# Patient Record
Sex: Female | Born: 1950
Health system: Southern US, Community
[De-identification: ages and names within clinical notes are randomized; demographics above are authoritative.]

## PROBLEM LIST (undated history)

## (undated) DIAGNOSIS — I10 Essential (primary) hypertension: Secondary | ICD-10-CM

## (undated) DIAGNOSIS — N6009 Solitary cyst of unspecified breast: Secondary | ICD-10-CM

## (undated) DIAGNOSIS — Z720 Tobacco use: Secondary | ICD-10-CM

## (undated) DIAGNOSIS — R011 Cardiac murmur, unspecified: Secondary | ICD-10-CM

## (undated) DIAGNOSIS — L401 Generalized pustular psoriasis: Secondary | ICD-10-CM

## (undated) DIAGNOSIS — Z8042 Family history of malignant neoplasm of prostate: Secondary | ICD-10-CM

## (undated) DIAGNOSIS — H409 Unspecified glaucoma: Secondary | ICD-10-CM

## (undated) DIAGNOSIS — Z803 Family history of malignant neoplasm of breast: Secondary | ICD-10-CM

## (undated) DIAGNOSIS — E785 Hyperlipidemia, unspecified: Secondary | ICD-10-CM

## (undated) DIAGNOSIS — D496 Neoplasm of unspecified behavior of brain: Secondary | ICD-10-CM

## (undated) DIAGNOSIS — E042 Nontoxic multinodular goiter: Secondary | ICD-10-CM

## (undated) DIAGNOSIS — I739 Peripheral vascular disease, unspecified: Secondary | ICD-10-CM

## (undated) DIAGNOSIS — Z801 Family history of malignant neoplasm of trachea, bronchus and lung: Secondary | ICD-10-CM

## (undated) DIAGNOSIS — M1990 Unspecified osteoarthritis, unspecified site: Secondary | ICD-10-CM

## (undated) DIAGNOSIS — Z8051 Family history of malignant neoplasm of kidney: Secondary | ICD-10-CM

## (undated) DIAGNOSIS — D689 Coagulation defect, unspecified: Secondary | ICD-10-CM

## (undated) DIAGNOSIS — T7840XA Allergy, unspecified, initial encounter: Secondary | ICD-10-CM

## (undated) DIAGNOSIS — Z8489 Family history of other specified conditions: Secondary | ICD-10-CM

## (undated) DIAGNOSIS — F329 Major depressive disorder, single episode, unspecified: Secondary | ICD-10-CM

## (undated) DIAGNOSIS — H269 Unspecified cataract: Secondary | ICD-10-CM

## (undated) DIAGNOSIS — K219 Gastro-esophageal reflux disease without esophagitis: Secondary | ICD-10-CM

## (undated) DIAGNOSIS — Z800 Family history of malignant neoplasm of digestive organs: Secondary | ICD-10-CM

## (undated) DIAGNOSIS — Z90711 Acquired absence of uterus with remaining cervical stump: Secondary | ICD-10-CM

## (undated) DIAGNOSIS — Z1509 Genetic susceptibility to other malignant neoplasm: Secondary | ICD-10-CM

## (undated) DIAGNOSIS — I2510 Atherosclerotic heart disease of native coronary artery without angina pectoris: Secondary | ICD-10-CM

## (undated) DIAGNOSIS — Z8049 Family history of malignant neoplasm of other genital organs: Secondary | ICD-10-CM

## (undated) HISTORY — DX: Solitary cyst of unspecified breast: N60.09

## (undated) HISTORY — DX: Cardiac murmur, unspecified: R01.1

## (undated) HISTORY — DX: Genetic susceptibility to other malignant neoplasm: Z15.09

## (undated) HISTORY — DX: Family history of malignant neoplasm of digestive organs: Z80.0

## (undated) HISTORY — DX: Hyperlipidemia, unspecified: E78.5

## (undated) HISTORY — DX: Family history of malignant neoplasm of trachea, bronchus and lung: Z80.1

## (undated) HISTORY — DX: Gastro-esophageal reflux disease without esophagitis: K21.9

## (undated) HISTORY — PX: ABDOMINAL HYSTERECTOMY: SHX81

## (undated) HISTORY — DX: Generalized pustular psoriasis: L40.1

## (undated) HISTORY — DX: Allergy, unspecified, initial encounter: T78.40XA

## (undated) HISTORY — DX: Major depressive disorder, single episode, unspecified: F32.9

## (undated) HISTORY — DX: Family history of malignant neoplasm of other genital organs: Z80.49

## (undated) HISTORY — PX: EYE SURGERY: SHX253

## (undated) HISTORY — DX: Coagulation defect, unspecified: D68.9

## (undated) HISTORY — PX: COLONOSCOPY: SHX174

## (undated) HISTORY — DX: Family history of malignant neoplasm of prostate: Z80.42

## (undated) HISTORY — PX: UPPER GASTROINTESTINAL ENDOSCOPY: SHX188

## (undated) HISTORY — DX: Unspecified cataract: H26.9

## (undated) HISTORY — DX: Family history of malignant neoplasm of kidney: Z80.51

## (undated) HISTORY — DX: Essential (primary) hypertension: I10

## (undated) HISTORY — DX: Peripheral vascular disease, unspecified: I73.9

## (undated) HISTORY — DX: Unspecified glaucoma: H40.9

## (undated) HISTORY — DX: Nontoxic multinodular goiter: E04.2

## (undated) HISTORY — PX: CARDIAC CATHETERIZATION: SHX172

## (undated) HISTORY — DX: Family history of malignant neoplasm of breast: Z80.3

## (undated) HISTORY — DX: Unspecified osteoarthritis, unspecified site: M19.90

## (undated) HISTORY — DX: Tobacco use: Z72.0

## (undated) HISTORY — DX: Acquired absence of uterus with remaining cervical stump: Z90.711

## (undated) HISTORY — DX: Neoplasm of unspecified behavior of brain: D49.6

---

## 1983-03-20 HISTORY — PX: BREAST BIOPSY: SHX20

## 1984-03-19 HISTORY — PX: SUPRACERVICAL ABDOMINAL HYSTERECTOMY: SHX5393

## 2003-02-01 ENCOUNTER — Other Ambulatory Visit: Admission: RE | Admit: 2003-02-01 | Discharge: 2003-02-01 | Payer: Self-pay | Admitting: *Deleted

## 2004-03-19 DIAGNOSIS — F32A Depression, unspecified: Secondary | ICD-10-CM

## 2004-03-19 HISTORY — DX: Depression, unspecified: F32.A

## 2004-03-31 ENCOUNTER — Other Ambulatory Visit: Admission: RE | Admit: 2004-03-31 | Discharge: 2004-03-31 | Payer: Self-pay | Admitting: *Deleted

## 2005-03-19 DIAGNOSIS — N6009 Solitary cyst of unspecified breast: Secondary | ICD-10-CM

## 2005-03-19 HISTORY — DX: Solitary cyst of unspecified breast: N60.09

## 2005-09-10 ENCOUNTER — Other Ambulatory Visit: Admission: RE | Admit: 2005-09-10 | Discharge: 2005-09-10 | Payer: Self-pay | Admitting: Obstetrics & Gynecology

## 2005-12-18 ENCOUNTER — Encounter: Admission: RE | Admit: 2005-12-18 | Discharge: 2005-12-18 | Payer: Self-pay | Admitting: Radiology

## 2007-04-24 ENCOUNTER — Encounter: Admission: RE | Admit: 2007-04-24 | Discharge: 2007-04-24 | Payer: Self-pay | Admitting: Family Medicine

## 2007-09-15 ENCOUNTER — Other Ambulatory Visit: Admission: RE | Admit: 2007-09-15 | Discharge: 2007-09-15 | Payer: Self-pay | Admitting: Internal Medicine

## 2009-01-29 ENCOUNTER — Emergency Department (HOSPITAL_COMMUNITY): Admission: EM | Admit: 2009-01-29 | Discharge: 2009-01-30 | Payer: Self-pay | Admitting: Emergency Medicine

## 2010-06-21 LAB — COMPREHENSIVE METABOLIC PANEL
ALT: 28 U/L (ref 0–35)
Alkaline Phosphatase: 53 U/L (ref 39–117)
CO2: 26 mEq/L (ref 19–32)
Calcium: 9.5 mg/dL (ref 8.4–10.5)
GFR calc non Af Amer: >60 mL/min (ref >60)
Glucose, Bld: 105 mg/dL — ABNORMAL HIGH (ref 70–99)
Sodium: 138 mEq/L (ref 135–145)
Total Bilirubin: 0.5 mg/dL (ref 0.3–1.2)

## 2010-06-21 LAB — CBC
Hemoglobin: 13.1 g/dL (ref 12.0–15.0)
MCHC: 33.6 g/dL (ref 30.0–36.0)
RBC: 4.44 MIL/uL (ref 3.87–5.11)

## 2010-06-21 LAB — DIFFERENTIAL
Basophils Absolute: 0.1 10*3/uL (ref 0.0–0.1)
Basophils Relative: 2 % — ABNORMAL HIGH (ref 0–1)
Eosinophils Absolute: 0.1 10*3/uL (ref 0.0–0.7)
Lymphs Abs: 3.6 10*3/uL (ref 0.7–4.0)
Neutrophils Relative %: 39 % — ABNORMAL LOW (ref 43–77)

## 2010-06-21 LAB — POCT CARDIAC MARKERS: Myoglobin, poc: 165 ng/mL (ref 12–200)

## 2010-11-07 ENCOUNTER — Encounter: Payer: Self-pay | Admitting: Pulmonary Disease

## 2010-11-08 ENCOUNTER — Encounter: Payer: Self-pay | Admitting: Pulmonary Disease

## 2010-11-08 ENCOUNTER — Ambulatory Visit (INDEPENDENT_AMBULATORY_CARE_PROVIDER_SITE_OTHER)
Admission: RE | Admit: 2010-11-08 | Discharge: 2010-11-08 | Disposition: A | Discharge status: Home / Self Care | Payer: BC Managed Care – PPO | Source: Ambulatory Visit | Attending: Pulmonary Disease | Admitting: Pulmonary Disease

## 2010-11-08 ENCOUNTER — Ambulatory Visit (INDEPENDENT_AMBULATORY_CARE_PROVIDER_SITE_OTHER): Payer: BC Managed Care – PPO | Admitting: Pulmonary Disease

## 2010-11-08 VITALS — BP 112/70 | HR 85 | Temp 98.2°F | Ht 67.0 in | Wt 220.0 lb

## 2010-11-08 DIAGNOSIS — R0609 Other forms of dyspnea: Secondary | ICD-10-CM

## 2010-11-08 DIAGNOSIS — R0600 Dyspnea, unspecified: Secondary | ICD-10-CM

## 2010-11-08 DIAGNOSIS — R0989 Other specified symptoms and signs involving the circulatory and respiratory systems: Secondary | ICD-10-CM

## 2010-11-08 NOTE — Assessment & Plan Note (Signed)
The patient is complaining of dyspnea on exertion with heavier exertional activities, and also when she rushes.  She has a long history of smoking, and therefore may have an element of COPD.  She also has gained 20 pounds over the last one year, and this certainly can be a contributing factor.  She denies any history of heart disease.  At this point, I would like to check a plain chest x-ray, as well as schedule her for pulmonary function studies.  I have also encouraged her to work aggressively on weight loss, and to stop smoking.

## 2010-11-08 NOTE — Patient Instructions (Signed)
Will check cxr today, and call you with results. Will schedule for breathing tests, and see you back on same day to review Stop smoking, work on weight loss.

## 2010-11-08 NOTE — Progress Notes (Signed)
  Subjective:    Patient ID: Pamela Lowe, female    DOB: [birth year], 60 y.o.   MRN: [id number]  HPI The patient is a 60 year old female who I've been asked to see for dyspnea on exertion.  Patient states that she has had worsening shortness of breath over the last 2 months, and this primarily occurs with heavier activities or when she rushes.  She describes a 4 block dyspnea on exertion at a moderate pace on flat ground, but we'll not get winded bringing groceries in from the car or walking up one flight of stairs.  She does not get winded doing light housework.  She denies any significant cough or chest congestion, but does note a lot of postnasal drip and nasal symptoms.  She denies having lower extremity edema.  She does note that her weight is up 20 pounds over the last one year.  She has not had a recent chest x-ray, nor has she had pulmonary function studies.  She does have a long history of smoking and continues to do so.   Review of Systems  Constitutional: Negative for fever and unexpected weight change.  HENT: Positive for congestion. Negative for ear pain, nosebleeds, sore throat, rhinorrhea, sneezing, trouble swallowing, dental problem, postnasal drip and sinus pressure.   Eyes: Negative for redness and itching.  Respiratory: Positive for shortness of breath. Negative for cough, chest tightness and wheezing.   Cardiovascular: Negative for palpitations and leg swelling.  Gastrointestinal: Positive for abdominal pain. Negative for nausea and vomiting.  Genitourinary: Negative for dysuria.  Musculoskeletal: Positive for joint swelling.  Skin: Negative for rash.  Neurological: Positive for headaches.  Hematological: Does not bruise/bleed easily.  Psychiatric/Behavioral: Negative for dysphoric mood. The patient is not nervous/anxious.        Objective:   Physical Exam Constitutional:  Overweight female, no acute distress  HENT:  Nares patent without discharge  Oropharynx without  exudate, palate and uvula are moderately elongated  Eyes:  Perrla, eomi, no scleral icterus  Neck:  No JVD, no TMG  Cardiovascular:  Normal rate, regular rhythm, no rubs or gallops.  No murmurs        Intact distal pulses  Pulmonary :  Normal breath sounds, no stridor or respiratory distress   No rales, rhonchi, or wheezing  Abdominal:  Soft, nondistended, bowel sounds present.  No tenderness noted.   Musculoskeletal:  No lower extremity edema noted.  Lymph Nodes:  No cervical lymphadenopathy noted  Skin:  No cyanosis noted  Neurologic:  Alert, appropriate, moves all 4 extremities without obvious deficit.         Assessment & Plan:

## 2010-11-13 ENCOUNTER — Telehealth: Payer: Self-pay | Admitting: Pulmonary Disease

## 2010-11-13 NOTE — Telephone Encounter (Signed)
Called and spoke with pt's husband.  Husband aware of cxr results.  He stated he would relay message to pt.

## 2010-11-17 ENCOUNTER — Ambulatory Visit (INDEPENDENT_AMBULATORY_CARE_PROVIDER_SITE_OTHER): Payer: BC Managed Care – PPO | Admitting: Pulmonary Disease

## 2010-11-17 DIAGNOSIS — R0989 Other specified symptoms and signs involving the circulatory and respiratory systems: Secondary | ICD-10-CM

## 2010-11-17 DIAGNOSIS — R0600 Dyspnea, unspecified: Secondary | ICD-10-CM

## 2010-11-17 DIAGNOSIS — R0609 Other forms of dyspnea: Secondary | ICD-10-CM

## 2010-11-17 LAB — PULMONARY FUNCTION TEST

## 2010-11-17 NOTE — Progress Notes (Signed)
PFT done today. 

## 2010-11-22 ENCOUNTER — Encounter: Payer: Self-pay | Admitting: Pulmonary Disease

## 2010-11-22 ENCOUNTER — Ambulatory Visit (INDEPENDENT_AMBULATORY_CARE_PROVIDER_SITE_OTHER): Payer: BC Managed Care – PPO | Admitting: Pulmonary Disease

## 2010-11-22 VITALS — BP 104/66 | HR 68 | Temp 98.1°F | Ht 67.0 in | Wt 220.4 lb

## 2010-11-22 DIAGNOSIS — R0989 Other specified symptoms and signs involving the circulatory and respiratory systems: Secondary | ICD-10-CM

## 2010-11-22 DIAGNOSIS — R0609 Other forms of dyspnea: Secondary | ICD-10-CM

## 2010-11-22 DIAGNOSIS — R0600 Dyspnea, unspecified: Secondary | ICD-10-CM

## 2010-11-22 NOTE — Patient Instructions (Signed)
Work on smoking cessation Work on [employer] reduction and some type of exercise program If you are not improving with the above lifestyle change, or if you feel breathing is worsening, please call me.

## 2010-11-22 NOTE — Assessment & Plan Note (Signed)
The patient really has minimal airflow obstruction by pulmonary function studies, and I suspect this is more likely due to ongoing airway inflammation from her smoking.  It is really unclear whether she would benefit from a trial with a bronchodilator regimen, but I would be willing to do so over a four-week period.  I have stressed however that it is essential that she stop smoking.  The patient feels that she would rather stop smoking and hold off on a trial with medication.  I have also recommended weight reduction and an exercise program, and the patient states that she is committed to this.  She understands that I cannot exclude a cardiac issue, and that she may need further workup if she does not see improvement over the next 3 months with her lifestyle changes.

## 2010-11-22 NOTE — Progress Notes (Signed)
  Subjective:    Patient ID: Pamela Lowe, female    DOB: May 25, 1950, 60 y.o.   MRN: [id number]  HPI The patient comes in today for followup of her recent pulmonary function studies, ordered as part of a workup for dyspnea on exertion.  The patient was found to have a normal FVC and FEV1, as well as a normal FEV1 percent.  She did have very subtle airflow obstruction on her flow volume loop, and also very mild air trapping on lung volumes.  She had no evidence for restriction, and her DLCO was mildly decreased at 72% but corrected with alveolar volume adjustment.  I have reviewed the study with her in detail, and have answered all of her questions.   Review of Systems  Constitutional: Negative for fever and unexpected weight change.  HENT: Positive for sinus pressure. Negative for ear pain, nosebleeds, congestion, sore throat, rhinorrhea, sneezing, trouble swallowing, dental problem and postnasal drip.   Eyes: Negative for redness and itching.  Respiratory: Positive for shortness of breath. Negative for cough, chest tightness and wheezing.   Cardiovascular: Negative for palpitations and leg swelling.  Gastrointestinal: Negative for nausea and vomiting.  Genitourinary: Positive for urgency. Negative for dysuria.  Musculoskeletal: Negative for joint swelling.  Skin: Negative for rash.  Neurological: Negative for headaches.  Hematological: Does not bruise/bleed easily.  Psychiatric/Behavioral: Negative for dysphoric mood. The patient is not nervous/anxious.        Objective:   Physical Exam Obese female in no acute distress Naris without discharge or purulence Lower extremities without significant edema, no cyanosis noted Alert and oriented, moves all 4 extremities.       Assessment & Plan:

## 2012-01-21 ENCOUNTER — Encounter (INDEPENDENT_AMBULATORY_CARE_PROVIDER_SITE_OTHER): Payer: BC Managed Care – PPO | Admitting: Ophthalmology

## 2012-01-21 DIAGNOSIS — H35039 Hypertensive retinopathy, unspecified eye: Secondary | ICD-10-CM

## 2012-01-21 DIAGNOSIS — I10 Essential (primary) hypertension: Secondary | ICD-10-CM

## 2012-01-21 DIAGNOSIS — H43819 Vitreous degeneration, unspecified eye: Secondary | ICD-10-CM

## 2012-01-21 DIAGNOSIS — H2510 Age-related nuclear cataract, unspecified eye: Secondary | ICD-10-CM

## 2012-01-21 DIAGNOSIS — H3540 Unspecified peripheral retinal degeneration: Secondary | ICD-10-CM

## 2012-02-29 ENCOUNTER — Encounter (INDEPENDENT_AMBULATORY_CARE_PROVIDER_SITE_OTHER): Payer: BC Managed Care – PPO | Admitting: Ophthalmology

## 2012-02-29 DIAGNOSIS — H43819 Vitreous degeneration, unspecified eye: Secondary | ICD-10-CM

## 2012-02-29 DIAGNOSIS — H3540 Unspecified peripheral retinal degeneration: Secondary | ICD-10-CM

## 2012-02-29 DIAGNOSIS — H35039 Hypertensive retinopathy, unspecified eye: Secondary | ICD-10-CM

## 2012-02-29 DIAGNOSIS — H2510 Age-related nuclear cataract, unspecified eye: Secondary | ICD-10-CM

## 2012-02-29 DIAGNOSIS — I10 Essential (primary) hypertension: Secondary | ICD-10-CM

## 2012-12-13 ENCOUNTER — Emergency Department (HOSPITAL_COMMUNITY)
Admission: EM | Admit: 2012-12-13 | Discharge: 2012-12-13 | Disposition: A | Discharge status: Home / Self Care | Payer: BC Managed Care – PPO | Attending: Emergency Medicine | Admitting: Emergency Medicine

## 2012-12-13 ENCOUNTER — Encounter (HOSPITAL_COMMUNITY): Payer: Self-pay

## 2012-12-13 ENCOUNTER — Emergency Department (HOSPITAL_COMMUNITY): Payer: BC Managed Care – PPO

## 2012-12-13 DIAGNOSIS — F17200 Nicotine dependence, unspecified, uncomplicated: Secondary | ICD-10-CM | POA: Insufficient documentation

## 2012-12-13 DIAGNOSIS — I10 Essential (primary) hypertension: Secondary | ICD-10-CM | POA: Insufficient documentation

## 2012-12-13 DIAGNOSIS — Z8742 Personal history of other diseases of the female genital tract: Secondary | ICD-10-CM | POA: Insufficient documentation

## 2012-12-13 DIAGNOSIS — K219 Gastro-esophageal reflux disease without esophagitis: Secondary | ICD-10-CM | POA: Insufficient documentation

## 2012-12-13 DIAGNOSIS — E785 Hyperlipidemia, unspecified: Secondary | ICD-10-CM | POA: Insufficient documentation

## 2012-12-13 DIAGNOSIS — Z8639 Personal history of other endocrine, nutritional and metabolic disease: Secondary | ICD-10-CM | POA: Insufficient documentation

## 2012-12-13 DIAGNOSIS — M545 Low back pain, unspecified: Secondary | ICD-10-CM | POA: Insufficient documentation

## 2012-12-13 DIAGNOSIS — Z90710 Acquired absence of both cervix and uterus: Secondary | ICD-10-CM | POA: Insufficient documentation

## 2012-12-13 DIAGNOSIS — Z862 Personal history of diseases of the blood and blood-forming organs and certain disorders involving the immune mechanism: Secondary | ICD-10-CM | POA: Insufficient documentation

## 2012-12-13 DIAGNOSIS — Z79899 Other long term (current) drug therapy: Secondary | ICD-10-CM | POA: Insufficient documentation

## 2012-12-13 DIAGNOSIS — Z8709 Personal history of other diseases of the respiratory system: Secondary | ICD-10-CM | POA: Insufficient documentation

## 2012-12-13 DIAGNOSIS — Z8659 Personal history of other mental and behavioral disorders: Secondary | ICD-10-CM | POA: Insufficient documentation

## 2012-12-13 LAB — WET PREP, GENITAL
Trich, Wet Prep: NONE SEEN
Yeast Wet Prep HPF POC: NONE SEEN

## 2012-12-13 LAB — CBC
MCH: 29.2 pg (ref 26.0–34.0)
MCHC: 34 g/dL (ref 30.0–36.0)
RDW: 13.4 % (ref 11.5–15.5)

## 2012-12-13 LAB — BASIC METABOLIC PANEL
Calcium: 9.5 mg/dL (ref 8.4–10.5)
GFR calc Af Amer: 53 mL/min — ABNORMAL LOW (ref >90)
GFR calc non Af Amer: 46 mL/min — ABNORMAL LOW (ref >90)
Potassium: 3.9 mEq/L (ref 3.5–5.1)
Sodium: 136 mEq/L (ref 135–145)

## 2012-12-13 LAB — URINALYSIS, ROUTINE W REFLEX MICROSCOPIC
Hgb urine dipstick: NEGATIVE
Protein, ur: NEGATIVE mg/dL
Specific Gravity, Urine: 1.018 (ref 1.005–1.030)
Urobilinogen, UA: 0.2 mg/dL (ref 0.0–1.0)

## 2012-12-13 LAB — HEPATIC FUNCTION PANEL
AST: 20 U/L (ref 0–37)
Albumin: 3.5 g/dL (ref 3.5–5.2)
Bilirubin, Direct: 0.1 mg/dL (ref 0.0–0.3)

## 2012-12-13 LAB — URINE MICROSCOPIC-ADD ON

## 2012-12-13 LAB — LIPASE, BLOOD: Lipase: 11 U/L (ref 11–59)

## 2012-12-13 MED ORDER — DIAZEPAM 5 MG PO TABS: 5.0000 mg | ORAL_TABLET | Freq: Four times a day (QID) | ORAL | Status: DC | PRN

## 2012-12-13 MED ORDER — SODIUM CHLORIDE 0.9 % IV BOLUS (SEPSIS)
1000.0000 mL | Freq: Once | INTRAVENOUS | Status: AC
  Administered 2012-12-13: 1000 mL via INTRAVENOUS

## 2012-12-13 MED ORDER — HYDROCODONE-ACETAMINOPHEN 5-325 MG PO TABS: 1.0000 | ORAL_TABLET | Freq: Four times a day (QID) | ORAL | Status: DC | PRN

## 2012-12-13 NOTE — ED Notes (Signed)
Pt c/o r side flank pain that radiates into R abdomen and R groin.  Pain score 6/10 and sts pain increases with movement.  Denies dysuria and hematuria.  Pt was seen at Urgent Care on Wednesday and diagnosed with a UTI.  Sts no relief with Bactrim.

## 2012-12-13 NOTE — ED Provider Notes (Signed)
CSN: [id number]     Arrival date & time 12/13/12  2536 History   First MD Initiated Contact with Patient 12/13/12 1021     Chief Complaint  Patient presents with  . Flank Pain   (Consider location/radiation/quality/duration/timing/severity/associated sxs/prior Treatment) HPI Comments: Seen at Urgent Care 3 days ago - diagnosed with UTI. Was not having urinary symptoms at that time. Given Bactrim. After initiation of bactrim, worsening R flank pain with radiation to groin. Intermittent, ok when still, worse with movements. No fevers, nausea, vomiting, diarrhea. Normal bowel movements. No vaginal bleeding. States the pain feels similar to prior menstrual cramps - hx of hysterectomy previously.  Patient is a 62 y.o. female presenting with flank pain. The history is provided by the patient.  Flank Pain This is a new problem. The current episode started more than 2 days ago. The problem occurs hourly. The problem has been gradually worsening. Associated symptoms include abdominal pain. Pertinent negatives include no chest pain, no headaches and no shortness of breath. The symptoms are aggravated by twisting (movement). Nothing relieves the symptoms. She has tried nothing for the symptoms.    Past Medical History  Diagnosis Date  . Anemia   . Hyperlipidemia   . Breast cyst 2007    Right  . GERD (gastroesophageal reflux disease)   . Tobacco user   . Depression 2006    w/ menopause  . Hypertension   . Allergic rhinitis   . Multinodular goiter    Past Surgical History  Procedure Laterality Date  . Total abdominal hysterectomy  1986  . Breast biopsy     Family History  Problem Relation Age of Onset  . Kidney cancer Father   . Pneumonia Father   . Hypertension Mother   . Deep vein thrombosis Mother   . Alzheimer's disease Mother   . Heart attack Maternal Grandfather   . Breast cancer Sister   . Allergies Brother   . Allergies Sister   . Allergies Sister   . Heart attack Brother    . Clotting disorder Sister   . Cancer Other     3 paternal uncles: stomach, brain, and colon CA  . Cancer Other     3 paternal aunts: colon, breast, and unsure   History  Substance Use Topics  . Smoking status: Current Every Day Smoker -- 0.50 packs/day for 30 years    Types: Cigarettes  . Smokeless tobacco: Never Used  . Alcohol Use: Yes     Comment: occ   OB History   Grav Para Term Preterm Abortions TAB SAB Ect Mult Living                 Review of Systems  Constitutional: Negative for fever.  Respiratory: Negative for shortness of breath.   Cardiovascular: Negative for chest pain.  Gastrointestinal: Positive for abdominal pain. Negative for nausea, vomiting and diarrhea.  Genitourinary: Positive for flank pain. Negative for dysuria, urgency, hematuria, decreased urine volume, vaginal bleeding, vaginal discharge and vaginal pain.  Skin: Negative for rash.  Neurological: Negative for headaches.  All other systems reviewed and are negative.    Allergies  Wellbutrin  Home Medications   Current Outpatient Rx  Name  Route  Sig  Dispense  Refill  . benazepril-hydrochlorthiazide (LOTENSIN HCT) 20-12.5 MG per tablet   Oral   Take 1 tablet by mouth daily.         [name] estradiol (ESTRACE) 0.5 MG tablet   Oral   Take 0.5 mg  by mouth daily.           . hydrochlorothiazide (,MICROZIDE/HYDRODIURIL,) 12.5 MG capsule   Oral   Take 12.5 mg by mouth daily.           [name] omeprazole (PRILOSEC) 20 MG capsule   Oral   Take 20 mg by mouth daily as needed (for heartburn).         . pravastatin (PRAVACHOL) 40 MG tablet   Oral   Take 40 mg by mouth every evening.          . sulfamethoxazole-trimethoprim (BACTRIM DS) 800-160 MG per tablet   Oral   Take 1 tablet by mouth 2 (two) times daily. 10 day course of therapy started 12/10/12          BP 129/81  Pulse 83  Temp(Src) 98.7 F (37.1 C) (Oral)  Resp 16  SpO2 99% Physical Exam  Nursing note and vitals  reviewed. Constitutional: She is oriented to person, place, and time. She appears well-developed and well-nourished. No distress.  HENT:  Head: Normocephalic and atraumatic.  Eyes: EOM are normal. Pupils are equal, round, and reactive to light.  Neck: Normal range of motion. Neck supple.  Cardiovascular: Normal rate and regular rhythm.  Exam reveals no friction rub.   No murmur heard. Pulmonary/Chest: Effort normal and breath sounds normal. No respiratory distress. She has no wheezes. She has no rales.  Abdominal: Soft. She exhibits no distension. There is no tenderness. There is no rebound and no guarding.  Musculoskeletal: Normal range of motion. She exhibits no edema.  Neurological: She is alert and oriented to person, place, and time.  Skin: She is not diaphoretic.    ED Course  Procedures (including critical care time) Labs Review Labs Reviewed  WET PREP, GENITAL - Abnormal; Notable for the following:    WBC, Wet Prep HPF POC FEW (*)    All other components within normal limits  BASIC METABOLIC PANEL - Abnormal; Notable for the following:    Creatinine, Ser 1.24 (*)    GFR calc non Af Amer 46 (*)    GFR calc Af Amer 53 (*)    All other components within normal limits  URINALYSIS, ROUTINE W REFLEX MICROSCOPIC - Abnormal; Notable for the following:    Leukocytes, UA TRACE (*)    All other components within normal limits  GC/CHLAMYDIA PROBE AMP  CBC  HEPATIC FUNCTION PANEL  LIPASE, BLOOD  URINE MICROSCOPIC-ADD ON   Imaging Review Ct Abdomen Pelvis Wo Contrast  12/13/2012   CLINICAL DATA:  Right flank and groin pain. Urinary tract infection.  EXAM: CT ABDOMEN AND PELVIS WITHOUT CONTRAST  TECHNIQUE: Multidetector CT imaging of the abdomen and pelvis was performed following the standard protocol without intravenous contrast.  COMPARISON:  None.  FINDINGS: No evidence of renal calculi or hydronephrosis. No evidence of ureteral calculi or dilatation. No bladder calculi identified.   The other abdominal parenchymal organs are unremarkable in appearance on this noncontrast study. Gallbladder is unremarkable. No soft tissue masses or lymphadenopathy identified. Uterus and adnexal regions are unremarkable. No evidence of inflammatory process or abnormal fluid collections. No evidence of dilated bowel loops or hernia. Atherosclerotic calcification of abdominal aorta seen, without evidence of aneurysm.  IMPRESSION: No evidence of urolithiasis, hydronephrosis, or other acute findings.   Electronically Signed   By: Myles Rosenthal   On: 12/13/2012 11:22    MDM   1. Low back pain    87F with hx of HLD, GERD, HTN  presents with gradually worsening back pain. No injury. Recently diagnosed with UTI at Urgent Care - had no urinary symptoms at that time, continues to not have any urinary symptoms. No fevers, vomiting, diarrhea. Mild lower abdominal pain, similar to cramping, radiating from R lower back. States pain worsened after starting bactrim.  Here, AFVSS, well appearing, non-toxic. No CVA tenderness, no palpable abdominal pain on exam, no rebound or guarding. Concern for possible kidney stone with radiation of pain and gradual worsening. No systemic symptoms, clinical picture doesn't fit appendicitis. Pain with movement points towards musculoskeletal also. CT reviewed by me, no evidence of acute disease. No kidney stones. It is a non-con study.  I spoke with patient about obtaining a contrasted study since we couldn't evaluate for infection. Despite no fevers and normal white count, still having pain. I ordered a contrasted study, but Dr. Lyman Bishop from Radiology called and stated everything appeared well on the scan and he doesn't feel a contrast study would help. She doesn't have appendicitis or diverticulitis based on this scan, so no need for further imaging. Patient comfortable with this plan. Had normal pelvic, no need for [place]. Likely musculoskeletal pain as it mainly comes when she moves.  Will give small amount of pain medicine and muscle relaxer. Instructed to f/u with PCP and to continue bactrim that was previously prescribed for UTI.  I have reviewed all labs and imaging and considered them in my medical decision making.   Dagmar Hait, MD 12/13/12 1318

## 2012-12-14 ENCOUNTER — Encounter (HOSPITAL_COMMUNITY): Payer: Self-pay | Admitting: *Deleted

## 2012-12-14 ENCOUNTER — Emergency Department (HOSPITAL_COMMUNITY)
Admission: EM | Admit: 2012-12-14 | Discharge: 2012-12-14 | Disposition: A | Discharge status: Home / Self Care | Payer: BC Managed Care – PPO | Attending: Emergency Medicine | Admitting: Emergency Medicine

## 2012-12-14 ENCOUNTER — Emergency Department (HOSPITAL_COMMUNITY): Payer: BC Managed Care – PPO

## 2012-12-14 DIAGNOSIS — E785 Hyperlipidemia, unspecified: Secondary | ICD-10-CM | POA: Insufficient documentation

## 2012-12-14 DIAGNOSIS — Z8709 Personal history of other diseases of the respiratory system: Secondary | ICD-10-CM | POA: Insufficient documentation

## 2012-12-14 DIAGNOSIS — Z8719 Personal history of other diseases of the digestive system: Secondary | ICD-10-CM | POA: Insufficient documentation

## 2012-12-14 DIAGNOSIS — M549 Dorsalgia, unspecified: Secondary | ICD-10-CM | POA: Insufficient documentation

## 2012-12-14 DIAGNOSIS — F17200 Nicotine dependence, unspecified, uncomplicated: Secondary | ICD-10-CM | POA: Insufficient documentation

## 2012-12-14 DIAGNOSIS — N390 Urinary tract infection, site not specified: Secondary | ICD-10-CM | POA: Insufficient documentation

## 2012-12-14 DIAGNOSIS — Z8659 Personal history of other mental and behavioral disorders: Secondary | ICD-10-CM | POA: Insufficient documentation

## 2012-12-14 DIAGNOSIS — Z79899 Other long term (current) drug therapy: Secondary | ICD-10-CM | POA: Insufficient documentation

## 2012-12-14 DIAGNOSIS — I10 Essential (primary) hypertension: Secondary | ICD-10-CM | POA: Insufficient documentation

## 2012-12-14 DIAGNOSIS — Z862 Personal history of diseases of the blood and blood-forming organs and certain disorders involving the immune mechanism: Secondary | ICD-10-CM | POA: Insufficient documentation

## 2012-12-14 DIAGNOSIS — Z90710 Acquired absence of both cervix and uterus: Secondary | ICD-10-CM | POA: Insufficient documentation

## 2012-12-14 DIAGNOSIS — Z792 Long term (current) use of antibiotics: Secondary | ICD-10-CM | POA: Insufficient documentation

## 2012-12-14 LAB — COMPREHENSIVE METABOLIC PANEL
ALT: 20 U/L (ref 0–35)
BUN: 11 mg/dL (ref 6–23)
CO2: 24 mEq/L (ref 19–32)
Calcium: 9.6 mg/dL (ref 8.4–10.5)
Creatinine, Ser: 1.15 mg/dL — ABNORMAL HIGH (ref 0.50–1.10)
GFR calc Af Amer: 58 mL/min — ABNORMAL LOW (ref >90)
GFR calc non Af Amer: 50 mL/min — ABNORMAL LOW (ref >90)
Glucose, Bld: 87 mg/dL (ref 70–99)
Sodium: 135 mEq/L (ref 135–145)
Total Bilirubin: 0.6 mg/dL (ref 0.3–1.2)
Total Protein: 7.8 g/dL (ref 6.0–8.3)

## 2012-12-14 LAB — GC/CHLAMYDIA PROBE AMP: CT Probe RNA: NEGATIVE

## 2012-12-14 LAB — CBC WITH DIFFERENTIAL/PLATELET
Eosinophils Relative: 4 % (ref 0–5)
HCT: 42.3 % (ref 36.0–46.0)
Hemoglobin: 14.6 g/dL (ref 12.0–15.0)
Lymphocytes Relative: 9 % — ABNORMAL LOW (ref 12–46)
Lymphs Abs: 0.6 10*3/uL — ABNORMAL LOW (ref 0.7–4.0)
MCV: 85.8 fL (ref 78.0–100.0)
Monocytes Absolute: 0.3 10*3/uL (ref 0.1–1.0)
Monocytes Relative: 4 % (ref 3–12)
RBC: 4.93 MIL/uL (ref 3.87–5.11)
WBC: 6.7 10*3/uL (ref 4.0–10.5)

## 2012-12-14 LAB — LIPASE, BLOOD: Lipase: 13 U/L (ref 11–59)

## 2012-12-14 MED ORDER — HYDROCODONE-ACETAMINOPHEN 5-325 MG PO TABS
1.0000 | ORAL_TABLET | Freq: Once | ORAL | Status: AC
  Administered 2012-12-14: 1 via ORAL
  Filled 2012-12-14: qty 1

## 2012-12-14 MED ORDER — SODIUM CHLORIDE 0.9 % IV BOLUS (SEPSIS)
1000.0000 mL | Freq: Once | INTRAVENOUS | Status: AC
  Administered 2012-12-14: 1000 mL via INTRAVENOUS

## 2012-12-14 NOTE — ED Notes (Addendum)
Pt reports right sided abdominal pain 8/10, seen in ED yesterday for same. Had CT and pelvic done yesterday, dx with muscular pain. Pt reports she does not believe the pain is muscular and wants a sonogram. Reports nausea today. Denies vomiting or diarrhea.   Pt was talking to her sister and in past pts sister "had an aneurysm with similar pain".

## 2012-12-14 NOTE — ED Notes (Signed)
She has just competed abd. U/s and tells me she feels better.  She thanks [place] for our care.

## 2012-12-14 NOTE — ED Provider Notes (Signed)
CSN: [id number]     Arrival date & time 12/14/12  1050 History   First MD Initiated Contact with Patient 12/14/12 1130     Chief Complaint  Patient presents with  . Abdominal Pain   (Consider location/radiation/quality/duration/timing/severity/associated sxs/prior Treatment) HPI Comments: 62 year old female with one week of right back, flank, and abdominal pain. The patient days that was previously intermittent but now has become constant. The pain is helped by having the oral Norco given yesterday. She was seen here in ED yesterday and had a negative noncontrast CT scan and was told it was a muscular issue. The patient states that he feels the pain is too deep to be a muscular issue and she knows that something is wrong and/or leaking. She is worried about having a aneurysm causing her symptoms as she has had multiple family members that have that. She states the pain is "very severe". She denies any fevers, nausea, vomiting, or diarrhea. Denies any urinary symptoms. She is on Bactrim for a UTI though. States that since she was given no contrast is today she would feel a lot better she had a CT with contrast and an ultrasound to rule out all possibilities for her pain.   Past Medical History  Diagnosis Date  . Anemia   . Hyperlipidemia   . Breast cyst 2007    Right  . GERD (gastroesophageal reflux disease)   . Tobacco user   . Depression 2006    w/ menopause  . Hypertension   . Allergic rhinitis   . Multinodular goiter    Past Surgical History  Procedure Laterality Date  . Total abdominal hysterectomy  1986  . Breast biopsy     Family History  Problem Relation Age of Onset  . Kidney cancer Father   . Pneumonia Father   . Hypertension Mother   . Deep vein thrombosis Mother   . Alzheimer's disease Mother   . Heart attack Maternal Grandfather   . Breast cancer Sister   . Allergies Brother   . Allergies Sister   . Allergies Sister   . Heart attack Brother   . Clotting disorder  Sister   . Cancer Other     3 paternal uncles: stomach, brain, and colon CA  . Cancer Other     3 paternal aunts: colon, breast, and unsure   History  Substance Use Topics  . Smoking status: Current Every Day Smoker -- 0.50 packs/day for 30 years    Types: Cigarettes  . Smokeless tobacco: Never Used  . Alcohol Use: Yes     Comment: occ   OB History   Grav Para Term Preterm Abortions TAB SAB Ect Mult Living                 Review of Systems  Constitutional: Negative for fever and chills.  Respiratory: Negative for shortness of breath.   Cardiovascular: Negative for chest pain.  Gastrointestinal: Positive for abdominal pain. Negative for nausea, vomiting, diarrhea and constipation.  Genitourinary: Positive for flank pain. Negative for dysuria and hematuria.  Musculoskeletal: Positive for back pain.  All other systems reviewed and are negative.    Allergies  Wellbutrin  Home Medications   Current Outpatient Rx  Name  Route  Sig  Dispense  Refill  . benazepril-hydrochlorthiazide (LOTENSIN HCT) 20-12.5 MG per tablet   Oral   Take 1 tablet by mouth daily.         . diazepam (VALIUM) 5 MG tablet   Oral  Take 1 tablet (5 mg total) by mouth every 6 (six) hours as needed for anxiety (spasms).   10 tablet   0   . estradiol (ESTRACE) 0.5 MG tablet   Oral   Take 0.5 mg by mouth daily.           . hydrochlorothiazide (,MICROZIDE/HYDRODIURIL,) 12.5 MG capsule   Oral   Take 12.5 mg by mouth daily.           [name] HYDROcodone-acetaminophen (NORCO/VICODIN) 5-325 MG per tablet   Oral   Take 1 tablet by mouth every 6 (six) hours as needed for pain.   15 tablet   0   . pravastatin (PRAVACHOL) 40 MG tablet   Oral   Take 40 mg by mouth every evening.          . sulfamethoxazole-trimethoprim (BACTRIM DS) 800-160 MG per tablet   Oral   Take 1 tablet by mouth 2 (two) times daily. 10 day course of therapy started 12/10/12          BP 113/74  Pulse 93  Temp(Src) 99.3  F (37.4 C) (Oral)  Resp 16  SpO2 99% Physical Exam  Nursing note and vitals reviewed. Constitutional: She is oriented to person, place, and time. She appears well-developed and well-nourished. No distress.  HENT:  Head: Normocephalic and atraumatic.  Right Ear: External ear normal.  Left Ear: External ear normal.  Nose: Nose normal.  Eyes: Right eye exhibits no discharge. Left eye exhibits no discharge.  Cardiovascular: Normal rate, regular rhythm and normal heart sounds.   Pulmonary/Chest: Effort normal and breath sounds normal.  Abdominal: Soft. There is no tenderness.  Mild right flank tenderness  Musculoskeletal:       Lumbar back: She exhibits no tenderness and no bony tenderness.  Neurological: She is alert and oriented to person, place, and time.  Skin: Skin is warm and dry.    ED Course  Procedures (including critical care time) Labs Review Labs Reviewed  CBC WITH DIFFERENTIAL - Abnormal; Notable for the following:    Neutrophils Relative % 84 (*)    Lymphocytes Relative 9 (*)    Lymphs Abs 0.6 (*)    All other components within normal limits  COMPREHENSIVE METABOLIC PANEL - Abnormal; Notable for the following:    Creatinine, Ser 1.15 (*)    GFR calc non Af Amer 50 (*)    GFR calc Af Amer 58 (*)    All other components within normal limits  LIPASE, BLOOD   Imaging Review Ct Abdomen Pelvis Wo Contrast  12/13/2012   CLINICAL DATA:  Right flank and groin pain. Urinary tract infection.  EXAM: CT ABDOMEN AND PELVIS WITHOUT CONTRAST  TECHNIQUE: Multidetector CT imaging of the abdomen and pelvis was performed following the standard protocol without intravenous contrast.  COMPARISON:  None.  FINDINGS: No evidence of renal calculi or hydronephrosis. No evidence of ureteral calculi or dilatation. No bladder calculi identified.  The other abdominal parenchymal organs are unremarkable in appearance on this noncontrast study. Gallbladder is unremarkable. No soft tissue masses or  lymphadenopathy identified. Uterus and adnexal regions are unremarkable. No evidence of inflammatory process or abnormal fluid collections. No evidence of dilated bowel loops or hernia. Atherosclerotic calcification of abdominal aorta seen, without evidence of aneurysm.  IMPRESSION: No evidence of urolithiasis, hydronephrosis, or other acute findings.   Electronically Signed   By: Myles Rosenthal   On: 12/13/2012 11:22    MDM   1. Back pain  Patient's exam is benign. Her pain to be located in her right lateral back/right flank. An ultrasound was obtained that shows no gallbladder or renal pathology. I reassure patient that she's not have any evidence of an aortic aneurysm. Based on the exam, there is no indication for repeat CT scan with contrast. Patient is reassured by this and will followup with her PCP if her pain continues.    Audree Camel, MD 12/14/12 [phone]

## 2012-12-14 NOTE — ED Notes (Signed)
Pt alert and oriented x4. Respirations even and unlabored, bilateral symmetrical rise and fall of chest. Skin warm and dry. In no acute distress. Denies needs.   

## 2012-12-16 ENCOUNTER — Encounter: Payer: Self-pay | Admitting: Medical

## 2012-12-16 ENCOUNTER — Ambulatory Visit (INDEPENDENT_AMBULATORY_CARE_PROVIDER_SITE_OTHER): Payer: BC Managed Care – PPO | Admitting: Medical

## 2012-12-16 VITALS — BP 100/70 | HR 92 | Temp 100.8°F | Resp 16 | Wt 198.0 lb

## 2012-12-16 DIAGNOSIS — R509 Fever, unspecified: Secondary | ICD-10-CM

## 2012-12-16 DIAGNOSIS — R109 Unspecified abdominal pain: Secondary | ICD-10-CM

## 2012-12-16 DIAGNOSIS — R110 Nausea: Secondary | ICD-10-CM

## 2012-12-16 LAB — POCT URINALYSIS DIPSTICK
Bilirubin, UA: NEGATIVE
Glucose, UA: NEGATIVE
Leukocytes, UA: NEGATIVE
Spec Grav, UA: <=1.005
Urobilinogen, UA: NEGATIVE

## 2012-12-16 LAB — COMPREHENSIVE METABOLIC PANEL
ALT: 20 U/L (ref 0–35)
AST: 56 U/L — ABNORMAL HIGH (ref 0–37)
Albumin: 3.7 g/dL (ref 3.5–5.2)
Alkaline Phosphatase: 37 U/L — ABNORMAL LOW (ref 39–117)
BUN: 13 mg/dL (ref 6–23)
Chloride: 96 mEq/L (ref 96–112)
Potassium: 3.6 mEq/L (ref 3.5–5.3)
Sodium: 132 mEq/L — ABNORMAL LOW (ref 135–145)
Total Bilirubin: 0.6 mg/dL (ref 0.3–1.2)
Total Protein: 6.4 g/dL (ref 6.0–8.3)

## 2012-12-16 LAB — CBC WITH DIFFERENTIAL/PLATELET
Basophils Relative: 0 % (ref 0–1)
Eosinophils Absolute: 0.2 10*3/uL (ref 0.0–0.7)
Eosinophils Relative: 3 % (ref 0–5)
Hemoglobin: 13.4 g/dL (ref 12.0–15.0)
Lymphs Abs: 0.9 10*3/uL (ref 0.7–4.0)
MCH: 29.5 pg (ref 26.0–34.0)
MCHC: 35.4 g/dL (ref 30.0–36.0)
MCV: 83.3 fL (ref 78.0–100.0)
Monocytes Relative: 4 % (ref 3–12)
Neutrophils Relative %: 72 % (ref 43–77)
RBC: 4.55 MIL/uL (ref 3.87–5.11)
RDW: 13.3 % (ref 11.5–15.5)

## 2012-12-16 LAB — LIPASE: Lipase: <10 U/L (ref 0–75)

## 2012-12-16 MED ORDER — CIPROFLOXACIN HCL 500 MG PO TABS: 500.0000 mg | ORAL_TABLET | Freq: Two times a day (BID) | ORAL | Status: DC

## 2012-12-16 NOTE — Progress Notes (Signed)
Subjective: Here as a new patient.  Was seeing [facility] prior.  She notes having pains in abdomen and back for 2 weeks.  Started as back pain, in low back.   Then pain radiated around to the "stomach."  Pain worsened over the 2 weeks.  Went to Urgent Care originally, was put on Bactrim for UTI.  Ended up going to ED at [facility] [address] for same, was told to c/t Bactrim.   Been at [facility] all weekend due to pain in abdomen and back.  Had whole series of tests, can't find a cause for her symptoms.  Went to the hospital twice this weekend.  She does note hx/o bad gas in the past.  Has tried gas x with some relief.  Pain is right in the stomach now, not in the back now.  Started to pass gas now more.  Married, no concern for STD.  No other symptoms.  No recent travel, no sick contacts with similar, no suspicion for contaminated food.  Review of Systems Constitutional: +fever, +chills, -sweats, -unexpected weight change,-fatigue ENT: -runny nose, -ear pain, -sore throat Cardiology:  -chest pain, -palpitations, -edema Respiratory: -cough, -shortness of breath, -wheezing Gastroenterology: +abdominal pain, +nausea, -vomiting, -diarrhea, +constipation, no BM in 3 days prior to this morning.  Last BM this morning.  No blood in stool. Hematology: -bleeding or bruising problems Musculoskeletal: -arthralgias, -myalgias, -joint swelling, -back pain Ophthalmology: -vision changes, +some red eyes Urology: -dysuria, -difficulty urinating, -hematuria, +urinary frequency, -urgency Neurology: +mild headache, -weakness, -tingling, -numbness Gyn: no vaginal symptoms.    Objective: Filed Vitals:   12/16/12 1503  BP: 100/70  Pulse: 92  Temp: 100.8 F (38.2 C)  Resp: 16    General appearance: alert, no distress, WD/WN, pleasant AA female, lying on exam table, feels febrile/hot to touch HEENT: normocephalic, sclerae anicteric, TMs pearly, nares patent, some dry mucoid discharge present, no erythema,  pharynx normal Oral cavity: MMM, no lesions Neck: supple, no lymphadenopathy, no thyromegaly, no masses Heart: RRR, normal S1, S2, no murmurs Lungs: CTA bilaterally, no wheezes, rhonchi, or rales Abdomen: +bs, soft, non tender, non distended, no masses, no hepatomegaly, no splenomegaly Back: nontender Pulses: 2+ symmetric, upper and lower extremities, normal cap refill  Assessment: Encounter Diagnoses  Name Primary?  . Abdominal pain Yes  . Fever, unspecified   . Nausea alone     Plan: Discussed her symptoms.  Reviewed the recent ED reports, CT abdomen/pelvis, labs, and discussed case with supervising physician Dr. Susann Givens.  Repeat labs today, change to Cipro, stop Bactrim, and await labs.  Advise she call/return if worse or not improving.  Follow-up pending labs

## 2012-12-18 LAB — URINALYSIS, MICROSCOPIC ONLY
Bacteria, UA: NONE SEEN
Casts: NONE SEEN
Crystals: NONE SEEN

## 2013-01-22 ENCOUNTER — Other Ambulatory Visit: Payer: Self-pay

## 2013-03-23 ENCOUNTER — Other Ambulatory Visit: Payer: Self-pay | Admitting: Family Medicine

## 2013-03-23 DIAGNOSIS — E049 Nontoxic goiter, unspecified: Secondary | ICD-10-CM

## 2013-03-23 DIAGNOSIS — E010 Iodine-deficiency related diffuse (endemic) goiter: Secondary | ICD-10-CM

## 2013-03-25 ENCOUNTER — Ambulatory Visit
Admission: RE | Admit: 2013-03-25 | Discharge: 2013-03-25 | Disposition: A | Discharge status: Home / Self Care | Payer: BC Managed Care – PPO | Source: Ambulatory Visit | Attending: Family Medicine | Admitting: Family Medicine

## 2013-03-25 DIAGNOSIS — E049 Nontoxic goiter, unspecified: Secondary | ICD-10-CM

## 2013-03-25 DIAGNOSIS — E010 Iodine-deficiency related diffuse (endemic) goiter: Secondary | ICD-10-CM

## 2013-04-06 ENCOUNTER — Encounter: Payer: Self-pay | Admitting: Obstetrics & Gynecology

## 2013-04-06 ENCOUNTER — Ambulatory Visit (INDEPENDENT_AMBULATORY_CARE_PROVIDER_SITE_OTHER): Payer: BC Managed Care – PPO | Admitting: Obstetrics & Gynecology

## 2013-04-06 VITALS — BP 100/68 | HR 64 | Resp 16 | Ht 66.75 in | Wt 210.6 lb

## 2013-04-06 DIAGNOSIS — N390 Urinary tract infection, site not specified: Secondary | ICD-10-CM

## 2013-04-06 DIAGNOSIS — Z01419 Encounter for gynecological examination (general) (routine) without abnormal findings: Secondary | ICD-10-CM

## 2013-04-06 DIAGNOSIS — Z0000 Encounter for general adult medical examination without abnormal findings: Secondary | ICD-10-CM

## 2013-04-06 DIAGNOSIS — Z124 Encounter for screening for malignant neoplasm of cervix: Secondary | ICD-10-CM

## 2013-04-06 DIAGNOSIS — N9489 Other specified conditions associated with female genital organs and menstrual cycle: Secondary | ICD-10-CM

## 2013-04-06 DIAGNOSIS — N898 Other specified noninflammatory disorders of vagina: Secondary | ICD-10-CM

## 2013-04-06 LAB — POCT URINALYSIS DIPSTICK
BILIRUBIN UA: NEGATIVE
Blood, UA: NEGATIVE
GLUCOSE UA: NEGATIVE
Ketones, UA: NEGATIVE
NITRITE UA: NEGATIVE
PH UA: 5
Protein, UA: NEGATIVE
Urobilinogen, UA: NEGATIVE

## 2013-04-06 LAB — HEMOGLOBIN, FINGERSTICK: Hemoglobin, fingerstick: 13.5 g/dL (ref 12.0–16.0)

## 2013-04-06 MED ORDER — ESTRADIOL 0.5 MG PO TABS: 0.5000 mg | ORAL_TABLET | Freq: Every day | ORAL | Status: DC

## 2013-04-06 NOTE — Patient Instructions (Signed)

## 2013-04-06 NOTE — Progress Notes (Signed)
63 y.o. G0P0000 MarriedAfrican AmericanF here for annual exam.  No vaginal bleeding.    Reports having recurrent UTIs.  Started about two years ago.  Printed her records from [facility] so she could see how many times treated with antibiotics.  She has been treated 8 times since 1/12.  Always has something abnormal about her urine but no positive urine cultures.  Pt decided to see Dr. Collene Mares, her GI.  Last colonoscopy was 2012.  Was negative except for 2 polyps.  Recommendation is to repeat in 2017.  Went to ER in 9/14.  Had labs and CT.  Reviewed this today.  CT was negative.  Symptoms seem to be worse when bladder is full.  Not really pain but discomfort.  Can have pain in back as well.   States it feels like she is getting ready to start her period but pt had a TAH 2000.  Cranberry tablets seem to help and antibiotics help as well.    No LMP recorded. Patient has had a hysterectomy.          Sexually active: yes  The current method of family planning is status post hysterectomy.    Exercising: yes  sporadic-walking and gym Smoker:  No quit 6 months ago  Health Maintenance: Pap:  03/01/11 WNL/negative HR HPV History of abnormal Pap:  no MMG:  Per patient 1/15 Colonoscopy:  2012 repeat in 5 years BMD:   12/11 TDaP:  2008 Screening Labs: with PCP, Hb today: 13.5, Urine today: WBC-1+   reports that she has quit smoking. Her smoking use included Cigarettes. She has a 15 pack-year smoking history. She has never used smokeless tobacco. She reports that she drinks alcohol. She reports that she does not use illicit drugs.  Past Medical History  Diagnosis Date  . Anemia   . Hyperlipidemia   . Breast cyst 2007    Right  . GERD (gastroesophageal reflux disease)   . Tobacco user   . Depression 2006    w/ menopause  . Hypertension   . Allergic rhinitis   . Multinodular goiter     Past Surgical History  Procedure Laterality Date  . Total abdominal hysterectomy  1986  . Breast biopsy       Current Outpatient Prescriptions  Medication Sig Dispense Refill  . benazepril-hydrochlorthiazide (LOTENSIN HCT) 20-12.5 MG per tablet Take 1 tablet by mouth daily.      [name] CRANBERRY PO Take by mouth daily.      [name] esomeprazole (NEXIUM) 20 MG capsule Take 20 mg by mouth daily before breakfast.      . estradiol (ESTRACE) 0.5 MG tablet Take 0.5 mg by mouth daily.        . hydrochlorothiazide (,MICROZIDE/HYDRODIURIL,) 12.5 MG capsule Take 12.5 mg by mouth daily.        [name] HYDROcodone-acetaminophen (NORCO/VICODIN) 5-325 MG per tablet Take 1 tablet by mouth every 6 (six) hours as needed for pain.  15 tablet  0  . pravastatin (PRAVACHOL) 40 MG tablet Take 40 mg by mouth every evening.       . Probiotic Product (RESTORA PO) Take by mouth daily.      . fluticasone (FLONASE) 50 MCG/ACT nasal spray Place 2 sprays into the nose daily.      [name] levocetirizine (XYZAL) 5 MG tablet Take 5 mg by mouth every evening.       No current facility-administered medications for this visit.    Family History  Problem Relation Age of Onset  .  Kidney cancer Father   . Pneumonia Father   . Hypertension Mother   . Deep vein thrombosis Mother   . Alzheimer's disease Mother   . Heart attack Maternal Grandfather   . Breast cancer Sister   . Allergies Brother   . Allergies Sister   . Allergies Sister   . Heart attack Brother   . Clotting disorder Sister   . Cancer Other     3 paternal uncles: stomach, brain, and colon CA  . Cancer Other     3 paternal aunts: colon, breast, and unsure    ROS:  Pertinent items are noted in HPI.  Otherwise, a comprehensive ROS was negative.  Exam:   BP 100/68  Pulse 64  Resp 16  Ht 5' 6.75" (1.695 m)  Wt 210 lb 9.6 oz (95.528 kg)  BMI 33.25 kg/m2  Weight change: -1lb   Height: 5' 6.75" (169.5 cm)  Ht Readings from Last 3 Encounters:  04/06/13 5' 6.75" (1.695 m)  11/22/10 5\' 7"  (1.702 m)  11/08/10 5\' 7"  (1.702 m)    General appearance: alert, cooperative and appears  stated age Head: Normocephalic, without obvious abnormality, atraumatic Neck: no adenopathy, supple, symmetrical, trachea midline and thyroid normal to inspection and palpation Lungs: clear to auscultation bilaterally Breasts: normal appearance, no masses or tenderness Heart: regular rate and rhythm Abdomen: soft, non-tender; bowel sounds normal; no masses,  no organomegaly Extremities: extremities normal, atraumatic, no cyanosis or edema Skin: Skin color, texture, turgor normal. No rashes or lesions Lymph nodes: Cervical, supraclavicular, and axillary nodes normal. No abnormal inguinal nodes palpated Neurologic: Grossly normal   Pelvic: External genitalia:  no lesions              Urethra:  normal appearing urethra with no masses, tenderness or lesions              Bartholins and Skenes: normal                 Vagina: normal appearing vagina with normal color and discharge, no lesions              Cervix: absent              Pap taken: no Bimanual Exam:  Uterus:  uterus absent              Adnexa: normal adnexa and no mass, fullness, tenderness               Rectovaginal: Confirms               Anus:  normal sphincter tone, no lesions  A:  Well Woman with normal exam H/O supracervical hysterectomy 2000 due to fibroids, endometriosis Elevated lipids and hypertension Family hx of breast cancer Recurrent "uti's" with negative cultures.  Possible IC.  P:   Mammogram yearly.  Pt has MMG 2/15 scheduled.  pap smear with neg HR HPV 12/12.  Pap today. Estradiol 0.5mg  daily.  #90/3RF.  Pt will try to wean to 0.25mg  dosage. Gc/Chl.  Wet smear neg for trich.  May need to be treated with vaginal estrogen if testing negative. Referral to urology.  R/O IC. Return for PUS to assess ovaries.   return annually or prn  An After Visit Summary was printed and given to the patient.

## 2013-04-07 LAB — URINE CULTURE
Colony Count: NO GROWTH
Organism ID, Bacteria: NO GROWTH

## 2013-04-09 ENCOUNTER — Ambulatory Visit (INDEPENDENT_AMBULATORY_CARE_PROVIDER_SITE_OTHER): Payer: BC Managed Care – PPO | Admitting: Obstetrics & Gynecology

## 2013-04-09 ENCOUNTER — Ambulatory Visit (INDEPENDENT_AMBULATORY_CARE_PROVIDER_SITE_OTHER): Payer: BC Managed Care – PPO

## 2013-04-09 ENCOUNTER — Other Ambulatory Visit: Payer: Self-pay | Admitting: Obstetrics & Gynecology

## 2013-04-09 ENCOUNTER — Encounter: Payer: Self-pay | Admitting: Obstetrics & Gynecology

## 2013-04-09 VITALS — BP 102/62 | Ht 66.75 in | Wt 212.0 lb

## 2013-04-09 DIAGNOSIS — N9489 Other specified conditions associated with female genital organs and menstrual cycle: Secondary | ICD-10-CM

## 2013-04-09 LAB — IPS PAP SMEAR ONLY

## 2013-04-09 LAB — IPS N GONORRHOEA AND CHLAMYDIA BY PCR

## 2013-04-09 MED ORDER — FLUCONAZOLE 150 MG PO TABS: 150.0000 mg | ORAL_TABLET | Freq: Once | ORAL | Status: DC

## 2013-04-09 NOTE — Patient Instructions (Signed)

## 2013-04-09 NOTE — Progress Notes (Signed)
63 y.o.Marriedfemale here for a pelvic ultrasound.  Patient with hx of low abdominal pain.  She had a colonoscopy 2012 with Dr. Collene Mares.  Pt has been treated multiple times for UTI in the last 2 years.  I wonder if she has IC.  She is seeing Dr. Matilde Sprang tomorrow morning.  She will take all of her records from the UTI treatments with her.  H/O supracervical hysterectomy (done elsewhere) and here for PUS today.  Pt had new vaginal discharge when I saw her on Monday, Jan19.  GC/Chl testing was negative.  Wet smear was negative.  I think she needs vaginal estrogen but will treat with Diflucan first.  If discharge continues, she can start Premarin cream.  She wants to wait until she sees Dr. Matilde Sprang first.    Patient's last menstrual period was 03/19/1998.  Sexually active:  yes  Contraception: supracervical hysterectomy  FINDINGS: UTERUS: absent, cervix is present with Nabothian cysts EMS: n/a ADNEXA:   Left ovary 3.0 x 1.9 x 1.1cm   Right ovary 2.6 x 1.9 x 1.4cm.  No masses or cysts on either ovary. CUL DE SAC: neg for free fluid Kidneys imaged as well.  No gross abnormalities.  No stones.  Images reviewed personally with pt.  She is pleased everything appears normal.  Tests from Monday reviewed.  Pap still pending but will probably be normal as Pap and HR HPV were negative 1 year ago.     Assessment:  Pelvic pain/pressure, recurrent UTI symptoms with no positive cultures, possible IC?, vaginal discharge  Plan:  Diflucan 150mg  po x 1, repeat 48 hrs. Urology referral tomorrow  ~15 minutes spent with patient >50% of time was in face to face discussion of above.

## 2013-04-13 ENCOUNTER — Telehealth: Payer: Self-pay | Admitting: Family Medicine

## 2013-04-13 NOTE — Telephone Encounter (Signed)
Patient wants an extension on Diflucan RX. Patient states she was "only given enough for two days and needs some more as it is really helping."  [facility] [phone] [place]

## 2013-04-13 NOTE — Telephone Encounter (Signed)
Message left to return call to Fleeta Kunde at [phone].    

## 2013-04-13 NOTE — Telephone Encounter (Signed)
LMTCB

## 2013-04-13 NOTE — Telephone Encounter (Signed)
Pt returning call

## 2013-04-14 MED ORDER — FLUCONAZOLE 150 MG PO TABS: 150.0000 mg | ORAL_TABLET | Freq: Once | ORAL | Status: DC

## 2013-04-14 NOTE — Telephone Encounter (Signed)
Patient returned call. States that symptoms of vaginal discharge have greatly decreased. She feels the diflucan has really helped and wondering if she can try another round of diflucan to see if symptoms continue to improve. Patient has been seen at [facility] and has appointments for both PT and Kidney U/S.  Advised I would send request to Dr. [name] but that per her note she may want to try another medication. I advised I would call back with Dr. Ammie Ferrier response. Patient agreeable.

## 2013-04-14 NOTE — Telephone Encounter (Signed)
Order for medication placed to pharmacy.  Please advise ok for her to take again.

## 2013-04-15 NOTE — Telephone Encounter (Signed)
Pt notified to take another round of diflucan and call back if symptoms not gone.

## 2013-07-13 ENCOUNTER — Encounter: Payer: Self-pay | Admitting: Obstetrics & Gynecology

## 2013-07-13 ENCOUNTER — Ambulatory Visit (INDEPENDENT_AMBULATORY_CARE_PROVIDER_SITE_OTHER): Payer: BC Managed Care – PPO | Admitting: Obstetrics & Gynecology

## 2013-07-13 VITALS — BP 120/78 | HR 64 | Resp 16 | Ht 66.75 in | Wt 216.8 lb

## 2013-07-13 DIAGNOSIS — N898 Other specified noninflammatory disorders of vagina: Secondary | ICD-10-CM

## 2013-07-13 MED ORDER — BENAZEPRIL HCL 20 MG PO TABS: 20.0000 mg | ORAL_TABLET | Freq: Every day | ORAL | Status: DC

## 2013-07-13 NOTE — Progress Notes (Signed)
Subjective:     Patient ID: Pamela Lowe, female   DOB: 1950-05-19, 64 y.o.   MRN: [id number]  HPI 63 yo G1P0 MAAF here for follow up of vaginal discharge.  Saw Dr. Matilde Sprang.  She saw him in February and April.  She was prescribed Trimethroprim but didn't start it.  He is ok with this.  Feels like the pro-biotic has really helped.  GI issues of "rumbling and pain" are much better.     Pap 04/06/13 normal.  Ultrasound 04/09/13 showed normal ovaries.  No uterus present.  Normal cervix present.  MMg 05/07/13 neg.  She did a 3D MMG.    When she decreased HRT to 1/2 of the 0.5mg  estradiol, pt felt more moody and short with everyone around her.  She took [place] for a couple of weeks and felt that it helped.  Then she stopped and has really felt good.  She would like to wean off.    Review of Systems     Objective:   Physical Exam  Constitutional: She is oriented to person, place, and time. She appears well-developed and well-nourished.  Abdominal: Soft. Bowel sounds are normal.  Genitourinary: There is no rash or tenderness on the right labia. There is no rash or tenderness on the left labia. Cervix exhibits no discharge. Right adnexum displays no mass and no tenderness. Left adnexum displays no mass and no tenderness. No erythema or bleeding around the vagina. No foreign body around the vagina. No signs of injury around the vagina. Vaginal discharge found.  Absent uterus.  Lymphadenopathy:       Right: No inguinal adenopathy present.       Left: No inguinal adenopathy present.  Neurological: She is alert and oriented to person, place, and time.  Skin: Skin is warm and dry.  Psychiatric: She has a normal mood and affect.       Assessment:     Continued vaginal discharge H/o supracervical hysterectomy Probable IC     Plan:     Affirm done  Taking estradiol 0.5mg --1/2 tablet daily.  Will wait until the fall and start taper off.  May used [place] when she does this.

## 2013-07-13 NOTE — Patient Instructions (Signed)
We will call with your test results in 1-2 days.

## 2013-07-14 ENCOUNTER — Telehealth: Payer: Self-pay

## 2013-07-14 LAB — WET PREP BY MOLECULAR PROBE
Candida species: NEGATIVE
Gardnerella vaginalis: POSITIVE — AB
TRICHOMONAS VAG: NEGATIVE

## 2013-07-14 MED ORDER — METRONIDAZOLE 0.75 % VA GEL: 1.0000 | Freq: Every day | VAGINAL | Status: DC

## 2013-07-14 NOTE — Addendum Note (Signed)
Addended by: Megan Salon on: 07/14/2013 07:19 AM   Modules accepted: Orders

## 2013-07-14 NOTE — Telephone Encounter (Signed)
Message copied by Jasmine Awe on Tue Jul 14, 2013  9:03 AM ------      Message from: Megan Salon      Created: Tue Jul 14, 2013  7:18 AM       Please call patient and inform testing showed BV--just some bacterial change.  Should treat with Metrogel 1.19% one applicator full qhs x 5 days.  If symptoms not gone, please have her call.  Message was also sent through [place]. ------

## 2013-07-14 NOTE — Telephone Encounter (Signed)
Spoke with patient. Message from [place] given as seen below. Patient agreeable and verbalizes understanding. Will call back is symptoms do not resolve.   Routing to provider for final review. Patient agreeable to disposition. Will close encounter

## 2013-07-14 NOTE — Telephone Encounter (Signed)
Left message to call Dusten Ellinwood at [phone]. 

## 2014-04-15 ENCOUNTER — Encounter: Payer: Self-pay | Admitting: Obstetrics & Gynecology

## 2014-04-16 ENCOUNTER — Ambulatory Visit (INDEPENDENT_AMBULATORY_CARE_PROVIDER_SITE_OTHER): Payer: BLUE CROSS/BLUE SHIELD | Admitting: Obstetrics & Gynecology

## 2014-04-16 ENCOUNTER — Encounter: Payer: Self-pay | Admitting: Obstetrics & Gynecology

## 2014-04-16 VITALS — BP 118/78 | HR 68 | Resp 20 | Ht 67.25 in | Wt 219.4 lb

## 2014-04-16 DIAGNOSIS — R82998 Other abnormal findings in urine: Secondary | ICD-10-CM

## 2014-04-16 DIAGNOSIS — Z0000 Encounter for general adult medical examination without abnormal findings: Secondary | ICD-10-CM

## 2014-04-16 DIAGNOSIS — Z01419 Encounter for gynecological examination (general) (routine) without abnormal findings: Secondary | ICD-10-CM

## 2014-04-16 DIAGNOSIS — M25511 Pain in right shoulder: Secondary | ICD-10-CM

## 2014-04-16 DIAGNOSIS — N390 Urinary tract infection, site not specified: Secondary | ICD-10-CM

## 2014-04-16 LAB — POCT URINALYSIS DIPSTICK
Bilirubin, UA: NEGATIVE
Blood, UA: NEGATIVE
Glucose, UA: NEGATIVE
Ketones, UA: NEGATIVE
Nitrite, UA: NEGATIVE
Protein, UA: NEGATIVE
UROBILINOGEN UA: NEGATIVE
pH, UA: 5

## 2014-04-16 MED ORDER — METRONIDAZOLE 0.75 % VA GEL: 1.0000 | Freq: Every day | VAGINAL | Status: DC

## 2014-04-16 NOTE — Progress Notes (Signed)
64 y.o. G1P0010 MarriedAfrican AmericanF here for annual exam.  Doing well.  No vaginal bleeding.  Pt reports all of her abdominal complaints/vaginal complaints have resolved.  Off her HRT, now.  Using [facility].    PCP:  Dr. Chapman Fitch.  Most recent visit was this week.  Has also seen Dr. Trudie Reed this past year.  CRP was elevated and pt has follow-up in six months.    Pt reports her shoulder is bothering her.  Cannot lift her arm above 180 degrees.  Pain when she tried to brush her hair.  Extension hurts, like when she is using a vacuum cleaner.  Aches at night, especially if she is on her right side.  Has not seen anyone and she would like to at this time.  Antiinflammatories have helped but pt doesn't like to take pain medications if not needed.    Patient's last menstrual period was 03/19/1998.          Sexually active: Yes.    The current method of family planning is status post hysterectomy.    Exercising: Yes.    walking 2 x weekly when weather permits Smoker:  Former smoker   Health Maintenance: Pap:  04/06/13 WNL History of abnormal Pap:  no MMG:  05/07/13 3D-normal Colonoscopy:  2012-repeat in 5 years BMD:   12/11 TDaP:  2008  Screening Labs: PCP, Hb today: PCP, Urine today: WBC-2+   reports that she has quit smoking. Her smoking use included Cigarettes. She has a 15 pack-year smoking history. She has never used smokeless tobacco. She reports that she drinks alcohol. She reports that she does not use illicit drugs.  Past Medical History  Diagnosis Date  . Anemia   . Hyperlipidemia   . Breast cyst 2007    Right  . GERD (gastroesophageal reflux disease)   . Tobacco user   . Depression 2006    w/ menopause  . Hypertension   . Allergic rhinitis   . Multinodular goiter   . History of hysterectomy, supracervical     Past Surgical History  Procedure Laterality Date  . Total abdominal hysterectomy  1986    h/o supracervical hysterectomy  . Breast biopsy      Current  Outpatient Prescriptions  Medication Sig Dispense Refill  . benazepril (LOTENSIN) 20 MG tablet Take 1 tablet (20 mg total) by mouth daily.    [name] CRANBERRY PO Take by mouth daily.    [name] esomeprazole (NEXIUM) 20 MG capsule Take 20 mg by mouth daily before breakfast.    . fluticasone (FLONASE) 50 MCG/ACT nasal spray Place 2 sprays into the nose daily.    . hydrochlorothiazide (,MICROZIDE/HYDRODIURIL,) 12.5 MG capsule Take 12.5 mg by mouth daily.      . Omega-3 Fatty Acids (FISH OIL PO) Take by mouth daily.    . Probiotic Product (RESTORA PO) Take by mouth daily.    [name] estradiol (ESTRACE) 0.5 MG tablet Take 1 tablet (0.5 mg total) by mouth daily. (Patient not taking: Reported on 04/16/2014) 90 tablet 4  . loratadine (CLARITIN) 10 MG tablet Take 10 mg by mouth daily. Has not started this yet     No current facility-administered medications for this visit.    Family History  Problem Relation Age of Onset  . Kidney cancer Father   . Pneumonia Father   . Hypertension Mother   . Deep vein thrombosis Mother   . Alzheimer's disease Mother   . Heart attack Maternal Grandfather   . Breast  cancer Sister 37  . Allergies Brother   . Allergies Sister   . Allergies Sister   . Heart attack Brother   . Clotting disorder Sister   . Cancer Other     3 paternal uncles: stomach, brain, and colon CA  . Cancer Other     3 paternal aunts: colon, breast, and unsure  . Hypertension Father   . Deep vein thrombosis Sister     unclear cause  . Breast cancer Paternal Aunt   . Osteopenia Mother     ROS:  Pertinent items are noted in HPI.  Otherwise, a comprehensive ROS was negative.  Exam:   Ht 5' 7.25" (1.708 m)  Wt 219 lb 6.4 oz (99.519 kg)  BMI 34.11 kg/m2  LMP 03/19/1998  Height: 5' 7.25" (170.8 cm) (with shoes)  Ht Readings from Last 3 Encounters:  04/16/14 5' 7.25" (1.708 m)  07/13/13 5' 6.75" (1.695 m)  04/09/13 5' 6.75" (1.695 m)    General appearance: alert, cooperative and appears stated  age Head: Normocephalic, without obvious abnormality, atraumatic Neck: no adenopathy, supple, symmetrical, trachea midline and thyroid normal to inspection and palpation Lungs: clear to auscultation bilaterally Breasts: normal appearance, no masses or tenderness Heart: regular rate and rhythm Abdomen: soft, non-tender; bowel sounds normal; no masses,  no organomegaly Extremities: extremities normal, atraumatic, no cyanosis or edema Skin: Skin color, texture, turgor normal. No rashes or lesions Lymph nodes: Cervical, supraclavicular, and axillary nodes normal. No abnormal inguinal nodes palpated Neurologic: Grossly normal   Pelvic: External genitalia:  no lesions              Urethra:  normal appearing urethra with no masses, tenderness or lesions              Bartholins and Skenes: normal                 Vagina: normal appearing vagina with normal color and discharge, no lesions              Cervix: no lesions              Pap taken: Yes.   Bimanual Exam:  Uterus:  uterus absent              Adnexa: normal adnexa and no mass, fullness, tenderness               Rectovaginal: Confirms               Anus:  normal sphincter tone, no lesions  Chaperone was present for exam.  A:  Well Woman with normal exam H/O supracervical hysterectomy 2000 due to fibroids, endometriosis Elevated lipids and hypertension Family hx of breast cancer Recurrent "uti's" with negative cultures. Has seen Dr. Matilde Sprang. Possible rotator cuff injury. Vaginal d/c  P: Mammogram yearly. Pt has MMG 2/15 scheduled.  pap smear with neg HR HPV 12/12. Pap 2014.  No pap today. Off HRT. Urine micro and culture pending Referral to [facility] Metrogel 0.75% nightly for five nigths return annually or prn

## 2014-04-17 LAB — URINALYSIS, MICROSCOPIC ONLY
BACTERIA UA: NONE SEEN
Casts: NONE SEEN
Crystals: NONE SEEN
Squamous Epithelial / LPF: NONE SEEN

## 2014-04-18 LAB — URINE CULTURE

## 2014-05-03 ENCOUNTER — Ambulatory Visit (INDEPENDENT_AMBULATORY_CARE_PROVIDER_SITE_OTHER): Payer: BLUE CROSS/BLUE SHIELD | Admitting: Obstetrics & Gynecology

## 2014-05-03 ENCOUNTER — Ambulatory Visit: Payer: BLUE CROSS/BLUE SHIELD | Admitting: Obstetrics & Gynecology

## 2014-05-03 VITALS — BP 118/78 | HR 60 | Resp 16 | Wt 222.4 lb

## 2014-05-03 DIAGNOSIS — N390 Urinary tract infection, site not specified: Secondary | ICD-10-CM

## 2014-05-03 DIAGNOSIS — R3129 Other microscopic hematuria: Secondary | ICD-10-CM

## 2014-05-03 DIAGNOSIS — R312 Other microscopic hematuria: Secondary | ICD-10-CM

## 2014-05-03 DIAGNOSIS — R82998 Other abnormal findings in urine: Secondary | ICD-10-CM

## 2014-05-04 LAB — URINALYSIS, MICROSCOPIC ONLY
Bacteria, UA: NONE SEEN
Casts: NONE SEEN
Crystals: NONE SEEN
SQUAMOUS EPITHELIAL / LPF: NONE SEEN

## 2014-05-09 ENCOUNTER — Encounter: Payer: Self-pay | Admitting: Obstetrics & Gynecology

## 2014-05-09 NOTE — Progress Notes (Signed)
    Squamous Epithelial / LPF RARE  NONE SEEN RARE RARE    Crystals NONE SEEN  NONE SEEN NONE SEEN     Casts NONE SEEN  NONE SEEN NONE SEEN     WBC, UA <3 WBC/hpf 3-6 (A) 0-2 0-2    RBC / HPF <3 RBC/hpf 3-6 (A) 0-2 0-2    Bacteria, UA RARE  NONE SEEN

## 2014-05-09 NOTE — Progress Notes (Signed)
Patient ID: Pamela Lowe, female   DOB: [birth year], 64 y.o.   MRN: [id number]   Very nice 64 yo G1P0 MAAF here for cath u/a after micro done at [facility] was positive for RBCs and WBCs.  Pt is very anxious that there is something wrong with her due to episode of pelvic/abdominal pain she had about a year ago.  At [facility] 04/06/13 this was discussed.  Pt was having recurrent UTIs per her hx.  She did bring records.  No urine cultures were done.  PUS was performed and pt was referred to Dr. Matilde Sprang for consultation for possible IC.  Pain has gotten much better.     Then at [facility] 04/16/14, dip u/a showed WBCs.  Micro showed:     Ref Range 3wk ago (04/16/14) 71yr ago (12/17/12) 36yr ago (12/13/12)     Squamous Epithelial / LPF RARE  NONE SEEN RARE RARE    Crystals NONE SEEN  NONE SEEN NONE SEEN     Casts NONE SEEN  NONE SEEN NONE SEEN     WBC, UA <3 WBC/hpf 3-6 (A) 0-2 0-2    RBC / HPF <3 RBC/hpf 3-6 (A) 0-2 0-2    Bacteria, UA RARE  NONE SEEN NONE SEEN RARE   Resulting Agency  [facility]        Due to WBCs and RBCs, cath u/a recommended.  Pt here for this today.  Procedure:  Using sterile technique and after cleansing urethra with Betadine, sterile straight catheterization was performed.  Urine sent for repeat micro.  Pt tolerated procedure and all instruments were removed.    Assessment:  RBCs and WBCs on urine micro  Plan:  Repeat micro pending.  If negative, no additional w/u will be recommended.

## 2014-05-12 ENCOUNTER — Encounter: Payer: Self-pay | Admitting: *Deleted

## 2014-05-12 ENCOUNTER — Other Ambulatory Visit: Payer: Self-pay | Admitting: Family Medicine

## 2014-05-12 DIAGNOSIS — E049 Nontoxic goiter, unspecified: Secondary | ICD-10-CM

## 2014-05-14 ENCOUNTER — Ambulatory Visit
Admission: RE | Admit: 2014-05-14 | Discharge: 2014-05-14 | Disposition: A | Discharge status: Home / Self Care | Payer: BLUE CROSS/BLUE SHIELD | Source: Ambulatory Visit | Attending: Family Medicine | Admitting: Family Medicine

## 2014-05-14 DIAGNOSIS — E049 Nontoxic goiter, unspecified: Secondary | ICD-10-CM

## 2014-08-10 ENCOUNTER — Ambulatory Visit (INDEPENDENT_AMBULATORY_CARE_PROVIDER_SITE_OTHER): Payer: BLUE CROSS/BLUE SHIELD

## 2014-08-10 ENCOUNTER — Encounter: Payer: Self-pay | Admitting: Podiatry

## 2014-08-10 ENCOUNTER — Ambulatory Visit (INDEPENDENT_AMBULATORY_CARE_PROVIDER_SITE_OTHER): Payer: BLUE CROSS/BLUE SHIELD | Admitting: Podiatry

## 2014-08-10 VITALS — BP 109/76 | HR 66 | Resp 12

## 2014-08-10 DIAGNOSIS — M2042 Other hammer toe(s) (acquired), left foot: Secondary | ICD-10-CM

## 2014-08-10 DIAGNOSIS — M2012 Hallux valgus (acquired), left foot: Secondary | ICD-10-CM

## 2014-08-10 DIAGNOSIS — M779 Enthesopathy, unspecified: Secondary | ICD-10-CM

## 2014-08-10 DIAGNOSIS — M79672 Pain in left foot: Secondary | ICD-10-CM

## 2014-08-10 DIAGNOSIS — M21612 Bunion of left foot: Secondary | ICD-10-CM

## 2014-08-10 MED ORDER — DICLOFENAC SODIUM 75 MG PO TBEC: 75.0000 mg | DELAYED_RELEASE_TABLET | Freq: Two times a day (BID) | ORAL | Status: DC

## 2014-08-10 MED ORDER — TRIAMCINOLONE ACETONIDE 10 MG/ML IJ SUSP
10.0000 mg | Freq: Once | INTRAMUSCULAR | Status: AC
  Administered 2014-08-10: 10 mg

## 2014-08-10 NOTE — Patient Instructions (Signed)
Pre-Operative Instructions  Congratulations, you have decided to take an important step to improving your quality of life.  You can be assured that the doctors of [facility] will be with you every step of the way.  1. Plan to be at the surgery center/hospital at least 1 (one) hour prior to your scheduled time unless otherwise directed by the surgical center/hospital staff.  You must have a responsible adult accompany you, remain during the surgery and drive you home.  Make sure you have directions to the surgical center/hospital and know how to get there on time. 2. For hospital based surgery you will need to obtain a history and physical form from your family physician within 1 month prior to the date of surgery- we will give you a form for you primary physician.  3. We make every effort to accommodate the date you request for surgery.  There are however, times where surgery dates or times have to be moved.  We will contact you as soon as possible if a change in schedule is required.   4. No Aspirin/Ibuprofen for one week before surgery.  If you are on aspirin, any non-steroidal anti-inflammatory medications (Mobic, Aleve, Ibuprofen) you should stop taking it 7 days prior to your surgery.  You make take Tylenol  For pain prior to surgery.  5. Medications- If you are taking daily heart and blood pressure medications, seizure, reflux, allergy, asthma, anxiety, pain or diabetes medications, make sure the surgery center/hospital is aware before the day of surgery so they may notify you which medications to take or avoid the day of surgery. 6. No food or drink after midnight the night before surgery unless directed otherwise by surgical center/hospital staff. 7. No alcoholic beverages 24 hours prior to surgery.  No smoking 24 hours prior to or 24 hours after surgery. 8. Wear loose pants or shorts- loose enough to fit over bandages, boots, and casts. 9. No slip on shoes, sneakers are best. 10. Bring  your boot with you to the surgery center/hospital.  Also bring crutches or a walker if your physician has prescribed it for you.  If you do not have this equipment, it will be provided for you after surgery. 11. If you have not been contracted by the surgery center/hospital by the day before your surgery, call to confirm the date and time of your surgery. 12. Leave-time from work may vary depending on the type of surgery you have.  Appropriate arrangements should be made prior to surgery with your employer. 13. Prescriptions will be provided immediately following surgery by your doctor.  Have these filled as soon as possible after surgery and take the medication as directed. 14. Remove nail polish on the operative foot. 15. Wash the night before surgery.  The night before surgery wash the foot and leg well with the antibacterial soap provided and water paying special attention to beneath the toenails and in between the toes.  Rinse thoroughly with water and dry well with a towel.  Perform this wash unless told not to do so by your physician.  Enclosed: 1 Ice pack (please put in freezer the night before surgery)   1 Hibiclens skin cleaner   Pre-op Instructions  If you have any questions regarding the instructions, do not hesitate to call our office.  [place]: [address] [place], [place] [address] [phone]  [place]: [address] [place], [place] [address] [phone]  [place]: [address]  [place],  [address] [phone]  Dr. Richard   Tuchman DPM, Dr. Norman Regal DPM Dr. Richard Sikora DPM, Dr. M. Todd Hyatt DPM, Dr. Kathryn Egerton DPM 

## 2014-08-10 NOTE — Progress Notes (Signed)
   Subjective:    Patient ID: Pamela Lowe, female    DOB: [birth year], 64 y.o.   MRN: [id number]  HPI  Left foot pain about a month, pain has gotten worse since stopping medication Mostly painful to walk on or to move, tried RX predisone which did help some  Review of Systems  HENT: Positive for sinus pressure.   Cardiovascular: Positive for leg swelling.       Objective:   Physical Exam        Assessment & Plan:

## 2014-08-11 NOTE — Progress Notes (Signed)
Subjective:     Patient ID: [name], female   DOB: [birth year], 64 y.o.   MRN: [id number]  HPI patient presents stating I'm having a lot of pain in my left foot on top and in the arch. The pain seems to come and go and is not consistent but it's very bothersome. I also have structural bunion deformity left and hammertoe and I had this on my right foot that was fixed by you and did beautifully. I want to get the left one fixed   Review of Systems  All other systems reviewed and are negative.      Objective:   Physical Exam  Constitutional: She is oriented to person, place, and time.  Cardiovascular: Intact distal pulses.   Musculoskeletal: Normal range of motion.  Neurological: She is oriented to person, place, and time.  Skin: Skin is warm.  Nursing note and vitals reviewed.  neurovascular status intact with muscle strength adequate range of motion within normal limits. Patient's noted to have moderate discomfort in the midfoot area left with inflammation and mild discomfort in the left arch with significant flatfoot deformity. Patient has hyperostosis medial aspect first metatarsal head left with deviation against the second toe and keratotic lesion of the fifth toe left it's painful when pressed with a well-healed surgical site right first metatarsal from structural bunion and I had fixed years ago     Assessment:     Probable tendinitis of the dorsal left foot and fasciitis left along with structural bunion deformity and hammertoe deformity    Plan:     H&P reviewed and conditions discussed with patient. At this point I'm focusing on the pain and I did inject the dorsal tendon extensor complex 3 mg Kenalog 5 mg Xylocaine and applied fascial brace area I then went ahead and I discussed bunion correction which she wants done and hammertoe repair fifth left and we will discuss this at next visit

## 2014-08-17 ENCOUNTER — Encounter: Payer: Self-pay | Admitting: Podiatry

## 2014-08-17 ENCOUNTER — Ambulatory Visit (INDEPENDENT_AMBULATORY_CARE_PROVIDER_SITE_OTHER): Payer: BLUE CROSS/BLUE SHIELD | Admitting: Podiatry

## 2014-08-17 ENCOUNTER — Ambulatory Visit (INDEPENDENT_AMBULATORY_CARE_PROVIDER_SITE_OTHER): Payer: BLUE CROSS/BLUE SHIELD

## 2014-08-17 VITALS — BP 148/90 | HR 64 | Resp 12

## 2014-08-17 DIAGNOSIS — M722 Plantar fascial fibromatosis: Secondary | ICD-10-CM

## 2014-08-17 DIAGNOSIS — M779 Enthesopathy, unspecified: Secondary | ICD-10-CM | POA: Diagnosis not present

## 2014-08-17 MED ORDER — TRIAMCINOLONE ACETONIDE 10 MG/ML IJ SUSP
10.0000 mg | Freq: Once | INTRAMUSCULAR | Status: AC
  Administered 2014-08-17: 10 mg

## 2014-08-17 NOTE — Progress Notes (Signed)
Subjective:     Patient ID: Pamela Lowe, female   DOB: Jan 01, 1951, 64 y.o.   MRN: [id number]  HPI patient presents stating my left foot is improved on top but it hurts underneath and I know I need the left bunion and toe fixed but I like to wait until November as my husband is having hip surgery   Review of Systems     Objective:   Physical Exam Neurovascular status intact muscle strength is adequate range of motion within normal limits with at this time discomfort in the medial fascial band left with significant flatfoot deformity noted and hyperostosis medial aspect first metatarsal left with toe left that are painful when pressed    Assessment:     Structural deformity of the left foot with plantar fascial symptomatology and improvement of dorsal tendinitis    Plan:     Injected the plantar fascial left 3 mg Kenalog 5 mg Xylocaine and scanned for custom orthotic devices and discussed [place] bunionectomy and arthroplasty fifth toe left to be performed in November

## 2014-09-07 ENCOUNTER — Ambulatory Visit: Payer: BLUE CROSS/BLUE SHIELD | Admitting: Podiatry

## 2014-09-10 ENCOUNTER — Encounter: Payer: Self-pay | Admitting: Podiatry

## 2014-09-10 ENCOUNTER — Ambulatory Visit (INDEPENDENT_AMBULATORY_CARE_PROVIDER_SITE_OTHER): Payer: BLUE CROSS/BLUE SHIELD | Admitting: Podiatry

## 2014-09-10 VITALS — BP 140/90 | HR 64 | Resp 12

## 2014-09-10 DIAGNOSIS — M2012 Hallux valgus (acquired), left foot: Secondary | ICD-10-CM | POA: Diagnosis not present

## 2014-09-10 DIAGNOSIS — M79672 Pain in left foot: Secondary | ICD-10-CM

## 2014-09-10 DIAGNOSIS — M722 Plantar fascial fibromatosis: Secondary | ICD-10-CM

## 2014-09-10 DIAGNOSIS — M21612 Bunion of left foot: Secondary | ICD-10-CM

## 2014-09-10 NOTE — Patient Instructions (Signed)

## 2014-09-11 NOTE — Progress Notes (Signed)
Subjective:     Patient ID: Pamela Lowe, female   DOB: 09/18/1950, 64 y.o.   MRN: [id number]  HPI patient states I'm still having quite a bit of pain and Dr. Lenna Gilford thinks I might have rheumatoid arthritis and is checking before   Review of Systems     Objective:   Physical Exam Neurovascular status intact muscle strength was found to be adequate and I did note that there is continued discomfort in the left plantar fascia especially after periods of sitting and when doing extensive walking. States she feels like it needs to be stretched    Assessment:     Plantar fasciitis left with inflammation    Plan:     At this point advised on physical therapy for this and I dispensed a night splint with instructions on usage and will be seen back to reevaluate. Reappoint in approximate 4 weeks

## 2014-12-09 ENCOUNTER — Ambulatory Visit: Payer: BLUE CROSS/BLUE SHIELD | Admitting: Podiatry

## 2015-04-26 ENCOUNTER — Ambulatory Visit
Admission: RE | Admit: 2015-04-26 | Discharge: 2015-04-26 | Disposition: A | Discharge status: Home / Self Care | Payer: BLUE CROSS/BLUE SHIELD | Source: Ambulatory Visit | Attending: Obstetrics & Gynecology | Admitting: Obstetrics & Gynecology

## 2015-04-26 ENCOUNTER — Encounter: Payer: Self-pay | Admitting: Obstetrics & Gynecology

## 2015-04-26 ENCOUNTER — Other Ambulatory Visit: Payer: Self-pay | Admitting: Obstetrics & Gynecology

## 2015-04-26 ENCOUNTER — Ambulatory Visit (INDEPENDENT_AMBULATORY_CARE_PROVIDER_SITE_OTHER): Payer: BLUE CROSS/BLUE SHIELD | Admitting: Obstetrics & Gynecology

## 2015-04-26 VITALS — BP 140/80 | HR 72 | Resp 16 | Ht 66.25 in | Wt 200.0 lb

## 2015-04-26 DIAGNOSIS — Z124 Encounter for screening for malignant neoplasm of cervix: Secondary | ICD-10-CM | POA: Diagnosis not present

## 2015-04-26 DIAGNOSIS — R059 Cough, unspecified: Secondary | ICD-10-CM

## 2015-04-26 DIAGNOSIS — Z01419 Encounter for gynecological examination (general) (routine) without abnormal findings: Secondary | ICD-10-CM

## 2015-04-26 DIAGNOSIS — R05 Cough: Secondary | ICD-10-CM

## 2015-04-26 DIAGNOSIS — Z0000 Encounter for general adult medical examination without abnormal findings: Secondary | ICD-10-CM | POA: Diagnosis not present

## 2015-04-26 LAB — POCT URINALYSIS DIPSTICK
Bilirubin, UA: NEGATIVE
Blood, UA: NEGATIVE
GLUCOSE UA: NEGATIVE
Ketones, UA: NEGATIVE
Nitrite, UA: NEGATIVE
Protein, UA: NEGATIVE
UROBILINOGEN UA: NEGATIVE
pH, UA: 5

## 2015-04-26 NOTE — Progress Notes (Addendum)
65 y.o. G1P0010 MarriedAfrican AmericanF here for annual exam.  Doing well.  Denies vaginal bleeding.    Reports seeing Dr. Watt Climes about two months about.  Is on pantoprazole and align.  Feels these are helping.  Colonoscopy is due this fall.  Pt aware.    PCP:  Dr. Chapman Fitch.    Patient's last menstrual period was 03/19/1998.          Sexually active: No.  The current method of family planning is status post hysterectomy.    Exercising: Yes.    hiking, exercise group  Smoker:  yes  Health Maintenance: Pap: 04/06/13 Neg. 03/01/2011 neg. HR HPV:Neg History of abnormal Pap:  no MMG: 04/23/14 BIRADS2:Benign  Colonoscopy:  11/01/2010 Polyps - repeat 5 years  BMD:  02/2010 Normal  TDaP:  2008 w/ PCP Screening Labs: PCP, Hb today: PCP, Urine today: WBC=Trace   reports that she has been smoking Cigarettes.  She has a 7.5 pack-year smoking history. She has never used smokeless tobacco. She reports that she drinks alcohol. She reports that she does not use illicit drugs.  Past Medical History  Diagnosis Date  . Anemia   . Hyperlipidemia   . Breast cyst 2007    Right  . GERD (gastroesophageal reflux disease)   . Tobacco user   . Depression 2006    w/ menopause  . Hypertension   . Allergic rhinitis   . Multinodular goiter   . History of hysterectomy, supracervical     Past Surgical History  Procedure Laterality Date  . Total abdominal hysterectomy  1986    h/o supracervical hysterectomy  . Breast biopsy      Current Outpatient Prescriptions  Medication Sig Dispense Refill  . benazepril (LOTENSIN) 20 MG tablet Take 1 tablet (20 mg total) by mouth daily.    [name] CRANBERRY PO Take by mouth daily.    . fluticasone (FLONASE) 50 MCG/ACT nasal spray Place 2 sprays into the nose daily.    . hydrochlorothiazide (,MICROZIDE/HYDRODIURIL,) 12.5 MG capsule Take 12.5 mg by mouth daily.      [name] loratadine (CLARITIN) 10 MG tablet Take 10 mg by mouth daily. Has not started this yet    . naproxen  (NAPROSYN) 500 MG tablet Take 500 mg by mouth 2 (two) times daily with a meal.    . Omega-3 Fatty Acids (FISH OIL PO) Take by mouth daily.    . pantoprazole (PROTONIX) 40 MG tablet Take 40 mg by mouth daily.    . Probiotic Product (RESTORA PO) Take by mouth daily.     No current facility-administered medications for this visit.    Family History  Problem Relation Age of Onset  . Kidney cancer Father   . Pneumonia Father   . Hypertension Mother   . Deep vein thrombosis Mother   . Alzheimer's disease Mother   . Heart attack Maternal Grandfather   . Breast cancer Sister 58  . Allergies Brother   . Allergies Sister   . Allergies Sister   . Heart attack Brother   . Clotting disorder Sister   . Cancer Other     3 paternal uncles: stomach, brain, and colon CA  . Cancer Other     3 paternal aunts: colon, breast, and unsure  . Hypertension Father   . Deep vein thrombosis Sister     unclear cause  . Breast cancer Paternal Aunt   . Osteopenia Mother     ROS:  Pertinent items are noted in HPI.  Otherwise,  a comprehensive ROS was negative.  Exam:   BP 140/80 mmHg  Pulse 72  Resp 16  Ht 5' 6.25" (1.683 m)  Wt 200 lb (90.719 kg)  BMI 32.03 kg/m2  LMP 03/19/1998  Weight change: +19#  Height: 5' 6.25" (168.3 cm)  Ht Readings from Last 3 Encounters:  04/26/15 5' 6.25" (1.683 m)  04/16/14 5' 7.25" (1.708 m)  07/13/13 5' 6.75" (1.695 m)    General appearance: alert, cooperative and appears stated age Head: Normocephalic, without obvious abnormality, atraumatic Neck: no adenopathy, supple, symmetrical, trachea midline and thyroid normal to inspection and palpation Lungs: clear to auscultation bilaterally Breasts: normal appearance, no masses or tenderness Heart: regular rate and rhythm Abdomen: soft, non-tender; bowel sounds normal; no masses,  no organomegaly Extremities: extremities normal, atraumatic, no cyanosis or edema Skin: Skin color, texture, turgor normal. No rashes or  lesions Lymph nodes: Cervical, supraclavicular, and axillary nodes normal. No abnormal inguinal nodes palpated Neurologic: Grossly normal   Pelvic: External genitalia:  no lesions              Urethra:  normal appearing urethra with no masses, tenderness or lesions              Bartholins and Skenes: normal                 Vagina: normal appearing vagina with normal color and discharge, no lesions              Cervix: absent              Pap taken: No. Bimanual Exam:  Uterus:  uterus absent              Adnexa: normal adnexa and no mass, fullness, tenderness               Rectovaginal: Confirms               Anus:  normal sphincter tone, no lesions  Chaperone was present for exam.  A:  Well Woman with normal exam PMP, now off HRT H/O supracervical hysterectomy 2000 due to fibroids, endometriosis Elevated lipid Hypertension Family hx of breast cancer in paternal aunt and sister Recurrent "uti's" with negative cultures. Has seen Dr. Matilde Sprang ffor evaluation H/O cigarette smoking with recent cough Family hx of DVT in mother and sister  P: Mammogram yearly.  pap smear with neg HR HPV 12/12. Pap 1/15.  Pap with HR HPV today. Plan BMD at age 51 Pt aware of recommendation of Hep C testing.  Pt has given blood in the past.  Pt thinks she did this with work as well.  Advised to check into this. Seeing Dr. Trudie Reed yearly.  Pt reports she is being watched for rheumatoid arthritis CXR ordered return annually or prn

## 2015-04-26 NOTE — Addendum Note (Signed)
Addended by: Megan Salon on: 04/26/2015 02:30 PM   Modules accepted: Miquel Dunn

## 2015-04-26 NOTE — Progress Notes (Signed)
Provided patient address to [place] for [facility]. Patient has paper order to be seen today as a walk in for a chest x-ray. Aware walk-in hours are until 4:30 pm today. She will go now to be seen. Placed in imaging hold.

## 2015-04-27 NOTE — Progress Notes (Signed)
Quick Note:  Pt notified via Mychart of normal CXR. ______

## 2015-04-28 LAB — IPS PAP TEST WITH HPV

## 2015-06-17 ENCOUNTER — Ambulatory Visit: Payer: BLUE CROSS/BLUE SHIELD | Admitting: Obstetrics & Gynecology

## 2015-10-13 ENCOUNTER — Other Ambulatory Visit: Payer: Self-pay | Admitting: Family Medicine

## 2015-10-13 DIAGNOSIS — E010 Iodine-deficiency related diffuse (endemic) goiter: Secondary | ICD-10-CM

## 2015-10-14 ENCOUNTER — Ambulatory Visit
Admission: RE | Admit: 2015-10-14 | Discharge: 2015-10-14 | Disposition: A | Discharge status: Home / Self Care | Payer: BLUE CROSS/BLUE SHIELD | Source: Ambulatory Visit | Attending: Family Medicine | Admitting: Family Medicine

## 2015-10-14 DIAGNOSIS — E010 Iodine-deficiency related diffuse (endemic) goiter: Secondary | ICD-10-CM

## 2015-12-26 ENCOUNTER — Ambulatory Visit
Admission: RE | Admit: 2015-12-26 | Discharge: 2015-12-26 | Disposition: A | Discharge status: Home / Self Care | Payer: BLUE CROSS/BLUE SHIELD | Source: Ambulatory Visit | Attending: Family Medicine | Admitting: Family Medicine

## 2015-12-26 ENCOUNTER — Other Ambulatory Visit: Payer: Self-pay | Admitting: Family Medicine

## 2015-12-26 DIAGNOSIS — R079 Chest pain, unspecified: Secondary | ICD-10-CM

## 2016-01-30 ENCOUNTER — Encounter: Payer: Self-pay | Admitting: Obstetrics & Gynecology

## 2016-03-08 ENCOUNTER — Other Ambulatory Visit: Payer: Self-pay | Admitting: Surgery

## 2016-03-08 DIAGNOSIS — I7092 Chronic total occlusion of artery of the extremities: Secondary | ICD-10-CM

## 2016-03-28 ENCOUNTER — Ambulatory Visit (INDEPENDENT_AMBULATORY_CARE_PROVIDER_SITE_OTHER): Payer: BLUE CROSS/BLUE SHIELD | Admitting: Vascular Surgery

## 2016-03-28 ENCOUNTER — Ambulatory Visit (HOSPITAL_COMMUNITY)
Admission: RE | Admit: 2016-03-28 | Discharge: 2016-03-28 | Disposition: A | Discharge status: Home / Self Care | Payer: BLUE CROSS/BLUE SHIELD | Source: Ambulatory Visit | Attending: Vascular Surgery | Admitting: Vascular Surgery

## 2016-03-28 ENCOUNTER — Encounter: Payer: Self-pay | Admitting: Vascular Surgery

## 2016-03-28 VITALS — BP 110/71 | HR 73 | Temp 98.6°F | Resp 20 | Ht 66.25 in | Wt 183.4 lb

## 2016-03-28 DIAGNOSIS — I739 Peripheral vascular disease, unspecified: Secondary | ICD-10-CM

## 2016-03-28 DIAGNOSIS — I7092 Chronic total occlusion of artery of the extremities: Secondary | ICD-10-CM | POA: Diagnosis present

## 2016-03-28 LAB — VAS US LOWER EXTREMITY ARTERIAL DUPLEX
RIGHT ANT DIST TIBAL SYS PSV: 47 cm/s
RSFPPSV: 91 cm/s
RTIBDISTSYS: 20 cm/s
Right super femoral dist sys PSV: -38 cm/s
Right super femoral mid sys PSV: -54 cm/s

## 2016-03-28 NOTE — Progress Notes (Signed)
[facility]  Patient name: Pamela Lowe MRN: [id number] DOB: 1950/08/05 Sex: female  REASON FOR VISIT: Peripheral arterial disease, referred by Dr. Gus Height  HPI: Pamela Lowe is a 66 y.o. female, who presents for evaluation of right leg pain. The patient previously had chest pain and underwent stress testing, ABIs and a carotid duplex. Her stress test was normal and her symptoms were attributed to reflux. Her ABIs were abnormal in the right leg, and she was referred here. The patient has complained of right hip pain for years, but this pain occurs only when she sleeps on her right side at night. The patient stays active and walks approximately 6000-10,000 steps a day. She denies any hip, buttock and thigh or calf claudication. She can walk a mile without symptoms. She denies any nonhealing wounds or rest pain. She does complain of some cramps in her right leg at night.   The patient denies history of amaurosis fugax, sudden numbness or weakness of the extremities and slurred speech.  She has a past medical history of hypertension, hyperlipidemia and tobacco abuse. She smokes half pack to one pack daily. He is trying to quit smoking. Takes aspirin daily and a statin.  She has no prior history of cardiac issues. Her brother did pass away from a heart attack at age 59.  Past Medical History:  Diagnosis Date  . Allergic rhinitis   . Anemia   . Breast cyst 2007   Right  . Depression 2006   w/ menopause  . GERD (gastroesophageal reflux disease)   . History of hysterectomy, supracervical   . Hyperlipidemia   . Hypertension   . Multinodular goiter   . Tobacco user     Family History  Problem Relation Age of Onset  . Kidney cancer Father   . Pneumonia Father   . Hypertension Father   . Hypertension Mother   . Deep vein thrombosis Mother   . Alzheimer's disease Mother   . Osteopenia Mother   . Heart attack Maternal Grandfather   . Breast cancer  Sister 41  . Allergies Brother   . Allergies Sister   . Allergies Sister   . Heart attack Brother   . Clotting disorder Sister   . Cancer Other     3 paternal uncles: stomach, brain, and colon CA  . Cancer Other     3 paternal aunts: colon, breast, and unsure  . Deep vein thrombosis Sister     unclear cause  . Breast cancer Paternal Aunt     SOCIAL HISTORY: Social History   Social History  . Marital status: Married    Spouse name: [name]  . Number of children: N/A  . Years of education: N/A   Occupational History  . Special Ed Teacher     [employer]   Social History Main Topics  . Smoking status: Current Every Day Smoker    Packs/day: 0.25    Years: 30.00    Types: Cigarettes  . Smokeless tobacco: Never Used     Comment: using nicoderm patches  . Alcohol use 0.0 oz/week     Comment: very rare  . Drug use: No  . Sexual activity: No     Comment: Hysterectomy   Other Topics Concern  . Not on file   Social History Narrative  . No narrative on file    Allergies  Allergen Reactions  . Wellbutrin [Bupropion Hcl]     rash    Current  Outpatient Prescriptions  Medication Sig Dispense Refill  . benazepril (LOTENSIN) 20 MG tablet Take 1 tablet (20 mg total) by mouth daily.    . hydrochlorothiazide (,MICROZIDE/HYDRODIURIL,) 12.5 MG capsule Take 12.5 mg by mouth daily.      . Omega-3 Fatty Acids (FISH OIL PO) Take by mouth daily.    [name] CRANBERRY PO Take by mouth daily.    [name] esomeprazole (NEXIUM) 20 MG capsule Take by mouth.    . fluticasone (FLONASE) 50 MCG/ACT nasal spray Place 2 sprays into the nose daily.    [name] loratadine (CLARITIN) 10 MG tablet Take 10 mg by mouth daily. Has not started this yet    . naproxen (NAPROSYN) 500 MG tablet Take 500 mg by mouth 2 (two) times daily with a meal.    . pantoprazole (PROTONIX) 40 MG tablet Take 40 mg by mouth daily.    . Probiotic Product (RESTORA PO) Take by mouth daily.     No current facility-administered medications  for this visit.     REVIEW OF SYSTEMS:  [X]  denotes positive finding, [ ]  denotes negative finding Cardiac  Comments:  Chest pain or chest pressure:    Shortness of breath upon exertion:    Short of breath when lying flat:    Irregular heart rhythm:        Vascular    Pain in calf, thigh, or hip brought on by ambulation:    Pain in feet at night that wakes you up from your sleep:     Blood clot in your veins:    Leg swelling:         Pulmonary    Oxygen at home:    Productive cough:     Wheezing:         Neurologic    Sudden weakness in arms or legs:     Sudden numbness in arms or legs:     Sudden onset of difficulty speaking or slurred speech:    Temporary loss of vision in one eye:     Problems with dizziness:         Gastrointestinal    Blood in stool:     Vomited blood:         Genitourinary    Burning when urinating:     Blood in urine:        Psychiatric    Major depression:         Hematologic    Bleeding problems:    Problems with blood clotting too easily:        Skin    Rashes or ulcers:        Constitutional    Fever or chills:      PHYSICAL EXAM: Vitals:   03/28/16 1414  BP: 110/71  Pulse: 73  Resp: 20  Temp: 98.6 F (37 C)  TempSrc: Oral  SpO2: 98%  Weight: 183 lb 6.4 oz (83.2 kg)  Height: 5' 6.25" (1.683 m)    GENERAL: The patient is a well-nourished female, in no acute distress. The vital signs are documented above. CARDIAC: There is a regular rate and rhythm.  No carotid bruits.  VASCULAR: 2+ radial pulses, 2+ femoral pulses, 2+ dorsalis pedis pulses bilaterally. Nonpalpable right posterior tibial pulse. 2+ left posterior tibial pulse. PULMONARY: There is good air exchange bilaterally without wheezing or rales. ABDOMEN: Soft and non-tender with normal pitched bowel sounds.  no pulsatile mass.  MUSCULOSKELETAL: There are no major deformities or cyanosis. NEUROLOGIC: No focal weakness  or paresthesias are detected. SKIN: There are  no ulcers or rashes noted. PSYCHIATRIC: The patient has a normal affect.  DATA:  ABIs and RLE arterial duplex 03/28/2016  R: 0.86 monophasic posterior tibial and anterior tibial waveforms, likely proximal/mid right SFA artery occlusion  L: 1.05   Records reviewed from [facility] carotid duplex 12/02/15 revealing normal waveforms and peak systolic velocities.   MEDICAL ISSUES: PAD  The patient has a likely right SFA occlusion. However, she is not having any claudication symptoms in either leg. No indication for further workup at this time. She was encouraged to quit smoking and to continue staying active. Also advised her to continue taking a daily aspirin and statin. She will follow with [place] on an as-needed basis.   Virgina Jock, PA-C [facility] MD: Crimson Beer  I have examined the patient, reviewed and agree with above. Patient does have SFA occlusive disease on the right but no symptoms related to this. Discussed symptoms of lower from the arterial insufficiency and also regarding possible tissue loss which she isn't minimal if any risk 4. We'll continue her exercise program and see [place] on as-needed basis  Curt Jews, MD 03/28/2016 3:29 PM

## 2016-04-23 ENCOUNTER — Encounter: Payer: BLUE CROSS/BLUE SHIELD | Admitting: Surgery

## 2016-04-23 ENCOUNTER — Encounter (HOSPITAL_COMMUNITY): Payer: BLUE CROSS/BLUE SHIELD

## 2016-05-03 ENCOUNTER — Encounter: Payer: Self-pay | Admitting: Obstetrics & Gynecology

## 2016-05-28 ENCOUNTER — Ambulatory Visit (INDEPENDENT_AMBULATORY_CARE_PROVIDER_SITE_OTHER): Payer: BLUE CROSS/BLUE SHIELD

## 2016-05-28 ENCOUNTER — Ambulatory Visit (INDEPENDENT_AMBULATORY_CARE_PROVIDER_SITE_OTHER): Payer: BLUE CROSS/BLUE SHIELD | Admitting: Podiatry

## 2016-05-28 ENCOUNTER — Encounter: Payer: Self-pay | Admitting: Podiatry

## 2016-05-28 DIAGNOSIS — M7751 Other enthesopathy of right foot: Secondary | ICD-10-CM | POA: Diagnosis not present

## 2016-05-28 DIAGNOSIS — M2041 Other hammer toe(s) (acquired), right foot: Secondary | ICD-10-CM

## 2016-05-28 DIAGNOSIS — M79674 Pain in right toe(s): Secondary | ICD-10-CM

## 2016-05-28 DIAGNOSIS — M779 Enthesopathy, unspecified: Secondary | ICD-10-CM

## 2016-05-28 MED ORDER — TRIAMCINOLONE ACETONIDE 10 MG/ML IJ SUSP
10.0000 mg | Freq: Once | INTRAMUSCULAR | Status: AC
  Administered 2016-05-28: 10 mg

## 2016-05-28 NOTE — Progress Notes (Signed)
Subjective:     Patient ID: Pamela Lowe, female   DOB: 10-31-1950, 66 y.o.   MRN: [id number]  HPI patient points fourth digit right foot stating it's been quite tender and making it difficult to wear shoe gear with comfortably. Patient states she's tried wider shoes and she's been quite a bit more active lately with exercising and has lost 50 pounds   Review of Systems  All other systems reviewed and are negative.      Objective:   Physical Exam  Constitutional: She is oriented to person, place, and time.  Cardiovascular: Intact distal pulses.   Musculoskeletal: Normal range of motion.  Neurological: She is oriented to person, place, and time.  Skin: Skin is warm.  Nursing note and vitals reviewed.  Neurovascular status intact muscle strength adequate range of motion within normal limits with patient found to have swelling and pain at the interphalangeal joint digit for right with also digital deformity of the interphalangeal joint with keratotic lesion on the lateral side of the toe     Assessment:     Hammertoe deformity with inflammatory capsulitis of the interphalangeal joint digit for right    Plan:     H&P x-rays reviewed conditions discussed. At this time I did proximal nerve block I carefully injected the interphalangeal joint with 2 mg dexamethasone Kenalog 2 mg Xylocaine and I instructed on the possibility for hammertoe repair in future depending on response. Reappoint to recheck  X-ray report indicates there is slight narrowing of the distal interphalangeal joint digit for right but no indications of acute arthritis

## 2016-07-05 DIAGNOSIS — E7800 Pure hypercholesterolemia, unspecified: Secondary | ICD-10-CM | POA: Diagnosis not present

## 2016-07-05 DIAGNOSIS — Z79899 Other long term (current) drug therapy: Secondary | ICD-10-CM | POA: Diagnosis not present

## 2016-07-05 DIAGNOSIS — I10 Essential (primary) hypertension: Secondary | ICD-10-CM | POA: Diagnosis not present

## 2016-07-05 DIAGNOSIS — E785 Hyperlipidemia, unspecified: Secondary | ICD-10-CM | POA: Diagnosis not present

## 2016-07-09 DIAGNOSIS — H2513 Age-related nuclear cataract, bilateral: Secondary | ICD-10-CM | POA: Diagnosis not present

## 2016-07-09 DIAGNOSIS — H25013 Cortical age-related cataract, bilateral: Secondary | ICD-10-CM | POA: Diagnosis not present

## 2016-07-09 DIAGNOSIS — H40023 Open angle with borderline findings, high risk, bilateral: Secondary | ICD-10-CM | POA: Diagnosis not present

## 2016-07-09 DIAGNOSIS — H04123 Dry eye syndrome of bilateral lacrimal glands: Secondary | ICD-10-CM | POA: Diagnosis not present

## 2016-07-20 DIAGNOSIS — M7630 Iliotibial band syndrome, unspecified leg: Secondary | ICD-10-CM | POA: Diagnosis not present

## 2016-07-20 DIAGNOSIS — E785 Hyperlipidemia, unspecified: Secondary | ICD-10-CM | POA: Diagnosis not present

## 2016-07-26 DIAGNOSIS — M7632 Iliotibial band syndrome, left leg: Secondary | ICD-10-CM | POA: Diagnosis not present

## 2016-07-26 DIAGNOSIS — M7631 Iliotibial band syndrome, right leg: Secondary | ICD-10-CM | POA: Diagnosis not present

## 2016-07-26 DIAGNOSIS — M79606 Pain in leg, unspecified: Secondary | ICD-10-CM | POA: Diagnosis not present

## 2016-07-30 DIAGNOSIS — M79606 Pain in leg, unspecified: Secondary | ICD-10-CM | POA: Diagnosis not present

## 2016-07-30 DIAGNOSIS — M7631 Iliotibial band syndrome, right leg: Secondary | ICD-10-CM | POA: Diagnosis not present

## 2016-07-30 DIAGNOSIS — M7632 Iliotibial band syndrome, left leg: Secondary | ICD-10-CM | POA: Diagnosis not present

## 2016-08-03 DIAGNOSIS — M79606 Pain in leg, unspecified: Secondary | ICD-10-CM | POA: Diagnosis not present

## 2016-08-03 DIAGNOSIS — M7632 Iliotibial band syndrome, left leg: Secondary | ICD-10-CM | POA: Diagnosis not present

## 2016-08-03 DIAGNOSIS — M7631 Iliotibial band syndrome, right leg: Secondary | ICD-10-CM | POA: Diagnosis not present

## 2016-08-06 DIAGNOSIS — M7631 Iliotibial band syndrome, right leg: Secondary | ICD-10-CM | POA: Diagnosis not present

## 2016-08-06 DIAGNOSIS — M7632 Iliotibial band syndrome, left leg: Secondary | ICD-10-CM | POA: Diagnosis not present

## 2016-08-06 DIAGNOSIS — M79606 Pain in leg, unspecified: Secondary | ICD-10-CM | POA: Diagnosis not present

## 2016-08-07 ENCOUNTER — Encounter: Payer: Self-pay | Admitting: Obstetrics & Gynecology

## 2016-08-07 ENCOUNTER — Ambulatory Visit (INDEPENDENT_AMBULATORY_CARE_PROVIDER_SITE_OTHER): Payer: Medicare Other | Admitting: Obstetrics & Gynecology

## 2016-08-07 VITALS — BP 110/62 | HR 82 | Resp 16 | Ht 66.0 in | Wt 194.0 lb

## 2016-08-07 DIAGNOSIS — Z124 Encounter for screening for malignant neoplasm of cervix: Secondary | ICD-10-CM | POA: Diagnosis not present

## 2016-08-07 DIAGNOSIS — Z0000 Encounter for general adult medical examination without abnormal findings: Secondary | ICD-10-CM | POA: Diagnosis not present

## 2016-08-07 DIAGNOSIS — R413 Other amnesia: Secondary | ICD-10-CM

## 2016-08-07 DIAGNOSIS — Z01419 Encounter for gynecological examination (general) (routine) without abnormal findings: Secondary | ICD-10-CM

## 2016-08-07 LAB — POCT URINALYSIS DIPSTICK
Bilirubin, UA: NEGATIVE
Blood, UA: NEGATIVE
Glucose, UA: NEGATIVE
KETONES UA: NEGATIVE
NITRITE UA: NEGATIVE
PH UA: 5 (ref 5.0–8.0)
PROTEIN UA: NEGATIVE
UROBILINOGEN UA: 0.2 U/dL

## 2016-08-07 NOTE — Progress Notes (Signed)
66 y.o. G1P0010 MarriedAfrican AmericanF here for annual exam.  Doing well.  No vaginal bleeding.  Had chest pains last fall.  Went to [facility] urgent Care.  Had carotid testing and myocardial perfusion test.  Also had superficial femoral artery occlusion.  Saw Dr. Donnetta Hutching.  Had now had redirection.  Had no claudication symptoms.  Dr. Donnetta Hutching did not recommend any intervention or additional evaluation.  Pt has some concerns about memory.  Mother had dementia.  Pt has a lot of anxiety about this.  Would like evaluation.  Major issue with memory is recall of things she feels like she shouldn't forget.    Patient's last menstrual period was 03/19/1998.          Sexually active: No.  The current method of family planning is status post hysterectomy.    Exercising: Yes.    hiking, walking Smoker:  yes  Health Maintenance: Pap:  04/26/15 Neg. HR HPV:neg   04/06/13 Neg  History of abnormal Pap:  no MMG:  04/27/16 BIRADS2:Benign  Colonoscopy:  2017, Dr. Watt Climes, polyps, follow up 5 years BMD:   02/2010 Normal  TDaP:  2008  Pneumonia vaccine(s):  No Zostavax:   Done with PCP Hep C testing: did at work Screening Labs: PCP, Urine today: pending   reports that she has been smoking Cigarettes.  She has a 7.50 pack-year smoking history. She has never used smokeless tobacco. She reports that she drinks alcohol. She reports that she does not use drugs.  Past Medical History:  Diagnosis Date  . Allergic rhinitis   . Anemia   . Breast cyst 2007   Right  . Depression 2006   w/ menopause  . GERD (gastroesophageal reflux disease)   . History of hysterectomy, supracervical   . Hyperlipidemia   . Hypertension   . Multinodular goiter   . Tobacco user     Past Surgical History:  Procedure Laterality Date  . BREAST BIOPSY    . TOTAL ABDOMINAL HYSTERECTOMY  1986   h/o supracervical hysterectomy    Current Outpatient Prescriptions  Medication Sig Dispense Refill  . benazepril (LOTENSIN) 20 MG  tablet Take 1 tablet (20 mg total) by mouth daily.    [name] CRANBERRY PO Take by mouth daily.    [name] esomeprazole (NEXIUM) 20 MG capsule Take by mouth.    . fluticasone (FLONASE) 50 MCG/ACT nasal spray Place 2 sprays into the nose daily.    . hydrochlorothiazide (,MICROZIDE/HYDRODIURIL,) 12.5 MG capsule Take 12.5 mg by mouth daily.      [name] loratadine (CLARITIN) 10 MG tablet Take 10 mg by mouth daily. Has not started this yet    . Omega-3 Fatty Acids (FISH OIL PO) Take by mouth daily.    . Probiotic Product (RESTORA PO) Take by mouth daily.    . rosuvastatin (CRESTOR) 40 MG tablet Take 40 mg by mouth daily.     No current facility-administered medications for this visit.     Family History  Problem Relation Age of Onset  . Kidney cancer Father   . Pneumonia Father   . Hypertension Father   . Hypertension Mother   . Deep vein thrombosis Mother   . Alzheimer's disease Mother   . Osteopenia Mother   . Heart attack Maternal Grandfather   . Breast cancer Sister 11  . Allergies Brother   . Allergies Sister   . Allergies Sister   . Heart attack Brother   . Clotting disorder Sister   . Cancer Other  3 paternal uncles: stomach, brain, and colon CA  . Cancer Other        3 paternal aunts: colon, breast, and unsure  . Deep vein thrombosis Sister        unclear cause  . Breast cancer Paternal Aunt     ROS:  Pertinent items are noted in HPI.  Otherwise, a comprehensive ROS was negative.  Exam:   BP 110/62 (BP Location: Right Arm, Patient Position: Sitting, Cuff Size: Large)   Pulse 82   Resp 16   Ht 5\' 6"  (1.676 m)   Wt 194 lb (88 kg)   LMP 03/19/1998 Comment: h/o supracervical hysterectomy  BMI 31.31 kg/m   Height: 5\' 6"  (167.6 cm)  Ht Readings from Last 3 Encounters:  08/07/16 5\' 6"  (1.676 m)  03/28/16 5' 6.25" (1.683 m)  04/26/15 5' 6.25" (1.683 m)    General appearance: alert, cooperative and appears stated age Head: Normocephalic, without obvious abnormality,  atraumatic Neck: no adenopathy, supple, symmetrical, trachea midline and thyroid normal to inspection and palpation Lungs: clear to auscultation bilaterally Breasts: normal appearance, no masses or tenderness Heart: regular rate and rhythm Abdomen: soft, non-tender; bowel sounds normal; no masses,  no organomegaly Extremities: extremities normal, atraumatic, no cyanosis or edema Skin: Skin color, texture, turgor normal. No rashes or lesions Lymph nodes: Cervical, supraclavicular, and axillary nodes normal. No abnormal inguinal nodes palpated Neurologic: Grossly normal  Pelvic: External genitalia:  no lesions              Urethra:  normal appearing urethra with no masses, tenderness or lesions              Bartholins and Skenes: normal                 Vagina: normal appearing vagina with normal color and discharge, no lesions              Cervix: no lesions              Pap taken: No. Bimanual Exam:  Uterus:  uterus absent              Adnexa: no mass, fullness, tenderness               Rectovaginal: Confirms               Anus:  normal sphincter tone, no lesions  Chaperone was present for exam.  A:  Well Woman with normal exam PMP, no HRT H/O supracervical hysterectomy due to fibroids and endometriosis, 2000 Elevated lipids Hypertension Family hs of breast cancer in paternal aunt and sister Family hx of DVT in mother and sister Memory changes with recall  P:   Mammogram guidelines reviewed pap smear not indicated this year D/W pt Shingrix, Tdap, pneumonia vaccinations Blood work is all up to date with Dr. Harrington Challenger. Referral neurology Return annually or prn

## 2016-08-07 NOTE — Patient Instructions (Addendum)
I want you to do a bone density with your next mammogram.  Can you check your [facility] records to make sure you haven't had one since 2011?  I have your last tetanus shot as being in 2008.  If this is correct, you need this updated.

## 2016-08-10 DIAGNOSIS — M7631 Iliotibial band syndrome, right leg: Secondary | ICD-10-CM | POA: Diagnosis not present

## 2016-08-10 DIAGNOSIS — M79606 Pain in leg, unspecified: Secondary | ICD-10-CM | POA: Diagnosis not present

## 2016-08-10 DIAGNOSIS — M7632 Iliotibial band syndrome, left leg: Secondary | ICD-10-CM | POA: Diagnosis not present

## 2016-08-14 DIAGNOSIS — M79606 Pain in leg, unspecified: Secondary | ICD-10-CM | POA: Diagnosis not present

## 2016-08-14 DIAGNOSIS — M7632 Iliotibial band syndrome, left leg: Secondary | ICD-10-CM | POA: Diagnosis not present

## 2016-08-14 DIAGNOSIS — M7631 Iliotibial band syndrome, right leg: Secondary | ICD-10-CM | POA: Diagnosis not present

## 2016-08-16 ENCOUNTER — Encounter: Payer: Self-pay | Admitting: Obstetrics & Gynecology

## 2016-08-17 DIAGNOSIS — M7632 Iliotibial band syndrome, left leg: Secondary | ICD-10-CM | POA: Diagnosis not present

## 2016-08-17 DIAGNOSIS — M7631 Iliotibial band syndrome, right leg: Secondary | ICD-10-CM | POA: Diagnosis not present

## 2016-08-17 DIAGNOSIS — M79606 Pain in leg, unspecified: Secondary | ICD-10-CM | POA: Diagnosis not present

## 2016-09-21 ENCOUNTER — Encounter: Payer: Self-pay | Admitting: Neurology

## 2016-09-21 ENCOUNTER — Ambulatory Visit (INDEPENDENT_AMBULATORY_CARE_PROVIDER_SITE_OTHER): Payer: Medicare Other | Admitting: Neurology

## 2016-09-21 DIAGNOSIS — I10 Essential (primary) hypertension: Secondary | ICD-10-CM | POA: Diagnosis not present

## 2016-09-21 DIAGNOSIS — R7989 Other specified abnormal findings of blood chemistry: Secondary | ICD-10-CM

## 2016-09-21 DIAGNOSIS — G4700 Insomnia, unspecified: Secondary | ICD-10-CM | POA: Insufficient documentation

## 2016-09-21 DIAGNOSIS — E559 Vitamin D deficiency, unspecified: Secondary | ICD-10-CM

## 2016-09-21 DIAGNOSIS — R413 Other amnesia: Secondary | ICD-10-CM | POA: Diagnosis not present

## 2016-09-21 DIAGNOSIS — I7092 Chronic total occlusion of artery of the extremities: Secondary | ICD-10-CM

## 2016-09-21 NOTE — Progress Notes (Signed)
[facility]  PATIENT: Pamela Lowe DOB: 09/19/1950  REFERRING DOCTOR OR PCP:  Lona Kettle  SOURCE: patient, notes from Dr. Harrington Challenger, labs in [place]  _________________________________   HISTORICAL  CHIEF COMPLAINT:  Chief Complaint  Patient presents with  . Memory Loss    [name] is here for eval of forgetfulness over the last yr.  For ex.: can't remember if she closed the garage door, unplugged the iron, can't remember how old her parents were when they passed away, or the yrs. that they died.  She lives with her husband.  Retired at age 70.  She was a Engineer, structural for 21 yrs., then went back to school and got her Masters Degree in special education, worked as a Agricultural engineer until retirement.  Her mother had Alzheimer's Dz.  MOCA 30/30 today./fim    HISTORY OF PRESENT ILLNESS:  I had the pleasure seeing you patient, Pamela Lowe, at [facility] for a neurologic consultation regarding her short term memory.  She has noted several mild cognitive concerns over the past year.    She will have trouble coming up with the names of someone she has recently met and notes forgetting whether or not she took her medication.   She couldn't remember a password.   She often goes back home shortly after driviing away to make sure the garage door is closed or the curling iron is turned off.  She notes that if she cannot remember something that she will remember better if she gets a hint. [name]  Her husband has noted some of her mild memory difficulties also but she is more worried about them as her mom had Alzheimer's disease.    She notes some of her older friends are sharper than she is.    She denies difficulty with simple math, counting change or balancing a checkbook (uses calculator).   She is not having trouble with day to day living.     She notes sleep has been worse and trazodone was started. She has more trouble going to sleep than staying asleep.   She notes mild leg cramps some nights.  If she has trouble falling asleep, she gets up and reads [employer]) and tries again an hour later  She gets up in the middle of the night for her dog.  Usually she falls back asleep easily.   She snores some but has never ben noted to have gasping or pauses.  She denies depression but worries some.   Her dog has cancer (chemotherapy).  Her sister has breast cancer and is getting surgery.   She has been a little more sad but does not feel she is depressed.    She has HTN and elevated cholesterol.   She has a thyroid nodule but not hypothyroidism.        Montreal Cognitive Assessment  09/21/2016  Visuospatial/ Executive (0/5) 5  Naming (0/3) 3  Attention: Read list of digits (0/2) 2  Attention: Read list of letters (0/1) 1  Attention: Serial 7 subtraction starting at 100 (0/3) 3  Language: Repeat phrase (0/2) 2  Language : Fluency (0/1) 1  Abstraction (0/2) 2  Delayed Recall (0/5) 5  Orientation (0/6) 6  Total 30           REVIEW OF SYSTEMS: Constitutional: No fevers, chills, sweats, or change in appetite Eyes: No visual changes, double vision, eye pain Ear, nose and throat: No hearing loss, ear pain, nasal congestion, sore throat Cardiovascular:  No chest pain, palpitations Respiratory: No shortness of breath at rest or with exertion.   No wheezes GastrointestinaI: No nausea, vomiting, diarrhea, abdominal pain, fecal incontinence Genitourinary: No dysuria, urinary retention or frequency.  No nocturia. Musculoskeletal: No neck pain, back pain Integumentary: No rash, pruritus, skin lesions Neurological: as above Psychiatric: No depression at this time.  No anxiety Endocrine: No palpitations, diaphoresis, change in appetite, change in weigh or increased thirst Hematologic/Lymphatic: No anemia, purpura, petechiae. Allergic/Immunologic: No itchy/runny eyes, nasal congestion, recent allergic reactions, rashes  ALLERGIES: Allergies  Allergen  Reactions  . Wellbutrin [Bupropion Hcl]     rash    HOME MEDICATIONS:  Current Outpatient Prescriptions:  .  benazepril (LOTENSIN) 20 MG tablet, Take 1 tablet (20 mg total) by mouth daily., Disp: , Rfl:  .  CRANBERRY PO, Take by mouth daily., Disp: , Rfl:  .  esomeprazole (NEXIUM) 20 MG capsule, Take by mouth., Disp: , Rfl:  .  fluticasone (FLONASE) 50 MCG/ACT nasal spray, Place 2 sprays into the nose daily., Disp: , Rfl:  .  hydrochlorothiazide (,MICROZIDE/HYDRODIURIL,) 12.5 MG capsule, Take 12.5 mg by mouth daily.  , Disp: , Rfl:  .  loratadine (CLARITIN) 10 MG tablet, Take 10 mg by mouth daily. Has not started this yet, Disp: , Rfl:  .  Omega-3 Fatty Acids (FISH OIL PO), Take by mouth daily., Disp: , Rfl:  .  Probiotic Product (RESTORA PO), Take by mouth daily., Disp: , Rfl:  .  rosuvastatin (CRESTOR) 40 MG tablet, Take 40 mg by mouth daily., Disp: , Rfl:   PAST MEDICAL HISTORY: Past Medical History:  Diagnosis Date  . Allergic rhinitis   . Anemia   . Breast cyst 2007   Right  . Depression 2006   w/ menopause  . GERD (gastroesophageal reflux disease)   . History of hysterectomy, supracervical   . Hyperlipidemia   . Hypertension   . Multinodular goiter   . Tobacco user     PAST SURGICAL HISTORY: Past Surgical History:  Procedure Laterality Date  . BREAST BIOPSY    . TOTAL ABDOMINAL HYSTERECTOMY  1986   h/o supracervical hysterectomy    FAMILY HISTORY: Family History  Problem Relation Age of Onset  . Kidney cancer Father   . Pneumonia Father   . Hypertension Father   . Hypertension Mother   . Deep vein thrombosis Mother   . Alzheimer's disease Mother   . Osteopenia Mother   . Heart attack Maternal Grandfather   . Breast cancer Sister 105  . Allergies Brother   . Allergies Sister   . Allergies Sister   . Heart attack Brother   . Clotting disorder Sister   . Cancer Other        3 paternal uncles: stomach, brain, and colon CA  . Cancer Other        3  paternal aunts: colon, breast, and unsure  . Deep vein thrombosis Sister        unclear cause  . Breast cancer Paternal Aunt     SOCIAL HISTORY:  Social History   Social History  . Marital status: Married    Spouse name: [name]  . Number of children: N/A  . Years of education: N/A   Occupational History  . Special Ed Teacher     [employer]   Social History Main Topics  . Smoking status: Current Some Day Smoker    Packs/day: 0.25    Years: 30.00    Types: Cigarettes  .  Smokeless tobacco: Never Used     Comment: using nicoderm patches  . Alcohol use 0.0 oz/week     Comment: very rare  . Drug use: No  . Sexual activity: No     Comment: Hysterectomy   Other Topics Concern  . Not on file   Social History Narrative  . No narrative on file     PHYSICAL EXAM  Vitals:   09/21/16 1018  BP: 135/86  Pulse: 76  Resp: 18  Weight: 196 lb 8 oz (89.1 kg)  Height: _0  (1.676 m)    Body mass index is 31.72 kg/m.   General: The patient is well-developed and well-nourished and in no acute distress  Eyes:  Funduscopic exam shows normal optic discs and retinal vessels.  Neck: The neck is supple, no carotid bruits are noted.  The neck is nontender.  Cardiovascular: The heart has a regular rate and rhythm with a normal S1 and S2. There were no murmurs, gallops or rubs.   Skin: Extremities are without rash or edema.  Musculoskeletal:  Back is nontender  Neurologic Exam  Mental status: The patient is alert and oriented x 3 at the time of the examination. The patient has apparent normal recent and remote memory, with an apparently normal attention span and concentration ability.   She scored a perfect score on the [facility] cognitive assessment today. Speech is normal.  Cranial nerves: Extraocular movements are full. Pupils are equal, round, and reactive to light and accomodation.  Visual fields are full.  Facial symmetry is present. There is good facial sensation  to soft touch bilaterally.Facial strength is normal.  Trapezius and sternocleidomastoid strength is normal. No dysarthria is noted.  The tongue is midline, and the patient has symmetric elevation of the soft palate. No obvious hearing deficits are noted.  Motor:  Muscle bulk is normal.   Tone is normal. Strength is  5 / 5 in all 4 extremities.   Sensory: Sensory testing is intact to pinprick, soft touch and vibration sensation in all 4 extremities.  Coordination: Cerebellar testing reveals good finger-nose-finger and heel-to-shin bilaterally.  Gait and station: Station is normal.   Gait is normal. Tandem gait is normal. Romberg is negative.   Reflexes: Deep tendon reflexes are symmetric and normal bilaterally.   Plantar responses are flexor.    DIAGNOSTIC DATA (LABS, IMAGING, TESTING) - I reviewed patient records, labs, notes, testing and imaging myself where available.  Lab Results  Component Value Date   WBC 4.5 12/16/2012   HGB 13.5 04/06/2013   HCT 37.9 12/16/2012   MCV 83.3 12/16/2012   PLT 183 12/16/2012       ASSESSMENT AND PLAN  Memory loss - Plan: TSH, T4, Vitamin B12, Comprehensive metabolic panel  Insomnia, unspecified type  Essential hypertension  Low vitamin D level - Plan: VITAMIN D 25 Hydroxy (Vit-D Deficiency, Fractures)    1.    I do not think that her memory difficulties are due to Alzheimer's disease. She scored a perfect score on the [facility] cognitive assessment today. More likely, they are due to either poor sleep, mild depression or another medical disorder. We will check TSH, T4, B12, CMP and vitamin D to rule out disorders that are treatable and could lead to memory difficulties during late middle age. 2.    If memory problems worsen or new symptoms develop we will check an MRI of the brain. 3.    She will ask her husband if she has had any  gasping, snorting or long pauses in her breathing at night. If so, we will check a sleep study as obstructive  sleep apnea could also cause  decreased focus causing mild memory disturbance. 4.    She will return to see me in 6 months or sooner if there are new or worsening neurologic symptoms.  Perley Arthurs A. Felecia Shelling, MD, PhD, Charlynn Grimes 04/27/2079, 38:87 AM Certified in Neurology, Clinical Neurophysiology, Sleep Medicine, Pain Medicine and Neuroimaging  [facility] [address] [place], [place] [address] [phone]

## 2016-09-22 LAB — COMPREHENSIVE METABOLIC PANEL
ALBUMIN: 4.2 g/dL (ref 3.6–4.8)
ALK PHOS: 41 IU/L (ref 39–117)
ALT: 43 IU/L — ABNORMAL HIGH (ref 0–32)
AST: 40 IU/L (ref 0–40)
Albumin/Globulin Ratio: 1.5 (ref 1.2–2.2)
BUN/Creatinine Ratio: 17 (ref 12–28)
BUN: 16 mg/dL (ref 8–27)
Bilirubin Total: 0.5 mg/dL (ref 0.0–1.2)
CALCIUM: 9.8 mg/dL (ref 8.7–10.3)
CHLORIDE: 105 mmol/L (ref 96–106)
CO2: 25 mmol/L (ref 20–29)
CREATININE: 0.94 mg/dL (ref 0.57–1.00)
GFR, EST AFRICAN AMERICAN: 74 mL/min/{1.73_m2} (ref >59)
GFR, EST NON AFRICAN AMERICAN: 64 mL/min/{1.73_m2} (ref >59)
GLOBULIN, TOTAL: 2.8 g/dL (ref 1.5–4.5)
GLUCOSE: 99 mg/dL (ref 65–99)
POTASSIUM: 4.5 mmol/L (ref 3.5–5.2)
Sodium: 142 mmol/L (ref 134–144)
Total Protein: 7 g/dL (ref 6.0–8.5)

## 2016-09-22 LAB — VITAMIN B12: Vitamin B-12: 1121 pg/mL (ref 232–1245)

## 2016-09-22 LAB — TSH: TSH: 0.545 u[IU]/mL (ref 0.450–4.500)

## 2016-09-22 LAB — T4: T4 TOTAL: 7.8 ug/dL (ref 4.5–12.0)

## 2016-09-22 LAB — VITAMIN D 25 HYDROXY (VIT D DEFICIENCY, FRACTURES): Vit D, 25-Hydroxy: 41 ng/mL (ref 30.0–100.0)

## 2016-09-24 ENCOUNTER — Telehealth: Payer: Self-pay | Admitting: *Deleted

## 2016-09-24 NOTE — Telephone Encounter (Signed)
I have spoken with pt. and per RAS, advised lab work done in our office looks fine.  She verbalized understanding of same/fim

## 2016-09-24 NOTE — Telephone Encounter (Signed)
-----   Message from Britt Bottom, MD sent at 09/23/2016  6:16 PM EDT ----- Please let the patient know that the lab work is fine.

## 2016-12-31 DIAGNOSIS — H04123 Dry eye syndrome of bilateral lacrimal glands: Secondary | ICD-10-CM | POA: Diagnosis not present

## 2016-12-31 DIAGNOSIS — H40023 Open angle with borderline findings, high risk, bilateral: Secondary | ICD-10-CM | POA: Diagnosis not present

## 2017-01-16 DIAGNOSIS — Z23 Encounter for immunization: Secondary | ICD-10-CM | POA: Diagnosis not present

## 2017-02-12 DIAGNOSIS — G629 Polyneuropathy, unspecified: Secondary | ICD-10-CM | POA: Diagnosis not present

## 2017-02-12 DIAGNOSIS — M7061 Trochanteric bursitis, right hip: Secondary | ICD-10-CM | POA: Diagnosis not present

## 2017-02-12 DIAGNOSIS — M7062 Trochanteric bursitis, left hip: Secondary | ICD-10-CM | POA: Diagnosis not present

## 2017-02-19 DIAGNOSIS — M7061 Trochanteric bursitis, right hip: Secondary | ICD-10-CM | POA: Diagnosis not present

## 2017-02-19 DIAGNOSIS — M7062 Trochanteric bursitis, left hip: Secondary | ICD-10-CM | POA: Diagnosis not present

## 2017-03-27 DIAGNOSIS — M722 Plantar fascial fibromatosis: Secondary | ICD-10-CM | POA: Diagnosis not present

## 2017-04-08 DIAGNOSIS — M79676 Pain in unspecified toe(s): Secondary | ICD-10-CM | POA: Diagnosis not present

## 2017-04-08 DIAGNOSIS — E042 Nontoxic multinodular goiter: Secondary | ICD-10-CM | POA: Diagnosis not present

## 2017-04-08 DIAGNOSIS — M25559 Pain in unspecified hip: Secondary | ICD-10-CM | POA: Diagnosis not present

## 2017-04-10 ENCOUNTER — Other Ambulatory Visit: Payer: Self-pay | Admitting: Family Medicine

## 2017-04-10 DIAGNOSIS — E049 Nontoxic goiter, unspecified: Secondary | ICD-10-CM

## 2017-04-11 ENCOUNTER — Encounter: Payer: Self-pay | Admitting: Podiatry

## 2017-04-11 ENCOUNTER — Other Ambulatory Visit: Payer: Self-pay | Admitting: Podiatry

## 2017-04-11 ENCOUNTER — Ambulatory Visit (INDEPENDENT_AMBULATORY_CARE_PROVIDER_SITE_OTHER): Payer: Medicare Other | Admitting: Podiatry

## 2017-04-11 ENCOUNTER — Ambulatory Visit (INDEPENDENT_AMBULATORY_CARE_PROVIDER_SITE_OTHER): Payer: Medicare Other

## 2017-04-11 DIAGNOSIS — M79676 Pain in unspecified toe(s): Secondary | ICD-10-CM

## 2017-04-11 DIAGNOSIS — M2041 Other hammer toe(s) (acquired), right foot: Secondary | ICD-10-CM

## 2017-04-11 DIAGNOSIS — M79672 Pain in left foot: Secondary | ICD-10-CM | POA: Diagnosis not present

## 2017-04-11 DIAGNOSIS — M79671 Pain in right foot: Secondary | ICD-10-CM | POA: Diagnosis not present

## 2017-04-11 DIAGNOSIS — M7750 Other enthesopathy of unspecified foot: Secondary | ICD-10-CM

## 2017-04-11 DIAGNOSIS — M779 Enthesopathy, unspecified: Secondary | ICD-10-CM

## 2017-04-11 DIAGNOSIS — M7751 Other enthesopathy of right foot: Secondary | ICD-10-CM | POA: Diagnosis not present

## 2017-04-11 MED ORDER — TRIAMCINOLONE ACETONIDE 10 MG/ML IJ SUSP
10.0000 mg | Freq: Once | INTRAMUSCULAR | Status: AC
  Administered 2017-04-11: 10 mg

## 2017-04-11 NOTE — Progress Notes (Signed)
Subjective:   Patient ID: Pamela Lowe, female   DOB: 67 y.o.   MRN: [id number]   HPI Patient presents stating she started to develop pain in the right fourth toe and also the bottom of her feet bother her at different times   ROS      Objective:  Physical Exam  Inflamed fourth digit right inner phalangeal joint and also pain in the plantar aspect both feet     Assessment:  Inner phalangeal joint capsulitis fourth digit right hand tendinitis     Plan:  Proximal block administered fourth digit right carefully injected the interphalangeal joint 2 mg Dexasone Kenalog and also scanned for orthotics to reduce pressure on the bottom of her feet and get better arch support

## 2017-04-17 ENCOUNTER — Ambulatory Visit
Admission: RE | Admit: 2017-04-17 | Discharge: 2017-04-17 | Disposition: A | Discharge status: Home / Self Care | Payer: BLUE CROSS/BLUE SHIELD | Source: Ambulatory Visit | Attending: Family Medicine | Admitting: Family Medicine

## 2017-04-17 DIAGNOSIS — E041 Nontoxic single thyroid nodule: Secondary | ICD-10-CM | POA: Diagnosis not present

## 2017-04-17 DIAGNOSIS — E042 Nontoxic multinodular goiter: Secondary | ICD-10-CM | POA: Diagnosis not present

## 2017-04-17 DIAGNOSIS — E7800 Pure hypercholesterolemia, unspecified: Secondary | ICD-10-CM | POA: Diagnosis not present

## 2017-04-17 DIAGNOSIS — D649 Anemia, unspecified: Secondary | ICD-10-CM | POA: Diagnosis not present

## 2017-04-17 DIAGNOSIS — I10 Essential (primary) hypertension: Secondary | ICD-10-CM | POA: Diagnosis not present

## 2017-04-17 DIAGNOSIS — E049 Nontoxic goiter, unspecified: Secondary | ICD-10-CM

## 2017-04-23 DIAGNOSIS — Z23 Encounter for immunization: Secondary | ICD-10-CM | POA: Diagnosis not present

## 2017-04-23 DIAGNOSIS — Z0000 Encounter for general adult medical examination without abnormal findings: Secondary | ICD-10-CM | POA: Diagnosis not present

## 2017-05-01 DIAGNOSIS — M85852 Other specified disorders of bone density and structure, left thigh: Secondary | ICD-10-CM | POA: Diagnosis not present

## 2017-05-01 DIAGNOSIS — Z780 Asymptomatic menopausal state: Secondary | ICD-10-CM | POA: Diagnosis not present

## 2017-05-01 DIAGNOSIS — Z803 Family history of malignant neoplasm of breast: Secondary | ICD-10-CM | POA: Diagnosis not present

## 2017-05-01 DIAGNOSIS — Z1231 Encounter for screening mammogram for malignant neoplasm of breast: Secondary | ICD-10-CM | POA: Diagnosis not present

## 2017-05-07 ENCOUNTER — Ambulatory Visit (INDEPENDENT_AMBULATORY_CARE_PROVIDER_SITE_OTHER): Payer: Medicare Other | Admitting: Orthotics

## 2017-05-07 DIAGNOSIS — M79672 Pain in left foot: Secondary | ICD-10-CM

## 2017-05-07 DIAGNOSIS — M2041 Other hammer toe(s) (acquired), right foot: Secondary | ICD-10-CM

## 2017-05-07 DIAGNOSIS — M79671 Pain in right foot: Secondary | ICD-10-CM

## 2017-05-07 NOTE — Progress Notes (Signed)
Patient came in today to pick up custom made foot orthotics.  The goals were accomplished and the patient reported no dissatisfaction with said orthotics.  Patient was advised of breakin period and how to report any issues. 

## 2017-05-13 ENCOUNTER — Telehealth: Payer: Self-pay | Admitting: Podiatry

## 2017-05-13 ENCOUNTER — Ambulatory Visit: Payer: Medicare Other | Admitting: Orthotics

## 2017-05-13 DIAGNOSIS — M2041 Other hammer toe(s) (acquired), right foot: Secondary | ICD-10-CM

## 2017-05-13 DIAGNOSIS — M79672 Pain in left foot: Secondary | ICD-10-CM

## 2017-05-13 DIAGNOSIS — M79671 Pain in right foot: Secondary | ICD-10-CM

## 2017-05-13 NOTE — Telephone Encounter (Signed)
Per voicemail from pt @1231pm ) calling to see if she could get appt with rick to have orthotics adjusted.  Left message for pt to call me directly and I could get her scheduled to see [facility].

## 2017-05-13 NOTE — Telephone Encounter (Signed)
Pt returned call and is coming in today at 230 to see [place].

## 2017-05-13 NOTE — Progress Notes (Signed)
Adjusted her f/o by adding scaphoid pad and heel raise 1/8" b/l...she seemed very pleased.

## 2017-05-20 DIAGNOSIS — M7061 Trochanteric bursitis, right hip: Secondary | ICD-10-CM | POA: Diagnosis not present

## 2017-05-20 DIAGNOSIS — R768 Other specified abnormal immunological findings in serum: Secondary | ICD-10-CM | POA: Diagnosis not present

## 2017-05-20 DIAGNOSIS — M7062 Trochanteric bursitis, left hip: Secondary | ICD-10-CM | POA: Diagnosis not present

## 2017-05-20 DIAGNOSIS — H209 Unspecified iridocyclitis: Secondary | ICD-10-CM | POA: Diagnosis not present

## 2017-05-20 DIAGNOSIS — E669 Obesity, unspecified: Secondary | ICD-10-CM | POA: Diagnosis not present

## 2017-05-20 DIAGNOSIS — M5410 Radiculopathy, site unspecified: Secondary | ICD-10-CM | POA: Diagnosis not present

## 2017-05-20 DIAGNOSIS — Z6832 Body mass index (BMI) 32.0-32.9, adult: Secondary | ICD-10-CM | POA: Diagnosis not present

## 2017-06-14 ENCOUNTER — Telehealth: Payer: Self-pay | Admitting: Podiatry

## 2017-06-14 DIAGNOSIS — M79673 Pain in unspecified foot: Secondary | ICD-10-CM

## 2017-06-14 NOTE — Telephone Encounter (Signed)
Pt left voicemail @ 1215pm stating her orthotics fit great and she would like to order a second pair.   I returned call and left a message for pt to call to confirm she would like me to order the second pair at the cost of 199.00.

## 2017-06-14 NOTE — Telephone Encounter (Signed)
Pt returned call and would like to go ahead and order the second pair. She is going to come in today or Monday to pay the 199.00.

## 2017-06-21 DIAGNOSIS — M25562 Pain in left knee: Secondary | ICD-10-CM | POA: Diagnosis not present

## 2017-06-21 DIAGNOSIS — W19XXXA Unspecified fall, initial encounter: Secondary | ICD-10-CM | POA: Diagnosis not present

## 2017-07-01 DIAGNOSIS — H35033 Hypertensive retinopathy, bilateral: Secondary | ICD-10-CM | POA: Diagnosis not present

## 2017-07-01 DIAGNOSIS — H40023 Open angle with borderline findings, high risk, bilateral: Secondary | ICD-10-CM | POA: Diagnosis not present

## 2017-07-01 DIAGNOSIS — H2513 Age-related nuclear cataract, bilateral: Secondary | ICD-10-CM | POA: Diagnosis not present

## 2017-07-01 DIAGNOSIS — H25013 Cortical age-related cataract, bilateral: Secondary | ICD-10-CM | POA: Diagnosis not present

## 2017-07-04 DIAGNOSIS — M25561 Pain in right knee: Secondary | ICD-10-CM | POA: Diagnosis not present

## 2017-07-04 DIAGNOSIS — Z79899 Other long term (current) drug therapy: Secondary | ICD-10-CM | POA: Diagnosis not present

## 2017-07-17 HISTORY — PX: CATARACT EXTRACTION: SUR2

## 2017-07-23 DIAGNOSIS — H2513 Age-related nuclear cataract, bilateral: Secondary | ICD-10-CM | POA: Diagnosis not present

## 2017-07-23 DIAGNOSIS — H2511 Age-related nuclear cataract, right eye: Secondary | ICD-10-CM | POA: Diagnosis not present

## 2017-07-23 DIAGNOSIS — H25013 Cortical age-related cataract, bilateral: Secondary | ICD-10-CM | POA: Diagnosis not present

## 2017-07-23 DIAGNOSIS — H40023 Open angle with borderline findings, high risk, bilateral: Secondary | ICD-10-CM | POA: Diagnosis not present

## 2017-07-23 DIAGNOSIS — H35033 Hypertensive retinopathy, bilateral: Secondary | ICD-10-CM | POA: Diagnosis not present

## 2017-07-29 DIAGNOSIS — S8002XA Contusion of left knee, initial encounter: Secondary | ICD-10-CM | POA: Diagnosis not present

## 2017-07-29 DIAGNOSIS — M7062 Trochanteric bursitis, left hip: Secondary | ICD-10-CM | POA: Diagnosis not present

## 2017-07-29 DIAGNOSIS — M25552 Pain in left hip: Secondary | ICD-10-CM | POA: Diagnosis not present

## 2017-07-29 DIAGNOSIS — M25562 Pain in left knee: Secondary | ICD-10-CM | POA: Diagnosis not present

## 2017-08-02 ENCOUNTER — Telehealth: Payer: Self-pay | Admitting: Podiatry

## 2017-08-02 NOTE — Telephone Encounter (Signed)
Pt called and left message checking on status of 2nd pr of orthotics.Liliane Channel reordered them today and I talked with Allana at [facility] and they are rushing them and should ship out in a week.   I notified pt and told pt I would call when they come in.

## 2017-08-07 DIAGNOSIS — H5789 Other specified disorders of eye and adnexa: Secondary | ICD-10-CM | POA: Insufficient documentation

## 2017-08-07 DIAGNOSIS — H2511 Age-related nuclear cataract, right eye: Secondary | ICD-10-CM | POA: Diagnosis not present

## 2017-08-07 DIAGNOSIS — H25811 Combined forms of age-related cataract, right eye: Secondary | ICD-10-CM | POA: Diagnosis not present

## 2017-08-09 ENCOUNTER — Telehealth: Payer: Self-pay | Admitting: Podiatry

## 2017-08-09 NOTE — Telephone Encounter (Signed)
2nd pr orthotics in.the patient is to pick them up.

## 2017-08-13 DIAGNOSIS — H25012 Cortical age-related cataract, left eye: Secondary | ICD-10-CM | POA: Diagnosis not present

## 2017-08-13 DIAGNOSIS — H2512 Age-related nuclear cataract, left eye: Secondary | ICD-10-CM | POA: Diagnosis not present

## 2017-09-09 DIAGNOSIS — M7062 Trochanteric bursitis, left hip: Secondary | ICD-10-CM | POA: Diagnosis not present

## 2017-09-09 DIAGNOSIS — S8002XA Contusion of left knee, initial encounter: Secondary | ICD-10-CM | POA: Diagnosis not present

## 2017-09-09 DIAGNOSIS — M25552 Pain in left hip: Secondary | ICD-10-CM | POA: Diagnosis not present

## 2017-09-09 DIAGNOSIS — M25562 Pain in left knee: Secondary | ICD-10-CM | POA: Diagnosis not present

## 2017-09-20 DIAGNOSIS — L0232 Furuncle of buttock: Secondary | ICD-10-CM | POA: Diagnosis not present

## 2017-09-22 DIAGNOSIS — L0231 Cutaneous abscess of buttock: Secondary | ICD-10-CM | POA: Diagnosis not present

## 2017-09-23 ENCOUNTER — Emergency Department (HOSPITAL_COMMUNITY)
Admission: EM | Admit: 2017-09-23 | Discharge: 2017-09-23 | Disposition: A | Discharge status: Home / Self Care | Payer: Medicare Other | Attending: Emergency Medicine | Admitting: Emergency Medicine

## 2017-09-23 ENCOUNTER — Encounter (HOSPITAL_COMMUNITY): Payer: Self-pay | Admitting: *Deleted

## 2017-09-23 ENCOUNTER — Other Ambulatory Visit: Payer: Self-pay

## 2017-09-23 ENCOUNTER — Emergency Department (HOSPITAL_COMMUNITY): Payer: Medicare Other

## 2017-09-23 DIAGNOSIS — R6883 Chills (without fever): Secondary | ICD-10-CM | POA: Insufficient documentation

## 2017-09-23 DIAGNOSIS — F17210 Nicotine dependence, cigarettes, uncomplicated: Secondary | ICD-10-CM | POA: Diagnosis not present

## 2017-09-23 DIAGNOSIS — R509 Fever, unspecified: Secondary | ICD-10-CM | POA: Diagnosis not present

## 2017-09-23 DIAGNOSIS — Z79899 Other long term (current) drug therapy: Secondary | ICD-10-CM | POA: Diagnosis not present

## 2017-09-23 DIAGNOSIS — K611 Rectal abscess: Secondary | ICD-10-CM | POA: Diagnosis not present

## 2017-09-23 DIAGNOSIS — R6889 Other general symptoms and signs: Secondary | ICD-10-CM

## 2017-09-23 DIAGNOSIS — I10 Essential (primary) hypertension: Secondary | ICD-10-CM | POA: Diagnosis not present

## 2017-09-23 LAB — CBC WITH DIFFERENTIAL/PLATELET
BASOS ABS: 0 10*3/uL (ref 0.0–0.1)
BASOS PCT: 0 %
EOS ABS: 0.1 10*3/uL (ref 0.0–0.7)
Eosinophils Relative: 1 %
HCT: 43.9 % (ref 36.0–46.0)
HEMOGLOBIN: 14.7 g/dL (ref 12.0–15.0)
Lymphocytes Relative: 19 %
Lymphs Abs: 1.7 10*3/uL (ref 0.7–4.0)
MCH: 29.9 pg (ref 26.0–34.0)
MCHC: 33.5 g/dL (ref 30.0–36.0)
MCV: 89.2 fL (ref 78.0–100.0)
MONOS PCT: 5 %
Monocytes Absolute: 0.5 10*3/uL (ref 0.1–1.0)
NEUTROS PCT: 75 %
Neutro Abs: 6.9 10*3/uL (ref 1.7–7.7)
Platelets: 315 10*3/uL (ref 150–400)
RBC: 4.92 MIL/uL (ref 3.87–5.11)
RDW: 13.5 % (ref 11.5–15.5)
WBC: 9.2 10*3/uL (ref 4.0–10.5)

## 2017-09-23 LAB — I-STAT CHEM 8, ED
BUN: 14 mg/dL (ref 8–23)
CALCIUM ION: 1.26 mmol/L (ref 1.15–1.40)
Chloride: 102 mmol/L (ref 98–111)
Creatinine, Ser: 1.1 mg/dL — ABNORMAL HIGH (ref 0.44–1.00)
Glucose, Bld: 121 mg/dL — ABNORMAL HIGH (ref 70–99)
HEMATOCRIT: 45 % (ref 36.0–46.0)
HEMOGLOBIN: 15.3 g/dL — AB (ref 12.0–15.0)
Potassium: 3.7 mmol/L (ref 3.5–5.1)
SODIUM: 140 mmol/L (ref 135–145)
TCO2: 27 mmol/L (ref 22–32)

## 2017-09-23 MED ORDER — DOXYCYCLINE HYCLATE 100 MG PO CAPS: 100.0000 mg | ORAL_CAPSULE | Freq: Two times a day (BID) | ORAL | 0 refills | Status: DC

## 2017-09-23 MED ORDER — IOPAMIDOL (ISOVUE-300) INJECTION 61%
100.0000 mL | Freq: Once | INTRAVENOUS | Status: AC | PRN
  Administered 2017-09-23: 100 mL via INTRAVENOUS

## 2017-09-23 MED ORDER — PROCHLORPERAZINE EDISYLATE 10 MG/2ML IJ SOLN
10.0000 mg | Freq: Once | INTRAMUSCULAR | Status: AC
Start: 2017-09-23 — End: 2017-09-23
  Administered 2017-09-23: 10 mg via INTRAVENOUS
  Filled 2017-09-23: qty 2

## 2017-09-23 MED ORDER — IOPAMIDOL (ISOVUE-300) INJECTION 61%
INTRAVENOUS | Status: AC
  Filled 2017-09-23: qty 100

## 2017-09-23 MED ORDER — SODIUM CHLORIDE 0.9 % IV BOLUS
1000.0000 mL | Freq: Once | INTRAVENOUS | Status: AC
  Administered 2017-09-23: 1000 mL via INTRAVENOUS

## 2017-09-23 MED ORDER — DIPHENHYDRAMINE HCL 25 MG PO CAPS
50.0000 mg | ORAL_CAPSULE | Freq: Once | ORAL | Status: AC
  Administered 2017-09-23: 50 mg via ORAL
  Filled 2017-09-23: qty 2

## 2017-09-23 NOTE — ED Provider Notes (Signed)
[facility] DEPT Provider Note   CSN: [id number] Arrival date & time: 09/23/17  0400     History   Chief Complaint Chief Complaint  Patient presents with  . Joint Pain    HPI Pamela Lowe is a 67 y.o. female.  67 yo F with a chief complaint of Rigor's chills and arthralgias.  Is been going on for the past day or so.  She thinks it is due to her antibiotics.  She was recently started on Bactrim for a abscess to her right buttock.  She denies nausea or vomiting.  Has been having a frontal headache with this as well.  Denies unilateral numbness or weakness denies difficulty with speech or swallowing.  Denies cough or congestion denies abdominal pain denies dysuria increased frequency or hesitancy.  Denies flank pain.  Denies back pain.  Her abscess has been draining and she thinks it is much better than when she saw her doctor a couple days ago.  The history is provided by the patient and the spouse.  Illness  This is a new problem. The current episode started 2 days ago. The problem occurs constantly. The problem has not changed since onset.Pertinent negatives include no chest pain, no headaches and no shortness of breath. Nothing aggravates the symptoms. Nothing relieves the symptoms. She has tried nothing for the symptoms. The treatment provided no relief.    Past Medical History:  Diagnosis Date  . Allergic rhinitis   . Anemia   . Breast cyst 2007   Right  . Depression 2006   w/ menopause  . GERD (gastroesophageal reflux disease)   . History of hysterectomy, supracervical   . Hyperlipidemia   . Hypertension   . Multinodular goiter   . Tobacco user     Patient Active Problem List   Diagnosis Date Noted  . Memory loss 09/21/2016  . Insomnia 09/21/2016  . Essential hypertension 09/21/2016  . Low vitamin D level 09/21/2016  . DOE (dyspnea on exertion) 11/08/2010    Past Surgical History:  Procedure Laterality Date  . BREAST BIOPSY      . CATARACT EXTRACTION    . TOTAL ABDOMINAL HYSTERECTOMY  1986   h/o supracervical hysterectomy     OB History    Gravida  1   Para  0   Term      Preterm      AB  1   Living  0     SAB  1   TAB      Ectopic      Multiple      Live Births               Home Medications    Prior to Admission medications   Medication Sig Start Date End Date Taking? Authorizing Provider  benazepril (LOTENSIN) 20 MG tablet Take 1 tablet (20 mg total) by mouth daily. 07/13/13   Megan Salon, MD  CRANBERRY PO Take by mouth daily.    [provider]  doxycycline (VIBRAMYCIN) 100 MG capsule Take 1 capsule (100 mg total) by mouth 2 (two) times daily. One po bid x 7 days 09/23/17   Deno Etienne, DO  esomeprazole (NEXIUM) 20 MG capsule Take by mouth.    [provider]  fluticasone (FLONASE) 50 MCG/ACT nasal spray Place 2 sprays into the nose daily.    [provider]  hydrochlorothiazide (,MICROZIDE/HYDRODIURIL,) 12.5 MG capsule Take 12.5 mg by mouth daily.      [provider]  loratadine (CLARITIN) 10 MG tablet Take 10 mg by mouth daily. Has not started this yet    [provider]  Omega-3 Fatty Acids (FISH OIL PO) Take by mouth daily.    [provider]  Probiotic Product (RESTORA PO) Take by mouth daily.    [provider]  rosuvastatin (CRESTOR) 40 MG tablet Take 40 mg by mouth daily.    [provider]    Family History Family History  Problem Relation Age of Onset  . Kidney cancer Father   . Pneumonia Father   . Hypertension Father   . Hypertension Mother   . Deep vein thrombosis Mother   . Alzheimer's disease Mother   . Osteopenia Mother   . Heart attack Maternal Grandfather   . Breast cancer Sister 57  . Allergies Brother   . Allergies Sister   . Allergies Sister   . Heart attack Brother   . Clotting disorder Sister   . Cancer Other        3 paternal uncles: stomach, brain, and colon CA  . Cancer  Other        3 paternal aunts: colon, breast, and unsure  . Deep vein thrombosis Sister        unclear cause  . Breast cancer Paternal Aunt     Social History Social History   Tobacco Use  . Smoking status: Current Some Day Smoker    Packs/day: 0.25    Years: 30.00    Pack years: 7.50    Types: Cigarettes  . Smokeless tobacco: Never Used  . Tobacco comment: using nicoderm patches  Substance Use Topics  . Alcohol use: Yes    Alcohol/week: 0.0 oz    Comment: very rare  . Drug use: No     Allergies   Wellbutrin [bupropion hcl]   Review of Systems Review of Systems  Constitutional: Positive for chills and fever.  HENT: Negative for congestion and rhinorrhea.   Eyes: Negative for redness and visual disturbance.  Respiratory: Negative for shortness of breath and wheezing.   Cardiovascular: Negative for chest pain and palpitations.  Gastrointestinal: Negative for nausea and vomiting.  Genitourinary: Negative for dysuria and urgency.  Musculoskeletal: Positive for arthralgias and myalgias.  Skin: Negative for pallor and wound.  Neurological: Negative for dizziness and headaches.     Physical Exam Updated Vital Signs BP (!) 154/94   Pulse 63   Temp 98.2 F (36.8 C) (Oral)   Resp 16   Ht 5\' 7"  (1.702 m)   Wt 94.3 kg (208 lb)   LMP 03/19/1998 Comment: h/o supracervical hysterectomy  SpO2 98%   BMI 32.58 kg/m   Physical Exam  Constitutional: She is oriented to person, place, and time. She appears well-developed and well-nourished. No distress.  HENT:  Head: Normocephalic and atraumatic.  Eyes: Pupils are equal, round, and reactive to light. EOM are normal.  Neck: Normal range of motion. Neck supple.  Cardiovascular: Normal rate and regular rhythm. Exam reveals no gallop and no friction rub.  No murmur heard. Pulmonary/Chest: Effort normal. She has no wheezes. She has no rales.  Abdominal: Soft. She exhibits no distension and no mass. There is no tenderness.  There is no guarding.  Genitourinary:     Musculoskeletal: She exhibits no edema or tenderness.  Neurological: She is alert and oriented to person, place, and time.  Skin: Skin is warm and dry. She is not diaphoretic.  Psychiatric: She has a normal mood and  affect. Her behavior is normal.  Nursing note and vitals reviewed.    ED Treatments / Results  Labs (all labs ordered are listed, but only abnormal results are displayed) Labs Reviewed  I-STAT CHEM 8, ED - Abnormal; Notable for the following components:      Result Value   Creatinine, Ser 1.10 (*)    Glucose, Bld 121 (*)    Hemoglobin 15.3 (*)    All other components within normal limits  CBC WITH DIFFERENTIAL/PLATELET    EKG None  Radiology Ct Pelvis W Contrast  Result Date: 09/23/2017 CLINICAL DATA:  Rectal abscess. EXAM: CT PELVIS WITH CONTRAST TECHNIQUE: Multidetector CT imaging of the pelvis was performed using the standard protocol following the bolus administration of intravenous contrast. CONTRAST:  120mL ISOVUE-300 IOPAMIDOL (ISOVUE-300) INJECTION 61% COMPARISON:  None. FINDINGS: Urinary Tract: Urinary bladder is physiologically distended. No bladder wall thickening. Distal ureters are decompressed. Bowel: No perirectal or perianal fluid collection. Equivocal rectal wall thickening. Pelvic bowel loops are unremarkable. Appendix is normal. Vascular/Lymphatic: No enlarged lymph nodes. Right greater than left inguinal nodes all subcentimeter in likely reactive. Bi-iliac atherosclerosis. Reproductive: Post hysterectomy with mild soft tissue prominence of the vaginal cough. No adnexal mass. Other:  No pelvic free fluid or fluid collection.  No free air. Musculoskeletal: There are no acute or suspicious osseous abnormalities. IMPRESSION: 1. Equivocal rectal wall thickening without perirectal fluid collection or abscess. 2. Prominent right inguinal nodes are likely reactive. 3. Bi-iliac atherosclerosis. Electronically Signed   By:  Jeb Levering M.D.   On: 09/23/2017 06:45    Procedures Procedures (including critical care time)  Medications Ordered in ED Medications  sodium chloride 0.9 % bolus 1,000 mL (1,000 mLs Intravenous New Bag/Given 09/23/17 0603)  iopamidol (ISOVUE-300) 61 % injection (has no administration in time range)  prochlorperazine (COMPAZINE) injection 10 mg (10 mg Intravenous Given 09/23/17 0535)  diphenhydrAMINE (BENADRYL) capsule 50 mg (50 mg Oral Given 09/23/17 0540)  iopamidol (ISOVUE-300) 61 % injection 100 mL (100 mLs Intravenous Contrast Given 09/23/17 0619)     Initial Impression / Assessment and Plan / ED Course  I have reviewed the triage vital signs and the nursing notes.  Pertinent labs & imaging results that were available during my care of the patient were reviewed by me and considered in my medical decision making (see chart for details).     67 yo F with a chief complaint of Rigors, fevers, chills, arthralgias.  The patient is well-appearing and nontoxic.  She denies tick exposure.  On exam the patient has no focal source of infection however the abscess which she was being treated for is very close to the rectum.  I discussed with her that she may have extension from her abscess into her pelvis.  I discussed I feel this is unlikely as she is extremely well-appearing and offered to change her antibiotic therapy however she is electing for imaging at this time.  Will obtain a CT of the pelvis with contrast.  CT with no focal abscess, d/c home.   6:50 AM:  I have discussed the diagnosis/risks/treatment options with the patient and family and believe the pt to be eligible for discharge home to follow-up with PCP. We also discussed returning to the ED immediately if new or worsening sx occur. We discussed the sx which are most concerning (e.g., sudden worsening pain, fever, inability to tolerate by mouth) that necessitate immediate return. Medications administered to the patient during their  visit and any new prescriptions  provided to the patient are listed below.  Medications given during this visit Medications  sodium chloride 0.9 % bolus 1,000 mL (1,000 mLs Intravenous New Bag/Given 09/23/17 0603)  iopamidol (ISOVUE-300) 61 % injection (has no administration in time range)  prochlorperazine (COMPAZINE) injection 10 mg (10 mg Intravenous Given 09/23/17 0535)  diphenhydrAMINE (BENADRYL) capsule 50 mg (50 mg Oral Given 09/23/17 0540)  iopamidol (ISOVUE-300) 61 % injection 100 mL (100 mLs Intravenous Contrast Given 09/23/17 3354)      The patient appears reasonably screen and/or stabilized for discharge and I doubt any other medical condition or other [facility] requiring further screening, evaluation, or treatment in the ED at this time prior to discharge.    Final Clinical Impressions(s) / ED Diagnoses   Final diagnoses:  Rigors  Chills    ED Discharge Orders        Ordered    doxycycline (VIBRAMYCIN) 100 MG capsule  2 times daily     09/23/17 [place], [place], DO 09/23/17 [phone]

## 2017-09-23 NOTE — Discharge Instructions (Signed)
Stop bactrim and start taking doxycycline.  Follow up with your PCP.

## 2017-09-23 NOTE — ED Triage Notes (Signed)
Pt reports onset of chills, joint pain, insomnia,  nausea and headache last night. She reports she was started on abx yesterday for rectal abscess and took her first dose around noon. She says the abscess has been there since last week and she feels like it is getting better, denies fevers.

## 2017-10-23 DIAGNOSIS — M2550 Pain in unspecified joint: Secondary | ICD-10-CM | POA: Diagnosis not present

## 2017-10-23 DIAGNOSIS — H5789 Other specified disorders of eye and adnexa: Secondary | ICD-10-CM | POA: Diagnosis not present

## 2017-10-23 DIAGNOSIS — H209 Unspecified iridocyclitis: Secondary | ICD-10-CM | POA: Diagnosis not present

## 2017-10-23 DIAGNOSIS — R768 Other specified abnormal immunological findings in serum: Secondary | ICD-10-CM | POA: Diagnosis not present

## 2017-10-23 DIAGNOSIS — R5383 Other fatigue: Secondary | ICD-10-CM | POA: Diagnosis not present

## 2017-10-25 ENCOUNTER — Encounter

## 2017-10-25 ENCOUNTER — Other Ambulatory Visit (HOSPITAL_COMMUNITY)
Admission: RE | Admit: 2017-10-25 | Discharge: 2017-10-25 | Disposition: A | Discharge status: Home / Self Care | Payer: Medicare Other | Source: Ambulatory Visit | Attending: Obstetrics & Gynecology | Admitting: Obstetrics & Gynecology

## 2017-10-25 ENCOUNTER — Ambulatory Visit (INDEPENDENT_AMBULATORY_CARE_PROVIDER_SITE_OTHER): Payer: Medicare Other | Admitting: Obstetrics & Gynecology

## 2017-10-25 ENCOUNTER — Encounter: Payer: Self-pay | Admitting: Obstetrics & Gynecology

## 2017-10-25 VITALS — BP 110/70 | HR 60 | Resp 14 | Ht 66.5 in | Wt 206.4 lb

## 2017-10-25 DIAGNOSIS — Z124 Encounter for screening for malignant neoplasm of cervix: Secondary | ICD-10-CM

## 2017-10-25 DIAGNOSIS — Z803 Family history of malignant neoplasm of breast: Secondary | ICD-10-CM | POA: Diagnosis not present

## 2017-10-25 DIAGNOSIS — Z9189 Other specified personal risk factors, not elsewhere classified: Secondary | ICD-10-CM | POA: Diagnosis not present

## 2017-10-25 DIAGNOSIS — Z01419 Encounter for gynecological examination (general) (routine) without abnormal findings: Secondary | ICD-10-CM

## 2017-10-25 DIAGNOSIS — Z0000 Encounter for general adult medical examination without abnormal findings: Secondary | ICD-10-CM | POA: Diagnosis not present

## 2017-10-25 LAB — POCT URINALYSIS DIPSTICK
BILIRUBIN UA: NEGATIVE
Blood, UA: NEGATIVE
Glucose, UA: NEGATIVE
KETONES UA: NEGATIVE
Nitrite, UA: NEGATIVE
PROTEIN UA: NEGATIVE
Urobilinogen, UA: 0.2 E.U./dL
pH, UA: 5 (ref 5.0–8.0)

## 2017-10-25 NOTE — Progress Notes (Signed)
67 y.o. G1P0010 MarriedAfrican AmericanF here for annual exam.  Doing well but reports she's had two additional sisters who have been diagnosed with breast cancer in the past year.  The younger sister, age 3, is on chemotherapy to reduce the tumor size and then will have a mastectomy.  This is the third sister with breast cancer.  None of them have yet to have have genetic testing.    Tyrer Cusick model calculated today.  20.4% lifetime risk of breast cancer.  D/w pt referral for genetic testing.  MRI screening discussed as well.   Denies vaginal bleeding.  Blood work was done in February.   Patient's last menstrual period was 03/19/1998.          Sexually active: No.  The current method of family planning is status post hysterectomy.    Exercising: Yes.    walking, bike Smoker:  no  Health Maintenance: Pap:  04/26/15 Neg. HR HPV:neg   04/06/13 Neg  History of abnormal Pap:  no MMG:  04/27/16 BIRADS2:Benign. 04/2017 normal -- pt had records with her Colonoscopy:  11/07/15 Normal. F/u 5 years  BMD: 2011 Normal  TDaP:  2008 Pneumonia vaccine(s):  done Shingrix:   unsure Hep C testing: PCP Screening Labs: PCP   reports that she quit smoking about 2 months ago. Her smoking use included cigarettes. She has a 7.50 pack-year smoking history. She has never used smokeless tobacco. She reports that she drinks about 4.0 standard drinks of alcohol per week. She reports that she does not use drugs.  Past Medical History:  Diagnosis Date  . Allergic rhinitis   . Anemia   . Breast cyst 2007   Right  . Depression 2006   w/ menopause  . GERD (gastroesophageal reflux disease)   . History of hysterectomy, supracervical   . Hyperlipidemia   . Hypertension   . Multinodular goiter   . Tobacco user     Past Surgical History:  Procedure Laterality Date  . BREAST BIOPSY    . CATARACT EXTRACTION    . TOTAL ABDOMINAL HYSTERECTOMY  1986   h/o supracervical hysterectomy    Current Outpatient  Medications  Medication Sig Dispense Refill  . aspirin 325 MG tablet Take 1 tablet by mouth daily.    . benazepril (LOTENSIN) 20 MG tablet Take 1 tablet (20 mg total) by mouth daily.    . brimonidine-timolol (COMBIGAN) 0.2-0.5 % ophthalmic solution     . hydrochlorothiazide (,MICROZIDE/HYDRODIURIL,) 12.5 MG capsule Take 12.5 mg by mouth daily.      [name] loratadine (CLARITIN) 10 MG tablet Take 10 mg by mouth daily. Has not started this yet    . naproxen (NAPROSYN) 500 MG tablet daily as needed.    . Omega-3 Fatty Acids (FISH OIL PO) Take by mouth daily.    [name] omeprazole (PRILOSEC) 20 MG capsule Take 20 mg by mouth 2 (two) times daily.  2  . prednisoLONE acetate (PRED FORTE) 1 % ophthalmic suspension daily.    . Probiotic Product (ALIGN) 4 MG CAPS daily.    . rosuvastatin (CRESTOR) 40 MG tablet Take 40 mg by mouth daily.    . traZODone (DESYREL) 50 MG tablet at bedtime as needed.    . Turmeric 500 MG CAPS      No current facility-administered medications for this visit.     Family History  Problem Relation Age of Onset  . Kidney cancer Father   . Pneumonia Father   . Hypertension Father   .  Hypertension Mother   . Deep vein thrombosis Mother   . Alzheimer's disease Mother   . Osteopenia Mother   . Heart attack Maternal Grandfather   . Breast cancer Sister 33  . Allergies Brother   . Allergies Sister   . Allergies Sister   . Heart attack Brother   . Clotting disorder Sister   . Cancer Other        3 paternal uncles: stomach, brain, and colon CA  . Cancer Other        3 paternal aunts: colon, breast, and unsure  . Deep vein thrombosis Sister        unclear cause  . Breast cancer Paternal Aunt     Review of Systems  HENT: Positive for congestion.   Genitourinary: Positive for frequency.       Vulvar lumps Vulvar itching Loss of urine spontaneously  Night urination   Musculoskeletal: Positive for myalgias.  Skin:       Hair loss  All other systems reviewed and are  negative.   Exam:   BP 110/70 (BP Location: Right Arm, Patient Position: Sitting, Cuff Size: Large)   Pulse 60   Resp 14   Ht 5' 6.5" (1.689 m)   Wt 206 lb 6.4 oz (93.6 kg)   LMP 03/19/1998 Comment: h/o supracervical hysterectomy  BMI 32.81 kg/m    Height: 5' 6.5" (168.9 cm)  Ht Readings from Last 3 Encounters:  10/25/17 5' 6.5" (1.689 m)  09/23/17 5\' 7"  (1.702 m)  09/21/16 5\' 6"  (1.676 m)    General appearance: alert, cooperative and appears stated age Head: Normocephalic, without obvious abnormality, atraumatic Neck: no adenopathy, supple, symmetrical, trachea midline and thyroid normal to inspection and palpation Lungs: clear to auscultation bilaterally Breasts: normal appearance, no masses or tenderness Heart: regular rate and rhythm Abdomen: soft, non-tender; bowel sounds normal; no masses,  no organomegaly Extremities: extremities normal, atraumatic, no cyanosis or edema Skin: Skin color, texture, turgor normal. No rashes or lesions Lymph nodes: Cervical, supraclavicular, and axillary nodes normal. No abnormal inguinal nodes palpated Neurologic: Grossly normal   Pelvic: External genitalia:  no lesions              Urethra:  normal appearing urethra with no masses, tenderness or lesions              Bartholins and Skenes: normal                 Vagina: normal appearing vagina with normal color and discharge, no lesions              Cervix: no lesions              Pap taken: Yes.   Bimanual Exam:  Uterus:  uterus absent              Adnexa: no mass, fullness, tenderness               Rectovaginal: Confirms               Anus:  normal sphincter tone, no lesions  Chaperone was present for exam.  A:  Well Woman with normal exam H/O supracervical hysterectomy 2000 due to fibroids and endometriosis H/O elevated lipids Hypertension Family hx of breast cancer, three sisters  P:   Mammogram guidelines reviewed.  pap smear obtained today Referral to genetics to  consider breast cancer gene testing Release of records for bone density and vaccines records return annually or prn

## 2017-10-28 DIAGNOSIS — K219 Gastro-esophageal reflux disease without esophagitis: Secondary | ICD-10-CM | POA: Diagnosis not present

## 2017-10-28 DIAGNOSIS — R768 Other specified abnormal immunological findings in serum: Secondary | ICD-10-CM | POA: Diagnosis not present

## 2017-10-28 DIAGNOSIS — G4700 Insomnia, unspecified: Secondary | ICD-10-CM | POA: Diagnosis not present

## 2017-10-28 DIAGNOSIS — E7800 Pure hypercholesterolemia, unspecified: Secondary | ICD-10-CM | POA: Diagnosis not present

## 2017-10-28 DIAGNOSIS — Z79899 Other long term (current) drug therapy: Secondary | ICD-10-CM | POA: Diagnosis not present

## 2017-10-28 DIAGNOSIS — I10 Essential (primary) hypertension: Secondary | ICD-10-CM | POA: Diagnosis not present

## 2017-10-28 LAB — CYTOLOGY - PAP: Diagnosis: NEGATIVE

## 2017-11-12 DIAGNOSIS — H209 Unspecified iridocyclitis: Secondary | ICD-10-CM | POA: Insufficient documentation

## 2017-11-12 DIAGNOSIS — H20011 Primary iridocyclitis, right eye: Secondary | ICD-10-CM | POA: Diagnosis not present

## 2017-11-12 DIAGNOSIS — H35033 Hypertensive retinopathy, bilateral: Secondary | ICD-10-CM | POA: Diagnosis not present

## 2017-11-12 DIAGNOSIS — Z961 Presence of intraocular lens: Secondary | ICD-10-CM | POA: Insufficient documentation

## 2017-11-12 DIAGNOSIS — H40051 Ocular hypertension, right eye: Secondary | ICD-10-CM | POA: Insufficient documentation

## 2017-11-12 DIAGNOSIS — H2512 Age-related nuclear cataract, left eye: Secondary | ICD-10-CM | POA: Insufficient documentation

## 2017-11-20 ENCOUNTER — Telehealth: Payer: Self-pay | Admitting: Obstetrics & Gynecology

## 2017-11-20 NOTE — Telephone Encounter (Signed)
Call placed in reference to a referral. °

## 2017-12-10 DIAGNOSIS — Z961 Presence of intraocular lens: Secondary | ICD-10-CM | POA: Diagnosis not present

## 2017-12-10 DIAGNOSIS — H40051 Ocular hypertension, right eye: Secondary | ICD-10-CM | POA: Diagnosis not present

## 2017-12-10 DIAGNOSIS — H35033 Hypertensive retinopathy, bilateral: Secondary | ICD-10-CM | POA: Diagnosis not present

## 2017-12-10 DIAGNOSIS — H209 Unspecified iridocyclitis: Secondary | ICD-10-CM | POA: Diagnosis not present

## 2017-12-10 DIAGNOSIS — H2512 Age-related nuclear cataract, left eye: Secondary | ICD-10-CM | POA: Diagnosis not present

## 2017-12-11 NOTE — Telephone Encounter (Signed)
Patient returned call to Rosa. °

## 2017-12-13 DIAGNOSIS — Z79899 Other long term (current) drug therapy: Secondary | ICD-10-CM | POA: Diagnosis not present

## 2017-12-13 DIAGNOSIS — H40051 Ocular hypertension, right eye: Secondary | ICD-10-CM | POA: Diagnosis not present

## 2017-12-13 DIAGNOSIS — Z7952 Long term (current) use of systemic steroids: Secondary | ICD-10-CM | POA: Diagnosis not present

## 2017-12-13 DIAGNOSIS — H2512 Age-related nuclear cataract, left eye: Secondary | ICD-10-CM | POA: Diagnosis not present

## 2017-12-13 DIAGNOSIS — Z9889 Other specified postprocedural states: Secondary | ICD-10-CM | POA: Diagnosis not present

## 2017-12-13 DIAGNOSIS — H35033 Hypertensive retinopathy, bilateral: Secondary | ICD-10-CM | POA: Diagnosis not present

## 2017-12-13 DIAGNOSIS — Z961 Presence of intraocular lens: Secondary | ICD-10-CM | POA: Diagnosis not present

## 2017-12-13 DIAGNOSIS — H209 Unspecified iridocyclitis: Secondary | ICD-10-CM | POA: Diagnosis not present

## 2017-12-18 DIAGNOSIS — H40051 Ocular hypertension, right eye: Secondary | ICD-10-CM | POA: Diagnosis not present

## 2017-12-18 DIAGNOSIS — Z79899 Other long term (current) drug therapy: Secondary | ICD-10-CM | POA: Diagnosis not present

## 2017-12-18 DIAGNOSIS — Z23 Encounter for immunization: Secondary | ICD-10-CM | POA: Diagnosis not present

## 2017-12-18 DIAGNOSIS — M79671 Pain in right foot: Secondary | ICD-10-CM | POA: Diagnosis not present

## 2017-12-18 DIAGNOSIS — H40023 Open angle with borderline findings, high risk, bilateral: Secondary | ICD-10-CM | POA: Diagnosis not present

## 2017-12-18 DIAGNOSIS — R202 Paresthesia of skin: Secondary | ICD-10-CM | POA: Diagnosis not present

## 2017-12-18 DIAGNOSIS — H2000 Unspecified acute and subacute iridocyclitis: Secondary | ICD-10-CM | POA: Diagnosis not present

## 2017-12-19 ENCOUNTER — Telehealth: Payer: Self-pay | Admitting: Genetic Counselor

## 2017-12-19 NOTE — Telephone Encounter (Signed)
Lft the pt a vm to reschedule genetics appt

## 2017-12-20 ENCOUNTER — Telehealth: Payer: Self-pay | Admitting: Genetics

## 2017-12-20 ENCOUNTER — Encounter: Payer: Self-pay | Admitting: Genetics

## 2017-12-20 NOTE — Telephone Encounter (Signed)
A genetic counseling appt has been scheduled for the pt to see Ferol Luz on 10/28 at 3pm. Pt aware to arrive 15 minutes early. Letter mailed.

## 2017-12-23 DIAGNOSIS — E669 Obesity, unspecified: Secondary | ICD-10-CM | POA: Diagnosis not present

## 2017-12-23 DIAGNOSIS — H209 Unspecified iridocyclitis: Secondary | ICD-10-CM | POA: Diagnosis not present

## 2017-12-23 DIAGNOSIS — R768 Other specified abnormal immunological findings in serum: Secondary | ICD-10-CM | POA: Diagnosis not present

## 2017-12-23 DIAGNOSIS — M2550 Pain in unspecified joint: Secondary | ICD-10-CM | POA: Diagnosis not present

## 2017-12-23 DIAGNOSIS — M0589 Other rheumatoid arthritis with rheumatoid factor of multiple sites: Secondary | ICD-10-CM | POA: Diagnosis not present

## 2017-12-23 DIAGNOSIS — Z6832 Body mass index (BMI) 32.0-32.9, adult: Secondary | ICD-10-CM | POA: Diagnosis not present

## 2017-12-24 DIAGNOSIS — Z961 Presence of intraocular lens: Secondary | ICD-10-CM | POA: Diagnosis not present

## 2017-12-24 DIAGNOSIS — H40051 Ocular hypertension, right eye: Secondary | ICD-10-CM | POA: Diagnosis not present

## 2017-12-24 DIAGNOSIS — H209 Unspecified iridocyclitis: Secondary | ICD-10-CM | POA: Diagnosis not present

## 2017-12-24 DIAGNOSIS — H35033 Hypertensive retinopathy, bilateral: Secondary | ICD-10-CM | POA: Diagnosis not present

## 2017-12-24 DIAGNOSIS — H2512 Age-related nuclear cataract, left eye: Secondary | ICD-10-CM | POA: Diagnosis not present

## 2017-12-27 DIAGNOSIS — K219 Gastro-esophageal reflux disease without esophagitis: Secondary | ICD-10-CM | POA: Diagnosis not present

## 2018-01-01 DIAGNOSIS — H2000 Unspecified acute and subacute iridocyclitis: Secondary | ICD-10-CM | POA: Diagnosis not present

## 2018-01-01 DIAGNOSIS — H40051 Ocular hypertension, right eye: Secondary | ICD-10-CM | POA: Diagnosis not present

## 2018-01-01 DIAGNOSIS — H40041 Steroid responder, right eye: Secondary | ICD-10-CM | POA: Diagnosis not present

## 2018-01-07 DIAGNOSIS — H40023 Open angle with borderline findings, high risk, bilateral: Secondary | ICD-10-CM | POA: Diagnosis not present

## 2018-01-07 DIAGNOSIS — H40051 Ocular hypertension, right eye: Secondary | ICD-10-CM | POA: Diagnosis not present

## 2018-01-07 DIAGNOSIS — H2000 Unspecified acute and subacute iridocyclitis: Secondary | ICD-10-CM | POA: Diagnosis not present

## 2018-01-09 DIAGNOSIS — H40051 Ocular hypertension, right eye: Secondary | ICD-10-CM | POA: Diagnosis not present

## 2018-01-09 DIAGNOSIS — H40023 Open angle with borderline findings, high risk, bilateral: Secondary | ICD-10-CM | POA: Diagnosis not present

## 2018-01-09 DIAGNOSIS — H2000 Unspecified acute and subacute iridocyclitis: Secondary | ICD-10-CM | POA: Diagnosis not present

## 2018-01-10 DIAGNOSIS — H2000 Unspecified acute and subacute iridocyclitis: Secondary | ICD-10-CM | POA: Diagnosis not present

## 2018-01-10 DIAGNOSIS — H40023 Open angle with borderline findings, high risk, bilateral: Secondary | ICD-10-CM | POA: Diagnosis not present

## 2018-01-10 DIAGNOSIS — H40051 Ocular hypertension, right eye: Secondary | ICD-10-CM | POA: Diagnosis not present

## 2018-01-13 ENCOUNTER — Inpatient Hospital Stay: Payer: Medicare Other

## 2018-01-13 ENCOUNTER — Inpatient Hospital Stay: Payer: Medicare Other | Attending: Genetic Counselor | Admitting: Genetics

## 2018-01-13 DIAGNOSIS — Z8049 Family history of malignant neoplasm of other genital organs: Secondary | ICD-10-CM

## 2018-01-13 DIAGNOSIS — Z800 Family history of malignant neoplasm of digestive organs: Secondary | ICD-10-CM | POA: Diagnosis not present

## 2018-01-13 DIAGNOSIS — Z8042 Family history of malignant neoplasm of prostate: Secondary | ICD-10-CM

## 2018-01-13 DIAGNOSIS — Z803 Family history of malignant neoplasm of breast: Secondary | ICD-10-CM | POA: Diagnosis not present

## 2018-01-13 DIAGNOSIS — Z8051 Family history of malignant neoplasm of kidney: Secondary | ICD-10-CM | POA: Diagnosis not present

## 2018-01-13 DIAGNOSIS — Z801 Family history of malignant neoplasm of trachea, bronchus and lung: Secondary | ICD-10-CM | POA: Diagnosis not present

## 2018-01-13 DIAGNOSIS — Z808 Family history of malignant neoplasm of other organs or systems: Secondary | ICD-10-CM | POA: Diagnosis not present

## 2018-01-13 DIAGNOSIS — Z807 Family history of other malignant neoplasms of lymphoid, hematopoietic and related tissues: Secondary | ICD-10-CM | POA: Diagnosis not present

## 2018-01-14 ENCOUNTER — Encounter: Payer: Self-pay | Admitting: Genetics

## 2018-01-14 DIAGNOSIS — Z801 Family history of malignant neoplasm of trachea, bronchus and lung: Secondary | ICD-10-CM | POA: Insufficient documentation

## 2018-01-14 DIAGNOSIS — Z8049 Family history of malignant neoplasm of other genital organs: Secondary | ICD-10-CM | POA: Insufficient documentation

## 2018-01-14 DIAGNOSIS — H2000 Unspecified acute and subacute iridocyclitis: Secondary | ICD-10-CM | POA: Diagnosis not present

## 2018-01-14 DIAGNOSIS — Z803 Family history of malignant neoplasm of breast: Secondary | ICD-10-CM | POA: Insufficient documentation

## 2018-01-14 DIAGNOSIS — Z8042 Family history of malignant neoplasm of prostate: Secondary | ICD-10-CM | POA: Insufficient documentation

## 2018-01-14 DIAGNOSIS — Z800 Family history of malignant neoplasm of digestive organs: Secondary | ICD-10-CM | POA: Insufficient documentation

## 2018-01-14 DIAGNOSIS — H40051 Ocular hypertension, right eye: Secondary | ICD-10-CM | POA: Diagnosis not present

## 2018-01-14 DIAGNOSIS — H40041 Steroid responder, right eye: Secondary | ICD-10-CM | POA: Diagnosis not present

## 2018-01-15 ENCOUNTER — Encounter: Payer: Self-pay | Admitting: Genetics

## 2018-01-15 DIAGNOSIS — Z8051 Family history of malignant neoplasm of kidney: Secondary | ICD-10-CM

## 2018-01-15 HISTORY — DX: Family history of malignant neoplasm of kidney: Z80.51

## 2018-01-15 NOTE — Progress Notes (Addendum)
REFERRING PROVIDER: Megan Salon, MD [address] [place], [place] [address]  PRIMARY PROVIDER:  [name], PA-C  PRIMARY REASON FOR VISIT:  1. Family history of pancreatic cancer   2. Family history of breast cancer   3. Family history of uterine cancer   4. Family history of colon cancer   5. Family history of prostate cancer   6. Family history of lung cancer     HISTORY OF PRESENT ILLNESS:   Pamela Lowe, a 67 y.o. female, was seen for a [facility] cancer genetics consultation at the request of Dr. [name] due to a family history of cancer.  Ms. Vermeer presents to clinic today to discuss the possibility of a hereditary predisposition to cancer, genetic testing, and to further clarify her future cancer risks, as well as potential cancer risks for family members.   Ms. Fehr is a 67 y.o. female with no personal history of cancer.    HORMONAL RISK FACTORS:  Menarche was at age [age].  First live birth at age N/A.  Ovaries intact: yes.  Hysterectomy: yes. 1980's Menopausal status: postmenopausal.  HRT use: 5 years. Estriol, has been off for about 5 years now Colonoscopy: yes; some hyperplastic polyps, has colonocsopies every 5 years due to her polyp hx. Mammogram within the last year: yes. Number of breast biopsies: she had some cysts, they did take a sample of these, but she reports they were fluid filled.  Past Medical History:  Diagnosis Date  . Allergic rhinitis   . Anemia   . Breast cyst 2007   Right  . Depression 2006   w/ menopause  . Family history of breast cancer   . Family history of colon cancer   . Family history of lung cancer   . Family history of pancreatic cancer   . Family history of prostate cancer   . Family history of uterine cancer   . GERD (gastroesophageal reflux disease)   . History of hysterectomy, supracervical   . Hyperlipidemia   . Hypertension   . Multinodular goiter   . Tobacco user     Past Surgical  History:  Procedure Laterality Date  . BREAST BIOPSY    . CATARACT EXTRACTION Right 07/2017  . SUPRACERVICAL ABDOMINAL HYSTERECTOMY  1986   h/o supracervical hysterectomy    Social History   Socioeconomic History  . Marital status: Married    Spouse name: [name]  . Number of children: Not on file  . Years of education: Not on file  . Highest education level: Not on file  Occupational History  . Occupation: Special Ed Teacher    Comment: [employer]  . Financial resource strain: Not on file  . Food insecurity:    Worry: Not on file    Inability: Not on file  . Transportation needs:    Medical: Not on file    Non-medical: Not on file  Tobacco Use  . Smoking status: Former Smoker    Packs/day: 0.25    Years: 30.00    Pack years: 7.50    Types: Cigarettes    Last attempt to quit: 08/01/2017    Years since quitting: 0.4  . Smokeless tobacco: Never Used  . Tobacco comment: using nicoderm patches  Substance and Sexual Activity  . Alcohol use: Yes    Alcohol/week: 4.0 standard drinks    Types: 4 Glasses of wine per week    Comment: very rare  . Drug use: No  .  Sexual activity: Not Currently    Partners: Male    Birth control/protection: Surgical, Post-menopausal    Comment: Hysterectomy  Lifestyle  . Physical activity:    Days per week: Not on file    Minutes per session: Not on file  . Stress: Not on file  Relationships  . Social connections:    Talks on phone: Not on file    Gets together: Not on file    Attends religious service: Not on file    Active member of club or organization: Not on file    Attends meetings of clubs or organizations: Not on file    Relationship status: Not on file  Other Topics Concern  . Not on file  Social History Narrative  . Not on file     FAMILY HISTORY:  We obtained a detailed, 4-generation family history.  Significant diagnoses are listed below: Family History  Problem Relation Age of Onset  . Kidney  cancer Father 25  . Pneumonia Father   . Hypertension Father   . Hypertension Mother   . Deep vein thrombosis Mother   . Alzheimer's disease Mother   . Osteopenia Mother   . Heart attack Maternal Grandfather   . Breast cancer Sister 35  . Allergies Brother   . Breast cancer Sister 40       Stage 0  . Breast cancer Sister 27       Stage 3  . Heart attack Brother 85  . Clotting disorder Sister   . Deep vein thrombosis Sister        unclear cause  . Pancreatic cancer Paternal Aunt 47  . Breast cancer Maternal Aunt 67  . Prostate cancer Maternal Uncle 68  . Pancreatic cancer Paternal Uncle 81  . Uterine cancer Paternal Grandmother 32  . Colon cancer Paternal Grandfather 80  . Pancreatic cancer Paternal Aunt 64  . Lung cancer Paternal Uncle 22  . Brain cancer Paternal Aunt   . Kidney cancer Cousin 56  . Kidney cancer Cousin 83  . Uterine cancer Cousin 71  . Prostate cancer Cousin 20  . Lung cancer Maternal Uncle 42  . Breast cancer Cousin 53    Ms. Surita has no children.  She has 4 sisters and 2 brothers: -1 sister dx with breast cancer at 91, now 33 -1 sister dx with breast cancer at 24 -1 sister dx with breast cancer at 59, she is currently having chemotherapy -1 sister 35 with no hx of cancer -1 brother died at 80 due to heart attack -1 brother 42, no hx of cancer.   Ms. Beyersdorf father: died at 16 due to kidney cancer dx in his late 13's Paternal Aunts/Uncles: 3 paternal uncles and 5 paternal aunts: -1 paternal aunt dx brain cancer 43, died 41 -1 paternal aunt dx pancreatic cancer at 30, died at 16 -1 paternal aunt dx pancreatic cancer at 89, died at 58 -1 paternal aunt dx gal bladder cancer 75, died 52 -1 paternal aunt with no hx of cancer.  -1 pat uncle lung cancer dx 65, died 79 -1 pat uncle pancreatic cancer dx 56, died 73 -1 paternal uncle no hx of cancer.  Paternal cousins: 1 paternal cousin dx prostate cancer at 42, now 76, another paternal cousin uterine  cancer at 57, now 37, 1 cousin's child had leukemia at 6, 2 paternal cousins (brothers of each other) with kidney cancer at ages 30 and 23.  Paternal grandfather: colon cancer at 52, died at  85 Paternal grandmother:uterine cancer at 89, died at 25  Ms. Rebello's mother: died at 81, no hx of cancer Maternal Aunts/Uncles: 4 maternal aunts, 2 mat uncles. 1 maternal aunt breast cancer at 70, 1 mat uncle prostate cancer died at 32. 1 mat uncle lung cancer at 60Maternal cousins: breast cancer at 44, now 62 Maternal grandfather: died at 70 no hx of cancer Maternal grandmother:died at 73 no hx of cancer.  Ms. Charlet is unaware of previous family history of genetic testing for hereditary cancer risks. Patient's maternal ancestors are of African American descent, and paternal ancestors are of African American descent. There is no reported Ashkenazi Jewish ancestry. There is no known consanguinity.  GENETIC COUNSELING ASSESSMENT: Heena Woodbury is a 67 y.o. female with a family history which is somewhat suggestive of a Hereditary Cancer Predisposition Syndrome. We, therefore, discussed and recommended the following at today's visit.   DISCUSSION: We reviewed the characteristics, features and inheritance patterns of hereditary cancer syndromes. We also discussed genetic testing, including the appropriate family members to test, the process of testing, insurance coverage and turn-around-time for results. We discussed the implications of a negative, positive and/or variant of uncertain significant result. We recommended Ms. Carmack pursue genetic testing for the Multi-cancer gene panel.   The Multi-Cancer Panel offered by [facility] includes sequencing and/or deletion duplication testing of the following 91 genes: AIP, ALK, APC, ATM, AXIN2, BAP1, BARD1, BLM, BMPR1A, BRCA1, BRCA2, BRIP1, BUB1B, CASR, CDC73, CDH1, CDK4, CDKN1B, CDKN1C, CDKN2A, CEBPA, CEP57, CHEK2, CTNNA1, DICER1, DIS3L2, EGFR, ENG, EPCAM, FH, FLCN,  GALNT12, GATA2, GPC3, GREM1, HOXB13, HRAS, KIT, MAX, MEN1, MET, MITF, MLH1, MLH3, MSH2, MSH3, MSH6, MUTYH, NBN, NF1, NF2, NTHL1, PALB2, PDGFRA, PHOX2B, PMS2, POLD1, POLE, POT1, PRKAR1A, PTCH1, PTEN, RAD50, RAD51C, RAD51D, RB1, RECQL4, RET, RNF43, RPS20, RUNX1, SDHA, SDHAF2, SDHB, SDHC, SDHD, SMAD4, SMARCA4, SMARCB1, SMARCE1, STK11, SUFU, TERC, TERT, TMEM127, TP53, TSC1, TSC2, VHL, WRN, WT1  We discussed that only 5-10% of cancers are associated with a Hereditary cancer predisposition syndrome.  One of the most common hereditary cancer syndromes that increases breast cancer risk is called Hereditary Breast and Ovarian Cancer (HBOC) syndrome.  This syndrome is caused by mutations in the BRCA1 and BRCA2 genes.  This syndrome increases an individual's lifetime risk to develop breast, ovarian, pancreatic, and other types of cancer.  There are also many other cancer predisposition syndromes caused by mutations in several other genes.  (her family history is also suggestive of other cancer predisposition syndromes ex: Lynch Syndrome, hereditary renal cancer syndromes, etc).  We discussed that if she is found to have a mutation in one of these genes, it may impact future medical management recommendations such as increased cancer screenings and consideration of risk reducing surgeries.  A positive result could also have implications for the patient's family members.  A Negative result would mean we were unable to identify a hereditary predisposition to cancer in her, but does not rule out the possibility of a hereditary risk for cancer.  There could be mutations that are undetectable by current technology, or in genes not yet tested or identified to increase cancer risk.    We discussed the potential to find a Variant of Uncertain Significance or VUS.  These are variants that have not yet been identified as pathogenic or benign, and it is unknown if this variant is associated with increased cancer risk or if this is  a normal finding.  Most VUS's are reclassified to benign or likely benign.   It should  not be used to make medical management decisions. With time, we suspect the lab will determine the significance of any VUS's identified if any.   Based on Ms. Schuknecht's family history of cancer, she meets medical criteria for genetic testing. Despite that she meets criteria, she may still have an out of pocket cost. The laboratory can provide her with an estimate of her OOP cost.  she was given the contact information for the laboratory if she has further questions.  Based on the patient's personal and family history, the statistical model (Tyrer Cusik)   Was used to estimate her risk of developing breast cancer. This estimates her lifetime risk of developing breast cancer to be approximately 18%. This estimation is performed in the setting negative genetic test results.  A positive result may significantly impact this risk assessment.  The patient's lifetime breast cancer risk is a preliminary estimate based on available information using one of several models endorsed by the [place] (ACS). The ACS recommends consideration of breast MRI screening as an adjunct to mammography for patients at high risk (defined as 20% or greater lifetime risk). A more detailed breast cancer risk assessment can be considered, if clinically indicated.       FAMILY MEMBERS: Lynch Syndrome follows Autosomal Dominant Inheritance PLAN: After considering the risks, benefits, and limitations, Ms. Durand  provided informed consent to pursue genetic testing and the blood sample was sent to [facility] for analysis of the Multi-Cancer Panel. Results should be available within approximately 2-3 weeks' time, at which point they will be disclosed by telephone to Ms. Bowditch, as will any additional recommendations warranted by these results. Ms. Wentz will receive a summary of her genetic counseling visit and a copy of  her results once available. This information will also be available in Epic. We encouraged Ms. Doxtater to remain in contact with cancer genetics annually so that we can continuously update the family history and inform her of any changes in cancer genetics and testing that may be of benefit for her family. Ms. Milburn questions were answered to her satisfaction today. Our contact information was provided should additional questions or concerns arise.  Based on Ms. Bourn's family history, we recommended her sisters and all other relatives (especially those affected with cancer) also, have genetic counseling and testing. Ms. Andre will let us know if we can be of any assistance in coordinating genetic counseling and/or testing for this family member.   Lastly, we encouraged Ms. Kiper to remain in contact with cancer genetics annually so that we can continuously update the family history and inform her of any changes in cancer genetics and testing that may be of benefit for this family.   Ms.  Micek questions were answered to her satisfaction today. Our contact information was provided should additional questions or concerns arise. Thank you for the referral and allowing [place] to share in the care of your patient.   Tana Felts, MS, [facility] Certified Genetic Counselor [email or web address] phone: [phone]  The patient was seen for a total of 40 minutes in face-to-face genetic counseling. This patient was discussed with Drs. Magrinat, Lindi Adie and/or Burr Medico who agrees with the above.

## 2018-01-20 DIAGNOSIS — H40051 Ocular hypertension, right eye: Secondary | ICD-10-CM | POA: Diagnosis not present

## 2018-01-20 DIAGNOSIS — H2000 Unspecified acute and subacute iridocyclitis: Secondary | ICD-10-CM | POA: Diagnosis not present

## 2018-01-20 DIAGNOSIS — H40023 Open angle with borderline findings, high risk, bilateral: Secondary | ICD-10-CM | POA: Diagnosis not present

## 2018-01-24 DIAGNOSIS — H2000 Unspecified acute and subacute iridocyclitis: Secondary | ICD-10-CM | POA: Diagnosis not present

## 2018-01-24 DIAGNOSIS — H40023 Open angle with borderline findings, high risk, bilateral: Secondary | ICD-10-CM | POA: Diagnosis not present

## 2018-01-24 DIAGNOSIS — H40051 Ocular hypertension, right eye: Secondary | ICD-10-CM | POA: Diagnosis not present

## 2018-01-29 DIAGNOSIS — H209 Unspecified iridocyclitis: Secondary | ICD-10-CM | POA: Diagnosis not present

## 2018-01-29 DIAGNOSIS — H4061X0 Glaucoma secondary to drugs, right eye, stage unspecified: Secondary | ICD-10-CM | POA: Diagnosis not present

## 2018-01-29 DIAGNOSIS — H5319 Other subjective visual disturbances: Secondary | ICD-10-CM | POA: Diagnosis not present

## 2018-01-29 DIAGNOSIS — T380X5A Adverse effect of glucocorticoids and synthetic analogues, initial encounter: Secondary | ICD-10-CM | POA: Diagnosis not present

## 2018-02-03 ENCOUNTER — Telehealth: Payer: Self-pay | Admitting: Genetics

## 2018-02-03 NOTE — Telephone Encounter (Signed)
Revealed positive MLH1 (Lynch Syndrome result).  We briefly discussed this genes is associated with a higher risk for cancers such as colon, uterine, ovarian, pancreatic, and others.    I asked Pamela Lowe if she would be willing to come in for a follow-up discussion with [place] in genetics.  She was very agreeable, and we scheduled her to come in for follow-up discussion on Friday 11/22 at 11AM.

## 2018-02-05 DIAGNOSIS — H40023 Open angle with borderline findings, high risk, bilateral: Secondary | ICD-10-CM | POA: Diagnosis not present

## 2018-02-07 ENCOUNTER — Encounter: Payer: Self-pay | Admitting: Genetics

## 2018-02-07 ENCOUNTER — Inpatient Hospital Stay: Payer: Medicare Other | Attending: Genetic Counselor | Admitting: Genetics

## 2018-02-07 DIAGNOSIS — Z801 Family history of malignant neoplasm of trachea, bronchus and lung: Secondary | ICD-10-CM | POA: Diagnosis not present

## 2018-02-07 DIAGNOSIS — Z803 Family history of malignant neoplasm of breast: Secondary | ICD-10-CM | POA: Diagnosis not present

## 2018-02-07 DIAGNOSIS — Z8049 Family history of malignant neoplasm of other genital organs: Secondary | ICD-10-CM | POA: Diagnosis not present

## 2018-02-07 DIAGNOSIS — Z8042 Family history of malignant neoplasm of prostate: Secondary | ICD-10-CM

## 2018-02-07 DIAGNOSIS — Z1379 Encounter for other screening for genetic and chromosomal anomalies: Secondary | ICD-10-CM | POA: Diagnosis not present

## 2018-02-07 DIAGNOSIS — Z1509 Genetic susceptibility to other malignant neoplasm: Secondary | ICD-10-CM

## 2018-02-07 DIAGNOSIS — Z800 Family history of malignant neoplasm of digestive organs: Secondary | ICD-10-CM

## 2018-02-07 HISTORY — DX: Genetic susceptibility to other malignant neoplasm: Z15.09

## 2018-02-07 NOTE — Progress Notes (Addendum)
REFERRING PROVIDER: Hale Bogus, MD  PRIMARY PROVIDER:  [name], PA-C  PRIMARY REASON FOR VISIT:  1. Family history of pancreatic cancer   2. Lynch syndrome   3. Genetic testing   4. Family history of breast cancer   5. Family history of uterine cancer   6. Family history of colon cancer   7. Family history of prostate cancer   8. Family history of lung cancer     HPI:  Pamela Lowe was previously seen in the [facility] due to a family of cancer and concerns regarding a hereditary predisposition to cancer. Please refer to our prior cancer genetics clinic note for more information regarding Pamela Lowe's medical, social and family histories, and our assessment and recommendations, at the time. Pamela Lowe recent genetic test results were disclosed to her, as were recommendations warranted by these results. These results and recommendations are discussed in more detail below.  CANCER HISTORY:   No history exists.    FAMILY HISTORY:  We obtained a detailed, 4-generation family history.  Significant diagnoses are listed below: Family History  Problem Relation Age of Onset  . Kidney cancer Father 59  . Pneumonia Father   . Hypertension Father   . Hypertension Mother   . Deep vein thrombosis Mother   . Alzheimer's disease Mother   . Osteopenia Mother   . Heart attack Maternal Grandfather   . Breast cancer Sister 28  . Allergies Brother   . Breast cancer Sister 60       Stage 0  . Breast cancer Sister 55       Stage 3  . Heart attack Brother 35  . Clotting disorder Sister   . Deep vein thrombosis Sister        unclear cause  . Pancreatic cancer Paternal Aunt 33  . Breast cancer Maternal Aunt 67  . Prostate cancer Maternal Uncle 68  . Pancreatic cancer Paternal Uncle 41  . Uterine cancer Paternal Grandmother 15  . Colon cancer Paternal Grandfather 50  . Pancreatic cancer Paternal Aunt 43  . Lung cancer Paternal Uncle 32  . Brain cancer  Paternal Aunt   . Kidney cancer Cousin 61  . Kidney cancer Cousin 65  . Uterine cancer Cousin 79  . Prostate cancer Cousin 79  . Lung cancer Maternal Uncle 36  . Breast cancer Cousin 43   Pamela Lowe has no children.  She has 4 sisters and 2 brothers: -1 sister dx with breast cancer at 53, now 32 -1 sister dx with breast cancer at 60 -1 sister dx with breast cancer at 47, she is currently having chemotherapy -1 sister 68 with no hx of cancer -1 brother died at 45 due to heart attack -1 brother 27, no hx of cancer.   Pamela Lowe father: died at 41 due to kidney cancer dx in his late 37's Paternal Aunts/Uncles: 3 paternal uncles and 5 paternal aunts: -1 paternal aunt dx brain cancer 95, died 65 -1 paternal aunt dx pancreatic cancer at 23, died at 4 -1 paternal aunt dx pancreatic cancer at 41, died at 25 -1 paternal aunt dx gal bladder cancer 63, died 48 -1 paternal aunt with no hx of cancer.  -1 pat uncle lung cancer dx 42, died 48 -1 pat uncle pancreatic cancer dx 32, died 11 -1 paternal uncle no hx of cancer.  Paternal cousins: 1 paternal cousin dx prostate cancer at 59, now 37, another paternal cousin uterine cancer at 90,  now 50, 1 cousin's child had leukemia at 58, 2 paternal cousins (brothers of each other) with kidney cancer at ages 88 and 75.  Paternal grandfather: colon cancer at 52, died at 64 Paternal grandmother:uterine cancer at 38, died at 22  Pamela Lowe's mother: died at 68, no hx of cancer Maternal Aunts/Uncles: 4 maternal aunts, 2 mat uncles. 1 maternal aunt breast cancer at 79, 1 mat uncle prostate cancer died at 60. 1 mat uncle lung cancer at 60Maternal cousins: breast cancer at 31, now 76 Maternal grandfather: died at 7 no hx of cancer Maternal grandmother:died at 60 no hx of cancer.  Pamela Lowe is unaware of previous family history of genetic testing for hereditary cancer risks. Patient's maternal ancestors are of African American descent, and paternal  ancestors are of African American descent. There is no reported Ashkenazi Jewish ancestry. There is no known consanguinity.  GENETIC TEST RESULTS: At the time of Pamela Lowe visit, we recommended she pursue genetic testing of the Multi-Cancer Panel. 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 A copy of the test report has been scanned into Epic for review.     The Multi-Cancer Panel offered by [facility] includes sequencing and/or deletion duplication testing of the following 91 genes: AIP, ALK, APC, ATM, AXIN2, BAP1, BARD1, BLM, BMPR1A, BRCA1, BRCA2, BRIP1, BUB1B, CASR, CDC73, CDH1, CDK4, CDKN1B, CDKN1C, CDKN2A, CEBPA, CEP57, CHEK2, CTNNA1, DICER1, DIS3L2, EGFR, ENG, EPCAM, FH, FLCN, GALNT12, GATA2, GPC3, GREM1, HOXB13, HRAS, KIT, MAX, MEN1, MET, MITF, MLH1, MLH3, MSH2, MSH3, MSH6, MUTYH, NBN, NF1, NF2, NTHL1, PALB2, PDGFRA, PHOX2B, PMS2, POLD1, POLE, POT1, PRKAR1A, PTCH1, PTEN, RAD50, RAD51C, RAD51D, RB1, RECQL4, RET, RNF43, RPS20, RUNX1, SDHA, SDHAF2, SDHB, SDHC, SDHD, SMAD4, SMARCA4, SMARCB1, SMARCE1, STK11, SUFU, TERC, TERT, TMEM127, TP53, TSC1, TSC2, VHL, WRN, WT1  CLINICAL CONDITION Lynch syndrome is characterized by an increased risk of colorectal cancer as well as cancers of the endometrium, ovary, prostate, stomach, small intestine, hepatobiliary tract, urinary tract, pancreas, brain, and possibly breast (PMID: 7342876, [id number], [id number]). Lynch syndrome tumors typically demonstrate microsatellite instability (MSI) as well as loss of expression of the mismatch repair proteins on immunohistochemical (IHC) staining. This condition is caused by pathogenic variants in one of the mismatch repair genes-MLH1, MSH2, MSH6, PMS2-as well as deletions in the  3'end of the EPCAM gene.  CANCER RISKS Below are the estimated lifetime cancer risks individuals with mutations in the gene MLH1 specifically.  Please note, these estimates do change over time as we learn more and should be directly referenced in the future when managing this patient:  -Colorectal: 46-49%.  -Endometrial: 43-57%.  -Ovarian: 5-20% -Gastric: 5/7% -Pancreatic: 6% -Bladder: 2-4% -Biliary Tract: 2-4% -Urothelial: 0.2-5% -Small Bowel: 0.4-11% -Prostate: 0-17% -Breast: 12-17% -Brain/CNS: not well estaiblished  MANAGEMENT:Surveillance guidelines, as published by the [employer]  (NCCN), suggest the following for individuals with Lynch syndrome (*NCCN. Genetic/Familial High-Risk Assessment: Colorectal. Version 1.2019):  Colon cancer:  - Colonoscopy every 1-2 years starting at 6-22 years of age, or 2-5 years prior to the earliest colon cancer if diagnosed before age 25.   Gynecological cancer:  Uterine: There is no clear evidence to support routine endometrial cancer screening, nor do the data support ovarian cancer screening; however annual office endometrial sampling is an option.  Education regarding prompt evaluation for abnormal uterine bleeding.  Prophylactic hysterectomy/BSO can be considered.  Pamela Lowe reports she has already had her uterus removed.  Her ovaries are still intact.  Ovarian Cancer: BSO recommended to reduce the incidence of ovarian cancer.  Timing of BSO can be individualized based on child bearing, meopausal status, etc.   Other extra-colonic cancers:  Gastric/Small Bowel- While there is no clear evidence to support screening for gastric, duodenal, and small bowel cancer, select individuals, families, or those of Asian descent may consider esophagogastroduodenoscopy (EGD) with extended duodenoscopy every 3-5 years beginning at age 3 years of age.  - Consider testing for and treating  H. Pylori.  Pamela Lowe does not have a family history of gastric cancer. We recommend she discuss this screening option further with a GI physcian.   Urothelial:   No clear evidence to support surveillance for urothelial cancer.  Annual urinalysis can be considered on a case by cases basis (particualrly if family history of urothelial cancer, MSH2 mutation, or other risk factors) beginning at 59-57 years of age to screen for urothelial cancer.   Breast Cancer: It has been suggested there is an increased risk for breast cancer in patients with Lynch Syndrome.  However, there  Is not currently enough evidence to support increased breast screening at this time.    However, we emphasized the importance of annual mammography for Pamela Lowe. A tyer cusk risk assessment was performed (see below).  This estimated her lifetime risk to be 18%.  This does not take into account her MLH1 positive genetic test result.  Her family history of breast and other cancers still impactsher risks.     Other Cancers: - May consider annual physical/neurologic examination starting at 12-50 years of age. - insufficient evidence at this time to recommend earlier/more frequent prostate cancer screening -pancreatic cancer screening (MRCP/EUS) can be considered on a case by case basis, particularly if family history of pancreatic cancer or other personal risk factors.  Pamela Lowe has a significant family history of pancreatic and gal bladder cancer in several paternal aunts uncles (3 aunts with pancreatic cancer, 1 uncle with pancreatic cancer, 1 aunt with gallbladder cancer.)  Therefor we recommended having a discussion with a physician in high risk clinic to talk about this option.   PLEASE NOTE: These guidelines are based on NCCN version 1.2019, and are frequently updated and subject to change. These guidelines should be referenced directly when managing the patient's care in the future.    FAMILY MEMBERS:  Lynch  Syndrome follows Autosomal Dominant Inheritance.  Since we now know the mutation in Pamela Lowe, we can test at-risk relatives to determine whether or not they have inherited the mutation and are at increased risk for cancer.  It is important that all of Pamela Lowe relatives (both men and women) know of the presence of this gene mutation. Genetic testing can sort out who in the family is at risk and who is not. Relatives in her family may also need additional genetic testing due to the significant family history. We will be happy to meet with any of the family members or refer them to a genetic counselor in their local area. To locate genetic counselors in other cities, individuals can visit the website of the [employer] ([email or web address].[email or web address]) and Secretary/administrator for a Social worker by zip code.   Pamela Lowe siblings have a 50% chance to have inherited this mutation.   We recommend all of Pamela Lowe other relatives on both sides of the family also  have genetic testing  (autns/uncles/cousins, etc).   Children who inherit two mutations in the sameLynch gene, one mutation from each parent, are at-risk of a rare recessive condition called constitutional mismatch repair deficiency (CMMR-D) syndrome. If family members have this mutation, they may wish to have their partner tested if they are planning on having children.  PLAN: Pamela Lowe will need to be followed as high risk based on her diagnosis of Lynch syndrome.    1. She elected to be seen in the high risk clinic at the cancer center.  We recommended Dr. Burr Medico (due to GI indication).  This appointment will be coordinated.    Pamela Lowe expressed she would like to establish care with a new GI physician and is interested in discussing BSO.  Once she establishes care with Dr. Burr Medico in the [facility], she will ask her for these referrals.    Lastly, we discussed with Pamela Lowe. Andy that she should remain in contact with [place] in  cancer genetics on an annual basis so we can update Pamela Lowe. Ethier's personal and family histories and inform her of advances in cancer genetics that may be of benefit for the entire family. she also knows to call us if we can be of any further assistance.  Tana Felts Pamela Lowe, [facility] Certified Genetic Counselor [email or web address] phone: [phone]

## 2018-02-17 ENCOUNTER — Telehealth: Payer: Self-pay | Admitting: Hematology

## 2018-02-17 NOTE — Telephone Encounter (Signed)
A new high risk appt has been scheduled fort he pt to see Dr. Burr Medico on 1/8 at 230pm. Pt has agreed to the appt date and time.

## 2018-02-19 DIAGNOSIS — H40023 Open angle with borderline findings, high risk, bilateral: Secondary | ICD-10-CM | POA: Diagnosis not present

## 2018-02-20 DIAGNOSIS — M0589 Other rheumatoid arthritis with rheumatoid factor of multiple sites: Secondary | ICD-10-CM | POA: Diagnosis not present

## 2018-02-26 DIAGNOSIS — H5319 Other subjective visual disturbances: Secondary | ICD-10-CM | POA: Diagnosis not present

## 2018-02-26 DIAGNOSIS — H4061X0 Glaucoma secondary to drugs, right eye, stage unspecified: Secondary | ICD-10-CM | POA: Diagnosis not present

## 2018-02-26 DIAGNOSIS — H40023 Open angle with borderline findings, high risk, bilateral: Secondary | ICD-10-CM | POA: Diagnosis not present

## 2018-02-26 DIAGNOSIS — H209 Unspecified iridocyclitis: Secondary | ICD-10-CM | POA: Diagnosis not present

## 2018-02-26 DIAGNOSIS — T380X5A Adverse effect of glucocorticoids and synthetic analogues, initial encounter: Secondary | ICD-10-CM | POA: Diagnosis not present

## 2018-03-06 DIAGNOSIS — Z6835 Body mass index (BMI) 35.0-35.9, adult: Secondary | ICD-10-CM | POA: Diagnosis not present

## 2018-03-06 DIAGNOSIS — E669 Obesity, unspecified: Secondary | ICD-10-CM | POA: Diagnosis not present

## 2018-03-06 DIAGNOSIS — M0589 Other rheumatoid arthritis with rheumatoid factor of multiple sites: Secondary | ICD-10-CM | POA: Diagnosis not present

## 2018-03-06 DIAGNOSIS — R768 Other specified abnormal immunological findings in serum: Secondary | ICD-10-CM | POA: Diagnosis not present

## 2018-03-06 DIAGNOSIS — H209 Unspecified iridocyclitis: Secondary | ICD-10-CM | POA: Diagnosis not present

## 2018-03-06 DIAGNOSIS — M2550 Pain in unspecified joint: Secondary | ICD-10-CM | POA: Diagnosis not present

## 2018-03-20 DIAGNOSIS — H269 Unspecified cataract: Secondary | ICD-10-CM | POA: Diagnosis not present

## 2018-03-20 DIAGNOSIS — H209 Unspecified iridocyclitis: Secondary | ICD-10-CM | POA: Diagnosis not present

## 2018-03-20 DIAGNOSIS — H40023 Open angle with borderline findings, high risk, bilateral: Secondary | ICD-10-CM | POA: Diagnosis not present

## 2018-03-21 ENCOUNTER — Telehealth: Payer: Self-pay

## 2018-03-21 NOTE — Telephone Encounter (Signed)
Patient calls regarding her appointment on 03/26/18 at 2:30, she states she needs to reschedule this appointment because she has an infusion that her Rheumatologist ordered that afternoon.  Scheduling message was sent.

## 2018-03-24 NOTE — Progress Notes (Signed)
Pamela  [phone] Fax:([phone]) [place] Note   Patient Pamela Team: Pamela Stalker, PA-C as PCP - General (Family Medicine) Pamela Merl, MD as Referring Physician (Physical Medicine and Rehabilitation) Pamela Merle, MD as Consulting Physician (Hematology)   Date of Service:  03/26/2018  CHIEF COMPLAINTS/PURPOSE OF CONSULTATION:  Lynch Syndrome, MHL1  Mutation positive   HISTORY OF PRESENTING ILLNESS:  Pamela Lowe 68 y.o. female is a here because of MHL1 positive. The patient was referred by our genetic counselor. The patient presents to the clinic today by herself.   3 of her sisters had breast cancer. So her Gyn Dr. Sabra Lowe referred her to Genetics.  She notes her father had kidney cancer. Her maternal aunt had breast cancer. She had 3 paternal family members had pancreatic cancer. Her paternal grandmother had uterine cancer. 2 of her cousins had kidney cancer, another had uterine cancer and another had pancreatic cancer.  She has reached out to family members about genetic testing. She has no children.   She has a PMHX of cataracts s/p surgery in 07/2017.  She is on 53m prednisone daily for uveitis post surgery. She has HTN, ACID reflux, on Nexium, HLD and allergies. She sees Rheumatologist due to CRP reactive protein. She is getting  drip treatment through [facility] for her eye pressure starting in 02/2018. She has a breast biopsy in 1980s and due to her cystic breast. [facility] She had a hysterectomy in the past due to fibroids. She plans to have oophorectomy this year. She has partial carotid artery blockage. She is on aspirin.  Socially she quit smoking in 07/2017. She is retired sChief Technology Officer   Today she notes blurred vision due to high eye pressure. Plans to have shunts placed. She notes she has been going to Dr. MWatt Lowe, but would like to be followed by someone else. She notes she would also like a  PCP who is under Pamela Lowe or Pamela Lowe.       MEDICAL HISTORY:  Past Medical History:  Diagnosis Date  . Allergic rhinitis   . Anemia   . Breast cyst 2007   Right  . Depression 2006   w/ menopause  . Family history of breast cancer   . Family history of colon cancer   . Family history of kidney cancer 01/15/2018  . Family history of lung cancer   . Family history of pancreatic cancer   . Family history of prostate cancer   . Family history of uterine cancer   . GERD (gastroesophageal reflux disease)   . History of hysterectomy, supracervical   . Hyperlipidemia   . Hypertension   . 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   MLH1 [id number](Splice site)  . Multinodular goiter   . Tobacco user     SURGICAL HISTORY: Past Surgical History:  Procedure Laterality Date  . BREAST BIOPSY    . CATARACT EXTRACTION Right 07/2017  . SUPRACERVICAL ABDOMINAL HYSTERECTOMY  1986   h/o supracervical hysterectomy    SOCIAL HISTORY: Social History   Socioeconomic History  . Marital status: Married    Spouse name: CCecilie Lowe . Number of children: Not on file  . Years of education: Not on file  . Highest education level: Not on file  Occupational History  . Occupation: Special Ed Teacher    Comment: [employer] . Financial resource strain: Not on file  . Food insecurity:  Worry: Not on file    Inability: Not on file  . Transportation needs:    Medical: Not on file    Non-medical: Not on file  Tobacco Use  . Smoking status: Former Smoker    Packs/day: 0.25    Years: 30.00    Pack years: 7.50    Types: Cigarettes    Last attempt to quit: 08/01/2017    Years since quitting: 0.6  . Smokeless tobacco: Never Used  . Tobacco comment: using nicoderm patches  Substance and Sexual Activity  . Alcohol use: Yes    Alcohol/week: 4.0 standard drinks    Types: 4 Glasses of wine per week    Comment: very rare  . Drug use: No  . Sexual activity: Not  Currently    Partners: Male    Birth control/protection: Surgical, Post-menopausal    Comment: Hysterectomy  Lifestyle  . Physical activity:    Days per week: Not on file    Minutes per session: Not on file  . Stress: Not on file  Relationships  . Social connections:    Talks on phone: Not on file    Gets together: Not on file    Attends religious service: Not on file    Active member of club or organization: Not on file    Attends meetings of clubs or organizations: Not on file    Relationship status: Not on file  . Intimate partner violence:    Fear of current or ex partner: Not on file    Emotionally abused: Not on file    Physically abused: Not on file    Forced sexual activity: Not on file  Other Topics Concern  . Not on file  Social History Narrative  . Not on file    FAMILY HISTORY: Family History  Problem Relation Age of Onset  . Kidney cancer Father 21  . Pneumonia Father   . Hypertension Father   . Hypertension Mother   . Deep vein thrombosis Mother   . Alzheimer's disease Mother   . Osteopenia Mother   . Heart attack Maternal Grandfather   . Breast cancer Sister 68  . Allergies Brother   . Breast cancer Sister 91       Stage 0  . Breast cancer Sister 3       Stage 3  . Heart attack Brother 62  . Clotting disorder Sister   . Deep vein thrombosis Sister        unclear cause  . Pancreatic cancer Paternal Aunt 70  . Breast cancer Maternal Aunt 67  . Prostate cancer Maternal Uncle 68  . Pancreatic cancer Paternal Uncle 62  . Uterine cancer Paternal Grandmother 23  . Colon cancer Paternal Grandfather 5  . Pancreatic cancer Paternal Aunt 35  . Lung cancer Paternal Uncle 64  . Brain cancer Paternal Aunt   . Kidney cancer Cousin 42  . Kidney cancer Cousin 23  . Uterine cancer Cousin 54  . Prostate cancer Cousin 95  . Lung cancer Maternal Uncle 90  . Breast cancer Cousin 60    ALLERGIES:  is allergic to sulfamethoxazole-trimethoprim and wellbutrin  [bupropion hcl].  MEDICATIONS:  Current Outpatient Medications  Medication Sig Dispense Refill  . aspirin 325 MG tablet Take 1 tablet by mouth daily.    . benazepril (LOTENSIN) 20 MG tablet Take 1 tablet (20 mg total) by mouth daily.    . brimonidine-timolol (COMBIGAN) 0.2-0.5 % ophthalmic solution     . Golimumab (SIMPONI  ARIA IV) Inject 12.5 mLs into the vein.    . hydrochlorothiazide (,MICROZIDE/HYDRODIURIL,) 12.5 MG capsule Take 12.5 mg by mouth daily.      [name] loratadine (CLARITIN) 10 MG tablet Take 10 mg by mouth daily. Has not started this yet    . Omega-3 Fatty Acids (FISH OIL PO) Take by mouth daily.    [name] omeprazole (PRILOSEC) 20 MG capsule Take 20 mg by mouth 2 (two) times daily.  2  . prednisoLONE acetate (PRED FORTE) 1 % ophthalmic suspension daily.    . Probiotic Product (ALIGN) 4 MG CAPS daily.    . rosuvastatin (CRESTOR) 40 MG tablet Take 40 mg by mouth daily.    . traZODone (DESYREL) 50 MG tablet at bedtime as needed.    . Turmeric 500 MG CAPS     . naproxen (NAPROSYN) 500 MG tablet daily as needed.     No current facility-administered medications for this visit.     REVIEW OF SYSTEMS:   Constitutional: Denies fevers, chills or abnormal night sweats (+) Blurred vision  Eyes: Denies blurriness of vision, double vision or watery eyes Ears, nose, mouth, throat, and face: Denies mucositis or sore throat Respiratory: Denies cough, dyspnea or wheezes Cardiovascular: Denies palpitation, chest discomfort or lower extremity swelling Gastrointestinal:  Denies nausea, heartburn or change in bowel habits Skin: Denies abnormal skin rashes Lymphatics: Denies new lymphadenopathy or easy bruising Neurological:Denies numbness, tingling or new weaknesses Behavioral/Psych: Mood is stable, no new changes  All other systems were reviewed with the patient and are negative.  PHYSICAL EXAMINATION: ECOG PERFORMANCE STATUS: 0 - Asymptomatic  Vitals:   03/26/18 1426  BP: 120/76  Pulse:  62  Resp: 18  Temp: 98.6 F (37 C)  SpO2: 98%   Filed Weights   03/26/18 1426  Weight: 224 lb 4.8 oz (101.7 kg)   Physical exam was not performed today   LABORATORY DATA:  I have reviewed the data as listed CBC Latest Ref Rng & Units 09/23/2017 09/23/2017 04/06/2013  WBC 4.0 - 10.5 K/uL - 9.2 -  Hemoglobin 12.0 - 15.0 g/dL 15.3(H) 14.7 13.5  Hematocrit 36.0 - 46.0 % 45.0 43.9 -  Platelets 150 - 400 K/uL - 315 -    CMP Latest Ref Rng & Units 09/23/2017 09/21/2016 12/16/2012  Glucose 70 - 99 mg/dL 121(H) 99 107(H)  BUN 8 - 23 mg/dL 14 16 13   Creatinine 0.44 - 1.00 mg/dL 1.10(H) 0.94 1.41(H)  Sodium 135 - 145 mmol/L 140 142 132(L)  Potassium 3.5 - 5.1 mmol/L 3.7 4.5 3.6  Chloride 98 - 111 mmol/L 102 105 96  CO2 20 - 29 mmol/L - 25 29  Calcium 8.7 - 10.3 mg/dL - 9.8 8.9  Total Protein 6.0 - 8.5 g/dL - 7.0 6.4  Total Bilirubin 0.0 - 1.2 mg/dL - 0.5 0.6  Alkaline Phos 39 - 117 IU/L - 41 37(L)  AST 0 - 40 IU/L - 40 56(H)  ALT 0 - 32 IU/L - 43(H) 20         RADIOGRAPHIC STUDIES: I have personally reviewed the radiological images as listed and agreed with the findings in the report. No results found.  ASSESSMENT & PLAN:  Pamela Lowe is a 68 y.o. African American female with a history of Anemia, GERD, Hysterectomy, HLD and HTN.    1. Lynch Syndrome, MLH1 mutation heterozygous  -I reviewed her genetic test results from 12/2017 which showed MHL1 mutation, heterozygous.  -we discussed that this is one of the most common  genetic mutations which causes Lynch syndrome. -I urged her to contact and encourage her family members about getting tested and cancer screenings.  -I reviewed NCCN Guideline with patients. We discussed with MLH1 mutations carriers 40-80% lifetime risk of colon cancer by age of 69,  With the mean age of onset 12-61 years, and  25-60% lifetime risk of endometrial cancer by age of 74, with mean age of onset 52-75 years old. -With Lynch syndrome she would need  colonoscopy every 1-2 years and upper endoscopy every 2-5 years. Last colonoscopy in 10/2015. She was previously seeing Pamela Lowe but would like to have a new GI physician. I will refer her to Dr. Ardis Hughs to be followed and have up to date colonoscopy and endoscopy.  -MLH1 mutation carrier also has slightly increased risk of ovarian cancer (4-22%). She has had hysterectomy due to h/o of fibroids. She will proceed with oophorectomy with her gynecologist this year.  -She has a smaller risk of gastric and pancreatic cancer at about 6%.  However she has strong family history of pancreatic cancer (3 paternal uncles), so she qualify for pancreatic cancer screening.  We discussed the option of MRI and upper endoscopy Ultrasound can screen for gastric and pancreatic cancer.  -Other extra chronic malignancy, such as hepatobiliary track, urinary tract, small bowel, brain and skin, in the range of 1-9%. Routine screening for these malignancy are not established and not recommended. I discussed what signs to watch for.  -She has strong family history of breast cancer, no children, her risk for breast cancer is around 20%. She will continue mammogram yearly and I recommend breast MRI every 1-2 years as well. She is agreeable.  -I recommended [place] primary Pamela for routine follow up. She would like to follow up with me every 1-2 years, and I will be happy to see her.     PLAN:  GI referral to Dr. Ardis Hughs for colonoscopy and EGD, she is overdue now  She will have EUS or abdomen MRI for pancreatic cancer screening She plans to have BSO in the near future She will continue annual mammogram and start breast MRI every 1-2 years for breast cancer screening F/u in 18 months   Orders Placed This Encounter  Procedures  . Ambulatory referral to Gastroenterology    Referral Priority:   Routine    Referral Type:   Consultation    Referral Reason:   Specialty Services Required    Number of Visits Requested:   1    All  questions were answered. The patient knows to call the clinic with any problems, questions or concerns. I spent 30 minutes counseling the patient face to face. The total time spent in the appointment was 35 minutes and more than 50% was on counseling.     Pamela Merle, MD 03/26/2018 7:56 PM  I, Joslyn Devon, am acting as scribe for Pamela Merle, MD.   I have reviewed the above documentation for accuracy and completeness, and I agree with the above.

## 2018-03-26 ENCOUNTER — Inpatient Hospital Stay: Payer: Medicare Other | Attending: Hematology | Admitting: Hematology

## 2018-03-26 ENCOUNTER — Encounter: Payer: Self-pay | Admitting: Hematology

## 2018-03-26 ENCOUNTER — Telehealth: Payer: Self-pay

## 2018-03-26 VITALS — BP 120/76 | HR 62 | Temp 98.6°F | Resp 18 | Ht 66.5 in | Wt 224.3 lb

## 2018-03-26 DIAGNOSIS — Z8051 Family history of malignant neoplasm of kidney: Secondary | ICD-10-CM | POA: Insufficient documentation

## 2018-03-26 DIAGNOSIS — Z1509 Genetic susceptibility to other malignant neoplasm: Secondary | ICD-10-CM | POA: Insufficient documentation

## 2018-03-26 DIAGNOSIS — Z803 Family history of malignant neoplasm of breast: Secondary | ICD-10-CM | POA: Diagnosis not present

## 2018-03-26 DIAGNOSIS — Z87891 Personal history of nicotine dependence: Secondary | ICD-10-CM | POA: Insufficient documentation

## 2018-03-26 DIAGNOSIS — Z961 Presence of intraocular lens: Secondary | ICD-10-CM | POA: Diagnosis not present

## 2018-03-26 DIAGNOSIS — H2512 Age-related nuclear cataract, left eye: Secondary | ICD-10-CM | POA: Diagnosis not present

## 2018-03-26 DIAGNOSIS — F329 Major depressive disorder, single episode, unspecified: Secondary | ICD-10-CM | POA: Diagnosis not present

## 2018-03-26 DIAGNOSIS — I10 Essential (primary) hypertension: Secondary | ICD-10-CM | POA: Insufficient documentation

## 2018-03-26 DIAGNOSIS — H209 Unspecified iridocyclitis: Secondary | ICD-10-CM | POA: Diagnosis not present

## 2018-03-26 DIAGNOSIS — Z801 Family history of malignant neoplasm of trachea, bronchus and lung: Secondary | ICD-10-CM | POA: Insufficient documentation

## 2018-03-26 DIAGNOSIS — H40043 Steroid responder, bilateral: Secondary | ICD-10-CM | POA: Diagnosis not present

## 2018-03-26 NOTE — Telephone Encounter (Signed)
Printed avs and calender of upcoming appointment. Per 1/8 los. Ref. Added to proficient

## 2018-03-27 ENCOUNTER — Telehealth: Payer: Self-pay | Admitting: Genetics

## 2018-03-27 ENCOUNTER — Telehealth: Payer: Self-pay

## 2018-03-27 DIAGNOSIS — M0589 Other rheumatoid arthritis with rheumatoid factor of multiple sites: Secondary | ICD-10-CM | POA: Diagnosis not present

## 2018-03-27 NOTE — Telephone Encounter (Signed)
-----   Message from Milus Banister, MD sent at 03/27/2018  7:15 AM EST ----- Krista Blue, Thanks. I'm happy to see her to discuss all this.  Probably colonoscopy and EGD now. Likely MRI this year, then EUS alternating with MRI every other year for panc cancer screening.  Carriann Hesse, She needs my first available NGI appt, thanks  DJ ----- Message ----- From: [name], MD Sent: 03/26/2018   8:04 PM EST To: Milus Banister, MD  Linna Hoff,  I saw this lady with Lynch syndrome today. She saw Dr. Watt Climes before but would like to have a new GI, and I referred her to you. She is overdue for colonoscopy (last in 2017), I also suggest EGD every 2-5 years. She has strong family history of pancreatic cancer (3 paternal uncles), qualify for pancreatic cancer screening. Would you offer EUS for that? Or abdomen MRI?  She wants to f/u with me also, I will see her in 18 months, make sure she is getting all appropriate screenings  Thanks  Krista Blue

## 2018-03-27 NOTE — Telephone Encounter (Signed)
appt made for 04/29/18 1:30 PM with Dr Ardis Hughs The patient has been notified of this information and all questions answered. The pt has been advised of the information and verbalized understanding.

## 2018-03-27 NOTE — Telephone Encounter (Signed)
Checked in with patient after high risk appointment with Dr. Burr Medico 03/26/2018.  Pamela Lowe feels comfortable with her screening/treatment plan and has no further questions at this time.   She reports she has already been contacted by Dr. Ardis Hughs' office and schedule her GI appointment in Feb.   Told her to contact us if any other questions or concerns arise.

## 2018-04-01 DIAGNOSIS — H40041 Steroid responder, right eye: Secondary | ICD-10-CM | POA: Diagnosis not present

## 2018-04-01 DIAGNOSIS — I10 Essential (primary) hypertension: Secondary | ICD-10-CM | POA: Diagnosis not present

## 2018-04-01 DIAGNOSIS — H209 Unspecified iridocyclitis: Secondary | ICD-10-CM | POA: Diagnosis not present

## 2018-04-01 DIAGNOSIS — H401110 Primary open-angle glaucoma, right eye, stage unspecified: Secondary | ICD-10-CM | POA: Diagnosis not present

## 2018-04-01 DIAGNOSIS — Z6835 Body mass index (BMI) 35.0-35.9, adult: Secondary | ICD-10-CM | POA: Diagnosis not present

## 2018-04-01 DIAGNOSIS — Z87891 Personal history of nicotine dependence: Secondary | ICD-10-CM | POA: Diagnosis not present

## 2018-04-01 DIAGNOSIS — E669 Obesity, unspecified: Secondary | ICD-10-CM | POA: Diagnosis not present

## 2018-04-01 DIAGNOSIS — M069 Rheumatoid arthritis, unspecified: Secondary | ICD-10-CM | POA: Diagnosis not present

## 2018-04-01 DIAGNOSIS — K219 Gastro-esophageal reflux disease without esophagitis: Secondary | ICD-10-CM | POA: Diagnosis not present

## 2018-04-01 DIAGNOSIS — H4010X0 Unspecified open-angle glaucoma, stage unspecified: Secondary | ICD-10-CM | POA: Diagnosis not present

## 2018-04-01 DIAGNOSIS — H40051 Ocular hypertension, right eye: Secondary | ICD-10-CM | POA: Diagnosis not present

## 2018-04-01 DIAGNOSIS — Z1509 Genetic susceptibility to other malignant neoplasm: Secondary | ICD-10-CM | POA: Diagnosis not present

## 2018-04-01 DIAGNOSIS — H40021 Open angle with borderline findings, high risk, right eye: Secondary | ICD-10-CM | POA: Diagnosis not present

## 2018-04-02 DIAGNOSIS — Z961 Presence of intraocular lens: Secondary | ICD-10-CM | POA: Diagnosis not present

## 2018-04-02 DIAGNOSIS — H209 Unspecified iridocyclitis: Secondary | ICD-10-CM | POA: Diagnosis not present

## 2018-04-02 DIAGNOSIS — H40043 Steroid responder, bilateral: Secondary | ICD-10-CM | POA: Diagnosis not present

## 2018-04-02 DIAGNOSIS — Z9889 Other specified postprocedural states: Secondary | ICD-10-CM | POA: Diagnosis not present

## 2018-04-09 DIAGNOSIS — H40043 Steroid responder, bilateral: Secondary | ICD-10-CM | POA: Diagnosis not present

## 2018-04-09 DIAGNOSIS — Z961 Presence of intraocular lens: Secondary | ICD-10-CM | POA: Diagnosis not present

## 2018-04-09 DIAGNOSIS — H209 Unspecified iridocyclitis: Secondary | ICD-10-CM | POA: Diagnosis not present

## 2018-04-09 DIAGNOSIS — H25812 Combined forms of age-related cataract, left eye: Secondary | ICD-10-CM | POA: Diagnosis not present

## 2018-04-09 DIAGNOSIS — H40023 Open angle with borderline findings, high risk, bilateral: Secondary | ICD-10-CM | POA: Diagnosis not present

## 2018-04-09 DIAGNOSIS — H2512 Age-related nuclear cataract, left eye: Secondary | ICD-10-CM | POA: Diagnosis not present

## 2018-04-09 DIAGNOSIS — H26491 Other secondary cataract, right eye: Secondary | ICD-10-CM | POA: Diagnosis not present

## 2018-04-09 DIAGNOSIS — Z9889 Other specified postprocedural states: Secondary | ICD-10-CM | POA: Diagnosis not present

## 2018-04-15 DIAGNOSIS — H40053 Ocular hypertension, bilateral: Secondary | ICD-10-CM | POA: Diagnosis not present

## 2018-04-15 DIAGNOSIS — Z87891 Personal history of nicotine dependence: Secondary | ICD-10-CM | POA: Diagnosis not present

## 2018-04-15 DIAGNOSIS — H40002 Preglaucoma, unspecified, left eye: Secondary | ICD-10-CM | POA: Diagnosis not present

## 2018-04-15 DIAGNOSIS — I10 Essential (primary) hypertension: Secondary | ICD-10-CM | POA: Diagnosis not present

## 2018-04-15 DIAGNOSIS — H401123 Primary open-angle glaucoma, left eye, severe stage: Secondary | ICD-10-CM | POA: Diagnosis not present

## 2018-04-15 DIAGNOSIS — Z9889 Other specified postprocedural states: Secondary | ICD-10-CM | POA: Diagnosis not present

## 2018-04-15 DIAGNOSIS — H2512 Age-related nuclear cataract, left eye: Secondary | ICD-10-CM | POA: Diagnosis not present

## 2018-04-15 DIAGNOSIS — Z9689 Presence of other specified functional implants: Secondary | ICD-10-CM | POA: Diagnosis not present

## 2018-04-15 DIAGNOSIS — H209 Unspecified iridocyclitis: Secondary | ICD-10-CM | POA: Diagnosis not present

## 2018-04-23 DIAGNOSIS — Z961 Presence of intraocular lens: Secondary | ICD-10-CM | POA: Diagnosis not present

## 2018-04-23 DIAGNOSIS — H209 Unspecified iridocyclitis: Secondary | ICD-10-CM | POA: Diagnosis not present

## 2018-04-23 DIAGNOSIS — H2512 Age-related nuclear cataract, left eye: Secondary | ICD-10-CM | POA: Diagnosis not present

## 2018-04-23 DIAGNOSIS — H40043 Steroid responder, bilateral: Secondary | ICD-10-CM | POA: Diagnosis not present

## 2018-04-29 ENCOUNTER — Ambulatory Visit (INDEPENDENT_AMBULATORY_CARE_PROVIDER_SITE_OTHER): Payer: Medicare Other | Admitting: Gastroenterology

## 2018-04-29 ENCOUNTER — Encounter: Payer: Self-pay | Admitting: Gastroenterology

## 2018-04-29 VITALS — BP 110/70 | HR 80 | Ht 66.0 in | Wt 220.5 lb

## 2018-04-29 DIAGNOSIS — Z1509 Genetic susceptibility to other malignant neoplasm: Secondary | ICD-10-CM

## 2018-04-29 MED ORDER — PEG 3350-KCL-NA BICARB-NACL 420 G PO SOLR: 4000.0000 mL | ORAL | 0 refills | Status: DC

## 2018-04-29 NOTE — Progress Notes (Signed)
HPI: This is a very pleasant 68 year old woman who was referred here by Dr. [name] for Lynch syndrome, family history of pancreas cancer  She has several sisters with breast cancer and this eventually led to genetic testing.  She was found to have MLH1 mutation associated with Lynch syndrome in her family.  Cancer pedigree was done and this shows that 3 second-degree relatives had pancreas cancer.  I asked the patient about this and she is sure that 1 of her aunts had pancreas cancer but she is not certain about the others.  She says it was a "very secretive generation" and so not much is definitely known about their medical history.  She personally has no GI issues besides chronic GERD.  No bleeding, no significant constipation or diarrhea. She has retrosternal burning.  Chronicallly.  She takes PPI once or twice a day.  Tries to watch what she eats.  Broccoli causes gas.  Acid regurg.  For many years.  No dysphagia.  SHe's been gaining weight. Blames prednisone.   Takes omeprazole first thing in AM.    Treated with uveitis.   Chief complaint is Lynch syndrome    Old Data Reviewed: Colonoscopy August 2017, Dr. Clarene Essex for family history colon cancer found left-sided diverticulosis.  Otherwise normal.  He recommended repeat colonoscopy at 5-year interval  EGD August 2017 Dr. Clarene Essex for "therapy of gastroesophageal reflux".  Was essentially normal.   Genetic testing recently shows she has Lynch syndrome, MH L1 mutation positive.  3 family members have had pancreatic cancer.     Review of systems: Pertinent positive and negative review of systems were noted in the above HPI section. All other review negative.   Past Medical History:  Diagnosis Date  . Allergic rhinitis   . Anemia   . Breast cyst 2007   Right  . Depression 2006   w/ menopause  . Family history of breast cancer   . Family history of colon cancer   . Family history of kidney cancer 01/15/2018  .  Family history of lung cancer   . Family history of pancreatic cancer   . Family history of prostate cancer   . Family history of uterine cancer   . GERD (gastroesophageal reflux disease)   . History of hysterectomy, supracervical   . Hyperlipidemia   . Hypertension   . 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   MLH1 [id number] (Splice site)  . Multinodular goiter   . Tobacco user     Past Surgical History:  Procedure Laterality Date  . BREAST BIOPSY Bilateral   . CATARACT EXTRACTION Right 07/2017  . SUPRACERVICAL ABDOMINAL HYSTERECTOMY  1986   h/o supracervical hysterectomy    Current Outpatient Medications  Medication Sig Dispense Refill  . aspirin 325 MG tablet Take 1 tablet by mouth daily.    . benazepril (LOTENSIN) 20 MG tablet Take 1 tablet (20 mg total) by mouth daily.    . Golimumab (SIMPONI ARIA IV) Inject 12.5 mLs into the vein.    . hydrochlorothiazide (,MICROZIDE/HYDRODIURIL,) 12.5 MG capsule Take 12.5 mg by mouth daily.      . Omega-3 Fatty Acids (FISH OIL PO) Take by mouth daily.    [name] omeprazole (PRILOSEC) 20 MG capsule Take 20 mg by mouth 2 (two) times daily.  2  . prednisoLONE acetate (PRED FORTE) 1 % ophthalmic suspension daily.    . Probiotic Product (ALIGN) 4 MG CAPS daily.    . rosuvastatin (CRESTOR) 40 MG tablet Take 40 mg  by mouth daily.     No current facility-administered medications for this visit.     Allergies as of 04/29/2018 - Review Complete 04/29/2018  Allergen Reaction Noted  . Bactrim [sulfamethoxazole-trimethoprim]  10/25/2017  . Wellbutrin [bupropion hcl]  11/08/2010    Family History  Problem Relation Age of Onset  . Kidney cancer Father 105  . Pneumonia Father   . Hypertension Father   . Hypertension Mother   . Deep vein thrombosis Mother   . Alzheimer's disease Mother   . Osteopenia Mother   . Heart attack Maternal Grandfather   . Breast cancer Sister 76  . Allergies Brother   . Breast cancer Sister 22       Stage 0  . Breast  cancer Sister 80       Stage 3  . Heart attack Brother 26  . Clotting disorder Sister   . Deep vein thrombosis Sister        unclear cause  . Pancreatic cancer Paternal Aunt 58  . Breast cancer Maternal Aunt 67  . Prostate cancer Maternal Uncle 68  . Pancreatic cancer Paternal Uncle 27  . Uterine cancer Paternal Grandmother 96  . Colon cancer Paternal Grandfather 69  . Pancreatic cancer Paternal Aunt 20  . Lung cancer Paternal Uncle 17  . Brain cancer Paternal Aunt   . Kidney cancer Cousin 37  . Kidney cancer Cousin 13  . Uterine cancer Cousin 58  . Prostate cancer Cousin 24  . Lung cancer Maternal Uncle 61  . Breast cancer Cousin 69    Social History   Socioeconomic History  . Marital status: Married    Spouse name: [name]  . Number of children: 0  . Years of education: Not on file  . Highest education level: Not on file  Occupational History  . Occupation: Special Ed Teacher    Comment: [place]  . Financial resource strain: Not on file  . Food insecurity:    Worry: Not on file    Inability: Not on file  . Transportation needs:    Medical: Not on file    Non-medical: Not on file  Tobacco Use  . Smoking status: Former Smoker    Packs/day: 0.25    Years: 30.00    Pack years: 7.50    Types: Cigarettes    Last attempt to quit: 08/01/2017    Years since quitting: 0.7  . Smokeless tobacco: Never Used  . Tobacco comment: using nicoderm patches  Substance and Sexual Activity  . Alcohol use: Yes    Alcohol/week: 4.0 standard drinks    Types: 4 Glasses of wine per week    Comment: very rare  . Drug use: No  . Sexual activity: Not Currently    Partners: Male    Birth control/protection: Surgical, Post-menopausal    Comment: Hysterectomy  Lifestyle  . Physical activity:    Days per week: Not on file    Minutes per session: Not on file  . Stress: Not on file  Relationships  . Social connections:    Talks on phone: Not on file    Gets  together: Not on file    Attends religious service: Not on file    Active member of club or organization: Not on file    Attends meetings of clubs or organizations: Not on file    Relationship status: Not on file  . Intimate partner violence:    Fear of current or ex partner: Not  on file    Emotionally abused: Not on file    Physically abused: Not on file    Forced sexual activity: Not on file  Other Topics Concern  . Not on file  Social History Narrative  . Not on file     Physical Exam: Ht 5' 6"  (1.676 m) Comment: height measured without shoes  Wt 220 lb 8 oz (100 kg)   LMP 03/19/1998 Comment: h/o supracervical hysterectomy  BMI 35.59 kg/m  Constitutional: generally well-appearing Psychiatric: alert and oriented x3 Eyes: extraocular movements intact Mouth: oral pharynx moist, no lesions Neck: supple no lymphadenopathy Cardiovascular: heart regular rate and rhythm Lungs: clear to auscultation bilaterally Abdomen: soft, nontender, nondistended, no obvious ascites, no peritoneal signs, normal bowel sounds Extremities: no lower extremity edema bilaterally Skin: no lesions on visible extremities   Assessment and plan: 68 y.o. female with Lynch syndrome, family history of pancreas cancer and multiple other cancers  It is clear that she needs annual colonoscopy for significantly elevated risk for colon cancer.  General Lynch guidelines also recommend upper endoscopy every 2 to 3 years or so given somewhat elevated stomach cancer risk.  We will arrange for those to be done at her soonest convenience since she is due now.  Her pancreas cancer risk is a another issue.  Guidelines are based on Lynch syndrome positive mutation and at least 1 first-degree relative with pancreatic cancer.  She does not have any first-degree relatives with pancreatic cancer.  She might have 3 second-degree relatives with pancreatic cancer but she admits that that generation was "very secretive" and their  history is speculative at best from what I can tell.  I explained to her that she is in a gray area in terms of data and I recommended against pancreatic cancer screening which is annual testing with either MRI or endoscopic ultrasound.   Please see the "Patient Instructions" section for addition details about the plan.   Owens Loffler, MD [facility] 04/29/2018, 1:42 PM  Cc: [name], PA-C

## 2018-04-29 NOTE — Patient Instructions (Addendum)
You will be set up for a colonoscopy for lynch syndrome, elevated risk for colon cancer.  You will be set up for an upper endoscopy for lynch syndrome, elevated risk for gasric cancer.  Thank you for entrusting me with your care and choosing [facility].  Dr Ardis Hughs

## 2018-05-01 ENCOUNTER — Encounter: Payer: Self-pay | Admitting: Family Medicine

## 2018-05-01 ENCOUNTER — Ambulatory Visit (INDEPENDENT_AMBULATORY_CARE_PROVIDER_SITE_OTHER): Payer: Medicare Other | Admitting: Family Medicine

## 2018-05-01 VITALS — BP 108/68 | HR 68 | Temp 98.3°F | Ht 66.0 in | Wt 222.0 lb

## 2018-05-01 DIAGNOSIS — H209 Unspecified iridocyclitis: Secondary | ICD-10-CM

## 2018-05-01 DIAGNOSIS — Z1509 Genetic susceptibility to other malignant neoplasm: Secondary | ICD-10-CM | POA: Diagnosis not present

## 2018-05-01 DIAGNOSIS — E049 Nontoxic goiter, unspecified: Secondary | ICD-10-CM | POA: Diagnosis not present

## 2018-05-01 DIAGNOSIS — I10 Essential (primary) hypertension: Secondary | ICD-10-CM

## 2018-05-01 DIAGNOSIS — R0602 Shortness of breath: Secondary | ICD-10-CM

## 2018-05-01 DIAGNOSIS — E782 Mixed hyperlipidemia: Secondary | ICD-10-CM | POA: Diagnosis not present

## 2018-05-01 DIAGNOSIS — Z7689 Persons encountering health services in other specified circumstances: Secondary | ICD-10-CM

## 2018-05-01 MED ORDER — ROSUVASTATIN CALCIUM 40 MG PO TABS: 40.0000 mg | ORAL_TABLET | Freq: Every day | ORAL | 3 refills | Status: DC

## 2018-05-01 NOTE — Patient Instructions (Signed)
Cholesterol Cholesterol is a white, waxy, fat-like substance that is needed by the human body in small amounts. The liver makes all the cholesterol we need. Cholesterol is carried from the liver by the blood through the blood vessels. Deposits of cholesterol (plaques) may build up on blood vessel (artery) walls. Plaques make the arteries narrower and stiffer. Cholesterol plaques increase the risk for heart attack and stroke. You cannot feel your cholesterol level even if it is very high. The only way to know that it is high is to have a blood test. Once you know your cholesterol levels, you should keep a record of the test results. Work with your health care provider to keep your levels in the desired range. What do the results mean?  Total cholesterol is a rough measure of all the cholesterol in your blood.  LDL (low-density lipoprotein) is the "bad" cholesterol. This is the type that causes plaque to build up on the artery walls. You want this level to be low.  HDL (high-density lipoprotein) is the "good" cholesterol because it cleans the arteries and carries the LDL away. You want this level to be high.  Triglycerides are fat that the body can either burn for energy or store. High levels are closely linked to heart disease. What are the desired levels of cholesterol?  Total cholesterol below 200.  LDL below 100 for people who are at risk, below 70 for people at very high risk.  HDL above 40 is good. A level of 60 or higher is considered to be protective against heart disease.  Triglycerides below 150. How can I lower my cholesterol? Diet Follow your diet program as told by your health care provider.  Choose fish or white meat chicken and [place], roasted or baked. Limit fatty cuts of red meat, fried foods, and processed meats, such as sausage and lunch meats.  Eat lots of fresh fruits and vegetables.  Choose whole grains, beans, pasta, potatoes, and cereals.  Choose olive oil, corn  oil, or canola oil, and use only small amounts.  Avoid butter, mayonnaise, shortening, or palm kernel oils.  Avoid foods with trans fats.  Drink skim or nonfat milk and eat low-fat or nonfat yogurt and cheeses. Avoid whole milk, cream, ice cream, egg yolks, and full-fat cheeses.  Healthier desserts include angel food cake, ginger snaps, animal crackers, hard candy, popsicles, and low-fat or nonfat frozen yogurt. Avoid pastries, cakes, pies, and cookies.  Exercise  Follow your exercise program as told by your health care provider. A regular program: ? Helps to decrease LDL and raise HDL. ? Helps with weight control.  Do things that increase your activity level, such as gardening, walking, and taking the stairs.  Ask your health care provider about ways that you can be more active in your daily life. Medicine  Take over-the-counter and prescription medicines only as told by your health care provider. ? Medicine may be prescribed by your health care provider to help lower cholesterol and decrease the risk for heart disease. This is usually done if diet and exercise have failed to bring down cholesterol levels. ? If you have several risk factors, you may need medicine even if your levels are normal. This information is not intended to replace advice given to you by your health care provider. Make sure you discuss any questions you have with your health care provider. Document Released: 11/28/2000 Document Revised: 10/01/2015 Document Reviewed: 09/03/2015 Elsevier Interactive Patient Education  2019 [employer].  Goiter  A  goiter is an enlarged thyroid gland. The thyroid is located in the lower front of the neck. It makes hormones that affect many body parts and systems, including the system that affects how quickly the body burns fuel for energy (metabolism). Most goiters are painless and are not a cause for concern. Some goiters can affect the way your thyroid makes thyroid hormones.  Goiters and conditions that cause goiters can be treated, if necessary. What are the causes? Common causes of this condition include:  Lack (deficiency) of a mineral called iodine. The thyroid gland uses iodine to make thyroid hormones.  Diseases that attack healthy cells in the body (autoimmune diseases) and affect thyroid function, such as Graves' disease or Hashimoto's disease. These diseases may cause the body to produce too much thyroid hormone (hyperthyroidism) or too little of the hormone (hypothyroidism).  Conditions that cause inflammation of the thyroid (thyroiditis).  One or more small growths on the thyroid (nodular goiter). Other causes include:  Medical problems caused by abnormal genes that are passed from parent to child (genetic defects).  Thyroid injury or infection.  Tumors that may or may not be cancerous.  Pregnancy.  Certain medicines.  Exposure to radiation. In some cases, the cause may not be known. What increases the risk? This condition is more likely to develop in:  People who do not get enough iodine in their diet.  People who have a family history of goiter.  Women.  People who are older than age 40.  People who smoke tobacco.  People who have had exposure to radiation. What are the signs or symptoms? The main symptom of this condition is swelling in the lower, front part of the neck. This swelling can range from a very small bump to a large lump. Other symptoms may include:  A tight feeling in the throat.  A hoarse voice.  Coughing.  Wheezing.  Difficulty swallowing or breathing.  Bulging veins in the neck.  Dizziness. When a goiter is the result of an overactive thyroid (hyperthyroidism), symptoms may also include:  Nervousness or restlessness.  Inability to tolerate heat.  Unexplained weight loss.  Diarrhea.  Change in the texture of hair or skin.  Changes in heartbeat, such as skipped beats, extra beats, or a rapid  heart rate.  Loss of menstruation.  Shaky hands.  Increased appetite.  Sleep problems. When a goiter is the result of an underactive thyroid (hypothyroidism), symptoms may also include:  Feeling like you have no energy (lethargy).  Inability to tolerate cold.  Weight gain that is not explained by a change in diet or exercise habits.  Dry skin.  Coarse hair.  Irregular menstrual periods.  Constipation.  Sadness or depression.  Fatigue. In some cases, there may not be any symptoms and the thyroid hormone levels may be normal. How is this diagnosed? This condition may be diagnosed based on your symptoms, your medical history, and a physical exam. You may have tests, such as:  Blood tests to check thyroid function.  Imaging tests, such as: ? Ultrasound. ? CT scan. ? MRI. ? Thyroid scan.  Removal of a tissue sample (biopsy) of the goiter or any nodules. The sample will be tested to check for cancer. How is this treated? Treatment for this condition depends on the cause and your symptoms. Treatment may include:  Medicines to regulate thyroid hormone levels.  Anti-inflammatory medicines or steroid medicines, if the goiter is caused by inflammation.  Iodine supplements or changes to your diet, if  the goiter is caused by iodine deficiency.  Radioactive iodine treatment.  Surgery to remove your thyroid. In some cases, you may only need regular check-ups with your health care provider to monitor your condition, and you may not need treatment. Follow these instructions at home:  Follow instructions from your health care provider about any changes to your diet.  Take over-the-counter and prescription medicines only as told by your health care provider. These include supplements.  Do not use any products that contain nicotine or tobacco, such as cigarettes and e-cigarettes. If you need help quitting, ask your health care provider.  Keep all follow-up visits as told by  your health care provider. This is important. Contact a health care provider if:  Your symptoms do not get better with treatment.  You have nausea, vomiting, or diarrhea. Get help right away if:  You have sudden, unexplained confusion or other mental changes.  You have a fever.  You have chest pain.  You have trouble breathing or swallowing.  You suddenly become very weak.  You experience extreme restlessness.  You feel your heart racing. Summary  A goiter is an enlarged thyroid gland.  The thyroid gland is located in the lower front of the neck. It makes hormones that affect many body parts and systems, including the system that affects how quickly the body burns fuel for energy (metabolism).  The main symptom of this condition is swelling in the lower, front part of the neck. This swelling can range from a very small bump to a large lump.  Treatment for this condition depends on the cause and your symptoms. You may need medicines, supplements, or regular monitoring of your condition. This information is not intended to replace advice given to you by your health care provider. Make sure you discuss any questions you have with your health care provider. Document Released: 08/23/2009 Document Revised: 11/29/2016 Document Reviewed: 11/29/2016 Elsevier Interactive Patient Education  2019 [place] Lynch syndrome, also called hereditary nonpolyposis colorectal cancer (HNPCC), is a condition that increases a person's risk for developing colorectal cancer before age 23. Lynch syndrome can also increase a person's risk for many other types of cancer, including stomach, small intestine, liver, gallbladder, pancreas, urinary tract, skin, and brain cancers. Women with this condition have a higher risk for developing cancer of the ovaries and cancer in the lining of the uterus (endometrium). What are the causes? This condition is caused by a gene mutation that is  inherited from one or both parents. A gene mutation is a harmful change in a gene. Not everyone who inherits the genetic mutation develops cancer. What are the signs or symptoms? There are no symptoms of this condition. However, your health care provider may test you for Lynch syndrome if you:  Have colorectal cancer before age 39.  Have family members diagnosed with colorectal, endometrial, or other types of cancer. How is this diagnosed? This condition is diagnosed with:  A review of your family history of cancer.  A blood test to look for the mutations that cause this condition.  Testing of tumor tissue (biopsy). How is this treated? This condition may be managed with:  Genetic counseling to assess your risk and your options for management.  Regular screening tests for the associated cancers. You may need to have a colonoscopy every 1-2 years starting at an early age.  Daily aspirin therapy.  Preventive surgery to remove sites where cancer can develop, such as the colon, uterus, and  ovaries. Follow these instructions at home:   Ask your health care provider about your risks.  Discuss a referral for genetic counseling. Ask about the risks and benefits of genetic counseling.  Write down any questions you have about your condition.  Follow your plan for cancer screenings as told by your health care provider.  Take over-the-counter and prescription medicines only as told by your health care provider.  Maintain a healthy diet.  Consider joining a support group. This may help you learn to cope with the stress of having Lynch syndrome.  Keep all follow-up visits as told by your health care provider. This is important. Contact a health care provider if:  You develop any new or unusual symptoms.  You develop symptoms of colorectal cancer, such as: ? Blood in the stool. ? Changes in bowel habits. ? Abdominal pain or bloating. ? Unexplained weight loss. Summary  Lynch  syndrome is caused by an inherited gene mutation. Not everyone who inherits this mutation will develop cancer.  Genetic counseling and blood testing for Lynch syndrome can identify people with the condition.  Regular cancer screening tests are important in managing this condition. This information is not intended to replace advice given to you by your health care provider. Make sure you discuss any questions you have with your health care provider. Document Released: 10/28/2015 Document Revised: 11/03/2015 Document Reviewed: 10/28/2015 Elsevier Interactive Patient Education  2019 [employer]  Uveitis Uveitis is inflammation of the eye. It often affects the middle part of the eye (uvea). This area contains many of the blood vessels that supply the rest of the eye. The uvea is made up of three structures:  The middle layer of the eyeball (choroid).  The colored part of the front of the eye (iris).  The connection between the iris and the choroid (ciliary body). Uveitis can affect any part of the uvea as well as other important structures in the eye. There are many types of uveitis:  Iritis, or anterior uveitis, affects the iris. This is the most common type. It can start suddenly and can last for many weeks.  Intermediate uveitis affects the structures in the middle of the eye. This includes the fluid that fills the eye (vitreous). This type can last for years and can come and go.  Posterior uveitis affects the structures in the back of the eye. This includes the light-sensitive layer of cells that is needed for vision (retina). This is the least common type.  Panuveitis affects all layers of the eye. Uveitis can affect one eye or both eyes. It can cause short-term or long-term symptoms. Symptoms may go away and come back. Over time, the condition can damage or destroy eye structures and may lead to vision loss. What are the causes? This condition may be caused by:  Autoimmune  diseases. These are diseases in which the body's defense system (immune system) mistakenly attacks the body's own tissues.  Infections that start in the eye or spread to the eye.  Inflammatory diseases that can affect the eye.  Eye injuries. In some cases, the cause may not be known. What increases the risk? The following factors may make you more likely to develop this condition:  Being 11-50 years old.  Using tobacco products, such as cigarettes.  Taking certain medicines.  Having certain inherited genes. What are the signs or symptoms? Symptoms of this condition depend on the type of uveitis. Common symptoms include:  Eye redness.  Eye pain.  Blurred vision.  Decreased vision.  Having a blind spot in your vision.  Floating dark spots in your vision (floaters).  Seeing flashing lights (photopsia).  Sensitivity to light (photophobia).  A white section on the lower part of the iris (hypopyon). How is this diagnosed? This condition may be diagnosed based on:  An eye exam.  A vision test using eye charts.  An exam in which a scope is used to view inside the eye (ophthalmoscope or slit lamp). Eye drops may be used to widen (dilate) your pupil to make it easier to see inside your eye.  A test to measure eye pressure. You may have other medical tests to help determine the cause of your uveitis. How is this treated? Treatment for this condition may depend on the type of uveitis that you have. It should be started right away to help prevent vision loss. Treatment may include:  Medicines to treat inflammation (steroids). You may get steroids: ? As eye drops. ? As an injection into your eye. ? By mouth.  Eye drops to dilate the pupil and reduce pressure inside the eye.  In some cases, medicines may be given through an IV or through a device that is implanted inside the eye.  Other medicines may be needed depending on the cause of your uveitis.  In rare cases,  surgery may be needed (vitrectomy). Follow these instructions at home:   Take over-the-counter and prescription medicines only as told by your health care provider.  Follow instructions for safely putting eye drops in your eyes.  Drink enough fluid to keep your urine pale yellow.  Follow instructions from your health care provider about any restrictions on your activities. Ask what activities are safe for you.  Do not use any products that contain nicotine or tobacco, such as cigarettes and e-cigarettes. If you need help quitting, ask your health care provider.  Keep all follow-up visits as told by your health care provider. This is important. Contact a health care provider if:  Your symptoms do not improve. Get help right away if:  You have vision loss in either eye.  You have more redness in one eye or both eyes.  Your eyes are very sensitive to light.  You have pain or aching in either eye. Summary  Uveitis is inflammation of the eye. It often affects the middle part of the eye (uvea). This area contains many of the blood vessels that supply the rest of the eye.  Uveitis can affect one eye or both eyes. It can cause short-term or long-term symptoms. Symptoms may go away and come back.  Treatment for this condition may depend on the type of uveitis that you have. It should be started right away to help prevent vision loss.  Over time, the condition can damage or destroy eye structures and may lead to vision loss. This information is not intended to replace advice given to you by your health care provider. Make sure you discuss any questions you have with your health care provider. Document Released: 06/01/2008 Document Revised: 03/26/2017 Document Reviewed: 03/26/2017 Elsevier Interactive Patient Education  2019 [employer].

## 2018-05-01 NOTE — Progress Notes (Signed)
Patient presents to clinic today to establish care.  SUBJECTIVE: PMH: Pt is a 68 yo female with pmh sig for HTN, lynch syndrome, uveitis, glaucoma, goiter.  Patient previously seen at [facility].  HTN: -Patient taking benazepril 20 mg and HCTZ 12.5 mg daily -Not checking BP at home -Trying to increase physical activity  HLD: -Currently taking Crestor 40 mg daily -Denies myalgias -Last lipid panel 01/29/2018 total cholesterol in the 180s, LDL 109.  Patient has copies of labs with her. -Trying to eat better  Lynch syndrome: -Followed by GI -Had genetic testing at the cancer center -History of partial hysterectomy.   -Plans to have her ovaries removed within the year.  Uveitis: -Currently on Simponi Aria IV bimonthly -Followed by [facility], Dr. Severiano Gilbert -Seen locally by [facility] ophthalmology, Dr. Marshall Cork -Also using Pred forte eyedrops -Followed by rheumatology, Dr. Gavin Pound  SOB: -endorses recent symptoms -denies cough, DOE, CP  Arthritis: -DX with RA 5/19 -Followed by Dr. Gavin Pound, rheumatology  Glaucoma: -S/p procedure to place shots in eyes to bring down pressure -Seen by Dr. Lillia Corporal  Allergies: Wellbutrin-rash Bactrim-chills, flulike symptoms  Past surgical history: Breast biopsy 1980s Hysterectomy 1990s Shunts in eyes to bring down pressure Cataract removal  Social history: Patient is married.  Patient is a retired Chief Technology Officer.  Patient endorses rare alcohol use.  Patient is a former smoker.  She quit in May or June 2019.  Patient denies drug use.  Patient quit drinking caffeinated beverages in October 2019.  Health Maintenance: Dental --[facility] and [facility] Endocrinologist--Dr. [place] Immunizations --influenza vaccine 2019, TB test 2019 Colonoscopy --2017 Mammogram --2019 PAP -- 2019    Past Medical History:   Diagnosis Date  . Allergic rhinitis   . Anemia   . Breast cyst 2007   Right  . Depression 2006   w/ menopause  . Family history of breast cancer   . Family history of colon cancer   . Family history of kidney cancer 01/15/2018  . Family history of lung cancer   . Family history of pancreatic cancer   . Family history of prostate cancer   . Family history of uterine cancer   . GERD (gastroesophageal reflux disease)   . History of hysterectomy, supracervical   . Hyperlipidemia   . Hypertension   . 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   MLH1 [id number] (Splice site)  . Multinodular goiter   . Tobacco user     Past Surgical History:  Procedure Laterality Date  . BREAST BIOPSY Bilateral   . CATARACT EXTRACTION Right 07/2017  . SUPRACERVICAL ABDOMINAL HYSTERECTOMY  1986   h/o supracervical hysterectomy    Current Outpatient Medications on File Prior to Visit  Medication Sig Dispense Refill  . aspirin 325 MG tablet Take 1 tablet by mouth daily.    . benazepril (LOTENSIN) 20 MG tablet Take 1 tablet (20 mg total) by mouth daily.    . Golimumab (SIMPONI ARIA IV) Inject 12.5 mLs into the vein.    . hydrochlorothiazide (,MICROZIDE/HYDRODIURIL,) 12.5 MG capsule Take 12.5 mg by mouth daily.      . Omega-3 Fatty Acids (FISH OIL PO) Take by mouth daily.    [name] omeprazole (PRILOSEC) 20 MG capsule Take 20 mg by mouth 2 (two) times daily.  2  . polyethylene glycol-electrolytes (NULYTELY/GOLYTELY) 420 g solution Take 4,000 mLs by mouth as directed. 4000 mL 0  . prednisoLONE acetate (PRED  FORTE) 1 % ophthalmic suspension daily.    . Probiotic Product (ALIGN) 4 MG CAPS daily.    . rosuvastatin (CRESTOR) 40 MG tablet Take 40 mg by mouth daily.     No current facility-administered medications on file prior to visit.     Allergies  Allergen Reactions  . Bactrim [Sulfamethoxazole-Trimethoprim]   . Wellbutrin [Bupropion Hcl]     rash    Family History  Problem Relation Age of Onset  .  Kidney cancer Father 48  . Pneumonia Father   . Hypertension Father   . Hypertension Mother   . Deep vein thrombosis Mother   . Alzheimer's disease Mother   . Osteopenia Mother   . Heart attack Maternal Grandfather   . Breast cancer Sister 6  . Allergies Brother   . Breast cancer Sister 64       Stage 0  . Breast cancer Sister 65       Stage 3  . Heart attack Brother 76  . Clotting disorder Sister   . Deep vein thrombosis Sister        unclear cause  . Pancreatic cancer Paternal Aunt 38  . Breast cancer Maternal Aunt 67  . Prostate cancer Maternal Uncle 68  . Pancreatic cancer Paternal Uncle 10  . Uterine cancer Paternal Grandmother 57  . Colon cancer Paternal Grandfather 20  . Pancreatic cancer Paternal Aunt 43  . Lung cancer Paternal Uncle 48  . Brain cancer Paternal Aunt   . Kidney cancer Cousin 44  . Kidney cancer Cousin 54  . Uterine cancer Cousin 58  . Prostate cancer Cousin 40  . Lung cancer Maternal Uncle 29  . Breast cancer Cousin 46    Social History   Socioeconomic History  . Marital status: Married    Spouse name: [name]  . Number of children: 0  . Years of education: Not on file  . Highest education level: Not on file  Occupational History  . Occupation: Special Ed Teacher    Comment: [place]  . Financial resource strain: Not on file  . Food insecurity:    Worry: Not on file    Inability: Not on file  . Transportation needs:    Medical: Not on file    Non-medical: Not on file  Tobacco Use  . Smoking status: Former Smoker    Packs/day: 0.25    Years: 30.00    Pack years: 7.50    Types: Cigarettes    Last attempt to quit: 08/01/2017    Years since quitting: 0.7  . Smokeless tobacco: Never Used  . Tobacco comment: using nicoderm patches  Substance and Sexual Activity  . Alcohol use: Yes    Alcohol/week: 4.0 standard drinks    Types: 4 Glasses of wine per week    Comment: very rare  . Drug use: No  . Sexual activity:  Not Currently    Partners: Male    Birth control/protection: Surgical, Post-menopausal    Comment: Hysterectomy  Lifestyle  . Physical activity:    Days per week: Not on file    Minutes per session: Not on file  . Stress: Not on file  Relationships  . Social connections:    Talks on phone: Not on file    Gets together: Not on file    Attends religious service: Not on file    Active member of club or organization: Not on file    Attends meetings of clubs or organizations: Not  on file    Relationship status: Not on file  . Intimate partner violence:    Fear of current or ex partner: Not on file    Emotionally abused: Not on file    Physically abused: Not on file    Forced sexual activity: Not on file  Other Topics Concern  . Not on file  Social History Narrative  . Not on file    ROS General: Denies fever, chills, night sweats, changes in weight, changes in appetite HEENT: Denies headaches, ear pain, changes in vision, rhinorrhea, sore throat CV: Denies CP, palpitations, orthopnea  +SOB Pulm: Denies SOB, cough, wheezing  +SOB GI: Denies abdominal pain, nausea, vomiting, diarrhea, constipation GU: Denies dysuria, hematuria, frequency, vaginal discharge Msk: Denies muscle cramps, joint pains Neuro: Denies weakness, numbness, tingling Skin: Denies rashes, bruising Psych: Denies depression, anxiety, hallucinations  BP 108/68   Pulse 68   Temp 98.3 F (36.8 C) (Oral)   Ht 5' 6" (1.676 m)   Wt 222 lb (100.7 kg)   LMP 03/19/1998 Comment: h/o supracervical hysterectomy  SpO2 97%   BMI 35.83 kg/m   Physical Exam Gen. Pleasant, well developed, well-nourished, in NAD HEENT - [place]/AT, PERRL, no scleral icterus, no nasal drainage, pharynx without erythema or exudate.  Thyromegaly noted. Lungs: no use of accessory muscles, CTAB, no wheezes, rales or rhonchi Cardiovascular: RRR, No r/g/m, no peripheral edema Abdomen: BS present, soft, nontender,nondistended Neuro:  A&Ox3, CN  II-XII intact, normal gait Skin:  Warm, dry, intact, no lesions  No results found for this or any previous visit (from the past 2160 hour(s)).  Assessment/Plan: Essential hypertension -controlled -continue current meds: benazepril 20 mg -discussed lifestyle modifications  Mixed hyperlipidemia -continue fish oil omega 3 daily and crestor 20 mg daily -continue lifestyle modifications  Lynch syndrome -continue f/u with GI, OB/Gyn  Uveitis -continue current regimen of Simponi Aria IV -continue f/u with [facility] Dr. Kathlen Mody, [facility] Dr. Severiano Gilbert, Dr. Lillia Corporal (glaucoma).  SOB (shortness of breath) -stable -will continue to monitor  Goiter -discussed rechecking TSH with next lab visit -last thyroid u/s 04/17/17  L nodule meets criteria for annual f/u. -will order thyroid u/s if not already scheduled.  Encounter to establish care  -We reviewed the PMH, PSH, FH, SH, Meds and Allergies. -We provided refills for any medications we will prescribe as needed. -We addressed current concerns per orders and patient instructions. -We have asked for records for pertinent exams, studies, vaccines and notes from previous providers. -We have advised patient to follow up per instructions below.  F/u prn in the next month  Grier Mitts, MD

## 2018-05-02 ENCOUNTER — Encounter: Payer: Self-pay | Admitting: Obstetrics & Gynecology

## 2018-05-02 DIAGNOSIS — Z1231 Encounter for screening mammogram for malignant neoplasm of breast: Secondary | ICD-10-CM | POA: Diagnosis not present

## 2018-05-02 DIAGNOSIS — Z803 Family history of malignant neoplasm of breast: Secondary | ICD-10-CM | POA: Diagnosis not present

## 2018-05-05 ENCOUNTER — Encounter: Payer: Self-pay | Admitting: Family Medicine

## 2018-05-05 DIAGNOSIS — H2000 Unspecified acute and subacute iridocyclitis: Secondary | ICD-10-CM | POA: Diagnosis not present

## 2018-05-05 DIAGNOSIS — E782 Mixed hyperlipidemia: Secondary | ICD-10-CM | POA: Insufficient documentation

## 2018-05-05 DIAGNOSIS — H209 Unspecified iridocyclitis: Secondary | ICD-10-CM | POA: Insufficient documentation

## 2018-05-05 DIAGNOSIS — H40051 Ocular hypertension, right eye: Secondary | ICD-10-CM | POA: Diagnosis not present

## 2018-05-05 DIAGNOSIS — H401134 Primary open-angle glaucoma, bilateral, indeterminate stage: Secondary | ICD-10-CM | POA: Diagnosis not present

## 2018-05-05 DIAGNOSIS — E049 Nontoxic goiter, unspecified: Secondary | ICD-10-CM | POA: Insufficient documentation

## 2018-05-06 DIAGNOSIS — H401134 Primary open-angle glaucoma, bilateral, indeterminate stage: Secondary | ICD-10-CM | POA: Diagnosis not present

## 2018-05-06 DIAGNOSIS — H2000 Unspecified acute and subacute iridocyclitis: Secondary | ICD-10-CM | POA: Diagnosis not present

## 2018-05-06 DIAGNOSIS — H40051 Ocular hypertension, right eye: Secondary | ICD-10-CM | POA: Diagnosis not present

## 2018-05-06 DIAGNOSIS — H40041 Steroid responder, right eye: Secondary | ICD-10-CM | POA: Diagnosis not present

## 2018-05-07 ENCOUNTER — Encounter: Payer: Self-pay | Admitting: Gastroenterology

## 2018-05-07 ENCOUNTER — Ambulatory Visit (AMBULATORY_SURGERY_CENTER): Payer: Medicare Other | Admitting: Gastroenterology

## 2018-05-07 VITALS — BP 117/88 | HR 74 | Temp 97.5°F | Resp 18 | Ht 66.0 in | Wt 220.0 lb

## 2018-05-07 DIAGNOSIS — K2970 Gastritis, unspecified, without bleeding: Secondary | ICD-10-CM | POA: Diagnosis not present

## 2018-05-07 DIAGNOSIS — K3189 Other diseases of stomach and duodenum: Secondary | ICD-10-CM

## 2018-05-07 DIAGNOSIS — D122 Benign neoplasm of ascending colon: Secondary | ICD-10-CM | POA: Diagnosis not present

## 2018-05-07 DIAGNOSIS — I10 Essential (primary) hypertension: Secondary | ICD-10-CM | POA: Diagnosis not present

## 2018-05-07 DIAGNOSIS — Z1509 Genetic susceptibility to other malignant neoplasm: Secondary | ICD-10-CM

## 2018-05-07 DIAGNOSIS — K219 Gastro-esophageal reflux disease without esophagitis: Secondary | ICD-10-CM | POA: Diagnosis not present

## 2018-05-07 DIAGNOSIS — K2990 Gastroduodenitis, unspecified, without bleeding: Secondary | ICD-10-CM

## 2018-05-07 DIAGNOSIS — K2950 Unspecified chronic gastritis without bleeding: Secondary | ICD-10-CM | POA: Diagnosis not present

## 2018-05-07 MED ORDER — SODIUM CHLORIDE 0.9 % IV SOLN: 500.0000 mL | Freq: Once | INTRAVENOUS | Status: DC

## 2018-05-07 NOTE — Op Note (Signed)
[place] Patient Name: Pamela Lowe Procedure Date: 05/07/2018 2:49 PM MRN: [id number] Endoscopist: Milus Banister , MD Age: 68 Referring MD:  Date of Birth: 08-02-1950 Gender: Female Account #: [id number] Procedure:                Colonoscopy Indications:              Genetically proven Lynch Syndrome (MLH1 mutation);                            colonsocopy 2017 Dr. Watt Climes found no polyps. Medicines:                Monitored Anesthesia Care Procedure:                Pre-Anesthesia Assessment:                           - Prior to the procedure, a History and Physical                            was performed, and patient medications and                            allergies were reviewed. The patient's tolerance of                            previous anesthesia was also reviewed. The risks                            and benefits of the procedure and the sedation                            options and risks were discussed with the patient.                            All questions were answered, and informed consent                            was obtained. Prior Anticoagulants: The patient has                            taken no previous anticoagulant or antiplatelet                            agents. ASA Grade Assessment: II - A patient with                            mild systemic disease. After reviewing the risks                            and benefits, the patient was deemed in                            satisfactory condition to undergo the procedure.  After obtaining informed consent, the colonoscope                            was passed under direct vision. Throughout the                            procedure, the patient's blood pressure, pulse, and                            oxygen saturations were monitored continuously. The                            Colonoscope was introduced through the anus and                            advanced to the  the cecum, identified by                            appendiceal orifice and ileocecal valve. The                            colonoscopy was performed without difficulty. The                            patient tolerated the procedure well. The quality                            of the bowel preparation was good. The ileocecal                            valve, appendiceal orifice, and rectum were                            photographed. Scope In: 3:02:18 PM Scope Out: 3:14:38 PM Scope Withdrawal Time: 0 hours 7 minutes 42 seconds  Total Procedure Duration: 0 hours 12 minutes 20 seconds  Findings:                 A 2 mm polyp was found in the ascending colon. The                            polyp was sessile. The polyp was removed with a                            cold snare. Resection and retrieval were complete.                           Multiple small-mouthed diverticula were found in                            the left colon.                           The exam was otherwise without abnormality on  direct and retroflexion views. Complications:            No immediate complications. Estimated blood loss:                            None. Estimated Blood Loss:     Estimated blood loss: none. Impression:               - One 2 mm polyp in the ascending colon, removed                            with a cold snare. Resected and retrieved.                           - Diverticulosis in the left colon.                           - The examination was otherwise normal on direct                            and retroflexion views. Recommendation:           - Patient has a contact number available for                            emergencies. The signs and symptoms of potential                            delayed complications were discussed with the                            patient. Return to normal activities tomorrow.                            Written discharge instructions  were provided to the                            patient.                           - Resume previous diet.                           - Continue present medications.                           - Await pathology results. Likely repeat                            colonoscopy in 1 year given your Lynch Syndrome. Milus Banister, MD 05/07/2018 3:24:01 PM This report has been signed electronically.

## 2018-05-07 NOTE — Progress Notes (Signed)
Pt's states no medical or surgical changes since previsit or office visit. 

## 2018-05-07 NOTE — Progress Notes (Signed)
Report to PACU, RN, vss, BBS= Clear.  

## 2018-05-07 NOTE — Progress Notes (Signed)
Called to room to assist during endoscopic procedure.  Patient ID and intended procedure confirmed with present staff. Received instructions for my participation in the procedure from the performing physician.  

## 2018-05-07 NOTE — Patient Instructions (Signed)
YOU HAD AN ENDOSCOPIC PROCEDURE TODAY AT [facility]:   Refer to the procedure report that was given to you for any specific questions about what was found during the examination.  If the procedure report does not answer your questions, please call your gastroenterologist to clarify.  If you requested that your care partner not be given the details of your procedure findings, then the procedure report has been included in a sealed envelope for you to review at your convenience later.  YOU SHOULD EXPECT: Some feelings of bloating in the abdomen. Passage of more gas than usual.  Walking can help get rid of the air that was put into your GI tract during the procedure and reduce the bloating. If you had a lower endoscopy (such as a colonoscopy or flexible sigmoidoscopy) you may notice spotting of blood in your stool or on the toilet paper. If you underwent a bowel prep for your procedure, you may not have a normal bowel movement for a few days.  Please Note:  You might notice some irritation and congestion in your nose or some drainage.  This is from the oxygen used during your procedure.  There is no need for concern and it should clear up in a day or so.  SYMPTOMS TO REPORT IMMEDIATELY:   Following lower endoscopy (colonoscopy or flexible sigmoidoscopy):  Excessive amounts of blood in the stool  Significant tenderness or worsening of abdominal pains  Swelling of the abdomen that is new, acute  Fever of 100F or higher   Following upper endoscopy (EGD)  Vomiting of blood or coffee ground material  New chest pain or pain under the shoulder blades  Painful or persistently difficult swallowing  New shortness of breath  Fever of 100F or higher  Black, tarry-looking stools  For urgent or emergent issues, a gastroenterologist can be reached at any hour by calling [phone].   DIET:  We do recommend a small meal at first, but then you may proceed to your regular diet.  Drink  plenty of fluids but you should avoid alcoholic beverages for 24 hours. We recommend a high fiber diet, and drink plenty of water.  ACTIVITY:  You should plan to take it easy for the rest of today and you should NOT DRIVE or use heavy machinery until tomorrow (because of the sedation medicines used during the test).    FOLLOW UP: Our staff will call the number listed on your records the next business day following your procedure to check on you and address any questions or concerns that you may have regarding the information given to you following your procedure. If we do not reach you, we will leave a message.  However, if you are feeling well and you are not experiencing any problems, there is no need to return our call.  We will assume that you have returned to your regular daily activities without incident.  Recall EGD 3 years.  Recall colonoscopy 1 year.  If any biopsies were taken you will be contacted by phone or by letter within the next 1-3 weeks.  Please call us at [phone] if you have not heard about the biopsies in 3 weeks.    SIGNATURES/CONFIDENTIALITY: You and/or your care partner have signed paperwork which will be entered into your electronic medical record.  These signatures attest to the fact that that the information above on your After Visit Summary has been reviewed and is understood.  Full responsibility of the confidentiality of  this discharge information lies with you and/or your care-partner.  Read all handouts given to you by your recovery room nurse.

## 2018-05-07 NOTE — Op Note (Signed)
[place] Patient Name: Pamela Lowe Procedure Date: 05/07/2018 2:49 PM MRN: [id number] Endoscopist: Milus Banister , MD Age: 68 Referring MD:  Date of Birth: 1950/06/23 Gender: Female Account #: [id number] Procedure:                Upper GI endoscopy Indications:              Genetically proven Lynch Syndrome (MLH1 mutation) Medicines:                Monitored Anesthesia Care Procedure:                Pre-Anesthesia Assessment:                           - Prior to the procedure, a History and Physical                            was performed, and patient medications and                            allergies were reviewed. The patient's tolerance of                            previous anesthesia was also reviewed. The risks                            and benefits of the procedure and the sedation                            options and risks were discussed with the patient.                            All questions were answered, and informed consent                            was obtained. Prior Anticoagulants: The patient has                            taken no previous anticoagulant or antiplatelet                            agents. ASA Grade Assessment: II - A patient with                            mild systemic disease. After reviewing the risks                            and benefits, the patient was deemed in                            satisfactory condition to undergo the procedure.                           After obtaining informed consent, the endoscope was  passed under direct vision. Throughout the                            procedure, the patient's blood pressure, pulse, and                            oxygen saturations were monitored continuously. The                            Endoscope was introduced through the mouth, and                            advanced to the second part of duodenum. The upper                            GI  endoscopy was accomplished without difficulty.                            The patient tolerated the procedure well. Scope In: Scope Out: Findings:                 Mild inflammation characterized by friability and                            granularity was found in the gastric antrum.                            Biopsies were taken with a cold forceps for                            histology.                           The exam was otherwise without abnormality. Complications:            No immediate complications. Estimated blood loss:                            None. Estimated Blood Loss:     Estimated blood loss: none. Impression:               - Gastritis. Biopsied.                           - The examination was otherwise normal. Recommendation:           - Patient has a contact number available for                            emergencies. The signs and symptoms of potential                            delayed complications were discussed with the                            patient. Return to normal activities tomorrow.  Written discharge instructions were provided to the                            patient.                           - Resume previous diet.                           - Continue present medications.                           - Await pathology results.                           - Recall EGD in 3 years given Lynch Syndrome. Milus Banister, MD 05/07/2018 3:26:05 PM This report has been signed electronically.

## 2018-05-08 ENCOUNTER — Telehealth: Payer: Self-pay

## 2018-05-08 NOTE — Telephone Encounter (Signed)
Called pt left a message to check with [facility] imaging if they could schedule her for a F/U for her thyroid U/S to check for the nodule on her Left lobe. Advise pt to call the office if she needs [place] to schedule the appointment for her

## 2018-05-08 NOTE — Telephone Encounter (Signed)
Attempted to reach patient for post-procedure f/u call. No answer. Left message that we will make another attempt to reach her again later today and for her to please not hesitate to call us if she has any questions/concerns regarding her care. 

## 2018-05-09 DIAGNOSIS — H2000 Unspecified acute and subacute iridocyclitis: Secondary | ICD-10-CM | POA: Diagnosis not present

## 2018-05-09 DIAGNOSIS — H40051 Ocular hypertension, right eye: Secondary | ICD-10-CM | POA: Diagnosis not present

## 2018-05-09 DIAGNOSIS — H401134 Primary open-angle glaucoma, bilateral, indeterminate stage: Secondary | ICD-10-CM | POA: Diagnosis not present

## 2018-05-13 NOTE — Telephone Encounter (Signed)
° ° °  Pt call back and said she is seeing a Endo about her thyroid. She would like a referral to see a cardiologist per Dr Volanda Napoleon request. She has not heard from anyone about the referral

## 2018-05-14 ENCOUNTER — Encounter: Payer: Self-pay | Admitting: Gastroenterology

## 2018-05-16 ENCOUNTER — Telehealth: Payer: Self-pay

## 2018-05-16 DIAGNOSIS — I10 Essential (primary) hypertension: Secondary | ICD-10-CM | POA: Diagnosis not present

## 2018-05-16 DIAGNOSIS — E7800 Pure hypercholesterolemia, unspecified: Secondary | ICD-10-CM | POA: Diagnosis not present

## 2018-05-16 DIAGNOSIS — D649 Anemia, unspecified: Secondary | ICD-10-CM | POA: Diagnosis not present

## 2018-05-16 NOTE — Telephone Encounter (Signed)
Author phoned pt. to offer to reschedule awv, and pt. stated she has been too busy with other specialist appointments, but will reschedule at another time. Author asked pt. If she had heard anything back about a cardiology referral per telephone encounter 2/20,  and pt. Stated she had not, and is growing concerned. "I've been short of breath, and I"ve called a couple of times, but have heard nothing". Author stated she would attempt to expedite referral placement per Dr. Volanda Napoleon' approval, as pt. Would like to proceed.

## 2018-05-21 ENCOUNTER — Other Ambulatory Visit: Payer: Self-pay | Admitting: Family Medicine

## 2018-05-21 DIAGNOSIS — Z0000 Encounter for general adult medical examination without abnormal findings: Secondary | ICD-10-CM | POA: Diagnosis not present

## 2018-05-21 DIAGNOSIS — G4700 Insomnia, unspecified: Secondary | ICD-10-CM | POA: Diagnosis not present

## 2018-05-21 DIAGNOSIS — E042 Nontoxic multinodular goiter: Secondary | ICD-10-CM | POA: Diagnosis not present

## 2018-05-21 DIAGNOSIS — Z1509 Genetic susceptibility to other malignant neoplasm: Secondary | ICD-10-CM | POA: Diagnosis not present

## 2018-05-21 DIAGNOSIS — E7800 Pure hypercholesterolemia, unspecified: Secondary | ICD-10-CM | POA: Diagnosis not present

## 2018-05-21 DIAGNOSIS — H209 Unspecified iridocyclitis: Secondary | ICD-10-CM | POA: Diagnosis not present

## 2018-05-21 DIAGNOSIS — F17200 Nicotine dependence, unspecified, uncomplicated: Secondary | ICD-10-CM | POA: Diagnosis not present

## 2018-05-21 DIAGNOSIS — R0602 Shortness of breath: Secondary | ICD-10-CM

## 2018-05-21 DIAGNOSIS — K219 Gastro-esophageal reflux disease without esophagitis: Secondary | ICD-10-CM | POA: Diagnosis not present

## 2018-05-21 DIAGNOSIS — I10 Essential (primary) hypertension: Secondary | ICD-10-CM | POA: Diagnosis not present

## 2018-05-21 DIAGNOSIS — R0789 Other chest pain: Secondary | ICD-10-CM | POA: Diagnosis not present

## 2018-05-21 NOTE — Progress Notes (Addendum)
Cardiology Office Note:    Date:  05/22/2018   ID:  Pamela Lowe, DOB Feb 18, 1951, MRN [id number]  PCP:  Pamela Stalker, PA-C  Cardiologist:  No primary care provider on file.   Referring MD: Pamela Ruddy, MD   Chief Complaint  Patient presents with  . Shortness of Breath    Dyspnea on exertion    History of Present Illness:    Pamela Lowe is a 68 y.o. female with a hx of hyperlipidemia, hypertension who is referred by Pamela Stalker, PA-C for evaluation of dyspnea.  [name] is referred by her primary care provider, Pamela Stalker, PA-C because of dyspnea on exertion.  It occurs with climbing stairs and with walking briskly up incline.  There is no associated chest discomfort.  Dyspnea does not occur while walking on a flat plane.  She has had shortness of breath in the past and has had previous cardiac evaluation with an exercise treadmill test at [facility] and September 2017.  Other cardiac studies were also done according to the patient but she cannot give any particular detail.  Doppler study performed in January 2018 demonstrated absent superficial femoral blood flow.  This was performed at [facility]  She has family history of coronary disease with a 66-year-old brother dying suddenly.  Family history of MI in her maternal grandfather.  The patient smoked until 2011, greater than 40 years, greater than 1 pack/day.  Recently diagnosed with rheumatoid arthritis and uveitis.  Past Medical History:  Diagnosis Date  . Allergic rhinitis   . Breast cyst 2007   Right  . Cataract    surgery 07/2017  . Depression 2006   w/ menopause  . Family history of breast cancer   . Family history of colon cancer   . Family history of kidney cancer 01/15/2018  . Family history of lung cancer   . Family history of pancreatic cancer   . Family history of prostate cancer   . Family history of uterine cancer   . GERD  (gastroesophageal reflux disease)   . Glaucoma   . History of hysterectomy, supracervical   . Hyperlipidemia   . Hypertension   . 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   MLH1 [id number] (Splice site)  . Multinodular goiter   . Tobacco user     Past Surgical History:  Procedure Laterality Date  . BREAST BIOPSY Bilateral 1985  . CATARACT EXTRACTION Right 07/2017  . COLONOSCOPY    . SUPRACERVICAL ABDOMINAL HYSTERECTOMY  1986   h/o supracervical hysterectomy  . UPPER GASTROINTESTINAL ENDOSCOPY      Current Medications: Current Meds  Medication Sig  . aspirin 325 MG tablet Take 1 tablet by mouth daily.  . benazepril (LOTENSIN) 20 MG tablet Take 1 tablet (20 mg total) by mouth daily.  . brimonidine-timolol (COMBIGAN) 0.2-0.5 % ophthalmic solution Apply 1 drop to eye 3 (three) times daily.  . Golimumab (SIMPONI ARIA IV) Inject 12.5 mLs into the vein.  . hydrochlorothiazide (,MICROZIDE/HYDRODIURIL,) 12.5 MG capsule Take 12.5 mg by mouth daily.    . Omega-3 Fatty Acids (FISH OIL PO) Take by mouth daily.  [name] omeprazole (PRILOSEC) 20 MG capsule Take 20 mg by mouth 2 (two) times daily.  . prednisoLONE acetate (PRED FORTE) 1 % ophthalmic suspension Place 1 drop into both eyes 5 (five) times daily.   . predniSONE (DELTASONE) 10 MG tablet Take 15 mg by mouth daily.   . Probiotic Product (ALIGN) 4 MG CAPS daily.  [name]  rosuvastatin (CRESTOR) 20 MG tablet Take 20 mg by mouth daily.  . [DISCONTINUED] rosuvastatin (CRESTOR) 40 MG tablet Take 1 tablet (40 mg total) by mouth daily.     Allergies:   Bactrim [sulfamethoxazole-trimethoprim] and Wellbutrin [bupropion hcl]   Social History   Socioeconomic History  . Marital status: Married    Spouse name: Pamela Lowe  . Number of children: 0  . Years of education: Not on file  . Highest education level: Not on file  Occupational History  . Occupation: Special Ed Teacher    Comment: [employer]  . Financial resource strain: Not on  file  . Food insecurity:    Worry: Not on file    Inability: Not on file  . Transportation needs:    Medical: Not on file    Non-medical: Not on file  Tobacco Use  . Smoking status: Former Smoker    Packs/day: 0.25    Years: 30.00    Pack years: 7.50    Types: Cigarettes    Last attempt to quit: 08/01/2017    Years since quitting: 0.8  . Smokeless tobacco: Never Used  . Tobacco comment: using nicoderm patches  Substance and Sexual Activity  . Alcohol use: Yes    Alcohol/week: 4.0 standard drinks    Types: 4 Glasses of wine per week    Comment: very rare  . Drug use: No  . Sexual activity: Not Currently    Partners: Male    Birth control/protection: Surgical, Post-menopausal    Comment: Hysterectomy  Lifestyle  . Physical activity:    Days per week: Not on file    Minutes per session: Not on file  . Stress: Not on file  Relationships  . Social connections:    Talks on phone: Not on file    Gets together: Not on file    Attends religious service: Not on file    Active member of club or organization: Not on file    Attends meetings of clubs or organizations: Not on file    Relationship status: Not on file  Other Topics Concern  . Not on file  Social History Narrative  . Not on file     Family History: The patient's family history includes Allergies in her brother; Alzheimer's disease in her mother; Brain cancer in her paternal aunt; Breast cancer (age of onset: 10) in her sister; Breast cancer (age of onset: 55) in her sister; Breast cancer (age of onset: 98) in her sister; Breast cancer (age of onset: 9) in her cousin; Breast cancer (age of onset: 72) in her maternal aunt; Clotting disorder in her sister; Colon cancer (age of onset: 30) in her paternal grandfather; Deep vein thrombosis in her mother and sister; Heart attack in her maternal grandfather; Heart attack (age of onset: 61) in her brother; Hypertension in her father and mother; Kidney cancer (age of onset: 20)  in her cousin; Kidney cancer (age of onset: 57) in her father; Kidney cancer (age of onset: 93) in her cousin; Lung cancer (age of onset: 39) in her paternal uncle; Lung cancer (age of onset: 16) in her maternal uncle; Osteopenia in her mother; Pancreatic cancer (age of onset: 91) in her paternal uncle; Pancreatic cancer (age of onset: 55) in her paternal aunt; Pancreatic cancer (age of onset: 84) in her paternal aunt; Pneumonia in her father; Prostate cancer (age of onset: 66) in her cousin; Prostate cancer (age of onset: 53) in her maternal uncle; Uterine cancer (age  of onset: 3) in her cousin; Uterine cancer (age of onset: 64) in her paternal grandmother.  ROS:   Please see the history of present illness.    Inflammatory uveitis related to rheumatoid arthritis.  She has had shortness of breath, abdominal pain, back pain, nausea, and occasionally has chest discomfort.  She denies orthopnea.  All other systems reviewed and are negative.  EKGs/Labs/Other Studies Reviewed:    The following studies were reviewed today: There are vascular ultrasound and 1 nuclear perfusion study from [facility] that in summary are difficult to interpret.  The vascular studies suggest that she has PAD with occlusion of the right superficial femoral.  Carotid study did not have a conclusion.  The nuclear study also did not have a conclusion.  LATE ENTRY DATA review 06/16/2018 from [facility]:  Electrocardiogram performed March 20, 2017 revealed left anterior hemiblock  Myocardial perfusion study performed 12/08/2015 did not reveal evidence of ischemia or fixed defect.  EF 62%  Bilateral irregular atheromatous carotid plaque without abnormal waveforms or peak systolic velocities performed 12/01/2015  Abnormal ankle-brachial index suggesting mild obstructive disease in the right lower extremity from study performed 12/01/2015.  Bilateral arterial duplex performed 12/01/2015 demonstrates mid to distal right  superficial femoral artery occlusion with reconstitution.  Moderate noncritical left mid superficial femoral artery stenosis.  Echocardiogram 12/02/2015.  Mild left atrial abnormality.  Otherwise unremarkable.  EKG:  EKG the EKG reveals normal sinus rhythm with left anterior hemiblock.  Otherwise no significant abnormality is noted.  Recent Labs: 09/23/2017: BUN 14; Creatinine, Ser 1.10; Hemoglobin 15.3; Platelets 315; Potassium 3.7; Sodium 140  Recent Lipid Panel No results found for: CHOL, TRIG, HDL, CHOLHDL, VLDL, LDLCALC, LDLDIRECT  Physical Exam:    VS:  BP 114/82   Pulse 69   Ht 5' 6"  (1.676 m)   Wt 224 lb 12.8 oz (102 kg)   LMP 03/19/1998 Comment: h/o supracervical hysterectomy  SpO2 96%   BMI 36.28 kg/m     Wt Readings from Last 3 Encounters:  05/22/18 224 lb 12.8 oz (102 kg)  05/07/18 220 lb (99.8 kg)  05/01/18 222 lb (100.7 kg)     GEN: Morbid obesity. No acute distress HEENT: Normal NECK: No JVD. LYMPHATICS: No lymphadenopathy CARDIAC: RRR.  1/6 systolic murmur, S4 gallop, no edema VASCULAR: Absent bilateral pedal pulses, no bruits RESPIRATORY:  Clear to auscultation without rales, wheezing or rhonchi  ABDOMEN: Soft, non-tender, non-distended, No pulsatile mass, MUSCULOSKELETAL: No deformity  SKIN: Warm and dry NEUROLOGIC:  Alert and oriented x 3 PSYCHIATRIC:  Normal affect   ASSESSMENT:    1. DOE (dyspnea on exertion)   2. Essential hypertension   3. Mixed hyperlipidemia   4. SOB (shortness of breath)   5. PAD (peripheral artery disease) (HCC)    PLAN:    In order of problems listed above:  1. The patient's dyspnea on exertion have possible explanations that could include diastolic heart failure related to obesity/age/hypertension.  An alternative consideration would be CAD with ischemia induced diastolic dysfunction.  She also is a chronic long-term smoker and dyspnea could be related to chronic lung disease.  Also with weight gain, dyspnea could be  related to physical deconditioning.  We will get a BNP.  She will have a 2D Doppler echocardiogram.  We will obtain official interpretations of all the cardiovascular studies done at [facility] within the past 3 to 4 years.  I do believe she may need to have a coronary calcium score.  If  this suggestion that she has PAD is true, she will need to have a coronary CTA to exclude significant obstructive disease.  If there is no evidence of plaque found on any of the studies from [place], perhaps we would simply benefit from a coronary calcium score to strengthen now resolved for risk factor modification. 2. Blood pressure target 130/80 mmHg discussed in detail with the patient.  Current blood pressure is better than target. 3. LDL target less than 70.  Most recent LDL was not acceptable especially if she has PAD or any evidence of atherosclerosis. 4. Please see dialogue above under dyspnea on exertion 5. Secondary risk prevention is in order.  We need to verify this diagnosis.  Overall education and awareness concerning primary/secondary risk prevention was discussed in detail: LDL less than 70, hemoglobin A1c less than 7, blood pressure target less than 130/80 mmHg, >150 minutes of moderate aerobic activity per week, avoidance of smoking, weight control (via diet and exercise), and continued surveillance/management of/for obstructive sleep apnea.  Obtain vascular, carotid, and cardiac imaging/functional studies from [facility].  2D Doppler echocardiogram and BNP will be obtained today.  Clinical follow-up in 1 month.   Medication Adjustments/Labs and Tests Ordered: Current medicines are reviewed at length with the patient today.  Concerns regarding medicines are outlined above.  Orders Placed This Encounter  Procedures  . Pro b natriuretic peptide  . EKG 12-Lead  . ECHOCARDIOGRAM COMPLETE   No orders of the defined types were placed in this encounter.   Patient  Instructions  Medication Instructions:  Your physician recommends that you continue on your current medications as directed. Please refer to the Current Medication list given to you today.  If you need a refill on your cardiac medications before your next appointment, please call your pharmacy.   Lab work: Pro BNP today  If you have labs (blood work) drawn today and your tests are completely normal, you will receive your results only by: [name] MyChart Message (if you have MyChart) OR . A paper copy in the mail If you have any lab test that is abnormal or we need to change your treatment, we will call you to review the results.  Testing/Procedures: Your physician has requested that you have an echocardiogram. Echocardiography is a painless test that uses sound waves to create images of your heart. It provides your doctor with information about the size and shape of your heart and how well your heart's chambers and valves are working. This procedure takes approximately one hour. There are no restrictions for this procedure.   Follow-Up: At [facility], you and your health needs are our priority.  As part of our continuing mission to provide you with exceptional heart care, we have created designated Provider Care Teams.  These Care Teams include your primary Cardiologist (physician) and Advanced Practice Providers (APPs -  Physician Assistants and Nurse Practitioners) who all work together to provide you with the care you need, when you need it. You will need a follow up appointment in 1 months (Can have 4/8 at 10A) .  Please call our office 2 months in advance to schedule this appointment.  You may see Dr. Tamala Julian or one of the following Advanced Practice Providers on your designated Care Team:   [name], NP Pamela Kicks, NP . Kathyrn Drown, NP  Any Other Special Instructions Will Be Listed Below (If Applicable).       Signed, Sinclair Grooms, MD  05/22/2018 2:25  PM    [facility]

## 2018-05-21 NOTE — Telephone Encounter (Signed)
Noted  

## 2018-05-21 NOTE — Telephone Encounter (Signed)
Author phoned pt. to notify that Dr. Volanda Napoleon placed cardio referral. Pt. stated she has seen another provider in the meantime who has referred her to cardiology for SOB. Pt. not interested in pursuing further care with practice at this time. Pt. appreciate of call and update however. Practice administrator made aware.

## 2018-05-22 ENCOUNTER — Encounter: Payer: Self-pay | Admitting: Interventional Cardiology

## 2018-05-22 ENCOUNTER — Telehealth: Payer: Self-pay | Admitting: Interventional Cardiology

## 2018-05-22 ENCOUNTER — Ambulatory Visit (INDEPENDENT_AMBULATORY_CARE_PROVIDER_SITE_OTHER): Payer: Medicare Other | Admitting: Interventional Cardiology

## 2018-05-22 VITALS — BP 114/82 | HR 69 | Ht 66.0 in | Wt 224.8 lb

## 2018-05-22 DIAGNOSIS — R0600 Dyspnea, unspecified: Secondary | ICD-10-CM

## 2018-05-22 DIAGNOSIS — I739 Peripheral vascular disease, unspecified: Secondary | ICD-10-CM | POA: Diagnosis not present

## 2018-05-22 DIAGNOSIS — M0589 Other rheumatoid arthritis with rheumatoid factor of multiple sites: Secondary | ICD-10-CM | POA: Diagnosis not present

## 2018-05-22 DIAGNOSIS — R0609 Other forms of dyspnea: Secondary | ICD-10-CM | POA: Diagnosis not present

## 2018-05-22 DIAGNOSIS — I10 Essential (primary) hypertension: Secondary | ICD-10-CM

## 2018-05-22 DIAGNOSIS — R0602 Shortness of breath: Secondary | ICD-10-CM

## 2018-05-22 DIAGNOSIS — E782 Mixed hyperlipidemia: Secondary | ICD-10-CM

## 2018-05-22 NOTE — Telephone Encounter (Signed)
Medical records requested from [facility] 05/22/18 vlm

## 2018-05-22 NOTE — Patient Instructions (Signed)
Medication Instructions:  Your physician recommends that you continue on your current medications as directed. Please refer to the Current Medication list given to you today.  If you need a refill on your cardiac medications before your next appointment, please call your pharmacy.   Lab work: Pro BNP today  If you have labs (blood work) drawn today and your tests are completely normal, you will receive your results only by: [name] MyChart Message (if you have MyChart) OR . A paper copy in the mail If you have any lab test that is abnormal or we need to change your treatment, we will call you to review the results.  Testing/Procedures: Your physician has requested that you have an echocardiogram. Echocardiography is a painless test that uses sound waves to create images of your heart. It provides your doctor with information about the size and shape of your heart and how well your heart's chambers and valves are working. This procedure takes approximately one hour. There are no restrictions for this procedure.   Follow-Up: At [facility], you and your health needs are our priority.  As part of our continuing mission to provide you with exceptional heart care, we have created designated Provider Care Teams.  These Care Teams include your primary Cardiologist (physician) and Advanced Practice Providers (APPs -  Physician Assistants and Nurse Practitioners) who all work together to provide you with the care you need, when you need it. You will need a follow up appointment in 1 months (Can have 4/8 at 10A) .  Please call our office 2 months in advance to schedule this appointment.  You may see Dr. Tamala Julian or one of the following Advanced Practice Providers on your designated Care Team:   [name], NP Cecilie Kicks, NP . Kathyrn Drown, NP  Any Other Special Instructions Will Be Listed Below (If Applicable).

## 2018-05-23 LAB — PRO B NATRIURETIC PEPTIDE: NT-PRO BNP: 37 pg/mL (ref 0–301)

## 2018-05-26 ENCOUNTER — Ambulatory Visit: Payer: Medicare Other

## 2018-05-27 ENCOUNTER — Ambulatory Visit (HOSPITAL_COMMUNITY): Payer: Medicare Other | Attending: Cardiovascular Disease

## 2018-05-27 DIAGNOSIS — R0602 Shortness of breath: Secondary | ICD-10-CM | POA: Insufficient documentation

## 2018-06-02 DIAGNOSIS — H401134 Primary open-angle glaucoma, bilateral, indeterminate stage: Secondary | ICD-10-CM | POA: Diagnosis not present

## 2018-06-02 DIAGNOSIS — H2000 Unspecified acute and subacute iridocyclitis: Secondary | ICD-10-CM | POA: Diagnosis not present

## 2018-06-02 DIAGNOSIS — H40051 Ocular hypertension, right eye: Secondary | ICD-10-CM | POA: Diagnosis not present

## 2018-06-05 ENCOUNTER — Telehealth: Payer: Self-pay | Admitting: Interventional Cardiology

## 2018-06-05 DIAGNOSIS — Z79899 Other long term (current) drug therapy: Secondary | ICD-10-CM | POA: Diagnosis not present

## 2018-06-05 DIAGNOSIS — M2550 Pain in unspecified joint: Secondary | ICD-10-CM | POA: Diagnosis not present

## 2018-06-05 DIAGNOSIS — R768 Other specified abnormal immunological findings in serum: Secondary | ICD-10-CM | POA: Diagnosis not present

## 2018-06-05 DIAGNOSIS — Z6836 Body mass index (BMI) 36.0-36.9, adult: Secondary | ICD-10-CM | POA: Diagnosis not present

## 2018-06-05 DIAGNOSIS — H209 Unspecified iridocyclitis: Secondary | ICD-10-CM | POA: Diagnosis not present

## 2018-06-05 DIAGNOSIS — M0589 Other rheumatoid arthritis with rheumatoid factor of multiple sites: Secondary | ICD-10-CM | POA: Diagnosis not present

## 2018-06-05 DIAGNOSIS — E669 Obesity, unspecified: Secondary | ICD-10-CM | POA: Diagnosis not present

## 2018-06-05 NOTE — Telephone Encounter (Signed)
Medical records received from [facility]. 06/05/18 vlm

## 2018-06-09 ENCOUNTER — Ambulatory Visit (INDEPENDENT_AMBULATORY_CARE_PROVIDER_SITE_OTHER): Payer: Medicare Other | Admitting: Endocrinology

## 2018-06-09 ENCOUNTER — Other Ambulatory Visit: Payer: Self-pay

## 2018-06-09 ENCOUNTER — Encounter: Payer: Self-pay | Admitting: Endocrinology

## 2018-06-09 VITALS — BP 118/76 | HR 65 | Temp 98.1°F | Resp 12 | Ht 66.0 in | Wt 229.0 lb

## 2018-06-09 DIAGNOSIS — E049 Nontoxic goiter, unspecified: Secondary | ICD-10-CM

## 2018-06-09 NOTE — Progress Notes (Signed)
Subjective:    Patient ID: Pamela Lowe, female    DOB: 11-Jun-1950, 68 y.o.   MRN: [id number]  HPI Pt is referred by [name], PA, for hyperthyroidism.  Pt reports he was dx'ed with MNG in 2000, when she lived in [place].  she has never been on therapy for this.  she has never had XRT to the anterior neck, or thyroid surgery.  she does not consume kelp or any other prescribed or non-prescribed thyroid medication.  she has never been on amiodarone.  She has slight weight gain, and assoc itching.   Past Medical History:  Diagnosis Date   Allergic rhinitis    Breast cyst 2007   Right   Cataract    surgery 07/2017   Depression 2006   w/ menopause   Family history of breast cancer    Family history of colon cancer    Family history of kidney cancer 01/15/2018   Family history of lung cancer    Family history of pancreatic cancer    Family history of prostate cancer    Family history of uterine cancer    GERD (gastroesophageal reflux disease)    Glaucoma    History of hysterectomy, supracervical    Hyperlipidemia    Hypertension    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   MLH1 [id number] (Splice site)   Multinodular goiter    Tobacco user     Past Surgical History:  Procedure Laterality Date   BREAST BIOPSY Bilateral 1985   CATARACT EXTRACTION Right 07/2017   COLONOSCOPY     SUPRACERVICAL ABDOMINAL HYSTERECTOMY  1986   h/o supracervical hysterectomy   UPPER GASTROINTESTINAL ENDOSCOPY      Social History   Socioeconomic History   Marital status: Married    Spouse name: [name]   Number of children: 0   Years of education: Not on file   Highest education level: Not on file  Occupational History   Occupation: Special Ed Pharmacist, hospital    Comment: Public affairs consultant strain: Not on file   Food insecurity:    Worry: Not on file    Inability: Not on file   Transportation needs:    Medical: Not on file   Non-medical: Not on file  Tobacco Use   Smoking status: Former Smoker    Packs/day: 0.25    Years: 30.00    Pack years: 7.50    Types: Cigarettes    Last attempt to quit: 08/01/2017    Years since quitting: 0.8   Smokeless tobacco: Never Used   Tobacco comment: using nicoderm patches  Substance and Sexual Activity   Alcohol use: Not Currently   Drug use: No   Sexual activity: Not Currently    Partners: Male    Birth control/protection: Surgical, Post-menopausal    Comment: Hysterectomy  Lifestyle   Physical activity:    Days per week: Not on file    Minutes per session: Not on file   Stress: Not on file  Relationships   Social connections:    Talks on phone: Not on file    Gets together: Not on file    Attends religious service: Not on file    Active member of club or organization: Not on file    Attends meetings of clubs or organizations: Not on file    Relationship status: Not on file   Intimate partner violence:    Fear of current or ex partner: Not on file  Emotionally abused: Not on file    Physically abused: Not on file    Forced sexual activity: Not on file  Other Topics Concern   Not on file  Social History Narrative   Not on file    Current Outpatient Medications on File Prior to Visit  Medication Sig Dispense Refill   aspirin 325 MG tablet Take 1 tablet by mouth daily.     benazepril (LOTENSIN) 20 MG tablet Take 1 tablet (20 mg total) by mouth daily.     brimonidine-timolol (COMBIGAN) 0.2-0.5 % ophthalmic solution Apply 1 drop to eye 3 (three) times daily.     Golimumab (SIMPONI ARIA IV) Inject 12.5 mLs into the vein.     hydrochlorothiazide (,MICROZIDE/HYDRODIURIL,) 12.5 MG capsule Take 12.5 mg by mouth daily.       Omega-3 Fatty Acids (FISH OIL PO) Take by mouth daily.     omeprazole (PRILOSEC) 20 MG capsule Take 20 mg by mouth 2 (two) times daily.  2   prednisoLONE acetate (PRED FORTE) 1 % ophthalmic suspension Place 1 drop into  both eyes 5 (five) times daily.      predniSONE (DELTASONE) 10 MG tablet Take 15 mg by mouth daily.      Probiotic Product (ALIGN) 4 MG CAPS daily.     rosuvastatin (CRESTOR) 20 MG tablet Take 20 mg by mouth daily.     No current facility-administered medications on file prior to visit.     Allergies  Allergen Reactions   Bactrim [Sulfamethoxazole-Trimethoprim] Other (See Comments)    Chills/achy   Wellbutrin [Bupropion Hcl] Rash    rash    Family History  Problem Relation Age of Onset   Kidney cancer Father 43   Pneumonia Father    Hypertension Father    Hypertension Mother    Deep vein thrombosis Mother    Alzheimer's disease Mother    Osteopenia Mother    Heart attack Maternal Grandfather    Breast cancer Sister 35   Allergies Brother    Breast cancer Sister 97       Stage 0   Breast cancer Sister 47       Stage 3   Heart attack Brother 32   Clotting disorder Sister    Deep vein thrombosis Sister        unclear cause   Pancreatic cancer Paternal Aunt 36   Breast cancer Maternal Aunt 67   Prostate cancer Maternal Uncle 68   Pancreatic cancer Paternal Uncle 27   Uterine cancer Paternal Grandmother 70   Colon cancer Paternal Grandfather 84   Pancreatic cancer Paternal Aunt 32   Lung cancer Paternal Uncle 28   Brain cancer Paternal Aunt    Kidney cancer Cousin 74   Kidney cancer Cousin 52   Uterine cancer Cousin 62   Prostate cancer Cousin 57   Lung cancer Maternal Uncle 60   Breast cancer Cousin 60   Thyroid disease Neg Hx     BP 118/76    Pulse 65    Temp 98.1 F (36.7 C)    Resp 12    Ht 5' 6"  (1.676 m)    Wt 229 lb (103.9 kg)    LMP 03/19/1998 Comment: h/o supracervical hysterectomy   SpO2 96%    BMI 36.96 kg/m   Review of Systems denies fever, headache, hoarseness, diplopia, palpitations, diarrhea, polyuria, muscle weakness, edema, excessive diaphoresis, tremor, anxiety, and heat intolerance. She has chronic doe,  rhinorrhea, easy bruising, and fatigue.  Objective:   Physical Exam VS: see vs page GEN: no distress HEAD: head: no deformity eyes: no periorbital swelling, no proptosis external nose and ears are normal mouth: no lesion seen NECK: thyroid is slightly enlarged, with irreg surface, but no palpable nodule CHEST WALL: no deformity LUNGS: clear to auscultation CV: reg rate and rhythm, no murmur ABD: abdomen is soft, nontender.  no hepatosplenomegaly.  not distended.  no hernia MUSCULOSKELETAL: muscle bulk and strength are grossly normal.  no obvious joint swelling.  gait is normal and steady EXTEMITIES: no deformity.  no edema PULSES: no carotid bruit NEURO:  cn 2-12 grossly intact.   readily moves all 4's.  sensation is intact to touch on all 4's.  No tremor.  SKIN:  Normal texture and temperature.  No rash or suspicious lesion is visible.  Not diaphoretic NODES:  None palpable at the neck PSYCH: alert, well-oriented.  Does not appear anxious nor depressed.  [place]: MNG: Left nodule 3 meets criteria for annual follow-up  outside test results are reviewed: TSH=0.32  I have reviewed outside records, and summarized: Pt was noted to have suppressed TSH, and referred here.  Other probs addressed were wellness, insomnia, and chest pain.      Assessment & Plan:  MNG: new to me.  Hyperthyroidism: although this is minimal, it reduces the risk of malignancy to the same as it would be after a class 2 bx result.  However, she will develop overt hyperthyroidism with time.   Patient Instructions  Let's recheck the ultrasound.  you will receive a phone call, about a day and time for an appointment. most of the time, a "lumpy thyroid" will eventually become overactive.  this is usually a slow process, happening over the span of many years. Please come back for a follow-up appointment in 1 year.

## 2018-06-09 NOTE — Patient Instructions (Signed)
Let's recheck the ultrasound.  you will receive a phone call, about a day and time for an appointment. most of the time, a "lumpy thyroid" will eventually become overactive.  this is usually a slow process, happening over the span of many years. Please come back for a follow-up appointment in 1 year.   

## 2018-06-12 DIAGNOSIS — H2000 Unspecified acute and subacute iridocyclitis: Secondary | ICD-10-CM | POA: Diagnosis not present

## 2018-06-12 DIAGNOSIS — H40051 Ocular hypertension, right eye: Secondary | ICD-10-CM | POA: Diagnosis not present

## 2018-06-12 DIAGNOSIS — H401134 Primary open-angle glaucoma, bilateral, indeterminate stage: Secondary | ICD-10-CM | POA: Diagnosis not present

## 2018-06-16 ENCOUNTER — Ambulatory Visit: Payer: Medicare Other | Admitting: Internal Medicine

## 2018-06-25 ENCOUNTER — Ambulatory Visit: Payer: Medicare Other | Admitting: Interventional Cardiology

## 2018-06-26 DIAGNOSIS — E0590 Thyrotoxicosis, unspecified without thyrotoxic crisis or storm: Secondary | ICD-10-CM | POA: Diagnosis not present

## 2018-06-26 DIAGNOSIS — E049 Nontoxic goiter, unspecified: Secondary | ICD-10-CM | POA: Diagnosis not present

## 2018-06-26 DIAGNOSIS — I10 Essential (primary) hypertension: Secondary | ICD-10-CM | POA: Diagnosis not present

## 2018-06-26 DIAGNOSIS — L0291 Cutaneous abscess, unspecified: Secondary | ICD-10-CM | POA: Diagnosis not present

## 2018-07-17 DIAGNOSIS — M0589 Other rheumatoid arthritis with rheumatoid factor of multiple sites: Secondary | ICD-10-CM | POA: Diagnosis not present

## 2018-07-22 ENCOUNTER — Telehealth: Payer: Self-pay | Admitting: Interventional Cardiology

## 2018-07-22 NOTE — Progress Notes (Signed)
Virtual Visit via Video Note   This visit type was conducted due to national recommendations for restrictions regarding the COVID-19 Pandemic (e.g. social distancing) in an effort to limit this patient's exposure and mitigate transmission in our community.  Due to her co-morbid illnesses, this patient is at least at moderate risk for complications without adequate follow up.  This format is felt to be most appropriate for this patient at this time.  All issues noted in this document were discussed and addressed.  A limited physical exam was performed with this format.  Please refer to the patient's chart for her consent to telehealth for [facility].   Date:  07/23/2018   ID:  Pamela Lowe, DOB 07-22-1950, MRN [id number]  Patient Location: Home Provider Location: Office  PCP:  [name], PA-C  Cardiologist:  No primary care provider on file.  Electrophysiologist:  None   Evaluation Performed:  Follow-Up Visit  Chief Complaint:  DOE  History of Present Illness:    Pamela Lowe is a 69 y.o. female with hx of hyperlipidemia, hypertension who is referred by [name], PA-C for evaluation of dyspnea.  Still having DOE. She has occasional chest pain. Despite walking regularly, dyspnea is still limiting. No orthopnea, PND, or edema.  Has chronic inflammatory disease.  The patient does not have symptoms concerning for COVID-19 infection (fever, chills, cough, or new shortness of breath).    Past Medical History:  Diagnosis Date  . Allergic rhinitis   . Breast cyst 2007   Right  . Cataract    surgery 07/2017  . Depression 2006   w/ menopause  . Family history of breast cancer   . Family history of colon cancer   . Family history of kidney cancer 01/15/2018  . Family history of lung cancer   . Family history of pancreatic cancer   . Family history of prostate cancer   . Family history of uterine cancer   . GERD (gastroesophageal reflux disease)   .  Glaucoma   . History of hysterectomy, supracervical   . Hyperlipidemia   . Hypertension   . 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   MLH1 [id number] (Splice site)  . Multinodular goiter   . Tobacco user    Past Surgical History:  Procedure Laterality Date  . BREAST BIOPSY Bilateral 1985  . CATARACT EXTRACTION Right 07/2017  . COLONOSCOPY    . SUPRACERVICAL ABDOMINAL HYSTERECTOMY  1986   h/o supracervical hysterectomy  . UPPER GASTROINTESTINAL ENDOSCOPY       Current Meds  Medication Sig  . aspirin 325 MG tablet Take 1 tablet by mouth daily.  . benazepril (LOTENSIN) 20 MG tablet Take 1 tablet (20 mg total) by mouth daily.  . brimonidine-timolol (COMBIGAN) 0.2-0.5 % ophthalmic solution Apply 1 drop to eye every 12 (twelve) hours.   . Golimumab (SIMPONI ARIA IV) Inject 12.5 mLs into the vein every 8 (eight) weeks.   . Omega-3 Fatty Acids (FISH OIL PO) Take by mouth daily.  [name] omeprazole (PRILOSEC) 20 MG capsule Take 20 mg by mouth 2 (two) times daily. As needed  . prednisoLONE acetate (PRED FORTE) 1 % ophthalmic suspension Place 1 drop into both eyes 2 (two) times a day.   . predniSONE (DELTASONE) 10 MG tablet Take 7.5 mg by mouth daily.   . Probiotic Product (ALIGN) 4 MG CAPS daily.  . rosuvastatin (CRESTOR) 20 MG tablet Take 20 mg by mouth daily.     Allergies:   Bactrim [sulfamethoxazole-trimethoprim] and Wellbutrin [  bupropion hcl]   Social History   Tobacco Use  . Smoking status: Former Smoker    Packs/day: 0.25    Years: 30.00    Pack years: 7.50    Types: Cigarettes    Last attempt to quit: 08/01/2017    Years since quitting: 0.9  . Smokeless tobacco: Never Used  . Tobacco comment: using nicoderm patches  Substance Use Topics  . Alcohol use: Not Currently  . Drug use: No     Family Hx: The patient's family history includes Allergies in her brother; Alzheimer's disease in her mother; Brain cancer in her paternal aunt; Breast cancer (age of onset: 46) in her  sister; Breast cancer (age of onset: 10) in her sister; Breast cancer (age of onset: 21) in her sister; Breast cancer (age of onset: 40) in her cousin; Breast cancer (age of onset: 56) in her maternal aunt; Clotting disorder in her sister; Colon cancer (age of onset: 71) in her paternal grandfather; Deep vein thrombosis in her mother and sister; Heart attack in her maternal grandfather; Heart attack (age of onset: 36) in her brother; Hypertension in her father and mother; Kidney cancer (age of onset: 81) in her cousin; Kidney cancer (age of onset: 74) in her father; Kidney cancer (age of onset: 60) in her cousin; Lung cancer (age of onset: 74) in her paternal uncle; Lung cancer (age of onset: 51) in her maternal uncle; Osteopenia in her mother; Pancreatic cancer (age of onset: 26) in her paternal uncle; Pancreatic cancer (age of onset: 38) in her paternal aunt; Pancreatic cancer (age of onset: 42) in her paternal aunt; Pneumonia in her father; Prostate cancer (age of onset: 26) in her cousin; Prostate cancer (age of onset: 41) in her maternal uncle; Uterine cancer (age of onset: 46) in her cousin; Uterine cancer (age of onset: 14) in her paternal grandmother. There is no history of Thyroid disease.  ROS:   Please see the history of present illness.    Anxiety about overall health, weight, and risk of future problems. Review of prior w/u at [facility] documented PAD, Carotid plaque, elevated lipids, and hypertension. Stress test within 2 years was negative for ischemia All other systems reviewed and are negative.   Prior CV studies:   The following studies were reviewed today:  ECHOCARDIOGRAM 05/27/2018: IMPRESSIONS    1. The left ventricle has normal systolic function with an ejection fraction of 60-65%. The cavity size was normal. There is mildly increased left ventricular wall thickness. Left ventricular diastolic Doppler parameters are consistent with impaired  relaxation.  2. The right ventricle  has normal systolic function. The cavity was normal. There is no increase in right ventricular wall thickness.  3. There is mild mitral annular calcification present.  4. Mild thickening of the aortic valve Mild calcification of the aortic valve.  5. There is mild dilatation of the ascending aorta measuring 39 mm.  Labs/Other Tests and Data Reviewed:    EKG:  No ECG reviewed.  Recent Labs: 09/23/2017: BUN 14; Creatinine, Ser 1.10; Hemoglobin 15.3; Platelets 315; Potassium 3.7; Sodium 140 05/22/2018: NT-Pro BNP 37   Recent Lipid Panel No results found for: CHOL, TRIG, HDL, CHOLHDL, LDLCALC, LDLDIRECT  Wt Readings from Last 3 Encounters:  07/23/18 225 lb (102.1 kg)  06/09/18 229 lb (103.9 kg)  05/22/18 224 lb 12.8 oz (102 kg)     Objective:    Vital Signs:  BP (!) 130/95   Pulse 75   Ht _0  (1.676 m)  Wt 225 lb (102.1 kg)   LMP 03/19/1998 Comment: h/o supracervical hysterectomy  BMI 36.32 kg/m    VITAL SIGNS:  reviewed GEN:  no acute distress RESPIRATORY:  normal respiratory effort, symmetric expansion CARDIOVASCULAR:  no peripheral edema MUSCULOSKELETAL:  no obvious deformities.  ASSESSMENT & PLAN:    1. DOE (dyspnea on exertion)   2. Essential hypertension   3. Mixed hyperlipidemia   4. PAD (peripheral artery disease) ([place])   5. 2019 novel coronavirus disease (COVID-19)   6. SOB (shortness of breath)   7. Chest pain, unspecified type   8. Precordial pain    PLAN:  1. Etiology must be multifactorial. Cannot exclude CAD given episodes of pain. Plan CT cor angio with ffr. 2. Possibly related to CAD given h/o PAD. Cor CT and ffr as noted above. 3. Target < 130/80 mm Hg.  Low salt diet and decrease fluid fluid intake to < 1.5 l/day. 4. LDL target < 70. 5. Walking is encouraged.    COVID-19 Education: The signs and symptoms of COVID-19 were discussed with the patient and how to seek care for testing (follow up with PCP or arrange E-visit).  The importance of social  distancing was discussed today.  Time:   Today, I have spent 18 minutes with the patient with telehealth technology discussing the above problems.     Medication Adjustments/Labs and Tests Ordered: Current medicines are reviewed at length with the patient today.  Concerns regarding medicines are outlined above.   Tests Ordered: Orders Placed This Encounter  Procedures  . CT CORONARY MORPH W/CTA COR W/SCORE W/CA W/CM &/OR WO/CM  . CT CORONARY FRACTIONAL FLOW RESERVE DATA PREP  . CT CORONARY FRACTIONAL FLOW RESERVE FLUID ANALYSIS  . Basic metabolic panel    Medication Changes: Meds ordered this encounter  Medications  . metoprolol tartrate (LOPRESSOR) 50 MG tablet    Sig: Take one tablet by mouth 2 hours prior to your CT    Dispense:  1 tablet    Refill:  0    Disposition:  Follow up in 3 month(s)  Signed, Sinclair Grooms, MD  07/23/2018 4:41 PM    [facility]  .

## 2018-07-22 NOTE — Telephone Encounter (Signed)
Patient set up for MyChart?  YES SENT CONSENT TO PT  Is patient using Smartphone/computer/tablet? IPHONE  Did audio/video work?   Does patient need telephone visit? NO  Best phone number to use? [phone]  Special Instructions? PATIENT WILL HAVE VITALS AND MEDICATION LIST READY FOR VISIT     Virtual Visit Pre-Appointment Phone Call  "(Name), I am calling you today to discuss your upcoming appointment. We are currently trying to limit exposure to the virus that causes COVID-19 by seeing patients at home rather than in the office."  1. "What is the BEST phone number to call the day of the visit?" - include this in appointment notes  2. Do you have or have access to (through a family member/friend) a smartphone with video capability that we can use for your visit?" a. If yes - list this number in appt notes as cell (if different from BEST phone #) and list the appointment type as a VIDEO visit in appointment notes b. If no - list the appointment type as a PHONE visit in appointment notes  3. Confirm consent - "In the setting of the current Covid19 crisis, you are scheduled for a (phone or video) visit with your provider on (date) at (time).  Just as we do with many in-office visits, in order for you to participate in this visit, we must obtain consent.  If you'd like, I can send this to your mychart (if signed up) or email for you to review.  Otherwise, I can obtain your verbal consent now.  All virtual visits are billed to your insurance company just like a normal visit would be.  By agreeing to a virtual visit, we'd like you to understand that the technology does not allow for your provider to perform an examination, and thus may limit your provider's ability to fully assess your condition. If your provider identifies any concerns that need to be evaluated in person, we will make arrangements to do so.  Finally, though the technology is pretty good, we cannot assure that it will always  work on either your or our end, and in the setting of a video visit, we may have to convert it to a phone-only visit.  In either situation, we cannot ensure that we have a secure connection.  Are you willing to proceed?" STAFF: Did the patient verbally acknowledge consent to telehealth visit? Document YES/NO here: YES  4. Advise patient to be prepared - "Two hours prior to your appointment, go ahead and check your blood pressure, pulse, oxygen saturation, and your weight (if you have the equipment to check those) and write them all down. When your visit starts, your provider will ask you for this information. If you have an Apple Watch or Kardia device, please plan to have heart rate information ready on the day of your appointment. Please have a pen and paper handy nearby the day of the visit as well."  5. Give patient instructions for MyChart download to smartphone OR Doximity/Doxy.me as below if video visit (depending on what platform provider is using)  6. Inform patient they will receive a phone call 15 minutes prior to their appointment time (may be from unknown caller ID) so they should be prepared to answer    TELEPHONE CALL NOTE  [facility] has been deemed a candidate for a follow-up tele-health visit to limit community exposure during the Covid-19 pandemic. I spoke with the patient via phone to ensure availability of phone/video source, confirm preferred email &  phone number, and discuss instructions and expectations.  I reminded Pamela Lowe to be prepared with any vital sign and/or heart rhythm information that could potentially be obtained via home monitoring, at the time of her visit. I reminded Pamela Lowe to expect a phone call prior to her visit.  Howie Ill 07/22/2018 9:40 AM   INSTRUCTIONS FOR DOWNLOADING THE MYCHART APP TO SMARTPHONE  - The patient must first make sure to have activated MyChart and know their login information - If Apple, go to [employer] and  type in MyChart in the search bar and download the app. If Android, ask patient to go to [employer] and type in [place] in the search bar and download the app. The app is free but as with any other app downloads, their phone may require them to verify saved payment information or Apple/Android password.  - The patient will need to then log into the app with their MyChart username and password, and select [facility] as their healthcare provider to link the account. When it is time for your visit, go to the MyChart app, find appointments, and click Begin Video Visit. Be sure to Select Allow for your device to access the Microphone and Camera for your visit. You will then be connected, and your provider will be with you shortly.  **If they have any issues connecting, or need assistance please contact MyChart service desk [phone])**  **If using a computer, in order to ensure the best quality for their visit they will need to use either of the following Internet Browsers: [employer], or Google Chrome**  IF USING DOXIMITY or DOXY.ME - The patient will receive a link just prior to their visit by text.     FULL LENGTH CONSENT FOR TELE-HEALTH VISIT   I hereby voluntarily request, consent and authorize [facility] and its employed or contracted physicians, physician assistants, nurse practitioners or other licensed health care professionals (the Practitioner), to provide me with telemedicine health care services (the Services") as deemed necessary by the treating Practitioner. I acknowledge and consent to receive the Services by the Practitioner via telemedicine. I understand that the telemedicine visit will involve communicating with the Practitioner through live audiovisual communication technology and the disclosure of certain medical information by electronic transmission. I acknowledge that I have been given the opportunity to request an in-person assessment or other  available alternative prior to the telemedicine visit and am voluntarily participating in the telemedicine visit.  I understand that I have the right to withhold or withdraw my consent to the use of telemedicine in the course of my care at any time, without affecting my right to future care or treatment, and that the Practitioner or I may terminate the telemedicine visit at any time. I understand that I have the right to inspect all information obtained and/or recorded in the course of the telemedicine visit and may receive copies of available information for a reasonable fee.  I understand that some of the potential risks of receiving the Services via telemedicine include:   Delay or interruption in medical evaluation due to technological equipment failure or disruption;  Information transmitted may not be sufficient (e.g. poor resolution of images) to allow for appropriate medical decision making by the Practitioner; and/or   In rare instances, security protocols could fail, causing a breach of personal health information.  Furthermore, I acknowledge that it is my responsibility to provide information about my medical history, conditions and care that  is complete and accurate to the best of my ability. I acknowledge that Practitioner's advice, recommendations, and/or decision may be based on factors not within their control, such as incomplete or inaccurate data provided by me or distortions of diagnostic images or specimens that may result from electronic transmissions. I understand that the practice of medicine is not an exact science and that Practitioner makes no warranties or guarantees regarding treatment outcomes. I acknowledge that I will receive a copy of this consent concurrently upon execution via email to the email address I last provided but may also request a printed copy by calling the office of [facility].    I understand that my insurance will be billed for this visit.   I have  read or had this consent read to me.  I understand the contents of this consent, which adequately explains the benefits and risks of the Services being provided via telemedicine.   I have been provided ample opportunity to ask questions regarding this consent and the Services and have had my questions answered to my satisfaction.  I give my informed consent for the services to be provided through the use of telemedicine in my medical care  By participating in this telemedicine visit I agree to the above.

## 2018-07-23 ENCOUNTER — Encounter: Payer: Self-pay | Admitting: Interventional Cardiology

## 2018-07-23 ENCOUNTER — Telehealth (INDEPENDENT_AMBULATORY_CARE_PROVIDER_SITE_OTHER): Payer: Medicare Other | Admitting: Interventional Cardiology

## 2018-07-23 ENCOUNTER — Other Ambulatory Visit: Payer: Self-pay

## 2018-07-23 VITALS — BP 130/95 | HR 75 | Ht 66.0 in | Wt 225.0 lb

## 2018-07-23 DIAGNOSIS — R079 Chest pain, unspecified: Secondary | ICD-10-CM

## 2018-07-23 DIAGNOSIS — E049 Nontoxic goiter, unspecified: Secondary | ICD-10-CM | POA: Diagnosis not present

## 2018-07-23 DIAGNOSIS — R072 Precordial pain: Secondary | ICD-10-CM

## 2018-07-23 DIAGNOSIS — E782 Mixed hyperlipidemia: Secondary | ICD-10-CM

## 2018-07-23 DIAGNOSIS — R0609 Other forms of dyspnea: Secondary | ICD-10-CM

## 2018-07-23 DIAGNOSIS — I739 Peripheral vascular disease, unspecified: Secondary | ICD-10-CM

## 2018-07-23 DIAGNOSIS — U071 COVID-19: Secondary | ICD-10-CM

## 2018-07-23 DIAGNOSIS — I10 Essential (primary) hypertension: Secondary | ICD-10-CM

## 2018-07-23 DIAGNOSIS — R0600 Dyspnea, unspecified: Secondary | ICD-10-CM

## 2018-07-23 MED ORDER — METOPROLOL TARTRATE 50 MG PO TABS: ORAL_TABLET | ORAL | 0 refills | Status: DC

## 2018-07-23 NOTE — Patient Instructions (Addendum)
Medication Instructions:  Your physician recommends that you continue on your current medications as directed. Please refer to the Current Medication list given to you today.  If you need a refill on your cardiac medications before your next appointment, please call your pharmacy.   Lab work: You will need a kidney function test prior to your CT.  If you have labs (blood work) drawn today and your tests are completely normal, you will receive your results only by: [name] MyChart Message (if you have MyChart) OR . A paper copy in the mail If you have any lab test that is abnormal or we need to change your treatment, we will call you to review the results.  Testing/Procedures: Non-Cardiac CT Angiography (CTA), is a special type of CT scan that uses a computer to produce multi-dimensional views of major blood vessels throughout the body. In CT angiography, a contrast material is injected through an IV to help visualize the blood vessels  Follow-Up: At [facility], you and your health needs are our priority.  As part of our continuing mission to provide you with exceptional heart care, we have created designated Provider Care Teams.  These Care Teams include your primary Cardiologist (physician) and Advanced Practice Providers (APPs -  Physician Assistants and Nurse Practitioners) who all work together to provide you with the care you need, when you need it. You will need a follow up appointment in 4 weeks.  Please call our office 2 months in advance to schedule this appointment.  You may see Dr. Tamala Julian or one of the following Advanced Practice Providers on your designated Care Team:   [name], NP Cecilie Kicks, NP . Kathyrn Drown, NP  Any Other Special Instructions Will Be Listed Below (If Applicable).   Please arrive at the [facility] main entrance of [facility] 30-45 minutes prior to test start time  [facility] [place],  [phone]  Proceed to the [facility] Radiology Department (First Floor).  Please follow these instructions carefully (unless otherwise directed):   On the Night Before the Test: . Be sure to Drink plenty of water. . Do not consume any caffeinated/decaffeinated beverages or chocolate 12 hours prior to your test. . Do not take any antihistamines 12 hours prior to your test. . If you take Metformin do not take 24 hours prior to test.  On the Day of the Test: . Drink plenty of water. Do not drink any water within one hour of the test. . Do not eat any food 4 hours prior to the test. . You may take your regular medications prior to the test.  . Take metoprolol (Lopressor) two hours prior to test. . HOLD Furosemide/Hydrochlorothiazide morning of the test.        After the Test: . Drink plenty of water. . After receiving IV contrast, you may experience a mild flushed feeling. This is normal. . On occasion, you may experience a mild rash up to 24 hours after the test. This is not dangerous. If this occurs, you can take Benadryl 25 mg and increase your fluid intake. . If you experience trouble breathing, this can be serious. If it is severe call 911 IMMEDIATELY. If it is mild, please call our office. . If you take any of these medications: Glipizide/Metformin, Avandament, Glucavance, please do not take 48 hours after completing test.

## 2018-07-23 NOTE — Telephone Encounter (Signed)
New Message   Patient ready for you call please give her a call.

## 2018-07-24 ENCOUNTER — Telehealth: Payer: Medicare Other | Admitting: Interventional Cardiology

## 2018-07-24 NOTE — Addendum Note (Signed)
Addended by: Loren Racer on: 07/24/2018 01:10 PM   Modules accepted: Orders

## 2018-07-30 DIAGNOSIS — H209 Unspecified iridocyclitis: Secondary | ICD-10-CM | POA: Diagnosis not present

## 2018-07-30 DIAGNOSIS — H40023 Open angle with borderline findings, high risk, bilateral: Secondary | ICD-10-CM | POA: Diagnosis not present

## 2018-07-31 ENCOUNTER — Telehealth: Payer: Self-pay | Admitting: Interventional Cardiology

## 2018-07-31 NOTE — Telephone Encounter (Signed)
I spoke to the patient who called inquiring about labs.  I told her that we needed to check kidney function prior to her test.  We made a lab appt 5/15.

## 2018-07-31 NOTE — Telephone Encounter (Signed)
  Patient got a letter about doing blood work and she doesn't understand why she needs it since she had blood work last month. Does she still need to come have it done?

## 2018-08-01 ENCOUNTER — Other Ambulatory Visit: Payer: Medicare Other | Admitting: *Deleted

## 2018-08-01 ENCOUNTER — Other Ambulatory Visit: Payer: Self-pay

## 2018-08-01 DIAGNOSIS — R079 Chest pain, unspecified: Secondary | ICD-10-CM

## 2018-08-01 DIAGNOSIS — R0600 Dyspnea, unspecified: Secondary | ICD-10-CM

## 2018-08-01 DIAGNOSIS — R0609 Other forms of dyspnea: Secondary | ICD-10-CM | POA: Diagnosis not present

## 2018-08-01 LAB — BASIC METABOLIC PANEL WITH GFR
BUN/Creatinine Ratio: 22 (ref 12–28)
BUN: 20 mg/dL (ref 8–27)
CO2: 25 mmol/L (ref 20–29)
Calcium: 9.8 mg/dL (ref 8.7–10.3)
Chloride: 104 mmol/L (ref 96–106)
Creatinine, Ser: 0.9 mg/dL (ref 0.57–1.00)
GFR calc Af Amer: 77 mL/min/1.73 (ref >59)
GFR calc non Af Amer: 66 mL/min/1.73 (ref >59)
Glucose: 97 mg/dL (ref 65–99)
Potassium: 3.9 mmol/L (ref 3.5–5.2)
Sodium: 142 mmol/L (ref 134–144)

## 2018-08-06 DIAGNOSIS — Z961 Presence of intraocular lens: Secondary | ICD-10-CM | POA: Diagnosis not present

## 2018-08-06 DIAGNOSIS — H209 Unspecified iridocyclitis: Secondary | ICD-10-CM | POA: Diagnosis not present

## 2018-08-06 DIAGNOSIS — H40043 Steroid responder, bilateral: Secondary | ICD-10-CM | POA: Diagnosis not present

## 2018-08-06 DIAGNOSIS — Z5181 Encounter for therapeutic drug level monitoring: Secondary | ICD-10-CM | POA: Diagnosis not present

## 2018-08-08 ENCOUNTER — Telehealth: Payer: Self-pay

## 2018-08-08 NOTE — Telephone Encounter (Signed)
YOUR CARDIOLOGY TEAM HAS ARRANGED FOR AN E-VISIT FOR YOUR APPOINTMENT - PLEASE REVIEW IMPORTANT INFORMATION BELOW SEVERAL DAYS PRIOR TO YOUR APPOINTMENT  Due to the recent COVID-19 pandemic, we are transitioning in-person office visits to tele-medicine visits in an effort to decrease unnecessary exposure to our patients, their families, and staff. These visits are billed to your insurance just like a normal visit is. We also encourage you to sign up for MyChart if you have not already done so. You will need a smartphone if possible. For patients that do not have this, we can still complete the visit using a regular telephone but do prefer a smartphone to enable video when possible. You may have a family member that lives with you that can help. If possible, we also ask that you have a blood pressure cuff and scale at home to measure your blood pressure, heart rate and weight prior to your scheduled appointment. Patients with clinical needs that need an in-person evaluation and testing will still be able to come to the office if absolutely necessary. If you have any questions, feel free to call our office.     YOUR PROVIDER WILL BE USING THE FOLLOWING PLATFORM TO COMPLETE YOUR VISIT: Doximity  . IF USING MYCHART - How to Download the MyChart App to Your SmartPhone   - If Apple, go to App Store and type in MyChart in the search bar and download the app. If Android, ask patient to go to Google Play Store and type in MyChart in the search bar and download the app. The app is free but as with any other app downloads, your phone may require you to verify saved payment information or Apple/Android password.  - You will need to then log into the app with your MyChart username and password, and select [facility] as your healthcare provider to link the account.  - When it is time for your visit, go to the MyChart app, find appointments, and click Begin Video Visit. Be sure to Select Allow for your device to  access the Microphone and Camera for your visit. You will then be connected, and your provider will be with you shortly.  **If you have any issues connecting or need assistance, please contact MyChart service desk [phone] ([phone])**  **If using a computer, in order to ensure the best quality for your visit, you will need to use either of the following Internet Browsers: Google Chrome or Microsoft Edge**  . IF USING DOXIMITY or DOXY.ME - The staff will give you instructions on receiving your link to join the meeting the day of your visit.      2-3 DAYS BEFORE YOUR APPOINTMENT  You will receive a telephone call from one of our [facility] team members - your caller ID may say "Unknown caller." If this is a video visit, we will walk you through how to get the video launched on your phone. We will remind you check your blood pressure, heart rate and weight prior to your scheduled appointment. If you have an Apple Watch or Kardia, please upload any pertinent ECG strips the day before or morning of your appointment to MyChart. Our staff will also make sure you have reviewed the consent and agree to move forward with your scheduled tele-health visit.     THE DAY OF YOUR APPOINTMENT  Approximately 15 minutes prior to your scheduled appointment, you will receive a telephone call from one of Heart[facility] team - your caller ID may say "Unknown caller."    Our staff will confirm medications, vital signs for the day and any symptoms you may be experiencing. Please have this information available prior to the time of visit start. It may also be helpful for you to have a pad of paper and pen handy for any instructions given during your visit. They will also walk you through joining the smartphone meeting if this is a video visit.    CONSENT FOR TELE-HEALTH VISIT - PLEASE REVIEW  I hereby voluntarily request, consent and authorize [facility] and its employed or contracted physicians, physician  assistants, nurse practitioners or other licensed health care professionals (the Practitioner), to provide me with telemedicine health care services (the "Services") as deemed necessary by the treating Practitioner. I acknowledge and consent to receive the Services by the Practitioner via telemedicine. I understand that the telemedicine visit will involve communicating with the Practitioner through live audiovisual communication technology and the disclosure of certain medical information by electronic transmission. I acknowledge that I have been given the opportunity to request an in-person assessment or other available alternative prior to the telemedicine visit and am voluntarily participating in the telemedicine visit.  I understand that I have the right to withhold or withdraw my consent to the use of telemedicine in the course of my care at any time, without affecting my right to future care or treatment, and that the Practitioner or I may terminate the telemedicine visit at any time. I understand that I have the right to inspect all information obtained and/or recorded in the course of the telemedicine visit and may receive copies of available information for a reasonable fee.  I understand that some of the potential risks of receiving the Services via telemedicine include:  . Delay or interruption in medical evaluation due to technological equipment failure or disruption; . Information transmitted may not be sufficient (e.g. poor resolution of images) to allow for appropriate medical decision making by the Practitioner; and/or  . In rare instances, security protocols could fail, causing a breach of personal health information.  Furthermore, I acknowledge that it is my responsibility to provide information about my medical history, conditions and care that is complete and accurate to the best of my ability. I acknowledge that Practitioner's advice, recommendations, and/or decision may be based on  factors not within their control, such as incomplete or inaccurate data provided by me or distortions of diagnostic images or specimens that may result from electronic transmissions. I understand that the practice of medicine is not an exact science and that Practitioner makes no warranties or guarantees regarding treatment outcomes. I acknowledge that I will receive a copy of this consent concurrently upon execution via email to the email address I last provided but may also request a printed copy by calling the office of [facility].    I understand that my insurance will be billed for this visit.   I have read or had this consent read to me. . I understand the contents of this consent, which adequately explains the benefits and risks of the Services being provided via telemedicine.  . I have been provided ample opportunity to ask questions regarding this consent and the Services and have had my questions answered to my satisfaction. . I give my informed consent for the services to be provided through the use of telemedicine in my medical care  By participating in this telemedicine visit I agree to the above.  

## 2018-08-18 ENCOUNTER — Telehealth (HOSPITAL_COMMUNITY): Payer: Self-pay | Admitting: Emergency Medicine

## 2018-08-18 NOTE — Telephone Encounter (Signed)
Reaching out to patient to offer assistance regarding upcoming cardiac imaging study; pt verbalizes understanding of appt date/time, parking situation and where to check in, pre-test NPO status and medications ordered, and verified current allergies; name and call back number provided for further questions should they arise Marchia Bond RN [facility] [phone] office [phone] cell  Pt denies COVID symptoms, verbalized understanding of visitor policy

## 2018-08-19 ENCOUNTER — Other Ambulatory Visit: Payer: Self-pay

## 2018-08-19 ENCOUNTER — Ambulatory Visit (HOSPITAL_COMMUNITY)
Admission: RE | Admit: 2018-08-19 | Discharge: 2018-08-19 | Disposition: A | Discharge status: Home / Self Care | Payer: Medicare Other | Source: Ambulatory Visit | Attending: Interventional Cardiology | Admitting: Interventional Cardiology

## 2018-08-19 ENCOUNTER — Encounter: Payer: Self-pay | Admitting: Interventional Cardiology

## 2018-08-19 DIAGNOSIS — R072 Precordial pain: Secondary | ICD-10-CM

## 2018-08-19 DIAGNOSIS — I700 Atherosclerosis of aorta: Secondary | ICD-10-CM | POA: Insufficient documentation

## 2018-08-19 MED ORDER — NITROGLYCERIN 0.4 MG SL SUBL
SUBLINGUAL_TABLET | SUBLINGUAL | Status: AC
  Filled 2018-08-19: qty 2

## 2018-08-19 MED ORDER — IOHEXOL 350 MG/ML SOLN
80.0000 mL | Freq: Once | INTRAVENOUS | Status: AC | PRN
  Administered 2018-08-19: 80 mL via INTRAVENOUS

## 2018-08-19 MED ORDER — NITROGLYCERIN 0.4 MG SL SUBL
0.8000 mg | SUBLINGUAL_TABLET | Freq: Once | SUBLINGUAL | Status: AC
  Administered 2018-08-19: 0.8 mg via SUBLINGUAL

## 2018-08-19 NOTE — Progress Notes (Signed)
Pt ambulatory out of department with steady gait noted. Denies any complaints.

## 2018-08-19 NOTE — Progress Notes (Signed)
CT complete. Patient denies any complaints. Offered patient snack and beverage.

## 2018-08-20 DIAGNOSIS — I2510 Atherosclerotic heart disease of native coronary artery without angina pectoris: Secondary | ICD-10-CM | POA: Diagnosis not present

## 2018-08-20 NOTE — Progress Notes (Signed)
Virtual Visit via Video Note   This visit type was conducted due to national recommendations for restrictions regarding the COVID-19 Pandemic (e.g. social distancing) in an effort to limit this patient's exposure and mitigate transmission in our community.  Due to her co-morbid illnesses, this patient is at least at moderate risk for complications without adequate follow up.  This format is felt to be most appropriate for this patient at this time.  All issues noted in this document were discussed and addressed.  A limited physical exam was performed with this format.  Please refer to the patient's chart for her consent to telehealth for [facility].   Date:  08/21/2018   ID:  Pamela Lowe, DOB [birth year], MRN [id number]  Patient Location: Home Provider Location: Home  PCP:  [name], PA-C  Cardiologist:  No primary care provider on file.  Electrophysiologist:  None   Evaluation Performed:  Follow-Up Visit  Chief Complaint:  CAD/PAD  History of Present Illness:    Pamela Lowe is a 68 y.o. female with  Shortness of breath, Chest Pain, hyperlipidemia, PAD, and hypertension.  Cor CTA is positive for LAD disease, borderline severe. Dyspnea has improved. No prolonged chest pain. LDL 91; and total Chol 198, TG 82 (05/17/27).  Overall, feels better.  Breathing has been less of an issue.  There is been no recurrence of chest discomfort.  No lower extremity swelling.  The patient does not have symptoms concerning for COVID-19 infection (fever, chills, cough, or new shortness of breath).    Past Medical History:  Diagnosis Date  . Allergic rhinitis   . Breast cyst 2007   Right  . Cataract    surgery 07/2017  . Depression 2006   w/ menopause  . Family history of breast cancer   . Family history of colon cancer   . Family history of kidney cancer 01/15/2018  . Family history of lung cancer   . Family history of pancreatic cancer   . Family history of prostate cancer    . Family history of uterine cancer   . GERD (gastroesophageal reflux disease)   . Glaucoma   . History of hysterectomy, supracervical   . Hyperlipidemia   . Hypertension   . 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   MLH1 [id number] (Splice site)  . Multinodular goiter   . Tobacco user    Past Surgical History:  Procedure Laterality Date  . BREAST BIOPSY Bilateral 1985  . CATARACT EXTRACTION Right 07/2017  . COLONOSCOPY    . SUPRACERVICAL ABDOMINAL HYSTERECTOMY  1986   h/o supracervical hysterectomy  . UPPER GASTROINTESTINAL ENDOSCOPY       Current Meds  Medication Sig  . aspirin 325 MG tablet Take 1 tablet by mouth daily.  . benazepril (LOTENSIN) 20 MG tablet Take 1 tablet (20 mg total) by mouth daily.  . brimonidine-timolol (COMBIGAN) 0.2-0.5 % ophthalmic solution Apply 1 drop to eye every 12 (twelve) hours.   . Golimumab (SIMPONI ARIA IV) Inject 12.5 mLs into the vein every 8 (eight) weeks.   . Omega-3 Fatty Acids (FISH OIL PO) Take by mouth daily.  [name] omeprazole (PRILOSEC) 20 MG capsule Take 20 mg by mouth 2 (two) times daily. As needed  . prednisoLONE acetate (PRED FORTE) 1 % ophthalmic suspension Place 1 drop into both eyes 4 (four) times daily.   . predniSONE (DELTASONE) 10 MG tablet Take 5 mg by mouth daily.   . Probiotic Product (ALIGN) 4 MG CAPS daily.  . rosuvastatin (CRESTOR)  20 MG tablet Take 20 mg by mouth daily.     Allergies:   Bactrim [sulfamethoxazole-trimethoprim] and Wellbutrin [bupropion hcl]   Social History   Tobacco Use  . Smoking status: Former Smoker    Packs/day: 0.25    Years: 30.00    Pack years: 7.50    Types: Cigarettes    Last attempt to quit: 08/01/2017    Years since quitting: 1.0  . Smokeless tobacco: Never Used  . Tobacco comment: using nicoderm patches  Substance Use Topics  . Alcohol use: Not Currently  . Drug use: No     Family Hx: The patient's family history includes Allergies in her brother; Alzheimer's disease in her  mother; Brain cancer in her paternal aunt; Breast cancer (age of onset: 76) in her sister; Breast cancer (age of onset: 103) in her sister; Breast cancer (age of onset: 33) in her sister; Breast cancer (age of onset: 20) in her cousin; Breast cancer (age of onset: 31) in her maternal aunt; Clotting disorder in her sister; Colon cancer (age of onset: 67) in her paternal grandfather; Deep vein thrombosis in her mother and sister; Heart attack in her maternal grandfather; Heart attack (age of onset: 23) in her brother; Hypertension in her father and mother; Kidney cancer (age of onset: 59) in her cousin; Kidney cancer (age of onset: 76) in her father; Kidney cancer (age of onset: 53) in her cousin; Lung cancer (age of onset: 23) in her paternal uncle; Lung cancer (age of onset: 83) in her maternal uncle; Osteopenia in her mother; Pancreatic cancer (age of onset: 69) in her paternal uncle; Pancreatic cancer (age of onset: 52) in her paternal aunt; Pancreatic cancer (age of onset: 58) in her paternal aunt; Pneumonia in her father; Prostate cancer (age of onset: 65) in her cousin; Prostate cancer (age of onset: 14) in her maternal uncle; Uterine cancer (age of onset: 90) in her cousin; Uterine cancer (age of onset: 72) in her paternal grandmother. There is no history of Thyroid disease.  ROS:   Please see the history of present illness.    Feels she has indigestion.  Presenting chest pain episode was a 3-day continuous discomfort with negative cardiac markers in the emergency room.  Normal EKG. All other systems reviewed and are negative.   Prior CV studies:   The following studies were reviewed today:  Coronary CTA Coronary :08/2018 IMPRESSION: 1. Coronary calcium score of 204. This was 3 percentile for age and sex matched control.  2. Normal coronary origin with right dominance.  3. Suspicion for a severe stenosis in the mid LAD, additional analysis with CT FFR will be submitted.  ECHOCARDIOGRAM  2020: IMPRESSIONS    1. The left ventricle has normal systolic function with an ejection fraction of 60-65%. The cavity size was normal. There is mildly increased left ventricular wall thickness. Left ventricular diastolic Doppler parameters are consistent with impaired  relaxation.  2. The right ventricle has normal systolic function. The cavity was normal. There is no increase in right ventricular wall thickness.  3. There is mild mitral annular calcification present.  4. Mild thickening of the aortic valve Mild calcification of the aortic valve.  5. There is mild dilatation of the ascending aorta measuring 39 mm.   Labs/Other Tests and Data Reviewed:    EKG:  No ECG reviewed.  Recent Labs: 09/23/2017: Hemoglobin 15.3; Platelets 315 05/22/2018: NT-Pro BNP 37 08/01/2018: BUN 20; Creatinine, Ser 0.90; Potassium 3.9; Sodium 142   Recent Lipid  Panel No results found for: CHOL, TRIG, HDL, CHOLHDL, LDLCALC, LDLDIRECT  Wt Readings from Last 3 Encounters:  08/21/18 223 lb (101.2 kg)  07/23/18 225 lb (102.1 kg)  06/09/18 229 lb (103.9 kg)     Objective:    Vital Signs:  BP 137/86   Pulse 63   Ht 5' 6"  (1.676 m)   Wt 223 lb (101.2 kg)   LMP 03/19/1998 Comment: h/o supracervical hysterectomy  BMI 35.99 kg/m   Obese. VITAL SIGNS:  reviewed GEN:  no acute distress RESPIRATORY:  normal respiratory effort, symmetric expansion CARDIOVASCULAR:  no peripheral edema NEURO:  alert and oriented x 3, no obvious focal deficit  ASSESSMENT & PLAN:    1. 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 Essential hypertension   3. Mixed hyperlipidemia   4. PAD (peripheral artery disease) ([place])   5. DOE (dyspnea on exertion)   6. 2019 novel coronavirus disease (COVID-19)    PLAN:  1. No recurrence of chest pain.  Work-up has documented moderate to severe mid to distal LAD disease.  Add beta-blocker therapy and intensify statin therapy.   Sublingual nitroglycerin if recurrent chest pain.  Clinical follow-up in office in 4 to 6 weeks to make decision about whether invasive angiography is indicated.  Patient has been symptomatic off and on for greater than 12 months.  She currently feels better than before. 2. Target 130/80 mmHg 3. Most recent LDL was 92.  Increase Crestor to 40 mg/day. 4. Not addressed 5. DOE either deconditioning or ischemic equivalent.  She has normal LV systolic function.  She does have some abnormal diastolic parameters.  Overall education and awareness concerning primary/secondary risk prevention was discussed in detail: LDL less than 70, hemoglobin A1c less than 7, blood pressure target less than 130/80 mmHg, >150 minutes of moderate aerobic activity per week, avoidance of smoking, weight control (via diet and exercise), and continued surveillance/management of/for obstructive sleep apnea.  COVID-19 Education: The signs and symptoms of COVID-19 were discussed with the patient and how to seek care for testing (follow up with PCP or arrange E-visit).  The importance of social distancing was discussed today.  Time:   Today, I have spent 15 minutes with the patient with telehealth technology discussing the above problems.     Medication Adjustments/Labs and Tests Ordered: Current medicines are reviewed at length with the patient today.  Concerns regarding medicines are outlined above.   Tests Ordered: No orders of the defined types were placed in this encounter.   Medication Changes: No orders of the defined types were placed in this encounter.   Disposition:  Follow up in 2 month(s)  Signed, Sinclair Grooms, MD  08/21/2018 2:12 PM    [facility]

## 2018-08-21 ENCOUNTER — Telehealth (INDEPENDENT_AMBULATORY_CARE_PROVIDER_SITE_OTHER): Payer: Medicare Other | Admitting: Interventional Cardiology

## 2018-08-21 ENCOUNTER — Encounter: Payer: Self-pay | Admitting: Interventional Cardiology

## 2018-08-21 ENCOUNTER — Other Ambulatory Visit: Payer: Self-pay

## 2018-08-21 VITALS — BP 137/86 | HR 63 | Ht 66.0 in | Wt 223.0 lb

## 2018-08-21 DIAGNOSIS — I739 Peripheral vascular disease, unspecified: Secondary | ICD-10-CM

## 2018-08-21 DIAGNOSIS — R0609 Other forms of dyspnea: Secondary | ICD-10-CM

## 2018-08-21 DIAGNOSIS — I10 Essential (primary) hypertension: Secondary | ICD-10-CM

## 2018-08-21 DIAGNOSIS — I25758 Atherosclerosis of native coronary artery of transplanted heart with other forms of angina pectoris: Secondary | ICD-10-CM

## 2018-08-21 DIAGNOSIS — E782 Mixed hyperlipidemia: Secondary | ICD-10-CM

## 2018-08-21 DIAGNOSIS — R0600 Dyspnea, unspecified: Secondary | ICD-10-CM

## 2018-08-21 DIAGNOSIS — U071 COVID-19: Secondary | ICD-10-CM

## 2018-08-21 MED ORDER — METOPROLOL SUCCINATE ER 25 MG PO TB24: 25.0000 mg | ORAL_TABLET | Freq: Every day | ORAL | 3 refills | Status: DC

## 2018-08-21 MED ORDER — ROSUVASTATIN CALCIUM 40 MG PO TABS: 40.0000 mg | ORAL_TABLET | Freq: Every day | ORAL | 3 refills | Status: DC

## 2018-08-21 MED ORDER — NITROGLYCERIN 0.4 MG SL SUBL: 0.4000 mg | SUBLINGUAL_TABLET | SUBLINGUAL | 3 refills | Status: DC | PRN

## 2018-08-21 NOTE — Patient Instructions (Signed)
Medication Instructions:  1) INCREASE Rosuvastatin to 40mg  once daily 2) START Metoprolol Succinate 25mg  once daily 3)  A prescription has been sent in for Nitroglycerin.  If you have chest pain that doesn't relieve quickly, place one tablet under your tongue and allow it to dissolve.  If no relief after 5 minutes, you may take another pill.  If no relief after 5 minutes, you may take a 3rd dose but you need to call 911 and report to ER immediately.  If you need a refill on your cardiac medications before your next appointment, please call your pharmacy.   Lab work: Your physician recommends that you return for lab work in: 2 months.  You will need to be fasting for these labs (nothing to eat or drink after midnight except water and black coffee)  If you have labs (blood work) drawn today and your tests are completely normal, you will receive your results only by: [name] MyChart Message (if you have MyChart) OR . A paper copy in the mail If you have any lab test that is abnormal or we need to change your treatment, we will call you to review the results.  Testing/Procedures: None  Follow-Up: At [facility], you and your health needs are our priority.  As part of our continuing mission to provide you with exceptional heart care, we have created designated Provider Care Teams.  These Care Teams include your primary Cardiologist (physician) and Advanced Practice Providers (APPs -  Physician Assistants and Nurse Practitioners) who all work together to provide you with the care you need, when you need it. You will need a follow up appointment in 2 months.  Please call our office 2 months in advance to schedule this appointment.  You may see Dr. Tamala Julian or one of the following Advanced Practice Providers on your designated Care Team:   [name], NP Cecilie Kicks, NP . Kathyrn Drown, NP  Any Other Special Instructions Will Be Listed Below (If Applicable).

## 2018-09-02 ENCOUNTER — Other Ambulatory Visit: Payer: Self-pay | Admitting: Endocrinology

## 2018-09-02 DIAGNOSIS — E049 Nontoxic goiter, unspecified: Secondary | ICD-10-CM

## 2018-09-11 DIAGNOSIS — H209 Unspecified iridocyclitis: Secondary | ICD-10-CM | POA: Diagnosis not present

## 2018-09-11 DIAGNOSIS — R768 Other specified abnormal immunological findings in serum: Secondary | ICD-10-CM | POA: Diagnosis not present

## 2018-09-11 DIAGNOSIS — Z79899 Other long term (current) drug therapy: Secondary | ICD-10-CM | POA: Diagnosis not present

## 2018-09-11 DIAGNOSIS — M2550 Pain in unspecified joint: Secondary | ICD-10-CM | POA: Diagnosis not present

## 2018-09-11 DIAGNOSIS — M0589 Other rheumatoid arthritis with rheumatoid factor of multiple sites: Secondary | ICD-10-CM | POA: Diagnosis not present

## 2018-09-12 DIAGNOSIS — M0589 Other rheumatoid arthritis with rheumatoid factor of multiple sites: Secondary | ICD-10-CM | POA: Diagnosis not present

## 2018-09-15 ENCOUNTER — Ambulatory Visit
Admission: RE | Admit: 2018-09-15 | Discharge: 2018-09-15 | Disposition: A | Discharge status: Home / Self Care | Payer: Medicare Other | Source: Ambulatory Visit | Attending: Endocrinology | Admitting: Endocrinology

## 2018-09-15 DIAGNOSIS — E042 Nontoxic multinodular goiter: Secondary | ICD-10-CM | POA: Diagnosis not present

## 2018-09-15 DIAGNOSIS — E049 Nontoxic goiter, unspecified: Secondary | ICD-10-CM

## 2018-09-24 DIAGNOSIS — E559 Vitamin D deficiency, unspecified: Secondary | ICD-10-CM | POA: Diagnosis not present

## 2018-09-24 DIAGNOSIS — E0590 Thyrotoxicosis, unspecified without thyrotoxic crisis or storm: Secondary | ICD-10-CM | POA: Diagnosis not present

## 2018-09-24 DIAGNOSIS — E049 Nontoxic goiter, unspecified: Secondary | ICD-10-CM | POA: Diagnosis not present

## 2018-09-24 DIAGNOSIS — I10 Essential (primary) hypertension: Secondary | ICD-10-CM | POA: Diagnosis not present

## 2018-10-01 ENCOUNTER — Other Ambulatory Visit: Payer: Self-pay | Admitting: Endocrinology

## 2018-10-01 DIAGNOSIS — E049 Nontoxic goiter, unspecified: Secondary | ICD-10-CM

## 2018-10-01 DIAGNOSIS — H40043 Steroid responder, bilateral: Secondary | ICD-10-CM | POA: Diagnosis not present

## 2018-10-01 DIAGNOSIS — H20023 Recurrent acute iridocyclitis, bilateral: Secondary | ICD-10-CM | POA: Diagnosis not present

## 2018-10-01 DIAGNOSIS — H401134 Primary open-angle glaucoma, bilateral, indeterminate stage: Secondary | ICD-10-CM | POA: Diagnosis not present

## 2018-10-02 DIAGNOSIS — H4043X2 Glaucoma secondary to eye inflammation, bilateral, moderate stage: Secondary | ICD-10-CM | POA: Diagnosis not present

## 2018-10-02 DIAGNOSIS — H209 Unspecified iridocyclitis: Secondary | ICD-10-CM | POA: Diagnosis not present

## 2018-10-08 ENCOUNTER — Other Ambulatory Visit: Payer: Self-pay | Admitting: Podiatry

## 2018-10-08 ENCOUNTER — Other Ambulatory Visit: Payer: Self-pay

## 2018-10-08 ENCOUNTER — Encounter: Payer: Self-pay | Admitting: Podiatry

## 2018-10-08 ENCOUNTER — Ambulatory Visit (INDEPENDENT_AMBULATORY_CARE_PROVIDER_SITE_OTHER): Payer: Medicare Other | Admitting: Podiatry

## 2018-10-08 ENCOUNTER — Ambulatory Visit (INDEPENDENT_AMBULATORY_CARE_PROVIDER_SITE_OTHER): Payer: Medicare Other

## 2018-10-08 VITALS — Temp 97.9°F

## 2018-10-08 DIAGNOSIS — M779 Enthesopathy, unspecified: Secondary | ICD-10-CM | POA: Diagnosis not present

## 2018-10-08 DIAGNOSIS — I25758 Atherosclerosis of native coronary artery of transplanted heart with other forms of angina pectoris: Secondary | ICD-10-CM

## 2018-10-08 DIAGNOSIS — M84375A Stress fracture, left foot, initial encounter for fracture: Secondary | ICD-10-CM

## 2018-10-08 DIAGNOSIS — M79672 Pain in left foot: Secondary | ICD-10-CM

## 2018-10-08 DIAGNOSIS — M778 Other enthesopathies, not elsewhere classified: Secondary | ICD-10-CM

## 2018-10-08 DIAGNOSIS — M21619 Bunion of unspecified foot: Secondary | ICD-10-CM

## 2018-10-08 NOTE — Progress Notes (Signed)
Subjective:   Patient ID: Pamela Lowe, female   DOB: 68 y.o.   MRN: [id number]   HPI Patient states she is developed discomfort in the outside of the left ankle and the dorsum of the left foot and states it radiates.  Does not remember specific injury and has been going on for a while.  States it is hard to walk on and difficult to wear shoes   ROS      Objective:  Physical Exam  Neurovascular status intact with patient found to have discomfort in the dorsal lateral aspect of the left foot with inflammation of the midfoot also noted.  States that she has trouble with any form of ambulation currently     Assessment:  Dorsal tendinitis left with possibility for stress fracture and other bone injury     Plan:  H&P x-ray reviewed and today I went ahead and I did dorsal tendon injection 3 mg Kenalog 5 mg Xylocaine and then because of the stress on the bone I did apply air fracture walker and patient will be seen back in the next 3 weeks.  Instructed on reduced activity  X-ray indicates she does have some arthritis and spurring but I did not see active fracture but was not able to rule out stress fracture at this time

## 2018-10-09 ENCOUNTER — Other Ambulatory Visit: Payer: Self-pay

## 2018-10-09 ENCOUNTER — Encounter (HOSPITAL_COMMUNITY)
Admission: RE | Admit: 2018-10-09 | Discharge: 2018-10-09 | Disposition: A | Discharge status: Home / Self Care | Payer: Medicare Other | Source: Ambulatory Visit | Attending: Endocrinology | Admitting: Endocrinology

## 2018-10-09 DIAGNOSIS — E041 Nontoxic single thyroid nodule: Secondary | ICD-10-CM | POA: Diagnosis not present

## 2018-10-09 DIAGNOSIS — E049 Nontoxic goiter, unspecified: Secondary | ICD-10-CM

## 2018-10-09 MED ORDER — SODIUM PERTECHNETATE TC 99M INJECTION
10.3000 | Freq: Once | INTRAVENOUS | Status: AC | PRN
  Administered 2018-10-09: 13:00:00 10.3 via INTRAVENOUS

## 2018-10-13 DIAGNOSIS — M0589 Other rheumatoid arthritis with rheumatoid factor of multiple sites: Secondary | ICD-10-CM | POA: Diagnosis not present

## 2018-10-14 DIAGNOSIS — H209 Unspecified iridocyclitis: Secondary | ICD-10-CM | POA: Diagnosis not present

## 2018-10-14 DIAGNOSIS — H4043X2 Glaucoma secondary to eye inflammation, bilateral, moderate stage: Secondary | ICD-10-CM | POA: Diagnosis not present

## 2018-10-14 DIAGNOSIS — Z961 Presence of intraocular lens: Secondary | ICD-10-CM | POA: Diagnosis not present

## 2018-10-20 ENCOUNTER — Other Ambulatory Visit: Payer: Self-pay

## 2018-10-20 ENCOUNTER — Other Ambulatory Visit: Payer: Medicare Other | Admitting: *Deleted

## 2018-10-20 DIAGNOSIS — I25758 Atherosclerosis of native coronary artery of transplanted heart with other forms of angina pectoris: Secondary | ICD-10-CM | POA: Diagnosis not present

## 2018-10-20 DIAGNOSIS — E782 Mixed hyperlipidemia: Secondary | ICD-10-CM | POA: Diagnosis not present

## 2018-10-20 LAB — HEPATIC FUNCTION PANEL
ALT: 38 IU/L — ABNORMAL HIGH (ref 0–32)
AST: 34 IU/L (ref 0–40)
Albumin: 4.3 g/dL (ref 3.8–4.8)
Alkaline Phosphatase: 59 IU/L (ref 39–117)
Bilirubin Total: 0.4 mg/dL (ref 0.0–1.2)
Bilirubin, Direct: 0.15 mg/dL (ref 0.00–0.40)
Total Protein: 6.6 g/dL (ref 6.0–8.5)

## 2018-10-20 LAB — LIPID PANEL
Chol/HDL Ratio: 2.4 ratio (ref 0.0–4.4)
Cholesterol, Total: 190 mg/dL (ref 100–199)
HDL: 78 mg/dL (ref >39)
LDL Calculated: 91 mg/dL (ref 0–99)
Triglycerides: 103 mg/dL (ref 0–149)
VLDL Cholesterol Cal: 21 mg/dL (ref 5–40)

## 2018-10-23 ENCOUNTER — Telehealth: Payer: Self-pay | Admitting: Interventional Cardiology

## 2018-10-23 DIAGNOSIS — E782 Mixed hyperlipidemia: Secondary | ICD-10-CM

## 2018-10-23 NOTE — Telephone Encounter (Signed)
Spoke with pt and went over results and recommendations per Dr. Tamala Julian. Pt in agreement with plan for Lipid Clinic. Referral placed.

## 2018-10-23 NOTE — Telephone Encounter (Signed)
New Message   Pt is calling back for results    Please call back   

## 2018-10-28 DIAGNOSIS — H4040X0 Glaucoma secondary to eye inflammation, unspecified eye, stage unspecified: Secondary | ICD-10-CM | POA: Insufficient documentation

## 2018-10-28 DIAGNOSIS — H401113 Primary open-angle glaucoma, right eye, severe stage: Secondary | ICD-10-CM | POA: Diagnosis not present

## 2018-10-28 DIAGNOSIS — H401122 Primary open-angle glaucoma, left eye, moderate stage: Secondary | ICD-10-CM | POA: Diagnosis not present

## 2018-10-28 DIAGNOSIS — H4041X3 Glaucoma secondary to eye inflammation, right eye, severe stage: Secondary | ICD-10-CM | POA: Insufficient documentation

## 2018-10-28 DIAGNOSIS — H35033 Hypertensive retinopathy, bilateral: Secondary | ICD-10-CM | POA: Diagnosis not present

## 2018-10-28 DIAGNOSIS — H401190 Primary open-angle glaucoma, unspecified eye, stage unspecified: Secondary | ICD-10-CM | POA: Insufficient documentation

## 2018-10-28 DIAGNOSIS — H209 Unspecified iridocyclitis: Secondary | ICD-10-CM | POA: Diagnosis not present

## 2018-11-02 NOTE — Progress Notes (Signed)
Cardiology Office Note:    Date:  11/03/2018   ID:  Pamela Lowe, DOB January 12, 1951, MRN [id number]  PCP:  Pamela Stalker, PA-C  Cardiologist:  No primary care provider on file.   Referring MD: Pamela Stalker, PA-C   Chief Complaint  Patient presents with  . Coronary Artery Disease  . Hyperlipidemia    History of Present Illness:    Pamela Lowe is a 68 y.o. female with a hx of  CAD with significant LAD by Cor CTA/FFR, hyperlipidemia, hypertension, and PAD.  Patient is doing well.  The shortness of breath that she was complaining of when he first met earlier this year has completely resolved.  She is not having chest pain.  We personally reviewed her heart flow CT FFR coronary tree demonstrating severe obstruction in the mid LAD and mid first diagonal.  Her right coronary and circumflex showed no evidence of ischemia.  I correlated exertional dyspnea that she was previously having with the coronary disease.  We discussed risk going forward of acute events and estimated the likelihood to be 2-3 fold higher than an age-matched cohort with no disease.  Actual risk was stated to be 2 to 5% risk of an acute ischemic event over the next 12 months.  Past Medical History:  Diagnosis Date  . Allergic rhinitis   . Breast cyst 2007   Right  . Cataract    surgery 07/2017  . Depression 2006   w/ menopause  . Family history of breast cancer   . Family history of colon cancer   . Family history of kidney cancer 01/15/2018  . Family history of lung cancer   . Family history of pancreatic cancer   . Family history of prostate cancer   . Family history of uterine cancer   . GERD (gastroesophageal reflux disease)   . Glaucoma   . History of hysterectomy, supracervical   . Hyperlipidemia   . Hypertension   . 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   MLH1 [id number] (Splice site)  . Multinodular goiter   . Tobacco user     Past Surgical History:  Procedure Laterality Date  .  BREAST BIOPSY Bilateral 1985  . CATARACT EXTRACTION Right 07/2017  . COLONOSCOPY    . SUPRACERVICAL ABDOMINAL HYSTERECTOMY  1986   h/o supracervical hysterectomy  . UPPER GASTROINTESTINAL ENDOSCOPY      Current Medications: Current Meds  Medication Sig  . aspirin 325 MG tablet Take 1 tablet by mouth daily.  . benazepril (LOTENSIN) 20 MG tablet Take 1 tablet (20 mg total) by mouth daily.  . brimonidine-timolol (COMBIGAN) 0.2-0.5 % ophthalmic solution Apply 1 drop to eye every 12 (twelve) hours.   . dorzolamide (TRUSOPT) 2 % ophthalmic solution Apply to eye.  . folic acid (FOLVITE) 1 MG tablet Take by mouth.  . Golimumab (SIMPONI ARIA IV) Inject 12.5 mLs into the vein every 8 (eight) weeks.   . hydrochlorothiazide (MICROZIDE) 12.5 MG capsule TAKE 1 CAPSULE BY MOUTH ONCE DAILY IN THE MORNING FOR 90 DAYS  . methotrexate (RHEUMATREX) 2.5 MG tablet Take by mouth.  . metoprolol succinate (TOPROL-XL) 25 MG 24 hr tablet Take 1 tablet (25 mg total) by mouth daily.  . nitroGLYCERIN (NITROSTAT) 0.4 MG SL tablet Place 1 tablet (0.4 mg total) under the tongue every 5 (five) minutes as needed for chest pain.  . Omega-3 Fatty Acids (FISH OIL PO) Take by mouth daily.  [name] omeprazole (PRILOSEC) 20 MG capsule Take 20 mg by mouth 2 (two) times  daily. As needed  . prednisoLONE acetate (PRED FORTE) 1 % ophthalmic suspension Place 1 drop into both eyes 4 (four) times daily.   . predniSONE (DELTASONE) 10 MG tablet Take 5 mg by mouth daily.   . rosuvastatin (CRESTOR) 40 MG tablet Take 1 tablet (40 mg total) by mouth daily.     Allergies:   Bactrim [sulfamethoxazole-trimethoprim] and Wellbutrin [bupropion hcl]   Social History   Socioeconomic History  . Marital status: Married    Spouse name: Pamela Lowe  . Number of children: 0  . Years of education: Not on file  . Highest education level: Not on file  Occupational History  . Occupation: Special Ed Teacher    Comment: [employer] [place]  .  Financial resource strain: Not on file  . Food insecurity    Worry: Not on file    Inability: Not on file  . Transportation needs    Medical: Not on file    Non-medical: Not on file  Tobacco Use  . Smoking status: Former Smoker    Packs/day: 0.25    Years: 30.00    Pack years: 7.50    Types: Cigarettes    Quit date: 08/01/2017    Years since quitting: 1.2  . Smokeless tobacco: Never Used  . Tobacco comment: using nicoderm patches  Substance and Sexual Activity  . Alcohol use: Not Currently  . Drug use: No  . Sexual activity: Not Currently    Partners: Male    Birth control/protection: Surgical, Post-menopausal    Comment: Hysterectomy  Lifestyle  . Physical activity    Days per week: Not on file    Minutes per session: Not on file  . Stress: Not on file  Relationships  . Social Herbalist on phone: Not on file    Gets together: Not on file    Attends religious service: Not on file    Active member of club or organization: Not on file    Attends meetings of clubs or organizations: Not on file    Relationship status: Not on file  Other Topics Concern  . Not on file  Social History Narrative  . Not on file     Family History: The patient's family history includes Allergies in her brother; Alzheimer's disease in her mother; Brain cancer in her paternal aunt; Breast cancer (age of onset: 38) in her sister; Breast cancer (age of onset: 58) in her sister; Breast cancer (age of onset: 76) in her sister; Breast cancer (age of onset: 48) in her cousin; Breast cancer (age of onset: 60) in her maternal aunt; Clotting disorder in her sister; Colon cancer (age of onset: 60) in her paternal grandfather; Deep vein thrombosis in her mother and sister; Heart attack in her maternal grandfather; Heart attack (age of onset: 19) in her brother; Hypertension in her father and mother; Kidney cancer (age of onset: 38) in her cousin; Kidney cancer (age of onset: 32) in her father; Kidney  cancer (age of onset: 76) in her cousin; Lung cancer (age of onset: 13) in her paternal uncle; Lung cancer (age of onset: 86) in her maternal uncle; Osteopenia in her mother; Pancreatic cancer (age of onset: 40) in her paternal uncle; Pancreatic cancer (age of onset: 47) in her paternal aunt; Pancreatic cancer (age of onset: 10) in her paternal aunt; Pneumonia in her father; Prostate cancer (age of onset: 56) in her cousin; Prostate cancer (age of onset: 68) in her maternal uncle; Uterine  cancer (age of onset: 31) in her cousin; Uterine cancer (age of onset: 72) in her paternal grandmother. There is no history of Thyroid disease.  ROS:   Please see the history of present illness.    She denies medication side effects.  All other systems reviewed and are negative.  EKGs/Labs/Other Studies Reviewed:    The following studies were reviewed today:  CT FFR/ Cor CTA 08/20/2018: 1. Left Main: 1.0. 2. LAD: Proximal: 0.95, mid: 0.84, distal: 0.5. 3. D2: Proximal: 0.9, distal: 0.63. 4. LCX: 0.95. 5. OM1: 0.95. 6. RCA: Proximal: 0.99, mid: 0.84, distal; 0.8.  IMPRESSION: 1. CT FFR analysis showed critical stenosis in the mid LAD and in the mid portion of a small D2. A cardiac catheterization is Recommended.   EKG:  EKG EKG is not been repeated.  Recent Labs: 05/22/2018: NT-Pro BNP 37 08/01/2018: BUN 20; Creatinine, Ser 0.90; Potassium 3.9; Sodium 142 10/20/2018: ALT 38  Recent Lipid Panel    Component Value Date/Time   CHOL 190 10/20/2018 0907   TRIG 103 10/20/2018 0907   HDL 78 10/20/2018 0907   CHOLHDL 2.4 10/20/2018 0907   LDLCALC 91 10/20/2018 0907    Physical Exam:    VS:  BP 114/72   Pulse 70   Ht 5' 6" (1.676 m)   Wt 230 lb 12.8 oz (104.7 kg)   LMP 03/19/1998 Comment: h/o supracervical hysterectomy  SpO2 98%   BMI 37.25 kg/m     Wt Readings from Last 3 Encounters:  11/03/18 230 lb 12.8 oz (104.7 kg)  08/21/18 223 lb (101.2 kg)  07/23/18 225 lb (102.1 kg)     GEN:  Obese. No acute distress HEENT: Normal NECK: No JVD. LYMPHATICS: No lymphadenopathy CARDIAC:  RRR without murmur, gallop, or edema. VASCULAR:  Normal Pulses. No bruits. RESPIRATORY:  Clear to auscultation without rales, wheezing or rhonchi  ABDOMEN: Soft, non-tender, non-distended, No pulsatile mass, MUSCULOSKELETAL: No deformity  SKIN: Warm and dry NEUROLOGIC:  Alert and oriented x 3 PSYCHIATRIC:  Normal affect   ASSESSMENT:    1. Coronary artery disease involving native coronary artery of native heart with other form of angina pectoris ([place])   2. DOE (dyspnea on exertion)   3. Essential hypertension   4. Mixed hyperlipidemia   5. PAD (peripheral artery disease) ([place])   6. Educated About Covid-19 Virus Infection    PLAN:    In order of problems listed above:  1. Obstructive coronary disease involving the mid LAD and mid first diagonal.  After starting anti-ischemic and preventive therapy, symptoms have completely resolved.  She is walking frequently without limitations.  She has not needed to use nitroglycerin.  She feels continued medical therapy is the approach that she prefers at this time.  I think this is reasonable given the success that we have had to this point. 2. Dyspnea on exertion has resolved. 3. Excellent blood pressure control on triple therapy including a beta-blocker, ACE inhibitor, and diuretic. 4. LDL is not at target.  She is at high risk for ischemic events.  She will be sent to the lipid clinic to consider adding PCSK9 therapy to max dose statin therapy.  The desired target is LDL less than 55. 5. Known PAD.  Secondary risk prevention will help prevent progression. 6. Masking, handwashing, and social distancing is stressed to avoid COVID infection.  Overall education and awareness concerning primary/secondary risk prevention was discussed in detail: LDL less than 70, hemoglobin A1c less than 7, blood pressure target less  than 130/80 mmHg, >150 minutes of  moderate aerobic activity per week, avoidance of smoking, weight control (via diet and exercise), and continued surveillance/management of/for obstructive sleep apnea.  Greater than 50% of the time during this office visit was spent in education, counseling, and coordination of care related to underlying disease process and testing as outlined.   Medication Adjustments/Labs and Tests Ordered: Current medicines are reviewed at length with the patient today.  Concerns regarding medicines are outlined above.  No orders of the defined types were placed in this encounter.  No orders of the defined types were placed in this encounter.   Patient Instructions  Medication Instructions:  Your physician recommends that you continue on your current medications as directed. Please refer to the Current Medication list given to you today.  If you need a refill on your cardiac medications before your next appointment, please call your pharmacy.   Lab work: None If you have labs (blood work) drawn today and your tests are completely normal, you will receive your results only by: [name] MyChart Message (if you have MyChart) OR . A paper copy in the mail If you have any lab test that is abnormal or we need to change your treatment, we will call you to review the results.  Testing/Procedures: None  Follow-Up: At [facility], you and your health needs are our priority.  As part of our continuing mission to provide you with exceptional heart care, we have created designated Provider Care Teams.  These Care Teams include your primary Cardiologist (physician) and Advanced Practice Providers (APPs -  Physician Assistants and Nurse Practitioners) who all work together to provide you with the care you need, when you need it. You will need a follow up appointment in 6 months.  Please call our office 2 months in advance to schedule this appointment.  You may see Dr. Tamala Julian or one of the following Advanced Practice  Providers on your designated Care Team:   [name], NP Pamela Kicks, NP . Kathyrn Drown, NP  Any Other Special Instructions Will Be Listed Below (If Applicable).   You have been referred to our Lipid clinic.       Signed, Sinclair Grooms, MD  11/03/2018 12:18 PM    [place]

## 2018-11-03 ENCOUNTER — Other Ambulatory Visit: Payer: Self-pay

## 2018-11-03 ENCOUNTER — Ambulatory Visit (INDEPENDENT_AMBULATORY_CARE_PROVIDER_SITE_OTHER): Payer: Medicare Other | Admitting: Interventional Cardiology

## 2018-11-03 ENCOUNTER — Encounter: Payer: Self-pay | Admitting: Interventional Cardiology

## 2018-11-03 VITALS — BP 114/72 | HR 70 | Ht 66.0 in | Wt 230.8 lb

## 2018-11-03 DIAGNOSIS — I25118 Atherosclerotic heart disease of native coronary artery with other forms of angina pectoris: Secondary | ICD-10-CM | POA: Diagnosis not present

## 2018-11-03 DIAGNOSIS — R0600 Dyspnea, unspecified: Secondary | ICD-10-CM

## 2018-11-03 DIAGNOSIS — I739 Peripheral vascular disease, unspecified: Secondary | ICD-10-CM

## 2018-11-03 DIAGNOSIS — E782 Mixed hyperlipidemia: Secondary | ICD-10-CM | POA: Diagnosis not present

## 2018-11-03 DIAGNOSIS — R0609 Other forms of dyspnea: Secondary | ICD-10-CM | POA: Diagnosis not present

## 2018-11-03 DIAGNOSIS — I119 Hypertensive heart disease without heart failure: Secondary | ICD-10-CM | POA: Diagnosis not present

## 2018-11-03 DIAGNOSIS — Z7189 Other specified counseling: Secondary | ICD-10-CM | POA: Diagnosis not present

## 2018-11-03 DIAGNOSIS — I10 Essential (primary) hypertension: Secondary | ICD-10-CM

## 2018-11-03 NOTE — Patient Instructions (Signed)
Medication Instructions:  Your physician recommends that you continue on your current medications as directed. Please refer to the Current Medication list given to you today.  If you need a refill on your cardiac medications before your next appointment, please call your pharmacy.   Lab work: None If you have labs (blood work) drawn today and your tests are completely normal, you will receive your results only by: [name] MyChart Message (if you have MyChart) OR . A paper copy in the mail If you have any lab test that is abnormal or we need to change your treatment, we will call you to review the results.  Testing/Procedures: None  Follow-Up: At [facility], you and your health needs are our priority.  As part of our continuing mission to provide you with exceptional heart care, we have created designated Provider Care Teams.  These Care Teams include your primary Cardiologist (physician) and Advanced Practice Providers (APPs -  Physician Assistants and Nurse Practitioners) who all work together to provide you with the care you need, when you need it. You will need a follow up appointment in 6 months.  Please call our office 2 months in advance to schedule this appointment.  You may see Dr. Tamala Julian or one of the following Advanced Practice Providers on your designated Care Team:   [name], NP Cecilie Kicks, NP . Kathyrn Drown, NP  Any Other Special Instructions Will Be Listed Below (If Applicable).   You have been referred to our Lipid clinic.

## 2018-11-04 DIAGNOSIS — H40043 Steroid responder, bilateral: Secondary | ICD-10-CM | POA: Diagnosis not present

## 2018-11-04 DIAGNOSIS — H20023 Recurrent acute iridocyclitis, bilateral: Secondary | ICD-10-CM | POA: Diagnosis not present

## 2018-11-04 DIAGNOSIS — H401134 Primary open-angle glaucoma, bilateral, indeterminate stage: Secondary | ICD-10-CM | POA: Diagnosis not present

## 2018-11-13 DIAGNOSIS — M0589 Other rheumatoid arthritis with rheumatoid factor of multiple sites: Secondary | ICD-10-CM | POA: Diagnosis not present

## 2018-11-14 DIAGNOSIS — M940 Chondrocostal junction syndrome [Tietze]: Secondary | ICD-10-CM | POA: Diagnosis not present

## 2018-11-18 DIAGNOSIS — Z79899 Other long term (current) drug therapy: Secondary | ICD-10-CM | POA: Diagnosis not present

## 2018-11-18 DIAGNOSIS — M2550 Pain in unspecified joint: Secondary | ICD-10-CM | POA: Diagnosis not present

## 2018-11-18 DIAGNOSIS — M0589 Other rheumatoid arthritis with rheumatoid factor of multiple sites: Secondary | ICD-10-CM | POA: Diagnosis not present

## 2018-11-18 DIAGNOSIS — H209 Unspecified iridocyclitis: Secondary | ICD-10-CM | POA: Diagnosis not present

## 2018-11-19 ENCOUNTER — Other Ambulatory Visit: Payer: Self-pay

## 2018-11-20 ENCOUNTER — Ambulatory Visit (INDEPENDENT_AMBULATORY_CARE_PROVIDER_SITE_OTHER): Payer: Medicare Other | Admitting: Pharmacist

## 2018-11-20 DIAGNOSIS — I25758 Atherosclerosis of native coronary artery of transplanted heart with other forms of angina pectoris: Secondary | ICD-10-CM | POA: Diagnosis not present

## 2018-11-20 DIAGNOSIS — E782 Mixed hyperlipidemia: Secondary | ICD-10-CM | POA: Diagnosis not present

## 2018-11-20 DIAGNOSIS — I700 Atherosclerosis of aorta: Secondary | ICD-10-CM

## 2018-11-20 DIAGNOSIS — I25118 Atherosclerotic heart disease of native coronary artery with other forms of angina pectoris: Secondary | ICD-10-CM

## 2018-11-20 NOTE — Patient Instructions (Addendum)
It was nice to meet you today  Your LDL is currently 32 and Dr Tamala Julian would like it to be < 55  Continue taking your rosuvastatin (Crestor) 40mg  each day  I will submit information to your insurance for Repatha injections and let you know when they are approved. You will give 1 injection into the subcutaneous tissue of your abdomen every 2 weeks  Recheck fasting lab work in 2 months  Call Jinny Blossom, PharmD with any concerns [phone]  Ask your rheumatologist if they submitted a prior authorization to your [employer] insurance for Humira coverage. If they can get a prior authorization approved, you can use a copay card to make the Humira affordable

## 2018-11-20 NOTE — Progress Notes (Signed)
Patient ID: Pamela Lowe                 DOB: 10/06/1950                    MRN: [id number]     HPI: Pamela Lowe is a pleasant 68 y.o. female patient referred to lipid clinic by Dr Tamala Julian. PMH is significant for CAD with CT FFR revealing severe obstruction to the mid LAD and mid first diagonal, PAD, HLD, RA, and HTN. She currently takes rosuvastatin 74m daily and was referred to lipid clinic to discuss addition of PCSK9i therapy.   Pt presents today in good spirits. She reports tolerating her rosuvastatin well. She has a family history of heart disease - her brother died from an MI at age 67 She has had a difficult year - she used to stay active in a hiking club going on hikes up to 5 miles which she enjoyed. She had cataract surgery last year and then was diagnosed with uveitis, triggered by a new diagnosis of rheumatoid arthritis. She has gained about 50 lbs from her steroids. She is walking less now due to foot pain from her RA, although she has started using a stationary bike. She eats very healthy.  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 States that her rheumatologist could not get Humira covered so she is not sure if PCSK9i would be covered. She does not qualify for patient assistance due to income.  Current Medications: rosuvastatin 436mdaily Risk Factors: CAD, PAD, HTN, former tobacco abuse, family hx of CAD LDL goal: <5552mL per Dr SmiTamala Julianiet: Does not eat fried food, bakes everything, uses olive oil, trying to watch saturated fats and trans fats. Watching carbs and sugars. Has a protein shake in the AM.  Exercise: Used to be in a hiking club last year - would hike up to 5 miles. Then had cataract surgery which turned into uveitis, triggered by new dx of RA - 50 lb weight gain from steroids. Walking less now (foot pain from RA), has started using stationary bike.  Family History: The patient's family history includes Allergies in her brother;  Alzheimer's disease in her mother; Brain cancer in her paternal aunt; Breast cancer (age of onset: 42)51n her sister; Breast cancer (age of onset: 54)41n her sister; Breast cancer (age of onset: 59)73n her sister; Breast cancer (age of onset: 60)23n her cousin; Breast cancer (age of onset: 67)66n her maternal aunt; Clotting disorder in her sister; Colon cancer (age of onset: 60)61n her paternal grandfather; Deep vein thrombosis in her mother and sister; Heart attack in her maternal grandfather; Heart attack (age of onset: 60)60n her brother; Hypertension in her father and mother; Kidney cancer (age of onset: 52)23n her cousin; Kidney cancer (age of onset: 57)6n her father; Kidney cancer (age of onset: 59)42n her cousin; Lung cancer (age of onset: 53)66n her paternal uncle; Lung cancer (age of onset: 60)17n her maternal uncle; Osteopenia in her mother; Pancreatic cancer (age of onset: 55)73n her paternal uncle; Pancreatic cancer (age of onset: 64)26n her paternal aunt; Pancreatic cancer (age of onset: 65)62n her paternal aunt; Pneumonia in her father; Prostate cancer (age of onset: 57)30n her cousin; Prostate cancer (age of onset: 68)39n her maternal uncle; Uterine cancer (age of onset: 62)26n her cousin; Uterine cancer (age of onset: 83)36  in her paternal grandmother. There is no history of Thyroid disease.  Social History: Former tobacco abuse 1/4 PPD, quit in 2019. Denies alcohol and tobacco use.  Labs: 10/20/18: TC 190, TG 103, HDL 78, LDL 91 (rosuvastatin 13m daily)  Past Medical History:  Diagnosis Date  . Allergic rhinitis   . Breast cyst 2007   Right  . Cataract    surgery 07/2017  . Depression 2006   w/ menopause  . Family history of breast cancer   . Family history of colon cancer   . Family history of kidney cancer 01/15/2018  . Family history of lung cancer   . Family history of pancreatic cancer   . Family history of prostate cancer   . Family history of uterine cancer   . GERD  (gastroesophageal reflux disease)   . Glaucoma   . History of hysterectomy, supracervical   . Hyperlipidemia   . Hypertension   . 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   MLH1 [id number](Splice site)  . Multinodular goiter   . Tobacco user     Current Outpatient Medications on File Prior to Visit  Medication Sig Dispense Refill  . aspirin 325 MG tablet Take 1 tablet by mouth daily.    . benazepril (LOTENSIN) 20 MG tablet Take 1 tablet (20 mg total) by mouth daily.    . brimonidine-timolol (COMBIGAN) 0.2-0.5 % ophthalmic solution Apply 1 drop to eye every 12 (twelve) hours.     . dorzolamide (TRUSOPT) 2 % ophthalmic solution Apply to eye.    . folic acid (FOLVITE) 1 MG tablet Take by mouth.    . Golimumab (SIMPONI ARIA IV) Inject 12.5 mLs into the vein every 8 (eight) weeks.     . hydrochlorothiazide (MICROZIDE) 12.5 MG capsule TAKE 1 CAPSULE BY MOUTH ONCE DAILY IN THE MORNING FOR 90 DAYS    . methotrexate (RHEUMATREX) 2.5 MG tablet Take by mouth.    . metoprolol succinate (TOPROL-XL) 25 MG 24 hr tablet Take 1 tablet (25 mg total) by mouth daily. 90 tablet 3  . nitroGLYCERIN (NITROSTAT) 0.4 MG SL tablet Place 1 tablet (0.4 mg total) under the tongue every 5 (five) minutes as needed for chest pain. 25 tablet 3  . Omega-3 Fatty Acids (FISH OIL PO) Take by mouth daily.    .Marland Kitchenomeprazole (PRILOSEC) 20 MG capsule Take 20 mg by mouth 2 (two) times daily. As needed  2  . prednisoLONE acetate (PRED FORTE) 1 % ophthalmic suspension Place 1 drop into both eyes 4 (four) times daily.     . predniSONE (DELTASONE) 10 MG tablet Take 5 mg by mouth daily.     . rosuvastatin (CRESTOR) 40 MG tablet Take 1 tablet (40 mg total) by mouth daily. 90 tablet 3   No current facility-administered medications on file prior to visit.     Allergies  Allergen Reactions  . Bactrim [Sulfamethoxazole-Trimethoprim] Other (See Comments)    Chills/achy  . Wellbutrin [Bupropion Hcl] Rash    rash    Assessment/Plan:   1. Hyperlipidemia - LDL 920mdL on rosuvastatin 4031maily, above goal < 48m33m per Dr SmitTamala Julian will continue on rosuvastatin 40mg74mly. Discussed expected benefits, injection technique, and side effects of PCSK9i therapy. Pt is willing to start on Repatha 140mg 84m- prior authorization has been submitted. If approved[facility], will schedule f/u labs at that time pending approval.  MeganJinny Blossom  Vivien Rota, PharmD, BCACP, [facility] [address] [place], [place] [address] Phone: [phone]; Fax: [phone] 11/20/2018 3:35 PM

## 2018-11-21 ENCOUNTER — Encounter: Payer: Self-pay | Admitting: Obstetrics & Gynecology

## 2018-11-21 ENCOUNTER — Other Ambulatory Visit: Payer: Self-pay

## 2018-11-21 ENCOUNTER — Ambulatory Visit (INDEPENDENT_AMBULATORY_CARE_PROVIDER_SITE_OTHER): Payer: Medicare Other | Admitting: Obstetrics & Gynecology

## 2018-11-21 VITALS — BP 96/78 | HR 64 | Temp 97.9°F | Ht 66.5 in | Wt 227.6 lb

## 2018-11-21 DIAGNOSIS — Z23 Encounter for immunization: Secondary | ICD-10-CM | POA: Diagnosis not present

## 2018-11-21 DIAGNOSIS — Z9189 Other specified personal risk factors, not elsewhere classified: Secondary | ICD-10-CM | POA: Diagnosis not present

## 2018-11-21 DIAGNOSIS — Z124 Encounter for screening for malignant neoplasm of cervix: Secondary | ICD-10-CM | POA: Diagnosis not present

## 2018-11-21 DIAGNOSIS — Z01419 Encounter for gynecological examination (general) (routine) without abnormal findings: Secondary | ICD-10-CM

## 2018-11-21 DIAGNOSIS — R978 Other abnormal tumor markers: Secondary | ICD-10-CM

## 2018-11-21 NOTE — Progress Notes (Signed)
68 y.o. G1P0010 Married [place] American female here for annual exam.  Reports she continues to have issues with uveitis.  Has vision loss in her right eye.  She is now followed by provider in [place].  She is pleased with her care now.    Denies vaginal bleeding.    She had genetic testing last year.  MLH1 mutation was noted.  Her cancer risk is as follows (as from Dietitian):  -Colorectal: 46-49%. (having yearly colonoscopy) -Endometrial: 43-57%. (h/o supracervical hysterectomy) -Ovarian: 5-20% (have recommended pt proceed with BSO) -Gastric: 5/7% (having endoscopy every 3 years) -Pancreatic: 6% -Bladder: 2-4% -Biliary Tract: 2-4% -Urothelial: 0.2-5% -Small Bowel: 0.4-11% -Prostate: 0-17% -Breast: 12-17% -Brain/CNS: not well established  We have discussed ovary removal.  She has declined this at this time.    Patient's last menstrual period was 03/19/1998.          Sexually active: Yes.    The current method of family planning is status post hysterectomy.    Exercising: Yes.    some Smoker:  no  Health Maintenance: Pap:  10/25/17 Neg   04/26/15 Neg. HR HPV:neg  History of abnormal Pap:  no MMG:  05/02/18 BIRADS2:benign. F/u 1 year  Colonoscopy:  05/07/18 polyps.  Tubular adenoma.  Followed by Dr. Ardis Hughs.  Having the colonoscopy yearly and endoscopy every two years.  BMD:   05/01/17 Osteopenia  TDaP:  2008 Pneumonia vaccine(s):  done Shingrix:   Reviewed with pt today Hep C testing: PCP Screening Labs: PCP   reports that she quit smoking about 15 months ago. Her smoking use included cigarettes. She has a 7.50 pack-year smoking history. She has never used smokeless tobacco. She reports previous alcohol use. She reports that she does not use drugs.  Past Medical History:  Diagnosis Date  . Allergic rhinitis   . Breast cyst 2007   Right  . Cataract    surgery 07/2017  . Depression 2006   w/ menopause  . Family history of breast cancer   . Family history  of colon cancer   . Family history of kidney cancer 01/15/2018  . Family history of lung cancer   . Family history of pancreatic cancer   . Family history of prostate cancer   . Family history of uterine cancer   . GERD (gastroesophageal reflux disease)   . Glaucoma   . History of hysterectomy, supracervical   . Hyperlipidemia   . Hypertension   . 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   MLH1 [id number] (Splice site)  . Multinodular goiter   . Tobacco user     Past Surgical History:  Procedure Laterality Date  . BREAST BIOPSY Bilateral 1985  . CATARACT EXTRACTION Right 07/2017  . COLONOSCOPY    . SUPRACERVICAL ABDOMINAL HYSTERECTOMY  1986   h/o supracervical hysterectomy  . UPPER GASTROINTESTINAL ENDOSCOPY      Current Outpatient Medications  Medication Sig Dispense Refill  . ASPIRIN 81 PO Take by mouth daily.    . benazepril (LOTENSIN) 20 MG tablet Take 1 tablet (20 mg total) by mouth daily.    . brimonidine (ALPHAGAN) 0.2 % ophthalmic solution     . dorzolamide (TRUSOPT) 2 % ophthalmic solution Apply to eye.    . folic acid (FOLVITE) 1 MG tablet Take by mouth.    . Golimumab (SIMPONI ARIA IV) Inject 12.5 mLs into the vein every 8 (eight) weeks.     . hydrochlorothiazide (MICROZIDE) 12.5 MG capsule TAKE 1 CAPSULE BY MOUTH ONCE DAILY  IN THE MORNING FOR 90 DAYS    . methotrexate (RHEUMATREX) 2.5 MG tablet Take 20 mg by mouth once a week.     . metoprolol succinate (TOPROL-XL) 25 MG 24 hr tablet Take 1 tablet (25 mg total) by mouth daily. 90 tablet 3  . Omega-3 Fatty Acids (FISH OIL PO) Take by mouth daily.    [name] omeprazole (PRILOSEC) 20 MG capsule Take 20 mg by mouth 2 (two) times daily. As needed  2  . prednisoLONE acetate (PRED FORTE) 1 % ophthalmic suspension Place 1 drop into both eyes 4 (four) times daily.     . predniSONE (DELTASONE) 5 MG tablet Take 5 mg by mouth daily.    . rosuvastatin (CRESTOR) 40 MG tablet Take 1 tablet (40 mg total) by mouth daily. 90 tablet 3  .  nitroGLYCERIN (NITROSTAT) 0.4 MG SL tablet Place 1 tablet (0.4 mg total) under the tongue every 5 (five) minutes as needed for chest pain. 25 tablet 3   No current facility-administered medications for this visit.     Family History  Problem Relation Age of Onset  . Kidney cancer Father 20  . Pneumonia Father   . Hypertension Father   . Hypertension Mother   . Deep vein thrombosis Mother   . Alzheimer's disease Mother   . Osteopenia Mother   . Heart attack Maternal Grandfather   . Breast cancer Sister 59  . Allergies Brother   . Breast cancer Sister 30       Stage 0  . Breast cancer Sister 41       Stage 3  . Heart attack Brother 79  . Clotting disorder Sister   . Deep vein thrombosis Sister        unclear cause  . Pancreatic cancer Paternal Aunt 39  . Breast cancer Maternal Aunt 67  . Prostate cancer Maternal Uncle 68  . Pancreatic cancer Paternal Uncle 1  . Uterine cancer Paternal Grandmother 20  . Colon cancer Paternal Grandfather 35  . Pancreatic cancer Paternal Aunt 43  . Lung cancer Paternal Uncle 81  . Brain cancer Paternal Aunt   . Kidney cancer Cousin 32  . Kidney cancer Cousin 32  . Uterine cancer Cousin 38  . Prostate cancer Cousin 38  . Lung cancer Maternal Uncle 42  . Breast cancer Cousin 46  . Thyroid disease Neg Hx     Review of Systems  All other systems reviewed and are negative.   Exam:   BP 96/78   Pulse 64   Temp 97.9 F (36.6 C) (Temporal)   Ht 5' 6.5" (1.689 m)   Wt 227 lb 9.6 oz (103.2 kg)   LMP 03/19/1998 Comment: h/o supracervical hysterectomy  BMI 36.19 kg/m   Height: 5' 6.5" (168.9 cm)  Ht Readings from Last 3 Encounters:  11/21/18 5' 6.5" (1.689 m)  11/03/18 5' 6"  (1.676 m)  08/21/18 5' 6"  (1.676 m)    General appearance: alert, cooperative and appears stated age Head: Normocephalic, without obvious abnormality, atraumatic Neck: no adenopathy, supple, symmetrical, trachea midline and thyroid normal to inspection and  palpation Lungs: clear to auscultation bilaterally Breasts: normal appearance, no masses or tenderness Heart: regular rate and rhythm Abdomen: soft, non-tender; bowel sounds normal; no masses,  no organomegaly Extremities: extremities normal, atraumatic, no cyanosis or edema Skin: Skin color, texture, turgor normal. No rashes or lesions Lymph nodes: Cervical, supraclavicular, and axillary nodes normal. No abnormal inguinal nodes palpated Neurologic: Grossly normal   Pelvic: External  genitalia:  no lesions              Urethra:  normal appearing urethra with no masses, tenderness or lesions              Bartholins and Skenes: normal                 Vagina: normal appearing vagina with normal color and discharge, no lesions              Cervix: no lesions              Pap taken: No. Bimanual Exam:  Uterus:  uterus absent              Adnexa: no mass, fullness, tenderness               Rectovaginal: Confirms               Anus:  normal sphincter tone, no lesions  Chaperone was present for exam.  A:  Well Woman with normal exam H/o supracervical hysterectomy 2000 due to fibroids and endometriosis  H/o elevated lipids Hypertension Familly hx of breast cancer in three sisters with personal lifetime risk of breast cancer of 18% MLH1 gene mutation  P:   Mammogram guidelines reviewed.  Doing 3D MMG. pap smear neg 2019.  Not indicated today. Yearly colonoscopy testing is being done and every three year endoscopy Ca-125 obtained today.  She will return for PUS Urine micro recommended.  Will plan when does PUS. Routine screening lab work done with [name] Declines shingrix vaccination. Tdap updated today. return annually or prn

## 2018-11-22 LAB — CA 125: Cancer Antigen (CA) 125: 8.2 U/mL (ref 0.0–38.1)

## 2018-11-25 ENCOUNTER — Telehealth: Payer: Self-pay | Admitting: Pharmacist

## 2018-11-25 NOTE — Telephone Encounter (Signed)
Repatha prior authorization denied since it is not on patient's covered medications with her insurance plan. Appeals has been faxed today.

## 2018-11-26 ENCOUNTER — Ambulatory Visit: Payer: Medicare Other | Admitting: Interventional Cardiology

## 2018-11-27 ENCOUNTER — Other Ambulatory Visit: Payer: Self-pay

## 2018-11-27 ENCOUNTER — Ambulatory Visit (INDEPENDENT_AMBULATORY_CARE_PROVIDER_SITE_OTHER): Payer: Medicare Other | Admitting: Obstetrics & Gynecology

## 2018-11-27 ENCOUNTER — Ambulatory Visit (INDEPENDENT_AMBULATORY_CARE_PROVIDER_SITE_OTHER): Payer: Medicare Other

## 2018-11-27 VITALS — BP 130/70 | HR 60 | Temp 97.9°F | Ht 66.5 in | Wt 228.0 lb

## 2018-11-27 DIAGNOSIS — Z9189 Other specified personal risk factors, not elsewhere classified: Secondary | ICD-10-CM | POA: Diagnosis not present

## 2018-11-27 DIAGNOSIS — I25118 Atherosclerotic heart disease of native coronary artery with other forms of angina pectoris: Secondary | ICD-10-CM | POA: Diagnosis not present

## 2018-11-27 DIAGNOSIS — Z1589 Genetic susceptibility to other disease: Secondary | ICD-10-CM

## 2018-11-27 NOTE — Progress Notes (Signed)
68 y.o. G1P0010 Married [place] American female here for pelvic ultrasound due to h/o MLH1 mutation with ovarian cancer risk of 5-20%.  BSO has been recommended.  She has not proceeded with this due to concerns about eyes.  Had cataracts removed and developed uveitis with some change in vision.  Has taken months to get better control of this.  Importance of BSO discussed.  With gene mutation, ovarian cancer risk discussed.  She is a little anxious about surgery.  Laparoscopic approach with risks and benefits reviewed.  Recovery discussed.  Procedure discussed with patient.  Risks discussed including but not limited to bleeding, <1% risk of receiving a  transfusion, infection, risk of bowel/bladder/ureteral/vascular injury discussed as well as possible need for additional surgery if injury does occur discussed.  DVT/PE and rare risk of death discussed.  My actual complications with prior surgeries discussed.  Patient aware if pathology abnormal she may need additional treatment.  All questions answered.    Patient's last menstrual period was 03/19/1998.  Contraception: Supracervical hysterectomy  Findings:  UTERUS: surgically absent EMS: n/a ADNEXA: Left ovary: 2.1 x 1.8 x 1.2cm       Right ovary: 2.5 x 1.9 x 1.3cm CUL DE SAC: no free fluid  Discussion:  Findings reviewed.  Pt understands importance of surgery and proceeding with surgical treatment.  Ca-125 was normal at [facility].  D/w pt importance of repeat PUS and ca-125 in six months if she has not decided to proceed with surgery between now and then.  Pt voices clear understanding.  Assessment:  MLH1 gene mutation with increased ovarian cancer risk  Plan:  She is going to call when ready to proceed with laparoscopic BSO.  ~25 minutes spent with patient >50% of time was in face to face discussion of above.

## 2018-11-29 ENCOUNTER — Encounter: Payer: Self-pay | Admitting: Obstetrics & Gynecology

## 2018-12-02 ENCOUNTER — Telehealth: Payer: Self-pay | Admitting: Obstetrics & Gynecology

## 2018-12-02 NOTE — Telephone Encounter (Signed)
Spoke with patient in regards to benefits for recommended surgery. Patient acknowledges understanding of information presented. Patient states she is currently being treated for uveitis and wants to proceed with surgery when that has resolved.  Patient to contact office when she is ready to proceed with scheduling.  Forwarding to Lamont Snowball, RN

## 2018-12-05 ENCOUNTER — Telehealth: Payer: Self-pay | Admitting: Interventional Cardiology

## 2018-12-05 NOTE — Telephone Encounter (Signed)
I will route to Cecilie Kicks, NP [place] As FYI for appt on 12/17/18.

## 2018-12-05 NOTE — Telephone Encounter (Signed)
New Message   Patient is calling because she went to her PCP and was diagnosis with costochondritis. She was given naproxen but its not really helping. She wants to see a cardiologist so I did schedule an appt for 9/30 with [facility]. Please call to discuss.

## 2018-12-05 NOTE — Telephone Encounter (Signed)
Returned pt's call.  I offered her a sooner virtual appt with Kathyrn Drown, 12/10/2018 and pt accepted. Pt very grateful for the return call.

## 2018-12-08 ENCOUNTER — Telehealth: Payer: Self-pay

## 2018-12-08 NOTE — Telephone Encounter (Signed)
Spoke with pt who gave verbal consent for virtual video visit on Wed, 12/10/18. Reviewed meds with pt and made her aware that we would be calling 51mins prior to her appt time to obtain vitals. Pt verbalized understanding of our conversation and thanked me for the call....jb

## 2018-12-09 DIAGNOSIS — H209 Unspecified iridocyclitis: Secondary | ICD-10-CM | POA: Diagnosis not present

## 2018-12-09 DIAGNOSIS — H401122 Primary open-angle glaucoma, left eye, moderate stage: Secondary | ICD-10-CM | POA: Diagnosis not present

## 2018-12-09 DIAGNOSIS — H401113 Primary open-angle glaucoma, right eye, severe stage: Secondary | ICD-10-CM | POA: Diagnosis not present

## 2018-12-09 NOTE — H&P (View-Only) (Signed)
Virtual Visit via Video Note   This visit type was conducted due to national recommendations for restrictions regarding the COVID-19 Pandemic (e.g. social distancing) in an effort to limit this patient's exposure and mitigate transmission in our community.  Due to her co-morbid illnesses, this patient is at least at moderate risk for complications without adequate follow up.  This format is felt to be most appropriate for this patient at this time.  All issues noted in this document were discussed and addressed.  A limited physical exam was performed with this format.  Please refer to the patient's chart for her consent to telehealth for [facility].   Date:  12/10/2018   ID:  KESHA HURRELL, DOB 1950/04/15, MRN [id number]  Patient Location: Home Provider Location: Home  PCP:  [name], PA-C  Cardiologist:  Sinclair Grooms, MD   Evaluation Performed:  Follow-Up Visit  Chief Complaint:  Follow up for chest pain, seen for Dr. Tamala Julian   History of Present Illness:    MANON BANBURY is a 68 y.o. female with a history of CAD with significant occlusive LAD disease per coronary CTA/FFR 08/2018, hyperlipidemia, hypertension and PAD.  Patient was last seen by Dr. Tamala Julian on 11/03/2018 in follow-up after coronary CT performed with reassessment of symptoms.  She was doing well from a cardiac perspective.  Her shortness of breath had completely resolved and was not having anginal symptoms.  CT with FFR performed 08/2018 reviewed which demonstrated severe obstruction in the mid LAD and first diagonal with her right coronary and left circumflex with no evidence of ischemia.  Her exertional dyspnea was thought to be related to her significant CAD.  No cardiac catheterization was ever performed given that she was relatively asymptomatic after starting anti-ischemic and preventative therapies.  She was walking frequently without limitations and not needing nitroglycerin.  Continued medical therapy  was preferred at that time per patient.  Was recently seen by her PCP and diagnosed with costochondritis 11/14/2018 and started on NSAID therapy which has not resolved her symptoms.  Today, she states that the pain she was experiencing was right-sided upper chest discomfort that was nonexertional and worse with deep inspiration and leaning over.  She states the area is tender to palpation and will radiate at times to her underarm and her mid back area.  She has been experiencing some mild dyspnea on exertion however not at rest.  She has had no diaphoresis, nausea, vomiting or dizziness.  She reports having a family history of blood clot disorders and we talked about proceeding with testing to rule out PE.  We discussed options given her known coronary artery disease per coronary CT scan previously performed and discussed with Dr. Tamala Julian.  We spoke about the possibility of cardiac cath as this may be her anginal equivalent.  She reports taking 1 sublingual nitroglycerin yesterday evening while laying in bed with some resolution.  She states the symptoms tend to be worse at night but she cannot discern if this is because she is more focused on the symptoms at that time.  States that she has tried to continue exercising on her stationary bike with no increase in her symptoms however she reports fatigue secondary to being on methotrexate per patient report.  Plan is to discuss the possibility of cardiac catheterization with Dr. Tamala Julian and call the patient back.  She is willing to proceed if that is necessary.  The patient does not have symptoms concerning for COVID-19 infection (  fever, chills, cough, or new shortness of breath).    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     Allergies:   Bupropion, Sulfamethoxazole-trimethoprim, and Wellbutrin [bupropion hcl]   Social History   Tobacco Use   Smoking status: Former Smoker    Packs/day: 0.25    Years: 30.00    Pack years: 7.50    Types: Cigarettes    Quit date: 08/01/2017    Years since quitting: 1.3   Smokeless tobacco: Never Used   Tobacco comment: using nicoderm patches  Substance Use Topics   Alcohol use: Not Currently   Drug use: No     Family Hx: The patient's family history includes Allergies in her brother; Alzheimer's disease in her mother; Brain cancer in her paternal aunt; Breast cancer (age of onset: 67) in her sister; Breast cancer (age of onset: 8) in her sister; Breast cancer (age of onset: 34) in her sister; Breast cancer (age of onset: 5) in her cousin; Breast cancer (age of onset: 20) in her maternal aunt; Clotting disorder in her sister; Colon cancer (age of onset: 52) in her paternal grandfather; Deep vein thrombosis in her mother and sister; Heart attack in her maternal grandfather; Heart attack (age of onset: 46) in her brother; Hypertension in her father and mother; Kidney cancer (age of onset: 34)  in her cousin; Kidney cancer (age of onset: 48) in her father; Kidney cancer (age of onset: 52) in her cousin; Lung cancer (age of onset: 64) in her paternal uncle; Lung cancer (age of onset: 66) in her maternal uncle; Osteopenia in her mother; Pancreatic cancer (age of onset: 70) in her paternal uncle; Pancreatic cancer (age of onset: 54) in her paternal aunt; Pancreatic cancer (age of onset: 80) in her paternal aunt; Pneumonia in her father; Prostate cancer (age of onset: 83) in her cousin; Prostate cancer (age of onset: 56) in her maternal uncle; Uterine cancer (age of onset: 63) in her cousin; Uterine cancer (age of onset: 90) in her paternal grandmother. There is no history of Thyroid disease.  ROS:   Please see the history of present illness.     All other systems reviewed and are negative.  Prior CV studies:   The following studies were reviewed today:  CT FFR/ Cor CTA 08/20/2018: 1. Left Main: 1.0. 2. LAD: Proximal:  0.95, mid: 0.84, distal: 0.5. 3. D2: Proximal: 0.9, distal: 0.63. 4. LCX: 0.95. 5. OM1: 0.95. 6. RCA: Proximal: 0.99, mid: 0.84, distal; 0.8.  IMPRESSION: 1. CT FFR analysis showed critical stenosis in the mid LAD and in the mid portion of a small D2. A cardiac catheterization is Recommended.  Labs/Other Tests and Data Reviewed:    EKG:  No ECG reviewed.  Recent Labs: 05/22/2018: NT-Pro BNP 37 08/01/2018: BUN 20; Creatinine, Ser 0.90; Potassium 3.9; Sodium 142 10/20/2018: ALT 38   Recent Lipid Panel Lab Results  Component Value Date/Time   CHOL 190 10/20/2018 09:07 AM   TRIG 103 10/20/2018 09:07 AM   HDL 78 10/20/2018 09:07 AM   CHOLHDL 2.4 10/20/2018 09:07 AM   LDLCALC 91 10/20/2018 09:07 AM    Wt Readings from Last 3 Encounters:  12/10/18 220 lb (99.8 kg)  11/27/18 228 lb (103.4 kg)  11/21/18 227 lb 9.6 oz (103.2 kg)     Objective:    Vital Signs:  Ht 5' 6.5" (1.689 m)    Wt 220 lb (99.8 kg)    LMP 03/19/1998 Comment: h/o supracervical hysterectomy    BMI 34.98 kg/m    VITAL SIGNS:  reviewed GEN:  no acute distress RESPIRATORY:  normal respiratory effort, symmetric expansion NEURO:  alert and oriented x 3, no obvious focal deficit PSYCH:  normal affect  ASSESSMENT & PLAN:    1.  Obstructive CAD with probable stable angina: -Coronary CTA and FFR with significant LAD disease, performed 08/20/2018.  Follow-up of symptoms 10/2018 with shortness of breath resolution.  Patient was asymptomatic with exercise and not needing sublingual nitroglycerin.  Per patient preference, cardiac catheterization was not pursued. -Today she reports recently being diagnosed 11/14/2018 with costochondritis by her PCP for right-sided chest pain with radiation to her underarm and back not responsive to NSAID therapy.  She has had no associated symptoms such as diaphoresis, nausea or vomiting.  Has had some increase in dyspnea on exertion from previous evaluation.  States the pain is worse with deep inspiration and leaning forward and is nonexertional -Some concern for angina given known coronary artery disease on cardiac CTA as this may be her anginal equivalent -Also concern for PE therefore will pursue chest CT to rule out PE at this time -In the meantime we will discuss case with Dr. Tamala Julian for his recommendations on pursuing cardiac cath as the patient is now willing to proceed if necessary. -Continue current regimen for now with Toprol 25, rosuvastatin, ASA  2.  Hypertension: -Seen virtually, unable to obtain BP at this time -Continue current regimen with beta-blocker, ACE inhibitor and diuretic  3.  Hyperlipidemia: -Last LDL, 91 -LDL goal less 55 mg/DL -Continue rosuvastatin 40 mg daily -Repatha 140 mg every 2 weeks>> followed by lipid clinic  4.  Known PAD: -Secondary risk prevention -Not discussed  COVID-19 Education: The signs and symptoms of COVID-19 were discussed with the patient and how to seek care for testing (follow up with PCP or arrange  E-visit). The importance of social distancing was discussed today.  Time:   Today, I have spent 20 minutes with the patient with telehealth technology discussing the above problems.     Medication Adjustments/Labs and Tests Ordered: Current medicines are reviewed at length with the patient today.  Concerns regarding medicines are outlined above.   Tests Ordered: No orders of the defined types were placed in this encounter.   Medication Changes: No orders of the defined types were placed in this  encounter.   Follow Up:  Virtual Visit or In Person 2 weeks   Signed, Kathyrn Drown, NP  12/10/2018 10:40 AM    [place]

## 2018-12-09 NOTE — Progress Notes (Signed)
Virtual Visit via Video Note   This visit type was conducted due to national recommendations for restrictions regarding the COVID-19 Pandemic (e.g. social distancing) in an effort to limit this patient's exposure and mitigate transmission in our community.  Due to her co-morbid illnesses, this patient is at least at moderate risk for complications without adequate follow up.  This format is felt to be most appropriate for this patient at this time.  All issues noted in this document were discussed and addressed.  A limited physical exam was performed with this format.  Please refer to the patient's chart for her consent to telehealth for [facility].   Date:  12/10/2018   ID:  KESHA HURRELL, DOB 1950/04/15, MRN [id number]  Patient Location: Home Provider Location: Home  PCP:  [name], PA-C  Cardiologist:  Sinclair Grooms, MD   Evaluation Performed:  Follow-Up Visit  Chief Complaint:  Follow up for chest pain, seen for Dr. Tamala Julian   History of Present Illness:    MANON BANBURY is a 68 y.o. female with a history of CAD with significant occlusive LAD disease per coronary CTA/FFR 08/2018, hyperlipidemia, hypertension and PAD.  Patient was last seen by Dr. Tamala Julian on 11/03/2018 in follow-up after coronary CT performed with reassessment of symptoms.  She was doing well from a cardiac perspective.  Her shortness of breath had completely resolved and was not having anginal symptoms.  CT with FFR performed 08/2018 reviewed which demonstrated severe obstruction in the mid LAD and first diagonal with her right coronary and left circumflex with no evidence of ischemia.  Her exertional dyspnea was thought to be related to her significant CAD.  No cardiac catheterization was ever performed given that she was relatively asymptomatic after starting anti-ischemic and preventative therapies.  She was walking frequently without limitations and not needing nitroglycerin.  Continued medical therapy  was preferred at that time per patient.  Was recently seen by her PCP and diagnosed with costochondritis 11/14/2018 and started on NSAID therapy which has not resolved her symptoms.  Today, she states that the pain she was experiencing was right-sided upper chest discomfort that was nonexertional and worse with deep inspiration and leaning over.  She states the area is tender to palpation and will radiate at times to her underarm and her mid back area.  She has been experiencing some mild dyspnea on exertion however not at rest.  She has had no diaphoresis, nausea, vomiting or dizziness.  She reports having a family history of blood clot disorders and we talked about proceeding with testing to rule out PE.  We discussed options given her known coronary artery disease per coronary CT scan previously performed and discussed with Dr. Tamala Julian.  We spoke about the possibility of cardiac cath as this may be her anginal equivalent.  She reports taking 1 sublingual nitroglycerin yesterday evening while laying in bed with some resolution.  She states the symptoms tend to be worse at night but she cannot discern if this is because she is more focused on the symptoms at that time.  States that she has tried to continue exercising on her stationary bike with no increase in her symptoms however she reports fatigue secondary to being on methotrexate per patient report.  Plan is to discuss the possibility of cardiac catheterization with Dr. Tamala Julian and call the patient back.  She is willing to proceed if that is necessary.  The patient does not have symptoms concerning for COVID-19 infection (  fever, chills, cough, or new shortness of breath).    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     Allergies:   Bupropion, Sulfamethoxazole-trimethoprim, and Wellbutrin [bupropion hcl]   Social History   Tobacco Use   Smoking status: Former Smoker    Packs/day: 0.25    Years: 30.00    Pack years: 7.50    Types: Cigarettes    Quit date: 08/01/2017    Years since quitting: 1.3   Smokeless tobacco: Never Used   Tobacco comment: using nicoderm patches  Substance Use Topics   Alcohol use: Not Currently   Drug use: No     Family Hx: The patient's family history includes Allergies in her brother; Alzheimer's disease in her mother; Brain cancer in her paternal aunt; Breast cancer (age of onset: 67) in her sister; Breast cancer (age of onset: 8) in her sister; Breast cancer (age of onset: 34) in her sister; Breast cancer (age of onset: 5) in her cousin; Breast cancer (age of onset: 20) in her maternal aunt; Clotting disorder in her sister; Colon cancer (age of onset: 52) in her paternal grandfather; Deep vein thrombosis in her mother and sister; Heart attack in her maternal grandfather; Heart attack (age of onset: 46) in her brother; Hypertension in her father and mother; Kidney cancer (age of onset: 34)  in her cousin; Kidney cancer (age of onset: 48) in her father; Kidney cancer (age of onset: 52) in her cousin; Lung cancer (age of onset: 64) in her paternal uncle; Lung cancer (age of onset: 66) in her maternal uncle; Osteopenia in her mother; Pancreatic cancer (age of onset: 70) in her paternal uncle; Pancreatic cancer (age of onset: 54) in her paternal aunt; Pancreatic cancer (age of onset: 80) in her paternal aunt; Pneumonia in her father; Prostate cancer (age of onset: 83) in her cousin; Prostate cancer (age of onset: 56) in her maternal uncle; Uterine cancer (age of onset: 63) in her cousin; Uterine cancer (age of onset: 90) in her paternal grandmother. There is no history of Thyroid disease.  ROS:   Please see the history of present illness.     All other systems reviewed and are negative.  Prior CV studies:   The following studies were reviewed today:  CT FFR/ Cor CTA 08/20/2018: 1. Left Main: 1.0. 2. LAD: Proximal:  0.95, mid: 0.84, distal: 0.5. 3. D2: Proximal: 0.9, distal: 0.63. 4. LCX: 0.95. 5. OM1: 0.95. 6. RCA: Proximal: 0.99, mid: 0.84, distal; 0.8.  IMPRESSION: 1. CT FFR analysis showed critical stenosis in the mid LAD and in the mid portion of a small D2. A cardiac catheterization is Recommended.  Labs/Other Tests and Data Reviewed:    EKG:  No ECG reviewed.  Recent Labs: 05/22/2018: NT-Pro BNP 37 08/01/2018: BUN 20; Creatinine, Ser 0.90; Potassium 3.9; Sodium 142 10/20/2018: ALT 38   Recent Lipid Panel Lab Results  Component Value Date/Time   CHOL 190 10/20/2018 09:07 AM   TRIG 103 10/20/2018 09:07 AM   HDL 78 10/20/2018 09:07 AM   CHOLHDL 2.4 10/20/2018 09:07 AM   LDLCALC 91 10/20/2018 09:07 AM    Wt Readings from Last 3 Encounters:  12/10/18 220 lb (99.8 kg)  11/27/18 228 lb (103.4 kg)  11/21/18 227 lb 9.6 oz (103.2 kg)     Objective:    Vital Signs:  Ht 5' 6.5" (1.689 m)    Wt 220 lb (99.8 kg)    LMP 03/19/1998 Comment: h/o supracervical hysterectomy    BMI 34.98 kg/m    VITAL SIGNS:  reviewed GEN:  no acute distress RESPIRATORY:  normal respiratory effort, symmetric expansion NEURO:  alert and oriented x 3, no obvious focal deficit PSYCH:  normal affect  ASSESSMENT & PLAN:    1.  Obstructive CAD with probable stable angina: -Coronary CTA and FFR with significant LAD disease, performed 08/20/2018.  Follow-up of symptoms 10/2018 with shortness of breath resolution.  Patient was asymptomatic with exercise and not needing sublingual nitroglycerin.  Per patient preference, cardiac catheterization was not pursued. -Today she reports recently being diagnosed 11/14/2018 with costochondritis by her PCP for right-sided chest pain with radiation to her underarm and back not responsive to NSAID therapy.  She has had no associated symptoms such as diaphoresis, nausea or vomiting.  Has had some increase in dyspnea on exertion from previous evaluation.  States the pain is worse with deep inspiration and leaning forward and is nonexertional -Some concern for angina given known coronary artery disease on cardiac CTA as this may be her anginal equivalent -Also concern for PE therefore will pursue chest CT to rule out PE at this time -In the meantime we will discuss case with Dr. Tamala Julian for his recommendations on pursuing cardiac cath as the patient is now willing to proceed if necessary. -Continue current regimen for now with Toprol 25, rosuvastatin, ASA  2.  Hypertension: -Seen virtually, unable to obtain BP at this time -Continue current regimen with beta-blocker, ACE inhibitor and diuretic  3.  Hyperlipidemia: -Last LDL, 91 -LDL goal less 55 mg/DL -Continue rosuvastatin 40 mg daily -Repatha 140 mg every 2 weeks>> followed by lipid clinic  4.  Known PAD: -Secondary risk prevention -Not discussed  COVID-19 Education: The signs and symptoms of COVID-19 were discussed with the patient and how to seek care for testing (follow up with PCP or arrange  E-visit). The importance of social distancing was discussed today.  Time:   Today, I have spent 20 minutes with the patient with telehealth technology discussing the above problems.     Medication Adjustments/Labs and Tests Ordered: Current medicines are reviewed at length with the patient today.  Concerns regarding medicines are outlined above.   Tests Ordered: No orders of the defined types were placed in this encounter.   Medication Changes: No orders of the defined types were placed in this  encounter.   Follow Up:  Virtual Visit or In Person 2 weeks   Signed, Kathyrn Drown, NP  12/10/2018 10:40 AM    [place]

## 2018-12-10 ENCOUNTER — Telehealth: Payer: Self-pay

## 2018-12-10 ENCOUNTER — Telehealth (INDEPENDENT_AMBULATORY_CARE_PROVIDER_SITE_OTHER): Payer: Medicare Other | Admitting: Cardiology

## 2018-12-10 ENCOUNTER — Other Ambulatory Visit: Payer: Self-pay | Admitting: Cardiology

## 2018-12-10 ENCOUNTER — Encounter: Payer: Self-pay | Admitting: Cardiology

## 2018-12-10 ENCOUNTER — Other Ambulatory Visit: Payer: Self-pay

## 2018-12-10 ENCOUNTER — Other Ambulatory Visit: Payer: Medicare Other | Admitting: *Deleted

## 2018-12-10 VITALS — Ht 66.5 in | Wt 220.0 lb

## 2018-12-10 DIAGNOSIS — I2699 Other pulmonary embolism without acute cor pulmonale: Secondary | ICD-10-CM | POA: Diagnosis not present

## 2018-12-10 DIAGNOSIS — H209 Unspecified iridocyclitis: Secondary | ICD-10-CM | POA: Diagnosis not present

## 2018-12-10 DIAGNOSIS — Z01818 Encounter for other preprocedural examination: Secondary | ICD-10-CM | POA: Diagnosis not present

## 2018-12-10 DIAGNOSIS — H4041X3 Glaucoma secondary to eye inflammation, right eye, severe stage: Secondary | ICD-10-CM | POA: Diagnosis not present

## 2018-12-10 DIAGNOSIS — H4042X2 Glaucoma secondary to eye inflammation, left eye, moderate stage: Secondary | ICD-10-CM | POA: Diagnosis not present

## 2018-12-10 LAB — BASIC METABOLIC PANEL
BUN/Creatinine Ratio: 18 (ref 12–28)
BUN: 19 mg/dL (ref 8–27)
CO2: 27 mmol/L (ref 20–29)
Calcium: 10.4 mg/dL — ABNORMAL HIGH (ref 8.7–10.3)
Chloride: 109 mmol/L — ABNORMAL HIGH (ref 96–106)
Creatinine, Ser: 1.04 mg/dL — ABNORMAL HIGH (ref 0.57–1.00)
GFR calc Af Amer: 64 mL/min/{1.73_m2} (ref >59)
GFR calc non Af Amer: 56 mL/min/{1.73_m2} — ABNORMAL LOW (ref >59)
Glucose: 130 mg/dL — ABNORMAL HIGH (ref 65–99)
Potassium: 4.3 mmol/L (ref 3.5–5.2)
Sodium: 147 mmol/L — ABNORMAL HIGH (ref 134–144)

## 2018-12-10 NOTE — Addendum Note (Signed)
Addended by: Jacinta Shoe on: 12/10/2018 11:43 AM   Modules accepted: Orders

## 2018-12-10 NOTE — Addendum Note (Signed)
Addended by: Jacinta Shoe on: 12/10/2018 12:54 PM   Modules accepted: Orders

## 2018-12-10 NOTE — Telephone Encounter (Signed)

## 2018-12-10 NOTE — Patient Instructions (Addendum)
Medication Instructions:  Your physician recommends that you continue on your current medications as directed. Please refer to the Current Medication list given to you today. If you need a refill on your cardiac medications before your next appointment, please call your pharmacy.   Lab work: Labs to be done: BMET, CBC  If you have labs (blood work) drawn today and your tests are completely normal, you will receive your results only by: [name] MyChart Message (if you have MyChart) OR . A paper copy in the mail If you have any lab test that is abnormal or we need to change your treatment, we will call you to review the results.  Testing/Procedures: Non-Cardiac CT scanning, (CAT scanning), is a noninvasive, special x-ray that produces cross-sectional images of the body using x-rays and a computer. CT scans help physicians diagnose and treat medical conditions. For some CT exams, a contrast material is used to enhance visibility in the area of the body being studied. CT scans provide greater clarity and reveal more details than regular x-ray exams.  Your physician has requested that you have a cardiac catheterization. Cardiac catheterization is used to diagnose and/or treat various heart conditions. Doctors may recommend this procedure for a number of different reasons. The most common reason is to evaluate chest pain. Chest pain can be a symptom of coronary artery disease (CAD), and cardiac catheterization can show whether plaque is narrowing or blocking your heart's arteries. This procedure is also used to evaluate the valves, as well as measure the blood flow and oxygen levels in different parts of your heart. For further information please visit [employer]. Please follow instruction sheet, as given.  Follow-Up: At [facility], you and your health needs are our priority.  As part of our continuing mission to provide you with exceptional heart care, we have created designated Provider Care  Teams.  These Care Teams include your primary Cardiologist (physician) and Advanced Practice Providers (APPs -  Physician Assistants and Nurse Practitioners) who all work together to provide you with the care you need, when you need it. You will need a follow up appointment in 2 weeks.  Please call our office 2 months in advance to schedule this appointment.  You may see Sinclair Grooms, MD or one of the following Advanced Practice Providers on your designated Care Team:   [name], NP Cecilie Kicks, NP . Kathyrn Drown, NP     [facility] [place], [place] [address] Dept: [phone] Loc: [place]  12/10/2018  You are scheduled for a Cardiac Catheterization on Wednesday, September 30 with Dr. Daneen Schick.  1. Please arrive at the [facility] (Main Entrance A) at [facility]: [address] [place], [place] [address] at 6:30 AM (This time is two hours before your procedure to ensure your preparation). Free valet parking service is available.   Special note: Every effort is made to have your procedure done on time. Please understand that emergencies sometimes delay scheduled procedures.  2. Diet: Do not eat solid foods after midnight.  The patient may have clear liquids until 5am upon the day of the procedure.  3. Labs: You will need to have blood drawn on Wednesday, September 23 at [facility]. [address]  Open: 7:30am - 5pm    Phone: [phone]. You do not need to be fasting.  4. Medication instructions in preparation for your procedure   On the morning of your procedure, take your Aspirin and any morning medicines NOT listed above.  You may use sips of water.  5. Plan for one night stay--bring personal belongings. 6. Bring a current list of your medications and current insurance cards. 7. You  MUST have a responsible person to drive you home. 8. Someone MUST be with you the first 24 hours after you arrive home or your discharge will be delayed. 9. Please wear clothes that are easy to get on and off and wear slip-on shoes.  Thank you for allowing [place] to care for you!   -- [facility]

## 2018-12-11 ENCOUNTER — Telehealth: Payer: Self-pay

## 2018-12-11 ENCOUNTER — Other Ambulatory Visit: Payer: Self-pay

## 2018-12-11 ENCOUNTER — Ambulatory Visit (INDEPENDENT_AMBULATORY_CARE_PROVIDER_SITE_OTHER)
Admission: RE | Admit: 2018-12-11 | Discharge: 2018-12-11 | Disposition: A | Discharge status: Home / Self Care | Payer: Medicare Other | Source: Ambulatory Visit | Attending: Cardiology | Admitting: Cardiology

## 2018-12-11 ENCOUNTER — Other Ambulatory Visit (INDEPENDENT_AMBULATORY_CARE_PROVIDER_SITE_OTHER): Payer: Medicare Other | Admitting: Nurse Practitioner

## 2018-12-11 VITALS — BP 120/78 | HR 60 | Ht 66.5 in | Wt 230.0 lb

## 2018-12-11 DIAGNOSIS — I25118 Atherosclerotic heart disease of native coronary artery with other forms of angina pectoris: Secondary | ICD-10-CM | POA: Diagnosis not present

## 2018-12-11 DIAGNOSIS — R0602 Shortness of breath: Secondary | ICD-10-CM | POA: Diagnosis not present

## 2018-12-11 DIAGNOSIS — I2699 Other pulmonary embolism without acute cor pulmonale: Secondary | ICD-10-CM

## 2018-12-11 MED ORDER — IOHEXOL 350 MG/ML SOLN
80.0000 mL | Freq: Once | INTRAVENOUS | Status: AC | PRN
  Administered 2018-12-11: 15:00:00 80 mL via INTRAVENOUS

## 2018-12-11 NOTE — Patient Instructions (Signed)
Medication Instructions:  Your physician recommends that you continue on your current medications as directed. Please refer to the Current Medication list given to you today.  If you need a refill on your cardiac medications before your next appointment, please call your pharmacy.   Lab work: None Ordered    Testing/Procedures: None Ordered   Follow-Up: At [facility], you and your health needs are our priority.  As part of our continuing mission to provide you with exceptional heart care, we have created designated Provider Care Teams.  These Care Teams include your primary Cardiologist (physician) and Advanced Practice Providers (APPs -  Physician Assistants and Nurse Practitioners) who all work together to provide you with the care you need, when you need it. You will need a follow up appointment in  As directed after heart catheterization.   You may see Sinclair Grooms, MD or one of the following Advanced Practice Providers on your designated Care Team:   [name], NP Cecilie Kicks, NP . Kathyrn Drown, NP

## 2018-12-11 NOTE — Progress Notes (Signed)
1.) Reason for visit: Patient here for ECG prior to cardiac catheterization scheduled on 9/30. Pt aler & oriented to person, place, time, event  2.) Name of MD requesting visit: Kathyrn Drown, NP  3.) H&P: Pt presents for ECG, recent diagnosis of probable stable angina with CAD seen on coronary CT  4.) ROS related to problem: Pt denies complaints. Questions were answered to patient's satisfaction and she was in no apparent distress. She was taken to CT department for appointment.  5.) Assessment and plan per MD: ECG reviewed by Dr. Lovena Le, DOD who advised it looks good and pt may continue current plan of care

## 2018-12-11 NOTE — Telephone Encounter (Signed)
Notes recorded by Frederik Schmidt, RN on 12/11/2018 at 1:36 PM EDT  The patient has been notified of the result and verbalized understanding. All questions (if any) were answered.  Frederik Schmidt, RN 12/11/2018 1:36 PM

## 2018-12-12 NOTE — Telephone Encounter (Signed)
Per Epic notes, patient is currently being evaluated for right side chest pain/ possible PE and cardiac cath is scheduled for 12-17-18.  Will defer one month unless patient calls.  Routing to Dr [name]. Encounter closed.   CC: Pamela Lowe.

## 2018-12-13 ENCOUNTER — Other Ambulatory Visit (HOSPITAL_COMMUNITY)
Admission: RE | Admit: 2018-12-13 | Discharge: 2018-12-13 | Disposition: A | Discharge status: Home / Self Care | Payer: Medicare Other | Source: Ambulatory Visit | Attending: Interventional Cardiology | Admitting: Interventional Cardiology

## 2018-12-13 DIAGNOSIS — Z01812 Encounter for preprocedural laboratory examination: Secondary | ICD-10-CM | POA: Diagnosis not present

## 2018-12-13 DIAGNOSIS — Z20828 Contact with and (suspected) exposure to other viral communicable diseases: Secondary | ICD-10-CM | POA: Diagnosis not present

## 2018-12-14 LAB — NOVEL CORONAVIRUS, NAA (HOSP ORDER, SEND-OUT TO REF LAB; TAT 18-24 HRS): SARS-CoV-2, NAA: NOT DETECTED

## 2018-12-16 ENCOUNTER — Telehealth: Payer: Self-pay | Admitting: *Deleted

## 2018-12-16 NOTE — Telephone Encounter (Signed)
Pt contacted pre-catheterization scheduled at [facility] for: Wednesday December 17, 2018 8:30 AM Verified arrival time and place: [place]) at: 6:30 AM   No solid food after midnight prior to cath, clear liquids until 5 AM day of procedure. Contrast allergy: no  Hold: HCTZ-AM of procedure.  Except hold medications AM meds can be  taken pre-cath with sip of water including: ASA 81 mg   Confirmed patient has responsible person to drive home post procedure and observe 24 hours after arriving home: yes  Currently, due to Covid-19 pandemic, only one support person will be allowed with patient. Must be the same support person for that patient's entire stay, will be screened and required to wear a mask. They will be asked to wait in the waiting room for the duration of the patient's stay.  Patients are required to wear a mask when they enter the hospital.      COVID-19 Pre-Screening Questions:  . In the past 7 to 10 days have you had a cough,  shortness of breath, headache, congestion, fever (100 or greater) body aches, chills, sore throat, or sudden loss of taste or sense of smell? no . Have you been around anyone with known Covid 19? no . Have you been around anyone who is awaiting Covid 19 test results in the past 7 to 10 days? no . Have you been around anyone who has been exposed to Covid 19, or has mentioned symptoms of Covid 19 within the past 7 to 10 days? no    I reviewed procedure/mask/visitor instructions, Covid-19 screening questions with patient, she verbalized understanding, thanked me for call.  Per [employer] Office lab-CBC not done 12/10/18-I will order to be done on arrival at hospital AM of procedure, pt aware via voice mail message.

## 2018-12-17 ENCOUNTER — Encounter (HOSPITAL_COMMUNITY)
Admission: RE | Disposition: A | Discharge status: Home / Self Care | Payer: Medicare Other | Source: Home / Self Care | Attending: Interventional Cardiology

## 2018-12-17 ENCOUNTER — Other Ambulatory Visit: Payer: Self-pay

## 2018-12-17 ENCOUNTER — Ambulatory Visit (HOSPITAL_COMMUNITY)
Admission: RE | Admit: 2018-12-17 | Discharge: 2018-12-17 | Disposition: A | Discharge status: Home / Self Care | Payer: Medicare Other | Attending: Interventional Cardiology | Admitting: Interventional Cardiology

## 2018-12-17 ENCOUNTER — Ambulatory Visit: Payer: Medicare Other | Admitting: Cardiology

## 2018-12-17 ENCOUNTER — Encounter (HOSPITAL_COMMUNITY): Payer: Self-pay | Admitting: Interventional Cardiology

## 2018-12-17 DIAGNOSIS — I739 Peripheral vascular disease, unspecified: Secondary | ICD-10-CM | POA: Diagnosis not present

## 2018-12-17 DIAGNOSIS — Z881 Allergy status to other antibiotic agents status: Secondary | ICD-10-CM | POA: Diagnosis not present

## 2018-12-17 DIAGNOSIS — E785 Hyperlipidemia, unspecified: Secondary | ICD-10-CM | POA: Diagnosis not present

## 2018-12-17 DIAGNOSIS — Z87891 Personal history of nicotine dependence: Secondary | ICD-10-CM | POA: Diagnosis not present

## 2018-12-17 DIAGNOSIS — I700 Atherosclerosis of aorta: Secondary | ICD-10-CM | POA: Diagnosis present

## 2018-12-17 DIAGNOSIS — I10 Essential (primary) hypertension: Secondary | ICD-10-CM | POA: Diagnosis not present

## 2018-12-17 DIAGNOSIS — Z8249 Family history of ischemic heart disease and other diseases of the circulatory system: Secondary | ICD-10-CM | POA: Diagnosis not present

## 2018-12-17 DIAGNOSIS — I2582 Chronic total occlusion of coronary artery: Secondary | ICD-10-CM | POA: Insufficient documentation

## 2018-12-17 DIAGNOSIS — I25118 Atherosclerotic heart disease of native coronary artery with other forms of angina pectoris: Secondary | ICD-10-CM | POA: Diagnosis not present

## 2018-12-17 DIAGNOSIS — K219 Gastro-esophageal reflux disease without esophagitis: Secondary | ICD-10-CM | POA: Diagnosis not present

## 2018-12-17 DIAGNOSIS — R0600 Dyspnea, unspecified: Secondary | ICD-10-CM | POA: Diagnosis present

## 2018-12-17 DIAGNOSIS — R0609 Other forms of dyspnea: Secondary | ICD-10-CM | POA: Diagnosis present

## 2018-12-17 DIAGNOSIS — E782 Mixed hyperlipidemia: Secondary | ICD-10-CM | POA: Diagnosis present

## 2018-12-17 DIAGNOSIS — I209 Angina pectoris, unspecified: Secondary | ICD-10-CM

## 2018-12-17 HISTORY — PX: LEFT HEART CATH AND CORONARY ANGIOGRAPHY: CATH118249

## 2018-12-17 LAB — CBC
HCT: 36.8 % (ref 36.0–46.0)
Hemoglobin: 12.3 g/dL (ref 12.0–15.0)
MCH: 31.2 pg (ref 26.0–34.0)
MCHC: 33.4 g/dL (ref 30.0–36.0)
MCV: 93.4 fL (ref 80.0–100.0)
Platelets: 263 10*3/uL (ref 150–400)
RBC: 3.94 MIL/uL (ref 3.87–5.11)
RDW: 15.2 % (ref 11.5–15.5)
WBC: 6.2 10*3/uL (ref 4.0–10.5)
nRBC: 0 % (ref 0.0–0.2)

## 2018-12-17 SURGERY — LEFT HEART CATH AND CORONARY ANGIOGRAPHY
Anesthesia: LOCAL

## 2018-12-17 MED ORDER — SODIUM CHLORIDE 0.9% FLUSH: 3.0000 mL | INTRAVENOUS | Status: DC | PRN

## 2018-12-17 MED ORDER — HEPARIN SODIUM (PORCINE) 1000 UNIT/ML IJ SOLN
INTRAMUSCULAR | Status: DC | PRN
  Administered 2018-12-17: 5000 [IU] via INTRAVENOUS

## 2018-12-17 MED ORDER — ACETAMINOPHEN 325 MG PO TABS: 650.0000 mg | ORAL_TABLET | ORAL | Status: DC | PRN

## 2018-12-17 MED ORDER — LIDOCAINE HCL (PF) 1 % IJ SOLN
INTRAMUSCULAR | Status: AC
  Filled 2018-12-17: qty 30

## 2018-12-17 MED ORDER — HYDRALAZINE HCL 20 MG/ML IJ SOLN: 10.0000 mg | INTRAMUSCULAR | Status: DC | PRN

## 2018-12-17 MED ORDER — FENTANYL CITRATE (PF) 100 MCG/2ML IJ SOLN
INTRAMUSCULAR | Status: AC
  Filled 2018-12-17: qty 2

## 2018-12-17 MED ORDER — ASPIRIN 81 MG PO CHEW
81.0000 mg | CHEWABLE_TABLET | ORAL | Status: AC
  Administered 2018-12-17: 81 mg via ORAL
  Filled 2018-12-17: qty 1

## 2018-12-17 MED ORDER — SODIUM CHLORIDE 0.9% FLUSH: 3.0000 mL | Freq: Two times a day (BID) | INTRAVENOUS | Status: DC

## 2018-12-17 MED ORDER — HEPARIN SODIUM (PORCINE) 1000 UNIT/ML IJ SOLN
INTRAMUSCULAR | Status: AC
  Filled 2018-12-17: qty 1

## 2018-12-17 MED ORDER — VERAPAMIL HCL 2.5 MG/ML IV SOLN
INTRAVENOUS | Status: AC
  Filled 2018-12-17: qty 2

## 2018-12-17 MED ORDER — SODIUM CHLORIDE 0.9 % IV SOLN: 250.0000 mL | INTRAVENOUS | Status: DC | PRN

## 2018-12-17 MED ORDER — FENTANYL CITRATE (PF) 100 MCG/2ML IJ SOLN
INTRAMUSCULAR | Status: DC | PRN
  Administered 2018-12-17: 25 ug via INTRAVENOUS

## 2018-12-17 MED ORDER — SODIUM CHLORIDE 0.9 % WEIGHT BASED INFUSION
3.0000 mL/kg/h | INTRAVENOUS | Status: AC
  Administered 2018-12-17: 3 mL/kg/h via INTRAVENOUS

## 2018-12-17 MED ORDER — SODIUM CHLORIDE 0.9 % IV SOLN: INTRAVENOUS | Status: DC

## 2018-12-17 MED ORDER — HEPARIN (PORCINE) IN NACL 1000-0.9 UT/500ML-% IV SOLN
INTRAVENOUS | Status: DC | PRN
  Administered 2018-12-17 (×2): 500 mL

## 2018-12-17 MED ORDER — LABETALOL HCL 5 MG/ML IV SOLN: 10.0000 mg | INTRAVENOUS | Status: DC | PRN

## 2018-12-17 MED ORDER — NITROGLYCERIN 1 MG/10 ML FOR IR/CATH LAB
INTRA_ARTERIAL | Status: DC | PRN
  Administered 2018-12-17: 200 ug via INTRACORONARY

## 2018-12-17 MED ORDER — SODIUM CHLORIDE 0.9 % WEIGHT BASED INFUSION: 1.0000 mL/kg/h | INTRAVENOUS | Status: DC

## 2018-12-17 MED ORDER — HEPARIN (PORCINE) IN NACL 1000-0.9 UT/500ML-% IV SOLN
INTRAVENOUS | Status: AC
  Filled 2018-12-17: qty 1000

## 2018-12-17 MED ORDER — OXYCODONE HCL 5 MG PO TABS: 5.0000 mg | ORAL_TABLET | ORAL | Status: DC | PRN

## 2018-12-17 MED ORDER — ONDANSETRON HCL 4 MG/2ML IJ SOLN: 4.0000 mg | Freq: Four times a day (QID) | INTRAMUSCULAR | Status: DC | PRN

## 2018-12-17 MED ORDER — MIDAZOLAM HCL 2 MG/2ML IJ SOLN
INTRAMUSCULAR | Status: DC | PRN
  Administered 2018-12-17: 1 mg via INTRAVENOUS

## 2018-12-17 MED ORDER — ASPIRIN 81 MG PO CHEW: 81.0000 mg | CHEWABLE_TABLET | Freq: Every day | ORAL | Status: DC

## 2018-12-17 MED ORDER — MIDAZOLAM HCL 2 MG/2ML IJ SOLN
INTRAMUSCULAR | Status: AC
  Filled 2018-12-17: qty 2

## 2018-12-17 MED ORDER — LIDOCAINE HCL (PF) 1 % IJ SOLN
INTRAMUSCULAR | Status: DC | PRN
  Administered 2018-12-17: 3 mL

## 2018-12-17 MED ORDER — VERAPAMIL HCL 2.5 MG/ML IV SOLN
INTRAVENOUS | Status: DC | PRN
  Administered 2018-12-17: 09:00:00 10 mL via INTRA_ARTERIAL

## 2018-12-17 MED ORDER — NITROGLYCERIN 1 MG/10 ML FOR IR/CATH LAB
INTRA_ARTERIAL | Status: AC
  Filled 2018-12-17: qty 10

## 2018-12-17 MED ORDER — IOHEXOL 350 MG/ML SOLN
INTRAVENOUS | Status: DC | PRN
  Administered 2018-12-17: 105 mL

## 2018-12-17 SURGICAL SUPPLY — 10 items
CATH 5FR JL3.5 JR4 ANG PIG MP (CATHETERS) ×2 IMPLANT
DEVICE RAD COMP TR BAND LRG (VASCULAR PRODUCTS) ×2 IMPLANT
GLIDESHEATH SLEND A-KIT 6F 22G (SHEATH) ×2 IMPLANT
GUIDEWIRE INQWIRE 1.5J.035X260 (WIRE) ×1 IMPLANT
INQWIRE 1.5J .035X260CM (WIRE) ×2
KIT HEART LEFT (KITS) ×2 IMPLANT
PACK CARDIAC CATHETERIZATION (CUSTOM PROCEDURE TRAY) ×2 IMPLANT
TRANSDUCER W/STOPCOCK (MISCELLANEOUS) ×2 IMPLANT
TUBING CIL FLEX 10 FLL-RA (TUBING) ×2 IMPLANT
WIRE HI TORQ VERSACORE-J 145CM (WIRE) ×2 IMPLANT

## 2018-12-17 NOTE — Discharge Instructions (Signed)
Radial Site Care ° °This sheet gives you information about how to care for yourself after your procedure. Your health care provider may also give you more specific instructions. If you have problems or questions, contact your health care provider. °What can I expect after the procedure? °After the procedure, it is common to have: °· Bruising and tenderness at the catheter insertion area. °Follow these instructions at home: °Medicines °· Take over-the-counter and prescription medicines only as told by your health care provider. °Insertion site care °· Follow instructions from your health care provider about how to take care of your insertion site. Make sure you: °? Wash your hands with soap and water before you change your bandage (dressing). If soap and water are not available, use hand sanitizer. °? Change your dressing as told by your health care provider. °? Leave stitches (sutures), skin glue, or adhesive strips in place. These skin closures may need to stay in place for 2 weeks or longer. If adhesive strip edges start to loosen and curl up, you may trim the loose edges. Do not remove adhesive strips completely unless your health care provider tells you to do that. °· Check your insertion site every day for signs of infection. Check for: °? Redness, swelling, or pain. °? Fluid or blood. °? Pus or a bad smell. °? Warmth. °· Do not take baths, swim, or use a hot tub until your health care provider approves. °· You may shower 24-48 hours after the procedure, or as directed by your health care provider. °? Remove the dressing and gently wash the site with plain soap and water. °? Pat the area dry with a clean towel. °? Do not rub the site. That could cause bleeding. °· Do not apply powder or lotion to the site. °Activity ° °· For 24 hours after the procedure, or as directed by your health care provider: °? Do not flex or bend the affected arm. °? Do not push or pull heavy objects with the affected arm. °? Do not  drive yourself home from the hospital or clinic. You may drive 24 hours after the procedure unless your health care provider tells you not to. °? Do not operate machinery or power tools. °· Do not lift anything that is heavier than 10 lb (4.5 kg), or the limit that you are told, until your health care provider says that it is safe. °· Ask your health care provider when it is okay to: °? Return to work or school. °? Resume usual physical activities or sports. °? Resume sexual activity. °General instructions °· If the catheter site starts to bleed, raise your arm and put firm pressure on the site. If the bleeding does not stop, get help right away. This is a medical emergency. °· If you went home on the same day as your procedure, a responsible adult should be with you for the first 24 hours after you arrive home. °· Keep all follow-up visits as told by your health care provider. This is important. °Contact a health care provider if: °· You have a fever. °· You have redness, swelling, or yellow drainage around your insertion site. °Get help right away if: °· You have unusual pain at the radial site. °· The catheter insertion area swells very fast. °· The insertion area is bleeding, and the bleeding does not stop when you hold steady pressure on the area. °· Your arm or hand becomes pale, cool, tingly, or numb. °These symptoms may represent a serious problem   that is an emergency. Do not wait to see if the symptoms will go away. Get medical help right away. Call your local emergency services (911 in the [place] Do not drive yourself to the hospital. °Summary °· After the procedure, it is common to have bruising and tenderness at the site. °· Follow instructions from your health care provider about how to take care of your radial site wound. Check the wound every day for signs of infection. °· Do not lift anything that is heavier than 10 lb (4.5 kg), or the limit that you are told, until your health care provider says  that it is safe. °This information is not intended to replace advice given to you by your health care provider. Make sure you discuss any questions you have with your health care provider. °Document Released: 04/07/2010 Document Revised: 04/10/2017 Document Reviewed: 04/10/2017 °Elsevier Patient Education © 2020 [employer] ° °

## 2018-12-17 NOTE — Interval H&P Note (Signed)
Cath Lab Visit (complete for each Cath Lab visit)  Clinical Evaluation Leading to the Procedure:   ACS: Yes.    Non-ACS:    Anginal Classification: CCS III  Anti-ischemic medical therapy: Maximal Therapy (2 or more classes of medications)  Non-Invasive Test Results: Intermediate-risk stress test findings: cardiac mortality 1-3%/year  Prior CABG: No previous CABG      History and Physical Interval Note:  12/17/2018 8:13 AM  Pamela Lowe  has presented today for surgery, with the diagnosis of Chest pain.  The various methods of treatment have been discussed with the patient and family. After consideration of risks, benefits and other options for treatment, the patient has consented to  Procedure(s): LEFT HEART CATH AND CORONARY ANGIOGRAPHY (N/A) as a surgical intervention.  The patient's history has been reviewed, patient examined, no change in status, stable for surgery.  I have reviewed the patient's chart and labs.  Questions were answered to the patient's satisfaction.     Belva Crome III

## 2018-12-17 NOTE — CV Procedure (Signed)
   Left heart cath with coronary angiography via right radial using real-time vascular ultrasound for access.  Acute angulation between the subclavian and innominate artery compromise catheter control from the radial approach.  Widely patent left main.  Totally occluded LAD in the mid segment beyond 2 large diagonals.  Distal LAD is supplied by collaterals from the right coronary.  Second diagonal contains mid vessel 90% stenosis.  First diagonal contains 50% stenosis.  Circumflex is large and free of disease.  RCA contains diffuse luminal irregularities throughout the mid segment and supplies collaterals via the apex to the distal LAD.  Normal LV function and end-diastolic pressure.  Decided against PCI today and will determine revascularization approach.  Options include dilatation and stenting of the diagonal and leaving the LAD totally occluded to avoid stenting over the second diagonal ostium and a large septal perforator.  Another interventional option would be to use a long stent and treat from the mid LAD above disease through the total occlusion.  This would jail the second diagonal and large septal perforator and make access to the second diagonal difficult.  Third option would be coronary bypass grafting to the LAD, second diagonal, and possibly first diagonal.  Also some consideration of right coronary bypass grafting since FFR CT demonstrates distal right coronary FFR of 0.8

## 2018-12-18 DIAGNOSIS — M0589 Other rheumatoid arthritis with rheumatoid factor of multiple sites: Secondary | ICD-10-CM | POA: Diagnosis not present

## 2018-12-19 ENCOUNTER — Other Ambulatory Visit: Payer: Self-pay | Admitting: *Deleted

## 2018-12-19 ENCOUNTER — Telehealth: Payer: Self-pay | Admitting: Interventional Cardiology

## 2018-12-19 ENCOUNTER — Encounter: Payer: Self-pay | Admitting: *Deleted

## 2018-12-19 ENCOUNTER — Telehealth: Payer: Self-pay | Admitting: Acute Care

## 2018-12-19 DIAGNOSIS — Z87891 Personal history of nicotine dependence: Secondary | ICD-10-CM

## 2018-12-19 DIAGNOSIS — Z122 Encounter for screening for malignant neoplasm of respiratory organs: Secondary | ICD-10-CM

## 2018-12-19 MED ORDER — PANTOPRAZOLE SODIUM 40 MG PO TBEC: 40.0000 mg | DELAYED_RELEASE_TABLET | Freq: Every day | ORAL | 3 refills | Status: DC

## 2018-12-19 MED ORDER — CLOPIDOGREL BISULFATE 75 MG PO TABS: 75.0000 mg | ORAL_TABLET | Freq: Every day | ORAL | 3 refills | Status: DC

## 2018-12-19 NOTE — Telephone Encounter (Signed)
Dr. Tamala Julian ordered for pt to have cath on 12/26/2018 to place stent in LAD/diag that was seen on previous cath 9/30.  Spoke with pt and went over instructions.  She will go for COVID testing on 12/24/2018.  Dr. Tamala Julian also wanted to go ahead and start pt on Plavix 75mg  QD.  Sent to pt's preferred pharmacy. Noticed pt was prescribed Omeprazole BID for reflux.  Pt states she takes one capsule every morning and occasionally takes a second one with episodes of reflux.  Advised I will speak with Dr. Tamala Julian and call back but will likely need to change to Protonix 40mg  QD.  Spoke with Dr. Tamala Julian and he did say to switch to Protonix.  Called pt and left detailed message on confirmed VM about this change.  Advised to call back if any questions.

## 2018-12-21 ENCOUNTER — Encounter: Payer: Self-pay | Admitting: Family Medicine

## 2018-12-23 NOTE — Telephone Encounter (Signed)
LMTC x 1  

## 2018-12-23 NOTE — Telephone Encounter (Signed)
Spoke with pt and scheduled SDMV 01/12/19 2:30 CT ordered Nothing further needed

## 2018-12-24 ENCOUNTER — Ambulatory Visit: Payer: Medicare Other | Admitting: Interventional Cardiology

## 2018-12-24 ENCOUNTER — Other Ambulatory Visit (HOSPITAL_COMMUNITY)
Admission: RE | Admit: 2018-12-24 | Discharge: 2018-12-24 | Disposition: A | Discharge status: Home / Self Care | Payer: Medicare Other | Source: Ambulatory Visit | Attending: Interventional Cardiology | Admitting: Interventional Cardiology

## 2018-12-24 DIAGNOSIS — Z01812 Encounter for preprocedural laboratory examination: Secondary | ICD-10-CM | POA: Insufficient documentation

## 2018-12-24 DIAGNOSIS — Z20828 Contact with and (suspected) exposure to other viral communicable diseases: Secondary | ICD-10-CM | POA: Insufficient documentation

## 2018-12-25 ENCOUNTER — Telehealth: Payer: Self-pay | Admitting: *Deleted

## 2018-12-25 LAB — SARS CORONAVIRUS 2 (TAT 6-24 HRS): SARS Coronavirus 2: NEGATIVE

## 2018-12-25 NOTE — Telephone Encounter (Signed)
Pt contacted pre-catheterization scheduled at [facility] for: Friday December 26, 2018 10:30 AM Verified arrival time and place: [facility]) at: 8:30 AM   No solid food after midnight prior to cath, clear liquids until 5 AM day of procedure. Contrast allergy: no  Hold: HCTZ-AM of procedure  Except hold medications AM meds can be  taken pre-cath with sip of water including: ASA 81 mg Clopidogrel 75 mg  Confirmed patient has responsible adult to drive home post procedure and observe 24 hours after arriving home: yes  Currently, due to Covid-19 pandemic, only one support person will be allowed with patient. Must be the same support person for that patient's entire stay, will be screened and required to wear a mask. They will be asked to wait in the waiting room for the duration of the patient's stay.  Patients are required to wear a mask when they enter the hospital.     COVID-19 Pre-Screening Questions:  . In the past 7 to 10 days have you had a cough,  shortness of breath, headache, congestion, fever (100 or greater) body aches, chills, sore throat, or sudden loss of taste or sense of smell? no . Have you been around anyone with known Covid 19? no . Have you been around anyone who is awaiting Covid 19 test results in the past 7 to 10 days? no . Have you been around anyone who has been exposed to Covid 19, or has mentioned symptoms of Covid 19 within the past 7 to 10 days? no   I reviewed procedure/mask/visitor instructions, Covid-19 screening questions with patient, she verbalized understanding, thanked me for call.

## 2018-12-25 NOTE — H&P (Signed)
   Mid LAD total occlusion and highgh grade diagonal o bstruction  Needs LAD and Diag stent  Higher risk for ischemic complications.

## 2018-12-26 ENCOUNTER — Encounter (HOSPITAL_COMMUNITY): Payer: Self-pay | Admitting: *Deleted

## 2018-12-26 ENCOUNTER — Other Ambulatory Visit: Payer: Self-pay

## 2018-12-26 ENCOUNTER — Ambulatory Visit (HOSPITAL_COMMUNITY)
Admission: RE | Admit: 2018-12-26 | Discharge: 2018-12-27 | Disposition: A | Discharge status: Home / Self Care | Payer: Medicare Other | Source: Ambulatory Visit | Attending: Interventional Cardiology | Admitting: Interventional Cardiology

## 2018-12-26 ENCOUNTER — Encounter (HOSPITAL_COMMUNITY)
Admission: RE | Disposition: A | Discharge status: Home / Self Care | Payer: Self-pay | Source: Ambulatory Visit | Attending: Interventional Cardiology

## 2018-12-26 DIAGNOSIS — I209 Angina pectoris, unspecified: Secondary | ICD-10-CM | POA: Diagnosis present

## 2018-12-26 DIAGNOSIS — D649 Anemia, unspecified: Secondary | ICD-10-CM | POA: Insufficient documentation

## 2018-12-26 DIAGNOSIS — I25118 Atherosclerotic heart disease of native coronary artery with other forms of angina pectoris: Secondary | ICD-10-CM | POA: Diagnosis not present

## 2018-12-26 DIAGNOSIS — Z87891 Personal history of nicotine dependence: Secondary | ICD-10-CM | POA: Diagnosis not present

## 2018-12-26 DIAGNOSIS — I739 Peripheral vascular disease, unspecified: Secondary | ICD-10-CM | POA: Insufficient documentation

## 2018-12-26 DIAGNOSIS — I2582 Chronic total occlusion of coronary artery: Secondary | ICD-10-CM | POA: Diagnosis not present

## 2018-12-26 DIAGNOSIS — E782 Mixed hyperlipidemia: Secondary | ICD-10-CM | POA: Diagnosis not present

## 2018-12-26 DIAGNOSIS — Z79899 Other long term (current) drug therapy: Secondary | ICD-10-CM | POA: Diagnosis not present

## 2018-12-26 DIAGNOSIS — Z881 Allergy status to other antibiotic agents status: Secondary | ICD-10-CM | POA: Diagnosis not present

## 2018-12-26 DIAGNOSIS — I2510 Atherosclerotic heart disease of native coronary artery without angina pectoris: Secondary | ICD-10-CM | POA: Diagnosis present

## 2018-12-26 DIAGNOSIS — Z955 Presence of coronary angioplasty implant and graft: Secondary | ICD-10-CM | POA: Diagnosis not present

## 2018-12-26 DIAGNOSIS — Z7982 Long term (current) use of aspirin: Secondary | ICD-10-CM | POA: Insufficient documentation

## 2018-12-26 DIAGNOSIS — I10 Essential (primary) hypertension: Secondary | ICD-10-CM | POA: Diagnosis present

## 2018-12-26 DIAGNOSIS — R0600 Dyspnea, unspecified: Secondary | ICD-10-CM | POA: Diagnosis present

## 2018-12-26 DIAGNOSIS — Z7902 Long term (current) use of antithrombotics/antiplatelets: Secondary | ICD-10-CM | POA: Insufficient documentation

## 2018-12-26 DIAGNOSIS — I25758 Atherosclerosis of native coronary artery of transplanted heart with other forms of angina pectoris: Secondary | ICD-10-CM | POA: Insufficient documentation

## 2018-12-26 DIAGNOSIS — R0609 Other forms of dyspnea: Secondary | ICD-10-CM | POA: Diagnosis present

## 2018-12-26 HISTORY — PX: CORONARY STENT INTERVENTION: CATH118234

## 2018-12-26 LAB — POCT ACTIVATED CLOTTING TIME
Activated Clotting Time: 186 seconds
Activated Clotting Time: 219 seconds
Activated Clotting Time: 235 seconds
Activated Clotting Time: 252 seconds
Activated Clotting Time: 274 seconds
Activated Clotting Time: 191 seconds
Activated Clotting Time: 175 seconds

## 2018-12-26 LAB — CBC
HCT: 37.1 % (ref 36.0–46.0)
Hemoglobin: 12.4 g/dL (ref 12.0–15.0)
MCH: 31.2 pg (ref 26.0–34.0)
MCHC: 33.4 g/dL (ref 30.0–36.0)
MCV: 93.5 fL (ref 80.0–100.0)
Platelets: 275 10*3/uL (ref 150–400)
RBC: 3.97 MIL/uL (ref 3.87–5.11)
RDW: 15.1 % (ref 11.5–15.5)
WBC: 8 10*3/uL (ref 4.0–10.5)
nRBC: 0 % (ref 0.0–0.2)

## 2018-12-26 LAB — BASIC METABOLIC PANEL
Anion gap: 8 (ref 5–15)
BUN: 12 mg/dL (ref 8–23)
CO2: 26 mmol/L (ref 22–32)
Calcium: 9.2 mg/dL (ref 8.9–10.3)
Chloride: 107 mmol/L (ref 98–111)
Creatinine, Ser: 1.18 mg/dL — ABNORMAL HIGH (ref 0.44–1.00)
GFR calc Af Amer: 55 mL/min — ABNORMAL LOW (ref >60)
GFR calc non Af Amer: 48 mL/min — ABNORMAL LOW (ref >60)
Glucose, Bld: 114 mg/dL — ABNORMAL HIGH (ref 70–99)
Potassium: 4.2 mmol/L (ref 3.5–5.1)
Sodium: 141 mmol/L (ref 135–145)

## 2018-12-26 LAB — CREATININE, SERUM
Creatinine, Ser: 0.91 mg/dL (ref 0.44–1.00)
GFR calc Af Amer: >60 mL/min (ref >60)
GFR calc non Af Amer: >60 mL/min (ref >60)

## 2018-12-26 SURGERY — CORONARY STENT INTERVENTION
Anesthesia: LOCAL

## 2018-12-26 MED ORDER — LABETALOL HCL 5 MG/ML IV SOLN: 10.0000 mg | INTRAVENOUS | Status: AC | PRN

## 2018-12-26 MED ORDER — ASPIRIN 81 MG PO CHEW: 81.0000 mg | CHEWABLE_TABLET | Freq: Every day | ORAL | Status: DC

## 2018-12-26 MED ORDER — MIDAZOLAM HCL 2 MG/2ML IJ SOLN
INTRAMUSCULAR | Status: AC
  Filled 2018-12-26: qty 2

## 2018-12-26 MED ORDER — METOPROLOL SUCCINATE ER 25 MG PO TB24
25.0000 mg | ORAL_TABLET | Freq: Every day | ORAL | Status: DC
  Filled 2018-12-26: qty 1

## 2018-12-26 MED ORDER — PNEUMOCOCCAL VAC POLYVALENT 25 MCG/0.5ML IJ INJ: 0.5000 mL | INJECTION | INTRAMUSCULAR | Status: DC

## 2018-12-26 MED ORDER — PREDNISONE 5 MG PO TABS
5.0000 mg | ORAL_TABLET | Freq: Every day | ORAL | Status: DC
  Filled 2018-12-26: qty 1

## 2018-12-26 MED ORDER — CLOPIDOGREL BISULFATE 300 MG PO TABS
ORAL_TABLET | ORAL | Status: AC
  Filled 2018-12-26: qty 1

## 2018-12-26 MED ORDER — NITROGLYCERIN 1 MG/10 ML FOR IR/CATH LAB
INTRA_ARTERIAL | Status: DC | PRN
  Administered 2018-12-26 (×3): 100 ug via INTRACORONARY

## 2018-12-26 MED ORDER — IOHEXOL 350 MG/ML SOLN
INTRAVENOUS | Status: DC | PRN
  Administered 2018-12-26: 145 mL via INTRA_ARTERIAL

## 2018-12-26 MED ORDER — DORZOLAMIDE HCL 2 % OP SOLN
1.0000 [drp] | Freq: Two times a day (BID) | OPHTHALMIC | Status: DC
  Administered 2018-12-26: 1 [drp] via OPHTHALMIC
  Filled 2018-12-26: qty 10

## 2018-12-26 MED ORDER — MIDAZOLAM HCL 2 MG/2ML IJ SOLN
INTRAMUSCULAR | Status: DC | PRN
  Administered 2018-12-26 (×2): 0.5 mg via INTRAVENOUS
  Administered 2018-12-26 (×2): 1 mg via INTRAVENOUS

## 2018-12-26 MED ORDER — FENTANYL CITRATE (PF) 100 MCG/2ML IJ SOLN
INTRAMUSCULAR | Status: AC
  Filled 2018-12-26: qty 2

## 2018-12-26 MED ORDER — CALCIUM CARBONATE-VITAMIN D 500-200 MG-UNIT PO TABS
1.0000 | ORAL_TABLET | Freq: Every day | ORAL | Status: DC
  Filled 2018-12-26: qty 1

## 2018-12-26 MED ORDER — HYDRALAZINE HCL 20 MG/ML IJ SOLN: 10.0000 mg | INTRAMUSCULAR | Status: AC | PRN

## 2018-12-26 MED ORDER — BIVALIRUDIN TRIFLUOROACETATE 250 MG IV SOLR
INTRAVENOUS | Status: AC
  Filled 2018-12-26: qty 250

## 2018-12-26 MED ORDER — SODIUM CHLORIDE 0.9% FLUSH: 3.0000 mL | Freq: Two times a day (BID) | INTRAVENOUS | Status: DC

## 2018-12-26 MED ORDER — HEPARIN (PORCINE) IN NACL 1000-0.9 UT/500ML-% IV SOLN
INTRAVENOUS | Status: AC
  Filled 2018-12-26: qty 1000

## 2018-12-26 MED ORDER — ACETAMINOPHEN 325 MG PO TABS: 650.0000 mg | ORAL_TABLET | ORAL | Status: DC | PRN

## 2018-12-26 MED ORDER — PREDNISOLONE ACETATE 1 % OP SUSP
1.0000 [drp] | Freq: Two times a day (BID) | OPHTHALMIC | Status: DC
  Administered 2018-12-26: 1 [drp] via OPHTHALMIC
  Filled 2018-12-26: qty 5

## 2018-12-26 MED ORDER — LIDOCAINE HCL (PF) 1 % IJ SOLN
INTRAMUSCULAR | Status: DC | PRN
  Administered 2018-12-26: 10 mL

## 2018-12-26 MED ORDER — HEPARIN (PORCINE) IN NACL 1000-0.9 UT/500ML-% IV SOLN
INTRAVENOUS | Status: DC | PRN
  Administered 2018-12-26 (×2): 500 mL

## 2018-12-26 MED ORDER — SODIUM CHLORIDE 0.9% FLUSH
3.0000 mL | Freq: Two times a day (BID) | INTRAVENOUS | Status: DC
  Administered 2018-12-26: 3 mL via INTRAVENOUS

## 2018-12-26 MED ORDER — BIVALIRUDIN BOLUS VIA INFUSION - CUPID
INTRAVENOUS | Status: DC | PRN
  Administered 2018-12-26 (×2): 77.55 mg via INTRAVENOUS
  Administered 2018-12-26: 31.02 mg via INTRAVENOUS

## 2018-12-26 MED ORDER — ASPIRIN 81 MG PO CHEW: 81.0000 mg | CHEWABLE_TABLET | ORAL | Status: DC

## 2018-12-26 MED ORDER — SODIUM CHLORIDE 0.9 % WEIGHT BASED INFUSION: 1.0000 mL/kg/h | INTRAVENOUS | Status: AC

## 2018-12-26 MED ORDER — HEPARIN SODIUM (PORCINE) 5000 UNIT/ML IJ SOLN
5000.0000 [IU] | Freq: Three times a day (TID) | INTRAMUSCULAR | Status: DC
  Administered 2018-12-26: 5000 [IU] via SUBCUTANEOUS
  Filled 2018-12-26: qty 1

## 2018-12-26 MED ORDER — BRIMONIDINE TARTRATE 0.2 % OP SOLN
1.0000 [drp] | Freq: Three times a day (TID) | OPHTHALMIC | Status: DC
  Administered 2018-12-26: 1 [drp] via OPHTHALMIC
  Filled 2018-12-26: qty 5

## 2018-12-26 MED ORDER — FOLIC ACID 1 MG PO TABS
1.0000 mg | ORAL_TABLET | Freq: Every day | ORAL | Status: DC
  Filled 2018-12-26: qty 1

## 2018-12-26 MED ORDER — ASPIRIN EC 81 MG PO TBEC
81.0000 mg | DELAYED_RELEASE_TABLET | Freq: Every day | ORAL | Status: DC
  Filled 2018-12-26 (×2): qty 1

## 2018-12-26 MED ORDER — ONDANSETRON HCL 4 MG/2ML IJ SOLN: 4.0000 mg | Freq: Four times a day (QID) | INTRAMUSCULAR | Status: DC | PRN

## 2018-12-26 MED ORDER — SODIUM CHLORIDE 0.9% FLUSH: 3.0000 mL | INTRAVENOUS | Status: DC | PRN

## 2018-12-26 MED ORDER — CLOPIDOGREL BISULFATE 75 MG PO TABS: 75.0000 mg | ORAL_TABLET | ORAL | Status: DC

## 2018-12-26 MED ORDER — CLOPIDOGREL BISULFATE 300 MG PO TABS
ORAL_TABLET | ORAL | Status: DC | PRN
  Administered 2018-12-26: 300 mg via ORAL

## 2018-12-26 MED ORDER — HEPARIN SODIUM (PORCINE) 1000 UNIT/ML IJ SOLN
INTRAMUSCULAR | Status: AC
  Filled 2018-12-26: qty 1

## 2018-12-26 MED ORDER — FENTANYL CITRATE (PF) 100 MCG/2ML IJ SOLN
INTRAMUSCULAR | Status: DC | PRN
  Administered 2018-12-26: 25 ug via INTRAVENOUS
  Administered 2018-12-26: 50 ug via INTRAVENOUS
  Administered 2018-12-26: 25 ug via INTRAVENOUS

## 2018-12-26 MED ORDER — NITROGLYCERIN 1 MG/10 ML FOR IR/CATH LAB
INTRA_ARTERIAL | Status: AC
  Filled 2018-12-26: qty 10

## 2018-12-26 MED ORDER — PANTOPRAZOLE SODIUM 40 MG PO TBEC
40.0000 mg | DELAYED_RELEASE_TABLET | Freq: Every day | ORAL | Status: DC
  Filled 2018-12-26: qty 1

## 2018-12-26 MED ORDER — BENAZEPRIL HCL 20 MG PO TABS
20.0000 mg | ORAL_TABLET | Freq: Every day | ORAL | Status: DC
  Filled 2018-12-26: qty 1

## 2018-12-26 MED ORDER — SODIUM CHLORIDE 0.9 % IV SOLN
INTRAVENOUS | Status: AC | PRN
  Administered 2018-12-26: 200 mL/h via INTRAVENOUS

## 2018-12-26 MED ORDER — SODIUM CHLORIDE 0.9 % WEIGHT BASED INFUSION
1.0000 mL/kg/h | INTRAVENOUS | Status: DC
  Administered 2018-12-26: 13:00:00 500 mL via INTRAVENOUS

## 2018-12-26 MED ORDER — HEPARIN SODIUM (PORCINE) 1000 UNIT/ML IJ SOLN
INTRAMUSCULAR | Status: DC | PRN
  Administered 2018-12-26: 2000 [IU] via INTRAVENOUS
  Administered 2018-12-26: 3000 [IU] via INTRAVENOUS
  Administered 2018-12-26: 2000 [IU] via INTRAVENOUS

## 2018-12-26 MED ORDER — LIDOCAINE HCL (PF) 1 % IJ SOLN
INTRAMUSCULAR | Status: AC
  Filled 2018-12-26: qty 30

## 2018-12-26 MED ORDER — SODIUM CHLORIDE 0.9 % IV SOLN: 250.0000 mL | INTRAVENOUS | Status: DC | PRN

## 2018-12-26 MED ORDER — SODIUM CHLORIDE 0.9 % WEIGHT BASED INFUSION
3.0000 mL/kg/h | INTRAVENOUS | Status: DC
  Administered 2018-12-26: 09:00:00 3 mL/kg/h via INTRAVENOUS

## 2018-12-26 MED ORDER — ROSUVASTATIN CALCIUM 20 MG PO TABS
40.0000 mg | ORAL_TABLET | Freq: Every day | ORAL | Status: DC
  Administered 2018-12-26: 40 mg via ORAL
  Filled 2018-12-26: qty 2

## 2018-12-26 MED ORDER — INFLUENZA VAC A&B SA ADJ QUAD 0.5 ML IM PRSY
0.5000 mL | PREFILLED_SYRINGE | INTRAMUSCULAR | Status: DC
  Filled 2018-12-26: qty 0.5

## 2018-12-26 MED ORDER — CLOPIDOGREL BISULFATE 75 MG PO TABS
75.0000 mg | ORAL_TABLET | Freq: Every day | ORAL | Status: DC
  Filled 2018-12-26: qty 1

## 2018-12-26 MED ORDER — ADULT MULTIVITAMIN W/MINERALS CH
1.0000 | ORAL_TABLET | Freq: Every day | ORAL | Status: DC
  Filled 2018-12-26: qty 1

## 2018-12-26 MED ORDER — HYDROCHLOROTHIAZIDE 12.5 MG PO CAPS
12.5000 mg | ORAL_CAPSULE | Freq: Every day | ORAL | Status: DC
  Filled 2018-12-26: qty 1

## 2018-12-26 MED ORDER — SODIUM CHLORIDE 0.9 % IV SOLN
INTRAVENOUS | Status: DC | PRN
  Administered 2018-12-26: 1.75 mg/kg/h via INTRAVENOUS

## 2018-12-26 MED ORDER — MULTI-VITAMIN/MINERALS PO TABS: 1.0000 | ORAL_TABLET | Freq: Every day | ORAL | Status: DC

## 2018-12-26 MED ORDER — NITROGLYCERIN 0.4 MG SL SUBL: 0.4000 mg | SUBLINGUAL_TABLET | SUBLINGUAL | Status: DC | PRN

## 2018-12-26 MED ORDER — CALCIUM-VITAMIN D 600-400 MG-UNIT PO TABS: 1.0000 | ORAL_TABLET | Freq: Every day | ORAL | Status: DC

## 2018-12-26 SURGICAL SUPPLY — 26 items
BALLN SAPPHIRE 2.0X12 (BALLOONS) ×2
BALLN SAPPHIRE 2.5X15 (BALLOONS) ×2
BALLN SAPPHIRE ~~LOC~~ 2.25X12 (BALLOONS) ×2 IMPLANT
BALLN ~~LOC~~ EMERGE MR 2.75X20 (BALLOONS) ×2
BALLOON SAPPHIRE 2.0X12 (BALLOONS) ×1 IMPLANT
BALLOON SAPPHIRE 2.5X15 (BALLOONS) ×1 IMPLANT
BALLOON ~~LOC~~ EMERGE MR 2.75X20 (BALLOONS) ×1 IMPLANT
CATH VISTA GUIDE 6FR XBLAD3.0 (CATHETERS) ×2 IMPLANT
CATH VISTA GUIDE 6FR XBLAD3.5 (CATHETERS) ×2 IMPLANT
ELECT DEFIB PAD ADLT CADENCE (PAD) ×2 IMPLANT
GLIDESHEATH SLEND A-KIT 6F 22G (SHEATH) IMPLANT
GUIDEWIRE INQWIRE 1.5J.035X260 (WIRE) IMPLANT
INQWIRE 1.5J .035X260CM (WIRE)
KIT ENCORE 26 ADVANTAGE (KITS) ×2 IMPLANT
KIT HEART LEFT (KITS) ×2 IMPLANT
PACK CARDIAC CATHETERIZATION (CUSTOM PROCEDURE TRAY) ×2 IMPLANT
SHEATH PINNACLE 6F 10CM (SHEATH) ×2 IMPLANT
SHEATH PROBE COVER 6X72 (BAG) ×2 IMPLANT
STENT RESOLUTE ONYX 2.0X18 (Permanent Stent) ×2 IMPLANT
STENT RESOLUTE ONYX 2.5X22 (Permanent Stent) ×2 IMPLANT
STENT RESOLUTE ONYX 2.5X26 (Permanent Stent) ×2 IMPLANT
TRANSDUCER W/STOPCOCK (MISCELLANEOUS) ×2 IMPLANT
TUBING CIL FLEX 10 FLL-RA (TUBING) ×2 IMPLANT
WIRE ASAHI PROWATER 180CM (WIRE) ×4 IMPLANT
WIRE COUGAR XT STRL 190CM (WIRE) ×2 IMPLANT
WIRE EMERALD 3MM-J .035X150CM (WIRE) ×2 IMPLANT

## 2018-12-26 NOTE — Interval H&P Note (Signed)
Cath Lab Visit (complete for each Cath Lab visit)  Clinical Evaluation Leading to the Procedure:   ACS: No.  Non-ACS:    Anginal Classification: CCS III  Anti-ischemic medical therapy: Maximal Therapy (2 or more classes of medications)  Non-Invasive Test Results: No non-invasive testing performed  Prior CABG: No previous CABG      History and Physical Interval Note:  12/26/2018 11:50 AM  Pamela Lowe  has presented today for surgery, with the diagnosis of angina.  The various methods of treatment have been discussed with the patient and family. After consideration of risks, benefits and other options for treatment, the patient has consented to  Procedure(s): CORONARY STENT INTERVENTION (N/A) as a surgical intervention.  The patient's history has been reviewed, patient examined, no change in status, stable for surgery.  I have reviewed the patient's chart and labs.  Questions were answered to the patient's satisfaction.     Belva Crome III

## 2018-12-26 NOTE — Progress Notes (Addendum)
Order for sheath removal verified per post procedural orders.ACT 175.  Procedure explained to patient and Rt femoral artery access site assessed: level 0, palpable dorsalis pedis  pulses. 6French Sheath removed and manual pressure applied for 25 minutes. Pre, peri, & post procedural vitals: HR 55-60, RR 18-25, O2 Sat upper 34-38, BP 135-140/70, Pain 0. Distal pulses remained intact after sheath removal. Access site level 0 and dressed with 4X4 gauze and tegaderm. Post procedural instructions discussed and return demonstration from patient.

## 2018-12-27 ENCOUNTER — Other Ambulatory Visit: Payer: Self-pay | Admitting: Physician Assistant

## 2018-12-27 DIAGNOSIS — Z955 Presence of coronary angioplasty implant and graft: Secondary | ICD-10-CM

## 2018-12-27 DIAGNOSIS — I25758 Atherosclerosis of native coronary artery of transplanted heart with other forms of angina pectoris: Secondary | ICD-10-CM | POA: Diagnosis not present

## 2018-12-27 DIAGNOSIS — I25118 Atherosclerotic heart disease of native coronary artery with other forms of angina pectoris: Secondary | ICD-10-CM

## 2018-12-27 DIAGNOSIS — Z881 Allergy status to other antibiotic agents status: Secondary | ICD-10-CM | POA: Diagnosis not present

## 2018-12-27 DIAGNOSIS — Z7902 Long term (current) use of antithrombotics/antiplatelets: Secondary | ICD-10-CM | POA: Diagnosis not present

## 2018-12-27 DIAGNOSIS — R0600 Dyspnea, unspecified: Secondary | ICD-10-CM | POA: Diagnosis not present

## 2018-12-27 DIAGNOSIS — Z87891 Personal history of nicotine dependence: Secondary | ICD-10-CM | POA: Diagnosis not present

## 2018-12-27 DIAGNOSIS — I2582 Chronic total occlusion of coronary artery: Secondary | ICD-10-CM | POA: Diagnosis not present

## 2018-12-27 DIAGNOSIS — I209 Angina pectoris, unspecified: Secondary | ICD-10-CM | POA: Diagnosis not present

## 2018-12-27 DIAGNOSIS — Z79899 Other long term (current) drug therapy: Secondary | ICD-10-CM | POA: Diagnosis not present

## 2018-12-27 DIAGNOSIS — E782 Mixed hyperlipidemia: Secondary | ICD-10-CM

## 2018-12-27 DIAGNOSIS — I10 Essential (primary) hypertension: Secondary | ICD-10-CM | POA: Diagnosis not present

## 2018-12-27 DIAGNOSIS — Z7982 Long term (current) use of aspirin: Secondary | ICD-10-CM | POA: Diagnosis not present

## 2018-12-27 DIAGNOSIS — D649 Anemia, unspecified: Secondary | ICD-10-CM | POA: Diagnosis not present

## 2018-12-27 DIAGNOSIS — I739 Peripheral vascular disease, unspecified: Secondary | ICD-10-CM | POA: Diagnosis not present

## 2018-12-27 LAB — CBC
HCT: 33.4 % — ABNORMAL LOW (ref 36.0–46.0)
Hemoglobin: 11 g/dL — ABNORMAL LOW (ref 12.0–15.0)
MCH: 31 pg (ref 26.0–34.0)
MCHC: 32.9 g/dL (ref 30.0–36.0)
MCV: 94.1 fL (ref 80.0–100.0)
Platelets: 261 10*3/uL (ref 150–400)
RBC: 3.55 MIL/uL — ABNORMAL LOW (ref 3.87–5.11)
RDW: 15.2 % (ref 11.5–15.5)
WBC: 6.9 10*3/uL (ref 4.0–10.5)
nRBC: 0 % (ref 0.0–0.2)

## 2018-12-27 LAB — BASIC METABOLIC PANEL
Anion gap: 9 (ref 5–15)
BUN: 9 mg/dL (ref 8–23)
CO2: 24 mmol/L (ref 22–32)
Calcium: 9 mg/dL (ref 8.9–10.3)
Chloride: 108 mmol/L (ref 98–111)
Creatinine, Ser: 0.99 mg/dL (ref 0.44–1.00)
GFR calc Af Amer: >60 mL/min (ref >60)
GFR calc non Af Amer: 59 mL/min — ABNORMAL LOW (ref >60)
Glucose, Bld: 98 mg/dL (ref 70–99)
Potassium: 3.8 mmol/L (ref 3.5–5.1)
Sodium: 141 mmol/L (ref 135–145)

## 2018-12-27 MED ORDER — ANGIOPLASTY BOOK
Freq: Once | Status: DC
  Filled 2018-12-27: qty 1

## 2018-12-27 NOTE — Discharge Summary (Signed)
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 [facility]) Active Problems:   DOE (dyspnea on exertion)   Essential hypertension   Mixed hyperlipidemia   Angina pectoris ([place])   CAD (coronary artery disease)   S/P coronary artery stent placement   PAD (peripheral artery disease) ([facility])   Allergies Allergies  Allergen Reactions   Bupropion Rash    (Wellbutrin)   Sulfamethoxazole-Trimethoprim Other (See Comments) and Rash    Chills/achy Flu like symptoms    Wellbutrin [Bupropion Hcl] Rash    rash    Diagnostic Studies/Procedures    Cardiac catheterization 12/17/2018:   Obstructive coronary artery disease with complex mid LAD diagonal obstruction. There is functional total occlusion of the LAD beyond the second septal perforator and second diagonal. The mid and apical LAD is supplied by collaterals from right to left. The second diagonal contains segmental 90% stenosis. The LAD proximal to the total occlusion contains diffuse disease extending proximal to the origin of the second diagonal and second septal perforator. A percutaneous approach would cover the ostia of these 2 large branches.  Widely patent circumflex with 3 large obtuse marginals.  Diffusely diseased RCA with 40 to 60% stenosis throughout the mid segment. PDA is relatively small but contains greater than 90% ostial narrowing. The very distal end of the right coronary beyond the first left ventricular branch appears to be totally occluded.  Normal left ventricular function with LVEF 60%. LVEDP normal.  RECOMMENDATIONS:   Treatment options include  percutaneous intervention with therapy on the mid diagonal only versus extensive stenting of the LAD stent total occlusion but stents would JL a large diagonal and septal perforator. Considering the disease in the right coronary uppercase could also be made to consider coronary bypass grafting. Will discuss with colleagues.   Cardiac catheterization and PCI 12/26/2018:   Successful stent implantation in the mid segment of the second diagonal reducing 95% stenosis to 0% with TIMI grade III flow using a 2.0 x 18 Onyx postdilated the 2.25 mm in diameter.   Successful overlapping stent implantation in the mid to distal LAD using a 2.5 x 26 stent proximally overlapping a 22 x 2.5 mm stent more distally all postdilated to 2.75 mm. Total occlusion with TIMI grade 0 flow was reduced to 0% with TIMI grade III flow.  RECOMMENDATIONS:   Continue aspirin and clopidogrel indefinitely  Monitor symptoms for continued angina and if present perhaps the more proximal eccentric disease that involves the second diagonal may need therapy with PCI.  Aggressive preventive therapy aiming to get LDL less than 55.  Discharge in a.m. if no femoral artery complications or other issues overnight.  _____________  Diagnostic Dominance: Right  Intervention      Echo 05/27/18:  1. The left ventricle has normal systolic function with an ejection fraction of 60-65%. The cavity size was normal. There is mildly increased left ventricular wall thickness. Left ventricular diastolic Doppler parameters are consistent with impaired  relaxation.  2. The right ventricle has normal systolic function. The cavity was normal. There is no increase in right ventricular wall thickness.  3. There is mild mitral annular calcification present.  4. Mild thickening of  the aortic valve Mild calcification of the aortic valve.  5. There is mild dilatation of the ascending aorta measuring 39 mm.   History of Present Illness       Pamela Lowe is a 68 y.o. female with a history of CAD with significant occlusive LAD disease per coronary CTA/FFR 08/2018, hyperlipidemia, hypertension and PAD.  Patient was last seen by Dr. Tamala Julian on 11/03/2018 in follow-up after coronary CT performed with reassessment of symptoms.  She was doing well from a cardiac perspective.  Her shortness of breath had completely resolved and was not having anginal symptoms.  CT with FFR performed 08/2018 reviewed which demonstrated severe obstruction in the mid LAD and first diagonal with her right coronary and left circumflex with no evidence of ischemia.  Her exertional dyspnea was thought to be related to her significant CAD.  No cardiac catheterization was ever performed given that she was relatively asymptomatic after starting anti-ischemic and preventative therapies.  She was walking frequently without limitations and had not needing nitroglycerin.  Continued medical therapy was preferred at that time per patient.  Was recently seen by her PCP and diagnosed with costochondritis 11/14/2018 and started on NSAID therapy which has not resolved her symptoms.  She presented to clinic on 12/10/18. She stated that the pain she was experiencing was right-sided upper chest discomfort that was nonexertional and worse with deep inspiration and leaning over.  She states the area is tender to palpation and will radiate at times to her underarm and her mid back area.  She has been experiencing some mild dyspnea on exertion however not at rest.  She has had no diaphoresis, nausea, vomiting or dizziness.  She reports having a family history of blood clot disorders and we talked about proceeding with testing to rule out PE.  We discussed options given her known coronary artery disease per coronary CT scan previously performed and discussed with Dr. Tamala Julian.  We spoke about the possibility of cardiac cath as this may be her anginal equivalent.  She reported taking 1 sublingual  nitroglycerin yesterday evening while laying in bed with some resolution.  She stated the symptoms tend to be worse at night but she cannot discern if this is because she is more focused on the symptoms at that time.  Stated that she has tried to continue exercising on her stationary bike with no increase in her symptoms however she reports fatigue secondary to being on methotrexate per patient report. Case discussed with Dr. Tamala Julian and cardiac catheterization was planned.    Hospital Course     Consultants:   CAD s/p DES to D2, overlapping DES to mid-distal LAD Left heart catheterization was performed on 12/17/18 and revealed complex mid LAD diagonal obstruction with functional total occlusion of LAD beyond the second septal perforator and second diagonal. Right to left collaterals supply mid and apical LAD. The second diagonal contained segmental 90% stenosis. The LAD proximal to the total occlusion contains diffuse disease extending proximal to the origin of the second diagonal and second septal perforator. A percutaneous approach would cover the ostia of these 2 large branches. Widely patent Cx with 3 large OMs. Diffusely diseased RCA with 40-60% stenosis. Options of PCI vs CABG were discussed. Ultimately, patient underwent second heart cath 12/26/18 with successful DES to mid segment of second diagonal and overlapping DES to mid-distal LAD.  Pt tolerated the procedure well. Femoral site C/D/I without hematoma. Plan is for DAPT with ASA and plavix indefinitely. Continue BB.  Hyperlipemia with LDL goal < 55 10/20/2018: Cholesterol, Total 190; HDL 78; LDL Calculated 91; Triglycerides 103 Crestor 40 mg. Repeat lipids in 6 weeks. May need lipid clinic for aggressive treatment given her disease.   Hypertension Continue toprol, benazepril, and HCTZ.[name]   PAD ASA and statin as above.   Anemia Hb 11 (12.4). Repeat CBC next week. No evidence of hematoma.    PT seen and examined by Dr. Bronson Ing  and deemed stable for discharge. Follow up has been made.    Did the patient have an acute coronary syndrome (MI, NSTEMI, STEMI, etc) this admission?:  No                               Did the patient have a percutaneous coronary intervention (stent / angioplasty)?:  Yes.     Cath/PCI Registry Performance & Quality Measures: 1. Aspirin prescribed? - Yes 2. ADP Receptor Inhibitor (Plavix/Clopidogrel, Brilinta/Ticagrelor or Effient/Prasugrel) prescribed (includes medically managed patients)? - Yes 3. High Intensity Statin (Lipitor 40-80mg  or Crestor 20-40mg ) prescribed? - Yes 4. For EF <40%, was ACEI/ARB prescribed? - Not Applicable (EF >/= [id number]) 5. For EF <40%, Aldosterone Antagonist (Spironolactone or Eplerenone) prescribed? - Not Applicable (EF >/= [id number]) 6. Cardiac Rehab Phase II ordered (Included Medically managed Patients)? - Yes   _____________  Discharge Vitals Blood pressure 117/73, pulse (!) 56, temperature 98.6 F (37 C), temperature source Oral, resp. rate 16, height 5\' 7"  (1.702 m), weight 105.8 kg, last menstrual period 03/19/1998, SpO2 97 %.  Filed Weights   12/26/18 0843 12/26/18 1722 12/27/18 0556  Weight: 103.4 kg 108 kg 105.8 kg    Labs & Radiologic Studies    CBC Recent Labs    12/26/18 1744 12/27/18 0402  WBC 8.0 6.9  HGB 12.4 11.0*  HCT 37.1 33.4*  MCV 93.5 94.1  PLT 275 0000000   Basic Metabolic Panel Recent Labs    12/26/18 0902 12/26/18 1744 12/27/18 0402  NA 141  --  141  K 4.2  --  3.8  CL 107  --  108  CO2 26  --  24  GLUCOSE 114*  --  98  BUN 12  --  9  CREATININE 1.18* 0.91 0.99  CALCIUM 9.2  --  9.0   Liver Function Tests No results for input(s): AST, ALT, ALKPHOS, BILITOT, PROT, ALBUMIN in the last 72 hours. No results for input(s): LIPASE, AMYLASE in the last 72 hours. High Sensitivity Troponin:   No results for input(s): TROPONINIHS in the last 720 hours.  BNP Invalid input(s): POCBNP D-Dimer No results for input(s): DDIMER in the  last 72 hours. Hemoglobin A1C No results for input(s): HGBA1C in the last 72 hours. Fasting Lipid Panel No results for input(s): CHOL, HDL, LDLCALC, TRIG, CHOLHDL, LDLDIRECT in the last 72 hours. Thyroid Function Tests No results for input(s): TSH, T4TOTAL, T3FREE, THYROIDAB in the last 72 hours.  Invalid input(s): FREET3 _____________  Ct Angio Chest Pe W Or Wo Contrast  Result Date: 12/11/2018 CLINICAL DATA:  Shortness of breath and chest pain EXAM: CT ANGIOGRAPHY CHEST WITH CONTRAST TECHNIQUE: Multidetector CT imaging of the chest was performed using the standard protocol during bolus administration of intravenous contrast. Multiplanar CT image reconstructions and MIPs were obtained to evaluate the vascular anatomy. CONTRAST:  43mL OMNIPAQUE IOHEXOL 350 MG/ML SOLN COMPARISON:  None. FINDINGS: Cardiovascular: There is no demonstrable pulmonary embolus. There is no thoracic aortic aneurysm or dissection. The  visualized great vessels appear unremarkable. There are foci of aortic atherosclerosis. There are scattered foci of coronary artery calcification. There is no pericardial effusion or pericardial thickening. Mediastinum/Nodes: Thyroid appears unremarkable. There is no appreciable thoracic adenopathy. There is a small hiatal hernia. Lungs/Pleura: Lungs are clear.  No pleural effusions are evident. Upper Abdomen: There is incomplete visualization of a cyst in the medial upper right kidney measuring 2.1 x 2.0 cm. Visualized upper abdominal structures otherwise appear unremarkable. Musculoskeletal: No blastic or lytic bone lesions. No chest wall lesions evident. Review of the MIP images confirms the above findings. IMPRESSION: 1. No demonstrable pulmonary embolus. No thoracic aortic aneurysm or dissection. There are foci of aortic atherosclerosis and foci of coronary artery calcification. 2.  Lungs clear. 3.  No evident adenopathy. 4.  Small hiatal hernia. Aortic Atherosclerosis (ICD10-I70.0).  Electronically Signed   By: Lowella Grip III M.D.   On: 12/11/2018 14:54   US Pelvis Transvaginal Non-ob (tv Only)  Result Date: 11/27/2018 SEE PROGRESS NOTE  Disposition   Pt is being discharged home today in good condition.  Follow-up Plans & Appointments    Follow-up Information    Belva Crome, MD Follow up in 1 week(s).   Specialty: Cardiology Why: office will call with appt Contact information: [id number] [place] [address] [phone]          Discharge Instructions    Diet - low sodium heart healthy   Complete by: As directed    Discharge instructions   Complete by: As directed    No driving for 1 week. No lifting over 5 lbs for 1 week. No sexual activity for 1 week.  Keep procedure site clean & dry. If you notice increased pain, swelling, bleeding or pus, call/return!  You may shower, but no soaking baths/hot tubs/pools for 1 week.   Increase activity slowly   Complete by: As directed       Discharge Medications   Allergies as of 12/27/2018      Reactions   Bupropion Rash   (Wellbutrin)   Sulfamethoxazole-trimethoprim Other (See Comments), Rash   Chills/achy Flu like symptoms    Wellbutrin [bupropion Hcl] Rash   rash      Medication List    TAKE these medications   Aspirin 81 81 MG EC tablet Generic drug: aspirin Take 81 mg by mouth daily.   benazepril 20 MG tablet Commonly known as: LOTENSIN Take 1 tablet (20 mg total) by mouth daily.   brimonidine 0.2 % ophthalmic solution Commonly known as: ALPHAGAN Place 1 drop into both eyes 3 (three) times daily.   Calcium-Vitamin D 600-400 MG-UNIT Tabs Take 1 tablet by mouth daily.   cetirizine 10 MG tablet Commonly known as: ZYRTEC Take 10 mg by mouth daily.   clopidogrel 75 MG tablet Commonly known as: Plavix Take 1 tablet (75 mg total) by mouth daily.   dorzolamide 2 % ophthalmic solution Commonly known as: TRUSOPT Place 1 drop into both eyes 2 (two) times  daily.   Fish Oil 1000 MG Caps Take by mouth daily.   folic acid 1 MG tablet Commonly known as: FOLVITE Take 1 mg by mouth daily.   hydrochlorothiazide 12.5 MG capsule Commonly known as: MICROZIDE Take 12.5 mg by mouth daily.   methotrexate 2.5 MG tablet Commonly known as: RHEUMATREX Take 20 mg by mouth once a week. Mondays   metoprolol succinate 25 MG 24 hr tablet Commonly known as: TOPROL-XL Take 1 tablet (25 mg  total) by mouth daily.   multivitamin with minerals tablet Take 1 tablet by mouth daily.   nitroGLYCERIN 0.4 MG SL tablet Commonly known as: NITROSTAT Place 1 tablet (0.4 mg total) under the tongue every 5 (five) minutes as needed for chest pain.   pantoprazole 40 MG tablet Commonly known as: PROTONIX Take 1 tablet (40 mg total) by mouth daily.   prednisoLONE acetate 1 % ophthalmic suspension Commonly known as: PRED FORTE Place 1 drop into both eyes 2 (two) times daily.   predniSONE 5 MG tablet Commonly known as: DELTASONE Take 5 mg by mouth daily.   rosuvastatin 40 MG tablet Commonly known as: CRESTOR Take 1 tablet (40 mg total) by mouth daily.   SIMPONI ARIA IV Inject 12.5 mLs into the vein every 8 (eight) weeks.          Outstanding Labs/Studies   CBC next week Repeat lipids in 6 weeks   Duration of Discharge Encounter   Greater than 30 minutes including physician time.  Signed, [place], PA 12/27/2018, 9:07 AM

## 2018-12-27 NOTE — Progress Notes (Addendum)
Progress Note  Patient Name: Pamela Lowe Date of Encounter: 12/27/2018  Primary Cardiologist: Sinclair Grooms, MD   Subjective   Doing well and denies chest pain or shortness of breath.  Inpatient Medications    Scheduled Meds: . aspirin EC  81 mg Oral Daily  . benazepril  20 mg Oral Daily  . brimonidine  1 drop Both Eyes TID  . calcium-vitamin D  1 tablet Oral Q breakfast  . clopidogrel  75 mg Oral Daily  . dorzolamide  1 drop Both Eyes BID  . folic acid  1 mg Oral Daily  . heparin  5,000 Units Subcutaneous Q8H  . hydrochlorothiazide  12.5 mg Oral Daily  . influenza vaccine adjuvanted  0.5 mL Intramuscular Tomorrow-1000  . metoprolol succinate  25 mg Oral Daily  . multivitamin with minerals  1 tablet Oral Daily  . pantoprazole  40 mg Oral Daily  . pneumococcal 23 valent vaccine  0.5 mL Intramuscular Tomorrow-1000  . prednisoLONE acetate  1 drop Both Eyes BID  . predniSONE  5 mg Oral Daily  . rosuvastatin  40 mg Oral q1800  . sodium chloride flush  3 mL Intravenous Q12H  . sodium chloride flush  3 mL Intravenous Q12H   Continuous Infusions: . sodium chloride     PRN Meds: sodium chloride, acetaminophen, nitroGLYCERIN, ondansetron (ZOFRAN) IV, sodium chloride flush   Vital Signs    Vitals:   12/26/18 1722 12/26/18 2042 12/27/18 0552 12/27/18 0556  BP: 134/84 108/65 117/73   Pulse: (!) 55  (!) 56   Resp: 18 16 16    Temp: 98.4 F (36.9 C) 98.4 F (36.9 C) 98.6 F (37 C)   TempSrc: Oral Oral Oral   SpO2: 100% 96% 97%   Weight: 108 kg   105.8 kg  Height: 5\' 7"  (1.702 m)       Intake/Output Summary (Last 24 hours) at 12/27/2018 0810 Last data filed at 12/27/2018 0550 Gross per 24 hour  Intake 245.23 ml  Output 1725 ml  Net -1479.77 ml   Filed Weights   12/26/18 0843 12/26/18 1722 12/27/18 0556  Weight: 103.4 kg 108 kg 105.8 kg    Telemetry    Sinus rhythm, sinus arrhythmia, PACs- Personally Reviewed  ECG    Sinus rhythm with sinus  arrhythmia- Personally Reviewed  Physical Exam   GEN: No acute distress.   Neck: No JVD Cardiac: RRR, no murmurs, rubs, or gallops.  Respiratory: Clear to auscultation bilaterally. GI: Soft, nontender, non-distended  MS: No edema; No deformity. Neuro:  Nonfocal  Psych: Normal affect   Labs    Chemistry Recent Labs  Lab 12/26/18 0902 12/26/18 1744 12/27/18 0402  NA 141  --  141  K 4.2  --  3.8  CL 107  --  108  CO2 26  --  24  GLUCOSE 114*  --  98  BUN 12  --  9  CREATININE 1.18* 0.91 0.99  CALCIUM 9.2  --  9.0  GFRNONAA 48* >60 59*  GFRAA 55* >60 >60  ANIONGAP 8  --  9     Hematology Recent Labs  Lab 12/26/18 1744 12/27/18 0402  WBC 8.0 6.9  RBC 3.97 3.55*  HGB 12.4 11.0*  HCT 37.1 33.4*  MCV 93.5 94.1  MCH 31.2 31.0  MCHC 33.4 32.9  RDW 15.1 15.2  PLT 275 261    Cardiac EnzymesNo results for input(s): TROPONINI in the last 168 hours. No results for input(s): TROPIPOC  in the last 168 hours.   BNPNo results for input(s): BNP, PROBNP in the last 168 hours.   DDimer No results for input(s): DDIMER in the last 168 hours.   Radiology    No results found.  Cardiac Studies   Cardiac catheterization 12/17/2018:   Obstructive coronary artery disease with complex mid LAD diagonal obstruction.  There is functional total occlusion of the LAD beyond the second septal perforator and second diagonal.  The mid and apical LAD is supplied by collaterals from right to left.  The second diagonal contains segmental 90% stenosis.  The LAD proximal to the total occlusion contains diffuse disease extending proximal to the origin of the second diagonal and second septal perforator.  A percutaneous approach would cover the ostia of these 2 large branches.  Widely patent circumflex with 3 large obtuse marginals.  Diffusely diseased RCA with 40 to 60% stenosis throughout the mid segment.  PDA is relatively small but contains greater than 90% ostial narrowing.  The very distal  end of the right coronary beyond the first left ventricular branch appears to be totally occluded.  Normal left ventricular function with LVEF 60%.  LVEDP normal.  RECOMMENDATIONS:   Treatment options include percutaneous intervention with therapy on the mid diagonal only versus extensive stenting of the LAD stent total occlusion but stents would JL a large diagonal and septal perforator.  Considering the disease in the right coronary uppercase could also be made to consider coronary bypass grafting.  Will discuss with colleagues.   Cardiac catheterization and PCI 12/26/2018:   Successful stent implantation in the mid segment of the second diagonal reducing 95% stenosis to 0% with TIMI grade III flow using a 2.0 x 18 Onyx postdilated the 2.25 mm in diameter.    Successful overlapping stent implantation in the mid to distal LAD using a 2.5 x 26 stent proximally overlapping a 22 x 2.5 mm stent more distally all postdilated to 2.75 mm.  Total occlusion with TIMI grade 0 flow was reduced to 0% with TIMI grade III flow.  RECOMMENDATIONS:   Continue aspirin and clopidogrel indefinitely  Monitor symptoms for continued angina and if present perhaps the more proximal eccentric disease that involves the second diagonal may need therapy with PCI.  Aggressive preventive therapy aiming to get LDL less than 55.  Discharge in a.m. if no femoral artery complications or other issues overnight.  Patient Profile     68 y.o. female with a history of CAD with significant occlusive LAD disease per coronary CTA/FFR 08/2018, hyperlipidemia, hypertension and PAD who presented for coronary angiography and PCI.  Assessment & Plan    1.  Coronary artery disease: Status post multivessel PCI as detailed above.  The plan is to continue aspirin and clopidogrel indefinitely.  Continue Toprol-XL 25 mg daily and rosuvastatin 40 mg.  2.  Hypertension: Blood pressures controlled.  No changes to therapy.  Continue  Toprol-XL and benazepril.  3.  Hyperlipidemia: Continue rosuvastatin 40 mg.  LDL 91 on 10/20/2018.  Continue Repatha.  Followed by lipid clinic.  4.  Peripheral arterial disease: Continue aspirin and statin.  5.  Anemia: Hemoglobin down to 11 from 12.4 yesterday.  Will need repeat CBC next week.  No evidence of hematoma.  Hemodynamically stable.  Disposition: We will discharge to home today after cardiac rehabilitation assessment.    For questions or updates, please contact [place] Please consult [email or web address] for contact info under Cardiology/STEMI.      Signed, Kate Sable, MD  12/27/2018, 8:10 AM

## 2018-12-27 NOTE — Progress Notes (Addendum)
CARDIAC REHAB PHASE I   PRE:  Rate/Rhythm: 69 SR  BP:  Sitting: 129/84        SaO2: 497% RA  MODE:  Ambulation: 470 ft   POST:  Rate/Rhythm: 94 Sr  BP:  Sitting: 137/76      SaO2: 98% RA  Pt ambulated well with one assist 470 ft. Pt denied SOB or CP. Pt educated on exercise guidelines, NTG use, stent card, Plavix use, nutrition, restrictions, and CRP II. Referred to CRP II [place]. Pt to bed with call bell and phone within reach. Pt stated she was unsure of some medications. Encouraged Pt to talk with nurse before discharge about medications she was unsure about.   [id number]  [place], ACSM CEP 12/27/2018  10:12 AM

## 2018-12-27 NOTE — Plan of Care (Signed)
  Problem: Clinical Measurements: Goal: Respiratory complications will improve Outcome: Progressing Note: Stable on room air.  No s/s of respiratory complications noted. Goal: Cardiovascular complication will be avoided Outcome: Progressing Note: NSR on telemetry, VSS.  No s/s of cardiovascular complications noted.

## 2018-12-29 ENCOUNTER — Telehealth (HOSPITAL_COMMUNITY): Payer: Self-pay

## 2018-12-29 ENCOUNTER — Encounter (HOSPITAL_COMMUNITY): Payer: Self-pay | Admitting: Interventional Cardiology

## 2018-12-29 NOTE — Telephone Encounter (Signed)
Called patient to see if she is interested in the Cardiac Rehab Program. Patient expressed interest. Explained scheduling process and went over insurance, patient verbalized understanding. Adv pt she will need to schedule and complete her f/u appt before we can schedule her. Pt stated she understood.

## 2018-12-29 NOTE — Telephone Encounter (Signed)
Pt insurance is active and benefits verified through Medicare A/B. Co-pay $0.00, DED $198.00/$198.00 met, out of pocket $0.00/$0.00 met, co-insurance 20%. No pre-authorization required. Passport, 12/29/2018 @ 12:26PM, YYT#[id number]   2ndary insurance is active and benefits verified through [employer]. Co-pay $0.00, DED $0.00/$0.00 met, out of pocket $0.00/$0.00 met, co-insurance 0%. No pre-authorization required. Passport, 12/29/2018 @ 12:29PM, YFV#[id number]   Will contact patient to see if she is interested in the Cardiac Rehab Program. If interested, patient will need to complete follow up appt. Once completed, patient will be contacted for scheduling upon review by the RN Navigator.

## 2018-12-30 ENCOUNTER — Telehealth: Payer: Self-pay

## 2018-12-30 DIAGNOSIS — H209 Unspecified iridocyclitis: Secondary | ICD-10-CM | POA: Diagnosis not present

## 2018-12-30 DIAGNOSIS — H401113 Primary open-angle glaucoma, right eye, severe stage: Secondary | ICD-10-CM | POA: Diagnosis not present

## 2018-12-30 DIAGNOSIS — H401122 Primary open-angle glaucoma, left eye, moderate stage: Secondary | ICD-10-CM | POA: Diagnosis not present

## 2018-12-30 NOTE — Telephone Encounter (Signed)
-----   Message from Ledora Bottcher, [place] sent at 12/27/2018  8:53 AM EDT ----- Pt needs TOC appt s/p DES. Please schedule with Sharee Pimple if she has availability. She is a Dr. Tamala Julian patient.  Thanks Angie

## 2018-12-30 NOTE — Telephone Encounter (Addendum)
TCM Call-1st attempt I left a message on the pts VM asking her to call me back. I did leave the office phone number and my name as well as a reminder that she does have a hospital f/u scheduled with Kathyrn Drown, NP on 01/16/2019 @ 2:30 @ Dr Darliss Ridgel office.

## 2018-12-31 ENCOUNTER — Encounter (HOSPITAL_COMMUNITY): Payer: Self-pay | Admitting: Interventional Cardiology

## 2018-12-31 NOTE — Telephone Encounter (Signed)
**Note De-Identified  Obfuscation** Patient contacted regarding discharge from [facility] on 12/27/2018.  Patient understands to follow up with provider Kathyrn Drown, NP on 01/16/2019 at 2:30 at [facility] in [place], [place] [address]. Patient understands discharge instructions? Yes Patient understands medications and regiment? Yes Patient understands to bring all medications to this visit? Yes

## 2019-01-07 ENCOUNTER — Ambulatory Visit (INDEPENDENT_AMBULATORY_CARE_PROVIDER_SITE_OTHER): Payer: Medicare Other | Admitting: Acute Care

## 2019-01-07 ENCOUNTER — Other Ambulatory Visit: Payer: Self-pay

## 2019-01-07 ENCOUNTER — Ambulatory Visit
Admission: RE | Admit: 2019-01-07 | Discharge: 2019-01-07 | Disposition: A | Discharge status: Home / Self Care | Payer: Medicare Other | Source: Ambulatory Visit | Attending: Acute Care | Admitting: Acute Care

## 2019-01-07 ENCOUNTER — Other Ambulatory Visit: Payer: Self-pay | Admitting: *Deleted

## 2019-01-07 ENCOUNTER — Encounter: Payer: Self-pay | Admitting: Acute Care

## 2019-01-07 VITALS — BP 128/60 | HR 73 | Temp 97.8°F | Ht 67.0 in | Wt 235.0 lb

## 2019-01-07 DIAGNOSIS — Z87891 Personal history of nicotine dependence: Secondary | ICD-10-CM

## 2019-01-07 DIAGNOSIS — Z122 Encounter for screening for malignant neoplasm of respiratory organs: Secondary | ICD-10-CM

## 2019-01-07 NOTE — Patient Instructions (Signed)
Thank you for participating in the [facility]. It was our pleasure to meet you today. We will call you with the results of your scan within the next few days. Your scan will be assigned a Lung RADS category score by the physicians reading the scans.  This Lung RADS score determines follow up scanning.  See below for description of categories, and follow up screening recommendations. We will be in touch to schedule your follow up screening annually or based on recommendations of our providers. We will fax a copy of your scan results to your Primary Care Physician, or the physician who referred you to the program, to ensure they have the results. Please call the office if you have any questions or concerns regarding your scanning experience or results.  Our office number is [phone]. Please speak with Denise Phelps, RN. She is our Lung Cancer Screening RN. If she is unavailable when you call, please have the office staff send her a message. She will return your call at her earliest convenience. Remember, if your scan is normal, we will scan you annually as long as you continue to meet the criteria for the program. (Age 55-77, Current smoker or smoker who has quit within the last 15 years). If you are a smoker, remember, quitting is the single most powerful action that you can take to decrease your risk of lung cancer and other pulmonary, breathing related problems. We know quitting is hard, and we are here to help.  Please let us know if there is anything we can do to help you meet your goal of quitting. If you are a former smoker, congratulations. We are proud of you! Remain smoke free! Remember you can refer friends or family members through the number above.  We will screen them to make sure they meet criteria for the program. Thank you for helping us take better care of you by participating in Lung Screening.  Lung RADS Categories:  Lung RADS 1: no nodules  or definitely non-concerning nodules.  Recommendation is for a repeat annual scan in 12 months.  Lung RADS 2:  nodules that are non-concerning in appearance and behavior with a very low likelihood of becoming an active cancer. Recommendation is for a repeat annual scan in 12 months.  Lung RADS 3: nodules that are probably non-concerning , includes nodules with a low likelihood of becoming an active cancer.  Recommendation is for a 6-month repeat screening scan. Often noted after an upper respiratory illness. We will be in touch to make sure you have no questions, and to schedule your 6-month scan.  Lung RADS 4 A: nodules with concerning findings, recommendation is most often for a follow up scan in 3 months or additional testing based on our provider's assessment of the scan. We will be in touch to make sure you have no questions and to schedule the recommended 3 month follow up scan.  Lung RADS 4 B:  indicates findings that are concerning. We will be in touch with you to schedule additional diagnostic testing based on our provider's  assessment of the scan.   

## 2019-01-07 NOTE — Progress Notes (Signed)
Shared Decision Making Visit Lung Cancer Screening Program [phone])   Eligibility:  Age 68 y.o.  Pack Years Smoking History Calculation 8 pack year smoking history (# packs/per year x # years smoked)  Recent History of coughing up blood  no  Unexplained weight loss? no ( >Than 15 pounds within the last 6 months )  Prior History Lung / other cancer no (Diagnosis within the last 5 years already requiring surveillance chest CT Scans).  Smoking Status Former Smoker  Former Smokers: Years since quit: < 1 year  Quit Date: 08/01/2017  Visit Components:  Discussion included one or more decision making aids. yes  Discussion included risk/benefits of screening. yes  Discussion included potential follow up diagnostic testing for abnormal scans. yes  Discussion included meaning and risk of over diagnosis. yes  Discussion included meaning and risk of False Positives. yes  Discussion included meaning of total radiation exposure. yes  Counseling Included:  Importance of adherence to annual lung cancer LDCT screening. yes  Impact of comorbidities on ability to participate in the program. yes  Ability and willingness to under diagnostic treatment. yes  Smoking Cessation Counseling:  Current Smokers:   Discussed importance of smoking cessation.NA > Former smoker  Information about tobacco cessation classes and interventions provided to patient. yes  Patient provided with "ticket" for LDCT Scan. yes  Symptomatic Patient. No  Counseling  Diagnosis Code: Tobacco Use Z72.0  Asymptomatic Patient yes  Counseling (Intermediate counseling: > three minutes counseling) [id number]  Former Smokers:   Discussed the importance of maintaining cigarette abstinence. yes  Diagnosis Code: Personal History of Nicotine Dependence. [id number]  Information about tobacco cessation classes and interventions provided to patient. Yes  Patient provided with "ticket" for LDCT Scan. yes  Written  Order for Lung Cancer Screening with LDCT placed in Epic. Yes (CT Chest Lung Cancer Screening Low Dose W/O CM) [id number] Z12.2-Screening of respiratory organs Z87.891-Personal history of nicotine dependence  BP 128/60 (BP Location: Right Arm)   Pulse 73   Temp 97.8 F (36.6 C) (Oral)   Ht 5\' 7"  (1.702 m)   Wt 235 lb (106.6 kg)   LMP 03/19/1998 Comment: h/o supracervical hysterectomy  SpO2 100%   BMI 36.81 kg/m    I spent 25 minutes of face to face time with Pamela Lowe discussing the risks and benefits of lung cancer screening. We viewed a power point together that explained in detail the above noted topics. We took the time to pause the power point at intervals to allow for questions to be asked and answered to ensure understanding. We discussed that she had taken the single most powerful action possible to decrease her risk of developing lung cancer when she quit smoking. I counseled her to remain smoke free, and to contact me if she ever had the desire to smoke again so that I can provide resources and tools to help support the effort to remain smoke free. We discussed the time and location of the scan, and that either  Doroteo Glassman RN or I will call with the results within  24-48 hours of receiving them. She has my card and contact information in the event she needs to speak with me, in addition to a copy of the power point we reviewed as a resource. She verbalized understanding of all of the above and had no further questions upon leaving the office.     I explained to the patient that there has been a high incidence of coronary artery disease noted  on these exams. I explained that this is a non-gated exam therefore degree or severity cannot be determined. This patient is currently on statin therapy. I have asked the patient to follow-up with their PCP regarding any incidental finding of coronary artery disease and management with diet or medication as they feel is clinically indicated. The  patient verbalized understanding of the above and had no further questions.     Pamela Spatz, NP 01/07/2019 9:41 AM

## 2019-01-08 DIAGNOSIS — M0589 Other rheumatoid arthritis with rheumatoid factor of multiple sites: Secondary | ICD-10-CM | POA: Diagnosis not present

## 2019-01-08 DIAGNOSIS — R7989 Other specified abnormal findings of blood chemistry: Secondary | ICD-10-CM | POA: Diagnosis not present

## 2019-01-08 DIAGNOSIS — Z79899 Other long term (current) drug therapy: Secondary | ICD-10-CM | POA: Diagnosis not present

## 2019-01-08 MED ORDER — BENAZEPRIL HCL 20 MG PO TABS: 20.0000 mg | ORAL_TABLET | Freq: Every day | ORAL | 3 refills | Status: DC

## 2019-01-08 NOTE — Telephone Encounter (Signed)
Pt's medication was sent to pt's pharmacy as requested. Confirmation received.  °

## 2019-01-12 ENCOUNTER — Encounter: Payer: Medicare Other | Admitting: Acute Care

## 2019-01-12 ENCOUNTER — Ambulatory Visit: Payer: Medicare Other

## 2019-01-14 NOTE — Progress Notes (Signed)
Cardiology Office Note   Date:  01/14/2019   ID:  PAISLYNN HEGSTROM, DOB [birth year], MRN [id number]  PCP:  [name], PA-C  Cardiologist:  Dr. Tamala Julian   No chief complaint on file.   History of Present Illness: Pamela Lowe is a 68 y.o. female who presents for post cath follow-up, seen for Dr. Tamala Julian.   Pamela Lowe has a history of CAD with significant occlusive LAD disease per coronary CTA/FFR performed 08/2018, hyperlipidemia, hypertension and PAD.  Patient was last seen by Dr. Tamala Julian on 11/03/2018 in follow-up after coronary CT performed with reassessment of symptoms.  She was doing well from a cardiac perspective.  Her shortness of breath had completely resolved and was not having anginal symptoms.  CT with FFR performed 08/2018 reviewed which demonstrated severe obstruction in the mid LAD and first diagonal with her right coronary and left circumflex with no evidence of ischemia.  Her exertional dyspnea was thought to be related to her significant CAD.  No cardiac catheterization was ever performed given that she was relatively asymptomatic after starting anti-ischemic and preventative therapies.  She was walking frequently without limitations and not needing nitroglycerin.  Continued medical therapy was preferred at that time per patient.  Was recently seen by her PCP and diagnosed with costochondritis 11/14/2018 and started on NSAID therapy which has not resolved her symptoms.  She was then seen by myself 12/10/2018 and began experiencing right-sided upper chest discomfort that was nonexertional and worse with deep inspiration. We discussed options given her known coronary artery disease per coronary CT scan previously performed and discussed with Dr. Tamala Julian. She reported taking 1 sublingual nitroglycerin with some resolution.  Case discussed with Dr. Tamala Julian who agreed that cardiac cath was her best option.  Cath performed 12/17/2018 which revealed a complex mid LAD diagonal  obstruction with functional total occlusion of LAD beyond the second septal perforator and second diagonal.  Options of PCI versus CABG were discussed.  Ultimately patient underwent second heart cath 12/26/2018 for stent intervention with successful DES to mid segment of the second diagonal and overlapping DES to mid distal LAD.  Patient tolerated procedure well.  Femoral site was well with no hematoma at day of discharge.  Plan was for DAPT with ASA and Plavix indefinitely.   Today, patient presents for follow-up.  She is doing well from a cardiac perspective.  Has had no recurrent anginal symptoms.  Is increasing her activity slowly.  Femoral site looks great with no hematoma.  Does have some upper left abdominal pain which she is contributing to gas.  This is not related her previous anginal symptoms.  She has no associated shortness of breath, PND, orthopnea, LE swelling or dizziness.  Her omeprazole was transitioned to Protonix at hospital discharge secondary to new Plavix.  She is trying antacids and other over-the-counter options with some relief.  Is anticipating working with cardiac rehab and feels that as she begins moving more this is going to help. She does follow with GI for history of known GERD.  Discussed importance of compliance with ASA and Plavix.  Overall is doing very well.   Past Medical History:  Diagnosis Date  . Allergic rhinitis   . Breast cyst 2007   Right  . Cataract    surgery 07/2017  . Depression 2006   w/ menopause  . Family history of breast cancer   . Family history of colon cancer   . Family history of kidney cancer  01/15/2018  . Family history of lung cancer   . Family history of pancreatic cancer   . Family history of prostate cancer   . Family history of uterine cancer   . GERD (gastroesophageal reflux disease)   . Glaucoma   . History of hysterectomy, supracervical   . Hyperlipidemia   . Hypertension   . 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   MLH1  [id number] (Splice site)  . Multinodular goiter   . Tobacco user     Past Surgical History:  Procedure Laterality Date  . BREAST BIOPSY Bilateral 1985  . CATARACT EXTRACTION Right 07/2017  . COLONOSCOPY    . CORONARY STENT INTERVENTION N/A 12/26/2018   Procedure: CORONARY STENT INTERVENTION;  Surgeon: Belva Crome, MD;  Location: [facility];  Service: Cardiovascular;  Laterality: N/A;  . LEFT HEART CATH AND CORONARY ANGIOGRAPHY N/A 12/17/2018   Procedure: LEFT HEART CATH AND CORONARY ANGIOGRAPHY;  Surgeon: Belva Crome, MD;  Location: [facility];  Service: Cardiovascular;  Laterality: N/A;  . SUPRACERVICAL ABDOMINAL HYSTERECTOMY  1986   h/o supracervical hysterectomy  . UPPER GASTROINTESTINAL ENDOSCOPY       Current Outpatient Medications  Medication Sig Dispense Refill  . aspirin (ASPIRIN 81) 81 MG EC tablet Take 81 mg by mouth daily.     . benazepril (LOTENSIN) 20 MG tablet Take 1 tablet (20 mg total) by mouth daily. 90 tablet 3  . brimonidine (ALPHAGAN) 0.2 % ophthalmic solution Place 1 drop into both eyes 3 (three) times daily.     . Calcium Carb-Cholecalciferol (CALCIUM-VITAMIN D) 600-400 MG-UNIT TABS Take 1 tablet by mouth daily.    . cetirizine (ZYRTEC) 10 MG tablet Take 10 mg by mouth daily.    . clopidogrel (PLAVIX) 75 MG tablet Take 1 tablet (75 mg total) by mouth daily. 90 tablet 3  . dorzolamide (TRUSOPT) 2 % ophthalmic solution Place 1 drop into both eyes 2 (two) times daily.     . folic acid (FOLVITE) 1 MG tablet Take 1 mg by mouth daily.     . Golimumab (SIMPONI ARIA IV) Inject 12.5 mLs into the vein every 8 (eight) weeks.     . hydrochlorothiazide (MICROZIDE) 12.5 MG capsule Take 12.5 mg by mouth daily.     . methotrexate (RHEUMATREX) 2.5 MG tablet Take 20 mg by mouth once a week. Mondays    . metoprolol succinate (TOPROL-XL) 25 MG 24 hr tablet Take 1 tablet (25 mg total) by mouth daily. 90 tablet 3  . Multiple Vitamins-Minerals  (MULTIVITAMIN WITH MINERALS) tablet Take 1 tablet by mouth daily.    . nitroGLYCERIN (NITROSTAT) 0.4 MG SL tablet Place 1 tablet (0.4 mg total) under the tongue every 5 (five) minutes as needed for chest pain. 25 tablet 3  . Omega-3 Fatty Acids (FISH OIL) 1000 MG CAPS Take by mouth daily.     . pantoprazole (PROTONIX) 40 MG tablet Take 1 tablet (40 mg total) by mouth daily. 90 tablet 3  . prednisoLONE acetate (PRED FORTE) 1 % ophthalmic suspension Place 1 drop into both eyes 2 (two) times daily.     . predniSONE (DELTASONE) 5 MG tablet Take 5 mg by mouth daily.    . rosuvastatin (CRESTOR) 40 MG tablet Take 1 tablet (40 mg total) by mouth daily. 90 tablet 3   No current facility-administered medications for this visit.     Allergies:   Bupropion, Sulfamethoxazole-trimethoprim, and Wellbutrin [bupropion hcl]    Social History:  The patient  reports  that she quit smoking about 17 months ago. Her smoking use included cigarettes. She has a 58.50 pack-year smoking history. She has never used smokeless tobacco. She reports previous alcohol use. She reports that she does not use drugs.   Family History:  The patient's family history includes Allergies in her brother; Alzheimer's disease in her mother; Brain cancer in her paternal aunt; Breast cancer (age of onset: 70) in her sister; Breast cancer (age of onset: 20) in her sister; Breast cancer (age of onset: 70) in her sister; Breast cancer (age of onset: 56) in her cousin; Breast cancer (age of onset: 79) in her maternal aunt; Clotting disorder in her sister; Colon cancer (age of onset: 25) in her paternal grandfather; Deep vein thrombosis in her mother and sister; Heart attack in her maternal grandfather; Heart attack (age of onset: 13) in her brother; Hypertension in her father and mother; Kidney cancer (age of onset: 79) in her cousin; Kidney cancer (age of onset: 72) in her father; Kidney cancer (age of onset: 26) in her cousin; Lung cancer (age of  onset: 71) in her paternal uncle; Lung cancer (age of onset: 38) in her maternal uncle; Osteopenia in her mother; Pancreatic cancer (age of onset: 3) in her paternal uncle; Pancreatic cancer (age of onset: 101) in her paternal aunt; Pancreatic cancer (age of onset: 63) in her paternal aunt; Pneumonia in her father; Prostate cancer (age of onset: 30) in her cousin; Prostate cancer (age of onset: 37) in her maternal uncle; Uterine cancer (age of onset: 13) in her cousin; Uterine cancer (age of onset: 23) in her paternal grandmother.    ROS:  Please see the history of present illness. Otherwise, review of systems are positive for none.   All other systems are reviewed and negative.    PHYSICAL EXAM: VS:  LMP 03/19/1998 Comment: h/o supracervical hysterectomy , BMI There is no height or weight on file to calculate BMI.   General: Well developed, well nourished, NAD Skin: Warm, dry, intact  Lungs:Clear to ausculation bilaterally. No wheezes, rales, or rhonchi. Breathing is unlabored. Cardiovascular: RRR with S1 S2. No murmurs, rubs, gallops, or LV heave appreciated. Extremities: No edema. No clubbing or cyanosis. DP pulses 2+ bilaterally Neuro: Alert and oriented. No focal deficits. No facial asymmetry. MAE spontaneously. Psych: Responds to questions appropriately with normal affect.     EKG:  EKG is not ordered today.  Recent Labs: 05/22/2018: NT-Pro BNP 37 10/20/2018: ALT 38 12/27/2018: BUN 9; Creatinine, Ser 0.99; Hemoglobin 11.0; Platelets 261; Potassium 3.8; Sodium 141    Lipid Panel    Component Value Date/Time   CHOL 190 10/20/2018 0907   TRIG 103 10/20/2018 0907   HDL 78 10/20/2018 0907   CHOLHDL 2.4 10/20/2018 0907   LDLCALC 91 10/20/2018 0907      Wt Readings from Last 3 Encounters:  01/07/19 235 lb (106.6 kg)  12/27/18 233 lb 4 oz (105.8 kg)  12/17/18 228 lb (103.4 kg)      Other studies Reviewed: Additional studies/ records that were reviewed today include:   Cardiac  catheterization 12/17/2018:   Obstructive coronary artery disease with complex mid LAD diagonal obstruction. There is functional total occlusion of the LAD beyond the second septal perforator and second diagonal. The mid and apical LAD is supplied by collaterals from right to left. The second diagonal contains segmental 90% stenosis. The LAD proximal to the total occlusion contains diffuse disease extending proximal to the origin of the second diagonal and second  septal perforator. A percutaneous approach would cover the ostia of these 2 large branches.  Widely patent circumflex with 3 large obtuse marginals.  Diffusely diseased RCA with 40 to 60% stenosis throughout the mid segment. PDA is relatively small but contains greater than 90% ostial narrowing. The very distal end of the right coronary beyond the first left ventricular branch appears to be totally occluded.  Normal left ventricular function with LVEF 60%. LVEDP normal.  RECOMMENDATIONS:   Treatment options include percutaneous intervention with therapy on the mid diagonal only versus extensive stenting of the LAD stent total occlusion but stents would JL a large diagonal and septal perforator. Considering the disease in the right coronary uppercase could also be made to consider coronary bypass grafting. Will discuss with colleagues.   Cardiac catheterization and PCI 12/26/2018:   Successful stent implantation in the mid segment of the second diagonal reducing 95% stenosis to 0% with TIMI grade III flow using a 2.0 x 18 Onyx postdilated the 2.25 mm in diameter.   Successful overlapping stent implantation in the mid to distal LAD using a 2.5 x 26 stent proximally overlapping a 22 x 2.5 mm stent more distally all postdilated to 2.75 mm. Total occlusion with TIMI grade 0 flow was reduced to 0% with TIMI grade III flow.  RECOMMENDATIONS:   Continue aspirin and clopidogrel indefinitely  Monitor symptoms for continued  angina and if present perhaps the more proximal eccentric disease that involves the second diagonal may need therapy with PCI.  Aggressive preventive therapy aiming to get LDL less than 55.  Discharge in a.m. if no femoral artery complications or other issues overnight.  _____________  Diagnostic Dominance: Right  Intervention      Echo 05/27/18: 1. The left ventricle has normal systolic function with an ejection fraction of 60-65%. The cavity size was normal. There is mildly increased left ventricular wall thickness. Left ventricular diastolic Doppler parameters are consistent with impaired  relaxation. 2. The right ventricle has normal systolic function. The cavity was normal. There is no increase in right ventricular wall thickness. 3. There is mild mitral annular calcification present. 4. Mild thickening of the aortic valve Mild calcification of the aortic valve. 5. There is mild dilatation of the ascending aorta measuring 39 mm.   ASSESSMENT AND PLAN:  1.  CAD s/p DES to D2, overlapping DES to mid distal LAD: -Cath performed 9/30 with complex mid LAD, diagonal obstruction with stent intervention performed 12/26/2018 -Plan for DAPT with ASA and Plavix indefinitely -Site without hematoma or pain -Continue ASA, Plavix, beta-blocker  2.  Hyperlipidemia: -Last LDL, 91 with a goal of less than 55 -Continue Crestor 40 mg -We will repeat lipids early 02/2019 -May need lipid clinic referral for PCSK9 for aggressive LDL reduction  3.  Hypertension: -Stable, 122/72 -Continue Toprol, benazepril, HCTZ  4.  PAD: -No symptoms -Continue ASA, Plavix, statin   Current medicines are reviewed at length with the patient today.  The patient does not have concerns regarding medicines.  The following changes have been made:  no change  Labs/ tests ordered today include: None  No orders of the defined types were placed in this encounter.    Disposition:   FU with Dr.  Tamala Julian in 3 months  Signed, Kathyrn Drown, NP  01/14/2019 6:47 PM    [address] [place], [place], [place]  [address] Phone: [phone]; Fax: [phone]

## 2019-01-15 ENCOUNTER — Encounter

## 2019-01-16 ENCOUNTER — Telehealth: Payer: Self-pay | Admitting: *Deleted

## 2019-01-16 ENCOUNTER — Encounter: Payer: Self-pay | Admitting: Cardiology

## 2019-01-16 ENCOUNTER — Ambulatory Visit (INDEPENDENT_AMBULATORY_CARE_PROVIDER_SITE_OTHER): Payer: Medicare Other | Admitting: Cardiology

## 2019-01-16 ENCOUNTER — Other Ambulatory Visit: Payer: Self-pay

## 2019-01-16 VITALS — BP 122/72 | HR 82 | Ht 67.0 in | Wt 234.0 lb

## 2019-01-16 DIAGNOSIS — E782 Mixed hyperlipidemia: Secondary | ICD-10-CM | POA: Diagnosis not present

## 2019-01-16 DIAGNOSIS — R0600 Dyspnea, unspecified: Secondary | ICD-10-CM

## 2019-01-16 DIAGNOSIS — I739 Peripheral vascular disease, unspecified: Secondary | ICD-10-CM

## 2019-01-16 DIAGNOSIS — R0609 Other forms of dyspnea: Secondary | ICD-10-CM

## 2019-01-16 DIAGNOSIS — I25118 Atherosclerotic heart disease of native coronary artery with other forms of angina pectoris: Secondary | ICD-10-CM | POA: Diagnosis not present

## 2019-01-16 DIAGNOSIS — I10 Essential (primary) hypertension: Secondary | ICD-10-CM

## 2019-01-16 NOTE — Telephone Encounter (Signed)
Call to patient. Per DPR, can leave message on voice mail which has name confirmation. Left message calling to see how she is post cardiac procedure and follow-up on plan for surgery. Requested call back with update.

## 2019-01-16 NOTE — Patient Instructions (Signed)
Medication Instructions:  Your physician recommends that you continue on your current medications as directed. Please refer to the Current Medication list given to you today.  Labwork: None ordered.  Testing/Procedures: None ordered.  Follow-Up: Your physician recommends that you schedule a follow-up appointment in:   3 months with Dr. Tamala Julian  Any Other Special Instructions Will Be Listed Below (If Applicable).     If you need a refill on your cardiac medications before your next appointment, please call your pharmacy.

## 2019-01-18 ENCOUNTER — Encounter: Payer: Self-pay | Admitting: Hematology

## 2019-01-19 ENCOUNTER — Telehealth (HOSPITAL_COMMUNITY): Payer: Self-pay

## 2019-01-19 NOTE — Telephone Encounter (Signed)
Patient returning call to [facility]. States she had cardiac catheterization and has to go for cardiac rehab. She will call back regarding procedure once she completes rehab.

## 2019-01-19 NOTE — Telephone Encounter (Signed)
Called patient to see if she was interested in participating in the Cardiac Rehab Program. Patient stated yes. Patient will come in for orientation on 01/29/2019 @ 130PM and will attend the 315PM exercise class.  Tourist information centre manager.

## 2019-01-19 NOTE — Telephone Encounter (Signed)
Patient was to schedule Lap BSO due to genetic risk for ovarian cancer.  Will defer till patient calls back.   Routing to Dr [name]. Encounter closed.

## 2019-01-20 DIAGNOSIS — M2550 Pain in unspecified joint: Secondary | ICD-10-CM | POA: Diagnosis not present

## 2019-01-20 DIAGNOSIS — M0589 Other rheumatoid arthritis with rheumatoid factor of multiple sites: Secondary | ICD-10-CM | POA: Diagnosis not present

## 2019-01-20 DIAGNOSIS — R7989 Other specified abnormal findings of blood chemistry: Secondary | ICD-10-CM | POA: Diagnosis not present

## 2019-01-20 DIAGNOSIS — H209 Unspecified iridocyclitis: Secondary | ICD-10-CM | POA: Diagnosis not present

## 2019-01-20 DIAGNOSIS — Z79899 Other long term (current) drug therapy: Secondary | ICD-10-CM | POA: Diagnosis not present

## 2019-01-21 ENCOUNTER — Other Ambulatory Visit: Payer: Self-pay

## 2019-01-21 ENCOUNTER — Telehealth: Payer: Self-pay

## 2019-01-21 DIAGNOSIS — N2889 Other specified disorders of kidney and ureter: Secondary | ICD-10-CM

## 2019-01-21 NOTE — Telephone Encounter (Signed)
Spoke with patient regarding My Chart message she sent with concern over the lesion that showed up on her left kidney during a pulmonology study.  Per Dr. Burr Medico she would like to obtain a CT scan of abdomen and pelvis with contrast to further evaluate this.  Also we will be scheduling her for a lab appointment and then a virtual appointment to discuss the findings. I explained I will be getting back to her with these appointments once the scan is approved.   The patient verbalized an understanding and was very appreciative of the call.

## 2019-01-27 ENCOUNTER — Telehealth (HOSPITAL_COMMUNITY): Payer: Self-pay | Admitting: Pharmacist

## 2019-01-27 DIAGNOSIS — R21 Rash and other nonspecific skin eruption: Secondary | ICD-10-CM | POA: Diagnosis not present

## 2019-01-27 DIAGNOSIS — Z23 Encounter for immunization: Secondary | ICD-10-CM | POA: Diagnosis not present

## 2019-01-28 NOTE — Telephone Encounter (Signed)
Cardiac Rehab Medication Review by a Pharmacist  Does the patient feel that his/her medications are working for him/her?  yes  Has the patient been experiencing any side effects to the medications prescribed?  yes Methotrexate - skin irritation - PCP has referred pt to dermatologist   Does the patient measure his/her own blood pressure or blood glucose at home?  no   Does the patient have any problems obtaining medications due to transportation or finances?   no  Understanding of regimen: excellent Understanding of indications: excellent Potential of compliance: excellent   Lorel Monaco, PharmD PGY1 Ambulatory Care Resident [facility] # [phone]

## 2019-01-29 ENCOUNTER — Encounter (HOSPITAL_COMMUNITY)
Admission: RE | Admit: 2019-01-29 | Discharge: 2019-01-29 | Disposition: A | Discharge status: Home / Self Care | Payer: Medicare Other | Source: Ambulatory Visit | Attending: Interventional Cardiology | Admitting: Interventional Cardiology

## 2019-01-29 ENCOUNTER — Other Ambulatory Visit: Payer: Self-pay

## 2019-01-29 ENCOUNTER — Encounter (HOSPITAL_COMMUNITY): Payer: Self-pay

## 2019-01-29 VITALS — BP 104/60 | HR 73 | Temp 95.3°F | Ht 67.25 in | Wt 231.7 lb

## 2019-01-29 DIAGNOSIS — Z955 Presence of coronary angioplasty implant and graft: Secondary | ICD-10-CM | POA: Insufficient documentation

## 2019-01-29 NOTE — Progress Notes (Signed)
Cardiac Individual Treatment Plan  Patient Details  Name: Pamela Lowe MRN: [id number] Date of Birth: Feb 04, 1951 Referring Provider:     CARDIAC REHAB PHASE II ORIENTATION from 01/29/2019 in [place]  Referring Provider  Dr. Tamala Julian      Initial Encounter Date:    CARDIAC REHAB PHASE II ORIENTATION from 01/29/2019 in [place]  Date  01/29/19      Visit Diagnosis: S/P coronary artery stent placement S/P DES Mid Distal LAD 12/26/18  Patient's Home Medications on Admission:  Current Outpatient Medications:    aspirin (ASPIRIN 81) 81 MG EC tablet, Take 81 mg by mouth daily. , Disp: , Rfl:    benazepril (LOTENSIN) 20 MG tablet, Take 1 tablet (20 mg total) by mouth daily., Disp: 90 tablet, Rfl: 3   brimonidine (ALPHAGAN) 0.2 % ophthalmic solution, Place 1 drop into both eyes 3 (three) times daily. , Disp: , Rfl:    Calcium Carb-Cholecalciferol (CALCIUM-VITAMIN D) 600-400 MG-UNIT TABS, Take 1 tablet by mouth daily., Disp: , Rfl:    cetirizine (ZYRTEC) 10 MG tablet, Take 10 mg by mouth daily., Disp: , Rfl:    clopidogrel (PLAVIX) 75 MG tablet, Take 1 tablet (75 mg total) by mouth daily., Disp: 90 tablet, Rfl: 3   dorzolamide (TRUSOPT) 2 % ophthalmic solution, Place 1 drop into both eyes 2 (two) times daily. , Disp: , Rfl:    folic acid (FOLVITE) 1 MG tablet, Take 1 mg by mouth daily. , Disp: , Rfl:    Golimumab (SIMPONI ARIA IV), Inject 12.5 mLs into the vein every 8 (eight) weeks. , Disp: , Rfl:    hydrochlorothiazide (MICROZIDE) 12.5 MG capsule, Take 12.5 mg by mouth daily. , Disp: , Rfl:    methotrexate (RHEUMATREX) 2.5 MG tablet, Take 22.5 mg by mouth once a week. Mondays, Disp: , Rfl:    metoprolol succinate (TOPROL-XL) 25 MG 24 hr tablet, Take 1 tablet (25 mg total) by mouth daily., Disp: 90 tablet, Rfl: 3   Multiple Vitamins-Minerals (MULTIVITAMIN WITH MINERALS) tablet, Take 1 tablet by mouth daily.,  Disp: , Rfl:    nitroGLYCERIN (NITROSTAT) 0.4 MG SL tablet, Place 1 tablet (0.4 mg total) under the tongue every 5 (five) minutes as needed for chest pain., Disp: 25 tablet, Rfl: 3   Omega-3 Fatty Acids (FISH OIL) 1000 MG CAPS, Take by mouth daily. , Disp: , Rfl:    pantoprazole (PROTONIX) 40 MG tablet, Take 1 tablet (40 mg total) by mouth daily., Disp: 90 tablet, Rfl: 3   prednisoLONE acetate (PRED FORTE) 1 % ophthalmic suspension, Place 1 drop into both eyes 2 (two) times daily. , Disp: , Rfl:    predniSONE (DELTASONE) 5 MG tablet, Take 5 mg by mouth daily., Disp: , Rfl:    rosuvastatin (CRESTOR) 40 MG tablet, Take 1 tablet (40 mg total) by mouth daily., Disp: 90 tablet, Rfl: 3  Past Medical History: Past Medical History:  Diagnosis Date   Allergic rhinitis    Breast cyst 2007   Right   Cataract    surgery 07/2017   Depression 2006   w/ menopause   Family history of breast cancer    Family history of colon cancer    Family history of kidney cancer 01/15/2018   Family history of lung cancer    Family history of pancreatic cancer    Family history of prostate cancer    Family history of uterine cancer    GERD (gastroesophageal  reflux disease)    Glaucoma    History of hysterectomy, supracervical    Hyperlipidemia    Hypertension    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   MLH1 [id number] (Splice site)   Multinodular goiter    Tobacco user     Tobacco Use: Social History   Tobacco Use  Smoking Status Former Smoker   Packs/day: 1.50   Years: 39.00   Pack years: 58.50   Types: Cigarettes   Quit date: 08/01/2017   Years since quitting: 1.4  Smokeless Tobacco Never Used  Tobacco Comment   using nicoderm patches    Labs: Recent Review Flowsheet Data    Labs for ITP Cardiac and Pulmonary Rehab Latest Ref Rng & Units 09/23/2017 10/20/2018   Cholestrol 100 - 199 mg/dL - 190   LDLCALC 0 - 99 mg/dL - 91   HDL >39 mg/dL - 78   Trlycerides 0 - 149  mg/dL - 103   TCO2 22 - 32 mmol/L 27 -      Capillary Blood Glucose: No results found for: GLUCAP   Exercise Target Goals: Exercise Program Goal: Individual exercise prescription set using results from initial 6 min walk test and THRR while considering  patients activity barriers and safety.   Exercise Prescription Goal: Starting with aerobic activity 30 plus minutes a day, 3 days per week for initial exercise prescription. Provide home exercise prescription and guidelines that participant acknowledges understanding prior to discharge.  Activity Barriers & Risk Stratification: Activity Barriers & Cardiac Risk Stratification - 01/29/19 1456      Activity Barriers & Cardiac Risk Stratification   Activity Barriers  Muscular Weakness;Deconditioning    Cardiac Risk Stratification  Moderate       6 Minute Walk: 6 Minute Walk    Row Name 01/29/19 1455         6 Minute Walk   Phase  Initial     Distance  1364 feet     Walk Time  6 minutes     # of Rest Breaks  0     MPH  2.6     METS  2.58     RPE  12     Perceived Dyspnea   0     VO2 Peak  9.03     Symptoms  No     Resting HR  73 bpm     Resting BP  104/60     Resting Oxygen Saturation   98 %     Exercise Oxygen Saturation  during 6 min walk  99 %     Max Ex. HR  96 bpm     Max Ex. BP  122/72     2 Minute Post BP  114/70        Oxygen Initial Assessment:   Oxygen Re-Evaluation:   Oxygen Discharge (Final Oxygen Re-Evaluation):   Initial Exercise Prescription: Initial Exercise Prescription - 01/29/19 1500      Date of Initial Exercise RX and Referring Provider   Date  01/29/19    Referring Provider  Dr. Tamala Julian    Expected Discharge Date  03/27/19      Recumbant Bike   Level  2    Watts  20    Minutes  15    METs  2.6      NuStep   Level  3    SPM  85    Minutes  15    METs  2.5      Prescription Details  Frequency (times per week)  3    Duration  Progress to 30 minutes of continuous aerobic  without signs/symptoms of physical distress      Intensity   THRR 40-80% of Max Heartrate  61-122    Ratings of Perceived Exertion  11-13      Progression   Progression  Continue to progress workloads to maintain intensity without signs/symptoms of physical distress.      Resistance Training   Training Prescription  Yes    Weight  3 lbs.     Reps  10-15       Perform Capillary Blood Glucose checks as needed.  Exercise Prescription Changes:   Exercise Comments:   Exercise Goals and Review: Exercise Goals    Row Name 01/29/19 1502             Exercise Goals   Increase Physical Activity  Yes       Intervention  Provide advice, education, support and counseling about physical activity/exercise needs.;Develop an individualized exercise prescription for aerobic and resistive training based on initial evaluation findings, risk stratification, comorbidities and participant's personal goals.       Expected Outcomes  Short Term: Attend rehab on a regular basis to increase amount of physical activity.;Long Term: Add in home exercise to make exercise part of routine and to increase amount of physical activity.;Long Term: Exercising regularly at least 3-5 days a week.       Increase Strength and Stamina  Yes       Intervention  Provide advice, education, support and counseling about physical activity/exercise needs.;Develop an individualized exercise prescription for aerobic and resistive training based on initial evaluation findings, risk stratification, comorbidities and participant's personal goals.       Expected Outcomes  Short Term: Increase workloads from initial exercise prescription for resistance, speed, and METs.;Short Term: Perform resistance training exercises routinely during rehab and add in resistance training at home;Long Term: Improve cardiorespiratory fitness, muscular endurance and strength as measured by increased METs and functional capacity (6MWT)       Able to  understand and use rate of perceived exertion (RPE) scale  Yes       Intervention  Provide education and explanation on how to use RPE scale       Expected Outcomes  Short Term: Able to use RPE daily in rehab to express subjective intensity level;Long Term:  Able to use RPE to guide intensity level when exercising independently       Knowledge and understanding of Target Heart Rate Range (THRR)  Yes       Intervention  Provide education and explanation of THRR including how the numbers were predicted and where they are located for reference       Expected Outcomes  Short Term: Able to state/look up THRR;Long Term: Able to use THRR to govern intensity when exercising independently;Short Term: Able to use daily as guideline for intensity in rehab       Able to check pulse independently  Yes       Intervention  Provide education and demonstration on how to check pulse in carotid and radial arteries.;Review the importance of being able to check your own pulse for safety during independent exercise       Expected Outcomes  Short Term: Able to explain why pulse checking is important during independent exercise;Long Term: Able to check pulse independently and accurately       Understanding of Exercise Prescription  Yes  Intervention  Provide education, explanation, and written materials on patient's individual exercise prescription       Expected Outcomes  Short Term: Able to explain program exercise prescription;Long Term: Able to explain home exercise prescription to exercise independently          Exercise Goals Re-Evaluation :    Discharge Exercise Prescription (Final Exercise Prescription Changes):   Nutrition:  Target Goals: Understanding of nutrition guidelines, daily intake of sodium <1513m, cholesterol <2017m calories 30% from fat and 7% or less from saturated fats, daily to have 5 or more servings of fruits and vegetables.  Biometrics: Pre Biometrics - 01/29/19 1503      Pre  Biometrics   Height  5' 7.25" (1.708 m)    Weight  105.1 kg    Waist Circumference  46.5 inches    Hip Circumference  49.5 inches    Waist to Hip Ratio  0.94 %    BMI (Calculated)  36.03    Triceps Skinfold  45 mm    % Body Fat  49.4 %    Grip Strength  38 kg    Flexibility  19.75 in    Single Leg Stand  10 seconds        Nutrition Therapy Plan and Nutrition Goals:   Nutrition Assessments:   Nutrition Goals Re-Evaluation:   Nutrition Goals Discharge (Final Nutrition Goals Re-Evaluation):   Psychosocial: Target Goals: Acknowledge presence or absence of significant depression and/or stress, maximize coping skills, provide positive support system. Participant is able to verbalize types and ability to use techniques and skills needed for reducing stress and depression.  Initial Review & Psychosocial Screening: Initial Psych Review & Screening - 01/29/19 1423      Initial Review   Current issues with  Current Stress Concerns    Source of Stress Concerns  Family;Chronic Illness    Comments  Patient has chronic uveitis and her sister passed away on 1121-Nov-2020     Family Dynamics   Good Support System?  Yes   Pamela Lowe has her husband for support   Comments  Pamela Lowe has a lot of stressors both health related and family related due to the passing of her sister. Patient was offered to meet with the hospital chaplain. Pamela Lowe declined at this time.      Barriers   Psychosocial barriers to participate in program  The patient should benefit from training in stress management and relaxation.      Screening Interventions   Interventions  Encouraged to exercise;To provide support and resources with identified psychosocial needs    Expected Outcomes  Long Term Goal: Stressors or current issues are controlled or eliminated.       Quality of Life Scores: Quality of Life - 01/29/19 1512      Quality of Life   Select  Quality of Life      Quality of Life Scores   Health/Function Pre   19.3 %    Socioeconomic Pre  24.36 %    Psych/Spiritual Pre  21.21 %    Family Pre  18 %    GLOBAL Pre  20.62 %      Scores of 19 and below usually indicate a poorer quality of life in these areas.  A difference of  2-3 points is a clinically meaningful difference.  A difference of 2-3 points in the total score of the Quality of Life Index has been associated with significant improvement in overall quality of life, self-image, physical symptoms, and  general health in studies assessing change in quality of life.  PHQ-9: Recent Review Flowsheet Data    Depression screen [facility] 2/9 01/29/2019   Decreased Interest 0   Down, Depressed, Hopeless 1    PHQ - 2 Score 1     Interpretation of Total Score  Total Score Depression Severity:  1-4 = Minimal depression, 5-9 = Mild depression, 10-14 = Moderate depression, 15-19 = Moderately severe depression, 20-27 = Severe depression   Psychosocial Evaluation and Intervention:   Psychosocial Re-Evaluation:   Psychosocial Discharge (Final Psychosocial Re-Evaluation):   Vocational Rehabilitation: Provide vocational rehab assistance to qualifying candidates.   Vocational Rehab Evaluation & Intervention: Vocational Rehab - 01/29/19 1429      Initial Vocational Rehab Evaluation & Intervention   Assessment shows need for Vocational Rehabilitation  No       Education: Education Goals: Education classes will be provided on a weekly basis, covering required topics. Participant will state understanding/return demonstration of topics presented.  Learning Barriers/Preferences: Learning Barriers/Preferences - 01/29/19 1509      Learning Barriers/Preferences   Learning Barriers  Sight    Learning Preferences  Verbal Instruction;Skilled Demonstration;Individual Instruction       Education Topics: Hypertension, Hypertension Reduction -Define heart disease and high blood pressure. Discus how high blood pressure affects the body and ways to reduce  high blood pressure.   Exercise and Your Heart -Discuss why it is important to exercise, the FITT principles of exercise, normal and abnormal responses to exercise, and how to exercise safely.   Angina -Discuss definition of angina, causes of angina, treatment of angina, and how to decrease risk of having angina.   Cardiac Medications -Review what the following cardiac medications are used for, how they affect the body, and side effects that may occur when taking the medications.  Medications include Aspirin, Beta blockers, calcium channel blockers, ACE Inhibitors, angiotensin receptor blockers, diuretics, digoxin, and antihyperlipidemics.   Congestive Heart Failure -Discuss the definition of CHF, how to live with CHF, the signs and symptoms of CHF, and how keep track of weight and sodium intake.   Heart Disease and Intimacy -Discus the effect sexual activity has on the heart, how changes occur during intimacy as we age, and safety during sexual activity.   Smoking Cessation / COPD -Discuss different methods to quit smoking, the health benefits of quitting smoking, and the definition of COPD.   Nutrition I: Fats -Discuss the types of cholesterol, what cholesterol does to the heart, and how cholesterol levels can be controlled.   Nutrition II: Labels -Discuss the different components of food labels and how to read food label   Heart Parts/Heart Disease and PAD -Discuss the anatomy of the heart, the pathway of blood circulation through the heart, and these are affected by heart disease.   Stress I: Signs and Symptoms -Discuss the causes of stress, how stress may lead to anxiety and depression, and ways to limit stress.   Stress II: Relaxation -Discuss different types of relaxation techniques to limit stress.   Warning Signs of Stroke / TIA -Discuss definition of a stroke, what the signs and symptoms are of a stroke, and how to identify when someone is having  stroke.   Knowledge Questionnaire Score: Knowledge Questionnaire Score - 01/29/19 1458      Knowledge Questionnaire Score   Pre Score  22/24       Core Components/Risk Factors/Patient Goals at Admission: Personal Goals and Risk Factors at Admission - 01/29/19 1601  Core Components/Risk Factors/Patient Goals on Admission    Weight Management  Yes;Obesity;Weight Loss;Weight Maintenance    Intervention  Weight Management: Provide education and appropriate resources to help participant work on and attain dietary goals.;Weight Management/Obesity: Establish reasonable short term and long term weight goals.;Obesity: Provide education and appropriate resources to help participant work on and attain dietary goals.    Expected Outcomes  Short Term: Continue to assess and modify interventions until short term weight is achieved;Long Term: Adherence to nutrition and physical activity/exercise program aimed toward attainment of established weight goal;Weight Loss: Understanding of general recommendations for a balanced deficit meal plan, which promotes 1-2 lb weight loss per week and includes a negative energy balance of [phone] kcal/d;Understanding recommendations for meals to include 15-35% energy as protein, 25-35% energy from fat, 35-60% energy from carbohydrates, less than 258m of dietary cholesterol, 20-35 gm of total fiber daily;Understanding of distribution of calorie intake throughout the day with the consumption of 4-5 meals/snacks;Weight Gain: Understanding of general recommendations for a high calorie, high protein meal plan that promotes weight gain by distributing calorie intake throughout the day with the consumption for 4-5 meals, snacks, and/or supplements    Hypertension  Yes    Intervention  Provide education on lifestyle modifcations including regular physical activity/exercise, weight management, moderate sodium restriction and increased consumption of fresh fruit, vegetables, and  low fat dairy, alcohol moderation, and smoking cessation.;Monitor prescription use compliance.    Expected Outcomes  Short Term: Continued assessment and intervention until BP is < 140/946mHG in hypertensive participants. < 130/8052mG in hypertensive participants with diabetes, heart failure or chronic kidney disease.;Long Term: Maintenance of blood pressure at goal levels.    Lipids  Yes    Intervention  Provide education and support for participant on nutrition & aerobic/resistive exercise along with prescribed medications to achieve LDL <30m58mDL >40mg59m Expected Outcomes  Short Term: Participant states understanding of desired cholesterol values and is compliant with medications prescribed. Participant is following exercise prescription and nutrition guidelines.;Long Term: Cholesterol controlled with medications as prescribed, with individualized exercise RX and with personalized nutrition plan. Value goals: LDL < 30mg,56m > 40 mg.    Stress  Yes    Intervention  Refer participants experiencing significant psychosocial distress to appropriate mental health specialists for further evaluation and treatment. When possible, include family members and significant others in education/counseling sessions.    Expected Outcomes  Short Term: Participant demonstrates changes in health-related behavior, relaxation and other stress management skills, ability to obtain effective social support, and compliance with psychotropic medications if prescribed.;Long Term: Emotional wellbeing is indicated by absence of clinically significant psychosocial distress or social isolation.       Core Components/Risk Factors/Patient Goals Review:    Core Components/Risk Factors/Patient Goals at Discharge (Final Review):    ITP Comments: ITP Comments    Row Name 01/29/19 1422           ITP Comments  Dr Traci Fransico Himedical Director          Comments: Pamela Lowe Cardded orientation on 01/29/2019 to review  rules and guidelines for program.  Completed 6 minute walk test, Intitial ITP, and exercise prescription.  VSS. Telemetry-Sinus Rhythm with arrhythmia, rare PVC's this has been previously documented Asymptomatic. Safety measures and social distancing in place per CDC guidelines. [place] and offered to set appointment to meet with the hospital chaplain. Pamela Lowe declined at this time but  will let staff know if she changes her mind. Pamela Lowe is also having an exacerbation of her uveitis and has an appointment to follow up with her eye doctor on Monday regarding this.Barnet Pall, RN,BSN 01/29/2019 4:13 PM

## 2019-01-30 ENCOUNTER — Ambulatory Visit (HOSPITAL_COMMUNITY)
Admission: RE | Admit: 2019-01-30 | Discharge: 2019-01-30 | Disposition: A | Discharge status: Home / Self Care | Payer: Medicare Other | Source: Ambulatory Visit | Attending: Hematology | Admitting: Hematology

## 2019-01-30 DIAGNOSIS — N281 Cyst of kidney, acquired: Secondary | ICD-10-CM | POA: Diagnosis not present

## 2019-01-30 DIAGNOSIS — N2889 Other specified disorders of kidney and ureter: Secondary | ICD-10-CM | POA: Diagnosis not present

## 2019-01-30 MED ORDER — IOHEXOL 300 MG/ML  SOLN
100.0000 mL | Freq: Once | INTRAMUSCULAR | Status: AC | PRN
  Administered 2019-01-30: 100 mL via INTRAVENOUS

## 2019-02-02 ENCOUNTER — Encounter (HOSPITAL_COMMUNITY): Payer: Medicare Other

## 2019-02-02 ENCOUNTER — Telehealth (HOSPITAL_COMMUNITY): Payer: Self-pay | Admitting: Family Medicine

## 2019-02-02 DIAGNOSIS — H209 Unspecified iridocyclitis: Secondary | ICD-10-CM | POA: Diagnosis not present

## 2019-02-02 DIAGNOSIS — H1131 Conjunctival hemorrhage, right eye: Secondary | ICD-10-CM | POA: Diagnosis not present

## 2019-02-03 ENCOUNTER — Telehealth: Payer: Self-pay | Admitting: *Deleted

## 2019-02-03 NOTE — Telephone Encounter (Signed)
Called pt & informed of benign cyst per CT & per Dr Burr Medico no need to f/u.  She reports relief & appreciation for call.

## 2019-02-03 NOTE — Telephone Encounter (Signed)
-----   Message from [name], MD sent at 02/03/2019  9:30 AM EST ----- Please let pt know her CT result, left renal lesion is a benign cyst, no need f/u, thanks   [name]

## 2019-02-04 ENCOUNTER — Encounter (HOSPITAL_COMMUNITY): Payer: Medicare Other

## 2019-02-06 ENCOUNTER — Encounter (HOSPITAL_COMMUNITY): Payer: Medicare Other

## 2019-02-09 ENCOUNTER — Other Ambulatory Visit: Payer: Self-pay

## 2019-02-09 ENCOUNTER — Encounter (HOSPITAL_COMMUNITY)
Admission: RE | Admit: 2019-02-09 | Discharge: 2019-02-09 | Disposition: A | Discharge status: Home / Self Care | Payer: Medicare Other | Source: Ambulatory Visit | Attending: Interventional Cardiology | Admitting: Interventional Cardiology

## 2019-02-09 DIAGNOSIS — Z955 Presence of coronary angioplasty implant and graft: Secondary | ICD-10-CM

## 2019-02-10 NOTE — Progress Notes (Signed)
Daily Session Note  Patient Details  Name: Pamela Lowe MRN: [id number] Date of Birth: [birth year] Referring Provider:     CARDIAC REHAB PHASE II ORIENTATION from 01/29/2019 in [place]  Referring Provider  Dr. Tamala Julian      Encounter Date: 02/09/2019  Check In: Session Check In - 02/09/19 1527      Check-In   Supervising physician immediately available to respond to emergencies  [facility] immediately available    Physician(s)  Dr. Wyline Copas    Location  [facility]    Staff Present  Seward Carol, MS, ACSM CEP, Exercise Physiologist;Angelissa Supan Rollene Rotunda, RN, Toma Deiters, RN, Mosie Epstein, MS,ACSM CEP, Exercise Physiologist    Virtual Visit  No    Medication changes reported      No    Fall or balance concerns reported     No    Tobacco Cessation  No Change    Warm-up and Cool-down  Performed on first and last piece of equipment    Resistance Training Performed  Yes    VAD Patient?  No    PAD/SET Patient?  No      Pain Assessment   Currently in Pain?  No/denies    Pain Score  0-No pain       Capillary Blood Glucose: No results found for this or any previous visit (from the past 24 hour(s)).  Exercise Prescription Changes - 02/09/19 1519      Response to Exercise   Blood Pressure (Admit)  112/60    Blood Pressure (Exercise)  134/76    Blood Pressure (Exit)  98/56    Heart Rate (Admit)  87 bpm    Heart Rate (Exercise)  109 bpm    Heart Rate (Exit)  87 bpm    Rating of Perceived Exertion (Exercise)  13    Symptoms  none    Comments  Off to a great start with exercise.    Duration  Progress to 30 minutes of  aerobic without signs/symptoms of physical distress    Intensity  THRR unchanged      Progression   Progression  Continue to progress workloads to maintain intensity without signs/symptoms of physical distress.    Average METs  2.6      Resistance Training   Training Prescription  Yes    Weight  3 lbs.      Reps  10-15    Time  10 Minutes      Interval Training   Interval Training  No      Bike   Level  2    Minutes  7    METs  2.8      Recumbant Bike   Level  --    Watts  --    Minutes  --    METs  --      NuStep   Level  3    SPM  85    Minutes  23    METs  2.3       Social History   Tobacco Use  Smoking Status Former Smoker  . Packs/day: 1.50  . Years: 39.00  . Pack years: 58.50  . Types: Cigarettes  . Quit date: 08/01/2017  . Years since quitting: 1.5  Smokeless Tobacco Never Used  Tobacco Comment   using nicoderm patches    Goals Met:  Exercise tolerated well No report of cardiac concerns or symptoms Strength training completed today  Goals Unmet:  Not Applicable  Comments: Pt started cardiac rehab today.  Pt tolerated light exercise without difficulty. VSS, telemetry-NSR, asymptomatic.  Medication list reconciled. Pt denies barriers to medicaiton compliance.  PSYCHOSOCIAL ASSESSMENT:  PHQ-1. Pt exhibits positive coping skills, hopeful outlook with supportive family. No psychosocial needs identified at this time, no psychosocial interventions necessary except to continue to provide encouragement and emotional support as needed.  Pt oriented to exercise equipment and routine. Understanding verbalized.   Dr. Fransico Him is Medical Director for Cardiac Rehab at [facility].

## 2019-02-11 ENCOUNTER — Encounter (HOSPITAL_COMMUNITY)
Admission: RE | Admit: 2019-02-11 | Discharge: 2019-02-11 | Disposition: A | Discharge status: Home / Self Care | Payer: Medicare Other | Source: Ambulatory Visit | Attending: Interventional Cardiology | Admitting: Interventional Cardiology

## 2019-02-11 ENCOUNTER — Other Ambulatory Visit: Payer: Self-pay

## 2019-02-11 DIAGNOSIS — Z955 Presence of coronary angioplasty implant and graft: Secondary | ICD-10-CM | POA: Diagnosis not present

## 2019-02-13 ENCOUNTER — Encounter (HOSPITAL_COMMUNITY): Payer: Medicare Other

## 2019-02-16 ENCOUNTER — Other Ambulatory Visit: Payer: Self-pay

## 2019-02-16 ENCOUNTER — Encounter (HOSPITAL_COMMUNITY)
Admission: RE | Admit: 2019-02-16 | Discharge: 2019-02-16 | Disposition: A | Discharge status: Home / Self Care | Payer: Medicare Other | Source: Ambulatory Visit | Attending: Interventional Cardiology | Admitting: Interventional Cardiology

## 2019-02-16 DIAGNOSIS — Z955 Presence of coronary angioplasty implant and graft: Secondary | ICD-10-CM | POA: Diagnosis not present

## 2019-02-18 ENCOUNTER — Other Ambulatory Visit: Payer: Self-pay

## 2019-02-18 ENCOUNTER — Encounter (HOSPITAL_COMMUNITY)
Admission: RE | Admit: 2019-02-18 | Discharge: 2019-02-18 | Disposition: A | Discharge status: Home / Self Care | Payer: Medicare Other | Source: Ambulatory Visit | Attending: Interventional Cardiology | Admitting: Interventional Cardiology

## 2019-02-18 DIAGNOSIS — Z955 Presence of coronary angioplasty implant and graft: Secondary | ICD-10-CM

## 2019-02-19 NOTE — Progress Notes (Signed)
Pamela Lowe 68 y.o. female Nutrition Note Spoke with pt. Nutrition Plan and Nutrition Survey goals reviewed with pt. Pt is following a Heart Healthy diet. Pt wants to lose wt. Pt has been trying to lose wt by using "Diet to go" (meal delivery service). Current self determined calorie goal 1200 kcals. Lost 9 lbs in 4 weeks, self reported. Discussed adequate calorie intake. Pt previously was using an app on her phone to track calories. Discussed tracking calories for a few weeks to determine best calorie goal for weight loss of 1-2 lbs per week. Wt loss tips reviewed (label reading, how to build a healthy plate, portion sizes, eating frequently across the day).  Pt expressed understanding of the information reviewed.   No results found for: HGBA1C  Wt Readings from Last 3 Encounters:  01/29/19 231 lb 11.3 oz (105.1 kg)  01/16/19 234 lb (106.1 kg)  01/07/19 235 lb (106.6 kg)    Nutrition Diagnosis ? Food-and nutrition-related knowledge deficit related to lack of exposure to information as related to diagnosis of: ? CVD ?  ? Obese  II = 35-39.9 related to excessive energy intake as evidenced by a 35.9  Nutrition Intervention ? Pt's individual nutrition plan reviewed with pt. ? Benefits of adopting Heart Healthy diet discussed when [employer] reviewed.   ? Continue client-centered nutrition education by RD, as part of interdisciplinary care.  Goal(s) ? Pt to identify and limit food sources of saturated fat, trans fat, refined carbohydrates and sodium ? Pt to identify food quantities necessary to achieve weight loss of 6-24 lb at graduation from cardiac rehab.   Plan:   Will provide client-centered nutrition education as part of interdisciplinary care  Monitor and evaluate progress toward nutrition goal with team.   Michaele Offer, MS, RDN, LDN

## 2019-02-20 ENCOUNTER — Telehealth: Payer: Self-pay | Admitting: Physician Assistant

## 2019-02-20 ENCOUNTER — Encounter (HOSPITAL_COMMUNITY)
Admission: RE | Admit: 2019-02-20 | Discharge: 2019-02-20 | Disposition: A | Discharge status: Home / Self Care | Payer: Medicare Other | Source: Ambulatory Visit | Attending: Interventional Cardiology | Admitting: Interventional Cardiology

## 2019-02-20 ENCOUNTER — Other Ambulatory Visit: Payer: Self-pay

## 2019-02-20 DIAGNOSIS — Z955 Presence of coronary angioplasty implant and graft: Secondary | ICD-10-CM

## 2019-02-20 NOTE — Progress Notes (Signed)
Patient has had some resting and exertional systolic blood pressures in the 90's at cardiac rehab. Patient has been asymptomatic. Exit blood pressure 97/62 post exercise today. Madell has lost 3 kg since starting cardiac rehab on 02/09/19. Patient advised to make sure that she is drinking enough water through out the day. Medications reviewed. Fabian Sharp PA paged and notified. No new order received. Will fax exercise flow sheets to Dr. Thompson Caul office for review. Patient left cardiac rehab without complaints.Will continue to monitor the patient throughout  the program.Maria Venetia Maxon, RN,BSN 02/20/2019 4:46 PM

## 2019-02-20 NOTE — Telephone Encounter (Signed)
Page received from cardiac rehab. Patient in cardiac rehab is reportedly hypotensive but asymptomatic. I advised that if he is symptomatic, he should go to the ER. I advised that DOD at [facility] can be called. Otherwise, message should be sent for first available appointment so that medications and BP can be discussed. I will forward to Dr. Tamala Julian.

## 2019-02-23 ENCOUNTER — Telehealth: Payer: Self-pay | Admitting: *Deleted

## 2019-02-23 ENCOUNTER — Encounter (HOSPITAL_COMMUNITY)
Admission: RE | Admit: 2019-02-23 | Discharge: 2019-02-23 | Disposition: A | Discharge status: Home / Self Care | Payer: Medicare Other | Source: Ambulatory Visit | Attending: Interventional Cardiology | Admitting: Interventional Cardiology

## 2019-02-23 ENCOUNTER — Other Ambulatory Visit: Payer: Self-pay | Admitting: *Deleted

## 2019-02-23 ENCOUNTER — Other Ambulatory Visit: Payer: Self-pay

## 2019-02-23 DIAGNOSIS — Z955 Presence of coronary angioplasty implant and graft: Secondary | ICD-10-CM

## 2019-02-23 DIAGNOSIS — M0589 Other rheumatoid arthritis with rheumatoid factor of multiple sites: Secondary | ICD-10-CM | POA: Diagnosis not present

## 2019-02-23 MED ORDER — HYDROCHLOROTHIAZIDE 12.5 MG PO CAPS: 12.5000 mg | ORAL_CAPSULE | ORAL | 3 refills | Status: DC

## 2019-02-23 NOTE — Telephone Encounter (Signed)
-----   Message from Belva Crome, MD sent at 02/23/2019 11:38 AM EST ----- Regarding: [name] low blood pressure Ask Mrs. Smilowitz to change HCTZ to Monday Wednesday and Friday dosing.  Continue to monitor blood pressure at cardiac rehab

## 2019-02-23 NOTE — Telephone Encounter (Signed)
Spoke with pt and reviewed recommendations.  Pt denies any lightheadedness or dizziness when at Cardiac Rehab.  Reviewed what BPs should be and when appropriate to call.

## 2019-02-25 ENCOUNTER — Encounter (HOSPITAL_COMMUNITY)
Admission: RE | Admit: 2019-02-25 | Discharge: 2019-02-25 | Disposition: A | Discharge status: Home / Self Care | Payer: Medicare Other | Source: Ambulatory Visit | Attending: Interventional Cardiology | Admitting: Interventional Cardiology

## 2019-02-25 ENCOUNTER — Other Ambulatory Visit: Payer: Self-pay

## 2019-02-25 DIAGNOSIS — Z955 Presence of coronary angioplasty implant and graft: Secondary | ICD-10-CM

## 2019-02-25 NOTE — Progress Notes (Signed)
   02/25/19 1540  Exercise Goal Re-Evaluation  Exercise Goals Review Increase Physical Activity;Able to understand and use rate of perceived exertion (RPE) scale;Increase Strength and Stamina;Knowledge and understanding of Target Heart Rate Range (THRR);Understanding of Exercise Prescription  Comments Reviewed home exercise guidelines with patient including endpoints, temperature precautions, target heart rate and rate of perceived exertion. Pt is walking or riding her stationary bike 5 days/week as her mode of home exercise. Pt also has an elliptical machine but hasn't resumed using it yet. Pt voices understanding of instructions given. Pt states that her breathing is much better now than before she started the program. Pt also states that she's able to walk carrying her dog upstairs without having to stop halfway to catch her breath.  Expected Outcomes Continue to progress workloads as tolerated to help improve strength and stamina.

## 2019-02-26 NOTE — Progress Notes (Signed)
Cardiac Individual Treatment Plan  Patient Details  Name: CLARECE DRZEWIECKI MRN: [id number] Date of Birth: Jun 14, 1950 Referring Provider:     CARDIAC REHAB PHASE II ORIENTATION from 01/29/2019 in [place]  Referring Provider  Dr. Tamala Julian      Initial Encounter Date:    CARDIAC REHAB PHASE II ORIENTATION from 01/29/2019 in [facility]  Date  01/29/19      Visit Diagnosis: S/P coronary artery stent placement S/P DES Mid Distal LAD 12/26/18  Patient's Home Medications on Admission:  Current Outpatient Medications:  .  aspirin (ASPIRIN 81) 81 MG EC tablet, Take 81 mg by mouth daily. , Disp: , Rfl:  .  benazepril (LOTENSIN) 20 MG tablet, Take 1 tablet (20 mg total) by mouth daily., Disp: 90 tablet, Rfl: 3 .  brimonidine (ALPHAGAN) 0.2 % ophthalmic solution, Place 1 drop into both eyes 3 (three) times daily. , Disp: , Rfl:  .  Calcium Carb-Cholecalciferol (CALCIUM-VITAMIN D) 600-400 MG-UNIT TABS, Take 1 tablet by mouth daily., Disp: , Rfl:  .  cetirizine (ZYRTEC) 10 MG tablet, Take 10 mg by mouth daily., Disp: , Rfl:  .  clopidogrel (PLAVIX) 75 MG tablet, Take 1 tablet (75 mg total) by mouth daily., Disp: 90 tablet, Rfl: 3 .  dorzolamide (TRUSOPT) 2 % ophthalmic solution, Place 1 drop into both eyes 2 (two) times daily. , Disp: , Rfl:  .  folic acid (FOLVITE) 1 MG tablet, Take 1 mg by mouth daily. , Disp: , Rfl:  .  Golimumab (SIMPONI ARIA IV), Inject 12.5 mLs into the vein every 8 (eight) weeks. , Disp: , Rfl:  .  hydrochlorothiazide (MICROZIDE) 12.5 MG capsule, Take 1 capsule (12.5 mg total) by mouth every Monday, Wednesday, and Friday., Disp: 45 capsule, Rfl: 3 .  methotrexate (RHEUMATREX) 2.5 MG tablet, Take 22.5 mg by mouth once a week. Mondays, Disp: , Rfl:  .  metoprolol succinate (TOPROL-XL) 25 MG 24 hr tablet, Take 1 tablet (25 mg total) by mouth daily., Disp: 90 tablet, Rfl: 3 .  Multiple Vitamins-Minerals (MULTIVITAMIN  WITH MINERALS) tablet, Take 1 tablet by mouth daily., Disp: , Rfl:  .  nitroGLYCERIN (NITROSTAT) 0.4 MG SL tablet, Place 1 tablet (0.4 mg total) under the tongue every 5 (five) minutes as needed for chest pain., Disp: 25 tablet, Rfl: 3 .  Omega-3 Fatty Acids (FISH OIL) 1000 MG CAPS, Take by mouth daily. , Disp: , Rfl:  .  pantoprazole (PROTONIX) 40 MG tablet, Take 1 tablet (40 mg total) by mouth daily., Disp: 90 tablet, Rfl: 3 .  prednisoLONE acetate (PRED FORTE) 1 % ophthalmic suspension, Place 1 drop into both eyes every other day. , Disp: , Rfl:  .  predniSONE (DELTASONE) 5 MG tablet, Take 5 mg by mouth daily., Disp: , Rfl:  .  rosuvastatin (CRESTOR) 40 MG tablet, Take 1 tablet (40 mg total) by mouth daily., Disp: 90 tablet, Rfl: 3  Past Medical History: Past Medical History:  Diagnosis Date  . Allergic rhinitis   . Breast cyst 2007   Right  . Cataract    surgery 07/2017  . Depression 2006   w/ menopause  . Family history of breast cancer   . Family history of colon cancer   . Family history of kidney cancer 01/15/2018  . Family history of lung cancer   . Family history of pancreatic cancer   . Family history of prostate cancer   . Family history of uterine  cancer   . GERD (gastroesophageal reflux disease)   . Glaucoma   . History of hysterectomy, supracervical   . Hyperlipidemia   . Hypertension   . 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   MLH1 [id number] (Splice site)  . Multinodular goiter   . Tobacco user     Tobacco Use: Social History   Tobacco Use  Smoking Status Former Smoker  . Packs/day: 1.50  . Years: 39.00  . Pack years: 58.50  . Types: Cigarettes  . Quit date: 08/01/2017  . Years since quitting: 1.5  Smokeless Tobacco Never Used  Tobacco Comment   using nicoderm patches    Labs: Recent Review Flowsheet Data    Labs for ITP Cardiac and Pulmonary Rehab Latest Ref Rng & Units 09/23/2017 10/20/2018   Cholestrol 100 - 199 mg/dL - 190   LDLCALC 0 - 99 mg/dL  - 91   HDL >39 mg/dL - 78   Trlycerides 0 - 149 mg/dL - 103   TCO2 22 - 32 mmol/L 27 -      Capillary Blood Glucose: No results found for: GLUCAP   Exercise Target Goals: Exercise Program Goal: Individual exercise prescription set using results from initial 6 min walk test and THRR while considering  patient's activity barriers and safety.   Exercise Prescription Goal: Starting with aerobic activity 30 plus minutes a day, 3 days per week for initial exercise prescription. Provide home exercise prescription and guidelines that participant acknowledges understanding prior to discharge.  Activity Barriers & Risk Stratification: Activity Barriers & Cardiac Risk Stratification - 01/29/19 1456      Activity Barriers & Cardiac Risk Stratification   Activity Barriers  Muscular Weakness;Deconditioning    Cardiac Risk Stratification  Moderate       6 Minute Walk: 6 Minute Walk    Row Name 01/29/19 1455         6 Minute Walk   Phase  Initial     Distance  1364 feet     Walk Time  6 minutes     # of Rest Breaks  0     MPH  2.6     METS  2.58     RPE  12     Perceived Dyspnea   0     VO2 Peak  9.03     Symptoms  No     Resting HR  73 bpm     Resting BP  104/60     Resting Oxygen Saturation   98 %     Exercise Oxygen Saturation  during 6 min walk  99 %     Max Ex. HR  96 bpm     Max Ex. BP  122/72     2 Minute Post BP  114/70        Oxygen Initial Assessment:   Oxygen Re-Evaluation:   Oxygen Discharge (Final Oxygen Re-Evaluation):   Initial Exercise Prescription: Initial Exercise Prescription - 01/29/19 1500      Date of Initial Exercise RX and Referring Provider   Date  01/29/19    Referring Provider  Dr. Tamala Julian    Expected Discharge Date  03/27/19      Recumbant Bike   Level  2    Watts  20    Minutes  15    METs  2.6      NuStep   Level  3    SPM  85    Minutes  15    METs  2.5  Prescription Details   Frequency (times per week)  3     Duration  Progress to 30 minutes of continuous aerobic without signs/symptoms of physical distress      Intensity   THRR 40-80% of Max Heartrate  61-122    Ratings of Perceived Exertion  11-13      Progression   Progression  Continue to progress workloads to maintain intensity without signs/symptoms of physical distress.      Resistance Training   Training Prescription  Yes    Weight  3 lbs.     Reps  10-15       Perform Capillary Blood Glucose checks as needed.  Exercise Prescription Changes: Exercise Prescription Changes    Row Name 02/09/19 1519 02/23/19 1518           Response to Exercise   Blood Pressure (Admit)  112/60  118/66      Blood Pressure (Exercise)  134/76  134/84      Blood Pressure (Exit)  98/56  128/72      Heart Rate (Admit)  87 bpm  99 bpm      Heart Rate (Exercise)  109 bpm  114 bpm      Heart Rate (Exit)  87 bpm  97 bpm      Rating of Perceived Exertion (Exercise)  13  13      Symptoms  none  none      Comments  Off to a great start with exercise.  -      Duration  Progress to 30 minutes of  aerobic without signs/symptoms of physical distress  Progress to 30 minutes of  aerobic without signs/symptoms of physical distress      Intensity  THRR unchanged  THRR unchanged        Progression   Progression  Continue to progress workloads to maintain intensity without signs/symptoms of physical distress.  Continue to progress workloads to maintain intensity without signs/symptoms of physical distress.      Average METs  2.6  2.6        Resistance Training   Training Prescription  Yes  Yes      Weight  3 lbs.   3 lbs.       Reps  10-15  10-15      Time  10 Minutes  10 Minutes        Interval Training   Interval Training  No  No        Bike   Level  2  2      Minutes  7  15      METs  2.8  2.9        Recumbant Bike   Level  -  -      Watts  -  -      Minutes  -  -      METs  -  -        NuStep   Level  3  4      SPM  85  85      Minutes  23   15      METs  2.3  2.3         Exercise Comments: Exercise Comments    Row Name 02/09/19 1621 02/25/19 1540         Exercise Comments  Patient tolerated first session of exercise well, very eager and no symptoms with exertion.  Reviewed home exercise guidelines, METs, and  goals with patient.         Exercise Goals and Review: Exercise Goals    Row Name 01/29/19 1502             Exercise Goals   Increase Physical Activity  Yes       Intervention  Provide advice, education, support and counseling about physical activity/exercise needs.;Develop an individualized exercise prescription for aerobic and resistive training based on initial evaluation findings, risk stratification, comorbidities and participant's personal goals.       Expected Outcomes  Short Term: Attend rehab on a regular basis to increase amount of physical activity.;Long Term: Add in home exercise to make exercise part of routine and to increase amount of physical activity.;Long Term: Exercising regularly at least 3-5 days a week.       Increase Strength and Stamina  Yes       Intervention  Provide advice, education, support and counseling about physical activity/exercise needs.;Develop an individualized exercise prescription for aerobic and resistive training based on initial evaluation findings, risk stratification, comorbidities and participant's personal goals.       Expected Outcomes  Short Term: Increase workloads from initial exercise prescription for resistance, speed, and METs.;Short Term: Perform resistance training exercises routinely during rehab and add in resistance training at home;Long Term: Improve cardiorespiratory fitness, muscular endurance and strength as measured by increased METs and functional capacity (6MWT)       Able to understand and use rate of perceived exertion (RPE) scale  Yes       Intervention  Provide education and explanation on how to use RPE scale       Expected Outcomes  Short Term:  Able to use RPE daily in rehab to express subjective intensity level;Long Term:  Able to use RPE to guide intensity level when exercising independently       Knowledge and understanding of Target Heart Rate Range (THRR)  Yes       Intervention  Provide education and explanation of THRR including how the numbers were predicted and where they are located for reference       Expected Outcomes  Short Term: Able to state/look up THRR;Long Term: Able to use THRR to govern intensity when exercising independently;Short Term: Able to use daily as guideline for intensity in rehab       Able to check pulse independently  Yes       Intervention  Provide education and demonstration on how to check pulse in carotid and radial arteries.;Review the importance of being able to check your own pulse for safety during independent exercise       Expected Outcomes  Short Term: Able to explain why pulse checking is important during independent exercise;Long Term: Able to check pulse independently and accurately       Understanding of Exercise Prescription  Yes       Intervention  Provide education, explanation, and written materials on patient's individual exercise prescription       Expected Outcomes  Short Term: Able to explain program exercise prescription;Long Term: Able to explain home exercise prescription to exercise independently          Exercise Goals Re-Evaluation : Exercise Goals Re-Evaluation    Row Name 02/09/19 1621 02/25/19 1540           Exercise Goal Re-Evaluation   Exercise Goals Review  Increase Physical Activity;Able to understand and use rate of perceived exertion (RPE) scale  Increase Physical Activity;Able to understand and use rate of  perceived exertion (RPE) scale;Increase Strength and Stamina;Knowledge and understanding of Target Heart Rate Range (THRR);Understanding of Exercise Prescription      Comments  Patient able to understand and use RPE scale appropriately.  Reviewed home exercise  guidelines with patient including endpoints, temperature precautions, target heart rate and rate of perceived exertion. Pt is walking or riding her stationary bike 5 days/week as her mode of home exercise. Pt also has an elliptical machine but hasn't resumed using it yet. Pt voices understanding of instructions given. Pt states that her breathing is much better now than before she started the program. Pt also states that she's able to walk carrying her dog upstairs without having to stop halfway to catch her breath.      Expected Outcomes  Increase workloads as tolerated to help improve cardiorespiratory fitness and achieve personal health and fitness goals.  Continue to progress workloads as tolerated to help improve strength and stamina.          Discharge Exercise Prescription (Final Exercise Prescription Changes): Exercise Prescription Changes - 02/23/19 1518      Response to Exercise   Blood Pressure (Admit)  118/66    Blood Pressure (Exercise)  134/84    Blood Pressure (Exit)  128/72    Heart Rate (Admit)  99 bpm    Heart Rate (Exercise)  114 bpm    Heart Rate (Exit)  97 bpm    Rating of Perceived Exertion (Exercise)  13    Symptoms  none    Duration  Progress to 30 minutes of  aerobic without signs/symptoms of physical distress    Intensity  THRR unchanged      Progression   Progression  Continue to progress workloads to maintain intensity without signs/symptoms of physical distress.    Average METs  2.6      Resistance Training   Training Prescription  Yes    Weight  3 lbs.     Reps  10-15    Time  10 Minutes      Interval Training   Interval Training  No      Bike   Level  2    Minutes  15    METs  2.9      NuStep   Level  4    SPM  85    Minutes  15    METs  2.3       Nutrition:  Target Goals: Understanding of nutrition guidelines, daily intake of sodium <1580m, cholesterol <2011m calories 30% from fat and 7% or less from saturated fats, daily to have 5 or  more servings of fruits and vegetables.  Biometrics: Pre Biometrics - 01/29/19 1503      Pre Biometrics   Height  5' 7.25" (1.708 m)    Weight  105.1 kg    Waist Circumference  46.5 inches    Hip Circumference  49.5 inches    Waist to Hip Ratio  0.94 %    BMI (Calculated)  36.03    Triceps Skinfold  45 mm    % Body Fat  49.4 %    Grip Strength  38 kg    Flexibility  19.75 in    Single Leg Stand  10 seconds        Nutrition Therapy Plan and Nutrition Goals: Nutrition Therapy & Goals - 02/19/19 0828      Nutrition Therapy   Diet  heart Healthy    Drug/Food Interactions  Statins/Certain Fruits  Personal Nutrition Goals   Nutrition Goal  Pt to identify food quantities necessary to achieve weight loss of 6-24 lb at graduation from cardiac rehab.    Personal Goal #2  Pt to identify and limit food sources of saturated fat, trans fat, refined carbohydrates and sodium      Intervention Plan   Intervention  Prescribe, educate and counsel regarding individualized specific dietary modifications aiming towards targeted core components such as weight, hypertension, lipid management, diabetes, heart failure and other comorbidities.;Nutrition handout(s) given to patient.    Expected Outcomes  Short Term Goal: Understand basic principles of dietary content, such as calories, fat, sodium, cholesterol and nutrients.;Short Term Goal: A plan has been developed with personal nutrition goals set during dietitian appointment.;Long Term Goal: Adherence to prescribed nutrition plan.       Nutrition Assessments: Nutrition Assessments - 02/19/19 0831      MEDFICTS Scores   Pre Score  26       Nutrition Goals Re-Evaluation: Nutrition Goals Re-Evaluation    Row Name 02/19/19 0830             Goals   Current Weight  231 lb (104.8 kg)       Nutrition Goal  Pt to identify food quantities necessary to achieve weight loss of 6-24 lb at graduation from cardiac rehab.         Personal Goal  #2 Re-Evaluation   Personal Goal #2  Pt to identify and limit food sources of saturated fat, trans fat, refined carbohydrates and sodium          Nutrition Goals Discharge (Final Nutrition Goals Re-Evaluation): Nutrition Goals Re-Evaluation - 02/19/19 0830      Goals   Current Weight  231 lb (104.8 kg)    Nutrition Goal  Pt to identify food quantities necessary to achieve weight loss of 6-24 lb at graduation from cardiac rehab.      Personal Goal #2 Re-Evaluation   Personal Goal #2  Pt to identify and limit food sources of saturated fat, trans fat, refined carbohydrates and sodium       Psychosocial: Target Goals: Acknowledge presence or absence of significant depression and/or stress, maximize coping skills, provide positive support system. Participant is able to verbalize types and ability to use techniques and skills needed for reducing stress and depression.  Initial Review & Psychosocial Screening: Initial Psych Review & Screening - 01/29/19 1423      Initial Review   Current issues with  Current Stress Concerns    Source of Stress Concerns  Family;Chronic Illness    Comments  Patient has chronic uveitis and her sister passed away on 01/31/2019.      Family Dynamics   Good Support System?  Yes   Lynn has her husband for support   Comments  Shamiyah has a lot of stressors both health related and family related due to the passing of her sister. Patient was offered to meet with the hospital chaplain. Caleen declined at this time.      Barriers   Psychosocial barriers to participate in program  The patient should benefit from training in stress management and relaxation.      Screening Interventions   Interventions  Encouraged to exercise;To provide support and resources with identified psychosocial needs    Expected Outcomes  Long Term Goal: Stressors or current issues are controlled or eliminated.       Quality of Life Scores: Quality of Life - 01/29/19 1512  Quality  of Life   Select  Quality of Life      Quality of Life Scores   Health/Function Pre  19.3 %    Socioeconomic Pre  24.36 %    Psych/Spiritual Pre  21.21 %    Family Pre  18 %    GLOBAL Pre  20.62 %      Scores of 19 and below usually indicate a poorer quality of life in these areas.  A difference of  2-3 points is a clinically meaningful difference.  A difference of 2-3 points in the total score of the Quality of Life Index has been associated with significant improvement in overall quality of life, self-image, physical symptoms, and general health in studies assessing change in quality of life.  PHQ-9: Recent Review Flowsheet Data    Depression screen [facility] 2/9 01/29/2019   Decreased Interest 0   Down, Depressed, Hopeless 1    PHQ - 2 Score 1     Interpretation of Total Score  Total Score Depression Severity:  1-4 = Minimal depression, 5-9 = Mild depression, 10-14 = Moderate depression, 15-19 = Moderately severe depression, 20-27 = Severe depression   Psychosocial Evaluation and Intervention: Psychosocial Evaluation - 02/10/19 0805      Psychosocial Evaluation & Interventions   Interventions  Encouraged to exercise with the program and follow exercise prescription    Comments  Ms. Bubolz recently lost her youngest sister to breast cancer. She also has chronic complications from RA. She maintains a positive attitude and acknolodges a strong support system of family, friends, and healthcare providers. She is very eager to participate in CR for social support. She enjoys hiking as this is part of her healthy stress management and she hopes participation in CR will give her the stamina and strength to return to hiking.    Expected Outcomes  Patient will continue to have a positive outlook and attitude. She will utilize her support system if psychosocial barriers to self care managment arise. She will continue to find healthy ways to manage stress.    Continue Psychosocial Services   Follow  up required by staff       Psychosocial Re-Evaluation: Psychosocial Re-Evaluation    [place] Name 02/24/19 1426             Psychosocial Re-Evaluation   Current issues with  Current Stress Concerns       Comments  Upon cardiac rehab orientation patient admitted to stress secondary to her cardiac health and the recent loss of her sister. Her grief seems to be improving and she is learning through lifestyle modifications how to manage her cardiac health. She has a more positive attitude since admission.       Expected Outcomes  Patient will continue to maintain a positive attitude and outlook. She will utilize her support system as needed for encouragement and support. She will participate in healthy stress management utilizing her love of the outdoors and hiking.       Interventions  Encouraged to attend Cardiac Rehabilitation for the exercise       Continue Psychosocial Services   Follow up required by staff          Psychosocial Discharge (Final Psychosocial Re-Evaluation): Psychosocial Re-Evaluation - 02/24/19 1426      Psychosocial Re-Evaluation   Current issues with  Current Stress Concerns    Comments  Upon cardiac rehab orientation patient admitted to stress secondary to her cardiac health and the recent loss of her sister.  Her grief seems to be improving and she is learning through lifestyle modifications how to manage her cardiac health. She has a more positive attitude since admission.    Expected Outcomes  Patient will continue to maintain a positive attitude and outlook. She will utilize her support system as needed for encouragement and support. She will participate in healthy stress management utilizing her love of the outdoors and hiking.    Interventions  Encouraged to attend Cardiac Rehabilitation for the exercise    Continue Psychosocial Services   Follow up required by staff       Vocational Rehabilitation: Provide vocational rehab assistance to qualifying candidates.    Vocational Rehab Evaluation & Intervention: Vocational Rehab - 01/29/19 1429      Initial Vocational Rehab Evaluation & Intervention   Assessment shows need for Vocational Rehabilitation  No       Education: Education Goals: Education classes will be provided on a weekly basis, covering required topics. Participant will state understanding/return demonstration of topics presented.  Learning Barriers/Preferences: Learning Barriers/Preferences - 01/29/19 1509      Learning Barriers/Preferences   Learning Barriers  Sight    Learning Preferences  Verbal Instruction;Skilled Demonstration;Individual Instruction       Education Topics: Hypertension, Hypertension Reduction -Define heart disease and high blood pressure. Discus how high blood pressure affects the body and ways to reduce high blood pressure.   Exercise and Your Heart -Discuss why it is important to exercise, the FITT principles of exercise, normal and abnormal responses to exercise, and how to exercise safely.   Angina -Discuss definition of angina, causes of angina, treatment of angina, and how to decrease risk of having angina.   Cardiac Medications -Review what the following cardiac medications are used for, how they affect the body, and side effects that may occur when taking the medications.  Medications include Aspirin, Beta blockers, calcium channel blockers, ACE Inhibitors, angiotensin receptor blockers, diuretics, digoxin, and antihyperlipidemics.   Congestive Heart Failure -Discuss the definition of CHF, how to live with CHF, the signs and symptoms of CHF, and how keep track of weight and sodium intake.   Heart Disease and Intimacy -Discus the effect sexual activity has on the heart, how changes occur during intimacy as we age, and safety during sexual activity.   Smoking Cessation / COPD -Discuss different methods to quit smoking, the health benefits of quitting smoking, and the definition of COPD.    Nutrition I: Fats -Discuss the types of cholesterol, what cholesterol does to the heart, and how cholesterol levels can be controlled.   Nutrition II: Labels -Discuss the different components of food labels and how to read food label   Heart Parts/Heart Disease and PAD -Discuss the anatomy of the heart, the pathway of blood circulation through the heart, and these are affected by heart disease.   Stress I: Signs and Symptoms -Discuss the causes of stress, how stress may lead to anxiety and depression, and ways to limit stress.   Stress II: Relaxation -Discuss different types of relaxation techniques to limit stress.   Warning Signs of Stroke / TIA -Discuss definition of a stroke, what the signs and symptoms are of a stroke, and how to identify when someone is having stroke.   Knowledge Questionnaire Score: Knowledge Questionnaire Score - 01/29/19 1458      Knowledge Questionnaire Score   Pre Score  22/24       Core Components/Risk Factors/Patient Goals at Admission: Personal Goals and Risk Factors at Admission -  01/29/19 1601      Core Components/Risk Factors/Patient Goals on Admission    Weight Management  Yes;Obesity;Weight Loss;Weight Maintenance    Intervention  Weight Management: Provide education and appropriate resources to help participant work on and attain dietary goals.;Weight Management/Obesity: Establish reasonable short term and long term weight goals.;Obesity: Provide education and appropriate resources to help participant work on and attain dietary goals.    Expected Outcomes  Short Term: Continue to assess and modify interventions until short term weight is achieved;Long Term: Adherence to nutrition and physical activity/exercise program aimed toward attainment of established weight goal;Weight Loss: Understanding of general recommendations for a balanced deficit meal plan, which promotes 1-2 lb weight loss per week and includes a negative energy balance of  [phone] kcal/d;Understanding recommendations for meals to include 15-35% energy as protein, 25-35% energy from fat, 35-60% energy from carbohydrates, less than 232m of dietary cholesterol, 20-35 gm of total fiber daily;Understanding of distribution of calorie intake throughout the day with the consumption of 4-5 meals/snacks;Weight Gain: Understanding of general recommendations for a high calorie, high protein meal plan that promotes weight gain by distributing calorie intake throughout the day with the consumption for 4-5 meals, snacks, and/or supplements    Hypertension  Yes    Intervention  Provide education on lifestyle modifcations including regular physical activity/exercise, weight management, moderate sodium restriction and increased consumption of fresh fruit, vegetables, and low fat dairy, alcohol moderation, and smoking cessation.;Monitor prescription use compliance.    Expected Outcomes  Short Term: Continued assessment and intervention until BP is < 140/9103mHG in hypertensive participants. < 130/8017mG in hypertensive participants with diabetes, heart failure or chronic kidney disease.;Long Term: Maintenance of blood pressure at goal levels.    Lipids  Yes    Intervention  Provide education and support for participant on nutrition & aerobic/resistive exercise along with prescribed medications to achieve LDL <26m27mDL >40mg16m Expected Outcomes  Short Term: Participant states understanding of desired cholesterol values and is compliant with medications prescribed. Participant is following exercise prescription and nutrition guidelines.;Long Term: Cholesterol controlled with medications as prescribed, with individualized exercise RX and with personalized nutrition plan. Value goals: LDL < 26mg,62m > 40 mg.    Stress  Yes    Intervention  Refer participants experiencing significant psychosocial distress to appropriate mental health specialists for further evaluation and treatment. When  possible, include family members and significant others in education/counseling sessions.    Expected Outcomes  Short Term: Participant demonstrates changes in health-related behavior, relaxation and other stress management skills, ability to obtain effective social support, and compliance with psychotropic medications if prescribed.;Long Term: Emotional wellbeing is indicated by absence of clinically significant psychosocial distress or social isolation.       Core Components/Risk Factors/Patient Goals Review:  Goals and Risk Factor Review    Row Name 02/10/19 0814 02/24/19 1439           Core Components/Risk Factors/Patient Goals Review   Personal Goals Review  Weight Management/Obesity;Lipids;Stress;Hypertension  Weight Management/Obesity;Lipids;Stress;Hypertension      Review  Patient with multiple CAD risk factors. She is very eager to participate in CR to decrease her risk factors and to learn lifestyle modifications.  Patient with multiple CAD risk factors. She is very eager to continue to participate in CR to decrease her risk factors and to learn lifestyle modifications.      Expected Outcomes  Patient will continue to participate in CR.  Patient will continue to participate in CR.  Core Components/Risk Factors/Patient Goals at Discharge (Final Review):  Goals and Risk Factor Review - 02/24/19 1439      Core Components/Risk Factors/Patient Goals Review   Personal Goals Review  Weight Management/Obesity;Lipids;Stress;Hypertension    Review  Patient with multiple CAD risk factors. She is very eager to continue to participate in CR to decrease her risk factors and to learn lifestyle modifications.    Expected Outcomes  Patient will continue to participate in CR.       ITP Comments: ITP Comments    Row Name 01/29/19 1422 02/09/19 1600 02/24/19 1420       ITP Comments  Dr Fransico Him MD, Medical Director  Ms. Ke completed her first cardiac rehab exercise session  today and tolerated well. VSS. Patient denied complaints.  30 day ITP review: Ms. Southers is doing very well in cardiac rehab. She has attended all exercise sessions since admittion. VSS. Denies complaints. She is self motivated and suggest when she needs a workload increase. She is enjoying the socialization of CR which is helping her with the recent loss of her sister.        Comments: see ITP comments

## 2019-02-27 ENCOUNTER — Other Ambulatory Visit: Payer: Self-pay

## 2019-02-27 ENCOUNTER — Encounter (HOSPITAL_COMMUNITY)
Admission: RE | Admit: 2019-02-27 | Discharge: 2019-02-27 | Disposition: A | Discharge status: Home / Self Care | Payer: Medicare Other | Source: Ambulatory Visit | Attending: Interventional Cardiology | Admitting: Interventional Cardiology

## 2019-02-27 DIAGNOSIS — Z955 Presence of coronary angioplasty implant and graft: Secondary | ICD-10-CM | POA: Diagnosis not present

## 2019-03-02 ENCOUNTER — Other Ambulatory Visit: Payer: Self-pay

## 2019-03-02 ENCOUNTER — Encounter (HOSPITAL_COMMUNITY)
Admission: RE | Admit: 2019-03-02 | Discharge: 2019-03-02 | Disposition: A | Discharge status: Home / Self Care | Payer: Medicare Other | Source: Ambulatory Visit | Attending: Interventional Cardiology | Admitting: Interventional Cardiology

## 2019-03-02 DIAGNOSIS — Z955 Presence of coronary angioplasty implant and graft: Secondary | ICD-10-CM

## 2019-03-02 NOTE — Progress Notes (Signed)
Duplicate check in

## 2019-03-04 ENCOUNTER — Encounter (HOSPITAL_COMMUNITY)
Admission: RE | Admit: 2019-03-04 | Discharge: 2019-03-04 | Disposition: A | Discharge status: Home / Self Care | Payer: Medicare Other | Source: Ambulatory Visit | Attending: Interventional Cardiology | Admitting: Interventional Cardiology

## 2019-03-04 ENCOUNTER — Other Ambulatory Visit: Payer: Self-pay

## 2019-03-04 DIAGNOSIS — Z955 Presence of coronary angioplasty implant and graft: Secondary | ICD-10-CM | POA: Diagnosis not present

## 2019-03-06 ENCOUNTER — Other Ambulatory Visit: Payer: Self-pay

## 2019-03-06 ENCOUNTER — Encounter (HOSPITAL_COMMUNITY)
Admission: RE | Admit: 2019-03-06 | Discharge: 2019-03-06 | Disposition: A | Discharge status: Home / Self Care | Payer: Medicare Other | Source: Ambulatory Visit | Attending: Interventional Cardiology | Admitting: Interventional Cardiology

## 2019-03-06 DIAGNOSIS — Z955 Presence of coronary angioplasty implant and graft: Secondary | ICD-10-CM | POA: Diagnosis not present

## 2019-03-09 ENCOUNTER — Other Ambulatory Visit: Payer: Self-pay

## 2019-03-09 ENCOUNTER — Encounter (HOSPITAL_COMMUNITY)
Admission: RE | Admit: 2019-03-09 | Discharge: 2019-03-09 | Disposition: A | Discharge status: Home / Self Care | Payer: Medicare Other | Source: Ambulatory Visit | Attending: Interventional Cardiology | Admitting: Interventional Cardiology

## 2019-03-09 DIAGNOSIS — Z955 Presence of coronary angioplasty implant and graft: Secondary | ICD-10-CM

## 2019-03-11 ENCOUNTER — Encounter (HOSPITAL_COMMUNITY)
Admission: RE | Admit: 2019-03-11 | Discharge: 2019-03-11 | Disposition: A | Discharge status: Home / Self Care | Payer: Medicare Other | Source: Ambulatory Visit | Attending: Interventional Cardiology | Admitting: Interventional Cardiology

## 2019-03-11 ENCOUNTER — Other Ambulatory Visit: Payer: Self-pay

## 2019-03-11 DIAGNOSIS — Z955 Presence of coronary angioplasty implant and graft: Secondary | ICD-10-CM

## 2019-03-11 NOTE — Progress Notes (Signed)
         Confirm Consent - In the setting of the current Covid19 crisis, you are scheduled for a phone visit with your Cardiac or Pulmonary team member.  Just as we do with many in-gym visits, in order for you to participate in this visit, we must obtain consent.  If you'd like, I can send this to your mychart (if signed up) or email for you to review.  Otherwise, I can obtain your verbal consent now.  By agreeing to a telephone visit, we'd like you to understand that the technology does not allow for your Cardiac or Pulmonary Rehab team member to perform a physical assessment, and thus may limit their ability to fully assess your ability to perform exercise programs. If your provider identifies any concerns that need to be evaluated in person, we will make arrangements to do so.  Finally, though the technology is pretty good, we cannot assure that it will always work on either your or our end and we cannot ensure that we have a secure connection.  Cardiac and Pulmonary Rehab Telehealth visits and "At Home" cardiac and pulmonary rehab are provided at no cost to you.        Are you willing to proceed?"        STAFF: Did the patient verbally acknowledge consent to telehealth visit? Document YES/NO here: Yes     Rhonda Linan E. Laray Anger, BSN  Cardiac and Pulmonary Rehab Staff        Date 03/11/19 @ Time [phone]

## 2019-03-11 NOTE — Progress Notes (Signed)
Spoke to pt regarding Virtual Cardiac  and Pulmonary Rehab.  Pt  was able to download the [employer] app on their smart device with no issues. Pt set up their account and received the following welcome message -"Welcome to the [facility] and [facility] program. We hope that you will find the exercise program beneficial in your recovery process. Our staff is available to assist with any questions/concerns about your exercise routine. Best wishes". Brief orientation provided to with the advisement to watch the "Intro to Rehab" series located under the Resource tab. Pt verbalized understanding. Will continue to follow and monitor pt progress with feedback as needed. Izzie Geers E. Laray Anger, BSN

## 2019-03-16 ENCOUNTER — Encounter (HOSPITAL_COMMUNITY)
Admission: RE | Admit: 2019-03-16 | Discharge: 2019-03-16 | Disposition: A | Discharge status: Home / Self Care | Payer: Medicare Other | Source: Ambulatory Visit | Attending: Interventional Cardiology | Admitting: Interventional Cardiology

## 2019-03-16 ENCOUNTER — Other Ambulatory Visit: Payer: Self-pay

## 2019-03-16 VITALS — Ht 67.27 in | Wt 223.5 lb

## 2019-03-16 DIAGNOSIS — Z955 Presence of coronary angioplasty implant and graft: Secondary | ICD-10-CM | POA: Diagnosis not present

## 2019-03-18 ENCOUNTER — Other Ambulatory Visit: Payer: Self-pay

## 2019-03-18 ENCOUNTER — Encounter (HOSPITAL_COMMUNITY)
Admission: RE | Admit: 2019-03-18 | Discharge: 2019-03-18 | Disposition: A | Discharge status: Home / Self Care | Payer: Medicare Other | Source: Ambulatory Visit | Attending: Interventional Cardiology | Admitting: Interventional Cardiology

## 2019-03-18 DIAGNOSIS — Z955 Presence of coronary angioplasty implant and graft: Secondary | ICD-10-CM

## 2019-03-23 ENCOUNTER — Encounter (HOSPITAL_COMMUNITY): Payer: Medicare Other

## 2019-03-23 NOTE — Progress Notes (Signed)
Cardiac Individual Treatment Plan  Patient Details  Name: Pamela Lowe MRN: [id number] Date of Birth: [birth year] Referring Provider:     CARDIAC REHAB PHASE II ORIENTATION from 01/29/2019 in [place]  Referring Provider  Dr. Tamala Julian      Initial Encounter Date:    CARDIAC REHAB PHASE II ORIENTATION from 01/29/2019 in [place]  Date  01/29/19      Visit Diagnosis: S/P coronary artery stent placement S/P DES Mid Distal LAD 12/26/18  Patient's Home Medications on Admission:  Current Outpatient Medications:  .  aspirin (ASPIRIN 81) 81 MG EC tablet, Take 81 mg by mouth daily. , Disp: , Rfl:  .  benazepril (LOTENSIN) 20 MG tablet, Take 1 tablet (20 mg total) by mouth daily., Disp: 90 tablet, Rfl: 3 .  brimonidine (ALPHAGAN) 0.2 % ophthalmic solution, Place 1 drop into both eyes 3 (three) times daily. , Disp: , Rfl:  .  Calcium Carb-Cholecalciferol (CALCIUM-VITAMIN D) 600-400 MG-UNIT TABS, Take 1 tablet by mouth daily., Disp: , Rfl:  .  cetirizine (ZYRTEC) 10 MG tablet, Take 10 mg by mouth daily., Disp: , Rfl:  .  clopidogrel (PLAVIX) 75 MG tablet, Take 1 tablet (75 mg total) by mouth daily., Disp: 90 tablet, Rfl: 3 .  dorzolamide (TRUSOPT) 2 % ophthalmic solution, Place 1 drop into both eyes 2 (two) times daily. , Disp: , Rfl:  .  folic acid (FOLVITE) 1 MG tablet, Take 1 mg by mouth daily. , Disp: , Rfl:  .  Golimumab (SIMPONI ARIA IV), Inject 12.5 mLs into the vein every 8 (eight) weeks. , Disp: , Rfl:  .  hydrochlorothiazide (MICROZIDE) 12.5 MG capsule, Take 1 capsule (12.5 mg total) by mouth every Monday, Wednesday, and Friday., Disp: 45 capsule, Rfl: 3 .  methotrexate (RHEUMATREX) 2.5 MG tablet, Take 22.5 mg by mouth once a week. Mondays, Disp: , Rfl:  .  metoprolol succinate (TOPROL-XL) 25 MG 24 hr tablet, Take 1 tablet (25 mg total) by mouth daily., Disp: 90 tablet, Rfl: 3 .  Multiple Vitamins-Minerals (MULTIVITAMIN  WITH MINERALS) tablet, Take 1 tablet by mouth daily., Disp: , Rfl:  .  nitroGLYCERIN (NITROSTAT) 0.4 MG SL tablet, Place 1 tablet (0.4 mg total) under the tongue every 5 (five) minutes as needed for chest pain., Disp: 25 tablet, Rfl: 3 .  Omega-3 Fatty Acids (FISH OIL) 1000 MG CAPS, Take by mouth daily. , Disp: , Rfl:  .  pantoprazole (PROTONIX) 40 MG tablet, Take 1 tablet (40 mg total) by mouth daily., Disp: 90 tablet, Rfl: 3 .  prednisoLONE acetate (PRED FORTE) 1 % ophthalmic suspension, Place 1 drop into both eyes every other day. , Disp: , Rfl:  .  predniSONE (DELTASONE) 5 MG tablet, Take 5 mg by mouth daily., Disp: , Rfl:  .  rosuvastatin (CRESTOR) 40 MG tablet, Take 1 tablet (40 mg total) by mouth daily., Disp: 90 tablet, Rfl: 3  Past Medical History: Past Medical History:  Diagnosis Date  . Allergic rhinitis   . Breast cyst 2007   Right  . Cataract    surgery 07/2017  . Depression 2006   w/ menopause  . Family history of breast cancer   . Family history of colon cancer   . Family history of kidney cancer 01/15/2018  . Family history of lung cancer   . Family history of pancreatic cancer   . Family history of prostate cancer   . Family history of uterine  cancer   . GERD (gastroesophageal reflux disease)   . Glaucoma   . History of hysterectomy, supracervical   . Hyperlipidemia   . Hypertension   . 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   MLH1 [id number] (Splice site)  . Multinodular goiter   . Tobacco user     Tobacco Use: Social History   Tobacco Use  Smoking Status Former Smoker  . Packs/day: 1.50  . Years: 39.00  . Pack years: 58.50  . Types: Cigarettes  . Quit date: 08/01/2017  . Years since quitting: 1.6  Smokeless Tobacco Never Used  Tobacco Comment   using nicoderm patches    Labs: Recent Review Flowsheet Data    Labs for ITP Cardiac and Pulmonary Rehab Latest Ref Rng & Units 09/23/2017 10/20/2018   Cholestrol 100 - 199 mg/dL - 190   LDLCALC 0 - 99 mg/dL  - 91   HDL >39 mg/dL - 78   Trlycerides 0 - 149 mg/dL - 103   TCO2 22 - 32 mmol/L 27 -      Capillary Blood Glucose: No results found for: GLUCAP   Exercise Target Goals: Exercise Program Goal: Individual exercise prescription set using results from initial 6 min walk test and THRR while considering  patient's activity barriers and safety.   Exercise Prescription Goal: Initial exercise prescription builds to 30-45 minutes a day of aerobic activity, 2-3 days per week.  Home exercise guidelines will be given to patient during program as part of exercise prescription that the participant will acknowledge.  Activity Barriers & Risk Stratification: Activity Barriers & Cardiac Risk Stratification - 01/29/19 1456      Activity Barriers & Cardiac Risk Stratification   Activity Barriers  Muscular Weakness;Deconditioning    Cardiac Risk Stratification  Moderate       6 Minute Walk: 6 Minute Walk    Row Name 01/29/19 1455         6 Minute Walk   Phase  Initial     Distance  1364 feet     Walk Time  6 minutes     # of Rest Breaks  0     MPH  2.6     METS  2.58     RPE  12     Perceived Dyspnea   0     VO2 Peak  9.03     Symptoms  No     Resting HR  73 bpm     Resting BP  104/60     Resting Oxygen Saturation   98 %     Exercise Oxygen Saturation  during 6 min walk  99 %     Max Ex. HR  96 bpm     Max Ex. BP  122/72     2 Minute Post BP  114/70        Oxygen Initial Assessment:   Oxygen Re-Evaluation:   Oxygen Discharge (Final Oxygen Re-Evaluation):   Initial Exercise Prescription: Initial Exercise Prescription - 01/29/19 1500      Date of Initial Exercise RX and Referring Provider   Date  01/29/19    Referring Provider  Dr. Tamala Julian    Expected Discharge Date  03/27/19      Recumbant Bike   Level  2    Watts  20    Minutes  15    METs  2.6      NuStep   Level  3    SPM  85    Minutes  15  METs  2.5      Prescription Details   Frequency (times per  week)  3    Duration  Progress to 30 minutes of continuous aerobic without signs/symptoms of physical distress      Intensity   THRR 40-80% of Max Heartrate  61-122    Ratings of Perceived Exertion  11-13      Progression   Progression  Continue to progress workloads to maintain intensity without signs/symptoms of physical distress.      Resistance Training   Training Prescription  Yes    Weight  3 lbs.     Reps  10-15       Perform Capillary Blood Glucose checks as needed.  Exercise Prescription Changes:  Exercise Prescription Changes    Row Name 02/09/19 1519 02/23/19 1518 03/09/19 1522 03/16/19 1521 03/18/19 1524     Response to Exercise   Blood Pressure (Admit)  112/60  118/66  110/60  116/70  118/72   Blood Pressure (Exercise)  134/76  134/84  124/80  120/70  142/82   Blood Pressure (Exit)  98/56  128/72  108/60  110/72  112/78   Heart Rate (Admit)  87 bpm  99 bpm  67 bpm  79 bpm  75 bpm   Heart Rate (Exercise)  109 bpm  114 bpm  102 bpm  109 bpm  108 bpm   Heart Rate (Exit)  87 bpm  97 bpm  67 bpm  69 bpm  81 bpm   Rating of Perceived Exertion (Exercise)  13  13  13  12  13    Symptoms  none  none  none  none  none   Comments  Off to a great start with exercise.  --  --  --  --   Duration  Progress to 30 minutes of  aerobic without signs/symptoms of physical distress  Progress to 30 minutes of  aerobic without signs/symptoms of physical distress  Progress to 30 minutes of  aerobic without signs/symptoms of physical distress  Progress to 30 minutes of  aerobic without signs/symptoms of physical distress  Progress to 30 minutes of  aerobic without signs/symptoms of physical distress   Intensity  THRR unchanged  THRR unchanged  THRR unchanged  THRR unchanged  THRR unchanged     Progression   Progression  Continue to progress workloads to maintain intensity without signs/symptoms of physical distress.  Continue to progress workloads to maintain intensity without signs/symptoms  of physical distress.  Continue to progress workloads to maintain intensity without signs/symptoms of physical distress.  Continue to progress workloads to maintain intensity without signs/symptoms of physical distress.  Continue to progress workloads to maintain intensity without signs/symptoms of physical distress.   Average METs  2.6  2.6  3.4  3.3  3.6     Resistance Training   Training Prescription  Yes  Yes  Yes  Yes  Yes Relaxation day, no weights   Weight  3 lbs.   3 lbs.   3 lbs.   3 lbs.   --   Reps  10-15  10-15  10-15  10-15  --   Time  10 Minutes  10 Minutes  10 Minutes  10 Minutes  --     Interval Training   Interval Training  No  No  No  No  No     Bike   Level  2  2  3  3   3.5   Minutes  7  15  15  15  15    METs  2.8  2.9  4  4   4.4     Recumbant Bike   Level  --  --  --  --  --   Watts  --  --  --  --  --   Minutes  --  --  --  --  --   METs  --  --  --  --  --     NuStep   Level  3  4  4  4  5    SPM  85  85  85  85  85   Minutes  23  15  15  15  15    METs  2.3  2.3  2.8  2.6  2.7     Home Exercise Plan   Plans to continue exercise at  --  --  Home (comment) Walking, stationary bike  Home (comment) Walking, stationary bike  Home (comment) Walking, stationary bike   Frequency  --  --  Add 4 additional days to program exercise sessions.  Add 4 additional days to program exercise sessions.  Add 4 additional days to program exercise sessions.   Initial Home Exercises Provided  --  --  02/25/19  02/25/19  02/25/19      Exercise Comments:  Exercise Comments    Row Name 02/09/19 1621 02/25/19 1540 03/18/19 1550       Exercise Comments  Patient tolerated first session of exercise well, very eager and no symptoms with exertion.  Reviewed home exercise guidelines, METs, and goals with patient.  Reviewed METs and goals with patient.        Exercise Goals and Review:  Exercise Goals    Row Name 01/29/19 1502             Exercise Goals   Increase Physical  Activity  Yes       Intervention  Provide advice, education, support and counseling about physical activity/exercise needs.;Develop an individualized exercise prescription for aerobic and resistive training based on initial evaluation findings, risk stratification, comorbidities and participant's personal goals.       Expected Outcomes  Short Term: Attend rehab on a regular basis to increase amount of physical activity.;Long Term: Add in home exercise to make exercise part of routine and to increase amount of physical activity.;Long Term: Exercising regularly at least 3-5 days a week.       Increase Strength and Stamina  Yes       Intervention  Provide advice, education, support and counseling about physical activity/exercise needs.;Develop an individualized exercise prescription for aerobic and resistive training based on initial evaluation findings, risk stratification, comorbidities and participant's personal goals.       Expected Outcomes  Short Term: Increase workloads from initial exercise prescription for resistance, speed, and METs.;Short Term: Perform resistance training exercises routinely during rehab and add in resistance training at home;Long Term: Improve cardiorespiratory fitness, muscular endurance and strength as measured by increased METs and functional capacity (6MWT)       Able to understand and use rate of perceived exertion (RPE) scale  Yes       Intervention  Provide education and explanation on how to use RPE scale       Expected Outcomes  Short Term: Able to use RPE daily in rehab to express subjective intensity level;Long Term:  Able to use RPE to guide intensity level when exercising independently       Knowledge and understanding of  Target Heart Rate Range (THRR)  Yes       Intervention  Provide education and explanation of THRR including how the numbers were predicted and where they are located for reference       Expected Outcomes  Short Term: Able to state/look up THRR;Long  Term: Able to use THRR to govern intensity when exercising independently;Short Term: Able to use daily as guideline for intensity in rehab       Able to check pulse independently  Yes       Intervention  Provide education and demonstration on how to check pulse in carotid and radial arteries.;Review the importance of being able to check your own pulse for safety during independent exercise       Expected Outcomes  Short Term: Able to explain why pulse checking is important during independent exercise;Long Term: Able to check pulse independently and accurately       Understanding of Exercise Prescription  Yes       Intervention  Provide education, explanation, and written materials on patient's individual exercise prescription       Expected Outcomes  Short Term: Able to explain program exercise prescription;Long Term: Able to explain home exercise prescription to exercise independently          Exercise Goals Re-Evaluation : Exercise Goals Re-Evaluation    Row Name 02/09/19 1621 02/25/19 1540 03/18/19 1550         Exercise Goal Re-Evaluation   Exercise Goals Review  Increase Physical Activity;Able to understand and use rate of perceived exertion (RPE) scale  Increase Physical Activity;Able to understand and use rate of perceived exertion (RPE) scale;Increase Strength and Stamina;Knowledge and understanding of Target Heart Rate Range (THRR);Understanding of Exercise Prescription  Increase Physical Activity;Able to understand and use rate of perceived exertion (RPE) scale;Increase Strength and Stamina;Knowledge and understanding of Target Heart Rate Range (THRR);Understanding of Exercise Prescription     Comments  Patient able to understand and use RPE scale appropriately.  Reviewed home exercise guidelines with patient including endpoints, temperature precautions, target heart rate and rate of perceived exertion. Pt is walking or riding her stationary bike 5 days/week as her mode of home exercise.  Pt also has an elliptical machine but hasn't resumed using it yet. Pt voices understanding of instructions given. Pt states that her breathing is much better now than before she started the program. Pt also states that she's able to walk carrying her dog upstairs without having to stop halfway to catch her breath.  Patient is riding her stationary bike at home, and is progressing well with exercise. Patient states that cardaic reahb has helped her, and she sees improvement since starting the program. Pt also states that she is less short of breath now. Pt would like to resume exercise in the onsite cardiac rehab program once the program resumes.     Expected Outcomes  Increase workloads as tolerated to help improve cardiorespiratory fitness and achieve personal health and fitness goals.  Continue to progress workloads as tolerated to help improve strength and stamina.  Patient will continue exercise ridign her staitonary bike to maintain health and fitness gains.        Discharge Exercise Prescription (Final Exercise Prescription Changes): Exercise Prescription Changes - 03/18/19 1524      Response to Exercise   Blood Pressure (Admit)  118/72    Blood Pressure (Exercise)  142/82    Blood Pressure (Exit)  112/78    Heart Rate (Admit)  75 bpm  Heart Rate (Exercise)  108 bpm    Heart Rate (Exit)  81 bpm    Rating of Perceived Exertion (Exercise)  13    Symptoms  none    Duration  Progress to 30 minutes of  aerobic without signs/symptoms of physical distress    Intensity  THRR unchanged      Progression   Progression  Continue to progress workloads to maintain intensity without signs/symptoms of physical distress.    Average METs  3.6      Resistance Training   Training Prescription  Yes   Relaxation day, no weights     Interval Training   Interval Training  No      Bike   Level  3.5    Minutes  15    METs  4.4      NuStep   Level  5    SPM  85    Minutes  15    METs  2.7       Home Exercise Plan   Plans to continue exercise at  Home (comment)   Walking, stationary bike   Frequency  Add 4 additional days to program exercise sessions.    Initial Home Exercises Provided  02/25/19       Nutrition:  Target Goals: Understanding of nutrition guidelines, daily intake of sodium <1514m, cholesterol <2042m calories 30% from fat and 7% or less from saturated fats, daily to have 5 or more servings of fruits and vegetables.  Biometrics: Pre Biometrics - 01/29/19 1503      Pre Biometrics   Height  5' 7.25" (1.708 m)    Weight  105.1 kg    Waist Circumference  46.5 inches    Hip Circumference  49.5 inches    Waist to Hip Ratio  0.94 %    BMI (Calculated)  36.03    Triceps Skinfold  45 mm    % Body Fat  49.4 %    Grip Strength  38 kg    Flexibility  19.75 in    Single Leg Stand  10 seconds        Nutrition Therapy Plan and Nutrition Goals: Nutrition Therapy & Goals - 02/19/19 0828      Nutrition Therapy   Diet  heart Healthy    Drug/Food Interactions  Statins/Certain Fruits      Personal Nutrition Goals   Nutrition Goal  Pt to identify food quantities necessary to achieve weight loss of 6-24 lb at graduation from cardiac rehab.    Personal Goal #2  Pt to identify and limit food sources of saturated fat, trans fat, refined carbohydrates and sodium      Intervention Plan   Intervention  Prescribe, educate and counsel regarding individualized specific dietary modifications aiming towards targeted core components such as weight, hypertension, lipid management, diabetes, heart failure and other comorbidities.;Nutrition handout(s) given to patient.    Expected Outcomes  Short Term Goal: Understand basic principles of dietary content, such as calories, fat, sodium, cholesterol and nutrients.;Short Term Goal: A plan has been developed with personal nutrition goals set during dietitian appointment.;Long Term Goal: Adherence to prescribed nutrition plan.        Nutrition Assessments: Nutrition Assessments - 02/19/19 0831      MEDFICTS Scores   Pre Score  26       Nutrition Goals Re-Evaluation: Nutrition Goals Re-Evaluation    [place] 02/19/19 0830 03/17/19 1530           Goals  Current Weight  231 lb (104.8 kg)  223 lb 8.7 oz (101.4 kg)      Nutrition Goal  Pt to identify food quantities necessary to achieve weight loss of 6-24 lb at graduation from cardiac rehab.  Pt to identify food quantities necessary to achieve weight loss of 6-24 lb at graduation from cardiac rehab.      Expected Outcome  --  Ongoing- Pt has lost 8 lbs since start of program        Personal Goal #2 Re-Evaluation   Personal Goal #2  Pt to identify and limit food sources of saturated fat, trans fat, refined carbohydrates and sodium  Pt to identify and limit food sources of saturated fat, trans fat, refined carbohydrates and sodium         Nutrition Goals Re-Evaluation: Nutrition Goals Re-Evaluation    [place] Name 02/19/19 0830 03/17/19 1530           Goals   Current Weight  231 lb (104.8 kg)  223 lb 8.7 oz (101.4 kg)      Nutrition Goal  Pt to identify food quantities necessary to achieve weight loss of 6-24 lb at graduation from cardiac rehab.  Pt to identify food quantities necessary to achieve weight loss of 6-24 lb at graduation from cardiac rehab.      Expected Outcome  --  Ongoing- Pt has lost 8 lbs since start of program        Personal Goal #2 Re-Evaluation   Personal Goal #2  Pt to identify and limit food sources of saturated fat, trans fat, refined carbohydrates and sodium  Pt to identify and limit food sources of saturated fat, trans fat, refined carbohydrates and sodium         Nutrition Goals Discharge (Final Nutrition Goals Re-Evaluation): Nutrition Goals Re-Evaluation - 03/17/19 1530      Goals   Current Weight  223 lb 8.7 oz (101.4 kg)    Nutrition Goal  Pt to identify food quantities necessary to achieve weight loss of 6-24 lb at  graduation from cardiac rehab.    Expected Outcome  Ongoing- Pt has lost 8 lbs since start of program      Personal Goal #2 Re-Evaluation   Personal Goal #2  Pt to identify and limit food sources of saturated fat, trans fat, refined carbohydrates and sodium       Psychosocial: Target Goals: Acknowledge presence or absence of significant depression and/or stress, maximize coping skills, provide positive support system. Participant is able to verbalize types and ability to use techniques and skills needed for reducing stress and depression.  Initial Review & Psychosocial Screening: Initial Psych Review & Screening - 01/29/19 1423      Initial Review   Current issues with  Current Stress Concerns    Source of Stress Concerns  Family;Chronic Illness    Comments  Patient has chronic uveitis and her sister passed away on 01/29/2019.      Family Dynamics   Good Support System?  Yes   Pamela Lowe has her husband for support   Comments  Pamela Lowe has a lot of stressors both health related and family related due to the passing of her sister. Patient was offered to meet with the hospital chaplain. Pamela Lowe declined at this time.      Barriers   Psychosocial barriers to participate in program  The patient should benefit from training in stress management and relaxation.      Screening Interventions   Interventions  Encouraged to exercise;To provide support and resources with identified psychosocial needs    Expected Outcomes  Long Term Goal: Stressors or current issues are controlled or eliminated.       Quality of Life Scores: Quality of Life - 01/29/19 1512      Quality of Life   Select  Quality of Life      Quality of Life Scores   Health/Function Pre  19.3 %    Socioeconomic Pre  24.36 %    Psych/Spiritual Pre  21.21 %    Family Pre  18 %    GLOBAL Pre  20.62 %      Scores of 19 and below usually indicate a poorer quality of life in these areas.  A difference of  2-3 points is a clinically  meaningful difference.  A difference of 2-3 points in the total score of the Quality of Life Index has been associated with significant improvement in overall quality of life, self-image, physical symptoms, and general health in studies assessing change in quality of life.  PHQ-9: Recent Review Flowsheet Data    Depression screen [facility] 2/9 01/29/2019   Decreased Interest 0   Down, Depressed, Hopeless 1    PHQ - 2 Score 1     Interpretation of Total Score  Total Score Depression Severity:  1-4 = Minimal depression, 5-9 = Mild depression, 10-14 = Moderate depression, 15-19 = Moderately severe depression, 20-27 = Severe depression   Psychosocial Evaluation and Intervention: Psychosocial Evaluation - 02/10/19 0805      Psychosocial Evaluation & Interventions   Interventions  Encouraged to exercise with the program and follow exercise prescription    Comments  Pamela Lowe recently lost her youngest sister to breast cancer. She also has chronic complications from RA. She maintains a positive attitude and acknolodges a strong support system of family, friends, and healthcare providers. She is very eager to participate in CR for social support. She enjoys hiking as this is part of her healthy stress management and she hopes participation in CR will give her the stamina and strength to return to hiking.    Expected Outcomes  Patient will continue to have a positive outlook and attitude. She will utilize her support system if psychosocial barriers to self care managment arise. She will continue to find healthy ways to manage stress.    Continue Psychosocial Services   Follow up required by staff       Psychosocial Re-Evaluation: Psychosocial Re-Evaluation    Row Name 02/24/19 1426 03/23/19 0802           Psychosocial Re-Evaluation   Current issues with  Current Stress Concerns  None Identified      Comments  Upon cardiac rehab orientation patient admitted to stress secondary to her cardiac  health and the recent loss of her sister. Her grief seems to be improving and she is learning through lifestyle modifications how to manage her cardiac health. She has a more positive attitude since admission.  Pamela Lowe continues to be saddened by the death of her sister however she now has a positive attitude and outlook regarding her health. No psychosocial interventions needed at this time.      Expected Outcomes  Patient will continue to maintain a positive attitude and outlook. She will utilize her support system as needed for encouragement and support. She will participate in healthy stress management utilizing her love of the outdoors and hiking.  Patient will continue to maintain a positive attitude and  outlook. She will utilize her support system as needed for encouragement and support. She will participate in healthy stress management utilizing her love of the outdoors and hiking.      Interventions  Encouraged to attend Cardiac Rehabilitation for the exercise  Encouraged to attend Cardiac Rehabilitation for the exercise      Continue Psychosocial Services   Follow up required by staff  No Follow up required         Psychosocial Discharge (Final Psychosocial Re-Evaluation): Psychosocial Re-Evaluation - 03/23/19 0802      Psychosocial Re-Evaluation   Current issues with  None Identified    Comments  Pamela Lowe continues to be saddened by the death of her sister however she now has a positive attitude and outlook regarding her health. No psychosocial interventions needed at this time.    Expected Outcomes  Patient will continue to maintain a positive attitude and outlook. She will utilize her support system as needed for encouragement and support. She will participate in healthy stress management utilizing her love of the outdoors and hiking.    Interventions  Encouraged to attend Cardiac Rehabilitation for the exercise    Continue Psychosocial Services   No Follow up required        Vocational Rehabilitation: Provide vocational rehab assistance to qualifying candidates.   Vocational Rehab Evaluation & Intervention: Vocational Rehab - 01/29/19 1429      Initial Vocational Rehab Evaluation & Intervention   Assessment shows need for Vocational Rehabilitation  No       Education: Education Goals: Education classes will be provided on a weekly basis, covering required topics. Participant will state understanding/return demonstration of topics presented.  Learning Barriers/Preferences: Learning Barriers/Preferences - 01/29/19 1509      Learning Barriers/Preferences   Learning Barriers  Sight    Learning Preferences  Verbal Instruction;Skilled Demonstration;Individual Instruction       Education Topics: Count Your Pulse:  -Group instruction provided by verbal instruction, demonstration, patient participation and written materials to support subject.  Instructors address importance of being able to find your pulse and how to count your pulse when at home without a heart monitor.  Patients get hands on experience counting their pulse with staff help and individually.   Heart Attack, Angina, and Risk Factor Modification:  -Group instruction provided by verbal instruction, video, and written materials to support subject.  Instructors address signs and symptoms of angina and heart attacks.    Also discuss risk factors for heart disease and how to make changes to improve heart health risk factors.   Functional Fitness:  -Group instruction provided by verbal instruction, demonstration, patient participation, and written materials to support subject.  Instructors address safety measures for doing things around the house.  Discuss how to get up and down off the floor, how to pick things up properly, how to safely get out of a chair without assistance, and balance training.   Meditation and Mindfulness:  -Group instruction provided by verbal instruction, patient  participation, and written materials to support subject.  Instructor addresses importance of mindfulness and meditation practice to help reduce stress and improve awareness.  Instructor also leads participants through a meditation exercise.    Stretching for Flexibility and Mobility:  -Group instruction provided by verbal instruction, patient participation, and written materials to support subject.  Instructors lead participants through series of stretches that are designed to increase flexibility thus improving mobility.  These stretches are additional exercise for major muscle groups that are typically performed during  regular warm up and cool down.   Hands Only CPR:  -Group verbal, video, and participation provides a basic overview of AHA guidelines for community CPR. Role-play of emergencies allow participants the opportunity to practice calling for help and chest compression technique with discussion of AED use.   Hypertension: -Group verbal and written instruction that provides a basic overview of hypertension including the most recent diagnostic guidelines, risk factor reduction with self-care instructions and medication management.    Nutrition I class: Heart Healthy Eating:  -Group instruction provided by PowerPoint slides, verbal discussion, and written materials to support subject matter. The instructor gives an explanation and review of the Therapeutic Lifestyle Changes diet recommendations, which includes a discussion on lipid goals, dietary fat, sodium, fiber, plant stanol/sterol esters, sugar, and the components of a well-balanced, healthy diet.   Nutrition II class: Lifestyle Skills:  -Group instruction provided by PowerPoint slides, verbal discussion, and written materials to support subject matter. The instructor gives an explanation and review of label reading, grocery shopping for heart health, heart healthy recipe modifications, and ways to make healthier choices when eating  out.   Diabetes Question & Answer:  -Group instruction provided by PowerPoint slides, verbal discussion, and written materials to support subject matter. The instructor gives an explanation and review of diabetes co-morbidities, pre- and post-prandial blood glucose goals, pre-exercise blood glucose goals, signs, symptoms, and treatment of hypoglycemia and hyperglycemia, and foot care basics.   Diabetes Blitz:  -Group instruction provided by PowerPoint slides, verbal discussion, and written materials to support subject matter. The instructor gives an explanation and review of the physiology behind type 1 and type 2 diabetes, diabetes medications and rational behind using different medications, pre- and post-prandial blood glucose recommendations and Hemoglobin A1c goals, diabetes diet, and exercise including blood glucose guidelines for exercising safely.    Portion Distortion:  -Group instruction provided by PowerPoint slides, verbal discussion, written materials, and food models to support subject matter. The instructor gives an explanation of serving size versus portion size, changes in portions sizes over the last 20 years, and what consists of a serving from each food group.   Stress Management:  -Group instruction provided by verbal instruction, video, and written materials to support subject matter.  Instructors review role of stress in heart disease and how to cope with stress positively.     Exercising on Your Own:  -Group instruction provided by verbal instruction, power point, and written materials to support subject.  Instructors discuss benefits of exercise, components of exercise, frequency and intensity of exercise, and end points for exercise.  Also discuss use of nitroglycerin and activating EMS.  Review options of places to exercise outside of rehab.  Review guidelines for sex with heart disease.   Cardiac Drugs I:  -Group instruction provided by verbal instruction and  written materials to support subject.  Instructor reviews cardiac drug classes: antiplatelets, anticoagulants, beta blockers, and statins.  Instructor discusses reasons, side effects, and lifestyle considerations for each drug class.   Cardiac Drugs II:  -Group instruction provided by verbal instruction and written materials to support subject.  Instructor reviews cardiac drug classes: angiotensin converting enzyme inhibitors (ACE-I), angiotensin II receptor blockers (ARBs), nitrates, and calcium channel blockers.  Instructor discusses reasons, side effects, and lifestyle considerations for each drug class.   Anatomy and Physiology of the Circulatory System:  Group verbal and written instruction and models provide basic cardiac anatomy and physiology, with the coronary electrical and arterial systems. Review of: AMI, Angina, Valve  disease, Heart Failure, Peripheral Artery Disease, Cardiac Arrhythmia, Pacemakers, and the ICD.   Other Education:  -Group or individual verbal, written, or video instructions that support the educational goals of the cardiac rehab program.   Holiday Eating Survival Tips:  -Group instruction provided by PowerPoint slides, verbal discussion, and written materials to support subject matter. The instructor gives patients tips, tricks, and techniques to help them not only survive but enjoy the holidays despite the onslaught of food that accompanies the holidays.   Knowledge Questionnaire Score: Knowledge Questionnaire Score - 01/29/19 1458      Knowledge Questionnaire Score   Pre Score  22/24       Core Components/Risk Factors/Patient Goals at Admission: Personal Goals and Risk Factors at Admission - 01/29/19 1601      Core Components/Risk Factors/Patient Goals on Admission    Weight Management  Yes;Obesity;Weight Loss;Weight Maintenance    Intervention  Weight Management: Provide education and appropriate resources to help participant work on and attain dietary  goals.;Weight Management/Obesity: Establish reasonable short term and long term weight goals.;Obesity: Provide education and appropriate resources to help participant work on and attain dietary goals.    Expected Outcomes  Short Term: Continue to assess and modify interventions until short term weight is achieved;Long Term: Adherence to nutrition and physical activity/exercise program aimed toward attainment of established weight goal;Weight Loss: Understanding of general recommendations for a balanced deficit meal plan, which promotes 1-2 lb weight loss per week and includes a negative energy balance of [phone] kcal/d;Understanding recommendations for meals to include 15-35% energy as protein, 25-35% energy from fat, 35-60% energy from carbohydrates, less than 238m of dietary cholesterol, 20-35 gm of total fiber daily;Understanding of distribution of calorie intake throughout the day with the consumption of 4-5 meals/snacks;Weight Gain: Understanding of general recommendations for a high calorie, high protein meal plan that promotes weight gain by distributing calorie intake throughout the day with the consumption for 4-5 meals, snacks, and/or supplements    Hypertension  Yes    Intervention  Provide education on lifestyle modifcations including regular physical activity/exercise, weight management, moderate sodium restriction and increased consumption of fresh fruit, vegetables, and low fat dairy, alcohol moderation, and smoking cessation.;Monitor prescription use compliance.    Expected Outcomes  Short Term: Continued assessment and intervention until BP is < 140/983mHG in hypertensive participants. < 130/8023mG in hypertensive participants with diabetes, heart failure or chronic kidney disease.;Long Term: Maintenance of blood pressure at goal levels.    Lipids  Yes    Intervention  Provide education and support for participant on nutrition & aerobic/resistive exercise along with prescribed medications  to achieve LDL <97m78mDL >40mg42m Expected Outcomes  Short Term: Participant states understanding of desired cholesterol values and is compliant with medications prescribed. Participant is following exercise prescription and nutrition guidelines.;Long Term: Cholesterol controlled with medications as prescribed, with individualized exercise RX and with personalized nutrition plan. Value goals: LDL < 97mg,60m > 40 mg.    Stress  Yes    Intervention  Refer participants experiencing significant psychosocial distress to appropriate mental health specialists for further evaluation and treatment. When possible, include family members and significant others in education/counseling sessions.    Expected Outcomes  Short Term: Participant demonstrates changes in health-related behavior, relaxation and other stress management skills, ability to obtain effective social support, and compliance with psychotropic medications if prescribed.;Long Term: Emotional wellbeing is indicated by absence of clinically significant psychosocial distress or social isolation.  Core Components/Risk Factors/Patient Goals Review:  Goals and Risk Factor Review    Row Name 02/10/19 0814 02/24/19 1439 03/23/19 0805         Core Components/Risk Factors/Patient Goals Review   Personal Goals Review  Weight Management/Obesity;Lipids;Stress;Hypertension  Weight Management/Obesity;Lipids;Stress;Hypertension  Weight Management/Obesity;Lipids;Stress;Hypertension     Review  Patient with multiple CAD risk factors. She is very eager to participate in CR to decrease her risk factors and to learn lifestyle modifications.  Patient with multiple CAD risk factors. She is very eager to continue to participate in CR to decrease her risk factors and to learn lifestyle modifications.  Patient with multiple CAD risk factors. She is very eager to continue exercise and lifestyle modifications to decrease riskfactors.     Expected Outcomes  Patient  will continue to participate in CR.  Patient will continue to participate in CR.  Patient will continue to participate in exercise and lifestyle modifications while in-person CR is suspended secondary to Covid-19 pandemic.        Core Components/Risk Factors/Patient Goals at Discharge (Final Review):  Goals and Risk Factor Review - 03/23/19 0805      Core Components/Risk Factors/Patient Goals Review   Personal Goals Review  Weight Management/Obesity;Lipids;Stress;Hypertension    Review  Patient with multiple CAD risk factors. She is very eager to continue exercise and lifestyle modifications to decrease riskfactors.    Expected Outcomes  Patient will continue to participate in exercise and lifestyle modifications while in-person CR is suspended secondary to Covid-19 pandemic.       ITP Comments: ITP Comments    Row Name 01/29/19 1422 02/09/19 1600 02/24/19 1420 03/23/19 0800     ITP Comments  Dr Fransico Him MD, Medical Director  Pamela Lowe completed her first cardiac rehab exercise session today and tolerated well. VSS. Patient denied complaints.  30 day ITP review: Pamela Lowe is doing very well in cardiac rehab. She has attended all exercise sessions since admittion. VSS. Denies complaints. She is self motivated and suggest when she needs a workload increase. She is enjoying the socialization of CR which is helping her with the recent loss of her sister.  30 day ITP review: Pamela Lowe is doing very well in cardiac rehab. She has attended all exercise sessions since admittion. VSS. Denies complaints. She is self motivated and suggest when she needs a workload increase. She is not interested in participating in the virtual CR app during in-person closure however she request to be contacted once department re-opens to inperson exercise.       Comments: Patient has completed 15 onsite cardiac rehab exercise sessions prior to closure of in-person exercise secondary to Covid 19 pandemic. Patient  declines virtual cardiac rehab or telephone follow up during closure. Patient will be contacted once department reopens to in-person exercise.

## 2019-03-24 ENCOUNTER — Telehealth: Payer: Self-pay | Admitting: Pharmacist

## 2019-03-24 MED ORDER — EZETIMIBE 10 MG PO TABS: 10.0000 mg | ORAL_TABLET | Freq: Every day | ORAL | 3 refills | Status: DC

## 2019-03-24 NOTE — Telephone Encounter (Signed)
Called pt to see if she changed insurance for 2021 year (prior insurance plan did not cover PCSK9i therapy). She stated she is continuing with her current insurance plan due to cost. Since PCSK9i unfortunately won't be an option with her insurance, will have her start ezetimibe instead. 90 day supply is $20 with GoodRx coupon. She will continue on rosuvastatin 40mg  daily as well. She will have her PCP check follow up labs.

## 2019-03-25 ENCOUNTER — Encounter (HOSPITAL_COMMUNITY): Payer: Medicare Other

## 2019-03-27 ENCOUNTER — Encounter (HOSPITAL_COMMUNITY): Payer: Medicare Other

## 2019-03-27 ENCOUNTER — Other Ambulatory Visit: Payer: Self-pay

## 2019-03-27 ENCOUNTER — Encounter (HOSPITAL_COMMUNITY)
Admission: RE | Admit: 2019-03-27 | Discharge: 2019-03-27 | Disposition: A | Discharge status: Home / Self Care | Payer: Self-pay | Source: Ambulatory Visit | Attending: Interventional Cardiology | Admitting: Interventional Cardiology

## 2019-03-27 DIAGNOSIS — Z955 Presence of coronary angioplasty implant and graft: Secondary | ICD-10-CM

## 2019-03-27 NOTE — Progress Notes (Signed)
Cardiac Rehab Note: Successful telephone encounter to Ms. Pamela Lowe to discuss her cardiac plan of care and to assess for any needs or concerns. Pamela Lowe states she feels comfortable utilizing the app. She is finding it somewhat difficult to establish a home routine however she is continuing to exercise daily by riding her stationary bike. She is consistently logging her pre and post exercise vitals. VSS. She would like to establish a consistent routine/time at home for exercise and self care.  Patient denies symptoms with daily exercise.  Plan: Pamela Lowe will continue to utilize the [facility] VCR app. CR RN will follow up next week via text message through app.  Ryland Tungate E. Laray Anger, BSN

## 2019-03-31 DIAGNOSIS — H209 Unspecified iridocyclitis: Secondary | ICD-10-CM | POA: Diagnosis not present

## 2019-03-31 DIAGNOSIS — H401122 Primary open-angle glaucoma, left eye, moderate stage: Secondary | ICD-10-CM | POA: Diagnosis not present

## 2019-03-31 DIAGNOSIS — H401113 Primary open-angle glaucoma, right eye, severe stage: Secondary | ICD-10-CM | POA: Diagnosis not present

## 2019-04-01 DIAGNOSIS — H209 Unspecified iridocyclitis: Secondary | ICD-10-CM | POA: Diagnosis not present

## 2019-04-01 DIAGNOSIS — H4041X3 Glaucoma secondary to eye inflammation, right eye, severe stage: Secondary | ICD-10-CM | POA: Diagnosis not present

## 2019-04-01 DIAGNOSIS — H4042X2 Glaucoma secondary to eye inflammation, left eye, moderate stage: Secondary | ICD-10-CM | POA: Diagnosis not present

## 2019-04-06 DIAGNOSIS — L309 Dermatitis, unspecified: Secondary | ICD-10-CM | POA: Diagnosis not present

## 2019-04-06 DIAGNOSIS — L738 Other specified follicular disorders: Secondary | ICD-10-CM | POA: Diagnosis not present

## 2019-04-06 DIAGNOSIS — L2089 Other atopic dermatitis: Secondary | ICD-10-CM | POA: Diagnosis not present

## 2019-04-06 DIAGNOSIS — R21 Rash and other nonspecific skin eruption: Secondary | ICD-10-CM | POA: Diagnosis not present

## 2019-04-13 ENCOUNTER — Encounter (HOSPITAL_COMMUNITY)
Admission: RE | Admit: 2019-04-13 | Discharge: 2019-04-13 | Disposition: A | Discharge status: Home / Self Care | Payer: Medicare Other | Source: Ambulatory Visit | Attending: Interventional Cardiology | Admitting: Interventional Cardiology

## 2019-04-13 ENCOUNTER — Telehealth (HOSPITAL_COMMUNITY): Payer: Self-pay | Admitting: *Deleted

## 2019-04-13 ENCOUNTER — Other Ambulatory Visit: Payer: Self-pay

## 2019-04-13 DIAGNOSIS — Z955 Presence of coronary angioplasty implant and graft: Secondary | ICD-10-CM | POA: Insufficient documentation

## 2019-04-13 NOTE — Telephone Encounter (Signed)
Spoke with Pamela Lowe as she documented feeling shortness of breath on the [facility] virtual cardiac rehab APP on 04/12/19. Pamela Lowe says that she was riding her bike at home and had increased her work load to a 3. Pamela Lowe says that she realized that she may have worked at too ambitious a goal. Pamela Lowe says she was working to hard a level and that she felt better when she slowed down. Pamela Lowe says that she will gradually increase her work load on the bike for a future goal. Pamela Lowe's vital signs have been stable that she has documented on the APP. Pamela Lowe's blood pressure documented today was 127/27. Pamela Lowe would like to talk with the dietitian for follow up. Will continue to monitor on the virtual APP.Barnet Pall, RN,BSN 04/13/2019 9:59 AM

## 2019-04-16 ENCOUNTER — Ambulatory Visit: Payer: Medicare Other

## 2019-04-22 ENCOUNTER — Telehealth (HOSPITAL_COMMUNITY): Payer: Self-pay

## 2019-04-22 NOTE — Telephone Encounter (Signed)
Pt insurance is active and benefits verified through Medicare A/B Co-pay 0, DED $203/$203 met, out of pocket 0/0 met, co-insurance 20%. no pre-authorization required. Passport, 04/22/2019@3 :59pm, REF# [id number]  Will contact patient to see if she is interested in the Cardiac Rehab Program. If interested, patient will need to complete follow up appt. Once completed, patient will be contacted for scheduling upon review by the RN Navigator.  2ndary insurance is active and benefits verified through [employer]. Co-pay 0, DED 0/0 met, out of pocket 0/0 met, co-insurance 0. No pre-authorization required.

## 2019-04-23 NOTE — Telephone Encounter (Signed)
Called pt and scheduled her 9 remaining cardiac rehab sessions. Pt will come in on 05/04/2019@3 :15pm and graduate on 05/22/2019.

## 2019-04-23 NOTE — Addendum Note (Signed)
Encounter addended by: Sol Passer on: 04/23/2019 9:24 AM  Actions taken: Flowsheet data copied forward, Flowsheet accepted

## 2019-04-24 ENCOUNTER — Ambulatory Visit: Payer: Medicare Other | Attending: Internal Medicine

## 2019-04-24 DIAGNOSIS — Z23 Encounter for immunization: Secondary | ICD-10-CM | POA: Insufficient documentation

## 2019-04-24 NOTE — Progress Notes (Signed)
   Covid-19 Vaccination Clinic  Name:  MASA BRISKI    MRN: [id number] DOB: [birth year]  04/24/2019  Ms. Lorah was observed post Covid-19 immunization for 15 minutes without incidence. She was provided with Vaccine Information Sheet and instruction to access the V-Safe system.   Ms. Halbig was instructed to call 911 with any severe reactions post vaccine: [name] Difficulty breathing  . Swelling of your face and throat  . A fast heartbeat  . A bad rash all over your body  . Dizziness and weakness    Immunizations Administered    Name Date Dose VIS Date Route   Pfizer COVID-19 Vaccine 04/24/2019 11:07 AM 0.3 mL 02/27/2019 Intramuscular   Manufacturer: [place]   Lot: [id number]   [place]: [id number]

## 2019-04-27 ENCOUNTER — Encounter (HOSPITAL_COMMUNITY): Payer: Self-pay

## 2019-04-27 ENCOUNTER — Encounter (HOSPITAL_COMMUNITY)
Admission: RE | Admit: 2019-04-27 | Discharge: 2019-04-27 | Disposition: A | Discharge status: Home / Self Care | Payer: Medicare Other | Source: Ambulatory Visit | Attending: Interventional Cardiology | Admitting: Interventional Cardiology

## 2019-04-27 ENCOUNTER — Other Ambulatory Visit: Payer: Self-pay

## 2019-04-27 DIAGNOSIS — Z955 Presence of coronary angioplasty implant and graft: Secondary | ICD-10-CM

## 2019-04-27 NOTE — Progress Notes (Signed)
Virtual Cardiac Rehab Note:  Successful telephone encounter to Pamela Lowe to follow up on her current cardiac rehab plan of care and home exercise. [name] continue to better her health while in-person CR is suspended by exercising almost daily. She monitors her vitals pre and post exercise and able to verbalize when she should notify her medical provider with readings outside her parameters. She has been contacted about returning to in-person exercise and will resume her sessions 05/04/19. She plans to continue to utilize the [employer] app to log her exercise on days she does not exercise in person. Commended patient on her continued efforts to better her health and decrease her CAD risk factors.   Plan: Will continue to follow   [name] E. Rollene Rotunda RN, BSN [place]. [facility] Cardiac and Pulmonary Re[facility] [place] Direct: [phone]

## 2019-04-27 NOTE — Progress Notes (Signed)
Cardiac Individual Treatment Plan  Patient Details  Name: Pamela Lowe MRN: [id number] Date of Birth: September 11, 1950 Referring Provider:     CARDIAC REHAB PHASE II ORIENTATION from 01/29/2019 in [facility]  Referring Provider  Dr. Tamala Julian      Initial Encounter Date:    CARDIAC REHAB PHASE II ORIENTATION from 01/29/2019 in [facility]  Date  01/29/19      Visit Diagnosis: S/P coronary artery stent placement S/P DES Mid Distal LAD 12/26/18  Patient's Home Medications on Admission:  Current Outpatient Medications:  .  aspirin (ASPIRIN 81) 81 MG EC tablet, Take 81 mg by mouth daily. , Disp: , Rfl:  .  benazepril (LOTENSIN) 20 MG tablet, Take 1 tablet (20 mg total) by mouth daily., Disp: 90 tablet, Rfl: 3 .  brimonidine (ALPHAGAN) 0.2 % ophthalmic solution, Place 1 drop into both eyes 3 (three) times daily. , Disp: , Rfl:  .  Calcium Carb-Cholecalciferol (CALCIUM-VITAMIN D) 600-400 MG-UNIT TABS, Take 1 tablet by mouth daily., Disp: , Rfl:  .  cetirizine (ZYRTEC) 10 MG tablet, Take 10 mg by mouth daily., Disp: , Rfl:  .  clopidogrel (PLAVIX) 75 MG tablet, Take 1 tablet (75 mg total) by mouth daily., Disp: 90 tablet, Rfl: 3 .  dorzolamide (TRUSOPT) 2 % ophthalmic solution, Place 1 drop into both eyes 2 (two) times daily. , Disp: , Rfl:  .  ezetimibe (ZETIA) 10 MG tablet, Take 1 tablet (10 mg total) by mouth daily., Disp: 90 tablet, Rfl: 3 .  folic acid (FOLVITE) 1 MG tablet, Take 1 mg by mouth daily. , Disp: , Rfl:  .  Golimumab (SIMPONI ARIA IV), Inject 12.5 mLs into the vein every 8 (eight) weeks. , Disp: , Rfl:  .  hydrochlorothiazide (MICROZIDE) 12.5 MG capsule, Take 1 capsule (12.5 mg total) by mouth every Monday, Wednesday, and Friday., Disp: 45 capsule, Rfl: 3 .  methotrexate (RHEUMATREX) 2.5 MG tablet, Take 22.5 mg by mouth once a week. Mondays, Disp: , Rfl:  .  metoprolol succinate (TOPROL-XL) 25 MG 24 hr tablet, Take 1  tablet (25 mg total) by mouth daily., Disp: 90 tablet, Rfl: 3 .  Multiple Vitamins-Minerals (MULTIVITAMIN WITH MINERALS) tablet, Take 1 tablet by mouth daily., Disp: , Rfl:  .  nitroGLYCERIN (NITROSTAT) 0.4 MG SL tablet, Place 1 tablet (0.4 mg total) under the tongue every 5 (five) minutes as needed for chest pain., Disp: 25 tablet, Rfl: 3 .  Omega-3 Fatty Acids (FISH OIL) 1000 MG CAPS, Take by mouth daily. , Disp: , Rfl:  .  pantoprazole (PROTONIX) 40 MG tablet, Take 1 tablet (40 mg total) by mouth daily., Disp: 90 tablet, Rfl: 3 .  prednisoLONE acetate (PRED FORTE) 1 % ophthalmic suspension, Place 1 drop into both eyes every other day. , Disp: , Rfl:  .  predniSONE (DELTASONE) 5 MG tablet, Take 5 mg by mouth daily., Disp: , Rfl:  .  rosuvastatin (CRESTOR) 40 MG tablet, Take 1 tablet (40 mg total) by mouth daily., Disp: 90 tablet, Rfl: 3  Past Medical History: Past Medical History:  Diagnosis Date  . Allergic rhinitis   . Breast cyst 2007   Right  . Cataract    surgery 07/2017  . Depression 2006   w/ menopause  . Family history of breast cancer   . Family history of colon cancer   . Family history of kidney cancer 01/15/2018  . Family history of lung cancer   .  Family history of pancreatic cancer   . Family history of prostate cancer   . Family history of uterine cancer   . GERD (gastroesophageal reflux disease)   . Glaucoma   . History of hysterectomy, supracervical   . Hyperlipidemia   . Hypertension   . 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   MLH1 [id number] (Splice site)  . Multinodular goiter   . Tobacco user     Tobacco Use: Social History   Tobacco Use  Smoking Status Former Smoker  . Packs/day: 1.50  . Years: 39.00  . Pack years: 58.50  . Types: Cigarettes  . Quit date: 08/01/2017  . Years since quitting: 1.7  Smokeless Tobacco Never Used  Tobacco Comment   using nicoderm patches    Labs: Recent Review Flowsheet Data    Labs for ITP Cardiac and Pulmonary  Rehab Latest Ref Rng & Units 09/23/2017 10/20/2018   Cholestrol 100 - 199 mg/dL - 190   LDLCALC 0 - 99 mg/dL - 91   HDL >39 mg/dL - 78   Trlycerides 0 - 149 mg/dL - 103   TCO2 22 - 32 mmol/L 27 -      Capillary Blood Glucose: No results found for: GLUCAP   Exercise Target Goals: Exercise Program Goal: Individual exercise prescription set using results from initial 6 min walk test and THRR while considering  patient's activity barriers and safety.   Exercise Prescription Goal: Initial exercise prescription builds to 30-45 minutes a day of aerobic activity, 2-3 days per week.  Home exercise guidelines will be given to patient during program as part of exercise prescription that the participant will acknowledge.  Activity Barriers & Risk Stratification:   6 Minute Walk:   Oxygen Initial Assessment:   Oxygen Re-Evaluation:   Oxygen Discharge (Final Oxygen Re-Evaluation):   Initial Exercise Prescription:   Perform Capillary Blood Glucose checks as needed.  Exercise Prescription Changes:  Exercise Prescription Changes    Row Name 03/09/19 1522 03/16/19 1521 03/18/19 1524 04/22/19 0921       Response to Exercise   Blood Pressure (Admit)  110/60  116/70  118/72  107/65    Blood Pressure (Exercise)  124/80  120/70  142/82  --    Blood Pressure (Exit)  108/60  110/72  112/78  121/75    Heart Rate (Admit)  67 bpm  79 bpm  75 bpm  64 bpm    Heart Rate (Exercise)  102 bpm  109 bpm  108 bpm  130 bpm    Heart Rate (Exit)  67 bpm  69 bpm  81 bpm  --    Rating of Perceived Exertion (Exercise)  13  12  13  13     Symptoms  none  none  none  none    Comments  --  --  --  Virtual Cardiac Rehab Session    Duration  Progress to 30 minutes of  aerobic without signs/symptoms of physical distress  Progress to 30 minutes of  aerobic without signs/symptoms of physical distress  Progress to 30 minutes of  aerobic without signs/symptoms of physical distress  Continue with 45 min of aerobic  exercise without signs/symptoms of physical distress.    Intensity  THRR unchanged  THRR unchanged  THRR unchanged  THRR unchanged      Progression   Progression  Continue to progress workloads to maintain intensity without signs/symptoms of physical distress.  Continue to progress workloads to maintain intensity without signs/symptoms of physical distress.  Continue to progress  workloads to maintain intensity without signs/symptoms of physical distress.  Continue to progress workloads to maintain intensity without signs/symptoms of physical distress.    Average METs  3.4  3.3  3.6  --      Resistance Training   Training Prescription  Yes  Yes  Yes Relaxation day, no weights  --    Weight  3 lbs.   3 lbs.   --  --    Reps  10-15  10-15  --  --    Time  10 Minutes  10 Minutes  --  --      Interval Training   Interval Training  No  No  No  No      Bike   Level  3  3  3.5  --    Minutes  15  15  15  30     METs  4  4  4.4  --      NuStep   Level  4  4  5   --    SPM  85  85  85  --    Minutes  15  15  15   --    METs  2.8  2.6  2.7  --      Home Exercise Plan   Plans to continue exercise at  Home (comment) Walking, stationary bike  Home (comment) Walking, stationary bike  Home (comment) Walking, stationary bike  Home (comment) Walking, stationary bike    Frequency  Add 4 additional days to program exercise sessions.  Add 4 additional days to program exercise sessions.  Add 4 additional days to program exercise sessions.  Add 4 additional days to program exercise sessions.    Initial Home Exercises Provided  02/25/19  02/25/19  02/25/19  02/25/19       Exercise Comments:  Exercise Comments    Row Name 03/18/19 1550 04/23/19 0923         Exercise Comments  Reviewed METs and goals with patient.  Patient will be contacted to resume participation in the onsite cardiac rehab program after temporary closure due to COVID-19 pandemic.         Exercise Goals and Review:   Exercise  Goals Re-Evaluation : Exercise Goals Re-Evaluation    Row Name 03/18/19 1550 04/23/19 0923           Exercise Goal Re-Evaluation   Exercise Goals Review  Increase Physical Activity;Able to understand and use rate of perceived exertion (RPE) scale;Increase Strength and Stamina;Knowledge and understanding of Target Heart Rate Range (THRR);Understanding of Exercise Prescription  Increase Physical Activity;Able to understand and use rate of perceived exertion (RPE) scale;Increase Strength and Stamina;Knowledge and understanding of Target Heart Rate Range (THRR);Understanding of Exercise Prescription      Comments  Patient is riding her stationary bike at home, and is progressing well with exercise. Patient states that cardaic reahb has helped her, and she sees improvement since starting the program. Pt also states that she is less short of breath now. Pt would like to resume exercise in the onsite cardiac rehab program once the program resumes.  Patient has been actively participating in the virtual cardiac rehab program via the [employer] app and has been progressing well. Patient riding her stationary bike 30-50 minutes, 4-7 days/week.      Expected Outcomes  Patient will continue exercise ridign her staitonary bike to maintain health and fitness gains.  Patient will transition back to the onsite cardiac rehab program  Discharge Exercise Prescription (Final Exercise Prescription Changes): Exercise Prescription Changes - 04/22/19 0921      Response to Exercise   Blood Pressure (Admit)  107/65    Blood Pressure (Exit)  121/75    Heart Rate (Admit)  64 bpm    Heart Rate (Exercise)  130 bpm    Rating of Perceived Exertion (Exercise)  13    Symptoms  none    Comments  Virtual Cardiac Rehab Session    Duration  Continue with 45 min of aerobic exercise without signs/symptoms of physical distress.    Intensity  THRR unchanged      Progression   Progression  Continue to progress workloads  to maintain intensity without signs/symptoms of physical distress.      Interval Training   Interval Training  No      Bike   Minutes  30      Home Exercise Plan   Plans to continue exercise at  Home (comment)   Walking, stationary bike   Frequency  Add 4 additional days to program exercise sessions.    Initial Home Exercises Provided  02/25/19       Nutrition:  Target Goals: Understanding of nutrition guidelines, daily intake of sodium <1539m, cholesterol <2065m calories 30% from fat and 7% or less from saturated fats, daily to have 5 or more servings of fruits and vegetables.  Biometrics:    Nutrition Therapy Plan and Nutrition Goals:   Nutrition Assessments:   Nutrition Goals Re-Evaluation: Nutrition Goals Re-Evaluation    Row Name 03/17/19 1530             Goals   Current Weight  223 lb 8.7 oz (101.4 kg)       Nutrition Goal  Pt to identify food quantities necessary to achieve weight loss of 6-24 lb at graduation from cardiac rehab.       Expected Outcome  Ongoing- Pt has lost 8 lbs since start of program         Personal Goal #2 Re-Evaluation   Personal Goal #2  Pt to identify and limit food sources of saturated fat, trans fat, refined carbohydrates and sodium          Nutrition Goals Re-Evaluation: Nutrition Goals Re-Evaluation    [place] 03/17/19 1530             Goals   Current Weight  223 lb 8.7 oz (101.4 kg)       Nutrition Goal  Pt to identify food quantities necessary to achieve weight loss of 6-24 lb at graduation from cardiac rehab.       Expected Outcome  Ongoing- Pt has lost 8 lbs since start of program         Personal Goal #2 Re-Evaluation   Personal Goal #2  Pt to identify and limit food sources of saturated fat, trans fat, refined carbohydrates and sodium          Nutrition Goals Discharge (Final Nutrition Goals Re-Evaluation): Nutrition Goals Re-Evaluation - 03/17/19 1530      Goals   Current Weight  223 lb 8.7 oz (101.4 kg)     Nutrition Goal  Pt to identify food quantities necessary to achieve weight loss of 6-24 lb at graduation from cardiac rehab.    Expected Outcome  Ongoing- Pt has lost 8 lbs since start of program      Personal Goal #2 Re-Evaluation   Personal Goal #2  Pt to identify and limit food sources of  saturated fat, trans fat, refined carbohydrates and sodium       Psychosocial: Target Goals: Acknowledge presence or absence of significant depression and/or stress, maximize coping skills, provide positive support system. Participant is able to verbalize types and ability to use techniques and skills needed for reducing stress and depression.  Initial Review & Psychosocial Screening:   Quality of Life Scores:  Scores of 19 and below usually indicate a poorer quality of life in these areas.  A difference of  2-3 points is a clinically meaningful difference.  A difference of 2-3 points in the total score of the Quality of Life Index has been associated with significant improvement in overall quality of life, self-image, physical symptoms, and general health in studies assessing change in quality of life.  PHQ-9: Recent Review Flowsheet Data    Depression screen [facility] 2/9 01/29/2019   Decreased Interest 0   Down, Depressed, Hopeless 1    PHQ - 2 Score 1     Interpretation of Total Score  Total Score Depression Severity:  1-4 = Minimal depression, 5-9 = Mild depression, 10-14 = Moderate depression, 15-19 = Moderately severe depression, 20-27 = Severe depression   Psychosocial Evaluation and Intervention:   Psychosocial Re-Evaluation: Psychosocial Re-Evaluation    [place] Name 03/23/19 0802 04/27/19 1138           Psychosocial Re-Evaluation   Current issues with  None Identified  None Identified      Comments  Ms. Schrupp continues to be saddened by the death of her sister however she now has a positive attitude and outlook regarding her health. No psychosocial interventions needed at this  time.  Ms. Berland denies psychosocial barriers to self health managment during recent telephone conversation. She continues to have a positive outlook and attitude.      Expected Outcomes  Patient will continue to maintain a positive attitude and outlook. She will utilize her support system as needed for encouragement and support. She will participate in healthy stress management utilizing her love of the outdoors and hiking.  Patient will continue to maintain a positive attitude and outlook. She will utilize her support system as needed for encouragement and support. She will participate in healthy stress management utilizing her love of the outdoors and hiking.      Interventions  Encouraged to attend Cardiac Rehabilitation for the exercise  --      Continue Psychosocial Services   No Follow up required  --         Psychosocial Discharge (Final Psychosocial Re-Evaluation): Psychosocial Re-Evaluation - 04/27/19 1138      Psychosocial Re-Evaluation   Current issues with  None Identified    Comments  Ms. Croghan denies psychosocial barriers to self health managment during recent telephone conversation. She continues to have a positive outlook and attitude.    Expected Outcomes  Patient will continue to maintain a positive attitude and outlook. She will utilize her support system as needed for encouragement and support. She will participate in healthy stress management utilizing her love of the outdoors and hiking.       Vocational Rehabilitation: Provide vocational rehab assistance to qualifying candidates.   Vocational Rehab Evaluation & Intervention:   Education: Education Goals: Education classes will be provided on a weekly basis, covering required topics. Participant will state understanding/return demonstration of topics presented.  Learning Barriers/Preferences:   Education Topics: Count Your Pulse:  -Group instruction provided by verbal instruction, demonstration, patient  participation and written materials to support subject.  Instructors address importance of being able to find your pulse and how to count your pulse when at home without a heart monitor.  Patients get hands on experience counting their pulse with staff help and individually.   Heart Attack, Angina, and Risk Factor Modification:  -Group instruction provided by verbal instruction, video, and written materials to support subject.  Instructors address signs and symptoms of angina and heart attacks.    Also discuss risk factors for heart disease and how to make changes to improve heart health risk factors.   Functional Fitness:  -Group instruction provided by verbal instruction, demonstration, patient participation, and written materials to support subject.  Instructors address safety measures for doing things around the house.  Discuss how to get up and down off the floor, how to pick things up properly, how to safely get out of a chair without assistance, and balance training.   Meditation and Mindfulness:  -Group instruction provided by verbal instruction, patient participation, and written materials to support subject.  Instructor addresses importance of mindfulness and meditation practice to help reduce stress and improve awareness.  Instructor also leads participants through a meditation exercise.    Stretching for Flexibility and Mobility:  -Group instruction provided by verbal instruction, patient participation, and written materials to support subject.  Instructors lead participants through series of stretches that are designed to increase flexibility thus improving mobility.  These stretches are additional exercise for major muscle groups that are typically performed during regular warm up and cool down.   Hands Only CPR:  -Group verbal, video, and participation provides a basic overview of AHA guidelines for community CPR. Role-play of emergencies allow participants the opportunity to  practice calling for help and chest compression technique with discussion of AED use.   Hypertension: -Group verbal and written instruction that provides a basic overview of hypertension including the most recent diagnostic guidelines, risk factor reduction with self-care instructions and medication management.    Nutrition I class: Heart Healthy Eating:  -Group instruction provided by PowerPoint slides, verbal discussion, and written materials to support subject matter. The instructor gives an explanation and review of the Therapeutic Lifestyle Changes diet recommendations, which includes a discussion on lipid goals, dietary fat, sodium, fiber, plant stanol/sterol esters, sugar, and the components of a well-balanced, healthy diet.   Nutrition II class: Lifestyle Skills:  -Group instruction provided by PowerPoint slides, verbal discussion, and written materials to support subject matter. The instructor gives an explanation and review of label reading, grocery shopping for heart health, heart healthy recipe modifications, and ways to make healthier choices when eating out.   Diabetes Question & Answer:  -Group instruction provided by PowerPoint slides, verbal discussion, and written materials to support subject matter. The instructor gives an explanation and review of diabetes co-morbidities, pre- and post-prandial blood glucose goals, pre-exercise blood glucose goals, signs, symptoms, and treatment of hypoglycemia and hyperglycemia, and foot care basics.   Diabetes Blitz:  -Group instruction provided by PowerPoint slides, verbal discussion, and written materials to support subject matter. The instructor gives an explanation and review of the physiology behind type 1 and type 2 diabetes, diabetes medications and rational behind using different medications, pre- and post-prandial blood glucose recommendations and Hemoglobin A1c goals, diabetes diet, and exercise including blood glucose guidelines  for exercising safely.    Portion Distortion:  -Group instruction provided by PowerPoint slides, verbal discussion, written materials, and food models to support subject matter. The instructor gives an explanation of serving size versus portion size,  changes in portions sizes over the last 20 years, and what consists of a serving from each food group.   Stress Management:  -Group instruction provided by verbal instruction, video, and written materials to support subject matter.  Instructors review role of stress in heart disease and how to cope with stress positively.     Exercising on Your Own:  -Group instruction provided by verbal instruction, power point, and written materials to support subject.  Instructors discuss benefits of exercise, components of exercise, frequency and intensity of exercise, and end points for exercise.  Also discuss use of nitroglycerin and activating EMS.  Review options of places to exercise outside of rehab.  Review guidelines for sex with heart disease.   Cardiac Drugs I:  -Group instruction provided by verbal instruction and written materials to support subject.  Instructor reviews cardiac drug classes: antiplatelets, anticoagulants, beta blockers, and statins.  Instructor discusses reasons, side effects, and lifestyle considerations for each drug class.   Cardiac Drugs II:  -Group instruction provided by verbal instruction and written materials to support subject.  Instructor reviews cardiac drug classes: angiotensin converting enzyme inhibitors (ACE-I), angiotensin II receptor blockers (ARBs), nitrates, and calcium channel blockers.  Instructor discusses reasons, side effects, and lifestyle considerations for each drug class.   Anatomy and Physiology of the Circulatory System:  Group verbal and written instruction and models provide basic cardiac anatomy and physiology, with the coronary electrical and arterial systems. Review of: AMI, Angina, Valve  disease, Heart Failure, Peripheral Artery Disease, Cardiac Arrhythmia, Pacemakers, and the ICD.   Other Education:  -Group or individual verbal, written, or video instructions that support the educational goals of the cardiac rehab program.   Holiday Eating Survival Tips:  -Group instruction provided by PowerPoint slides, verbal discussion, and written materials to support subject matter. The instructor gives patients tips, tricks, and techniques to help them not only survive but enjoy the holidays despite the onslaught of food that accompanies the holidays.   Knowledge Questionnaire Score:   Core Components/Risk Factors/Patient Goals at Admission:   Core Components/Risk Factors/Patient Goals Review:  Goals and Risk Factor Review    Row Name 03/23/19 0805 04/27/19 1139           Core Components/Risk Factors/Patient Goals Review   Personal Goals Review  Weight Management/Obesity;Lipids;Stress;Hypertension  Weight Management/Obesity;Lipids;Stress;Hypertension      Review  Patient with multiple CAD risk factors. She is very eager to continue exercise and lifestyle modifications to decrease riskfactors.  Patient with multiple CAD risk factors.  Continues to participate in cardiac rehab plan of care/exercise independently at home.      Expected Outcomes  Patient will continue to participate in exercise and lifestyle modifications while in-person CR is suspended secondary to Covid-19 pandemic.  Patient will continue to participate in cardiac rehab for risk factor modification         Core Components/Risk Factors/Patient Goals at Discharge (Final Review):  Goals and Risk Factor Review - 04/27/19 1139      Core Components/Risk Factors/Patient Goals Review   Personal Goals Review  Weight Management/Obesity;Lipids;Stress;Hypertension    Review  Patient with multiple CAD risk factors.  Continues to participate in cardiac rehab plan of care/exercise independently at home.    Expected  Outcomes  Patient will continue to participate in cardiac rehab for risk factor modification       ITP Comments: ITP Comments    Row Name 03/23/19 0800 04/27/19 1137         ITP Comments  30 day ITP review: Ms. Pearse is doing very well in cardiac rehab. She has attended all exercise sessions since admittion. VSS. Denies complaints. She is self motivated and suggest when she needs a workload increase. She is not interested in participating in the virtual CR app during in-person closure however she request to be contacted once department re-opens to inperson exercise.  30 day ITP review: In-person Cardiac Rehab exercise session will resume. Patient will be contacted to see if interested. Currently patient is following their cardiac rehab plan of care by exercising at home independently.         Comments: See ITP comments

## 2019-04-28 ENCOUNTER — Other Ambulatory Visit: Payer: Self-pay | Admitting: Family Medicine

## 2019-04-28 DIAGNOSIS — M8589 Other specified disorders of bone density and structure, multiple sites: Secondary | ICD-10-CM

## 2019-04-30 DIAGNOSIS — M0589 Other rheumatoid arthritis with rheumatoid factor of multiple sites: Secondary | ICD-10-CM | POA: Diagnosis not present

## 2019-05-04 ENCOUNTER — Ambulatory Visit (INDEPENDENT_AMBULATORY_CARE_PROVIDER_SITE_OTHER): Payer: Medicare Other | Admitting: Interventional Cardiology

## 2019-05-04 ENCOUNTER — Other Ambulatory Visit: Payer: Self-pay

## 2019-05-04 ENCOUNTER — Encounter (HOSPITAL_COMMUNITY)
Admission: RE | Admit: 2019-05-04 | Discharge: 2019-05-04 | Disposition: A | Discharge status: Home / Self Care | Payer: Medicare Other | Source: Ambulatory Visit | Attending: Interventional Cardiology | Admitting: Interventional Cardiology

## 2019-05-04 ENCOUNTER — Encounter: Payer: Self-pay | Admitting: Interventional Cardiology

## 2019-05-04 VITALS — BP 114/62 | HR 66 | Ht 67.0 in | Wt 225.8 lb

## 2019-05-04 DIAGNOSIS — Z7189 Other specified counseling: Secondary | ICD-10-CM | POA: Diagnosis not present

## 2019-05-04 DIAGNOSIS — Z955 Presence of coronary angioplasty implant and graft: Secondary | ICD-10-CM

## 2019-05-04 DIAGNOSIS — I10 Essential (primary) hypertension: Secondary | ICD-10-CM

## 2019-05-04 DIAGNOSIS — E782 Mixed hyperlipidemia: Secondary | ICD-10-CM | POA: Diagnosis not present

## 2019-05-04 DIAGNOSIS — I25118 Atherosclerotic heart disease of native coronary artery with other forms of angina pectoris: Secondary | ICD-10-CM

## 2019-05-04 DIAGNOSIS — I739 Peripheral vascular disease, unspecified: Secondary | ICD-10-CM

## 2019-05-04 NOTE — Progress Notes (Signed)
Cardiology Office Note:    Date:  05/04/2019   ID:  Pamela Lowe, DOB 1950/09/16, MRN [id number]  PCP:  [name], PA-C  Cardiologist:  Sinclair Grooms, MD   Referring MD: [name], PA-C   Chief Complaint  Patient presents with  . Coronary Artery Disease    History of Present Illness:    Pamela Lowe is a 69 y.o. female with a hx of CAD with significant occlusive LAD disease per coronary CTA/FFR performed 08/2018, hyperlipidemia, hypertension and PAD.  Stent LAD total occlusion and first diagonal October 2020.  She compliments last for "saving her life".  Dyspnea and chest discomfort have completely resolved after stenting the LAD and diagonal.  She has had no side effects on her current medical regimen.  She is very pleased with the outcome of her procedure.  She has not been able to exercise as before because of the COVID-19 pandemic and the winter season.  She is in the virtual cardiac rehab program and has no limitations.  Past Medical History:  Diagnosis Date  . Allergic rhinitis   . Breast cyst 2007   Right  . Cataract    surgery 07/2017  . Depression 2006   w/ menopause  . Family history of breast cancer   . Family history of colon cancer   . Family history of kidney cancer 01/15/2018  . Family history of lung cancer   . Family history of pancreatic cancer   . Family history of prostate cancer   . Family history of uterine cancer   . GERD (gastroesophageal reflux disease)   . Glaucoma   . History of hysterectomy, supracervical   . Hyperlipidemia   . Hypertension   . 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   MLH1 [id number] (Splice site)  . Multinodular goiter   . Tobacco user     Past Surgical History:  Procedure Laterality Date  . BREAST BIOPSY Bilateral 1985  . CARDIAC CATHETERIZATION    . CATARACT EXTRACTION Right 07/2017  . COLONOSCOPY    . CORONARY STENT INTERVENTION N/A 12/26/2018   Procedure: CORONARY STENT INTERVENTION;   Surgeon: Belva Crome, MD;  Location: [facility];  Service: Cardiovascular;  Laterality: N/A;  . LEFT HEART CATH AND CORONARY ANGIOGRAPHY N/A 12/17/2018   Procedure: LEFT HEART CATH AND CORONARY ANGIOGRAPHY;  Surgeon: Belva Crome, MD;  Location: [facility];  Service: Cardiovascular;  Laterality: N/A;  . SUPRACERVICAL ABDOMINAL HYSTERECTOMY  1986   h/o supracervical hysterectomy  . UPPER GASTROINTESTINAL ENDOSCOPY      Current Medications: Current Meds  Medication Sig  . aspirin (ASPIRIN 81) 81 MG EC tablet Take 81 mg by mouth daily.   . benazepril (LOTENSIN) 20 MG tablet Take 1 tablet (20 mg total) by mouth daily.  . brimonidine (ALPHAGAN) 0.2 % ophthalmic solution Place 1 drop into both eyes 3 (three) times daily.   . Calcium Carb-Cholecalciferol (CALCIUM-VITAMIN D) 600-400 MG-UNIT TABS Take 1 tablet by mouth daily.  . cetirizine (ZYRTEC) 10 MG tablet Take 10 mg by mouth daily.  . clopidogrel (PLAVIX) 75 MG tablet Take 1 tablet (75 mg total) by mouth daily.  . dorzolamide (TRUSOPT) 2 % ophthalmic solution Place 1 drop into both eyes 2 (two) times daily.   [name] ezetimibe (ZETIA) 10 MG tablet Take 1 tablet (10 mg total) by mouth daily.  . folic acid (FOLVITE) 1 MG tablet Take 1 mg by mouth daily.   . Golimumab (Geronimo ARIA IV) Inject  12.5 mLs into the vein every 8 (eight) weeks.   . hydrochlorothiazide (MICROZIDE) 12.5 MG capsule Take 1 capsule (12.5 mg total) by mouth every Monday, Wednesday, and Friday.  . methotrexate (RHEUMATREX) 2.5 MG tablet Take 20 mg by mouth once a week. Mondays  . metoprolol succinate (TOPROL-XL) 25 MG 24 hr tablet Take 1 tablet (25 mg total) by mouth daily.  . Multiple Vitamins-Minerals (MULTIVITAMIN WITH MINERALS) tablet Take 1 tablet by mouth daily.  . Omega-3 Fatty Acids (FISH OIL) 1000 MG CAPS Take by mouth daily.   . pantoprazole (PROTONIX) 40 MG tablet Take 1 tablet (40 mg total) by mouth daily.  . prednisoLONE acetate (PRED FORTE) 1 %  ophthalmic suspension Place 1 drop into both eyes every other day.   . predniSONE (DELTASONE) 5 MG tablet Take 5 mg by mouth daily.     Allergies:   Bupropion, Sulfamethoxazole-trimethoprim, and Wellbutrin [bupropion hcl]   Social History   Socioeconomic History  . Marital status: Married    Spouse name: [name]  . Number of children: 0  . Years of education: Not on file  . Highest education level: Master's degree (e.g., MA, MS, MEng, MEd, MSW, MBA)  Occupational History  . Occupation: Special Ed Teacher    Comment: Engineer, manufacturing  Tobacco Use  . Smoking status: Former Smoker    Packs/day: 1.50    Years: 39.00    Pack years: 58.50    Types: Cigarettes    Quit date: 08/01/2017    Years since quitting: 1.7  . Smokeless tobacco: Never Used  . Tobacco comment: using nicoderm patches  Substance and Sexual Activity  . Alcohol use: Not Currently  . Drug use: No  . Sexual activity: Yes    Partners: Male    Birth control/protection: Surgical, Post-menopausal    Comment: Hysterectomy  Other Topics Concern  . Not on file  Social History Narrative  . Not on file   Social Determinants of Health   Financial Resource Strain: Low Risk   . Difficulty of Paying Living Expenses: Not hard at all  Food Insecurity: No Food Insecurity  . Worried About Charity fundraiser in the Last Year: Never true  . Ran Out of Food in the Last Year: Never true  Transportation Needs: No Transportation Needs  . Lack of Transportation (Medical): No  . Lack of Transportation (Non-Medical): No  Physical Activity: Inactive  . Days of Exercise per Week: 0 days  . Minutes of Exercise per Session: 0 min  Stress: Stress Concern Present  . Feeling of Stress : Rather much  Social Connections:   . Frequency of Communication with Friends and Family: Not on file  . Frequency of Social Gatherings with Friends and Family: Not on file  . Attends Religious Services: Not on file  . Active Member of Clubs or  Organizations: Not on file  . Attends Archivist Meetings: Not on file  . Marital Status: Not on file     Family History: The patient's family history includes Allergies in her brother; Alzheimer's disease in her mother; Brain cancer in her paternal aunt; Breast cancer (age of onset: 71) in her sister; Breast cancer (age of onset: 25) in her sister; Breast cancer (age of onset: 51) in her sister; Breast cancer (age of onset: 11) in her cousin; Breast cancer (age of onset: 63) in her maternal aunt; Clotting disorder in her sister; Colon cancer (age of onset: 48) in her paternal grandfather; Deep vein thrombosis in  her mother and sister; Heart attack in her maternal grandfather; Heart attack (age of onset: 47) in her brother; Hypertension in her father and mother; Kidney cancer (age of onset: 62) in her cousin; Kidney cancer (age of onset: 14) in her father; Kidney cancer (age of onset: 62) in her cousin; Lung cancer (age of onset: 30) in her paternal uncle; Lung cancer (age of onset: 83) in her maternal uncle; Osteopenia in her mother; Pancreatic cancer (age of onset: 24) in her paternal uncle; Pancreatic cancer (age of onset: 81) in her paternal aunt; Pancreatic cancer (age of onset: 80) in her paternal aunt; Pneumonia in her father; Prostate cancer (age of onset: 6) in her cousin; Prostate cancer (age of onset: 57) in her maternal uncle; Uterine cancer (age of onset: 35) in her cousin; Uterine cancer (age of onset: 10) in her paternal grandmother. There is no history of Thyroid disease.  ROS:   Please see the history of present illness.    Deal being troubled by chronic inflammatory disease/rheumatoid arthritis and uveitis.  All other systems reviewed and are negative.  EKGs/Labs/Other Studies Reviewed:    The following studies were reviewed today: Coronary angiography/PCI October 2020: Intervention     EKG:  EKG not repeated  Recent Labs: 05/22/2018: NT-Pro BNP 37 10/20/2018: ALT 38  12/27/2018: BUN 9; Creatinine, Ser 0.99; Hemoglobin 11.0; Platelets 261; Potassium 3.8; Sodium 141  Recent Lipid Panel    Component Value Date/Time   CHOL 190 10/20/2018 0907   TRIG 103 10/20/2018 0907   HDL 78 10/20/2018 0907   CHOLHDL 2.4 10/20/2018 0907   LDLCALC 91 10/20/2018 0907    Physical Exam:    VS:  BP 114/62   Pulse 66   Ht 5' 7"  (1.702 m)   Wt 225 lb 12.8 oz (102.4 kg)   LMP 03/19/1998 Comment: h/o supracervical hysterectomy  SpO2 99%   BMI 35.37 kg/m     Wt Readings from Last 3 Encounters:  05/04/19 225 lb 12.8 oz (102.4 kg)  03/17/19 223 lb 8.7 oz (101.4 kg)  01/29/19 231 lb 11.3 oz (105.1 kg)     GEN: Alert obesity. No acute distress HEENT: Normal NECK: No JVD. LYMPHATICS: No lymphadenopathy CARDIAC:  RRR without murmur, gallop, or edema. VASCULAR:  Normal Pulses. No bruits. RESPIRATORY:  Clear to auscultation without rales, wheezing or rhonchi  ABDOMEN: Soft, non-tender, non-distended, No pulsatile mass, MUSCULOSKELETAL: No deformity  SKIN: Warm and dry NEUROLOGIC:  Alert and oriented x 3 PSYCHIATRIC:  Normal affect   ASSESSMENT:    1. Coronary artery disease involving native coronary artery of native heart with other form of angina pectoris ([place])   2. PAD (peripheral artery disease) ([place])   3. Essential hypertension   4. Mixed hyperlipidemia   5. Educated about COVID-19 virus infection    PLAN:    In order of problems listed above:  1. Secondary prevention reiterated.  She is now asymptomatic after stent implantation.  When she returns in October, plan to drop aspirin and continue Plavix. 2. No claudication complaints.  Encourage aerobic activity.  Consider low-dose rivaroxaban. 3. Blood pressure is adequately controlled. 4. Target LDL less than 70. 5. 3W's and COVID-19 vaccine are endorsed.  Overall education and awareness concerning primary/secondary risk prevention was discussed in detail: LDL less than 70, hemoglobin A1c less than 7,  blood pressure target less than 130/80 mmHg, >150 minutes of moderate aerobic activity per week, avoidance of smoking, weight control (via diet and exercise), and continued surveillance/management  of/for obstructive sleep apnea.  Consider discontinuing aspirin in October.  Continue Plavix.   Medication Adjustments/Labs and Tests Ordered: Current medicines are reviewed at length with the patient today.  Concerns regarding medicines are outlined above.  Orders Placed This Encounter  Procedures  . Hepatic function panel  . Lipid panel   No orders of the defined types were placed in this encounter.   Patient Instructions  Medication Instructions:  Your physician recommends that you continue on your current medications as directed. Please refer to the Current Medication list given to you today.  *If you need a refill on your cardiac medications before your next appointment, please call your pharmacy*  Lab Work: Liver and Lipid 5-10 days prior to seeing Dr. Tamala Julian back in 6 months.  You will need to be fasting for these labs (nothing to eat after midnight except water and black coffee)  If you have labs (blood work) drawn today and your tests are completely normal, you will receive your results only by: [name] MyChart Message (if you have MyChart) OR . A paper copy in the mail If you have any lab test that is abnormal or we need to change your treatment, we will call you to review the results.  Testing/Procedures: None  Follow-Up: At [facility], you and your health needs are our priority.  As part of our continuing mission to provide you with exceptional heart care, we have created designated Provider Care Teams.  These Care Teams include your primary Cardiologist (physician) and Advanced Practice Providers (APPs -  Physician Assistants and Nurse Practitioners) who all work together to provide you with the care you need, when you need it.  Your next appointment:   6 month(s)  The format  for your next appointment:   In Person  Provider:   You may see Sinclair Grooms, MD or one of the following Advanced Practice Providers on your designated Care Team:    [name], NP  Cecilie Kicks, NP  Kathyrn Drown, NP   Other Instructions      Signed, Sinclair Grooms, MD  05/04/2019 12:52 PM    [place]

## 2019-05-04 NOTE — Progress Notes (Signed)
Daily Session Note  Patient Details  Name: Pamela Lowe MRN: [id number] Date of Birth: 12-12-1950 Referring Provider:     CARDIAC REHAB PHASE II ORIENTATION from 01/29/2019 in [place]  Referring Provider  Dr. Tamala Julian      Encounter Date: 05/04/2019  Check In: Session Check In - 05/04/19 1524      Check-In   Supervising physician immediately available to respond to emergencies  [facility] immediately available    Physician(s)  Dr. Maylene Roes    Location  [facility]    Staff Present  Trish Fountain, RN, Toma Deiters, RN, Mosie Epstein, MS,ACSM CEP, Exercise Physiologist;Brittany Durene Fruits, BS, ACSM CEP, Exercise Physiologist    Virtual Visit  No    Medication changes reported      No    Fall or balance concerns reported     No    Tobacco Cessation  No Change    Warm-up and Cool-down  Performed on first and last piece of equipment    Resistance Training Performed  Yes    VAD Patient?  No    PAD/SET Patient?  No      Pain Assessment   Currently in Pain?  No/denies    Multiple Pain Sites  No       Capillary Blood Glucose: No results found for this or any previous visit (from the past 24 hour(s)).    Social History   Tobacco Use  Smoking Status Former Smoker  . Packs/day: 1.50  . Years: 39.00  . Pack years: 58.50  . Types: Cigarettes  . Quit date: 08/01/2017  . Years since quitting: 1.7  Smokeless Tobacco Never Used  Tobacco Comment   using nicoderm patches    Goals Met:  Exercise tolerated well  Goals Unmet:  Not Applicable  Comments: Pt started cardiac rehab today.  Pt tolerated light exercise without difficulty. VSS, telemetry-SR, asymptomatic.  Medication list reconciled. Pt denies barriers to medicaiton compliance.      Dr. Fransico Him is Medical Director for Cardiac Rehab at [facility].

## 2019-05-04 NOTE — Patient Instructions (Signed)
Medication Instructions:  Your physician recommends that you continue on your current medications as directed. Please refer to the Current Medication list given to you today.  *If you need a refill on your cardiac medications before your next appointment, please call your pharmacy*  Lab Work: Liver and Lipid 5-10 days prior to seeing Dr. Tamala Julian back in 6 months.  You will need to be fasting for these labs (nothing to eat after midnight except water and black coffee)  If you have labs (blood work) drawn today and your tests are completely normal, you will receive your results only by: [name] MyChart Message (if you have MyChart) OR . A paper copy in the mail If you have any lab test that is abnormal or we need to change your treatment, we will call you to review the results.  Testing/Procedures: None  Follow-Up: At [facility], you and your health needs are our priority.  As part of our continuing mission to provide you with exceptional heart care, we have created designated Provider Care Teams.  These Care Teams include your primary Cardiologist (physician) and Advanced Practice Providers (APPs -  Physician Assistants and Nurse Practitioners) who all work together to provide you with the care you need, when you need it.  Your next appointment:   6 month(s)  The format for your next appointment:   In Person  Provider:   You may see Sinclair Grooms, MD or one of the following Advanced Practice Providers on your designated Care Team:    [name], NP  Cecilie Kicks, NP  Kathyrn Drown, NP   Other Instructions

## 2019-05-05 ENCOUNTER — Encounter: Payer: Self-pay | Admitting: Obstetrics & Gynecology

## 2019-05-05 DIAGNOSIS — E669 Obesity, unspecified: Secondary | ICD-10-CM | POA: Diagnosis not present

## 2019-05-05 DIAGNOSIS — Z6836 Body mass index (BMI) 36.0-36.9, adult: Secondary | ICD-10-CM | POA: Diagnosis not present

## 2019-05-05 DIAGNOSIS — H209 Unspecified iridocyclitis: Secondary | ICD-10-CM | POA: Diagnosis not present

## 2019-05-05 DIAGNOSIS — M2550 Pain in unspecified joint: Secondary | ICD-10-CM | POA: Diagnosis not present

## 2019-05-05 DIAGNOSIS — Z1231 Encounter for screening mammogram for malignant neoplasm of breast: Secondary | ICD-10-CM | POA: Diagnosis not present

## 2019-05-05 DIAGNOSIS — Z79899 Other long term (current) drug therapy: Secondary | ICD-10-CM | POA: Diagnosis not present

## 2019-05-05 DIAGNOSIS — R21 Rash and other nonspecific skin eruption: Secondary | ICD-10-CM | POA: Diagnosis not present

## 2019-05-05 DIAGNOSIS — Z13820 Encounter for screening for osteoporosis: Secondary | ICD-10-CM | POA: Diagnosis not present

## 2019-05-05 DIAGNOSIS — M0589 Other rheumatoid arthritis with rheumatoid factor of multiple sites: Secondary | ICD-10-CM | POA: Diagnosis not present

## 2019-05-06 ENCOUNTER — Other Ambulatory Visit: Payer: Self-pay

## 2019-05-06 ENCOUNTER — Encounter (HOSPITAL_COMMUNITY)
Admission: RE | Admit: 2019-05-06 | Discharge: 2019-05-06 | Disposition: A | Discharge status: Home / Self Care | Payer: Medicare Other | Source: Ambulatory Visit | Attending: Interventional Cardiology | Admitting: Interventional Cardiology

## 2019-05-06 DIAGNOSIS — Z955 Presence of coronary angioplasty implant and graft: Secondary | ICD-10-CM | POA: Diagnosis not present

## 2019-05-08 ENCOUNTER — Encounter (HOSPITAL_COMMUNITY)
Admission: RE | Admit: 2019-05-08 | Discharge: 2019-05-08 | Disposition: A | Discharge status: Home / Self Care | Payer: Medicare Other | Source: Ambulatory Visit | Attending: Interventional Cardiology | Admitting: Interventional Cardiology

## 2019-05-08 ENCOUNTER — Other Ambulatory Visit: Payer: Self-pay

## 2019-05-08 DIAGNOSIS — Z955 Presence of coronary angioplasty implant and graft: Secondary | ICD-10-CM

## 2019-05-11 ENCOUNTER — Other Ambulatory Visit: Payer: Self-pay

## 2019-05-11 ENCOUNTER — Encounter (HOSPITAL_COMMUNITY)
Admission: RE | Admit: 2019-05-11 | Discharge: 2019-05-11 | Disposition: A | Discharge status: Home / Self Care | Payer: Medicare Other | Source: Ambulatory Visit | Attending: Interventional Cardiology | Admitting: Interventional Cardiology

## 2019-05-11 DIAGNOSIS — Z955 Presence of coronary angioplasty implant and graft: Secondary | ICD-10-CM

## 2019-05-12 ENCOUNTER — Ambulatory Visit: Payer: Medicare Other | Admitting: Interventional Cardiology

## 2019-05-13 ENCOUNTER — Encounter (HOSPITAL_COMMUNITY)
Admission: RE | Admit: 2019-05-13 | Discharge: 2019-05-13 | Disposition: A | Discharge status: Home / Self Care | Payer: Medicare Other | Source: Ambulatory Visit | Attending: Interventional Cardiology | Admitting: Interventional Cardiology

## 2019-05-13 ENCOUNTER — Other Ambulatory Visit: Payer: Self-pay

## 2019-05-13 DIAGNOSIS — Z955 Presence of coronary angioplasty implant and graft: Secondary | ICD-10-CM

## 2019-05-13 DIAGNOSIS — H401133 Primary open-angle glaucoma, bilateral, severe stage: Secondary | ICD-10-CM | POA: Diagnosis not present

## 2019-05-13 DIAGNOSIS — H20023 Recurrent acute iridocyclitis, bilateral: Secondary | ICD-10-CM | POA: Diagnosis not present

## 2019-05-13 DIAGNOSIS — H40043 Steroid responder, bilateral: Secondary | ICD-10-CM | POA: Diagnosis not present

## 2019-05-13 DIAGNOSIS — Z961 Presence of intraocular lens: Secondary | ICD-10-CM | POA: Diagnosis not present

## 2019-05-15 ENCOUNTER — Other Ambulatory Visit: Payer: Self-pay

## 2019-05-15 ENCOUNTER — Telehealth: Payer: Self-pay

## 2019-05-15 ENCOUNTER — Encounter: Payer: Self-pay | Admitting: Obstetrics & Gynecology

## 2019-05-15 ENCOUNTER — Encounter (HOSPITAL_COMMUNITY)
Admission: RE | Admit: 2019-05-15 | Discharge: 2019-05-15 | Disposition: A | Discharge status: Home / Self Care | Payer: Medicare Other | Source: Ambulatory Visit | Attending: Interventional Cardiology | Admitting: Interventional Cardiology

## 2019-05-15 DIAGNOSIS — Z955 Presence of coronary angioplasty implant and graft: Secondary | ICD-10-CM

## 2019-05-15 DIAGNOSIS — Z9189 Other specified personal risk factors, not elsewhere classified: Secondary | ICD-10-CM

## 2019-05-15 DIAGNOSIS — Z1589 Genetic susceptibility to other disease: Secondary | ICD-10-CM

## 2019-05-15 DIAGNOSIS — R922 Inconclusive mammogram: Secondary | ICD-10-CM | POA: Diagnosis not present

## 2019-05-15 DIAGNOSIS — N6011 Diffuse cystic mastopathy of right breast: Secondary | ICD-10-CM | POA: Diagnosis not present

## 2019-05-15 NOTE — Telephone Encounter (Signed)
Spoke with patient. Patient would like to proceed with Lap BSO May 10th or 11th. Patient is still undergoing cardiac rehab that she expects will end at the end of March. Patient is receiving Golimumab every 8 weeks and is due for her next infusion 06/25/2019. Would like to wait 5 weeks out for surgery stating that this lowers her immune system. Advised patient will review time frame with Dr.Miller and return call. Patient is agreeable.  Dr.Miller, okay to proceed with surgery scheduling for 5/10 or 5/11? Will patient need any medical clearance?

## 2019-05-17 ENCOUNTER — Ambulatory Visit: Payer: Medicare Other

## 2019-05-18 ENCOUNTER — Encounter (HOSPITAL_COMMUNITY)
Admission: RE | Admit: 2019-05-18 | Discharge: 2019-05-18 | Disposition: A | Discharge status: Home / Self Care | Payer: Medicare Other | Source: Ambulatory Visit | Attending: Interventional Cardiology | Admitting: Interventional Cardiology

## 2019-05-18 ENCOUNTER — Other Ambulatory Visit: Payer: Self-pay

## 2019-05-18 VITALS — Ht 67.0 in | Wt 225.0 lb

## 2019-05-18 DIAGNOSIS — Z955 Presence of coronary angioplasty implant and graft: Secondary | ICD-10-CM

## 2019-05-19 ENCOUNTER — Ambulatory Visit: Payer: Medicare Other | Attending: Internal Medicine

## 2019-05-19 DIAGNOSIS — Z23 Encounter for immunization: Secondary | ICD-10-CM | POA: Insufficient documentation

## 2019-05-19 NOTE — Progress Notes (Signed)
   Covid-19 Vaccination Clinic  Name:  Pamela Lowe    MRN: [id number] DOB: 07/22/1950  05/19/2019  [name] was observed post Covid-19 immunization for 15 minutes without incident. She was provided with Vaccine Information Sheet and instruction to access the V-Safe system.   [name] was instructed to call 911 with any severe reactions post vaccine: [name] Difficulty breathing  . Swelling of face and throat  . A fast heartbeat  . A bad rash all over body  . Dizziness and weakness   Immunizations Administered    Name Date Dose VIS Date Route   Pfizer COVID-19 Vaccine 05/19/2019 12:10 PM 0.3 mL 02/27/2019 Intramuscular   Manufacturer: [place]   Lot: [id number]   [place]: [id number]

## 2019-05-20 ENCOUNTER — Encounter (HOSPITAL_COMMUNITY)
Admission: RE | Admit: 2019-05-20 | Discharge: 2019-05-20 | Disposition: A | Discharge status: Home / Self Care | Payer: Medicare Other | Source: Ambulatory Visit | Attending: Interventional Cardiology | Admitting: Interventional Cardiology

## 2019-05-20 ENCOUNTER — Encounter: Payer: Self-pay | Admitting: Gastroenterology

## 2019-05-20 ENCOUNTER — Other Ambulatory Visit: Payer: Self-pay

## 2019-05-20 ENCOUNTER — Ambulatory Visit (INDEPENDENT_AMBULATORY_CARE_PROVIDER_SITE_OTHER): Payer: Medicare Other | Admitting: Gastroenterology

## 2019-05-20 VITALS — BP 128/70 | HR 62 | Temp 97.7°F | Ht 67.0 in | Wt 220.0 lb

## 2019-05-20 VITALS — Ht 67.25 in | Wt 221.1 lb

## 2019-05-20 DIAGNOSIS — Z955 Presence of coronary angioplasty implant and graft: Secondary | ICD-10-CM

## 2019-05-20 DIAGNOSIS — Z1509 Genetic susceptibility to other malignant neoplasm: Secondary | ICD-10-CM

## 2019-05-20 DIAGNOSIS — I25118 Atherosclerotic heart disease of native coronary artery with other forms of angina pectoris: Secondary | ICD-10-CM | POA: Diagnosis not present

## 2019-05-20 NOTE — Patient Instructions (Signed)
If you are age 68 or older, your body mass index should be between 23-30. Your Body mass index is 34.46 kg/m. If this is out of the aforementioned range listed, please consider follow up with your Primary Care Provider.  If you are age 55 or younger, your body mass index should be between 19-25. Your Body mass index is 34.46 kg/m. If this is out of the aformentioned range listed, please consider follow up with your Primary Care Provider.   We will reach out to Dr Tamala Julian to see when it will be OK for you to hold Plavix for colonoscopy.  Once we hear from Dr Tamala Julian, we will contact you.  Thank you, Dr Ardis Hughs

## 2019-05-20 NOTE — Telephone Encounter (Signed)
Spoke with patient. Patient states that she went in for a colonoscopy and was unable to have this done because her cardiologist Dr.Smith does not want her to come off her Aspirin or Plavix for at least another 6 months. States she will not be able to proceed with surgery due to this. Advised will notify Dr.Miller and return call with any additional information or questions.

## 2019-05-20 NOTE — Progress Notes (Signed)
HPI: This is a very pleasant 69 year old woman whom I last saw February 2020 for colonoscopy and upper endoscopy.  See those results summarized below.  She has significant coronary artery disease.  June 2020 she underwent stenting of LAD.  October 2020 she had a total stent occlusion of the LAD and also first diagonal.  She is currently on dual therapy and Plavix right now.  Her cardiologist plans to drop the aspirin in October 2021 as long as she is doing well.  She has Lynch syndrome MLH1 mutation.  February 2020 EGD showed mild gastritis.  H. pylori negative on biopsy colonoscopy February 2020 showed left-sided diverticulosis, 2 mm tubular adenoma.  She has had no overt GI difficulties.  Specifically no bleeding, no constipation or diarrhea.  No significant abdominal pains.  Chief complaint is Lynch syndrome  ROS: complete GI ROS as described in HPI, all other review negative.  Constitutional:  No unintentional weight loss   Past Medical History:  Diagnosis Date  . Allergic rhinitis   . Breast cyst 2007   Right  . Cataract    surgery 07/2017  . Depression 2006   w/ menopause  . Family history of breast cancer   . Family history of colon cancer   . Family history of kidney cancer 01/15/2018  . Family history of lung cancer   . Family history of pancreatic cancer   . Family history of prostate cancer   . Family history of uterine cancer   . GERD (gastroesophageal reflux disease)   . Glaucoma   . History of hysterectomy, supracervical   . Hyperlipidemia   . Hypertension   . 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   MLH1 [id number] (Splice site)  . Multinodular goiter   . Tobacco user     Past Surgical History:  Procedure Laterality Date  . BREAST BIOPSY Bilateral 1985  . CARDIAC CATHETERIZATION    . CATARACT EXTRACTION Right 07/2017  . COLONOSCOPY    . CORONARY STENT INTERVENTION N/A 12/26/2018   Procedure: CORONARY STENT INTERVENTION;  Surgeon: Belva Crome, MD;   Location: [facility];  Service: Cardiovascular;  Laterality: N/A;  . LEFT HEART CATH AND CORONARY ANGIOGRAPHY N/A 12/17/2018   Procedure: LEFT HEART CATH AND CORONARY ANGIOGRAPHY;  Surgeon: Belva Crome, MD;  Location: [facility];  Service: Cardiovascular;  Laterality: N/A;  . SUPRACERVICAL ABDOMINAL HYSTERECTOMY  1986   h/o supracervical hysterectomy  . UPPER GASTROINTESTINAL ENDOSCOPY      Current Outpatient Medications  Medication Sig Dispense Refill  . aspirin (ASPIRIN 81) 81 MG EC tablet Take 81 mg by mouth daily.     . benazepril (LOTENSIN) 20 MG tablet Take 1 tablet (20 mg total) by mouth daily. 90 tablet 3  . brimonidine (ALPHAGAN) 0.2 % ophthalmic solution Place 1 drop into both eyes 3 (three) times daily.     . Calcium Carb-Cholecalciferol (CALCIUM-VITAMIN D) 600-400 MG-UNIT TABS Take 1 tablet by mouth daily.    . cetirizine (ZYRTEC) 10 MG tablet Take 10 mg by mouth daily.    . clopidogrel (PLAVIX) 75 MG tablet Take 1 tablet (75 mg total) by mouth daily. 90 tablet 3  . dorzolamide (TRUSOPT) 2 % ophthalmic solution Place 1 drop into both eyes 2 (two) times daily.     [name] ezetimibe (ZETIA) 10 MG tablet Take 1 tablet (10 mg total) by mouth daily. 90 tablet 3  . folic acid (FOLVITE) 1 MG tablet Take 1 mg by mouth daily.     [name]  Golimumab (SIMPONI ARIA IV) Inject 12.5 mLs into the vein every 8 (eight) weeks.     . hydrochlorothiazide (MICROZIDE) 12.5 MG capsule Take 1 capsule (12.5 mg total) by mouth every Monday, Wednesday, and Friday. 45 capsule 3  . methotrexate (RHEUMATREX) 2.5 MG tablet Take 20 mg by mouth once a week. Mondays    . metoprolol succinate (TOPROL-XL) 25 MG 24 hr tablet Take 1 tablet (25 mg total) by mouth daily. 90 tablet 3  . Multiple Vitamins-Minerals (MULTIVITAMIN WITH MINERALS) tablet Take 1 tablet by mouth daily.    . Omega-3 Fatty Acids (FISH OIL) 1000 MG CAPS Take by mouth daily.     . pantoprazole (PROTONIX) 40 MG tablet Take 1 tablet (40 mg  total) by mouth daily. 90 tablet 3  . prednisoLONE acetate (PRED FORTE) 1 % ophthalmic suspension Place 1 drop into both eyes every other day.     . predniSONE (DELTASONE) 5 MG tablet Take 5 mg by mouth daily.    . nitroGLYCERIN (NITROSTAT) 0.4 MG SL tablet Place 1 tablet (0.4 mg total) under the tongue every 5 (five) minutes as needed for chest pain. 25 tablet 3   No current facility-administered medications for this visit.    Allergies as of 05/20/2019 - Review Complete 05/20/2019  Allergen Reaction Noted  . Bupropion Rash 11/08/2010  . Sulfamethoxazole-trimethoprim Other (See Comments) and Rash 10/25/2017  . Wellbutrin [bupropion hcl] Rash 11/08/2010    Family History  Problem Relation Age of Onset  . Kidney cancer Father 90  . Pneumonia Father   . Hypertension Father   . Hypertension Mother   . Deep vein thrombosis Mother   . Alzheimer's disease Mother   . Osteopenia Mother   . Heart attack Maternal Grandfather   . Breast cancer Sister 37  . Allergies Brother   . Breast cancer Sister 26       Stage 0  . Breast cancer Sister 73       Stage 3  . Heart attack Brother 4  . Clotting disorder Sister   . Deep vein thrombosis Sister        unclear cause  . Pancreatic cancer Paternal Aunt 28  . Breast cancer Maternal Aunt 67  . Prostate cancer Maternal Uncle 68  . Pancreatic cancer Paternal Uncle 9  . Uterine cancer Paternal Grandmother 49  . Colon cancer Paternal Grandfather 45  . Pancreatic cancer Paternal Aunt 74  . Lung cancer Paternal Uncle 31  . Brain cancer Paternal Aunt   . Kidney cancer Cousin 81  . Kidney cancer Cousin 24  . Uterine cancer Cousin 21  . Prostate cancer Cousin 3  . Lung cancer Maternal Uncle 69  . Breast cancer Cousin 76  . Thyroid disease Neg Hx     Social History   Socioeconomic History  . Marital status: Married    Spouse name: [name]  . Number of children: 0  . Years of education: Not on file  . Highest education level: Master's  degree (e.g., MA, MS, MEng, MEd, MSW, MBA)  Occupational History  . Occupation: Special Ed Teacher    Comment: Engineer, manufacturing  Tobacco Use  . Smoking status: Former Smoker    Packs/day: 1.50    Years: 39.00    Pack years: 58.50    Types: Cigarettes    Quit date: 08/01/2017    Years since quitting: 1.8  . Smokeless tobacco: Never Used  . Tobacco comment: using nicoderm patches  Substance and Sexual Activity  .  Alcohol use: Not Currently  . Drug use: No  . Sexual activity: Yes    Partners: Male    Birth control/protection: Surgical, Post-menopausal    Comment: Hysterectomy  Other Topics Concern  . Not on file  Social History Narrative  . Not on file   Social Determinants of Health   Financial Resource Strain: Low Risk   . Difficulty of Paying Living Expenses: Not hard at all  Food Insecurity: No Food Insecurity  . Worried About Charity fundraiser in the Last Year: Never true  . Ran Out of Food in the Last Year: Never true  Transportation Needs: No Transportation Needs  . Lack of Transportation (Medical): No  . Lack of Transportation (Non-Medical): No  Physical Activity: Inactive  . Days of Exercise per Week: 0 days  . Minutes of Exercise per Session: 0 min  Stress: Stress Concern Present  . Feeling of Stress : Rather much  Social Connections:   . Frequency of Communication with Friends and Family: Not on file  . Frequency of Social Gatherings with Friends and Family: Not on file  . Attends Religious Services: Not on file  . Active Member of Clubs or Organizations: Not on file  . Attends Archivist Meetings: Not on file  . Marital Status: Not on file  Intimate Partner Violence:   . Fear of Current or Ex-Partner: Not on file  . Emotionally Abused: Not on file  . Physically Abused: Not on file  . Sexually Abused: Not on file     Physical Exam: BP 128/70   Pulse 62   Temp 97.7 F (36.5 C)   Ht 5' 7"  (1.702 m)   Wt 220 lb (99.8 kg)   LMP 03/19/1998  Comment: h/o supracervical hysterectomy  BMI 34.46 kg/m  Constitutional: generally well-appearing Psychiatric: alert and oriented x3 Abdomen: soft, nontender, nondistended, no obvious ascites, no peritoneal signs, normal bowel sounds No peripheral edema noted in lower extremities  Assessment and plan: 69 y.o. female with Lynch syndrome  Colonoscopy is recommended to be done annually for people with proven Lynch syndrome.  Her last colonoscopy was 1 year ago.  Unfortunately she has had some significant coronary artery disease and has been put on dual therapy aspirin and Plavix for now.  From reviewing her cardiology note it looks like she will probably be able to stop the aspirin around October 2021.  She understands that doing a colonoscopy while she is on Plavix is  higher risk than usual.  I am going to reach out to her cardiologist to inquire about when he feels it would probably be safe to hold her Plavix for 5 days, I am expecting that it would probably be safer towards the end of this calendar year.  If that is the case I think it would be fine to wait for l her colonoscopy to done until around then.  Please see the "Patient Instructions" section for addition details about the plan.  Owens Loffler, MD [facility] 05/20/2019, 10:07 AM   Total time on date of encounter was 30 minutes (this included time spent preparing to see the patient reviewing records; obtaining and/or reviewing separately obtained history; performing a medically appropriate exam and/or evaluation; counseling and educating the patient and family if present; ordering medications, tests or procedures if applicable; and documenting clinical information in the health record).

## 2019-05-21 ENCOUNTER — Telehealth: Payer: Self-pay

## 2019-05-21 NOTE — Telephone Encounter (Signed)
-----   Message from Milus Banister, MD sent at 05/20/2019  6:06 PM EST ----- Hank, Thanks.  We'll get her back in our office in October and take it from there.  Lei Dower, Can you let her know that we've discussed everything with Dr. Tamala Julian and he agrees it is best that she not come of plavix or asa for now.  She needs ROV with me in early October to see how she is doing, schedule colonoscopy from that point as it will probably be safer to hold plavix 5 days then.  Thanks  ----- Message ----- From: Belva Crome, MD Sent: 05/20/2019   1:20 PM EST To: Milus Banister, MD  Dian Queen,  This would be perfect.  If you could wait until October or later, it would be fine to hold the Plavix and continue aspirin.  After the procedure she would resume Plavix and continue the aspirin.  Later this year I will plan to permanently DC aspirin when she is beyond the 63-month mark following complicated PCI.  Hank ----- Message ----- From: Milus Banister, MD Sent: 05/20/2019  10:23 AM EST To: Belva Crome, MD  Dr. Tamala Julian,  Ms. Forester has Lynch syndrome (significantly increased risk for colon cancer) and the usual recommendation is for colonoscopy to be performed once yearly for this.  I know she has had some serious coronary artery disease 2020 and she is currently on aspirin and Plavix.    I reviewed your last office note and it looks like she might be safe to come off of the aspirin later this year.  When do you think it would be okay to hold her Plavix for 5 days for a colonoscopy?.    She could stay on aspirin during that time off of Plavix.  If you think it is likely safe enough later this calendar year, October or November December or so then I would simply wait to do the colonoscopy until around then I think that would be completely safe from a GI perspective as well.  Thanks  DJ

## 2019-05-21 NOTE — Telephone Encounter (Signed)
The patient has been notified of this information and all questions answered. She will call back in October to make an appt.

## 2019-05-21 NOTE — Telephone Encounter (Signed)
Tried calling patient regarding bone density results. No answer, left message for patient to call me back at [phone].

## 2019-05-22 ENCOUNTER — Other Ambulatory Visit: Payer: Self-pay

## 2019-05-22 ENCOUNTER — Encounter (HOSPITAL_COMMUNITY)
Admission: RE | Admit: 2019-05-22 | Discharge: 2019-05-22 | Disposition: A | Discharge status: Home / Self Care | Payer: Medicare Other | Source: Ambulatory Visit | Attending: Interventional Cardiology | Admitting: Interventional Cardiology

## 2019-05-22 DIAGNOSIS — Z955 Presence of coronary angioplasty implant and graft: Secondary | ICD-10-CM

## 2019-05-22 NOTE — Telephone Encounter (Signed)
Patient returning call to [facility].

## 2019-05-25 DIAGNOSIS — Z0000 Encounter for general adult medical examination without abnormal findings: Secondary | ICD-10-CM | POA: Diagnosis not present

## 2019-05-25 DIAGNOSIS — I10 Essential (primary) hypertension: Secondary | ICD-10-CM | POA: Diagnosis not present

## 2019-05-25 DIAGNOSIS — G4700 Insomnia, unspecified: Secondary | ICD-10-CM | POA: Diagnosis not present

## 2019-05-25 DIAGNOSIS — H209 Unspecified iridocyclitis: Secondary | ICD-10-CM | POA: Diagnosis not present

## 2019-05-25 DIAGNOSIS — K219 Gastro-esophageal reflux disease without esophagitis: Secondary | ICD-10-CM | POA: Diagnosis not present

## 2019-05-25 DIAGNOSIS — Z1159 Encounter for screening for other viral diseases: Secondary | ICD-10-CM | POA: Diagnosis not present

## 2019-05-25 DIAGNOSIS — L309 Dermatitis, unspecified: Secondary | ICD-10-CM | POA: Diagnosis not present

## 2019-05-25 DIAGNOSIS — R768 Other specified abnormal immunological findings in serum: Secondary | ICD-10-CM | POA: Diagnosis not present

## 2019-05-25 DIAGNOSIS — Z1509 Genetic susceptibility to other malignant neoplasm: Secondary | ICD-10-CM | POA: Diagnosis not present

## 2019-05-25 DIAGNOSIS — E042 Nontoxic multinodular goiter: Secondary | ICD-10-CM | POA: Diagnosis not present

## 2019-05-25 DIAGNOSIS — E7800 Pure hypercholesterolemia, unspecified: Secondary | ICD-10-CM | POA: Diagnosis not present

## 2019-05-25 NOTE — Progress Notes (Signed)
Discharge Progress Report  Patient Details  Name: Pamela Lowe MRN: [id number] Date of Birth: [birth year] Referring Provider:     CARDIAC REHAB PHASE II ORIENTATION from 01/29/2019 in [place]  Referring Provider  Dr. Tamala Julian       Number of Visits: 24  Reason for Discharge:  Patient reached a stable level of exercise. Patient independent in their exercise. Patient has met program and personal goals.  Smoking History:  Social History   Tobacco Use  Smoking Status Former Smoker  . Packs/day: 1.50  . Years: 39.00  . Pack years: 58.50  . Types: Cigarettes  . Quit date: 08/01/2017  . Years since quitting: 1.8  Smokeless Tobacco Never Used  Tobacco Comment   using nicoderm patches    Diagnosis:  S/P coronary artery stent placement S/P DES Mid Distal LAD 12/26/18  ADL UCSD:   Initial Exercise Prescription: Initial Exercise Prescription - 01/29/19 1500      Date of Initial Exercise RX and Referring Provider   Date  01/29/19    Referring Provider  Dr. Tamala Julian    Expected Discharge Date  03/27/19      Recumbant Bike   Level  2    Watts  20    Minutes  15    METs  2.6      NuStep   Level  3    SPM  85    Minutes  15    METs  2.5      Prescription Details   Frequency (times per week)  3    Duration  Progress to 30 minutes of continuous aerobic without signs/symptoms of physical distress      Intensity   THRR 40-80% of Max Heartrate  61-122    Ratings of Perceived Exertion  11-13      Progression   Progression  Continue to progress workloads to maintain intensity without signs/symptoms of physical distress.      Resistance Training   Training Prescription  Yes    Weight  3 lbs.     Reps  10-15       Discharge Exercise Prescription (Final Exercise Prescription Changes): Exercise Prescription Changes - 05/22/19 1401      Response to Exercise   Blood Pressure (Admit)  120/78    Blood Pressure (Exercise)  138/70     Blood Pressure (Exit)  110/60    Heart Rate (Admit)  99 bpm    Heart Rate (Exercise)  112 bpm    Heart Rate (Exit)  87 bpm    Rating of Perceived Exertion (Exercise)  13    Perceived Dyspnea (Exercise)  0    Symptoms  None    Comments  Pt's last day of exercise.     Duration  Continue with 45 min of aerobic exercise without signs/symptoms of physical distress.    Intensity  THRR unchanged      Progression   Progression  Continue to progress workloads to maintain intensity without signs/symptoms of physical distress.    Average METs  3.8      Resistance Training   Training Prescription  No      Interval Training   Interval Training  No      Bike   Minutes  30      Recumbant Bike   Level  3.7    Minutes  15    METs  4.5      NuStep   Level  6  SPM  95    Minutes  15    METs  3.1      Home Exercise Plan   Plans to continue exercise at  Home (comment)   Walking, stationary bike   Frequency  Add 4 additional days to program exercise sessions.    Initial Home Exercises Provided  02/25/19       Functional Capacity: 6 Minute Walk    Row Name 01/29/19 1455 05/20/19 1636       6 Minute Walk   Phase  Initial  Discharge    Distance  1364 feet  1567 feet    Distance % Change  --  14.88 %    Distance Feet Change  --  203 ft    Walk Time  6 minutes  6 minutes    # of Rest Breaks  0  0    MPH  2.6  2.97    METS  2.58  3.04    RPE  12  13    Perceived Dyspnea   0  0    VO2 Peak  9.03  10.6    Symptoms  No  Yes (comment)    Comments  --  Bilateral Foot Pain 6/10    Resting HR  73 bpm  78 bpm    Resting BP  104/60  110/72    Resting Oxygen Saturation   98 %  --    Exercise Oxygen Saturation  during 6 min walk  99 %  --    Max Ex. HR  96 bpm  99 bpm    Max Ex. BP  122/72  120/70    2 Minute Post BP  114/70  96/68       Psychological, QOL, Others - Outcomes: PHQ 2/9: Depression screen PHQ 2/9 01/29/2019  Decreased Interest 0  Down, Depressed, Hopeless 1  PHQ  - 2 Score 1    Quality of Life: Quality of Life - 05/20/19 1629      Quality of Life Scores   Health/Function Pre  19.3 %    Health/Function Post  24.3 %    Health/Function % Change  25.91 %    Socioeconomic Pre  24.36 %    Socioeconomic Post  24.86 %    Socioeconomic % Change   2.05 %    Psych/Spiritual Pre  21.21 %    Psych/Spiritual Post  22 %    Psych/Spiritual % Change  3.72 %    Family Pre  18 %    Family Post  20.25 %    Family % Change  12.5 %    GLOBAL Pre  20.62 %    GLOBAL Post  23.44 %    GLOBAL % Change  13.68 %       Personal Goals: Goals established at orientation with interventions provided to work toward goal. Personal Goals and Risk Factors at Admission - 01/29/19 1601      Core Components/Risk Factors/Patient Goals on Admission    Weight Management  Yes;Obesity;Weight Loss;Weight Maintenance    Intervention  Weight Management: Provide education and appropriate resources to help participant work on and attain dietary goals.;Weight Management/Obesity: Establish reasonable short term and long term weight goals.;Obesity: Provide education and appropriate resources to help participant work on and attain dietary goals.    Expected Outcomes  Short Term: Continue to assess and modify interventions until short term weight is achieved;Long Term: Adherence to nutrition and physical activity/exercise program aimed toward attainment of established  weight goal;Weight Loss: Understanding of general recommendations for a balanced deficit meal plan, which promotes 1-2 lb weight loss per week and includes a negative energy balance of [phone] kcal/d;Understanding recommendations for meals to include 15-35% energy as protein, 25-35% energy from fat, 35-60% energy from carbohydrates, less than 236m of dietary cholesterol, 20-35 gm of total fiber daily;Understanding of distribution of calorie intake throughout the day with the consumption of 4-5 meals/snacks;Weight Gain: Understanding  of general recommendations for a high calorie, high protein meal plan that promotes weight gain by distributing calorie intake throughout the day with the consumption for 4-5 meals, snacks, and/or supplements    Hypertension  Yes    Intervention  Provide education on lifestyle modifcations including regular physical activity/exercise, weight management, moderate sodium restriction and increased consumption of fresh fruit, vegetables, and low fat dairy, alcohol moderation, and smoking cessation.;Monitor prescription use compliance.    Expected Outcomes  Short Term: Continued assessment and intervention until BP is < 140/9100mHG in hypertensive participants. < 130/804mG in hypertensive participants with diabetes, heart failure or chronic kidney disease.;Long Term: Maintenance of blood pressure at goal levels.    Lipids  Yes    Intervention  Provide education and support for participant on nutrition & aerobic/resistive exercise along with prescribed medications to achieve LDL <4m81mDL >40mg84m Expected Outcomes  Short Term: Participant states understanding of desired cholesterol values and is compliant with medications prescribed. Participant is following exercise prescription and nutrition guidelines.;Long Term: Cholesterol controlled with medications as prescribed, with individualized exercise RX and with personalized nutrition plan. Value goals: LDL < 4mg,47m > 40 mg.    Stress  Yes    Intervention  Refer participants experiencing significant psychosocial distress to appropriate mental health specialists for further evaluation and treatment. When possible, include family members and significant others in education/counseling sessions.    Expected Outcomes  Short Term: Participant demonstrates changes in health-related behavior, relaxation and other stress management skills, ability to obtain effective social support, and compliance with psychotropic medications if prescribed.;Long Term: Emotional  wellbeing is indicated by absence of clinically significant psychosocial distress or social isolation.        Personal Goals Discharge: Goals and Risk Factor Review    Row Name 02/10/19 0814 10630/20 1439 03/23/19 0805 04/27/19 1139 05/25/19 1601     Core Components/Risk Factors/Patient Goals Review   Personal Goals Review  Weight Management/Obesity;Lipids;Stress;Hypertension  Weight Management/Obesity;Lipids;Stress;Hypertension  Weight Management/Obesity;Lipids;Stress;Hypertension  Weight Management/Obesity;Lipids;Stress;Hypertension  Weight Management/Obesity;Lipids;Stress;Hypertension   Review  Patient with multiple CAD risk factors. She is very eager to participate in CR to decrease her risk factors and to learn lifestyle modifications.  Patient with multiple CAD risk factors. She is very eager to continue to participate in CR to decrease her risk factors and to learn lifestyle modifications.  Patient with multiple CAD risk factors. She is very eager to continue exercise and lifestyle modifications to decrease riskfactors.  Patient with multiple CAD risk factors.  Continues to participate in cardiac rehab plan of care/exercise independently at home.  Pamela Lowe graduated CR with 24 completed sessions.  She enjoyed participating in CR and felt that she improved greatly.  She was able to increase her workloads and walking distance as well as lost weight with her participation in CR.   Expected Outcomes  Patient will continue to participate in CR.  Patient will continue to participate in CR.  Patient will continue to participate in exercise and lifestyle modifications while in-person CR is suspended secondary to  Covid-19 pandemic.  Patient will continue to participate in cardiac rehab for risk factor modification  Patient will continue to participate in exercise, nutrition, and lifestyle modification opportunities to reduce her risk of CV disease.  She plans to ride her bike for exercise.      Exercise  Goals and Review: Exercise Goals    Row Name 01/29/19 1502             Exercise Goals   Increase Physical Activity  Yes       Intervention  Provide advice, education, support and counseling about physical activity/exercise needs.;Develop an individualized exercise prescription for aerobic and resistive training based on initial evaluation findings, risk stratification, comorbidities and participant's personal goals.       Expected Outcomes  Short Term: Attend rehab on a regular basis to increase amount of physical activity.;Long Term: Add in home exercise to make exercise part of routine and to increase amount of physical activity.;Long Term: Exercising regularly at least 3-5 days a week.       Increase Strength and Stamina  Yes       Intervention  Provide advice, education, support and counseling about physical activity/exercise needs.;Develop an individualized exercise prescription for aerobic and resistive training based on initial evaluation findings, risk stratification, comorbidities and participant's personal goals.       Expected Outcomes  Short Term: Increase workloads from initial exercise prescription for resistance, speed, and METs.;Short Term: Perform resistance training exercises routinely during rehab and add in resistance training at home;Long Term: Improve cardiorespiratory fitness, muscular endurance and strength as measured by increased METs and functional capacity (6MWT)       Able to understand and use rate of perceived exertion (RPE) scale  Yes       Intervention  Provide education and explanation on how to use RPE scale       Expected Outcomes  Short Term: Able to use RPE daily in rehab to express subjective intensity level;Long Term:  Able to use RPE to guide intensity level when exercising independently       Knowledge and understanding of Target Heart Rate Range (THRR)  Yes       Intervention  Provide education and explanation of THRR including how the numbers were  predicted and where they are located for reference       Expected Outcomes  Short Term: Able to state/look up THRR;Long Term: Able to use THRR to govern intensity when exercising independently;Short Term: Able to use daily as guideline for intensity in rehab       Able to check pulse independently  Yes       Intervention  Provide education and demonstration on how to check pulse in carotid and radial arteries.;Review the importance of being able to check your own pulse for safety during independent exercise       Expected Outcomes  Short Term: Able to explain why pulse checking is important during independent exercise;Long Term: Able to check pulse independently and accurately       Understanding of Exercise Prescription  Yes       Intervention  Provide education, explanation, and written materials on patient's individual exercise prescription       Expected Outcomes  Short Term: Able to explain program exercise prescription;Long Term: Able to explain home exercise prescription to exercise independently          Exercise Goals Re-Evaluation: Exercise Goals Re-Evaluation    Row Name 02/09/19 1621 02/25/19 1540 03/18/19 1550 04/23/19 9024  05/25/19 1406     Exercise Goal Re-Evaluation   Exercise Goals Review  Increase Physical Activity;Able to understand and use rate of perceived exertion (RPE) scale  Increase Physical Activity;Able to understand and use rate of perceived exertion (RPE) scale;Increase Strength and Stamina;Knowledge and understanding of Target Heart Rate Range (THRR);Understanding of Exercise Prescription  Increase Physical Activity;Able to understand and use rate of perceived exertion (RPE) scale;Increase Strength and Stamina;Knowledge and understanding of Target Heart Rate Range (THRR);Understanding of Exercise Prescription  Increase Physical Activity;Able to understand and use rate of perceived exertion (RPE) scale;Increase Strength and Stamina;Knowledge and understanding of Target  Heart Rate Range (THRR);Understanding of Exercise Prescription  Increase Physical Activity;Able to understand and use rate of perceived exertion (RPE) scale;Increase Strength and Stamina;Knowledge and understanding of Target Heart Rate Range (THRR);Understanding of Exercise Prescription   Comments  Patient able to understand and use RPE scale appropriately.  Reviewed home exercise guidelines with patient including endpoints, temperature precautions, target heart rate and rate of perceived exertion. Pt is walking or riding her stationary bike 5 days/week as her mode of home exercise. Pt also has an elliptical machine but hasn't resumed using it yet. Pt voices understanding of instructions given. Pt states that her breathing is much better now than before she started the program. Pt also states that she's able to walk carrying her dog upstairs without having to stop halfway to catch her breath.  Patient is riding her stationary bike at home, and is progressing well with exercise. Patient states that cardaic reahb has helped her, and she sees improvement since starting the program. Pt also states that she is less short of breath now. Pt would like to resume exercise in the onsite cardiac rehab program once the program resumes.  Patient has been actively participating in the virtual cardiac rehab program via the [employer] app and has been progressing well. Patient riding her stationary bike 30-50 minutes, 4-7 days/week.  Pt completed 24 sessions of Cardiac Rehab. Pt graduated from program and has made great progress. Pt increased functional capacity by 14.88%. Pt increased post 6MWT distance by 238f. Pt also decreased weight from 231lbs to 220lbs throughout the course of rehab.   Expected Outcomes  Increase workloads as tolerated to help improve cardiorespiratory fitness and achieve personal health and fitness goals.  Continue to progress workloads as tolerated to help improve strength and stamina.  Patient will  continue exercise ridign her staitonary bike to maintain health and fitness gains.  Patient will transition back to the onsite cardiac rehab program  Pt will continue to exercise most days of the week 4-6 days a week for 30-45 minutes. Pt will walk and use stationary bike for exercise. Pt also has 3lbs and 4lbs handweights. One of pt's goals was to improve her stamina and sob. Pt states she feels she has accomplished that goal and more.      Nutrition & Weight - Outcomes: Pre Biometrics - 01/29/19 1503      Pre Biometrics   Height  5' 7.25" (1.708 m)    Weight  105.1 kg    Waist Circumference  46.5 inches    Hip Circumference  49.5 inches    Waist to Hip Ratio  0.94 %    BMI (Calculated)  36.03    Triceps Skinfold  45 mm    % Body Fat  49.4 %    Grip Strength  38 kg    Flexibility  19.75 in    Single Leg Stand  10 seconds      Post Biometrics - 05/20/19 1637       Post  Biometrics   Height  5' 7.25" (1.708 m)    Weight  100.3 kg    Waist Circumference  42 inches    Hip Circumference  46 inches    Waist to Hip Ratio  0.91 %    BMI (Calculated)  34.38    Triceps Skinfold  43 mm    % Body Fat  46.6 %    Grip Strength  33 kg    Flexibility  20 in    Single Leg Stand  19.62 seconds       Nutrition: Nutrition Therapy & Goals - 02/19/19 0828      Nutrition Therapy   Diet  heart Healthy    Drug/Food Interactions  Statins/Certain Fruits      Personal Nutrition Goals   Nutrition Goal  Pt to identify food quantities necessary to achieve weight loss of 6-24 lb at graduation from cardiac rehab.    Personal Goal #2  Pt to identify and limit food sources of saturated fat, trans fat, refined carbohydrates and sodium      Intervention Plan   Intervention  Prescribe, educate and counsel regarding individualized specific dietary modifications aiming towards targeted core components such as weight, hypertension, lipid management, diabetes, heart failure and other  comorbidities.;Nutrition handout(s) given to patient.    Expected Outcomes  Short Term Goal: Understand basic principles of dietary content, such as calories, fat, sodium, cholesterol and nutrients.;Short Term Goal: A plan has been developed with personal nutrition goals set during dietitian appointment.;Long Term Goal: Adherence to prescribed nutrition plan.       Nutrition Discharge: Nutrition Assessments - 02/19/19 0831      MEDFICTS Scores   Pre Score  26       Education Questionnaire Score: Knowledge Questionnaire Score - 05/18/19 1608      Knowledge Questionnaire Score   Post Score  24/24       Goals reviewed with patient; copy given to patient.

## 2019-05-25 NOTE — Telephone Encounter (Signed)
Patient notified of BMD results as written by provider and agreeable to plan of care. Okay to close encounter.

## 2019-05-26 NOTE — Telephone Encounter (Signed)
Needs PUS and ca-125 and urine cytology in March if possible since last done in September.  If normal, then should repeat in 6 months again until can proceed.  Thanks.

## 2019-05-27 DIAGNOSIS — L0889 Other specified local infections of the skin and subcutaneous tissue: Secondary | ICD-10-CM | POA: Diagnosis not present

## 2019-05-27 DIAGNOSIS — L309 Dermatitis, unspecified: Secondary | ICD-10-CM | POA: Diagnosis not present

## 2019-05-27 DIAGNOSIS — L738 Other specified follicular disorders: Secondary | ICD-10-CM | POA: Diagnosis not present

## 2019-05-27 NOTE — Telephone Encounter (Signed)
Left message to call Randie Bloodgood at [phone]. 

## 2019-05-27 NOTE — Telephone Encounter (Signed)
Patient is returning call to Kaitlyn.  

## 2019-05-27 NOTE — Telephone Encounter (Signed)
Left message to call Pamela Lowe at [phone]. 

## 2019-05-29 NOTE — Telephone Encounter (Signed)
Patient returned call. Dr. Tamala Julian does not want her to come off blood thinners until October.

## 2019-05-29 NOTE — Telephone Encounter (Signed)
Spoke with patient. PUS scheduled for 06/04/2019 at 1:30 pm with 2 pm consult with Dr.Miller. Patient is aware she will also need CA 125 and urine cytology. Order for PUS placed for precert.  Cc: Magdalene Patricia  Routing to provider and will close encounter.

## 2019-06-01 DIAGNOSIS — H4042X1 Glaucoma secondary to eye inflammation, left eye, mild stage: Secondary | ICD-10-CM | POA: Diagnosis not present

## 2019-06-01 DIAGNOSIS — H209 Unspecified iridocyclitis: Secondary | ICD-10-CM | POA: Diagnosis not present

## 2019-06-01 DIAGNOSIS — H4041X3 Glaucoma secondary to eye inflammation, right eye, severe stage: Secondary | ICD-10-CM | POA: Diagnosis not present

## 2019-06-02 DIAGNOSIS — M0589 Other rheumatoid arthritis with rheumatoid factor of multiple sites: Secondary | ICD-10-CM | POA: Diagnosis not present

## 2019-06-03 ENCOUNTER — Telehealth: Payer: Self-pay | Admitting: Obstetrics & Gynecology

## 2019-06-03 NOTE — Telephone Encounter (Signed)
Call placed to convey benefits.

## 2019-06-03 NOTE — Telephone Encounter (Signed)
Patient returned my call and I conveyed the benefits. Patient understands/agreeable with the benefits. Patient aware of the cancellation policy. Appointment scheduled 06/04/19.

## 2019-06-04 ENCOUNTER — Ambulatory Visit (INDEPENDENT_AMBULATORY_CARE_PROVIDER_SITE_OTHER): Payer: Medicare Other | Admitting: Obstetrics & Gynecology

## 2019-06-04 ENCOUNTER — Other Ambulatory Visit: Payer: Self-pay

## 2019-06-04 ENCOUNTER — Encounter: Payer: Self-pay | Admitting: Obstetrics & Gynecology

## 2019-06-04 ENCOUNTER — Ambulatory Visit (INDEPENDENT_AMBULATORY_CARE_PROVIDER_SITE_OTHER): Payer: Medicare Other

## 2019-06-04 ENCOUNTER — Other Ambulatory Visit (HOSPITAL_COMMUNITY)
Admission: RE | Admit: 2019-06-04 | Discharge: 2019-06-04 | Disposition: A | Discharge status: Home / Self Care | Payer: Medicare Other | Source: Ambulatory Visit | Attending: Obstetrics & Gynecology | Admitting: Obstetrics & Gynecology

## 2019-06-04 VITALS — BP 120/72 | HR 68 | Temp 97.0°F | Resp 10 | Ht 67.0 in | Wt 216.6 lb

## 2019-06-04 DIAGNOSIS — R8289 Other abnormal findings on cytological and histological examination of urine: Secondary | ICD-10-CM | POA: Diagnosis not present

## 2019-06-04 DIAGNOSIS — Z1589 Genetic susceptibility to other disease: Secondary | ICD-10-CM | POA: Diagnosis not present

## 2019-06-04 DIAGNOSIS — Z803 Family history of malignant neoplasm of breast: Secondary | ICD-10-CM

## 2019-06-04 DIAGNOSIS — I25118 Atherosclerotic heart disease of native coronary artery with other forms of angina pectoris: Secondary | ICD-10-CM

## 2019-06-04 DIAGNOSIS — Z9189 Other specified personal risk factors, not elsewhere classified: Secondary | ICD-10-CM | POA: Diagnosis not present

## 2019-06-04 DIAGNOSIS — R978 Other abnormal tumor markers: Secondary | ICD-10-CM

## 2019-06-04 NOTE — Progress Notes (Signed)
69 y.o. G1P0010 Married Pamela Lowe American female here for pelvic ultrasound due to h/o lynch syndrome with MLH1 gene mutation.  She needs PUS and ca-125 levels ever 6 months until ready to proceed with BSO.  Pt had stents placed and is on Plavix.  She is not cleared from cardiology standpoint for at least one year after procedure for elective surgery.  She has additional hx that we discussed today and questions about family members' testing and mammogram recommendations.  We actually spoke to her sister on the phone today as her younger sister is the person who keeps up the most with her significant family hx of cancers.  Sister is pretty sure she has the same gene mutation but will confirm and let Pamela Lowe know this so I can add to my family hx records as well.  Patient's last menstrual period was 03/19/1998.  Contraception: supracervical hysterectomy  Findings:  UTERUS: surgically absent, cuff normal EMS: n/a ADNEXA: Left ovary: 2.6 x 1.3 x 1.2cm       Right ovary: 2.7 x 1.7 x 1.4cm.  Ovaries atrophic CUL DE SAC: no free fluid  Discussion:  Sonographer reviewed.  Images in Epic reviewed with sonographer prior to discussion with pt as there is an image that appears to show a very small uterus.  Sonographer mislabeled this and it is the cervix which is very long.  Pt and I reviewed recommendations for evaluation until can proceed with surgery.  Ca-125 will be obtained today as well as urine cytology.  Urine cytology recommended yearly.  Pt states she is ready for surgery when cleared by cardiology.  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 Pt does not want me to do a pelvic exam today and will return if this is needed.   After reviewing images and discussion normal appearance of ovaries, additional 20  minutes spent with pt in face to face discussion of recommendations, need for full cardiology clearance prior to surgery, and additional family hx questions.

## 2019-06-05 ENCOUNTER — Other Ambulatory Visit: Payer: Self-pay | Admitting: Rheumatology

## 2019-06-05 DIAGNOSIS — R7989 Other specified abnormal findings of blood chemistry: Secondary | ICD-10-CM

## 2019-06-05 LAB — CA 125: Cancer Antigen (CA) 125: 6 U/mL (ref 0.0–38.1)

## 2019-06-08 ENCOUNTER — Ambulatory Visit (INDEPENDENT_AMBULATORY_CARE_PROVIDER_SITE_OTHER): Payer: Medicare Other | Admitting: Endocrinology

## 2019-06-08 ENCOUNTER — Other Ambulatory Visit: Payer: Self-pay

## 2019-06-08 ENCOUNTER — Encounter: Payer: Self-pay | Admitting: Endocrinology

## 2019-06-08 VITALS — BP 120/76 | HR 71 | Ht 67.0 in | Wt 221.4 lb

## 2019-06-08 DIAGNOSIS — I25118 Atherosclerotic heart disease of native coronary artery with other forms of angina pectoris: Secondary | ICD-10-CM

## 2019-06-08 DIAGNOSIS — E049 Nontoxic goiter, unspecified: Secondary | ICD-10-CM | POA: Diagnosis not present

## 2019-06-08 LAB — CYTOLOGY - NON PAP

## 2019-06-08 LAB — TSH: TSH: 0.43 u[IU]/mL (ref 0.35–4.50)

## 2019-06-08 LAB — T4, FREE: Free T4: 0.81 ng/dL (ref 0.60–1.60)

## 2019-06-08 NOTE — Progress Notes (Signed)
Subjective:    Patient ID: Pamela Lowe, female    DOB: 1951/03/02, 69 y.o.   MRN: [id number]  HPI Pt returns for f/u of mild hyperthyroidism (she was dx'ed with MNG in 2000, when she lived in [place]; she does not need bx, due to borderline low TSH; nuc med (2020) warm nodule at inferior pole LEFT thyroid lobe corresponding to a 1.4 cm diameter nodule on prior US).  She does not notice the goiter.  pt states she feels well in general.   Past Medical History:  Diagnosis Date  . Allergic rhinitis   . Breast cyst 2007   Right  . Cataract    surgery 07/2017  . Depression 2006   w/ menopause  . Family history of breast cancer   . Family history of colon cancer   . Family history of kidney cancer 01/15/2018  . Family history of lung cancer   . Family history of pancreatic cancer   . Family history of prostate cancer   . Family history of uterine cancer   . GERD (gastroesophageal reflux disease)   . Glaucoma   . History of hysterectomy, supracervical   . Hyperlipidemia   . Hypertension   . 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   MLH1 [id number] (Splice site)  . Multinodular goiter   . Tobacco user     Past Surgical History:  Procedure Laterality Date  . BREAST BIOPSY Bilateral 1985  . CARDIAC CATHETERIZATION    . CATARACT EXTRACTION Right 07/2017  . COLONOSCOPY    . CORONARY STENT INTERVENTION N/A 12/26/2018   Procedure: CORONARY STENT INTERVENTION;  Surgeon: Belva Crome, MD;  Location: [facility];  Service: Cardiovascular;  Laterality: N/A;  . LEFT HEART CATH AND CORONARY ANGIOGRAPHY N/A 12/17/2018   Procedure: LEFT HEART CATH AND CORONARY ANGIOGRAPHY;  Surgeon: Belva Crome, MD;  Location: [facility];  Service: Cardiovascular;  Laterality: N/A;  . SUPRACERVICAL ABDOMINAL HYSTERECTOMY  1986   h/o supracervical hysterectomy  . UPPER GASTROINTESTINAL ENDOSCOPY      Social History   Socioeconomic History  . Marital status: Married    Spouse name: [name]    . Number of children: 0  . Years of education: Not on file  . Highest education level: Master's degree (e.g., MA, MS, MEng, MEd, MSW, MBA)  Occupational History  . Occupation: Special Ed Teacher    Comment: Engineer, manufacturing  Tobacco Use  . Smoking status: Former Smoker    Packs/day: 1.50    Years: 39.00    Pack years: 58.50    Types: Cigarettes    Quit date: 08/01/2017    Years since quitting: 1.8  . Smokeless tobacco: Never Used  . Tobacco comment: using nicoderm patches  Substance and Sexual Activity  . Alcohol use: Not Currently  . Drug use: No  . Sexual activity: Yes    Partners: Male    Birth control/protection: Surgical, Post-menopausal    Comment: Hysterectomy  Other Topics Concern  . Not on file  Social History Narrative  . Not on file   Social Determinants of Health   Financial Resource Strain: Low Risk   . Difficulty of Paying Living Expenses: Not hard at all  Food Insecurity: No Food Insecurity  . Worried About Charity fundraiser in the Last Year: Never true  . Ran Out of Food in the Last Year: Never true  Transportation Needs: No Transportation Needs  . Lack of Transportation (Medical): No  . Lack of  Transportation (Non-Medical): No  Physical Activity: Inactive  . Days of Exercise per Week: 0 days  . Minutes of Exercise per Session: 0 min  Stress: Stress Concern Present  . Feeling of Stress : Rather much  Social Connections:   . Frequency of Communication with Friends and Family:   . Frequency of Social Gatherings with Friends and Family:   . Attends Religious Services:   . Active Member of Clubs or Organizations:   . Attends Archivist Meetings:   [name] Marital Status:   Intimate Partner Violence:   . Fear of Current or Ex-Partner:   . Emotionally Abused:   [name] Physically Abused:   . Sexually Abused:     Current Outpatient Medications on File Prior to Visit  Medication Sig Dispense Refill  . aspirin (ASPIRIN 81) 81 MG EC tablet Take 81 mg  by mouth daily.     . benazepril (LOTENSIN) 20 MG tablet Take 1 tablet (20 mg total) by mouth daily. 90 tablet 3  . brimonidine (ALPHAGAN) 0.2 % ophthalmic solution Place 1 drop into both eyes 3 (three) times daily.     . Calcium Carb-Cholecalciferol (CALCIUM-VITAMIN D) 600-400 MG-UNIT TABS Take 1 tablet by mouth daily.    . cetirizine (ZYRTEC) 10 MG tablet Take 10 mg by mouth daily.    . clopidogrel (PLAVIX) 75 MG tablet Take 1 tablet (75 mg total) by mouth daily. 90 tablet 3  . dorzolamide (TRUSOPT) 2 % ophthalmic solution Place 1 drop into both eyes 2 (two) times daily.     [name] doxycycline (MONODOX) 100 MG capsule     . ezetimibe (ZETIA) 10 MG tablet Take 1 tablet (10 mg total) by mouth daily. 90 tablet 3  . folic acid (FOLVITE) 1 MG tablet Take 1 mg by mouth daily.     . Golimumab (SIMPONI ARIA IV) Inject 12.5 mLs into the vein every 8 (eight) weeks.     . hydrochlorothiazide (MICROZIDE) 12.5 MG capsule Take 1 capsule (12.5 mg total) by mouth every Monday, Wednesday, and Friday. 45 capsule 3  . methotrexate (RHEUMATREX) 2.5 MG tablet Take 20 mg by mouth once a week. Mondays    . metoprolol succinate (TOPROL-XL) 25 MG 24 hr tablet Take 1 tablet (25 mg total) by mouth daily. 90 tablet 3  . Multiple Vitamins-Minerals (MULTIVITAMIN WITH MINERALS) tablet Take 1 tablet by mouth daily.    . Omega-3 Fatty Acids (FISH OIL) 1000 MG CAPS Take by mouth daily.     . pantoprazole (PROTONIX) 40 MG tablet Take 1 tablet (40 mg total) by mouth daily. 90 tablet 3  . prednisoLONE acetate (PRED FORTE) 1 % ophthalmic suspension Place 1 drop into both eyes every other day.     . predniSONE (DELTASONE) 5 MG tablet Take 5 mg by mouth daily.    . nitroGLYCERIN (NITROSTAT) 0.4 MG SL tablet Place 1 tablet (0.4 mg total) under the tongue every 5 (five) minutes as needed for chest pain. 25 tablet 3   No current facility-administered medications on file prior to visit.    Allergies  Allergen Reactions  . Bupropion Rash     (Wellbutrin)  . Sulfamethoxazole-Trimethoprim Other (See Comments) and Rash    Chills/achy Flu like symptoms   . Wellbutrin [Bupropion Hcl] Rash    rash    Family History  Problem Relation Age of Onset  . Kidney cancer Father 83  . Pneumonia Father   . Hypertension Father   . Hypertension Mother   . Deep  vein thrombosis Mother   . Alzheimer's disease Mother   . Osteopenia Mother   . Heart attack Maternal Grandfather   . Breast cancer Sister 104       recurrence 59  . Allergies Brother   . Breast cancer Sister 74       Stage 0  . Breast cancer Sister 70       Stage 3, bilateral mastectomy  . Heart attack Brother 80  . Deep vein thrombosis Sister           . Pancreatic cancer Paternal Aunt 25  . Breast cancer Maternal Aunt 67  . Prostate cancer Maternal Uncle 68  . Pancreatic cancer Paternal Uncle 79  . Uterine cancer Paternal Grandmother 49  . Colon cancer Paternal Grandfather 67  . Pancreatic cancer Paternal Aunt 9  . Lung cancer Paternal Uncle 60  . Brain cancer Paternal Aunt   . Kidney cancer Cousin 72  . Kidney cancer Cousin 82  . Uterine cancer Cousin 24  . Prostate cancer Cousin 30  . Lung cancer Maternal Uncle 45  . Breast cancer Cousin 83  . Thyroid disease Neg Hx     BP 120/76   Pulse 71   Ht 5' 7"  (1.702 m)   Wt 221 lb 6.4 oz (100.4 kg)   LMP 03/19/1998 Comment: h/o supracervical hysterectomy  SpO2 95%   BMI 34.68 kg/m    Review of Systems Denies neck pain.      Objective:   Physical Exam VITAL SIGNS:  See vs page GENERAL: no distress NECK: Thyroid is slightly and diffusely enlarged  No thyroid nodule is palpable.  No palpable lymphadenopathy at the anterior neck.     Lab Results  Component Value Date   TSH 0.43 06/08/2019   T4TOTAL 7.8 09/21/2016      Assessment & Plan:  Nodule goiter: due for recheck.  Hyperthyroidism: borderline now, but she is at risk for worsening.   Patient Instructions  Let's recheck the ultrasound.  you  will receive a phone call, about a day and time for an appointment. Blood tests are requested for you today.  We'll let you know about the results.  most of the time, a "lumpy thyroid" will eventually become overactive.  this is usually a slow process, happening over the span of many years.  Please come back for a follow-up appointment in 1 year.

## 2019-06-08 NOTE — Patient Instructions (Addendum)
Let's recheck the ultrasound.  you will receive a phone call, about a day and time for an appointment. Blood tests are requested for you today.  We'll let you know about the results.  most of the time, a "lumpy thyroid" will eventually become overactive.  this is usually a slow process, happening over the span of many years.  Please come back for a follow-up appointment in 1 year.

## 2019-06-09 DIAGNOSIS — K219 Gastro-esophageal reflux disease without esophagitis: Secondary | ICD-10-CM | POA: Diagnosis not present

## 2019-06-09 DIAGNOSIS — G4700 Insomnia, unspecified: Secondary | ICD-10-CM | POA: Diagnosis not present

## 2019-06-09 DIAGNOSIS — E7800 Pure hypercholesterolemia, unspecified: Secondary | ICD-10-CM | POA: Diagnosis not present

## 2019-06-09 DIAGNOSIS — E042 Nontoxic multinodular goiter: Secondary | ICD-10-CM | POA: Diagnosis not present

## 2019-06-09 DIAGNOSIS — H209 Unspecified iridocyclitis: Secondary | ICD-10-CM | POA: Diagnosis not present

## 2019-06-09 DIAGNOSIS — R768 Other specified abnormal immunological findings in serum: Secondary | ICD-10-CM | POA: Diagnosis not present

## 2019-06-09 DIAGNOSIS — I10 Essential (primary) hypertension: Secondary | ICD-10-CM | POA: Diagnosis not present

## 2019-06-09 DIAGNOSIS — Z1159 Encounter for screening for other viral diseases: Secondary | ICD-10-CM | POA: Diagnosis not present

## 2019-06-09 DIAGNOSIS — Z1509 Genetic susceptibility to other malignant neoplasm: Secondary | ICD-10-CM | POA: Diagnosis not present

## 2019-06-09 DIAGNOSIS — Z0000 Encounter for general adult medical examination without abnormal findings: Secondary | ICD-10-CM | POA: Diagnosis not present

## 2019-06-12 ENCOUNTER — Other Ambulatory Visit: Payer: Medicare Other

## 2019-06-15 ENCOUNTER — Telehealth: Payer: Self-pay

## 2019-06-15 ENCOUNTER — Ambulatory Visit
Admission: RE | Admit: 2019-06-15 | Discharge: 2019-06-15 | Disposition: A | Discharge status: Home / Self Care | Payer: Medicare Other | Source: Ambulatory Visit | Attending: Endocrinology | Admitting: Endocrinology

## 2019-06-15 ENCOUNTER — Ambulatory Visit
Admission: RE | Admit: 2019-06-15 | Discharge: 2019-06-15 | Disposition: A | Discharge status: Home / Self Care | Payer: Medicare Other | Source: Ambulatory Visit | Attending: Rheumatology | Admitting: Rheumatology

## 2019-06-15 DIAGNOSIS — E041 Nontoxic single thyroid nodule: Secondary | ICD-10-CM | POA: Diagnosis not present

## 2019-06-15 DIAGNOSIS — R7989 Other specified abnormal findings of blood chemistry: Secondary | ICD-10-CM

## 2019-06-15 DIAGNOSIS — E049 Nontoxic goiter, unspecified: Secondary | ICD-10-CM

## 2019-06-15 NOTE — Telephone Encounter (Signed)
Inflammation is very non specific. Unless she is having symptoms of a UTI, I wouldn't recommend further evaluation.  CC: Dr [name]

## 2019-06-15 NOTE — Telephone Encounter (Signed)
Call made to patient to inform her of results. She saw that her Urine cytology report says inflammation in urin patient has more questions regarding inflammation.

## 2019-06-15 NOTE — Telephone Encounter (Signed)
-----   Message from Megan Salon, MD sent at 06/12/2019  6:31 PM EDT ----- Please let pt know her know her urine cytology was negative.  I did speak with Dr. Denman George, gyn/onc, who did not recommend a repeat pap smear.  Order for ultrasound in six month has already been placed.  I will see her then.

## 2019-06-16 NOTE — Telephone Encounter (Signed)
Patient notified. She denies any UTI Symptoms. Will call if that changes.

## 2019-06-25 DIAGNOSIS — M0589 Other rheumatoid arthritis with rheumatoid factor of multiple sites: Secondary | ICD-10-CM | POA: Diagnosis not present

## 2019-06-29 DIAGNOSIS — H40043 Steroid responder, bilateral: Secondary | ICD-10-CM | POA: Insufficient documentation

## 2019-06-29 DIAGNOSIS — Z961 Presence of intraocular lens: Secondary | ICD-10-CM | POA: Insufficient documentation

## 2019-06-29 DIAGNOSIS — H4041X3 Glaucoma secondary to eye inflammation, right eye, severe stage: Secondary | ICD-10-CM | POA: Diagnosis not present

## 2019-06-29 DIAGNOSIS — H209 Unspecified iridocyclitis: Secondary | ICD-10-CM | POA: Diagnosis not present

## 2019-06-29 DIAGNOSIS — H4042X2 Glaucoma secondary to eye inflammation, left eye, moderate stage: Secondary | ICD-10-CM | POA: Diagnosis not present

## 2019-06-30 DIAGNOSIS — L738 Other specified follicular disorders: Secondary | ICD-10-CM | POA: Diagnosis not present

## 2019-06-30 DIAGNOSIS — L309 Dermatitis, unspecified: Secondary | ICD-10-CM | POA: Diagnosis not present

## 2019-07-16 DIAGNOSIS — H401133 Primary open-angle glaucoma, bilateral, severe stage: Secondary | ICD-10-CM | POA: Diagnosis not present

## 2019-07-16 DIAGNOSIS — H20023 Recurrent acute iridocyclitis, bilateral: Secondary | ICD-10-CM | POA: Diagnosis not present

## 2019-07-27 DIAGNOSIS — R7989 Other specified abnormal findings of blood chemistry: Secondary | ICD-10-CM | POA: Diagnosis not present

## 2019-08-02 ENCOUNTER — Encounter (HOSPITAL_COMMUNITY): Payer: Self-pay

## 2019-08-02 ENCOUNTER — Emergency Department (HOSPITAL_COMMUNITY)
Admission: EM | Admit: 2019-08-02 | Discharge: 2019-08-03 | Disposition: A | Discharge status: Home / Self Care | Payer: Medicare Other | Attending: Emergency Medicine | Admitting: Emergency Medicine

## 2019-08-02 ENCOUNTER — Other Ambulatory Visit: Payer: Self-pay

## 2019-08-02 ENCOUNTER — Emergency Department (HOSPITAL_COMMUNITY): Payer: Medicare Other

## 2019-08-02 DIAGNOSIS — Z79899 Other long term (current) drug therapy: Secondary | ICD-10-CM | POA: Insufficient documentation

## 2019-08-02 DIAGNOSIS — Z87891 Personal history of nicotine dependence: Secondary | ICD-10-CM | POA: Insufficient documentation

## 2019-08-02 DIAGNOSIS — I25758 Atherosclerosis of native coronary artery of transplanted heart with other forms of angina pectoris: Secondary | ICD-10-CM | POA: Diagnosis not present

## 2019-08-02 DIAGNOSIS — R079 Chest pain, unspecified: Secondary | ICD-10-CM

## 2019-08-02 DIAGNOSIS — R0789 Other chest pain: Secondary | ICD-10-CM | POA: Insufficient documentation

## 2019-08-02 DIAGNOSIS — Z7982 Long term (current) use of aspirin: Secondary | ICD-10-CM | POA: Insufficient documentation

## 2019-08-02 DIAGNOSIS — R200 Anesthesia of skin: Secondary | ICD-10-CM | POA: Diagnosis not present

## 2019-08-02 DIAGNOSIS — I10 Essential (primary) hypertension: Secondary | ICD-10-CM | POA: Diagnosis not present

## 2019-08-02 DIAGNOSIS — R519 Headache, unspecified: Secondary | ICD-10-CM | POA: Diagnosis not present

## 2019-08-02 LAB — CBC
HCT: 39 % (ref 36.0–46.0)
Hemoglobin: 12.1 g/dL (ref 12.0–15.0)
MCH: 30 pg (ref 26.0–34.0)
MCHC: 31 g/dL (ref 30.0–36.0)
MCV: 96.8 fL (ref 80.0–100.0)
Platelets: 271 10*3/uL (ref 150–400)
RBC: 4.03 MIL/uL (ref 3.87–5.11)
RDW: 14.2 % (ref 11.5–15.5)
WBC: 7.5 10*3/uL (ref 4.0–10.5)
nRBC: 0 % (ref 0.0–0.2)

## 2019-08-02 LAB — BASIC METABOLIC PANEL
Anion gap: 7 (ref 5–15)
BUN: 20 mg/dL (ref 8–23)
CO2: 26 mmol/L (ref 22–32)
Calcium: 9.7 mg/dL (ref 8.9–10.3)
Chloride: 106 mmol/L (ref 98–111)
Creatinine, Ser: 1.16 mg/dL — ABNORMAL HIGH (ref 0.44–1.00)
GFR calc Af Amer: 56 mL/min — ABNORMAL LOW (ref >60)
GFR calc non Af Amer: 48 mL/min — ABNORMAL LOW (ref >60)
Glucose, Bld: 107 mg/dL — ABNORMAL HIGH (ref 70–99)
Potassium: 4.3 mmol/L (ref 3.5–5.1)
Sodium: 139 mmol/L (ref 135–145)

## 2019-08-02 LAB — TROPONIN I (HIGH SENSITIVITY): Troponin I (High Sensitivity): 3 ng/L (ref <18)

## 2019-08-02 MED ORDER — SODIUM CHLORIDE 0.9% FLUSH: 3.0000 mL | Freq: Once | INTRAVENOUS | Status: DC

## 2019-08-02 NOTE — ED Triage Notes (Signed)
Pt arrived POV ambulatory into ED from home CC left arm/hand numbness X 2-3 days. Pt also reports HA and nose bleed with bright red blood. Pt reports chest tightness and pt took at home Nitro .4mg  X2 at 2105 2120 without relief.   Hx HTN, and stents placed October 2020

## 2019-08-02 NOTE — ED Provider Notes (Signed)
[facility] DEPT Provider Note   CSN: [id number] Arrival date & time: 08/02/19  2140     History Chief Complaint  Patient presents with  . Chest Pain  . Left arm numbness    Pamela Lowe is a 69 y.o. female.  Patient presents to the emergency department with a chief complaint of chest pressure.  She states that this evening she noticed some central chest pressure.  She took her blood pressure and noted it to be 409W systolic.  She states that this is unusually high for her.  She took 2 doses of sublingual nitroglycerin with improvement of her chest pressure and improvement of her blood pressure.  Additionally, patient states that she has been having a headache for the past couple of days.  She reports having some epistaxis when she blows her nose.  She is anticoagulated on Xarelto.  She also reports having had some left hand numbness and tingling which is intermittent.  She initially attributed the symptoms to her rheumatoid arthritis.  She denies any injuries.  Denies any weakness.  She states that currently she is still having some slight chest pressure, but states that it does not hurt, but is just uncomfortable.  She denies feeling short of breath.  She does have history of cardiac catheterization with stenting.  The history is provided by the patient. No language interpreter was used.       Past Medical History:  Diagnosis Date  . Allergic rhinitis   . Breast cyst 2007   Right  . Cataract    surgery 07/2017  . Depression 2006   w/ menopause  . Family history of breast cancer   . Family history of colon cancer   . Family history of kidney cancer 01/15/2018  . Family history of lung cancer   . Family history of pancreatic cancer   . Family history of prostate cancer   . Family history of uterine cancer   . GERD (gastroesophageal reflux disease)   . Glaucoma   . History of hysterectomy, supracervical   . Hyperlipidemia   . Hypertension     . 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   MLH1 [id number] (Splice site)  . Multinodular goiter   . Tobacco user     Patient Active Problem List   Diagnosis Date Noted  . S/P coronary artery stent placement   . PAD (peripheral artery disease) ([place])   . CAD (coronary artery disease) 12/26/2018  . Angina pectoris ([place])   . Primary open-angle glaucoma 10/28/2018  . 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 Aortic atherosclerosis ([place]) 08/19/2018  . Uveitis 05/05/2018  . Goiter 05/05/2018  . Mixed hyperlipidemia 05/05/2018  . 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  . Genetic testing 02/07/2018  . Family history of kidney cancer 01/15/2018  . Family history of pancreatic cancer   . Family history of breast cancer   . Family history of uterine cancer   . Family history of colon cancer   . Family history of prostate cancer   . Family history of lung cancer   . Hypertensive retinopathy of both eyes 11/12/2017  . Iritis of right eye 11/12/2017  . Nuclear sclerotic cataract of left eye 11/12/2017  . Ocular hypertension of right eye 11/12/2017  . Pseudophakia, right eye 11/12/2017  . Inflammation of eye, right 08/07/2017  . Memory loss 09/21/2016  . Essential hypertension 09/21/2016  . Low vitamin D level 09/21/2016  . DOE (  dyspnea on exertion) 11/08/2010    Past Surgical History:  Procedure Laterality Date  . BREAST BIOPSY Bilateral 1985  . CARDIAC CATHETERIZATION    . CATARACT EXTRACTION Right 07/2017  . COLONOSCOPY    . CORONARY STENT INTERVENTION N/A 12/26/2018   Procedure: CORONARY STENT INTERVENTION;  Surgeon: Belva Crome, MD;  Location: [facility];  Service: Cardiovascular;  Laterality: N/A;  . LEFT HEART CATH AND CORONARY ANGIOGRAPHY N/A 12/17/2018   Procedure: LEFT HEART CATH AND CORONARY ANGIOGRAPHY;  Surgeon: Belva Crome, MD;  Location: [facility];  Service: Cardiovascular;  Laterality: N/A;   . SUPRACERVICAL ABDOMINAL HYSTERECTOMY  1986   h/o supracervical hysterectomy  . UPPER GASTROINTESTINAL ENDOSCOPY       OB History    Gravida  1   Para  0   Term      Preterm      AB  1   Living  0     SAB  1   TAB      Ectopic      Multiple      Live Births              Family History  Problem Relation Age of Onset  . Kidney cancer Father 70  . Pneumonia Father   . Hypertension Father   . Hypertension Mother   . Deep vein thrombosis Mother   . Alzheimer's disease Mother   . Osteopenia Mother   . Heart attack Maternal Grandfather   . Breast cancer Sister 82       recurrence 55  . Allergies Brother   . Breast cancer Sister 20       Stage 0  . Breast cancer Sister 67       Stage 3, bilateral mastectomy  . Heart attack Brother 17  . Deep vein thrombosis Sister           . Pancreatic cancer Paternal Aunt 62  . Breast cancer Maternal Aunt 67  . Prostate cancer Maternal Uncle 68  . Pancreatic cancer Paternal Uncle 58  . Uterine cancer Paternal Grandmother 33  . Colon cancer Paternal Grandfather 21  . Pancreatic cancer Paternal Aunt 104  . Lung cancer Paternal Uncle 76  . Brain cancer Paternal Aunt   . Kidney cancer Cousin 45  . Kidney cancer Cousin 84  . Uterine cancer Cousin 85  . Prostate cancer Cousin 71  . Lung cancer Maternal Uncle 28  . Breast cancer Cousin 89  . Thyroid disease Neg Hx     Social History   Tobacco Use  . Smoking status: Former Smoker    Packs/day: 1.50    Years: 39.00    Pack years: 58.50    Types: Cigarettes    Quit date: 08/01/2017    Years since quitting: 2.0  . Smokeless tobacco: Never Used  . Tobacco comment: using nicoderm patches  Substance Use Topics  . Alcohol use: Not Currently  . Drug use: No    Home Medications Prior to Admission medications   Medication Sig Start Date End Date Taking? Authorizing Provider  aspirin (ASPIRIN 81) 81 MG EC tablet Take 81 mg by mouth daily.     [provider]  benazepril (LOTENSIN) 20 MG tablet Take 1 tablet (20 mg total) by mouth daily. 01/08/19   Belva Crome, MD  brimonidine (ALPHAGAN) 0.2 % ophthalmic solution Place 1 drop into both eyes 3 (three) times daily.  11/20/18   [provider]  Calcium Carb-Cholecalciferol (CALCIUM-VITAMIN D) 600-400 MG-UNIT TABS Take 1 tablet by mouth daily.    [provider]  cetirizine (ZYRTEC) 10 MG tablet Take 10 mg by mouth daily.    [provider]  clopidogrel (PLAVIX) 75 MG tablet Take 1 tablet (75 mg total) by mouth daily. 12/19/18   Belva Crome, MD  dorzolamide (TRUSOPT) 2 % ophthalmic solution Place 1 drop into both eyes 2 (two) times daily.  10/02/18   [provider]  doxycycline (MONODOX) 100 MG capsule  06/01/19   [provider]  ezetimibe (ZETIA) 10 MG tablet Take 1 tablet (10 mg total) by mouth daily. 03/24/19 03/23/20  Belva Crome, MD  folic acid (FOLVITE) 1 MG tablet Take 1 mg by mouth daily.     [provider]  Golimumab (SIMPONI ARIA IV) Inject 12.5 mLs into the vein every 8 (eight) weeks.     [provider]  hydrochlorothiazide (MICROZIDE) 12.5 MG capsule Take 1 capsule (12.5 mg total) by mouth every Monday, Wednesday, and Friday. 02/23/19   Belva Crome, MD  methotrexate (RHEUMATREX) 2.5 MG tablet Take 20 mg by mouth once a week. Mondays    [provider]  metoprolol succinate (TOPROL-XL) 25 MG 24 hr tablet Take 1 tablet (25 mg total) by mouth daily. 08/21/18   Belva Crome, MD  Multiple Vitamins-Minerals (MULTIVITAMIN WITH MINERALS) tablet Take 1 tablet by mouth daily.    [provider]  nitroGLYCERIN (NITROSTAT) 0.4 MG SL tablet Place 1 tablet (0.4 mg total) under the tongue every 5 (five) minutes as needed for chest pain. 08/21/18 03/19/19  Belva Crome, MD  Omega-3 Fatty Acids (FISH OIL) 1000 MG CAPS Take by mouth daily.     [provider]  pantoprazole (PROTONIX) 40 MG tablet Take 1 tablet (40 mg  total) by mouth daily. 12/19/18   Belva Crome, MD  prednisoLONE acetate (PRED FORTE) 1 % ophthalmic suspension Place 1 drop into both eyes every other day.  08/07/17   [provider]  predniSONE (DELTASONE) 5 MG tablet Take 5 mg by mouth daily. 11/15/18   [provider]    Allergies    Bupropion, Sulfamethoxazole-trimethoprim, and Wellbutrin [bupropion hcl]  Review of Systems   Review of Systems  All other systems reviewed and are negative.   Physical Exam Updated Vital Signs BP (!) 150/97   Pulse (!) 56   Temp 98.8 F (37.1 C) (Oral)   Resp 18   Ht '5\' 7"'$  (1.702 m)   Wt 98.4 kg   LMP 03/19/1998 Comment: h/o supracervical hysterectomy  SpO2 100%   BMI 33.99 kg/m   Physical Exam Vitals and nursing note reviewed.  Constitutional:      General: She is not in acute distress.    Appearance: She is well-developed.  HENT:     Head: Normocephalic and atraumatic.  Eyes:     Conjunctiva/sclera: Conjunctivae normal.  Cardiovascular:     Rate and Rhythm: Normal rate and regular rhythm.     Heart sounds: No murmur.  Pulmonary:     Effort: Pulmonary effort is normal. No respiratory distress.     Breath sounds: Normal breath sounds.  Abdominal:     Palpations: Abdomen is soft.     Tenderness: There is no abdominal tenderness.  Musculoskeletal:        General: Normal range of motion.     Cervical back: Neck supple.  Skin:    General: Skin  is warm and dry.  Neurological:     Mental Status: She is alert and oriented to person, place, and time.     Comments: CN III-XII intact, speech is clear, movements are goal oriented, sensation and strength intact throughout  Psychiatric:        Mood and Affect: Mood normal.        Behavior: Behavior normal.     ED Results / Procedures / Treatments   Labs (all labs ordered are listed, but only abnormal results are displayed) Labs Reviewed  BASIC METABOLIC PANEL - Abnormal; Notable for the following components:       Result Value   Glucose, Bld 107 (*)    Creatinine, Ser 1.16 (*)    GFR calc non Af Amer 48 (*)    GFR calc Af Amer 56 (*)    All other components within normal limits  CBC  TROPONIN I (HIGH SENSITIVITY)  TROPONIN I (HIGH SENSITIVITY)    EKG EKG Interpretation  Date/Time:  Sunday Aug 02 2019 21:49:37 EDT Ventricular Rate:  51 PR Interval:    QRS Duration: 98 QT Interval:  422 QTC Calculation: 389 R Axis:   -42 Text Interpretation: Sinus rhythm Left anterior fascicular block Low voltage, precordial leads Abnormal R-wave progression, early transition Consider anterior infarct 12 Lead; Mason-Likar No significant change since last tracing Confirmed by Orpah Greek [phone]) on 08/02/2019 11:51:38 PM   Radiology DG Chest 2 View  Result Date: 08/02/2019 CLINICAL DATA:  Chest pain EXAM: CHEST - 2 VIEW COMPARISON:  12/26/2015 FINDINGS: The heart size and mediastinal contours are within normal limits. Aortic atherosclerosis. Both lungs are clear. The visualized skeletal structures are unremarkable. IMPRESSION: No active cardiopulmonary disease. Electronically Signed   By: Donavan Foil M.D.   On: 08/02/2019 22:09    Procedures Procedures (including critical care time)  Medications Ordered in ED Medications  sodium chloride flush (NS) 0.9 % injection 3 mL (0 mLs Intravenous Hold 08/02/19 2215)    ED Course  I have reviewed the triage vital signs and the nursing notes.  Pertinent labs & imaging results that were available during my care of the patient were reviewed by me and considered in my medical decision making (see chart for details).    MDM Rules/Calculators/A&P                      This patient complains of elevated BP and chest presure, this involves an extensive number of treatment options, and is a complaint that carries with it a high risk of complications and morbidity.  The differential diagnosis includes ACS, infection, MSK, anxiety.  Pertinent Labs I  ordered, reviewed, and interpreted labs, which included CBC and BMP are reassuring.  Trop negative x2.  HEART score is 5, but her chest pressure came on after she checked her BP and found it to be elevated and it made her nervous.  There seems to be more of an anxiety component to the pain than an ACS type presentation.  Imaging Interpretation I ordered imaging studies which included CXR.  I independently visualized and interpreted the CXR, which showed no acute process.  CT head ordered for left hand tingling.  CT negative for acute findings. There is no weakness.  I doubt CVA or TIA.  Reassessments After the interventions stated above, I reevaluated the patient and found improved and reassured.  Patient seen by and discussed with Dr. Betsey Holiday.  Patient will follow-up closely with her doctor.  Final Clinical Impression(s) / ED Diagnoses Final diagnoses:  Nonspecific chest pain    Rx / DC Orders ED Discharge Orders    None       Montine Circle, PA-C 08/03/19 [place], [place], DO 08/03/19 1601

## 2019-08-03 DIAGNOSIS — H209 Unspecified iridocyclitis: Secondary | ICD-10-CM | POA: Diagnosis not present

## 2019-08-03 DIAGNOSIS — Z6835 Body mass index (BMI) 35.0-35.9, adult: Secondary | ICD-10-CM | POA: Diagnosis not present

## 2019-08-03 DIAGNOSIS — R7989 Other specified abnormal findings of blood chemistry: Secondary | ICD-10-CM | POA: Diagnosis not present

## 2019-08-03 DIAGNOSIS — M0589 Other rheumatoid arthritis with rheumatoid factor of multiple sites: Secondary | ICD-10-CM | POA: Diagnosis not present

## 2019-08-03 DIAGNOSIS — M2550 Pain in unspecified joint: Secondary | ICD-10-CM | POA: Diagnosis not present

## 2019-08-03 DIAGNOSIS — E669 Obesity, unspecified: Secondary | ICD-10-CM | POA: Diagnosis not present

## 2019-08-03 DIAGNOSIS — Z79899 Other long term (current) drug therapy: Secondary | ICD-10-CM | POA: Diagnosis not present

## 2019-08-03 LAB — TROPONIN I (HIGH SENSITIVITY): Troponin I (High Sensitivity): 3 ng/L (ref <18)

## 2019-08-03 NOTE — Discharge Instructions (Signed)
Your lab tests and imaging were all reassuring tonight.  We recommend that you follow-up closely with your doctor.  Please return for new or worsening symptoms.

## 2019-08-10 ENCOUNTER — Other Ambulatory Visit: Payer: Self-pay | Admitting: Interventional Cardiology

## 2019-08-10 DIAGNOSIS — J302 Other seasonal allergic rhinitis: Secondary | ICD-10-CM | POA: Diagnosis not present

## 2019-08-10 DIAGNOSIS — R040 Epistaxis: Secondary | ICD-10-CM | POA: Diagnosis not present

## 2019-08-19 DIAGNOSIS — H20023 Recurrent acute iridocyclitis, bilateral: Secondary | ICD-10-CM | POA: Diagnosis not present

## 2019-08-19 DIAGNOSIS — H524 Presbyopia: Secondary | ICD-10-CM | POA: Diagnosis not present

## 2019-08-19 DIAGNOSIS — H401133 Primary open-angle glaucoma, bilateral, severe stage: Secondary | ICD-10-CM | POA: Diagnosis not present

## 2019-08-20 DIAGNOSIS — R7989 Other specified abnormal findings of blood chemistry: Secondary | ICD-10-CM | POA: Diagnosis not present

## 2019-08-20 DIAGNOSIS — M0589 Other rheumatoid arthritis with rheumatoid factor of multiple sites: Secondary | ICD-10-CM | POA: Diagnosis not present

## 2019-08-21 DIAGNOSIS — E7800 Pure hypercholesterolemia, unspecified: Secondary | ICD-10-CM | POA: Diagnosis not present

## 2019-08-21 DIAGNOSIS — R9389 Abnormal findings on diagnostic imaging of other specified body structures: Secondary | ICD-10-CM | POA: Diagnosis not present

## 2019-08-21 DIAGNOSIS — R7309 Other abnormal glucose: Secondary | ICD-10-CM | POA: Diagnosis not present

## 2019-08-21 DIAGNOSIS — N1831 Chronic kidney disease, stage 3a: Secondary | ICD-10-CM | POA: Diagnosis not present

## 2019-08-26 ENCOUNTER — Other Ambulatory Visit: Payer: Self-pay | Admitting: *Deleted

## 2019-08-26 DIAGNOSIS — R748 Abnormal levels of other serum enzymes: Secondary | ICD-10-CM

## 2019-08-28 DIAGNOSIS — L84 Corns and callosities: Secondary | ICD-10-CM | POA: Diagnosis not present

## 2019-08-28 DIAGNOSIS — L853 Xerosis cutis: Secondary | ICD-10-CM | POA: Diagnosis not present

## 2019-09-03 DIAGNOSIS — L738 Other specified follicular disorders: Secondary | ICD-10-CM | POA: Diagnosis not present

## 2019-09-03 DIAGNOSIS — L309 Dermatitis, unspecified: Secondary | ICD-10-CM | POA: Diagnosis not present

## 2019-09-16 NOTE — Progress Notes (Signed)
[place]   [phone] [phone]   Clinic Follow up Note   Patient Care Team: [name], PA-C as PCP - General (Family Medicine) Belva Crome, MD as PCP - Cardiology (Cardiology) [name], MD as Referring Physician (Physical Medicine and Rehabilitation) [name], MD as Consulting Physician (Hematology)  Date of Service:  09/23/2019  CHIEF COMPLAINT: Lynch Syndrome, MHL1  Mutation positive   CURRENT THERAPY:  Surveillance   INTERVAL HISTORY:  Pamela Lowe is here for a follow up of Lynch Syndrome. She was last seen by me in 03/2018. She presents to the clinic alone. She notes she is doing well. She denies any pain. She notes she is still managing uveitis and RA. She has not been able to proceed with screening colonoscopy because she was unable to come off her blood thinner. Once Dr Tamala Julian allow her to hold blood thinner she plans to proceed with colonoscopy for 2021. She notes due to liver function she was stopped Crestor and held cholesterol medications and her methotrexate dose was reduced to 4 tablets. She plans to have US abdomen to look at her liver due to LFTs, last in 05/2019.     REVIEW OF SYSTEMS:   Constitutional: Denies fevers, chills or abnormal weight loss Eyes: Denies blurriness of vision Ears, nose, mouth, throat, and face: Denies mucositis or sore throat Respiratory: Denies cough, dyspnea or wheezes Cardiovascular: Denies palpitation, chest discomfort or lower extremity swelling Gastrointestinal:  Denies nausea, heartburn or change in bowel habits Skin: Denies abnormal skin rashes Lymphatics: Denies new lymphadenopathy or easy bruising Neurological:Denies numbness, tingling or new weaknesses Behavioral/Psych: Mood is stable, no new changes  All other systems were reviewed with the patient and are negative.  MEDICAL HISTORY:  Past Medical History:  Diagnosis Date   Allergic rhinitis    Breast cyst 2007    Right   Cataract    surgery 07/2017   Depression 2006   w/ menopause   Family history of breast cancer    Family history of colon cancer    Family history of kidney cancer 01/15/2018   Family history of lung cancer    Family history of pancreatic cancer    Family history of prostate cancer    Family history of uterine cancer    GERD (gastroesophageal reflux disease)    Glaucoma    History of hysterectomy, supracervical    Hyperlipidemia    Hypertension    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   MLH1 [id number] (Splice site)   Multinodular goiter    Tobacco user     SURGICAL HISTORY: Past Surgical History:  Procedure Laterality Date   BREAST BIOPSY Bilateral 1985   CARDIAC CATHETERIZATION     CATARACT EXTRACTION Right 07/2017   COLONOSCOPY     CORONARY STENT INTERVENTION N/A 12/26/2018   Procedure: CORONARY STENT INTERVENTION;  Surgeon: Belva Crome, MD;  Location: [facility];  Service: Cardiovascular;  Laterality: N/A;   LEFT HEART CATH AND CORONARY ANGIOGRAPHY N/A 12/17/2018   Procedure: LEFT HEART CATH AND CORONARY ANGIOGRAPHY;  Surgeon: Belva Crome, MD;  Location: [facility];  Service: Cardiovascular;  Laterality: N/A;   SUPRACERVICAL ABDOMINAL HYSTERECTOMY  1986   h/o supracervical hysterectomy   UPPER GASTROINTESTINAL ENDOSCOPY      I have reviewed the social history and family history with the patient and they are unchanged from previous note.  ALLERGIES:  is allergic to bupropion, sulfamethoxazole-trimethoprim, and wellbutrin [bupropion hcl].  MEDICATIONS:  Current Outpatient Medications  Medication Sig Dispense Refill   aspirin (ASPIRIN 81) 81 MG EC tablet Take 81 mg by mouth every evening.      benazepril (LOTENSIN) 20 MG tablet Take 1 tablet (20 mg total) by mouth daily. 90 tablet 3   betamethasone dipropionate (DIPROLENE) 0.05 % ointment Apply topically.     brimonidine (ALPHAGAN) 0.2 % ophthalmic solution  Place 1 drop into both eyes 3 (three) times daily.      Calcium Carb-Cholecalciferol (CALCIUM-VITAMIN D) 600-400 MG-UNIT TABS Take 1 tablet by mouth daily.     cetirizine (ZYRTEC) 10 MG tablet Take 10 mg by mouth daily.     clindamycin (CLEOCIN T) 1 % lotion Apply 1 application topically daily.     clopidogrel (PLAVIX) 75 MG tablet Take 1 tablet (75 mg total) by mouth daily. 90 tablet 3   dorzolamide (TRUSOPT) 2 % ophthalmic solution Place 1 drop into both eyes 2 (two) times daily.      dorzolamide-timolol (COSOPT) 22.3-6.8 MG/ML ophthalmic solution Place 1 drop into the right eye 2 (two) times daily.     doxycycline (MONODOX) 100 MG capsule Take 100 mg by mouth daily.      ezetimibe (ZETIA) 10 MG tablet Take 1 tablet (10 mg total) by mouth daily. 90 tablet 3   folic acid (FOLVITE) 1 MG tablet Take 1 mg by mouth daily.      hydrochlorothiazide (MICROZIDE) 12.5 MG capsule Take 1 capsule (12.5 mg total) by mouth every Monday, Wednesday, and Friday. 45 capsule 3   levocetirizine (XYZAL) 5 MG tablet Take 5 mg by mouth daily.     methotrexate (RHEUMATREX) 2.5 MG tablet Take 10 mg by mouth once a week. Mondays Take 4 tablets (10 mg)     metoprolol succinate (TOPROL-XL) 25 MG 24 hr tablet Take 1 tablet by mouth once daily 90 tablet 3   Multiple Vitamins-Minerals (MULTIVITAMIN WITH MINERALS) tablet Take 1 tablet by mouth daily.     nitroGLYCERIN (NITROSTAT) 0.4 MG SL tablet Place 1 tablet (0.4 mg total) under the tongue every 5 (five) minutes as needed for chest pain. 25 tablet 3   nitroGLYCERIN (NITROSTAT) 0.4 MG SL tablet Place 0.4 mg under the tongue every 5 (five) minutes as needed for chest pain.     Omega-3 Fatty Acids (FISH OIL) 1000 MG CAPS Take 1 capsule by mouth daily.      pantoprazole (PROTONIX) 40 MG tablet Take 1 tablet (40 mg total) by mouth daily. 90 tablet 3   predniSONE (DELTASONE) 1 MG tablet Take 4 mg by mouth daily.     predniSONE (DELTASONE) 5 MG tablet Take 5 mg  by mouth daily.     rosuvastatin (CRESTOR) 40 MG tablet TAKE 1 TABLET BY MOUTH ONCE DAILY (DOSE  CHANGE) 90 tablet 3   SIMPONI ARIA 50 MG/4ML SOLN injection Inject 50 mg into the vein. Every 60 Days     No current facility-administered medications for this visit.    PHYSICAL EXAMINATION: ECOG PERFORMANCE STATUS: 0 - Asymptomatic  Vitals:   09/23/19 1320  BP: 134/82  Pulse: 68  Resp: 18  Temp: 97.9 F (36.6 C)  SpO2: 99%   Filed Weights   09/23/19 1320  Weight: 220 lb 8 oz (100 kg)    GENERAL:alert, no distress and comfortable SKIN: skin color, texture, turgor are normal, no rashes or significant lesions EYES: normal, Conjunctiva are pink and non-injected, sclera clear  NECK: supple, thyroid normal size, non-tender, without nodularity LYMPH:  no  palpable lymphadenopathy in the cervical, axillary  LUNGS: clear to auscultation and percussion with normal breathing effort HEART: regular rate & rhythm and no murmurs and no lower extremity edema ABDOMEN:abdomen soft, non-tender and normal bowel sounds Musculoskeletal:no cyanosis of digits and no clubbing  NEURO: alert & oriented x 3 with fluent speech, no focal motor/sensory deficits  LABORATORY DATA:  I have reviewed the data as listed CBC Latest Ref Rng & Units 08/02/2019 12/27/2018 12/26/2018  WBC 4.0 - 10.5 K/uL 7.5 6.9 8.0  Hemoglobin 12.0 - 15.0 g/dL 12.1 11.0(L) 12.4  Hematocrit 36 - 46 % 39.0 33.4(L) 37.1  Platelets 150 - 400 K/uL 271 261 275     CMP Latest Ref Rng & Units 08/02/2019 12/27/2018 12/26/2018  Glucose 70 - 99 mg/dL 107(H) 98 -  BUN 8 - 23 mg/dL 20 9 -  Creatinine 0.44 - 1.00 mg/dL 1.16(H) 0.99 0.91  Sodium 135 - 145 mmol/L 139 141 -  Potassium 3.5 - 5.1 mmol/L 4.3 3.8 -  Chloride 98 - 111 mmol/L 106 108 -  CO2 22 - 32 mmol/L 26 24 -  Calcium 8.9 - 10.3 mg/dL 9.7 9.0 -  Total Protein 6.0 - 8.5 g/dL - - -  Total Bilirubin 0.0 - 1.2 mg/dL - - -  Alkaline Phos 39 - 117 IU/L - - -  AST 0 - 40 IU/L - - -   ALT 0 - 32 IU/L - - -     Colonoscopy by Dr Ardis Hughs 05/07/18 IMPRESSION - One 2 mm polyp in the ascending colon, removed with a cold snare. Resected and retrieved. - Diverticulosis in the left colon. - The examination was otherwise normal on direct and retroflexion views.  Upper Endoscopy  IMPRESSION - Gastritis. Biopsied. - The examination was otherwise normal. Diagnosis 1. Surgical [P], ascending colon, polyp - TUBULAR ADENOMA. - NO HIGH GRADE DYSPLASIA OR MALIGNANCY. 2. Surgical [P], gastric antrum and gastric body - MILD CHRONIC GASTRITIS WITH FOCAL INTESTINAL METAPLASIA. - WARTHIN-STARRY IS NEGATIVE FOR HELICOBACTER PYLORI. - NO DYSPLASIA, OR MALIGNANCY.   US Abdomen 06/15/19  IMPRESSION: Negative exam.   DEXA 05/05/19 Normal with T-score -0.9 at right hip    RADIOGRAPHIC STUDIES: I have personally reviewed the radiological images as listed and agreed with the findings in the report. No results found.   ASSESSMENT & PLAN:  Pamela Lowe is a 69 y.o. female with    1. Lynch Syndrome, MLH1 mutation heterozygous  -Her genetic test results from 12/2017 showed MHL1 mutation, heterozygous.  -We previously discussed that this is one of the most common genetic mutations which causes Lynch syndrome and reviewed NCCN Guideline with patient. We discussed with MLH1 mutations carriers 40-80% lifetime risk of colon cancer by age of 8, 25-60% lifetime risk of endometrial cancer by age of 30, slightly increased risk of ovarian cancer (4-22%), smaller risk of gastric and pancreatic cancer at about 6%, and other extra chronic malignancy, such as hepatobiliary track, urinary tract, small bowel, brain and skin, in the range of 1-9%. -With Lynch syndrome she would need colonoscopy every 1-2 years and upper endoscopy every 2-5 years. She has had hysterectomy due to h/o of fibroids. She will proceed with oophorectomy with her gynecologist. Due to her anticoagulation, she did not have  colonoscopy since 04/2018, and plan to repeat next in 04/2020 -Given she has strong family history of pancreatic cancer (3 paternal uncles), so she qualify for pancreatic cancer screening with MRI and upper endoscopy Ultrasound for gastric and pancreatic  cancer.  -She is clinically stable and has no new concerns. She is overdue for her 2021 Colonoscopy and has not been able to proceed with BSO yet as she is waiting for Dr Tamala Julian to hold her blood thinner before she proceeds. She will continue pancreatic screening with MRI abdomen. Her 04/2019 Mammogram was normal. She is open to additional yearly breast MRIs for cancer screening.  -Labs reviewed lab results from March to June with Dr Trudie Reed. She does have mildly elevated LFTs. Physical exam unremarkable. -Continue screening, MRI breast and abdomen in 10/2019. F/u in 18 months.     2. Health maintenance: HLD, HTN, Rheumatoid Arthritis  -She will continue to f/u with PCP, cardiologist, Gyn, RA. She will continue medications.    PLAN:  -Breast and abdomen MRI in August or September for cancer screening.  -Mammogram in 04/2020 -Proceed with Colonoscopy and BSO in Early 2022 when off blood thinner -Lab and F/u in 18 months.    No problem-specific Assessment & Plan notes found for this encounter.   Orders Placed This Encounter  Procedures   MR BREAST BILATERAL W WO CONTRAST INC CAD    Standing Status:   Future    Standing Expiration Date:   09/22/2020    Order Specific Question:   If indicated for the ordered procedure, I authorize the administration of contrast media per Radiology protocol    Answer:   Yes    Order Specific Question:   What is the patient's sedation requirement?    Answer:   No Sedation    Order Specific Question:   Does the patient have a pacemaker or implanted devices?    Answer:   No    Order Specific Question:   Radiology Contrast Protocol - do NOT remove file path    Answer:   \[email or web address]      Order Specific Question:   Preferred imaging location?    Answer:   [facility] (table limit-550lbs)   MR Abdomen W Wo Contrast    Standing Status:   Future    Standing Expiration Date:   09/22/2020    Order Specific Question:   If indicated for the ordered procedure, I authorize the administration of contrast media per Radiology protocol    Answer:   Yes    Order Specific Question:   What is the patient's sedation requirement?    Answer:   No Sedation    Order Specific Question:   Does the patient have a pacemaker or implanted devices?    Answer:   No    Order Specific Question:   Radiology Contrast Protocol - do NOT remove file path    Answer:   \[email or web address]    Order Specific Question:   Preferred imaging location?    Answer:   [facility] (table limit-550lbs)   All questions were answered. The patient knows to call the clinic with any problems, questions or concerns. No barriers to learning was detected. The total time spent in the appointment was 25 minutes.     [name], MD 09/23/2019   I, Joslyn Devon, am acting as scribe for [name], MD.   I have reviewed the above documentation for accuracy and completeness, and I agree with the above.

## 2019-09-23 ENCOUNTER — Other Ambulatory Visit: Payer: Self-pay

## 2019-09-23 ENCOUNTER — Encounter: Payer: Self-pay | Admitting: Hematology

## 2019-09-23 ENCOUNTER — Inpatient Hospital Stay: Payer: Medicare Other | Attending: Hematology | Admitting: Hematology

## 2019-09-23 VITALS — BP 134/82 | HR 68 | Temp 97.9°F | Resp 18 | Ht 67.0 in | Wt 220.5 lb

## 2019-09-23 DIAGNOSIS — Z801 Family history of malignant neoplasm of trachea, bronchus and lung: Secondary | ICD-10-CM | POA: Diagnosis not present

## 2019-09-23 DIAGNOSIS — Z7952 Long term (current) use of systemic steroids: Secondary | ICD-10-CM | POA: Insufficient documentation

## 2019-09-23 DIAGNOSIS — Z8049 Family history of malignant neoplasm of other genital organs: Secondary | ICD-10-CM | POA: Insufficient documentation

## 2019-09-23 DIAGNOSIS — Z8042 Family history of malignant neoplasm of prostate: Secondary | ICD-10-CM | POA: Insufficient documentation

## 2019-09-23 DIAGNOSIS — Z803 Family history of malignant neoplasm of breast: Secondary | ICD-10-CM | POA: Diagnosis not present

## 2019-09-23 DIAGNOSIS — I25118 Atherosclerotic heart disease of native coronary artery with other forms of angina pectoris: Secondary | ICD-10-CM | POA: Diagnosis not present

## 2019-09-23 DIAGNOSIS — Z800 Family history of malignant neoplasm of digestive organs: Secondary | ICD-10-CM | POA: Insufficient documentation

## 2019-09-23 DIAGNOSIS — Z1509 Genetic susceptibility to other malignant neoplasm: Secondary | ICD-10-CM | POA: Diagnosis not present

## 2019-09-23 DIAGNOSIS — Z7982 Long term (current) use of aspirin: Secondary | ICD-10-CM | POA: Insufficient documentation

## 2019-09-23 DIAGNOSIS — Z8051 Family history of malignant neoplasm of kidney: Secondary | ICD-10-CM | POA: Diagnosis not present

## 2019-09-23 DIAGNOSIS — Z79899 Other long term (current) drug therapy: Secondary | ICD-10-CM | POA: Diagnosis not present

## 2019-09-23 DIAGNOSIS — Z8249 Family history of ischemic heart disease and other diseases of the circulatory system: Secondary | ICD-10-CM | POA: Diagnosis not present

## 2019-09-23 DIAGNOSIS — Z1239 Encounter for other screening for malignant neoplasm of breast: Secondary | ICD-10-CM | POA: Diagnosis not present

## 2019-09-23 DIAGNOSIS — Z8349 Family history of other endocrine, nutritional and metabolic diseases: Secondary | ICD-10-CM | POA: Insufficient documentation

## 2019-09-24 ENCOUNTER — Telehealth: Payer: Self-pay | Admitting: Hematology

## 2019-09-24 NOTE — Telephone Encounter (Signed)
Unable to schedule per 7/7 los. Template is not made for 2023. Will schedule pt when template is available.

## 2019-09-28 ENCOUNTER — Other Ambulatory Visit: Payer: Medicare Other | Admitting: *Deleted

## 2019-09-28 ENCOUNTER — Other Ambulatory Visit: Payer: Self-pay

## 2019-09-28 DIAGNOSIS — R748 Abnormal levels of other serum enzymes: Secondary | ICD-10-CM

## 2019-09-28 LAB — HEPATIC FUNCTION PANEL
ALT: 22 IU/L (ref 0–32)
AST: 28 IU/L (ref 0–40)
Albumin: 3.9 g/dL (ref 3.8–4.8)
Alkaline Phosphatase: 70 IU/L (ref 48–121)
Bilirubin Total: 0.3 mg/dL (ref 0.0–1.2)
Bilirubin, Direct: 0.1 mg/dL (ref 0.00–0.40)
Total Protein: 6.8 g/dL (ref 6.0–8.5)

## 2019-09-30 ENCOUNTER — Telehealth: Payer: Self-pay | Admitting: *Deleted

## 2019-09-30 MED ORDER — ROSUVASTATIN CALCIUM 10 MG PO TABS
10.0000 mg | ORAL_TABLET | Freq: Every day | ORAL | 3 refills | Status: DC
Start: 2019-09-30 — End: 2019-11-27

## 2019-09-30 NOTE — Telephone Encounter (Signed)
Spoke with pt and reviewed results and recommendations per Dr. Tamala Julian.  Pt agreeable to plan.  Pt was already scheduled for fasting labs on 9/3 and seeing Dr. Tamala Julian the following week.  Advised pt to keep these appts.  Pt appreciative for call.

## 2019-09-30 NOTE — Telephone Encounter (Signed)
-----   Message from Belva Crome, MD sent at 09/28/2019  9:54 PM EDT ----- Let the patient know the elevated liver tests are likely related to STATIN therapy. Therefore, we will try a lower dose. Rosuvastatin 10 mg daily. Liver and lipid in 6 weeks. A copy will be sent to [name], PA-C

## 2019-10-15 DIAGNOSIS — M0589 Other rheumatoid arthritis with rheumatoid factor of multiple sites: Secondary | ICD-10-CM | POA: Diagnosis not present

## 2019-10-19 DIAGNOSIS — R0981 Nasal congestion: Secondary | ICD-10-CM | POA: Diagnosis not present

## 2019-10-19 DIAGNOSIS — J309 Allergic rhinitis, unspecified: Secondary | ICD-10-CM | POA: Diagnosis not present

## 2019-10-19 DIAGNOSIS — R519 Headache, unspecified: Secondary | ICD-10-CM | POA: Diagnosis not present

## 2019-10-19 DIAGNOSIS — R21 Rash and other nonspecific skin eruption: Secondary | ICD-10-CM | POA: Diagnosis not present

## 2019-10-21 ENCOUNTER — Ambulatory Visit (HOSPITAL_COMMUNITY)
Admission: EM | Admit: 2019-10-21 | Discharge: 2019-10-21 | Disposition: A | Discharge status: Home / Self Care | Payer: Medicare Other | Attending: Physician Assistant | Admitting: Physician Assistant

## 2019-10-21 ENCOUNTER — Other Ambulatory Visit: Payer: Self-pay

## 2019-10-21 DIAGNOSIS — R109 Unspecified abdominal pain: Secondary | ICD-10-CM | POA: Diagnosis not present

## 2019-10-21 LAB — POCT URINALYSIS DIPSTICK, ED / UC
Bilirubin Urine: NEGATIVE
Glucose, UA: NEGATIVE mg/dL
Ketones, ur: NEGATIVE mg/dL
Nitrite: NEGATIVE
Protein, ur: NEGATIVE mg/dL
Specific Gravity, Urine: 1.025 (ref 1.005–1.030)
Urobilinogen, UA: 0.2 mg/dL (ref 0.0–1.0)
pH: 5.5 (ref 5.0–8.0)

## 2019-10-21 MED ORDER — DICLOFENAC SODIUM 1 % EX GEL: 4.0000 g | Freq: Four times a day (QID) | CUTANEOUS | 0 refills | Status: DC

## 2019-10-21 NOTE — Discharge Instructions (Addendum)
Apply the cream to the area of pain 4 times a day  Consider over the counter lidocaine patches for the area of pain  Continue tylenol every 6-8 hours  We have sent a urine culture and will call if we need to change treatment  Follow up with PCP if persistent

## 2019-10-21 NOTE — ED Triage Notes (Addendum)
Pt is here with right flank, back pain that started 10 days ago, pt has taken Tylenol to relieve discomfort. Pt states the Tylenol did help her pain.

## 2019-10-21 NOTE — ED Provider Notes (Signed)
[facility]    CSN: [id number] Arrival date & time: 10/21/19  1631      History   Chief Complaint Chief Complaint  Patient presents with  . Flank Pain  . Back Pain    HPI Pamela Lowe is a 69 y.o. female.   Patient presents with 10-day history of right-sided flank pain.  She describes a intermittent pain in her right side.  Certain movements cause this.  There are times that she does not notice the pain at all.  Pain has not been getting worse.  Tylenol has been working well.  Denies painful urination.  Reports baseline urgency and frequency.  Denies any blood in her urine.  Denies any vaginal symptoms.  Denies fever, chills.  Reports she feels otherwise well.  She does report she has had a somewhat of a chronic cough due to postnasal drip.     Past Medical History:  Diagnosis Date  . Allergic rhinitis   . Breast cyst 2007   Right  . Cataract    surgery 07/2017  . Depression 2006   w/ menopause  . Family history of breast cancer   . Family history of colon cancer   . Family history of kidney cancer 01/15/2018  . Family history of lung cancer   . Family history of pancreatic cancer   . Family history of prostate cancer   . Family history of uterine cancer   . GERD (gastroesophageal reflux disease)   . Glaucoma   . History of hysterectomy, supracervical   . Hyperlipidemia   . Hypertension   . 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   MLH1 [id number] (Splice site)  . Multinodular goiter   . Tobacco user     Patient Active Problem List   Diagnosis Date Noted  . S/P coronary artery stent placement   . PAD (peripheral artery disease) ([place])   . CAD (coronary artery disease) 12/26/2018  . Angina pectoris ([place])   . Primary open-angle glaucoma 10/28/2018  . 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 Aortic atherosclerosis ([place]) 08/19/2018  . Uveitis 05/05/2018  . Goiter 05/05/2018  .  Mixed hyperlipidemia 05/05/2018  . 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  . Genetic testing 02/07/2018  . Family history of kidney cancer 01/15/2018  . Family history of pancreatic cancer   . Family history of breast cancer   . Family history of uterine cancer   . Family history of colon cancer   . Family history of prostate cancer   . Family history of lung cancer   . Hypertensive retinopathy of both eyes 11/12/2017  . Iritis of right eye 11/12/2017  . Nuclear sclerotic cataract of left eye 11/12/2017  . Ocular hypertension of right eye 11/12/2017  . Pseudophakia, right eye 11/12/2017  . Inflammation of eye, right 08/07/2017  . Memory loss 09/21/2016  . Essential hypertension 09/21/2016  . Low vitamin D level 09/21/2016  . DOE (dyspnea on exertion) 11/08/2010    Past Surgical History:  Procedure Laterality Date  . BREAST BIOPSY Bilateral 1985  . CARDIAC CATHETERIZATION    . CATARACT EXTRACTION Right 07/2017  . COLONOSCOPY    . CORONARY STENT INTERVENTION N/A 12/26/2018   Procedure: CORONARY STENT INTERVENTION;  Surgeon: Belva Crome, MD;  Location: [facility];  Service: Cardiovascular;  Laterality: N/A;  . LEFT HEART CATH AND CORONARY ANGIOGRAPHY N/A 12/17/2018   Procedure: LEFT HEART CATH AND CORONARY ANGIOGRAPHY;  Surgeon: Daneen Schick  W, MD;  Location: [facility];  Service: Cardiovascular;  Laterality: N/A;  . SUPRACERVICAL ABDOMINAL HYSTERECTOMY  1986   h/o supracervical hysterectomy  . UPPER GASTROINTESTINAL ENDOSCOPY      OB History    Gravida  1   Para  0   Term      Preterm      AB  1   Living  0     SAB  1   TAB      Ectopic      Multiple      Live Births               Home Medications    Prior to Admission medications   Medication Sig Start Date End Date Taking? Authorizing Provider  aspirin (ASPIRIN 81) 81 MG EC tablet Take 81 mg by mouth every evening.     [provider]  benazepril (LOTENSIN) 20 MG tablet Take 1  tablet (20 mg total) by mouth daily. 01/08/19   Belva Crome, MD  betamethasone dipropionate (DIPROLENE) 0.05 % ointment Apply topically. 09/10/19   [provider]  brimonidine (ALPHAGAN) 0.2 % ophthalmic solution Place 1 drop into both eyes 3 (three) times daily.  11/20/18   [provider]  Calcium Carb-Cholecalciferol (CALCIUM-VITAMIN D) 600-400 MG-UNIT TABS Take 1 tablet by mouth daily.    [provider]  cetirizine (ZYRTEC) 10 MG tablet Take 10 mg by mouth daily.    [provider]  clindamycin (CLEOCIN T) 1 % lotion Apply 1 application topically daily.    [provider]  clopidogrel (PLAVIX) 75 MG tablet Take 1 tablet (75 mg total) by mouth daily. 12/19/18   Belva Crome, MD  diclofenac Sodium (VOLTAREN) 1 % GEL Apply 4 g topically 4 (four) times daily. 10/21/19   Shammond Arave, Marguerita Beards, PA-C  dorzolamide (TRUSOPT) 2 % ophthalmic solution Place 1 drop into both eyes 2 (two) times daily.  10/02/18   [provider]  dorzolamide-timolol (COSOPT) 22.3-6.8 MG/ML ophthalmic solution Place 1 drop into the right eye 2 (two) times daily. 06/29/19   [provider]  doxycycline (MONODOX) 100 MG capsule Take 100 mg by mouth daily.  06/01/19   [provider]  ezetimibe (ZETIA) 10 MG tablet Take 1 tablet (10 mg total) by mouth daily. 03/24/19 03/23/20  Belva Crome, MD  folic acid (FOLVITE) 1 MG tablet Take 1 mg by mouth daily.     [provider]  hydrochlorothiazide (MICROZIDE) 12.5 MG capsule Take 1 capsule (12.5 mg total) by mouth every Monday, Wednesday, and Friday. 02/23/19   Belva Crome, MD  levocetirizine (XYZAL) 5 MG tablet Take 5 mg by mouth daily. 09/09/19   [provider]  levocetirizine (XYZAL) 5 MG tablet Take by mouth.    [provider]  methotrexate (RHEUMATREX) 2.5 MG tablet Take 10 mg by mouth once a week. Mondays Take 4 tablets (10 mg)    [provider]  metoprolol succinate (TOPROL-XL)  25 MG 24 hr tablet Take 1 tablet by mouth once daily 08/11/19   Belva Crome, MD  Multiple Vitamins-Minerals (MULTIVITAMIN WITH MINERALS) tablet Take 1 tablet by mouth daily.    [provider]  nitroGLYCERIN (NITROSTAT) 0.4 MG SL tablet Place 1 tablet (0.4 mg total) under the tongue every 5 (five) minutes as needed for chest pain. 08/21/18 03/19/19  Belva Crome, MD  nitroGLYCERIN (NITROSTAT) 0.4 MG SL tablet Place 0.4 mg under the tongue  every 5 (five) minutes as needed for chest pain.    [provider]  Omega-3 Fatty Acids (FISH OIL) 1000 MG CAPS Take 1 capsule by mouth daily.     [provider]  pantoprazole (PROTONIX) 40 MG tablet Take 1 tablet (40 mg total) by mouth daily. 12/19/18   Smith, Henry W, MD  predniSONE (DELTASONE) 1 MG tablet Take 4 mg by mouth daily. 09/07/19   [provider]  predniSONE (DELTASONE) 5 MG tablet Take 5 mg by mouth daily. 11/15/18   [provider]  rosuvastatin (CRESTOR) 10 MG tablet Take 1 tablet (10 mg total) by mouth daily. 09/30/19 12/29/19  Smith, Henry W, MD  SIMPONI ARIA 50 MG/4ML SOLN injection Inject 50 mg into the vein. Every 60 Days 07/16/19   [provider]    Family History Family History  Problem Relation Age of Onset  . Kidney cancer Father 57  . Pneumonia Father   . Hypertension Father   . Hypertension Mother   . Deep vein thrombosis Mother   . Alzheimer's disease Mother   . Osteopenia Mother   . Heart attack Maternal Grandfather   . Breast cancer Sister 59       recurrence 61  . Allergies Brother   . Breast cancer Sister 56       Stage 0  . Breast cancer Sister 42       Stage 3, bilateral mastectomy  . Heart attack Brother 60  . Deep vein thrombosis Sister           . Pancreatic cancer Paternal Aunt 64  . Breast cancer Maternal Aunt 67  . Prostate cancer Maternal Uncle 68  . Pancreatic cancer Paternal Uncle 55  . Uterine cancer Paternal Grandmother 83  . Colon cancer  Paternal Grandfather 60  . Pancreatic cancer Paternal Aunt 65  . Lung cancer Paternal Uncle 53  . Brain cancer Paternal Aunt   . Kidney cancer Cousin 59  . Kidney cancer Cousin 52  . Uterine cancer Cousin 62  . Prostate cancer Cousin 57  . Lung cancer Maternal Uncle 60  . Breast cancer Cousin 60  . Thyroid disease Neg Hx     Social History Social History   Tobacco Use  . Smoking status: Former Smoker    Packs/day: 1.50    Years: 39.00    Pack years: 58.50    Types: Cigarettes    Quit date: 08/01/2017    Years since quitting: 2.2  . Smokeless tobacco: Never Used  . Tobacco comment: using nicoderm patches  Vaping Use  . Vaping Use: Never used  Substance Use Topics  . Alcohol use: Not Currently  . Drug use: No     Allergies   Bupropion, Sulfamethoxazole-trimethoprim, and Wellbutrin [bupropion hcl]   Review of Systems Review of Systems   Physical Exam Triage Vital Signs ED Triage Vitals  Enc Vitals Group     BP 10/21/19 1744 136/73     Pulse Rate 10/21/19 1744 60     Resp 10/21/19 1744 17     Temp 10/21/19 1744 98.3 F (36.8 C)     Temp Source 10/21/19 1744 Oral     SpO2 10/21/19 1744 100 %     Weight --      Height --      Head Circumference --      Peak Flow --      Pain Score 10/21/19 1742 3     Pain Loc --        Pain Edu? --      Excl. in [place]? --    No data found.  Updated Vital Signs BP 136/73 (BP Location: Right Arm)   Pulse 60   Temp 98.3 F (36.8 C) (Oral)   Resp 17   LMP 03/19/1998 Comment: h/o supracervical hysterectomy  SpO2 100%   Visual Acuity Right Eye Distance:   Left Eye Distance:   Bilateral Distance:    Right Eye Near:   Left Eye Near:    Bilateral Near:     Physical Exam Vitals and nursing note reviewed.  Constitutional:      General: She is not in acute distress.    Appearance: Normal appearance. She is well-developed. She is not ill-appearing or toxic-appearing.  HENT:     Head: Normocephalic and atraumatic.  Eyes:      Conjunctiva/sclera: Conjunctivae normal.  Cardiovascular:     Rate and Rhythm: Normal rate and regular rhythm.     Heart sounds: No murmur heard.   Pulmonary:     Effort: Pulmonary effort is normal. No respiratory distress.     Breath sounds: Normal breath sounds. No wheezing, rhonchi or rales.  Abdominal:     General: Bowel sounds are normal.     Palpations: Abdomen is soft.     Tenderness: There is no abdominal tenderness. There is no right CVA tenderness or left CVA tenderness.  Musculoskeletal:     Cervical back: Neck supple.     Comments: Tenderness to palpation on the right lower ribs.  No overlying rash.  Skin:    General: Skin is warm and dry.  Neurological:     General: No focal deficit present.     Mental Status: She is alert and oriented to person, place, and time.      UC Treatments / Results  Labs (all labs ordered are listed, but only abnormal results are displayed) Labs Reviewed  POCT URINALYSIS DIPSTICK, ED / UC - Abnormal; Notable for the following components:      Result Value   Hgb urine dipstick TRACE (*)    Leukocytes,Ua LARGE (*)    All other components within normal limits  URINE CULTURE    EKG   Radiology No results found.  Procedures Procedures (including critical care time)  Medications Ordered in UC Medications - No data to display  Initial Impression / Assessment and Plan / UC Course  I have reviewed the triage vital signs and the nursing notes.  Pertinent labs & imaging results that were available during my care of the patient were reviewed by me and considered in my medical decision making (see chart for details).     #Right flank pain Patient is a 69 year old with history of rheumatoid arthritis presenting with right flank pain.,  UA with blood and leukocytes, however lack of urinary symptoms at this time and reproducible nature of tenderness on exam, will culture urine before initiating any antibiotics.  Patient did state  that she has been warned about starting antibiotics as she is on certain treatments for her autoimmune diseases.  Discussed use of topical Voltaren for the area of pain and continued use of Tylenol.  Also discussed consideration of lidocaine patch.  Encouraged her to follow-up with her primary care.  Return and emergency department cautions discussed.  Patient verbalized understanding plan of care.   Final Clinical Impressions(s) / UC Diagnoses   Final diagnoses:  Right flank pain     Discharge Instructions     Apply the cream  to the area of pain 4 times a day  Consider over the counter lidocaine patches for the area of pain  Continue tylenol every 6-8 hours  We have sent a urine culture and will call if we need to change treatment  Follow up with PCP if persistent      ED Prescriptions    Medication Sig Dispense Auth. Provider   diclofenac Sodium (VOLTAREN) 1 % GEL Apply 4 g topically 4 (four) times daily. 100 g Khaila Velarde, Marguerita Beards, PA-C     PDMP not reviewed this encounter.   Purnell Shoemaker, PA-C 10/21/19 2125

## 2019-10-22 LAB — URINE CULTURE: Culture: NO GROWTH

## 2019-10-26 ENCOUNTER — Telehealth: Payer: Self-pay | Admitting: Obstetrics & Gynecology

## 2019-10-26 NOTE — Telephone Encounter (Signed)
Appointment Request From: [name]    With Provider: Megan Salon, MD [facility]]    Preferred Date Range: Any    Preferred Times: Any Time    Reason for visit: Office Visit    Comments:  Follow up with Dr. [name] from visit to [facility] on August 4.   Labs On Aug 4  POCT URINALYSIS DIPSTICK, ED / UC - Abnormal; Notable for the following components:   Result  Value  Hgb urine dipstick  TRACE (*)    Leukocytes,Ua  LARGE (*)  Culture- negative

## 2019-10-26 NOTE — Telephone Encounter (Signed)
Spoke with patient. Patient request OV for f/u of right flank pain and abnormal UA at [facility] urgent care on 8/4. Per review of Epic, final urine culture negative for bacteria. [place] trace hgb and large leukocytes.  Patient reports intermittent flank pain present for 1.5 -2wk, 3/10 on pain scale, improves with tylenol. Reports urinary frequency, denies any other symptoms. No recent injuries. Patient states she was advised to f/u with GYN for abnormal UA and flank pain.    Advised patient Dr. [name] is out of the office this week, patient agreeable to schedule with covering provider. OV scheduled for 8/10 at 1pm with Dr. Talbert Nan.   Routing to covering provider for final review. Patient is agreeable to disposition. Will close encounter.

## 2019-10-27 ENCOUNTER — Telehealth: Payer: Self-pay | Admitting: Obstetrics & Gynecology

## 2019-10-27 ENCOUNTER — Encounter: Payer: Self-pay | Admitting: Obstetrics and Gynecology

## 2019-10-27 ENCOUNTER — Ambulatory Visit (INDEPENDENT_AMBULATORY_CARE_PROVIDER_SITE_OTHER): Payer: Medicare Other | Admitting: Obstetrics and Gynecology

## 2019-10-27 ENCOUNTER — Other Ambulatory Visit: Payer: Self-pay

## 2019-10-27 VITALS — BP 122/64 | HR 73 | Ht 67.0 in | Wt 227.0 lb

## 2019-10-27 DIAGNOSIS — M549 Dorsalgia, unspecified: Secondary | ICD-10-CM | POA: Diagnosis not present

## 2019-10-27 DIAGNOSIS — I25118 Atherosclerotic heart disease of native coronary artery with other forms of angina pectoris: Secondary | ICD-10-CM | POA: Diagnosis not present

## 2019-10-27 DIAGNOSIS — N3281 Overactive bladder: Secondary | ICD-10-CM

## 2019-10-27 LAB — POCT URINALYSIS DIPSTICK
Bilirubin, UA: NEGATIVE
Blood, UA: NEGATIVE
Glucose, UA: NEGATIVE
Ketones, UA: NEGATIVE
Nitrite, UA: NEGATIVE
Protein, UA: NEGATIVE
Urobilinogen, UA: NEGATIVE E.U./dL — AB
pH, UA: 7 (ref 5.0–8.0)

## 2019-10-27 NOTE — Telephone Encounter (Signed)
Spoke with patient regarding benefits for recommended ultrasound. Patient is aware that ultrasound is transvaginal. Patient acknowledges understanding of information presented. Patient is aware of cancellation policy. Patient scheduled appointment for 12/24/2019 at 1230PM with M. Edwinna Areola, MD. Encounter closed.

## 2019-10-27 NOTE — Patient Instructions (Addendum)
Overactive Bladder, Adult  Overactive bladder refers to a condition in which a person has a sudden need to pass urine. The person may leak urine if he or she cannot get to the bathroom fast enough (urinary incontinence). A person with this condition may also wake up several times in the night to go to the bathroom. Overactive bladder is associated with poor nerve signals between your bladder and your brain. Your bladder may get the signal to empty before it is full. You may also have very sensitive muscles that make your bladder squeeze too soon. These symptoms might interfere with daily work or social activities. What are the causes? This condition may be associated with or caused by:  Urinary tract infection.  Infection of nearby tissues, such as the prostate.  Prostate enlargement.  Surgery on the uterus or urethra.  Bladder stones, inflammation, or tumors.  Drinking too much caffeine or alcohol.  Certain medicines, especially medicines that get rid of extra fluid in the body (diuretics).  Muscle or nerve weakness, especially from: ? A spinal cord injury. ? Stroke. ? Multiple sclerosis. ? Parkinson's disease.  Diabetes.  Constipation. What increases the risk? You may be at greater risk for overactive bladder if you:  Are an older adult.  Smoke.  Are going through menopause.  Have prostate problems.  Have a neurological disease, such as stroke, dementia, Parkinson's disease, or multiple sclerosis (MS).  Eat or drink things that irritate the bladder. These include alcohol, spicy food, and caffeine.  Are overweight or obese. What are the signs or symptoms? Symptoms of this condition include:  Sudden, strong urge to urinate.  Leaking urine.  Urinating 8 or more times a day.  Waking up to urinate 2 or more times a night. How is this diagnosed? Your health care provider may suspect overactive bladder based on your symptoms. He or she will diagnose this condition  by:  A physical exam and medical history.  Blood or urine tests. You might need bladder or urine tests to help determine what is causing your overactive bladder. You might also need to see a health care provider who specializes in urinary tract problems (urologist). How is this treated? Treatment for overactive bladder depends on the cause of your condition and whether it is mild or severe. You can also make lifestyle changes at home. Options include:  Bladder training. This may include: ? Learning to control the urge to urinate by following a schedule that directs you to urinate at regular intervals (timed voiding). ? Doing Kegel exercises to strengthen your pelvic floor muscles, which support your bladder. Toning these muscles can help you control urination, even if your bladder muscles are overactive.  Special devices. This may include: ? Biofeedback, which uses sensors to help you become aware of your body's signals. ? Electrical stimulation, which uses electrodes placed inside the body (implanted) or outside the body. These electrodes send gentle pulses of electricity to strengthen the nerves or muscles that control the bladder. ? Women may use a plastic device that fits into the vagina and supports the bladder (pessary).  Medicines. ? Antibiotics to treat bladder infection. ? Antispasmodics to stop the bladder from releasing urine at the wrong time. ? Tricyclic antidepressants to relax bladder muscles. ? Injections of botulinum toxin type A directly into the bladder tissue to relax bladder muscles.  Lifestyle changes. This may include: ? Weight loss. Talk to your health care provider about weight loss methods that would work best for you. ?   Diet changes. This may include reducing how much alcohol and caffeine you consume, or drinking fluids at different times of the day. ? Not smoking. Do not use any products that contain nicotine or tobacco, such as cigarettes and e-cigarettes. If  you need help quitting, ask your health care provider.  Surgery. ? A device may be implanted to help manage the nerve signals that control urination. ? An electrode may be implanted to stimulate electrical signals in the bladder. ? A procedure may be done to change the shape of the bladder. This is done only in very severe cases. Follow these instructions at home: Lifestyle  Make any diet or lifestyle changes that are recommended by your health care provider. These may include: ? Drinking less fluid or drinking fluids at different times of the day. ? Cutting down on caffeine or alcohol. ? Doing Kegel exercises. ? Losing weight if needed. ? Eating a healthy and balanced diet to prevent constipation. This may include:  Eating foods that are high in fiber, such as fresh fruits and vegetables, whole grains, and beans.  Limiting foods that are high in fat and processed sugars, such as fried and sweet foods. General instructions  Take over-the-counter and prescription medicines only as told by your health care provider.  If you were prescribed an antibiotic medicine, take it as told by your health care provider. Do not stop taking the antibiotic even if you start to feel better.  Use any implants or pessary as told by your health care provider.  If needed, wear pads to absorb urine leakage.  Keep a journal or log to track how much and when you drink and when you feel the need to urinate. This will help your health care provider monitor your condition.  Keep all follow-up visits as told by your health care provider. This is important. Contact a health care provider if:  You have a fever.  Your symptoms do not get better with treatment.  Your pain and discomfort get worse.  You have more frequent urges to urinate. Get help right away if:  You are not able to control your bladder. Summary  Overactive bladder refers to a condition in which a person has a sudden need to pass  urine.  Several conditions may lead to an overactive bladder.  Treatment for overactive bladder depends on the cause and severity of your condition.  Follow your health care provider's instructions about lifestyle changes, doing Kegel exercises, keeping a journal, and taking medicines. This information is not intended to replace advice given to you by your health care provider. Make sure you discuss any questions you have with your health care provider. Document Revised: 06/26/2018 Document Reviewed: 03/21/2017 Elsevier Patient Education  2020 [employer]. Muscle Strain A muscle strain is an injury that occurs when a muscle is stretched beyond its normal length. Usually, a small number of muscle fibers are torn when this happens. There are three types of muscle strains. First-degree strains have the least amount of muscle fiber tearing and the least amount of pain. Second-degree and third-degree strains have more tearing and pain. Usually, recovery from muscle strain takes 1-2 weeks. Complete healing normally takes 5-6 weeks. What are the causes? This condition is caused when a sudden, violent force is placed on a muscle and stretches it too far. This may occur with a fall, lifting, or sports. What increases the risk? This condition is more likely to develop in athletes and people who are physically active. What  are the signs or symptoms? Symptoms of this condition include:  Pain.  Bruising.  Swelling.  Trouble using the muscle. How is this diagnosed? This condition is diagnosed based on a physical exam and your medical history. Tests may also be done, including an X-ray, ultrasound, or MRI. How is this treated? This condition is initially treated with PRICE therapy. This therapy involves:  Protecting the muscle from being injured again.  Resting the injured muscle.  Icing the injured muscle.  Applying pressure (compression) to the injured muscle. This may be done with a  splint or elastic bandage.  Raising (elevating) the injured muscle. Your health care provider may also recommend medicine for pain. Follow these instructions at home: If you have a splint:  Wear the splint as told by your health care provider. Remove it only as told by your health care provider.  Loosen the splint if your fingers or toes tingle, become numb, or turn cold and blue.  Keep the splint clean.  If the splint is not waterproof: ? Do not let it get wet. ? Cover it with a watertight covering when you take a bath or a shower. Managing pain, stiffness, and swelling   If directed, put ice on the injured area. ? If you have a removable splint, remove it as told by your health care provider. ? Put ice in a plastic bag. ? Place a towel between your skin and the bag. ? Leave the ice on for 20 minutes, 2-3 times a day.  Move your fingers or toes often to avoid stiffness and to lessen swelling.  Raise (elevate) the injured area above the level of your heart while you are sitting or lying down.  Wear an elastic bandage as told by your health care provider. Make sure that it is not too tight. General instructions  Take over-the-counter and prescription medicines only as told by your health care provider.  Restrict your activity and rest the injured muscle as told by your health care provider. Gentle movements may be allowed.  If physical therapy was prescribed, do exercises as told by your health care provider.  Do not put pressure on any part of the splint until it is fully hardened. This may take several hours.  Do not use any products that contain nicotine or tobacco, such as cigarettes and e-cigarettes. These can delay bone healing. If you need help quitting, ask your health care provider.  Ask your health care provider when it is safe to drive if you have a splint.  Keep all follow-up visits as told by your health care provider. This is important. How is this  prevented?  Warm up before exercising. This helps to prevent future muscle strains. Contact a health care provider if:  You have more pain or swelling in the injured area. Get help right away if:  You have numbness or tingling or lose a lot of strength in the injured area. Summary  A muscle strain is an injury that occurs when a muscle is stretched beyond its normal length.  This condition is caused when a sudden, violent force is placed on a muscle and stretches it too far.  This condition is initially treated with PRICE therapy, which involves protecting, resting, icing, compressing, and elevating.  Gentle movements may be allowed. If physical therapy was prescribed, do exercises as told by your health care provider. This information is not intended to replace advice given to you by your health care provider. Make sure you discuss any  questions you have with your health care provider. Document Revised: 02/15/2017 Document Reviewed: 04/11/2016 Elsevier Patient Education  [place]. Acute Back Pain, Adult Acute back pain is sudden and usually short-lived. It is often caused by an injury to the muscles and tissues in the back. The injury may result from:  A muscle or ligament getting overstretched or torn (strained). Ligaments are tissues that connect bones to each other. Lifting something improperly can cause a back strain.  Wear and tear (degeneration) of the spinal disks. Spinal disks are circular tissue that provides cushioning between the bones of the spine (vertebrae).  Twisting motions, such as while playing sports or doing yard work.  A hit to the back.  Arthritis. You may have a physical exam, lab tests, and imaging tests to find the cause of your pain. Acute back pain usually goes away with rest and home care. Follow these instructions at home: Managing pain, stiffness, and swelling  Take over-the-counter and prescription medicines only as told by your health  care provider.  Your health care provider may recommend applying ice during the first 24-48 hours after your pain starts. To do this: ? Put ice in a plastic bag. ? Place a towel between your skin and the bag. ? Leave the ice on for 20 minutes, 2-3 times a day.  If directed, apply heat to the affected area as often as told by your health care provider. Use the heat source that your health care provider recommends, such as a moist heat pack or a heating pad. ? Place a towel between your skin and the heat source. ? Leave the heat on for 20-30 minutes. ? Remove the heat if your skin turns bright red. This is especially important if you are unable to feel pain, heat, or cold. You have a greater risk of getting burned. Activity   Do not stay in bed. Staying in bed for more than 1-2 days can delay your recovery.  Sit up and stand up straight. Avoid leaning forward when you sit, or hunching over when you stand. ? If you work at a desk, sit close to it so you do not need to lean over. Keep your chin tucked in. Keep your neck drawn back, and keep your elbows bent at a right angle. Your arms should look like the letter "L." ? Sit high and close to the steering wheel when you drive. Add lower back (lumbar) support to your car seat, if needed.  Take short walks on even surfaces as soon as you are able. Try to increase the length of time you walk each day.  Do not sit, drive, or stand in one place for more than 30 minutes at a time. Sitting or standing for long periods of time can put stress on your back.  Do not drive or use heavy machinery while taking prescription pain medicine.  Use proper lifting techniques. When you bend and lift, use positions that put less stress on your back: ? [place] your knees. ? Keep the load close to your body. ? Avoid twisting.  Exercise regularly as told by your health care provider. Exercising helps your back heal faster and helps prevent back injuries by keeping  muscles strong and flexible.  Work with a physical therapist to make a safe exercise program, as recommended by your health care provider. Do any exercises as told by your physical therapist. Lifestyle  Maintain a healthy weight. Extra weight puts stress on your back and makes it  difficult to have good posture.  Avoid activities or situations that make you feel anxious or stressed. Stress and anxiety increase muscle tension and can make back pain worse. Learn ways to manage anxiety and stress, such as through exercise. General instructions  Sleep on a firm mattress in a comfortable position. Try lying on your side with your knees slightly bent. If you lie on your back, put a pillow under your knees.  Follow your treatment plan as told by your health care provider. This may include: ? Cognitive or behavioral therapy. ? Acupuncture or massage therapy. ? Meditation or yoga. Contact a health care provider if:  You have pain that is not relieved with rest or medicine.  You have increasing pain going down into your legs or buttocks.  Your pain does not improve after 2 weeks.  You have pain at night.  You lose weight without trying.  You have a fever or chills. Get help right away if:  You develop new bowel or bladder control problems.  You have unusual weakness or numbness in your arms or legs.  You develop nausea or vomiting.  You develop abdominal pain.  You feel faint. Summary  Acute back pain is sudden and usually short-lived.  Use proper lifting techniques. When you bend and lift, use positions that put less stress on your back.  Take over-the-counter and prescription medicines and apply heat or ice as directed by your health care provider. This information is not intended to replace advice given to you by your health care provider. Make sure you discuss any questions you have with your health care provider. Document Revised: 06/24/2018 Document Reviewed:  10/17/2016 Elsevier Patient Education  [place].

## 2019-10-27 NOTE — Progress Notes (Signed)
GYNECOLOGY  VISIT   HPI: 69 y.o.   Married Black or [place] American Not Hispanic or Latino  female   [id number] with Patient's last menstrual period was 03/19/1998.   here for  Follow up from ER visit for flank pain. She says that she is no longer having pain.     She was seen in the ER on 10/21/19 with a 10 day h/o right sided flank pain. She reported baseline urgency and frequency, denied dysuria. She was noted to have tenderness on palpation of her right lower ribs, no CVA tenderness.  Urine dip with trace hgb, large leuk. The urine culture was negative.   The pain she was having is so much better. Her friend thought it might be gas and told her take activated charcoal tablets and her pain is much better. Just feels a little twinge now when she moves.   She is on a diuretic, voids frequently, small to normal amounts, has the urge to void a lot. Leaks a small with sneezing or coughing. Occasional urge incontinence, needs to change her underwear. Panty liner is enough. She has gotten used to it. Mostly feels she is empty. She drinks one cup of coffee every morning.   GYNECOLOGIC HISTORY: Patient's last menstrual period was 03/19/1998. Contraception:none  Menopausal hormone therapy: none         OB History    Gravida  1   Para  0   Term      Preterm      AB  1   Living  0     SAB  1   TAB      Ectopic      Multiple      Live Births                 Patient Active Problem List   Diagnosis Date Noted  . Pseudophakia of both eyes 06/29/2019  . Steroid responders to glaucoma of both eyes 06/29/2019  . S/P coronary artery stent placement   . PAD (peripheral artery disease) ([place])   . CAD (coronary artery disease) 12/26/2018  . Angina pectoris ([place])   . Primary open-angle glaucoma 10/28/2018  . Glaucoma associated with ocular inflammation 10/28/2018  . Uveitic glaucoma of right eye, severe stage 10/28/2018  . 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 Aortic atherosclerosis ([place]) 08/19/2018  . Uveitis 05/05/2018  . Goiter 05/05/2018  . Mixed hyperlipidemia 05/05/2018  . 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  . Genetic testing 02/07/2018  . Family history of kidney cancer 01/15/2018  . Family history of pancreatic cancer   . Family history of breast cancer   . Family history of uterine cancer   . Family history of colon cancer   . Family history of prostate cancer   . Family history of lung cancer   . Hypertensive retinopathy of both eyes 11/12/2017  . Iritis of right eye 11/12/2017  . Nuclear sclerotic cataract of left eye 11/12/2017  . Ocular hypertension of right eye 11/12/2017  . Pseudophakia, right eye 11/12/2017  . Inflammation of eye, right 08/07/2017  . Memory loss 09/21/2016  . Essential hypertension 09/21/2016  . Low vitamin D level 09/21/2016  . DOE (dyspnea on exertion) 11/08/2010    Past Medical History:  Diagnosis Date  . Allergic rhinitis   . Breast cyst 2007   Right  . Cataract    surgery 07/2017  . Depression 2006  w/ menopause  . Family history of breast cancer   . Family history of colon cancer   . Family history of kidney cancer 01/15/2018  . Family history of lung cancer   . Family history of pancreatic cancer   . Family history of prostate cancer   . Family history of uterine cancer   . GERD (gastroesophageal reflux disease)   . Glaucoma   . History of hysterectomy, supracervical   . Hyperlipidemia   . Hypertension   . 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   MLH1 [id number] (Splice site)  . Multinodular goiter   . Tobacco user     Past Surgical History:  Procedure Laterality Date  . BREAST BIOPSY Bilateral 1985  . CARDIAC CATHETERIZATION    . CATARACT EXTRACTION Right 07/2017  . COLONOSCOPY    . CORONARY STENT INTERVENTION N/A 12/26/2018   Procedure: CORONARY STENT INTERVENTION;  Surgeon: Belva Crome, MD;  Location: [facility];  Service:  Cardiovascular;  Laterality: N/A;  . LEFT HEART CATH AND CORONARY ANGIOGRAPHY N/A 12/17/2018   Procedure: LEFT HEART CATH AND CORONARY ANGIOGRAPHY;  Surgeon: Belva Crome, MD;  Location: [facility];  Service: Cardiovascular;  Laterality: N/A;  . SUPRACERVICAL ABDOMINAL HYSTERECTOMY  1986   h/o supracervical hysterectomy  . UPPER GASTROINTESTINAL ENDOSCOPY      Current Outpatient Medications  Medication Sig Dispense Refill  . aspirin (ASPIRIN 81) 81 MG EC tablet Take 81 mg by mouth every evening.     . benazepril (LOTENSIN) 20 MG tablet Take 1 tablet (20 mg total) by mouth daily. 90 tablet 3  . betamethasone dipropionate (DIPROLENE) 0.05 % ointment Apply topically.    . brimonidine (ALPHAGAN) 0.2 % ophthalmic solution Place 1 drop into both eyes 3 (three) times daily.     . Calcium Carb-Cholecalciferol (CALCIUM-VITAMIN D) 600-400 MG-UNIT TABS Take 1 tablet by mouth daily.    . clindamycin (CLEOCIN T) 1 % lotion Apply 1 application topically daily.    . clopidogrel (PLAVIX) 75 MG tablet Take 1 tablet (75 mg total) by mouth daily. 90 tablet 3  . diclofenac Sodium (VOLTAREN) 1 % GEL Apply 4 g topically 4 (four) times daily. 100 g 0  . dorzolamide (TRUSOPT) 2 % ophthalmic solution Place 1 drop into both eyes 2 (two) times daily.     . dorzolamide-timolol (COSOPT) 22.3-6.8 MG/ML ophthalmic solution Place 1 drop into the right eye 2 (two) times daily.    . folic acid (FOLVITE) 1 MG tablet Take 1 mg by mouth daily.     . hydrochlorothiazide (MICROZIDE) 12.5 MG capsule Take 1 capsule (12.5 mg total) by mouth every Monday, Wednesday, and Friday. 45 capsule 3  . methotrexate (RHEUMATREX) 2.5 MG tablet Take 10 mg by mouth once a week. Mondays Take 4 tablets (10 mg)    . metoprolol succinate (TOPROL-XL) 25 MG 24 hr tablet Take 1 tablet by mouth once daily 90 tablet 3  . Multiple Vitamins-Minerals (MULTIVITAMIN WITH MINERALS) tablet Take 1 tablet by mouth daily.    . nitroGLYCERIN (NITROSTAT) 0.4  MG SL tablet Place 0.4 mg under the tongue every 5 (five) minutes as needed for chest pain.    . Omega-3 Fatty Acids (FISH OIL) 1000 MG CAPS Take 1 capsule by mouth daily.     . pantoprazole (PROTONIX) 40 MG tablet Take 1 tablet (40 mg total) by mouth daily. 90 tablet 3  . predniSONE (DELTASONE) 1 MG tablet Take 4 mg by mouth daily.    [name]  rosuvastatin (CRESTOR) 10 MG tablet Take 1 tablet (10 mg total) by mouth daily. 90 tablet 3  . SIMPONI ARIA 50 MG/4ML SOLN injection Inject 50 mg into the vein. Every 60 Days    . nitroGLYCERIN (NITROSTAT) 0.4 MG SL tablet Place 1 tablet (0.4 mg total) under the tongue every 5 (five) minutes as needed for chest pain. 25 tablet 3   No current facility-administered medications for this visit.     ALLERGIES: Bupropion, Sulfamethoxazole-trimethoprim, and Wellbutrin [bupropion hcl]  Family History  Problem Relation Age of Onset  . Kidney cancer Father 55  . Pneumonia Father   . Hypertension Father   . Hypertension Mother   . Deep vein thrombosis Mother   . Alzheimer's disease Mother   . Osteopenia Mother   . Heart attack Maternal Grandfather   . Breast cancer Sister 35       recurrence 49  . Allergies Brother   . Breast cancer Sister 28       Stage 0  . Breast cancer Sister 65       Stage 3, bilateral mastectomy  . Heart attack Brother 20  . Deep vein thrombosis Sister           . Pancreatic cancer Paternal Aunt 25  . Breast cancer Maternal Aunt 67  . Prostate cancer Maternal Uncle 68  . Pancreatic cancer Paternal Uncle 64  . Uterine cancer Paternal Grandmother 31  . Colon cancer Paternal Grandfather 25  . Pancreatic cancer Paternal Aunt 30  . Lung cancer Paternal Uncle 62  . Brain cancer Paternal Aunt   . Kidney cancer Cousin 76  . Kidney cancer Cousin 7  . Uterine cancer Cousin 58  . Prostate cancer Cousin 38  . Lung cancer Maternal Uncle 45  . Breast cancer Cousin 5  . Thyroid disease Neg Hx     Social History   Socioeconomic  History  . Marital status: Married    Spouse name: [name]  . Number of children: 0  . Years of education: Not on file  . Highest education level: Master's degree (e.g., MA, MS, MEng, MEd, MSW, MBA)  Occupational History  . Occupation: Special Ed Teacher    Comment: Engineer, manufacturing  Tobacco Use  . Smoking status: Former Smoker    Packs/day: 1.50    Years: 39.00    Pack years: 58.50    Types: Cigarettes    Quit date: 08/01/2017    Years since quitting: 2.2  . Smokeless tobacco: Never Used  . Tobacco comment: using nicoderm patches  Vaping Use  . Vaping Use: Never used  Substance and Sexual Activity  . Alcohol use: Not Currently  . Drug use: No  . Sexual activity: Yes    Partners: Male    Birth control/protection: Surgical, Post-menopausal    Comment: Hysterectomy  Other Topics Concern  . Not on file  Social History Narrative  . Not on file   Social Determinants of Health   Financial Resource Strain: Low Risk   . Difficulty of Paying Living Expenses: Not hard at all  Food Insecurity: No Food Insecurity  . Worried About Charity fundraiser in the Last Year: Never true  . Ran Out of Food in the Last Year: Never true  Transportation Needs: No Transportation Needs  . Lack of Transportation (Medical): No  . Lack of Transportation (Non-Medical): No  Physical Activity: Inactive  . Days of Exercise per Week: 0 days  . Minutes of Exercise per Session: 0  min  Stress: Stress Concern Present  . Feeling of Stress : Rather much  Social Connections:   . Frequency of Communication with Friends and Family:   . Frequency of Social Gatherings with Friends and Family:   . Attends Religious Services:   . Active Member of Clubs or Organizations:   . Attends Archivist Meetings:   [name] Marital Status:   Intimate Partner Violence:   . Fear of Current or Ex-Partner:   . Emotionally Abused:   [name] Physically Abused:   . Sexually Abused:     Review of Systems  All other systems  reviewed and are negative.   PHYSICAL EXAMINATION:    Pulse 73   Ht _0  (1.702 m)   Wt 227 lb (103 kg)   LMP 03/19/1998 Comment: h/o supracervical hysterectomy  SpO2 98%   BMI 35.55 kg/m     General appearance: alert, cooperative and appears stated age Back: not tender to palpation No CVA tenderness  ASSESSMENT Right sided back, improving. Suspect MS pain Overactive bladder, negative urine culture from last week Trace blood on urine dip at the hospital, negative today    PLAN Discussed muscle strain, information given Cut on back on caffeine Information on bladder training given Discussed the option of PT or medication   An After Visit Summary was printed and given to the patient.  ~23 minutes spent in total patient care.

## 2019-11-02 ENCOUNTER — Other Ambulatory Visit: Payer: Self-pay

## 2019-11-02 ENCOUNTER — Encounter (HOSPITAL_COMMUNITY): Payer: Self-pay | Admitting: Emergency Medicine

## 2019-11-02 DIAGNOSIS — K5730 Diverticulosis of large intestine without perforation or abscess without bleeding: Secondary | ICD-10-CM | POA: Diagnosis not present

## 2019-11-02 DIAGNOSIS — R110 Nausea: Secondary | ICD-10-CM | POA: Insufficient documentation

## 2019-11-02 DIAGNOSIS — J309 Allergic rhinitis, unspecified: Secondary | ICD-10-CM | POA: Diagnosis not present

## 2019-11-02 DIAGNOSIS — I10 Essential (primary) hypertension: Secondary | ICD-10-CM | POA: Diagnosis not present

## 2019-11-02 DIAGNOSIS — R0789 Other chest pain: Secondary | ICD-10-CM | POA: Diagnosis not present

## 2019-11-02 DIAGNOSIS — K429 Umbilical hernia without obstruction or gangrene: Secondary | ICD-10-CM | POA: Diagnosis not present

## 2019-11-02 DIAGNOSIS — K219 Gastro-esophageal reflux disease without esophagitis: Secondary | ICD-10-CM | POA: Diagnosis not present

## 2019-11-02 DIAGNOSIS — N281 Cyst of kidney, acquired: Secondary | ICD-10-CM | POA: Diagnosis not present

## 2019-11-02 DIAGNOSIS — R109 Unspecified abdominal pain: Secondary | ICD-10-CM | POA: Diagnosis not present

## 2019-11-02 DIAGNOSIS — Z7982 Long term (current) use of aspirin: Secondary | ICD-10-CM | POA: Insufficient documentation

## 2019-11-02 DIAGNOSIS — Z87891 Personal history of nicotine dependence: Secondary | ICD-10-CM | POA: Diagnosis not present

## 2019-11-02 DIAGNOSIS — I700 Atherosclerosis of aorta: Secondary | ICD-10-CM | POA: Diagnosis not present

## 2019-11-02 DIAGNOSIS — R6883 Chills (without fever): Secondary | ICD-10-CM | POA: Insufficient documentation

## 2019-11-02 DIAGNOSIS — M549 Dorsalgia, unspecified: Secondary | ICD-10-CM | POA: Diagnosis not present

## 2019-11-02 DIAGNOSIS — R252 Cramp and spasm: Secondary | ICD-10-CM | POA: Insufficient documentation

## 2019-11-02 LAB — CBC
HCT: 42.2 % (ref 36.0–46.0)
Hemoglobin: 13.3 g/dL (ref 12.0–15.0)
MCH: 29.9 pg (ref 26.0–34.0)
MCHC: 31.5 g/dL (ref 30.0–36.0)
MCV: 94.8 fL (ref 80.0–100.0)
Platelets: 303 10*3/uL (ref 150–400)
RBC: 4.45 MIL/uL (ref 3.87–5.11)
RDW: 14.7 % (ref 11.5–15.5)
WBC: 7.2 10*3/uL (ref 4.0–10.5)
nRBC: 0 % (ref 0.0–0.2)

## 2019-11-02 LAB — COMPREHENSIVE METABOLIC PANEL
ALT: 31 U/L (ref 0–44)
AST: 33 U/L (ref 15–41)
Albumin: 4.1 g/dL (ref 3.5–5.0)
Alkaline Phosphatase: 52 U/L (ref 38–126)
Anion gap: 8 (ref 5–15)
BUN: 16 mg/dL (ref 8–23)
CO2: 28 mmol/L (ref 22–32)
Calcium: 9.8 mg/dL (ref 8.9–10.3)
Chloride: 105 mmol/L (ref 98–111)
Creatinine, Ser: 1.07 mg/dL — ABNORMAL HIGH (ref 0.44–1.00)
GFR calc Af Amer: >60 mL/min (ref >60)
GFR calc non Af Amer: 53 mL/min — ABNORMAL LOW (ref >60)
Glucose, Bld: 97 mg/dL (ref 70–99)
Potassium: 3.9 mmol/L (ref 3.5–5.1)
Sodium: 141 mmol/L (ref 135–145)
Total Bilirubin: 0.6 mg/dL (ref 0.3–1.2)
Total Protein: 7.6 g/dL (ref 6.5–8.1)

## 2019-11-02 LAB — URINALYSIS, ROUTINE W REFLEX MICROSCOPIC
Bilirubin Urine: NEGATIVE
Glucose, UA: NEGATIVE mg/dL
Hgb urine dipstick: NEGATIVE
Ketones, ur: NEGATIVE mg/dL
Nitrite: NEGATIVE
Protein, ur: NEGATIVE mg/dL
Specific Gravity, Urine: 1.004 — ABNORMAL LOW (ref 1.005–1.030)
pH: 6 (ref 5.0–8.0)

## 2019-11-02 LAB — LIPASE, BLOOD: Lipase: 19 U/L (ref 11–51)

## 2019-11-02 NOTE — ED Triage Notes (Signed)
Pt reports pains on right abd pains that radiates to flank since beginning of August. Was seen by PCP today who sent to ED for CT scan to rule out kidney stones. reports urinates frequently. Reports pain is like an intermittent spasm.

## 2019-11-03 ENCOUNTER — Emergency Department (HOSPITAL_COMMUNITY)
Admission: EM | Admit: 2019-11-03 | Discharge: 2019-11-03 | Disposition: A | Discharge status: Home / Self Care | Payer: Medicare Other | Attending: Emergency Medicine | Admitting: Emergency Medicine

## 2019-11-03 ENCOUNTER — Emergency Department (HOSPITAL_COMMUNITY): Payer: Medicare Other

## 2019-11-03 DIAGNOSIS — K5730 Diverticulosis of large intestine without perforation or abscess without bleeding: Secondary | ICD-10-CM | POA: Diagnosis not present

## 2019-11-03 DIAGNOSIS — R109 Unspecified abdominal pain: Secondary | ICD-10-CM

## 2019-11-03 DIAGNOSIS — I700 Atherosclerosis of aorta: Secondary | ICD-10-CM | POA: Diagnosis not present

## 2019-11-03 DIAGNOSIS — K429 Umbilical hernia without obstruction or gangrene: Secondary | ICD-10-CM | POA: Diagnosis not present

## 2019-11-03 DIAGNOSIS — N281 Cyst of kidney, acquired: Secondary | ICD-10-CM | POA: Diagnosis not present

## 2019-11-03 DIAGNOSIS — L403 Pustulosis palmaris et plantaris: Secondary | ICD-10-CM | POA: Diagnosis not present

## 2019-11-03 DIAGNOSIS — R252 Cramp and spasm: Secondary | ICD-10-CM | POA: Diagnosis not present

## 2019-11-03 MED ORDER — DICYCLOMINE HCL 20 MG PO TABS: 10.0000 mg | ORAL_TABLET | Freq: Four times a day (QID) | ORAL | 0 refills | Status: DC | PRN

## 2019-11-03 MED ORDER — DICYCLOMINE HCL 10 MG PO CAPS
10.0000 mg | ORAL_CAPSULE | Freq: Once | ORAL | Status: AC
  Administered 2019-11-03: 10 mg via ORAL
  Filled 2019-11-03: qty 1

## 2019-11-03 NOTE — ED Provider Notes (Signed)
[facility] DEPT Provider Note: Georgena Spurling, MD, FACEP  CSN: [id number] MRN: [id number] ARRIVAL: 11/02/19 at [place]: WHALB/WHALB   CHIEF COMPLAINT  Flank Pain and Abdominal Pain   HISTORY OF PRESENT ILLNESS  11/03/19 5:17 AM Pamela Lowe is a 69 y.o. female with right flank pain radiating to the right abdomen since the beginning of this month.  She describes the pain as intermittent spasms, very brief but very severe in intensity.  She sometimes characterizes them as feeling like gas pains.  They may be brought on by certain movements or positions but often occur spontaneously.  She has had occasional nausea and chills but no vomiting or dysuria.  She was seen on 10/21/2019 and treated with topical care [place] on her right flank.  Urine culture was unremarkable.  She was seen again yesterday and was advised to go to the ED for a CT scan to evaluate for kidney stones.   Past Medical History:  Diagnosis Date  . Allergic rhinitis   . Breast cyst 2007   Right  . Cataract    surgery 07/2017  . Depression 2006   w/ menopause  . Family history of breast cancer   . Family history of colon cancer   . Family history of kidney cancer 01/15/2018  . Family history of lung cancer   . Family history of pancreatic cancer   . Family history of prostate cancer   . Family history of uterine cancer   . GERD (gastroesophageal reflux disease)   . Glaucoma   . History of hysterectomy, supracervical   . Hyperlipidemia   . Hypertension   . 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   MLH1 [id number] (Splice site)  . Multinodular goiter   . Tobacco user     Past Surgical History:  Procedure Laterality Date  . BREAST BIOPSY Bilateral 1985  . CARDIAC CATHETERIZATION    . CATARACT EXTRACTION Right 07/2017  . COLONOSCOPY    . CORONARY STENT INTERVENTION N/A 12/26/2018   Procedure: CORONARY STENT INTERVENTION;  Surgeon: Belva Crome, MD;  Location: [facility];  Service:  Cardiovascular;  Laterality: N/A;  . LEFT HEART CATH AND CORONARY ANGIOGRAPHY N/A 12/17/2018   Procedure: LEFT HEART CATH AND CORONARY ANGIOGRAPHY;  Surgeon: Belva Crome, MD;  Location: [facility];  Service: Cardiovascular;  Laterality: N/A;  . SUPRACERVICAL ABDOMINAL HYSTERECTOMY  1986   h/o supracervical hysterectomy  . UPPER GASTROINTESTINAL ENDOSCOPY      Family History  Problem Relation Age of Onset  . Kidney cancer Father 39  . Pneumonia Father   . Hypertension Father   . Hypertension Mother   . Deep vein thrombosis Mother   . Alzheimer's disease Mother   . Osteopenia Mother   . Heart attack Maternal Grandfather   . Breast cancer Sister 62       recurrence 63  . Allergies Brother   . Breast cancer Sister 58       Stage 0  . Breast cancer Sister 61       Stage 3, bilateral mastectomy  . Heart attack Brother 35  . Deep vein thrombosis Sister           . Pancreatic cancer Paternal Aunt 57  . Breast cancer Maternal Aunt 67  . Prostate cancer Maternal Uncle 68  . Pancreatic cancer Paternal Uncle 6  . Uterine cancer Paternal Grandmother 37  . Colon cancer Paternal Grandfather 29  . Pancreatic cancer Paternal Aunt 26  .  Lung cancer Paternal Uncle 14  . Brain cancer Paternal Aunt   . Kidney cancer Cousin 50  . Kidney cancer Cousin 59  . Uterine cancer Cousin 46  . Prostate cancer Cousin 25  . Lung cancer Maternal Uncle 74  . Breast cancer Cousin 41  . Thyroid disease Neg Hx     Social History   Tobacco Use  . Smoking status: Former Smoker    Packs/day: 1.50    Years: 39.00    Pack years: 58.50    Types: Cigarettes    Quit date: 08/01/2017    Years since quitting: 2.2  . Smokeless tobacco: Never Used  . Tobacco comment: using nicoderm patches  Vaping Use  . Vaping Use: Never used  Substance Use Topics  . Alcohol use: Not Currently  . Drug use: No    Prior to Admission medications   Medication Sig Start Date End Date Taking? Authorizing Provider   aspirin (ASPIRIN 81) 81 MG EC tablet Take 81 mg by mouth every evening.     [provider]  benazepril (LOTENSIN) 20 MG tablet Take 1 tablet (20 mg total) by mouth daily. 01/08/19   Belva Crome, MD  betamethasone dipropionate (DIPROLENE) 0.05 % ointment Apply topically. 09/10/19   [provider]  brimonidine (ALPHAGAN) 0.2 % ophthalmic solution Place 1 drop into both eyes 3 (three) times daily.  11/20/18   [provider]  Calcium Carb-Cholecalciferol (CALCIUM-VITAMIN D) 600-400 MG-UNIT TABS Take 1 tablet by mouth daily.    [provider]  clindamycin (CLEOCIN T) 1 % lotion Apply 1 application topically daily.    [provider]  clopidogrel (PLAVIX) 75 MG tablet Take 1 tablet (75 mg total) by mouth daily. 12/19/18   Belva Crome, MD  diclofenac Sodium (VOLTAREN) 1 % GEL Apply 4 g topically 4 (four) times daily. 10/21/19   Darr, Marguerita Beards, PA-C  dicyclomine (BENTYL) 20 MG tablet Take 0.5-1 tablets (10-20 mg total) by mouth 4 (four) times daily as needed for spasms. 11/03/19   Madalaine Portier, MD  dorzolamide (TRUSOPT) 2 % ophthalmic solution Place 1 drop into both eyes 2 (two) times daily.  10/02/18   [provider]  dorzolamide-timolol (COSOPT) 22.3-6.8 MG/ML ophthalmic solution Place 1 drop into the right eye 2 (two) times daily. 06/29/19   [provider]  folic acid (FOLVITE) 1 MG tablet Take 1 mg by mouth daily.     [provider]  hydrochlorothiazide (MICROZIDE) 12.5 MG capsule Take 1 capsule (12.5 mg total) by mouth every Monday, Wednesday, and Friday. 02/23/19   Belva Crome, MD  methotrexate (RHEUMATREX) 2.5 MG tablet Take 10 mg by mouth once a week. Mondays Take 4 tablets (10 mg)    [provider]  metoprolol succinate (TOPROL-XL) 25 MG 24 hr tablet Take 1 tablet by mouth once daily 08/11/19   Belva Crome, MD  Multiple Vitamins-Minerals (MULTIVITAMIN WITH MINERALS) tablet Take 1 tablet by mouth daily.     [provider]  nitroGLYCERIN (NITROSTAT) 0.4 MG SL tablet Place 1 tablet (0.4 mg total) under the tongue every 5 (five) minutes as needed for chest pain. 08/21/18 03/19/19  Belva Crome, MD  nitroGLYCERIN (NITROSTAT) 0.4 MG SL tablet Place 0.4 mg under the tongue every 5 (five) minutes as needed for chest pain.    [provider]  Omega-3 Fatty Acids (FISH OIL) 1000 MG CAPS Take 1 capsule by mouth daily.     [provider]  pantoprazole (PROTONIX) 40 MG tablet Take 1 tablet (40 mg total) by mouth daily. 12/19/18   Belva Crome, MD  predniSONE (DELTASONE) 1 MG tablet Take 4 mg by mouth daily. 09/07/19   [provider]  rosuvastatin (CRESTOR) 10 MG tablet Take 1 tablet (10 mg total) by mouth daily. 09/30/19 12/29/19  Belva Crome, MD  SIMPONI ARIA 50 MG/4ML SOLN injection Inject 50 mg into the vein. Every 60 Days 07/16/19   [provider]    Allergies Bupropion, Sulfamethoxazole-trimethoprim, and Wellbutrin [bupropion hcl]   REVIEW OF SYSTEMS  Negative except as noted here or in the History of Present Illness.   PHYSICAL EXAMINATION  Initial Vital Signs Blood pressure 130/84, pulse 68, temperature 98 F (36.7 C), temperature source Oral, resp. rate 16, last menstrual period 03/19/1998, SpO2 95 %.  Examination General: Well-developed, well-nourished female in no acute distress; appearance consistent with age of record HENT: normocephalic; atraumatic Eyes: pupils equal, round and reactive to light; extraocular muscles intact Neck: supple Heart: regular rate and rhythm Lungs: clear to auscultation bilaterally Abdomen: soft; nondistended; nontender; bowel sounds present GU: No CVA tenderness Extremities: No deformity; full range of motion; pulses normal Neurologic: Awake, alert and oriented; motor function intact in all extremities and symmetric; no facial droop Skin: Warm and dry Psychiatric: Normal mood and affect   RESULTS  Summary  of this visit's results, reviewed and interpreted by myself:   EKG Interpretation  Date/Time:    Ventricular Rate:    PR Interval:    QRS Duration:   QT Interval:    QTC Calculation:   R Axis:     Text Interpretation:        Laboratory Studies: Results for orders placed or performed during the hospital encounter of 11/03/19 (from the past 24 hour(s))  Lipase, blood     Status: None   Collection Time: 11/02/19  6:14 PM  Result Value Ref Range   Lipase 19 11 - 51 U/L  Comprehensive metabolic panel     Status: Abnormal   Collection Time: 11/02/19  6:14 PM  Result Value Ref Range   Sodium 141 135 - 145 mmol/L   Potassium 3.9 3.5 - 5.1 mmol/L   Chloride 105 98 - 111 mmol/L   CO2 28 22 - 32 mmol/L   Glucose, Bld 97 70 - 99 mg/dL   BUN 16 8 - 23 mg/dL   Creatinine, Ser 1.07 (H) 0.44 - 1.00 mg/dL   Calcium 9.8 8.9 - 10.3 mg/dL   Total Protein 7.6 6.5 - 8.1 g/dL   Albumin 4.1 3.5 - 5.0 g/dL   AST 33 15 - 41 U/L   ALT 31 0 - 44 U/L   Alkaline Phosphatase 52 38 - 126 U/L   Total Bilirubin 0.6 0.3 - 1.2 mg/dL   GFR calc non Af Amer 53 (L) >60 mL/min   GFR calc Af Amer >60 >60 mL/min   Anion gap 8 5 - 15  CBC     Status: None   Collection Time: 11/02/19  6:14 PM  Result Value Ref Range   WBC 7.2 4.0 - 10.5 K/uL   RBC 4.45 3.87 - 5.11 MIL/uL   Hemoglobin 13.3 12.0 - 15.0 g/dL   HCT 42.2 36 - 46 %   MCV 94.8 80.0 - 100.0 fL   MCH 29.9 26.0 - 34.0 pg   MCHC 31.5 30.0 - 36.0 g/dL   RDW 14.7 11.5 - 15.5 %   Platelets 303 150 -  400 K/uL   nRBC 0.0 0.0 - 0.2 %  Urinalysis, Routine w reflex microscopic Urine, Clean Catch     Status: Abnormal   Collection Time: 11/02/19  7:19 PM  Result Value Ref Range   Color, Urine STRAW (A) YELLOW   APPearance CLEAR CLEAR   Specific Gravity, Urine 1.004 (L) 1.005 - 1.030   pH 6.0 5.0 - 8.0   Glucose, UA NEGATIVE NEGATIVE mg/dL   Hgb urine dipstick NEGATIVE NEGATIVE   Bilirubin Urine NEGATIVE NEGATIVE   Ketones, ur NEGATIVE NEGATIVE mg/dL     Protein, ur NEGATIVE NEGATIVE mg/dL   Nitrite NEGATIVE NEGATIVE   Leukocytes,Ua MODERATE (A) NEGATIVE   RBC / HPF 0-5 0 - 5 RBC/hpf   WBC, UA 6-10 0 - 5 WBC/hpf   Bacteria, UA RARE (A) NONE SEEN   Squamous Epithelial / LPF 0-5 0 - 5   Imaging Studies: CT Renal Stone Study  Result Date: 11/03/2019 CLINICAL DATA:  Right flank pain, stone disease suspected EXAM: CT ABDOMEN AND PELVIS WITHOUT CONTRAST TECHNIQUE: Multidetector CT imaging of the abdomen and pelvis was performed following the standard protocol without IV contrast. COMPARISON:  CT 01/30/2019 FINDINGS: Lower chest: Lung bases are clear. Normal heart size. No pericardial effusion. Coronary artery calcifications within the LAD. Hepatobiliary: No visible concerning liver lesions on this unenhanced CT. Normal liver attenuation. Smooth liver surface contour. Normal gallbladder and biliary tree without visible gallstones or pericholecystic inflammation. Pancreas: Unremarkable. No pancreatic ductal dilatation or surrounding inflammatory changes. Spleen: Normal in size. No concerning splenic lesions. Adrenals/Urinary Tract: Normal adrenal glands. Kidneys are symmetric in size and normally located. Stable 1.7 cm fluid attenuation cyst in the upper pole left kidney. No visible concerning renal lesions. No urolithiasis or hydronephrosis is evident. Urinary bladder is largely decompressed at the time of exam and therefore poorly evaluated by CT imaging. No gross bladder abnormality. Stomach/Bowel: Distal esophagus, stomach and duodenal sweep are unremarkable. No small bowel wall thickening or dilatation. No evidence of obstruction. Appendix is not visualized. No focal inflammation the vicinity of the cecum to suggest an occult appendicitis. No colonic dilatation or wall thickening. Scattered colonic diverticula without focal inflammation to suggest diverticulitis. Vascular/Lymphatic: Atherosclerotic calcifications within the abdominal aorta and branch  vessels. No aneurysm or ectasia. No enlarged abdominopelvic lymph nodes. Reproductive: Slightly retroverted uterus. No concerning adnexal lesions. Other: No abdominopelvic free fluid or free gas. No bowel containing hernias. Small fat containing umbilical hernia. Musculoskeletal: Multilevel degenerative changes are present in the imaged portions of the spine. No acute osseous abnormality or suspicious osseous lesion. IMPRESSION: 1. No acute intra-abdominal process to provide cause for patient's symptoms. Specifically, no urolithiasis or hydronephrosis is evident. 2. Stable appearance of a left renal cyst. 3. Colonic diverticulosis without evidence of diverticulitis. 4. Coronary artery calcifications within the LAD. 5. Aortic Atherosclerosis (ICD10-I70.0). Electronically Signed   By: Lovena Le M.D.   On: 11/03/2019 05:53    ED COURSE and MDM  Nursing notes, initial and subsequent vitals signs, including pulse oximetry, reviewed and interpreted by myself.  Vitals:   11/02/19 2011 11/02/19 2014 11/02/19 2253 11/03/19 0429  BP: (!) 160/114 (!) 164/88 (!) 155/88 130/84  Pulse: (!) 56  (!) 56 68  Resp: _0 Temp:    98 F (36.7 C)  TempSrc:    Oral  SpO2: 100%  99% 95%   Medications  dicyclomine (BENTYL) capsule 10 mg (has no administration in time range)    6:12 AM Patient  advised of reassuring CT findings.  The nature of her pain suggest bowel spasms and we will trial Bentyl pending follow-up.  She does have a gastroenterologist with whom she is established.  PROCEDURES  Procedures   ED DIAGNOSES     ICD-10-CM   1. Abdominal spasms  R10.9        K[place]alu Heggs, Jenny Reichmann, MD 11/03/19 [phone]

## 2019-11-03 NOTE — ED Notes (Signed)
Patient transported to CT 

## 2019-11-03 NOTE — ED Notes (Signed)
Lab called to add on urine culture, states they will run it

## 2019-11-04 LAB — URINE CULTURE: Culture: <10000 — AB

## 2019-11-16 DIAGNOSIS — E669 Obesity, unspecified: Secondary | ICD-10-CM | POA: Diagnosis not present

## 2019-11-16 DIAGNOSIS — Z6835 Body mass index (BMI) 35.0-35.9, adult: Secondary | ICD-10-CM | POA: Diagnosis not present

## 2019-11-16 DIAGNOSIS — R7989 Other specified abnormal findings of blood chemistry: Secondary | ICD-10-CM | POA: Diagnosis not present

## 2019-11-16 DIAGNOSIS — M0589 Other rheumatoid arthritis with rheumatoid factor of multiple sites: Secondary | ICD-10-CM | POA: Diagnosis not present

## 2019-11-16 DIAGNOSIS — Z79899 Other long term (current) drug therapy: Secondary | ICD-10-CM | POA: Diagnosis not present

## 2019-11-16 DIAGNOSIS — H209 Unspecified iridocyclitis: Secondary | ICD-10-CM | POA: Diagnosis not present

## 2019-11-16 DIAGNOSIS — M2550 Pain in unspecified joint: Secondary | ICD-10-CM | POA: Diagnosis not present

## 2019-11-16 DIAGNOSIS — L403 Pustulosis palmaris et plantaris: Secondary | ICD-10-CM | POA: Diagnosis not present

## 2019-11-18 DIAGNOSIS — H401133 Primary open-angle glaucoma, bilateral, severe stage: Secondary | ICD-10-CM | POA: Diagnosis not present

## 2019-11-18 DIAGNOSIS — H20023 Recurrent acute iridocyclitis, bilateral: Secondary | ICD-10-CM | POA: Diagnosis not present

## 2019-11-20 ENCOUNTER — Other Ambulatory Visit: Payer: Medicare Other | Admitting: *Deleted

## 2019-11-20 ENCOUNTER — Other Ambulatory Visit: Payer: Self-pay

## 2019-11-20 DIAGNOSIS — I25118 Atherosclerotic heart disease of native coronary artery with other forms of angina pectoris: Secondary | ICD-10-CM | POA: Diagnosis not present

## 2019-11-20 DIAGNOSIS — E782 Mixed hyperlipidemia: Secondary | ICD-10-CM

## 2019-11-21 LAB — HEPATIC FUNCTION PANEL
ALT: 23 IU/L (ref 0–32)
AST: 28 IU/L (ref 0–40)
Albumin: 4.3 g/dL (ref 3.8–4.8)
Alkaline Phosphatase: 55 IU/L (ref 48–121)
Bilirubin Total: 0.7 mg/dL (ref 0.0–1.2)
Bilirubin, Direct: 0.18 mg/dL (ref 0.00–0.40)
Total Protein: 6.7 g/dL (ref 6.0–8.5)

## 2019-11-21 LAB — LIPID PANEL
Chol/HDL Ratio: 2.3 ratio (ref 0.0–4.4)
Cholesterol, Total: 178 mg/dL (ref 100–199)
HDL: 78 mg/dL (ref >39)
LDL Chol Calc (NIH): 87 mg/dL (ref 0–99)
Triglycerides: 72 mg/dL (ref 0–149)
VLDL Cholesterol Cal: 13 mg/dL (ref 5–40)

## 2019-11-24 ENCOUNTER — Ambulatory Visit
Admission: RE | Admit: 2019-11-24 | Discharge: 2019-11-24 | Disposition: A | Discharge status: Home / Self Care | Payer: Medicare Other | Source: Ambulatory Visit | Attending: Hematology | Admitting: Hematology

## 2019-11-24 ENCOUNTER — Other Ambulatory Visit: Payer: Self-pay

## 2019-11-24 ENCOUNTER — Encounter: Payer: Self-pay | Admitting: Obstetrics & Gynecology

## 2019-11-24 DIAGNOSIS — Z1509 Genetic susceptibility to other malignant neoplasm: Secondary | ICD-10-CM

## 2019-11-24 DIAGNOSIS — N281 Cyst of kidney, acquired: Secondary | ICD-10-CM | POA: Diagnosis not present

## 2019-11-24 DIAGNOSIS — Z1239 Encounter for other screening for malignant neoplasm of breast: Secondary | ICD-10-CM

## 2019-11-24 MED ORDER — GADOBUTROL 1 MMOL/ML IV SOLN
10.0000 mL | Freq: Once | INTRAVENOUS | Status: AC | PRN
  Administered 2019-11-24: 10 mL via INTRAVENOUS

## 2019-11-26 NOTE — Progress Notes (Signed)
Cardiology Office Note:    Date:  11/27/2019   ID:  ADONIA PORADA, DOB 29-Jul-1950, MRN [id number]  PCP:  [name], PA-C  Cardiologist:  Sinclair Grooms, MD   Referring MD: [name], PA-C   Chief Complaint  Patient presents with  . Coronary Artery Disease    History of Present Illness:    Pamela Lowe is a 69 y.o. female with a hx of CAD with significant occlusive LAD disease per coronary CTA/FFR performed 08/2018, hyperlipidemia, hypertension and PAD.  Stent LAD total occlusion and first diagonal October 2020.  She is doing okay. She denies angina. Crestor was decreased from 40 to 10 mg because of elevated liver enzymes. Zetia was discontinued. She is ambulatory without angina. We reviewed the recent lipid panel. LDL was 87. This is higher than I would like. We will increase the Crestor to 20 mg/day from 10 and repeat liver enzymes in about 6 weeks.  She is encouraged to continue physical activity greater than 150 minutes/week.  Past Medical History:  Diagnosis Date  . Allergic rhinitis   . Breast cyst 2007   Right  . Cataract    surgery 07/2017  . Depression 2006   w/ menopause  . Family history of breast cancer   . Family history of colon cancer   . Family history of kidney cancer 01/15/2018  . Family history of lung cancer   . Family history of pancreatic cancer   . Family history of prostate cancer   . Family history of uterine cancer   . GERD (gastroesophageal reflux disease)   . Glaucoma   . History of hysterectomy, supracervical   . Hyperlipidemia   . Hypertension   . 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   MLH1 [id number] (Splice site)  . Multinodular goiter   . Tobacco user     Past Surgical History:  Procedure Laterality Date  . BREAST BIOPSY Bilateral 1985  . CARDIAC CATHETERIZATION    . CATARACT EXTRACTION Right 07/2017  . COLONOSCOPY    . CORONARY STENT INTERVENTION N/A 12/26/2018   Procedure: CORONARY STENT INTERVENTION;   Surgeon: Belva Crome, MD;  Location: [facility];  Service: Cardiovascular;  Laterality: N/A;  . LEFT HEART CATH AND CORONARY ANGIOGRAPHY N/A 12/17/2018   Procedure: LEFT HEART CATH AND CORONARY ANGIOGRAPHY;  Surgeon: Belva Crome, MD;  Location: [facility];  Service: Cardiovascular;  Laterality: N/A;  . SUPRACERVICAL ABDOMINAL HYSTERECTOMY  1986   h/o supracervical hysterectomy  . UPPER GASTROINTESTINAL ENDOSCOPY      Current Medications: Current Meds  Medication Sig  . aspirin (ASPIRIN 81) 81 MG EC tablet Take 81 mg by mouth every evening.   . benazepril (LOTENSIN) 20 MG tablet Take 1 tablet (20 mg total) by mouth daily.  . betamethasone dipropionate (DIPROLENE) 0.05 % ointment Apply topically.  . brimonidine (ALPHAGAN) 0.2 % ophthalmic solution Place 1 drop into both eyes 3 (three) times daily.   . Calcium Carb-Cholecalciferol (CALCIUM-VITAMIN D) 600-400 MG-UNIT TABS Take 1 tablet by mouth daily.  . clindamycin (CLEOCIN T) 1 % lotion Apply 1 application topically daily.  . clopidogrel (PLAVIX) 75 MG tablet Take 1 tablet (75 mg total) by mouth daily.  [name] dicyclomine (BENTYL) 20 MG tablet Take 0.5-1 tablets (10-20 mg total) by mouth 4 (four) times daily as needed for spasms.  . dorzolamide (TRUSOPT) 2 % ophthalmic solution Place 1 drop into both eyes 2 (two) times daily.   . dorzolamide-timolol (COSOPT) 22.3-6.8 MG/ML  ophthalmic solution Place 1 drop into the right eye 2 (two) times daily.  . folic acid (FOLVITE) 1 MG tablet Take 1 mg by mouth daily.   . hydrochlorothiazide (MICROZIDE) 12.5 MG capsule Take 1 capsule (12.5 mg total) by mouth every Monday, Wednesday, and Friday.  . methotrexate (RHEUMATREX) 2.5 MG tablet Take 10 mg by mouth once a week. Mondays Take 4 tablets (10 mg)  . metoprolol succinate (TOPROL-XL) 25 MG 24 hr tablet Take 1 tablet by mouth once daily  . Multiple Vitamins-Minerals (MULTIVITAMIN WITH MINERALS) tablet Take 1 tablet by mouth daily.  .  nitroGLYCERIN (NITROSTAT) 0.4 MG SL tablet Place 0.4 mg under the tongue every 5 (five) minutes as needed for chest pain.  . Omega-3 Fatty Acids (FISH OIL) 1000 MG CAPS Take 1 capsule by mouth daily.   . pantoprazole (PROTONIX) 40 MG tablet Take 1 tablet (40 mg total) by mouth daily.  . predniSONE (DELTASONE) 1 MG tablet Take 4 mg by mouth daily.  . rosuvastatin (CRESTOR) 10 MG tablet Take 1 tablet (10 mg total) by mouth daily.  [name] SIMPONI ARIA 50 MG/4ML SOLN injection Inject 50 mg into the vein. Every 60 Days     Allergies:   Bupropion, Sulfamethoxazole-trimethoprim, and Wellbutrin [bupropion hcl]   Social History   Socioeconomic History  . Marital status: Married    Spouse name: [name]  . Number of children: 0  . Years of education: Not on file  . Highest education level: Master's degree (e.g., MA, MS, MEng, MEd, MSW, MBA)  Occupational History  . Occupation: Special Ed Teacher    Comment: Engineer, manufacturing  Tobacco Use  . Smoking status: Former Smoker    Packs/day: 1.50    Years: 39.00    Pack years: 58.50    Types: Cigarettes    Quit date: 08/01/2017    Years since quitting: 2.3  . Smokeless tobacco: Never Used  . Tobacco comment: using nicoderm patches  Vaping Use  . Vaping Use: Never used  Substance and Sexual Activity  . Alcohol use: Not Currently  . Drug use: No  . Sexual activity: Yes    Partners: Male    Birth control/protection: Surgical, Post-menopausal    Comment: Hysterectomy  Other Topics Concern  . Not on file  Social History Narrative  . Not on file   Social Determinants of Health   Financial Resource Strain: Low Risk   . Difficulty of Paying Living Expenses: Not hard at all  Food Insecurity: No Food Insecurity  . Worried About Charity fundraiser in the Last Year: Never true  . Ran Out of Food in the Last Year: Never true  Transportation Needs: No Transportation Needs  . Lack of Transportation (Medical): No  . Lack of Transportation (Non-Medical): No   Physical Activity: Inactive  . Days of Exercise per Week: 0 days  . Minutes of Exercise per Session: 0 min  Stress: Stress Concern Present  . Feeling of Stress : Rather much  Social Connections:   . Frequency of Communication with Friends and Family: Not on file  . Frequency of Social Gatherings with Friends and Family: Not on file  . Attends Religious Services: Not on file  . Active Member of Clubs or Organizations: Not on file  . Attends Archivist Meetings: Not on file  . Marital Status: Not on file     Family History: The patient's family history includes Allergies in her brother; Alzheimer's disease in her mother; Brain cancer in  her paternal aunt; Breast cancer (age of onset: 15) in her sister; Breast cancer (age of onset: 55) in her sister; Breast cancer (age of onset: 32) in her sister; Breast cancer (age of onset: 43) in her cousin; Breast cancer (age of onset: 36) in her maternal aunt; Colon cancer (age of onset: 44) in her paternal grandfather; Deep vein thrombosis in her mother and sister; Heart attack in her maternal grandfather; Heart attack (age of onset: 45) in her brother; Hypertension in her father and mother; Kidney cancer (age of onset: 28) in her cousin; Kidney cancer (age of onset: 27) in her father; Kidney cancer (age of onset: 17) in her cousin; Lung cancer (age of onset: 67) in her paternal uncle; Lung cancer (age of onset: 78) in her maternal uncle; Osteopenia in her mother; Pancreatic cancer (age of onset: 13) in her paternal uncle; Pancreatic cancer (age of onset: 4) in her paternal aunt; Pancreatic cancer (age of onset: 21) in her paternal aunt; Pneumonia in her father; Prostate cancer (age of onset: 73) in her cousin; Prostate cancer (age of onset: 32) in her maternal uncle; Uterine cancer (age of onset: 74) in her cousin; Uterine cancer (age of onset: 57) in her paternal grandmother. There is no history of Thyroid disease.  ROS:   Please see the history  of present illness.    No specific complaints other than rheumatoid arthritis which is under good control. I have advised her to stop taking to [place]. All other systems reviewed and are negative.  EKGs/Labs/Other Studies Reviewed:    The following studies were reviewed today: No new data other than lipid panel which was reviewed  EKG:  EKG a new tracing is not repeated  Recent Labs: 06/08/2019: TSH 0.43 11/02/2019: BUN 16; Creatinine, Ser 1.07; Hemoglobin 13.3; Platelets 303; Potassium 3.9; Sodium 141 11/20/2019: ALT 23  Recent Lipid Panel    Component Value Date/Time   CHOL 178 11/20/2019 1452   TRIG 72 11/20/2019 1452   HDL 78 11/20/2019 1452   CHOLHDL 2.3 11/20/2019 1452   LDLCALC 87 11/20/2019 1452    Physical Exam:    VS:  BP 122/80   Pulse 73   Ht 5' 7"  (1.702 m)   Wt 221 lb 3.2 oz (100.3 kg)   LMP 03/19/1998 Comment: h/o supracervical hysterectomy  SpO2 97%   BMI 34.64 kg/m     Wt Readings from Last 3 Encounters:  11/27/19 221 lb 3.2 oz (100.3 kg)  10/27/19 227 lb (103 kg)  09/23/19 220 lb 8 oz (100 kg)     GEN: Overweight with BMI 35.. No acute distress HEENT: Normal NECK: No JVD. LYMPHATICS: No lymphadenopathy CARDIAC:  RRR without murmur, gallop, or edema. VASCULAR:  Normal Pulses. No bruits. RESPIRATORY:  Clear to auscultation without rales, wheezing or rhonchi  ABDOMEN: Soft, non-tender, non-distended, No pulsatile mass, MUSCULOSKELETAL: No deformity  SKIN: Warm and dry NEUROLOGIC:  Alert and oriented x 3 PSYCHIATRIC:  Normal affect   ASSESSMENT:    1. Coronary artery disease involving native coronary artery of native heart with other form of angina pectoris ([place])   2. PAD (peripheral artery disease) ([place])   3. Essential hypertension   4. Mixed hyperlipidemia   5. Educated about COVID-19 virus infection    PLAN:    In order of problems listed above:  1. Secondary prevention discussed 2. She is not having claudication. Secondary prevention  discussed.  3. Target blood pressure 130/80 mmHg or less. 4. Crestor will be increased  to 20 mg daily. We will follow liver enzymes closely. They normalized on the lower dose of Crestor from 40 mg to 10 mg. A lipid panel will be done in 6 weeks after increasing to 20 mg. 5. She has been vaccinated and is scheduled to get a booster.  Overall education and awareness concerning primary/secondary risk prevention was discussed in detail: LDL less than 70, hemoglobin A1c less than 7, blood pressure target less than 130/80 mmHg, >150 minutes of moderate aerobic activity per week, avoidance of smoking, weight control (via diet and exercise), and continued surveillance/management of/for obstructive sleep apnea.    Medication Adjustments/Labs and Tests Ordered: Current medicines are reviewed at length with the patient today.  Concerns regarding medicines are outlined above.  No orders of the defined types were placed in this encounter.  No orders of the defined types were placed in this encounter.   There are no Patient Instructions on file for this visit.   Signed, Sinclair Grooms, MD  11/27/2019 8:49 AM    [place]

## 2019-11-27 ENCOUNTER — Ambulatory Visit (INDEPENDENT_AMBULATORY_CARE_PROVIDER_SITE_OTHER): Payer: Medicare Other | Admitting: Interventional Cardiology

## 2019-11-27 ENCOUNTER — Other Ambulatory Visit: Payer: Self-pay

## 2019-11-27 ENCOUNTER — Other Ambulatory Visit: Payer: Medicare Other

## 2019-11-27 ENCOUNTER — Encounter: Payer: Self-pay | Admitting: Interventional Cardiology

## 2019-11-27 VITALS — BP 122/80 | HR 73 | Ht 67.0 in | Wt 221.2 lb

## 2019-11-27 DIAGNOSIS — I739 Peripheral vascular disease, unspecified: Secondary | ICD-10-CM | POA: Diagnosis not present

## 2019-11-27 DIAGNOSIS — E782 Mixed hyperlipidemia: Secondary | ICD-10-CM

## 2019-11-27 DIAGNOSIS — I10 Essential (primary) hypertension: Secondary | ICD-10-CM | POA: Diagnosis not present

## 2019-11-27 DIAGNOSIS — I25118 Atherosclerotic heart disease of native coronary artery with other forms of angina pectoris: Secondary | ICD-10-CM | POA: Diagnosis not present

## 2019-11-27 DIAGNOSIS — Z7189 Other specified counseling: Secondary | ICD-10-CM | POA: Diagnosis not present

## 2019-11-27 DIAGNOSIS — Z20822 Contact with and (suspected) exposure to covid-19: Secondary | ICD-10-CM | POA: Diagnosis not present

## 2019-11-27 MED ORDER — ROSUVASTATIN CALCIUM 20 MG PO TABS: 20.0000 mg | ORAL_TABLET | Freq: Every day | ORAL | 3 refills | Status: DC

## 2019-11-27 NOTE — Patient Instructions (Addendum)
Medication Instructions:  1) INCREASE Rosuvastatin to 20mg  once daily.  *If you need a refill on your cardiac medications before your next appointment, please call your pharmacy*   Lab Work: Lipid and Liver in 2 months.  You will need to be fasting for these labs (nothing to eat or drink after midnight except water and black coffee).  If you have labs (blood work) drawn today and your tests are completely normal, you will receive your results only by: [name] MyChart Message (if you have MyChart) OR . A paper copy in the mail If you have any lab test that is abnormal or we need to change your treatment, we will call you to review the results.   Testing/Procedures: None   Follow-Up: At [facility], you and your health needs are our priority.  As part of our continuing mission to provide you with exceptional heart care, we have created designated Provider Care Teams.  These Care Teams include your primary Cardiologist (physician) and Advanced Practice Providers (APPs -  Physician Assistants and Nurse Practitioners) who all work together to provide you with the care you need, when you need it.  We recommend signing up for the patient portal called "MyChart".  Sign up information is provided on this After Visit Summary.  MyChart is used to connect with patients for Virtual Visits (Telemedicine).  Patients are able to view lab/test results, encounter notes, upcoming appointments, etc.  Non-urgent messages can be sent to your provider as well.   To learn more about what you can do with MyChart, go to [email or web address].    Your next appointment:   12 month(s)  The format for your next appointment:   In Person  Provider:   You may see Sinclair Grooms, MD or one of the following Advanced Practice Providers on your designated Care Team:    [name], NP  Cecilie Kicks, NP  Kathyrn Drown, NP    Other Instructions

## 2019-11-30 LAB — NOVEL CORONAVIRUS, NAA: SARS-CoV-2, NAA: NOT DETECTED

## 2019-12-01 ENCOUNTER — Ambulatory Visit: Payer: Medicare Other | Attending: Internal Medicine

## 2019-12-01 DIAGNOSIS — Z23 Encounter for immunization: Secondary | ICD-10-CM

## 2019-12-01 NOTE — Progress Notes (Signed)
   Covid-19 Vaccination Clinic  Name:  Pamela Lowe    MRN: [id number] DOB: 04-17-1950  12/01/2019  [name] was observed post Covid-19 immunization for 15 minutes without incident. She was provided with Vaccine Information Sheet and instruction to access the V-Safe system.   [name] was instructed to call 911 with any severe reactions post vaccine: [name] Difficulty breathing  . Swelling of face and throat  . A fast heartbeat  . A bad rash all over body  . Dizziness and weakness

## 2019-12-03 ENCOUNTER — Other Ambulatory Visit: Payer: Self-pay | Admitting: Interventional Cardiology

## 2019-12-10 DIAGNOSIS — Z79899 Other long term (current) drug therapy: Secondary | ICD-10-CM | POA: Diagnosis not present

## 2019-12-10 DIAGNOSIS — M0589 Other rheumatoid arthritis with rheumatoid factor of multiple sites: Secondary | ICD-10-CM | POA: Diagnosis not present

## 2019-12-11 ENCOUNTER — Ambulatory Visit
Admission: RE | Admit: 2019-12-11 | Discharge: 2019-12-11 | Disposition: A | Discharge status: Home / Self Care | Payer: Medicare Other | Source: Ambulatory Visit | Attending: Hematology | Admitting: Hematology

## 2019-12-11 DIAGNOSIS — Z1239 Encounter for other screening for malignant neoplasm of breast: Secondary | ICD-10-CM

## 2019-12-11 DIAGNOSIS — N6012 Diffuse cystic mastopathy of left breast: Secondary | ICD-10-CM | POA: Diagnosis not present

## 2019-12-11 MED ORDER — GADOBUTROL 1 MMOL/ML IV SOLN
10.0000 mL | Freq: Once | INTRAVENOUS | Status: AC | PRN
  Administered 2019-12-11: 10 mL via INTRAVENOUS

## 2019-12-21 ENCOUNTER — Telehealth: Payer: Self-pay | Admitting: Interventional Cardiology

## 2019-12-21 NOTE — Telephone Encounter (Signed)
1. What dental office are you calling from? [facility]   2. What is your office phone number? [phone]  3. What is your fax number? [phone]  4. What type of procedure is the patient having performed? Surgical extraction   5. What date is procedure scheduled or is the patient there now? 12/23/19 at 1:30 PM (if the patient is at the dentist's office question goes to their cardiologist if he/she is in the office.  If not, question should go to the DOD).   6. What is your question (ex. Antibiotics prior to procedure, holding medication-we need to know how long dentist wants pt to hold med)? If medications need to be held prior. States it will be local anesthesia and minimal bleeding.

## 2019-12-21 NOTE — Telephone Encounter (Signed)
° °  Primary Cardiologist: Sinclair Grooms, MD  Chart reviewed as part of pre-operative protocol coverage.   Simple dental extractions (1 or 2 teeth) are considered low risk procedures per guidelines and generally do not require any specific cardiac clearance. It is also generally accepted that for simple extractions and dental cleanings, there is no need to interrupt blood thinner therapy.  If more than 2 teeth are being extracted, please notify our office with corrected information for proper clearance.   SBE prophylaxis is not required for the patient from a cardiac standpoint.  I will route this recommendation to the requesting party via Epic fax function and remove from pre-op pool.  Please call with questions.  Tami Lin Cylah Fannin, PA 12/21/2019, 1:16 PM

## 2019-12-24 ENCOUNTER — Ambulatory Visit (INDEPENDENT_AMBULATORY_CARE_PROVIDER_SITE_OTHER): Payer: Medicare Other

## 2019-12-24 ENCOUNTER — Encounter: Payer: Self-pay | Admitting: Obstetrics & Gynecology

## 2019-12-24 ENCOUNTER — Other Ambulatory Visit: Payer: Self-pay

## 2019-12-24 ENCOUNTER — Ambulatory Visit (INDEPENDENT_AMBULATORY_CARE_PROVIDER_SITE_OTHER): Payer: Medicare Other | Admitting: Obstetrics & Gynecology

## 2019-12-24 VITALS — BP 116/70 | HR 68 | Resp 16 | Wt 229.0 lb

## 2019-12-24 DIAGNOSIS — Z9189 Other specified personal risk factors, not elsewhere classified: Secondary | ICD-10-CM

## 2019-12-24 DIAGNOSIS — R1909 Other intra-abdominal and pelvic swelling, mass and lump: Secondary | ICD-10-CM | POA: Diagnosis not present

## 2019-12-24 DIAGNOSIS — Z1589 Genetic susceptibility to other disease: Secondary | ICD-10-CM | POA: Diagnosis not present

## 2019-12-24 DIAGNOSIS — I25118 Atherosclerotic heart disease of native coronary artery with other forms of angina pectoris: Secondary | ICD-10-CM

## 2019-12-24 DIAGNOSIS — R140 Abdominal distension (gaseous): Secondary | ICD-10-CM | POA: Diagnosis not present

## 2019-12-24 NOTE — Progress Notes (Signed)
69 y.o. G1P0010 Married [place] American female here for pelvic ultrasound due to h/o MLH1 gene mutation with increased risks of ovarian cancer.  Pt's had a lot of medical change in the past few years.  Has RA, uveitis after cataract surgery, cardiovascular disease, stent placements.  She is still on plavix and ASA.  Pt does not want to proceed with any surgery at this time and desires conservative management.  Fearful that she will have additional issues related to surgery.  Aware she does have increased lifetime risk of ovarian cancer.  Did have urine cytology 06/04/2019.  Patient's last menstrual period was 03/19/1998.  Contraception: Hysterectomy  Findings:  UTERUS: surgically absent, cervix is 3.5 x 3.0 x 3.2cm ADNEXA: Left ovary: 2.2 x 1.2 x 1.6cm       Right ovary: 2.4 x 1.4 x 2.2cm CUL DE SAC: no free fluid  Discussion:  Ultrasonographer supervised.  Images reviewed and findings discussed.  Will obtain ca-125 today.  Pt and I discussed risks and benefits of waiting for surgery.  She desires to wait.   Assessment:  Increased risk of ovarian cancer with MLH1 gene mutation (Lynch Syndrome) Bloating   Plan:  Ca-125 will be obtained today Repeat PUS, urine cytology and ca-125 in 6 months.  15 minutes spent with pt discussing findings, reviewing risks/benefits, and discuss follow up planning.

## 2019-12-25 LAB — CA 125: Cancer Antigen (CA) 125: 11.9 U/mL (ref 0.0–38.1)

## 2019-12-29 DIAGNOSIS — H209 Unspecified iridocyclitis: Secondary | ICD-10-CM | POA: Diagnosis not present

## 2019-12-29 DIAGNOSIS — H4041X3 Glaucoma secondary to eye inflammation, right eye, severe stage: Secondary | ICD-10-CM | POA: Diagnosis not present

## 2019-12-29 DIAGNOSIS — H4042X1 Glaucoma secondary to eye inflammation, left eye, mild stage: Secondary | ICD-10-CM | POA: Diagnosis not present

## 2019-12-30 ENCOUNTER — Other Ambulatory Visit: Payer: Self-pay | Admitting: *Deleted

## 2019-12-30 DIAGNOSIS — Z87891 Personal history of nicotine dependence: Secondary | ICD-10-CM

## 2020-01-08 DIAGNOSIS — Z23 Encounter for immunization: Secondary | ICD-10-CM | POA: Diagnosis not present

## 2020-01-14 DIAGNOSIS — L400 Psoriasis vulgaris: Secondary | ICD-10-CM | POA: Diagnosis not present

## 2020-01-18 DIAGNOSIS — R0982 Postnasal drip: Secondary | ICD-10-CM | POA: Diagnosis not present

## 2020-01-18 DIAGNOSIS — T7840XA Allergy, unspecified, initial encounter: Secondary | ICD-10-CM | POA: Diagnosis not present

## 2020-01-18 DIAGNOSIS — K219 Gastro-esophageal reflux disease without esophagitis: Secondary | ICD-10-CM | POA: Diagnosis not present

## 2020-01-21 ENCOUNTER — Ambulatory Visit: Payer: Medicare Other | Admitting: Obstetrics & Gynecology

## 2020-01-22 DIAGNOSIS — H209 Unspecified iridocyclitis: Secondary | ICD-10-CM | POA: Diagnosis not present

## 2020-01-22 DIAGNOSIS — Z6836 Body mass index (BMI) 36.0-36.9, adult: Secondary | ICD-10-CM | POA: Diagnosis not present

## 2020-01-22 DIAGNOSIS — M2550 Pain in unspecified joint: Secondary | ICD-10-CM | POA: Diagnosis not present

## 2020-01-22 DIAGNOSIS — E669 Obesity, unspecified: Secondary | ICD-10-CM | POA: Diagnosis not present

## 2020-01-22 DIAGNOSIS — Z79899 Other long term (current) drug therapy: Secondary | ICD-10-CM | POA: Diagnosis not present

## 2020-01-22 DIAGNOSIS — R768 Other specified abnormal immunological findings in serum: Secondary | ICD-10-CM | POA: Diagnosis not present

## 2020-01-22 DIAGNOSIS — L403 Pustulosis palmaris et plantaris: Secondary | ICD-10-CM | POA: Diagnosis not present

## 2020-01-22 DIAGNOSIS — M0589 Other rheumatoid arthritis with rheumatoid factor of multiple sites: Secondary | ICD-10-CM | POA: Diagnosis not present

## 2020-01-25 ENCOUNTER — Other Ambulatory Visit: Payer: Self-pay

## 2020-01-25 ENCOUNTER — Other Ambulatory Visit: Payer: Medicare Other | Admitting: *Deleted

## 2020-01-25 ENCOUNTER — Ambulatory Visit: Payer: Medicare Other | Admitting: Obstetrics & Gynecology

## 2020-01-25 ENCOUNTER — Ambulatory Visit: Payer: Medicare Other

## 2020-01-25 DIAGNOSIS — E782 Mixed hyperlipidemia: Secondary | ICD-10-CM | POA: Diagnosis not present

## 2020-01-25 DIAGNOSIS — I25118 Atherosclerotic heart disease of native coronary artery with other forms of angina pectoris: Secondary | ICD-10-CM

## 2020-01-25 LAB — HEPATIC FUNCTION PANEL
ALT: 27 IU/L (ref 0–32)
AST: 30 IU/L (ref 0–40)
Albumin: 4 g/dL (ref 3.8–4.8)
Alkaline Phosphatase: 53 IU/L (ref 44–121)
Bilirubin Total: 0.6 mg/dL (ref 0.0–1.2)
Bilirubin, Direct: 0.19 mg/dL (ref 0.00–0.40)
Total Protein: 6.5 g/dL (ref 6.0–8.5)

## 2020-01-25 LAB — LIPID PANEL
Chol/HDL Ratio: 2.7 ratio (ref 0.0–4.4)
Cholesterol, Total: 173 mg/dL (ref 100–199)
HDL: 65 mg/dL (ref >39)
LDL Chol Calc (NIH): 91 mg/dL (ref 0–99)
Triglycerides: 96 mg/dL (ref 0–149)
VLDL Cholesterol Cal: 17 mg/dL (ref 5–40)

## 2020-01-26 ENCOUNTER — Ambulatory Visit
Admission: RE | Admit: 2020-01-26 | Discharge: 2020-01-26 | Disposition: A | Discharge status: Home / Self Care | Payer: Medicare Other | Source: Ambulatory Visit | Attending: Acute Care | Admitting: Acute Care

## 2020-01-26 ENCOUNTER — Telehealth: Payer: Self-pay | Admitting: *Deleted

## 2020-01-26 DIAGNOSIS — J432 Centrilobular emphysema: Secondary | ICD-10-CM | POA: Diagnosis not present

## 2020-01-26 DIAGNOSIS — I2510 Atherosclerotic heart disease of native coronary artery without angina pectoris: Secondary | ICD-10-CM | POA: Diagnosis not present

## 2020-01-26 DIAGNOSIS — Z87891 Personal history of nicotine dependence: Secondary | ICD-10-CM | POA: Diagnosis not present

## 2020-01-26 DIAGNOSIS — K5730 Diverticulosis of large intestine without perforation or abscess without bleeding: Secondary | ICD-10-CM | POA: Diagnosis not present

## 2020-01-26 DIAGNOSIS — E782 Mixed hyperlipidemia: Secondary | ICD-10-CM

## 2020-01-26 MED ORDER — EZETIMIBE 10 MG PO TABS: 10.0000 mg | ORAL_TABLET | Freq: Every day | ORAL | 3 refills | Status: DC

## 2020-01-26 NOTE — Telephone Encounter (Signed)
Spoke with pt and made her aware of results and recommendations.  Pt states she has went to Lipid Clinic before and they tried to start her on Repatha but she was unable to afford it.  Also mentioned that she was previously on Zetia but it and the Rosuvastatin were stopped due to elevated liver enzymes.  Pt was told to restart Crestor but not the Zetia and forgot to mention it at her last appt.  Advised pt to go ahead and restart that and we will recheck labs in 6-8 weeks.  Scheduled labs for 03/21/20.  Pt agreeable to plan.

## 2020-01-26 NOTE — Telephone Encounter (Signed)
-----   Message from Belva Crome, MD sent at 01/25/2020  6:13 PM EST ----- Let the patient know the lipids are okay but 20 points above target. I know she was unable to tolerate max dose Crestor. I think there was liver enzyme elevation or muscle complaints. Need to start Ezetimibe. And / or see Pharm D in lipid clinic at [facility]. Target LDL < 70 but would like closer to 55. ? PCSK-9 start. A copy will be sent to [name], PA-C

## 2020-01-28 DIAGNOSIS — M19012 Primary osteoarthritis, left shoulder: Secondary | ICD-10-CM | POA: Diagnosis not present

## 2020-01-28 NOTE — Progress Notes (Signed)

## 2020-01-30 ENCOUNTER — Other Ambulatory Visit: Payer: Self-pay | Admitting: Interventional Cardiology

## 2020-02-01 ENCOUNTER — Other Ambulatory Visit: Payer: Self-pay | Admitting: *Deleted

## 2020-02-01 DIAGNOSIS — Z87891 Personal history of nicotine dependence: Secondary | ICD-10-CM

## 2020-02-01 DIAGNOSIS — L723 Sebaceous cyst: Secondary | ICD-10-CM | POA: Diagnosis not present

## 2020-02-05 DIAGNOSIS — M0589 Other rheumatoid arthritis with rheumatoid factor of multiple sites: Secondary | ICD-10-CM | POA: Diagnosis not present

## 2020-02-05 DIAGNOSIS — L02422 Furuncle of left axilla: Secondary | ICD-10-CM | POA: Diagnosis not present

## 2020-02-08 DIAGNOSIS — L400 Psoriasis vulgaris: Secondary | ICD-10-CM | POA: Diagnosis not present

## 2020-02-08 DIAGNOSIS — L02412 Cutaneous abscess of left axilla: Secondary | ICD-10-CM | POA: Diagnosis not present

## 2020-02-15 DIAGNOSIS — Z79899 Other long term (current) drug therapy: Secondary | ICD-10-CM | POA: Diagnosis not present

## 2020-02-15 DIAGNOSIS — M7732 Calcaneal spur, left foot: Secondary | ICD-10-CM | POA: Diagnosis not present

## 2020-02-15 DIAGNOSIS — R760 Raised antibody titer: Secondary | ICD-10-CM | POA: Diagnosis not present

## 2020-02-15 DIAGNOSIS — M19071 Primary osteoarthritis, right ankle and foot: Secondary | ICD-10-CM | POA: Diagnosis not present

## 2020-02-15 DIAGNOSIS — I7090 Unspecified atherosclerosis: Secondary | ICD-10-CM | POA: Diagnosis not present

## 2020-02-15 DIAGNOSIS — M19042 Primary osteoarthritis, left hand: Secondary | ICD-10-CM | POA: Diagnosis not present

## 2020-02-15 DIAGNOSIS — M19041 Primary osteoarthritis, right hand: Secondary | ICD-10-CM | POA: Diagnosis not present

## 2020-02-15 DIAGNOSIS — Z87891 Personal history of nicotine dependence: Secondary | ICD-10-CM | POA: Diagnosis not present

## 2020-02-15 DIAGNOSIS — H209 Unspecified iridocyclitis: Secondary | ICD-10-CM | POA: Diagnosis not present

## 2020-02-15 DIAGNOSIS — R768 Other specified abnormal immunological findings in serum: Secondary | ICD-10-CM | POA: Diagnosis not present

## 2020-02-15 DIAGNOSIS — R21 Rash and other nonspecific skin eruption: Secondary | ICD-10-CM | POA: Diagnosis not present

## 2020-02-15 DIAGNOSIS — M19072 Primary osteoarthritis, left ankle and foot: Secondary | ICD-10-CM | POA: Diagnosis not present

## 2020-02-15 DIAGNOSIS — Q7032 Webbed toes, left foot: Secondary | ICD-10-CM | POA: Diagnosis not present

## 2020-02-17 DIAGNOSIS — Z111 Encounter for screening for respiratory tuberculosis: Secondary | ICD-10-CM | POA: Diagnosis not present

## 2020-02-17 DIAGNOSIS — R5383 Other fatigue: Secondary | ICD-10-CM | POA: Diagnosis not present

## 2020-02-17 DIAGNOSIS — M0589 Other rheumatoid arthritis with rheumatoid factor of multiple sites: Secondary | ICD-10-CM | POA: Diagnosis not present

## 2020-03-09 DIAGNOSIS — M19012 Primary osteoarthritis, left shoulder: Secondary | ICD-10-CM | POA: Diagnosis not present

## 2020-03-21 ENCOUNTER — Other Ambulatory Visit: Payer: Self-pay

## 2020-03-21 ENCOUNTER — Other Ambulatory Visit: Payer: Medicare Other

## 2020-03-21 DIAGNOSIS — E782 Mixed hyperlipidemia: Secondary | ICD-10-CM

## 2020-03-21 LAB — HEPATIC FUNCTION PANEL
ALT: 65 IU/L — ABNORMAL HIGH (ref 0–32)
AST: 43 IU/L — ABNORMAL HIGH (ref 0–40)
Albumin: 4 g/dL (ref 3.8–4.8)
Alkaline Phosphatase: 55 IU/L (ref 44–121)
Bilirubin Total: 0.6 mg/dL (ref 0.0–1.2)
Bilirubin, Direct: 0.19 mg/dL (ref 0.00–0.40)
Total Protein: 6.5 g/dL (ref 6.0–8.5)

## 2020-03-21 LAB — LIPID PANEL
Chol/HDL Ratio: 2.2 ratio (ref 0.0–4.4)
Cholesterol, Total: 175 mg/dL (ref 100–199)
HDL: 79 mg/dL (ref >39)
LDL Chol Calc (NIH): 78 mg/dL (ref 0–99)
Triglycerides: 103 mg/dL (ref 0–149)
VLDL Cholesterol Cal: 18 mg/dL (ref 5–40)

## 2020-03-23 ENCOUNTER — Other Ambulatory Visit: Payer: Self-pay | Admitting: *Deleted

## 2020-03-23 DIAGNOSIS — R748 Abnormal levels of other serum enzymes: Secondary | ICD-10-CM

## 2020-03-25 DIAGNOSIS — M25512 Pain in left shoulder: Secondary | ICD-10-CM | POA: Diagnosis not present

## 2020-04-05 ENCOUNTER — Other Ambulatory Visit: Payer: Medicare Other

## 2020-04-07 ENCOUNTER — Other Ambulatory Visit: Payer: Self-pay

## 2020-04-07 DIAGNOSIS — Z20822 Contact with and (suspected) exposure to covid-19: Secondary | ICD-10-CM | POA: Diagnosis not present

## 2020-04-09 LAB — SARS-COV-2, NAA 2 DAY TAT

## 2020-04-09 LAB — NOVEL CORONAVIRUS, NAA: SARS-CoV-2, NAA: NOT DETECTED

## 2020-04-14 DIAGNOSIS — M0589 Other rheumatoid arthritis with rheumatoid factor of multiple sites: Secondary | ICD-10-CM | POA: Diagnosis not present

## 2020-04-14 DIAGNOSIS — I10 Essential (primary) hypertension: Secondary | ICD-10-CM | POA: Diagnosis not present

## 2020-04-14 DIAGNOSIS — R635 Abnormal weight gain: Secondary | ICD-10-CM | POA: Diagnosis not present

## 2020-04-14 DIAGNOSIS — Z79899 Other long term (current) drug therapy: Secondary | ICD-10-CM | POA: Diagnosis not present

## 2020-04-19 DIAGNOSIS — J3489 Other specified disorders of nose and nasal sinuses: Secondary | ICD-10-CM | POA: Diagnosis not present

## 2020-04-19 DIAGNOSIS — R0982 Postnasal drip: Secondary | ICD-10-CM | POA: Diagnosis not present

## 2020-04-22 DIAGNOSIS — J320 Chronic maxillary sinusitis: Secondary | ICD-10-CM | POA: Diagnosis not present

## 2020-04-22 DIAGNOSIS — J329 Chronic sinusitis, unspecified: Secondary | ICD-10-CM | POA: Diagnosis not present

## 2020-04-22 DIAGNOSIS — R0982 Postnasal drip: Secondary | ICD-10-CM | POA: Diagnosis not present

## 2020-04-22 DIAGNOSIS — J3489 Other specified disorders of nose and nasal sinuses: Secondary | ICD-10-CM | POA: Diagnosis not present

## 2020-04-22 DIAGNOSIS — J0100 Acute maxillary sinusitis, unspecified: Secondary | ICD-10-CM | POA: Diagnosis not present

## 2020-04-22 DIAGNOSIS — J321 Chronic frontal sinusitis: Secondary | ICD-10-CM | POA: Diagnosis not present

## 2020-04-26 ENCOUNTER — Other Ambulatory Visit: Payer: Medicare Other

## 2020-04-26 ENCOUNTER — Other Ambulatory Visit: Payer: Self-pay

## 2020-04-26 DIAGNOSIS — R748 Abnormal levels of other serum enzymes: Secondary | ICD-10-CM

## 2020-04-26 LAB — HEPATIC FUNCTION PANEL
ALT: 35 IU/L — ABNORMAL HIGH (ref 0–32)
AST: 32 IU/L (ref 0–40)
Albumin: 3.9 g/dL (ref 3.8–4.8)
Alkaline Phosphatase: 54 IU/L (ref 44–121)
Bilirubin Total: 0.7 mg/dL (ref 0.0–1.2)
Bilirubin, Direct: 0.24 mg/dL (ref 0.00–0.40)
Total Protein: 6.4 g/dL (ref 6.0–8.5)

## 2020-04-27 DIAGNOSIS — M0589 Other rheumatoid arthritis with rheumatoid factor of multiple sites: Secondary | ICD-10-CM | POA: Diagnosis not present

## 2020-04-27 DIAGNOSIS — Z79899 Other long term (current) drug therapy: Secondary | ICD-10-CM | POA: Diagnosis not present

## 2020-04-27 DIAGNOSIS — M2550 Pain in unspecified joint: Secondary | ICD-10-CM | POA: Diagnosis not present

## 2020-04-27 DIAGNOSIS — L403 Pustulosis palmaris et plantaris: Secondary | ICD-10-CM | POA: Diagnosis not present

## 2020-04-27 DIAGNOSIS — Z6837 Body mass index (BMI) 37.0-37.9, adult: Secondary | ICD-10-CM | POA: Diagnosis not present

## 2020-04-27 DIAGNOSIS — H209 Unspecified iridocyclitis: Secondary | ICD-10-CM | POA: Diagnosis not present

## 2020-04-27 DIAGNOSIS — E669 Obesity, unspecified: Secondary | ICD-10-CM | POA: Diagnosis not present

## 2020-05-02 DIAGNOSIS — Z7901 Long term (current) use of anticoagulants: Secondary | ICD-10-CM | POA: Diagnosis not present

## 2020-05-02 DIAGNOSIS — M1612 Unilateral primary osteoarthritis, left hip: Secondary | ICD-10-CM | POA: Diagnosis not present

## 2020-05-02 DIAGNOSIS — H209 Unspecified iridocyclitis: Secondary | ICD-10-CM | POA: Diagnosis not present

## 2020-05-02 DIAGNOSIS — M533 Sacrococcygeal disorders, not elsewhere classified: Secondary | ICD-10-CM | POA: Diagnosis not present

## 2020-05-02 DIAGNOSIS — M25552 Pain in left hip: Secondary | ICD-10-CM | POA: Diagnosis not present

## 2020-05-02 DIAGNOSIS — Z79899 Other long term (current) drug therapy: Secondary | ICD-10-CM | POA: Diagnosis not present

## 2020-05-05 DIAGNOSIS — Z6837 Body mass index (BMI) 37.0-37.9, adult: Secondary | ICD-10-CM | POA: Diagnosis not present

## 2020-05-05 DIAGNOSIS — R635 Abnormal weight gain: Secondary | ICD-10-CM | POA: Diagnosis not present

## 2020-05-05 DIAGNOSIS — T50905A Adverse effect of unspecified drugs, medicaments and biological substances, initial encounter: Secondary | ICD-10-CM | POA: Diagnosis not present

## 2020-05-05 DIAGNOSIS — I10 Essential (primary) hypertension: Secondary | ICD-10-CM | POA: Diagnosis not present

## 2020-05-05 DIAGNOSIS — R001 Bradycardia, unspecified: Secondary | ICD-10-CM | POA: Diagnosis not present

## 2020-05-10 DIAGNOSIS — H4042X1 Glaucoma secondary to eye inflammation, left eye, mild stage: Secondary | ICD-10-CM | POA: Diagnosis not present

## 2020-05-10 DIAGNOSIS — H209 Unspecified iridocyclitis: Secondary | ICD-10-CM | POA: Diagnosis not present

## 2020-05-10 DIAGNOSIS — H4041X3 Glaucoma secondary to eye inflammation, right eye, severe stage: Secondary | ICD-10-CM | POA: Diagnosis not present

## 2020-05-13 DIAGNOSIS — M79605 Pain in left leg: Secondary | ICD-10-CM | POA: Diagnosis not present

## 2020-05-16 ENCOUNTER — Other Ambulatory Visit (HOSPITAL_COMMUNITY): Payer: Self-pay | Admitting: Family Medicine

## 2020-05-16 ENCOUNTER — Ambulatory Visit (HOSPITAL_COMMUNITY)
Admission: RE | Admit: 2020-05-16 | Discharge: 2020-05-16 | Disposition: A | Discharge status: Home / Self Care | Payer: Medicare Other | Source: Ambulatory Visit | Attending: Family Medicine | Admitting: Family Medicine

## 2020-05-16 ENCOUNTER — Other Ambulatory Visit: Payer: Self-pay

## 2020-05-16 DIAGNOSIS — M7989 Other specified soft tissue disorders: Secondary | ICD-10-CM

## 2020-05-16 DIAGNOSIS — M79669 Pain in unspecified lower leg: Secondary | ICD-10-CM

## 2020-05-16 DIAGNOSIS — M79605 Pain in left leg: Secondary | ICD-10-CM

## 2020-05-19 DIAGNOSIS — M25552 Pain in left hip: Secondary | ICD-10-CM | POA: Diagnosis not present

## 2020-05-20 DIAGNOSIS — E669 Obesity, unspecified: Secondary | ICD-10-CM | POA: Diagnosis not present

## 2020-05-20 DIAGNOSIS — Z6835 Body mass index (BMI) 35.0-35.9, adult: Secondary | ICD-10-CM | POA: Diagnosis not present

## 2020-05-26 DIAGNOSIS — Z6835 Body mass index (BMI) 35.0-35.9, adult: Secondary | ICD-10-CM | POA: Diagnosis not present

## 2020-05-26 DIAGNOSIS — E669 Obesity, unspecified: Secondary | ICD-10-CM | POA: Diagnosis not present

## 2020-05-30 DIAGNOSIS — Z6837 Body mass index (BMI) 37.0-37.9, adult: Secondary | ICD-10-CM | POA: Diagnosis not present

## 2020-06-06 DIAGNOSIS — E6609 Other obesity due to excess calories: Secondary | ICD-10-CM | POA: Diagnosis not present

## 2020-06-07 ENCOUNTER — Telehealth: Payer: Self-pay | Admitting: Hematology

## 2020-06-07 DIAGNOSIS — L810 Postinflammatory hyperpigmentation: Secondary | ICD-10-CM | POA: Diagnosis not present

## 2020-06-07 DIAGNOSIS — L403 Pustulosis palmaris et plantaris: Secondary | ICD-10-CM | POA: Diagnosis not present

## 2020-06-07 NOTE — Telephone Encounter (Signed)
Scheduled per 7/7 los. Mailing appt letter and calendar printout

## 2020-06-08 DIAGNOSIS — E669 Obesity, unspecified: Secondary | ICD-10-CM | POA: Diagnosis not present

## 2020-06-08 DIAGNOSIS — Z6835 Body mass index (BMI) 35.0-35.9, adult: Secondary | ICD-10-CM | POA: Diagnosis not present

## 2020-06-13 ENCOUNTER — Ambulatory Visit: Payer: Medicare Other | Admitting: Endocrinology

## 2020-06-15 DIAGNOSIS — H4042X3 Glaucoma secondary to eye inflammation, left eye, severe stage: Secondary | ICD-10-CM | POA: Diagnosis not present

## 2020-06-15 DIAGNOSIS — H209 Unspecified iridocyclitis: Secondary | ICD-10-CM | POA: Diagnosis not present

## 2020-06-15 DIAGNOSIS — H4041X3 Glaucoma secondary to eye inflammation, right eye, severe stage: Secondary | ICD-10-CM | POA: Diagnosis not present

## 2020-06-16 DIAGNOSIS — M0589 Other rheumatoid arthritis with rheumatoid factor of multiple sites: Secondary | ICD-10-CM | POA: Diagnosis not present

## 2020-06-16 DIAGNOSIS — M25552 Pain in left hip: Secondary | ICD-10-CM | POA: Diagnosis not present

## 2020-06-16 DIAGNOSIS — Z79899 Other long term (current) drug therapy: Secondary | ICD-10-CM | POA: Diagnosis not present

## 2020-07-04 DIAGNOSIS — E669 Obesity, unspecified: Secondary | ICD-10-CM | POA: Diagnosis not present

## 2020-07-04 DIAGNOSIS — Z6834 Body mass index (BMI) 34.0-34.9, adult: Secondary | ICD-10-CM | POA: Diagnosis not present

## 2020-07-07 ENCOUNTER — Ambulatory Visit: Payer: Medicare Other | Admitting: Obstetrics and Gynecology

## 2020-07-18 DIAGNOSIS — E669 Obesity, unspecified: Secondary | ICD-10-CM | POA: Diagnosis not present

## 2020-07-18 DIAGNOSIS — Z6834 Body mass index (BMI) 34.0-34.9, adult: Secondary | ICD-10-CM | POA: Diagnosis not present

## 2020-07-25 DIAGNOSIS — R202 Paresthesia of skin: Secondary | ICD-10-CM | POA: Diagnosis not present

## 2020-07-29 DIAGNOSIS — H401133 Primary open-angle glaucoma, bilateral, severe stage: Secondary | ICD-10-CM | POA: Diagnosis not present

## 2020-07-29 DIAGNOSIS — H20023 Recurrent acute iridocyclitis, bilateral: Secondary | ICD-10-CM | POA: Diagnosis not present

## 2020-08-01 ENCOUNTER — Other Ambulatory Visit: Payer: Self-pay

## 2020-08-01 ENCOUNTER — Other Ambulatory Visit (HOSPITAL_BASED_OUTPATIENT_CLINIC_OR_DEPARTMENT_OTHER): Payer: Self-pay

## 2020-08-01 ENCOUNTER — Ambulatory Visit: Payer: Medicare Other | Attending: Internal Medicine

## 2020-08-01 DIAGNOSIS — R202 Paresthesia of skin: Secondary | ICD-10-CM | POA: Diagnosis not present

## 2020-08-01 DIAGNOSIS — L409 Psoriasis, unspecified: Secondary | ICD-10-CM | POA: Diagnosis not present

## 2020-08-01 DIAGNOSIS — E669 Obesity, unspecified: Secondary | ICD-10-CM | POA: Diagnosis not present

## 2020-08-01 DIAGNOSIS — Z23 Encounter for immunization: Secondary | ICD-10-CM

## 2020-08-01 DIAGNOSIS — R200 Anesthesia of skin: Secondary | ICD-10-CM | POA: Diagnosis not present

## 2020-08-01 DIAGNOSIS — Z79899 Other long term (current) drug therapy: Secondary | ICD-10-CM | POA: Diagnosis not present

## 2020-08-01 DIAGNOSIS — Z6835 Body mass index (BMI) 35.0-35.9, adult: Secondary | ICD-10-CM | POA: Diagnosis not present

## 2020-08-01 DIAGNOSIS — Z888 Allergy status to other drugs, medicaments and biological substances status: Secondary | ICD-10-CM | POA: Diagnosis not present

## 2020-08-01 DIAGNOSIS — H209 Unspecified iridocyclitis: Secondary | ICD-10-CM | POA: Diagnosis not present

## 2020-08-01 DIAGNOSIS — Z881 Allergy status to other antibiotic agents status: Secondary | ICD-10-CM | POA: Diagnosis not present

## 2020-08-01 DIAGNOSIS — Z5181 Encounter for therapeutic drug level monitoring: Secondary | ICD-10-CM | POA: Diagnosis not present

## 2020-08-01 DIAGNOSIS — M47819 Spondylosis without myelopathy or radiculopathy, site unspecified: Secondary | ICD-10-CM | POA: Diagnosis not present

## 2020-08-01 MED ORDER — PFIZER-BIONT COVID-19 VAC-TRIS 30 MCG/0.3ML IM SUSP
INTRAMUSCULAR | 0 refills | Status: DC
  Filled 2020-08-01: qty 0.3, 1d supply, fill #0

## 2020-08-01 NOTE — Progress Notes (Signed)
   Covid-19 Vaccination Clinic  Name:  NANCEY KREITZ    MRN: [id number] DOB: [birth year]  08/01/2020  Ms. Goodchild was observed post Covid-19 immunization for 15 minutes without incident. She was provided with Vaccine Information Sheet and instruction to access the V-Safe system.   Ms. Cisnero was instructed to call 911 with any severe reactions post vaccine: [name] Difficulty breathing  . Swelling of face and throat  . A fast heartbeat  . A bad rash all over body  . Dizziness and weakness   Immunizations Administered    Name Date Dose VIS Date Route   PFIZER Comrnaty(Gray TOP) Covid-19 Vaccine 08/01/2020 10:46 AM 0.3 mL 02/25/2020 Intramuscular   Manufacturer: [place]   Lot: [id number]   NDC: [phone]

## 2020-08-02 DIAGNOSIS — Z79899 Other long term (current) drug therapy: Secondary | ICD-10-CM | POA: Diagnosis not present

## 2020-08-02 DIAGNOSIS — L409 Psoriasis, unspecified: Secondary | ICD-10-CM | POA: Diagnosis not present

## 2020-08-02 DIAGNOSIS — M47819 Spondylosis without myelopathy or radiculopathy, site unspecified: Secondary | ICD-10-CM | POA: Diagnosis not present

## 2020-08-02 DIAGNOSIS — H209 Unspecified iridocyclitis: Secondary | ICD-10-CM | POA: Diagnosis not present

## 2020-08-09 DIAGNOSIS — H209 Unspecified iridocyclitis: Secondary | ICD-10-CM | POA: Diagnosis not present

## 2020-08-09 DIAGNOSIS — H4042X1 Glaucoma secondary to eye inflammation, left eye, mild stage: Secondary | ICD-10-CM | POA: Diagnosis not present

## 2020-08-09 DIAGNOSIS — H4041X3 Glaucoma secondary to eye inflammation, right eye, severe stage: Secondary | ICD-10-CM | POA: Diagnosis not present

## 2020-08-10 DIAGNOSIS — Z6835 Body mass index (BMI) 35.0-35.9, adult: Secondary | ICD-10-CM | POA: Diagnosis not present

## 2020-08-10 DIAGNOSIS — M0589 Other rheumatoid arthritis with rheumatoid factor of multiple sites: Secondary | ICD-10-CM | POA: Diagnosis not present

## 2020-08-10 DIAGNOSIS — M792 Neuralgia and neuritis, unspecified: Secondary | ICD-10-CM | POA: Diagnosis not present

## 2020-08-10 DIAGNOSIS — L403 Pustulosis palmaris et plantaris: Secondary | ICD-10-CM | POA: Diagnosis not present

## 2020-08-10 DIAGNOSIS — E669 Obesity, unspecified: Secondary | ICD-10-CM | POA: Diagnosis not present

## 2020-08-10 DIAGNOSIS — Z79899 Other long term (current) drug therapy: Secondary | ICD-10-CM | POA: Diagnosis not present

## 2020-08-10 DIAGNOSIS — H209 Unspecified iridocyclitis: Secondary | ICD-10-CM | POA: Diagnosis not present

## 2020-08-11 DIAGNOSIS — M0589 Other rheumatoid arthritis with rheumatoid factor of multiple sites: Secondary | ICD-10-CM | POA: Diagnosis not present

## 2020-08-11 DIAGNOSIS — Z79899 Other long term (current) drug therapy: Secondary | ICD-10-CM | POA: Diagnosis not present

## 2020-08-16 DIAGNOSIS — Z6835 Body mass index (BMI) 35.0-35.9, adult: Secondary | ICD-10-CM | POA: Diagnosis not present

## 2020-08-16 DIAGNOSIS — E669 Obesity, unspecified: Secondary | ICD-10-CM | POA: Diagnosis not present

## 2020-08-20 ENCOUNTER — Other Ambulatory Visit: Payer: Self-pay | Admitting: Interventional Cardiology

## 2020-08-25 DIAGNOSIS — M5412 Radiculopathy, cervical region: Secondary | ICD-10-CM | POA: Diagnosis not present

## 2020-08-25 DIAGNOSIS — M542 Cervicalgia: Secondary | ICD-10-CM | POA: Diagnosis not present

## 2020-08-25 DIAGNOSIS — M19019 Primary osteoarthritis, unspecified shoulder: Secondary | ICD-10-CM | POA: Diagnosis not present

## 2020-08-30 DIAGNOSIS — M5030 Other cervical disc degeneration, unspecified cervical region: Secondary | ICD-10-CM | POA: Diagnosis not present

## 2020-08-30 DIAGNOSIS — M5412 Radiculopathy, cervical region: Secondary | ICD-10-CM | POA: Diagnosis not present

## 2020-09-20 DIAGNOSIS — E669 Obesity, unspecified: Secondary | ICD-10-CM | POA: Diagnosis not present

## 2020-09-20 DIAGNOSIS — Z6835 Body mass index (BMI) 35.0-35.9, adult: Secondary | ICD-10-CM | POA: Diagnosis not present

## 2020-09-26 DIAGNOSIS — M5412 Radiculopathy, cervical region: Secondary | ICD-10-CM | POA: Diagnosis not present

## 2020-09-28 DIAGNOSIS — M5412 Radiculopathy, cervical region: Secondary | ICD-10-CM | POA: Diagnosis not present

## 2020-10-03 DIAGNOSIS — M5412 Radiculopathy, cervical region: Secondary | ICD-10-CM | POA: Diagnosis not present

## 2020-10-06 DIAGNOSIS — M0589 Other rheumatoid arthritis with rheumatoid factor of multiple sites: Secondary | ICD-10-CM | POA: Diagnosis not present

## 2020-10-06 DIAGNOSIS — Z79899 Other long term (current) drug therapy: Secondary | ICD-10-CM | POA: Diagnosis not present

## 2020-10-07 DIAGNOSIS — M5412 Radiculopathy, cervical region: Secondary | ICD-10-CM | POA: Diagnosis not present

## 2020-10-10 DIAGNOSIS — Z6835 Body mass index (BMI) 35.0-35.9, adult: Secondary | ICD-10-CM | POA: Diagnosis not present

## 2020-10-10 DIAGNOSIS — E669 Obesity, unspecified: Secondary | ICD-10-CM | POA: Diagnosis not present

## 2020-10-11 DIAGNOSIS — M5412 Radiculopathy, cervical region: Secondary | ICD-10-CM | POA: Diagnosis not present

## 2020-10-14 DIAGNOSIS — M5412 Radiculopathy, cervical region: Secondary | ICD-10-CM | POA: Diagnosis not present

## 2020-10-19 DIAGNOSIS — M5412 Radiculopathy, cervical region: Secondary | ICD-10-CM | POA: Diagnosis not present

## 2020-10-20 ENCOUNTER — Other Ambulatory Visit: Payer: Self-pay | Admitting: Interventional Cardiology

## 2020-10-20 ENCOUNTER — Ambulatory Visit (INDEPENDENT_AMBULATORY_CARE_PROVIDER_SITE_OTHER): Payer: Medicare Other | Admitting: Neurology

## 2020-10-20 ENCOUNTER — Telehealth: Payer: Self-pay | Admitting: Neurology

## 2020-10-20 ENCOUNTER — Encounter: Payer: Self-pay | Admitting: Neurology

## 2020-10-20 VITALS — BP 133/81 | HR 59 | Ht 67.0 in | Wt 222.0 lb

## 2020-10-20 DIAGNOSIS — E236 Other disorders of pituitary gland: Secondary | ICD-10-CM | POA: Diagnosis not present

## 2020-10-20 DIAGNOSIS — G8929 Other chronic pain: Secondary | ICD-10-CM

## 2020-10-20 DIAGNOSIS — M5412 Radiculopathy, cervical region: Secondary | ICD-10-CM | POA: Diagnosis not present

## 2020-10-20 DIAGNOSIS — H5340 Unspecified visual field defects: Secondary | ICD-10-CM

## 2020-10-20 DIAGNOSIS — M25512 Pain in left shoulder: Secondary | ICD-10-CM

## 2020-10-20 MED ORDER — GABAPENTIN 300 MG PO CAPS: 300.0000 mg | ORAL_CAPSULE | Freq: Three times a day (TID) | ORAL | 5 refills | Status: DC

## 2020-10-20 NOTE — Progress Notes (Signed)
[facility]  PATIENT: Pamela Lowe DOB: Jan 29, 1951  REFERRING DOCTOR OR PCP: Lona Kettle MD; Lazaro Arms, PA-C; Dr. Theda Sers [facility]) SOURCE: Patient, notes from PCP, imaging reports, MRI images personally reviewed.  _________________________________   HISTORICAL  CHIEF COMPLAINT:  Chief Complaint  Patient presents with   New Patient (Initial Visit)    New patient:  Left shoulder/arm pain EMG2, alone in room     HISTORY OF PRESENT ILLNESS:  I had the pleasure of seeing your patient, Pamela Lowe, at [facility] for neurologic consultation regarding her left shoulder and arm pain and left arm numbness.  She is a 70 year ol woman who has had left shoulder and upper arm pain with tingling going all the way to the fingers f since the beginning of 2022.   Due to continued pain she saw [facility] (they see her for her hip issues).   She had an MRI performed 08/25/2020 that shows severe bilateral neural foraminal narrowing at C4C5 affecting the C5 nerve roots and changes at C5-C6 that could affect the left C6 nerve root.   Although pain is more shoulder and upper arm, the numbness goes into her thumb more than the other fingers.   Looking to the right increases her pain.  She notes in general that different positions of her neck and arm where either increase or decrease the pain..  She received a left shoulder injection in January 2022 and felt she had partial benefit for several months but the numbness and tingling continued.  While reviewing the MRI of the cervical spine and noting the significant degenerative changes at C4-C5 and C5-C6, I also observed that the pituitary gland was greatly enlarged and extended outside of the sella turcica with potential to affect the optic nerves.  She does report visual changes that have worsened over the last several years.  Because she has uveitis and glaucoma, visual changes were felt to be related  to her existing problems.  She denies galactorrhea.  No headaches.  Imaging reviewed today I personally reviewed the MRI of the cervical spine 08/25/2020.  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  The spine shows large disc osteophyte complexes at C4-C5 and C5-C6.  At C4-C5, there is probable left and possible right C5 nerve root compression.  At C5-C6, there is potential left C6 nerve root compression.  There is also minimal retrolisthesis of C5 upon C6.  There are milder degenerative changes at C6-C7 more to the left that probably do not cause any nerve root compression.  Due to the large pituitary gland I also looked at the CT scan from 08/02/2019.  The pituitary gland was enlarged at that time as well.   REVIEW OF SYSTEMS: Constitutional: No fevers, chills, sweats, or change in appetite Eyes: No visual changes, double vision, eye pain Ear, nose and throat: No hearing loss, ear pain, nasal congestion, sore throat Cardiovascular: No chest pain, palpitations Respiratory:  No shortness of breath at rest or with exertion.   No wheezes GastrointestinaI: No nausea, vomiting, diarrhea, abdominal pain, fecal incontinence Genitourinary:  No dysuria, urinary retention or frequency.  No nocturia. Musculoskeletal:  No neck pain, back pain Integumentary: No rash, pruritus, skin lesions Neurological: as above Psychiatric: No depression at this time.  No anxiety Endocrine: No palpitations, diaphoresis, change in appetite, change in weigh or increased thirst Hematologic/Lymphatic:  No anemia, purpura, petechiae. Allergic/Immunologic: No itchy/runny eyes, nasal congestion, recent allergic reactions, rashes  ALLERGIES: Allergies  Allergen Reactions   Sulfamethoxazole-Trimethoprim Other (See Comments) and Rash    Chills/achy Flu like symptoms    Wellbutrin [Bupropion Hcl] Rash    rash    HOME MEDICATIONS:  Current Outpatient Medications:    aspirin 81 MG EC tablet, Take  81 mg by mouth every evening. , Disp: , Rfl:    benazepril (LOTENSIN) 20 MG tablet, Take 1 tablet by mouth once daily, Disp: 30 tablet, Rfl: 10   brimonidine (ALPHAGAN) 0.2 % ophthalmic solution, Place 1 drop into both eyes 3 (three) times daily. , Disp: , Rfl:    Calcium Carb-Cholecalciferol (CALCIUM-VITAMIN D) 600-400 MG-UNIT TABS, Take 1 tablet by mouth daily., Disp: , Rfl:    clopidogrel (PLAVIX) 75 MG tablet, TAKE ONE TABLET BY MOUTH DAILY, Disp: 90 tablet, Rfl: 3   COLLAGEN-VITAMIN C-BIOTIN PO, Take by mouth., Disp: , Rfl:    COVID-19 mRNA Vac-TriS, Pfizer, (PFIZER-BIONT COVID-19 VAC-TRIS) SUSP injection, Inject into the muscle., Disp: 0.3 mL, Rfl: 0   dorzolamide-timolol (COSOPT) 22.3-6.8 MG/ML ophthalmic solution, Place 1 drop into the right eye 2 (two) times daily., Disp: , Rfl:    ezetimibe (ZETIA) 10 MG tablet, Take 1 tablet (10 mg total) by mouth daily., Disp: 90 tablet, Rfl: 3   fexofenadine (ALLEGRA) 60 MG tablet, Take 60 mg by mouth as needed for allergies or rhinitis., Disp: , Rfl:    folic acid (FOLVITE) 1 MG tablet, Take 1 mg by mouth daily. , Disp: , Rfl:    hydrochlorothiazide (MICROZIDE) 12.5 MG capsule, Take 1 capsule (12.5 mg total) by mouth every Monday, Wednesday, and Friday., Disp: 45 capsule, Rfl: 3   ipratropium (ATROVENT) 0.06 % nasal spray, Place 2 sprays into both nostrils 3 (three) times daily., Disp: , Rfl:    methotrexate (RHEUMATREX) 2.5 MG tablet, Take by mouth once a week. Mondays Take 6 tablets, Disp: , Rfl:    metoprolol succinate (TOPROL-XL) 25 MG 24 hr tablet, Take 1 tablet by mouth once daily, Disp: 90 tablet, Rfl: 0   Multiple Vitamins-Minerals (MULTIVITAMIN WITH MINERALS) tablet, Take 1 tablet by mouth daily., Disp: , Rfl:    Omega-3 Fatty Acids (FISH OIL) 1000 MG CAPS, Take 1 capsule by mouth daily. , Disp: , Rfl:    pantoprazole (PROTONIX) 40 MG tablet, TAKE ONE TABLET BY MOUTH DAILY, Disp: 90 tablet, Rfl: 3   rosuvastatin (CRESTOR) 20 MG tablet, Take 1  tablet (20 mg total) by mouth daily., Disp: 90 tablet, Rfl: 3   SIMPONI ARIA 50 MG/4ML SOLN injection, Inject 50 mg into the vein. Every 60 Days, Disp: , Rfl:    amoxicillin (AMOXIL) 500 MG capsule, Take 500 mg by mouth 3 (three) times daily., Disp: , Rfl:    clindamycin (CLEOCIN T) 1 % lotion, Apply 1 application topically daily., Disp: , Rfl:    dorzolamide (TRUSOPT) 2 % ophthalmic solution, Place 1 drop into both eyes 2 (two) times daily. , Disp: , Rfl:    HYDROcodone-acetaminophen (NORCO/VICODIN) 5-325 MG tablet, Take 1 tablet by mouth every 6 (six) hours as needed., Disp: , Rfl:    predniSONE (DELTASONE) 1 MG tablet, Take 3 mg by mouth daily. 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   MLH1 [id number] (Splice site)   Multinodular goiter    Pustular psoriasis    Tobacco user     PAST SURGICAL HISTORY: Past Surgical History:  Procedure Laterality Date   BREAST BIOPSY Bilateral 1985   CARDIAC CATHETERIZATION     CATARACT EXTRACTION Right 07/2017   COLONOSCOPY     CORONARY STENT INTERVENTION N/A 12/26/2018   Procedure: CORONARY STENT INTERVENTION;  Surgeon: Belva Crome, MD;  Location: [place] [facility];  Service: Cardiovascular;  Laterality: N/A;   LEFT HEART CATH AND CORONARY ANGIOGRAPHY N/A 12/17/2018   Procedure: LEFT HEART CATH AND CORONARY ANGIOGRAPHY;  Surgeon: Belva Crome, MD;  Location: [place] [facility];  Service: Cardiovascular;  Laterality: N/A;   SUPRACERVICAL ABDOMINAL HYSTERECTOMY  1986   h/o supracervical hysterectomy   UPPER GASTROINTESTINAL ENDOSCOPY      FAMILY HISTORY: Family History  Problem Relation Age of Onset   Kidney cancer Father 64   Pneumonia Father    Hypertension Father    Hypertension Mother    Deep vein thrombosis Mother    Alzheimer's disease Mother    Osteopenia Mother    Heart attack Maternal Grandfather    Breast cancer Sister 39       recurrence 17   Allergies Brother    Breast cancer Sister 63       Stage 0   Breast cancer Sister 11       Stage 3, bilateral mastectomy   Heart attack Brother 43   Deep vein thrombosis Sister            Pancreatic cancer Paternal Aunt 79   Breast cancer Maternal Aunt 67   Prostate cancer Maternal Uncle 68   Pancreatic cancer Paternal Uncle 40   Uterine cancer Paternal Grandmother 65   Colon cancer Paternal Grandfather 61   Pancreatic cancer Paternal Aunt 24   Lung cancer Paternal Uncle 54   Brain cancer Paternal Aunt    Kidney cancer Cousin 69   Kidney cancer Cousin 52   Uterine cancer Cousin 62   Prostate cancer Cousin 57   Lung cancer Maternal Uncle 60   Breast cancer Cousin 60   Thyroid disease Neg Hx     SOCIAL HISTORY:  Social History   Socioeconomic History   Marital status: Married    Spouse name: [name]   Number of children: 0   Years of education: Not on file   Highest education level: Master's degree (e.g., MA, MS, MEng, MEd, MSW, MBA)  Occupational History   Occupation: Special Ed Pharmacist, hospital    Comment: Engineer, manufacturing  Tobacco Use   Smoking status: Former    Packs/day: 1.50    Years: 39.00    Pack years: 58.50    Types: Cigarettes    Quit date: 08/01/2017    Years since quitting: 3.2   Smokeless tobacco: Never   Tobacco comments:    using nicoderm patches  Vaping Use   Vaping Use: Never used  Substance and Sexual Activity   Alcohol use: Not Currently   Drug use:  No  Sexual activity: Yes    Partners: Male    Birth control/protection: Surgical, Post-menopausal    Comment: Hysterectomy  Other Topics Concern   Not on file  Social History Narrative   Not on file   Social Determinants of Health   Financial Resource Strain: Not on file  Food Insecurity: Not on file  Transportation Needs: Not on file  Physical Activity: Not on file  Stress: Not on file  Social Connections: Not on file  Intimate Partner Violence: Not on file     PHYSICAL EXAM  Vitals:   10/20/20 1040  BP: 133/81  Pulse: (!) 59  Weight: 222 lb (100.7 kg)  Height: _0  (1.702 m)    Body mass index is 34.77 kg/m.   General: The patient is well-developed and well-nourished and in no acute distress  HEENT:  Head is [place]/AT.  Sclera are anicteric.     Neck: No carotid bruits are noted.  The neck is nontender.  Cardiovascular: The heart has a regular rate and rhythm with a normal S1 and S2. There were no murmurs, gallops or rubs.    Skin: Extremities are without rash or  edema.  Musculoskeletal: The neck was fairly nontender but there was reduced range of motion.  She had some tenderness over the subacromial bursa of the left shoulder and had a reduced range of motion.  Neurologic Exam  Mental status: The patient is alert and oriented x 3 at the time of the examination. The patient has apparent normal recent and remote memory, with an apparently normal attention span and concentration ability.   Speech is normal.  Cranial nerves: Extraocular movements are full. Pupils are equal, round, and reactive to light and accomodation.  She had reduced temporal visual field in the upper quadrant worse in the lower quadrant on the right while the visual field was more normal on the left.  Facial symmetry is present. There is good facial sensation to soft touch bilaterally.Facial strength is normal.  Trapezius and sternocleidomastoid strength is normal. No dysarthria is noted.  The  tongue is midline, and the patient has symmetric elevation of the soft palate. No obvious hearing deficits are noted.  Motor:  Muscle bulk is normal.   Tone is normal.  Strength was 4+/5 in the left biceps and deltoid and 5/5 elsewhere in the left arm.  Strength is  5 / 5 in the right arm and both legs.  Sensory: Sensory testing is intact to pinprick, soft touch and vibration sensation in all 4 extremities.  Coordination: Cerebellar testing reveals good finger-nose-finger and heel-to-shin bilaterally.  Gait and station: Station is normal.   Gait is normal. Tandem gait is normal. Romberg is negative.   Reflexes: Deep tendon reflexes are symmetric and normal bilaterally.       DIAGNOSTIC DATA (LABS, IMAGING, TESTING) - I reviewed patient records, labs, notes, testing and imaging myself where available.  Lab Results  Component Value Date   WBC 7.2 11/02/2019   HGB 13.3 11/02/2019   HCT 42.2 11/02/2019   MCV 94.8 11/02/2019   PLT 303 11/02/2019      Component Value Date/Time   NA 141 11/02/2019 1814   NA 147 (H) 12/10/2018 1157   K 3.9 11/02/2019 1814   CL 105 11/02/2019 1814   CO2 28 11/02/2019 1814   GLUCOSE 97 11/02/2019 1814   BUN 16 11/02/2019 1814   BUN 19 12/10/2018 1157   CREATININE 1.07 (H) 11/02/2019 1814   CREATININE 1.41 (H) 12/16/2012  1554   CALCIUM 9.8 11/02/2019 1814   PROT 6.4 04/26/2020 1046   ALBUMIN 3.9 04/26/2020 1046   AST 32 04/26/2020 1046   ALT 35 (H) 04/26/2020 1046   ALKPHOS 54 04/26/2020 1046   BILITOT 0.7 04/26/2020 1046   GFRNONAA 53 (L) 11/02/2019 1814   GFRAA >60 11/02/2019 1814   Lab Results  Component Value Date   CHOL 175 03/21/2020   HDL 79 03/21/2020   LDLCALC 78 03/21/2020   TRIG 103 03/21/2020   CHOLHDL 2.2 03/21/2020   No results found for: HGBA1C Lab Results  Component Value Date   VITAMINB12 1,121 09/21/2016   Lab Results  Component Value Date   TSH 0.43 06/08/2019       ASSESSMENT AND PLAN  Cervical  radiculopathy - Plan: DG INJECT DIAG/THERA/INC NEEDLE/CATH/PLC EPI/LUMB/SAC W/IMG  Enlarged pituitary gland ([place]) - Plan: MR BRAIN W WO CONTRAST  Chronic left shoulder pain - Plan: DG INJECT DIAG/THERA/INC NEEDLE/CATH/PLC EPI/LUMB/SAC W/IMG  Visual field defect - Plan: MR BRAIN W WO CONTRAST  In summary, Pamela Lowe is a 70 year old woman with left arm/shoulder pain and left arm/hand numbness since early 2022.  The distribution of her symptoms is very consistent with left C5 and C6 radiculopathies matching the abnormalities noted on MRI.  I will see if we can get a cervical epidural steroid injection.  If symptoms persist she may need referral for surgery.  I will also send in a prescription for gabapentin to help with the dysesthesias.  Additionally, she appears to have a mild left subacromial bursitis.  While she was here, I did a left subacromial bursa injection with 40 mg Depo-Medrol and 3 cc Marcaine using sterile technique.  She tolerated procedure well and there were no complications.  Pain was better the shoulder afterwards.  She continues to have some tingling in the arm.  An unexpected finding is the enlarged pituitary gland that I noted on the recent MRI of the cervical spine and also see on the CT scan from 2021.  This is most likely a macroadenoma and it could be affecting the optic nerves.  We will check an MRI of the brain with thin section dynamic contrast images through the pituitary gland to better assess.  If the macroadenoma is confirmed I will refer her to neurosurgery.  She will return to see me in 3 months or sooner based on the results of the studies or if she has new or worsening neurologic symptoms.  Thank you for asking me to see Pamela Lowe.  Please let me know if I can be of further assistance in the future.   Ameilia Rattan A. Felecia Shelling, MD, [facility] 04/21/9530, 02:33 AM Certified in Neurology, Clinical Neurophysiology, Sleep Medicine and Neuroimaging  [facility] [address] [place], [place] [address] [phone]

## 2020-10-20 NOTE — Telephone Encounter (Signed)
MRI brain w/wo contrast Medicare/BCBS supp auth: NPR sent to GI for scheduling

## 2020-10-21 DIAGNOSIS — M5412 Radiculopathy, cervical region: Secondary | ICD-10-CM | POA: Diagnosis not present

## 2020-10-24 DIAGNOSIS — M25561 Pain in right knee: Secondary | ICD-10-CM | POA: Diagnosis not present

## 2020-10-25 DIAGNOSIS — M5412 Radiculopathy, cervical region: Secondary | ICD-10-CM | POA: Diagnosis not present

## 2020-10-25 DIAGNOSIS — E669 Obesity, unspecified: Secondary | ICD-10-CM | POA: Diagnosis not present

## 2020-10-25 DIAGNOSIS — Z6835 Body mass index (BMI) 35.0-35.9, adult: Secondary | ICD-10-CM | POA: Diagnosis not present

## 2020-10-26 ENCOUNTER — Other Ambulatory Visit: Payer: Self-pay

## 2020-10-26 ENCOUNTER — Ambulatory Visit
Admission: RE | Admit: 2020-10-26 | Discharge: 2020-10-26 | Disposition: A | Discharge status: Home / Self Care | Payer: Medicare Other | Source: Ambulatory Visit | Attending: Neurology | Admitting: Neurology

## 2020-10-26 DIAGNOSIS — H5340 Unspecified visual field defects: Secondary | ICD-10-CM | POA: Diagnosis not present

## 2020-10-26 DIAGNOSIS — E236 Other disorders of pituitary gland: Secondary | ICD-10-CM | POA: Diagnosis not present

## 2020-10-26 MED ORDER — GADOBENATE DIMEGLUMINE 529 MG/ML IV SOLN
10.0000 mL | Freq: Once | INTRAVENOUS | Status: AC | PRN
  Administered 2020-10-26: 10 mL via INTRAVENOUS

## 2020-10-27 ENCOUNTER — Other Ambulatory Visit: Payer: Self-pay | Admitting: Neurology

## 2020-10-27 DIAGNOSIS — D443 Neoplasm of uncertain behavior of pituitary gland: Secondary | ICD-10-CM | POA: Diagnosis not present

## 2020-10-27 DIAGNOSIS — H5340 Unspecified visual field defects: Secondary | ICD-10-CM

## 2020-10-27 DIAGNOSIS — D352 Benign neoplasm of pituitary gland: Secondary | ICD-10-CM

## 2020-10-27 DIAGNOSIS — H20023 Recurrent acute iridocyclitis, bilateral: Secondary | ICD-10-CM | POA: Diagnosis not present

## 2020-10-27 DIAGNOSIS — H401133 Primary open-angle glaucoma, bilateral, severe stage: Secondary | ICD-10-CM | POA: Diagnosis not present

## 2020-10-27 DIAGNOSIS — E236 Other disorders of pituitary gland: Secondary | ICD-10-CM

## 2020-10-27 NOTE — Progress Notes (Signed)
Order placed for neurosurgery at [facility]

## 2020-10-29 DIAGNOSIS — M1711 Unilateral primary osteoarthritis, right knee: Secondary | ICD-10-CM | POA: Diagnosis not present

## 2020-10-29 DIAGNOSIS — M25561 Pain in right knee: Secondary | ICD-10-CM | POA: Diagnosis not present

## 2020-10-31 DIAGNOSIS — Z79899 Other long term (current) drug therapy: Secondary | ICD-10-CM | POA: Diagnosis not present

## 2020-10-31 DIAGNOSIS — H209 Unspecified iridocyclitis: Secondary | ICD-10-CM | POA: Diagnosis not present

## 2020-10-31 DIAGNOSIS — L409 Psoriasis, unspecified: Secondary | ICD-10-CM | POA: Diagnosis not present

## 2020-10-31 DIAGNOSIS — M47819 Spondylosis without myelopathy or radiculopathy, site unspecified: Secondary | ICD-10-CM | POA: Diagnosis not present

## 2020-10-31 DIAGNOSIS — Z5181 Encounter for therapeutic drug level monitoring: Secondary | ICD-10-CM | POA: Diagnosis not present

## 2020-11-01 DIAGNOSIS — M1711 Unilateral primary osteoarthritis, right knee: Secondary | ICD-10-CM | POA: Diagnosis not present

## 2020-11-02 NOTE — Telephone Encounter (Signed)
Pamela Lowe with [facility] called stating the nurse will review this case.

## 2020-11-02 NOTE — Telephone Encounter (Signed)
Noted, faxed referral to [facility]. Ph # [phone] & fax # [phone].

## 2020-11-03 ENCOUNTER — Other Ambulatory Visit: Payer: Self-pay | Admitting: Student

## 2020-11-03 DIAGNOSIS — E669 Obesity, unspecified: Secondary | ICD-10-CM | POA: Diagnosis not present

## 2020-11-03 DIAGNOSIS — Z6835 Body mass index (BMI) 35.0-35.9, adult: Secondary | ICD-10-CM | POA: Diagnosis not present

## 2020-11-03 DIAGNOSIS — M25561 Pain in right knee: Secondary | ICD-10-CM

## 2020-11-05 ENCOUNTER — Ambulatory Visit
Admission: RE | Admit: 2020-11-05 | Discharge: 2020-11-05 | Disposition: A | Discharge status: Home / Self Care | Payer: Medicare Other | Source: Ambulatory Visit | Attending: Student | Admitting: Student

## 2020-11-05 ENCOUNTER — Other Ambulatory Visit: Payer: Self-pay

## 2020-11-05 DIAGNOSIS — M25561 Pain in right knee: Secondary | ICD-10-CM

## 2020-11-07 DIAGNOSIS — D329 Benign neoplasm of meninges, unspecified: Secondary | ICD-10-CM | POA: Diagnosis not present

## 2020-11-08 ENCOUNTER — Other Ambulatory Visit: Payer: Medicare Other

## 2020-11-18 DIAGNOSIS — M1711 Unilateral primary osteoarthritis, right knee: Secondary | ICD-10-CM | POA: Diagnosis not present

## 2020-11-18 DIAGNOSIS — S83241A Other tear of medial meniscus, current injury, right knee, initial encounter: Secondary | ICD-10-CM | POA: Diagnosis not present

## 2020-11-18 DIAGNOSIS — M25561 Pain in right knee: Secondary | ICD-10-CM | POA: Diagnosis not present

## 2020-11-28 ENCOUNTER — Ambulatory Visit (INDEPENDENT_AMBULATORY_CARE_PROVIDER_SITE_OTHER): Payer: Medicare Other | Admitting: Physician Assistant

## 2020-11-28 ENCOUNTER — Other Ambulatory Visit: Payer: Self-pay

## 2020-11-28 ENCOUNTER — Encounter: Payer: Self-pay | Admitting: Physician Assistant

## 2020-11-28 VITALS — BP 110/78 | HR 53 | Ht 67.0 in | Wt 217.4 lb

## 2020-11-28 DIAGNOSIS — I25118 Atherosclerotic heart disease of native coronary artery with other forms of angina pectoris: Secondary | ICD-10-CM

## 2020-11-28 DIAGNOSIS — I700 Atherosclerosis of aorta: Secondary | ICD-10-CM | POA: Diagnosis not present

## 2020-11-28 DIAGNOSIS — Z01810 Encounter for preprocedural cardiovascular examination: Secondary | ICD-10-CM | POA: Diagnosis not present

## 2020-11-28 DIAGNOSIS — R748 Abnormal levels of other serum enzymes: Secondary | ICD-10-CM

## 2020-11-28 DIAGNOSIS — D329 Benign neoplasm of meninges, unspecified: Secondary | ICD-10-CM

## 2020-11-28 DIAGNOSIS — I739 Peripheral vascular disease, unspecified: Secondary | ICD-10-CM | POA: Diagnosis not present

## 2020-11-28 DIAGNOSIS — E782 Mixed hyperlipidemia: Secondary | ICD-10-CM

## 2020-11-28 DIAGNOSIS — I10 Essential (primary) hypertension: Secondary | ICD-10-CM

## 2020-11-28 DIAGNOSIS — E0590 Thyrotoxicosis, unspecified without thyrotoxic crisis or storm: Secondary | ICD-10-CM | POA: Diagnosis not present

## 2020-11-28 NOTE — Progress Notes (Addendum)
Cardiology Office Note:    Date:  11/28/2020   ID:  Pamela Lowe, DOB 03-23-1950, MRN [id number]  PCP:  Pamela Cruel, MD   [facility] Cardiologist:  Pamela Grooms, MD      Referring MD: Pamela Stalker, PA-C   Chief Complaint:  Follow-up for CAD    Patient Profile:    Pamela Lowe is a 70 y.o. female with:  Coronary artery disease  S/p DES x 2 to mid to dist LAD and DES to D1 in 12/2018 Residual CAD: mRCA 40-65, small PDA 90 Hypertension  Hyperlipidemia  Hx of ? LFTs >> Rosuvastatin dose ? and ezetimibe DC'd Peripheral arterial disease  GERD Sellar meningioma (needs resection; Dr. Gerilyn Lowe at [facility]) Aortic atherosclerosis (CT in 11/2018)  Prior CV studies:  LEFT HEART CATH AND CORONARY ANGIOGRAPHY 12/17/2018 Narrative  Obstructive coronary artery disease with complex mid LAD diagonal obstruction.  There is functional total occlusion of the LAD beyond the second septal perforator and second diagonal.  The mid and apical LAD is supplied by collaterals from right to left.  The second diagonal contains segmental 90% stenosis.  The LAD proximal to the total occlusion contains diffuse disease extending proximal to the origin of the second diagonal and second septal perforator.  A percutaneous approach would cover the ostia of these 2 large branches.  Widely patent circumflex with 3 large obtuse marginals.  Diffusely diseased RCA with 40 to 60% stenosis throughout the mid segment.  PDA is relatively small but contains greater than 90% ostial narrowing.  The very distal end of the right coronary beyond the first left ventricular branch appears to be totally occluded.  Normal left ventricular function with LVEF 60%.  LVEDP normal.  CORONARY STENT INTERVENTION 12/26/2018 Narrative  Successful stent implantation in the mid segment of the second diagonal reducing 95% stenosis to 0% with TIMI grade III flow using a 2.0 x 18 Onyx postdilated the 2.25 mm in  diameter.  Successful overlapping stent implantation in the mid to distal LAD using a 2.5 x 26 stent proximally overlapping a 22 x 2.5 mm stent more distally all postdilated to 2.75 mm.  Total occlusion with TIMI grade 0 flow was reduced to 0% with TIMI grade III flow.    Echocardiogram 05/27/2018  1. The left ventricle has normal systolic function with an ejection  fraction of 60-65%. The cavity size was normal. There is mildly increased  left ventricular wall thickness. Left ventricular diastolic Doppler  parameters are consistent with impaired  relaxation.   2. The right ventricle has normal systolic function. The cavity was  normal. There is no increase in right ventricular wall thickness.   3. There is mild mitral annular calcification present.   4. Mild thickening of the aortic valve Mild calcification of the aortic  valve.   5. There is mild dilatation of the ascending aorta measuring 39 mm.   LE Arterial US 03/28/2016 Occluded prox-mid R SFA  Carotid US 12/29/15 Mild plaque w/o significant stenosis in bilat ICAs  Myocardial Perfusion Imaging ([place]) 12/08/15 EF 62, no ischemia   History of Present Illness: Ms. Grennan was last seen by Dr. Tamala Julian in 9/21.  In reviewing her chart, it looks like she needs a sellar meningioma removed with Dr. Gerilyn Lowe at [facility].  She returns for cardiology follow-up.  She is here alone.  She has had some issues with her right knee.  She has a torn meniscus and has been going to therapy.  She also had steroid injection.  She notes some discomfort in that leg.  It is difficult for her to tell if this is claudication or joint pain.  She has not had chest pain, shortness of breath, syncope, leg edema.  She sleeps on an incline due to glaucoma.        Past Medical History:  Diagnosis Date   Allergic rhinitis    Breast cyst 2007   Right   Cataract    surgery 07/2017   Depression 2006   w/ menopause   Family history of breast cancer    Family history  of colon cancer    Family history of kidney cancer 01/15/2018   Family history of lung cancer    Family history of pancreatic cancer    Family history of prostate cancer    Family history of uterine cancer    GERD (gastroesophageal reflux disease)    Glaucoma    History of hysterectomy, supracervical    Hyperlipidemia    Hypertension    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   MLH1 [id number] (Splice site)   Multinodular goiter    Pustular psoriasis    Tobacco user     Current Medications: Current Meds  Medication Sig   aspirin 81 MG EC tablet Take 81 mg by mouth every evening.    benazepril (LOTENSIN) 20 MG tablet Take 1 tablet by mouth once daily   brimonidine (ALPHAGAN) 0.2 % ophthalmic solution Place 1 drop into both eyes 3 (three) times daily.    Calcium Carb-Cholecalciferol (CALCIUM-VITAMIN D) 600-400 MG-UNIT TABS Take 1 tablet by mouth daily.   clopidogrel (PLAVIX) 75 MG tablet TAKE ONE TABLET BY MOUTH DAILY   COLLAGEN-VITAMIN C-BIOTIN PO Take by mouth.   COVID-19 mRNA Vac-TriS, Pfizer, (PFIZER-BIONT COVID-19 VAC-TRIS) SUSP injection Inject into the muscle.   dorzolamide-timolol (COSOPT) 22.3-6.8 MG/ML ophthalmic solution Place 1 drop into the right eye 2 (two) times daily.   ezetimibe (ZETIA) 10 MG tablet Take 1 tablet (10 mg total) by mouth daily.   fexofenadine (ALLEGRA) 60 MG tablet Take 60 mg by mouth as needed for allergies or rhinitis.   folic acid (FOLVITE) 1 MG tablet Take 1 mg by mouth daily.    gabapentin (NEURONTIN) 300 MG capsule Take 1 capsule (300 mg total) by mouth 3 (three) times daily.   hydrochlorothiazide (MICROZIDE) 12.5 MG capsule Take 1 capsule (12.5 mg total) by mouth every Monday, Wednesday, and Friday.   ipratropium (ATROVENT) 0.06 % nasal spray Place 2 sprays into both nostrils 3 (three) times daily.   methotrexate (RHEUMATREX) 2.5 MG tablet Take by mouth once a week. Mondays Take 6 tablets   metoprolol succinate (TOPROL-XL) 25 MG 24 hr tablet  Take 1 tablet by mouth once daily   Multiple Vitamins-Minerals (MULTIVITAMIN WITH MINERALS) tablet Take 1 tablet by mouth daily.   Omega-3 Fatty Acids (FISH OIL) 1000 MG CAPS Take 1 capsule by mouth daily.    pantoprazole (PROTONIX) 40 MG tablet TAKE ONE TABLET BY MOUTH DAILY   rosuvastatin (CRESTOR) 20 MG tablet Take 1 tablet (20 mg total) by mouth daily.   SIMPONI ARIA 50 MG/4ML SOLN injection Inject 50 mg into the vein. Every 60 Days     Allergies:   Sulfamethoxazole-trimethoprim and Wellbutrin [bupropion hcl]   Social History   Tobacco Use   Smoking status: Former    Packs/day: 1.50    Years: 39.00    Pack years: 58.50    Types: Cigarettes    Quit date: 08/01/2017  Years since quitting: 3.3   Smokeless tobacco: Never   Tobacco comments:    using nicoderm patches  Vaping Use   Vaping Use: Never used  Substance Use Topics   Alcohol use: Not Currently   Drug use: No     Family Hx: The patient's family history includes Allergies in her brother; Alzheimer's disease in her mother; Brain cancer in her paternal aunt; Breast cancer (age of onset: 32) in her sister; Breast cancer (age of onset: 63) in her sister; Breast cancer (age of onset: 62) in her sister; Breast cancer (age of onset: 31) in her cousin; Breast cancer (age of onset: 60) in her maternal aunt; Colon cancer (age of onset: 85) in her paternal grandfather; Deep vein thrombosis in her mother and sister; Heart attack in her maternal grandfather; Heart attack (age of onset: 35) in her brother; Hypertension in her father and mother; Kidney cancer (age of onset: 41) in her cousin; Kidney cancer (age of onset: 2) in her father; Kidney cancer (age of onset: 47) in her cousin; Lung cancer (age of onset: 77) in her paternal uncle; Lung cancer (age of onset: 59) in her maternal uncle; Osteopenia in her mother; Pancreatic cancer (age of onset: 57) in her paternal uncle; Pancreatic cancer (age of onset: 51) in her paternal aunt;  Pancreatic cancer (age of onset: 32) in her paternal aunt; Pneumonia in her father; Prostate cancer (age of onset: 68) in her cousin; Prostate cancer (age of onset: 69) in her maternal uncle; Uterine cancer (age of onset: 70) in her cousin; Uterine cancer (age of onset: 13) in her paternal grandmother. There is no history of Thyroid disease.  Review of Systems  Cardiovascular:  Positive for claudication (?? R leg claudication vs R knee pain from meniscus tear).  Gastrointestinal:  Negative for hematochezia.  Genitourinary:  Negative for hematuria.    EKGs/Labs/Other Test Reviewed:    EKG:  EKG is   ordered today.  The ekg ordered today demonstrates sinus bradycardia, HR 53, left axis deviation, poor wave progression, nonspecific ST-T wave changes, QTC 394  Recent Labs: 04/26/2020: ALT 35   Recent Lipid Panel Lab Results  Component Value Date/Time   CHOL 175 03/21/2020 09:29 AM   TRIG 103 03/21/2020 09:29 AM   HDL 79 03/21/2020 09:29 AM   LDLCALC 78 03/21/2020 09:29 AM   Labs obtained through [facility]  - personally reviewed and interpreted: 04/14/2020: LDL 64, total cholesterol 146, triglycerides 118, HDL 63   Risk Assessment/Calculations:          Physical Exam:    VS:  BP 110/78   Pulse (!) 53   Ht 5' 7"  (1.702 m)   Wt 217 lb 6.4 oz (98.6 kg)   LMP 03/19/1998 Comment: h/o supracervical hysterectomy  SpO2 97%   BMI 34.05 kg/m     Wt Readings from Last 3 Encounters:  11/28/20 217 lb 6.4 oz (98.6 kg)  10/20/20 222 lb (100.7 kg)  12/24/19 229 lb (103.9 kg)     Constitutional:      Appearance: Healthy appearance. Not in distress.  Pulmonary:     Effort: Pulmonary effort is normal.     Breath sounds: No wheezing. No rales.  Cardiovascular:     Normal rate. Regular rhythm. Normal S1. Normal S2.      Murmurs: There is no murmur.  Edema:    Peripheral edema absent.  Abdominal:     Palpations: Abdomen is soft.  Musculoskeletal:     Cervical  back: Neck supple.  Skin:    General: Skin is warm and dry.  Neurological:     Mental Status: Alert and oriented to person, place and time.     Cranial Nerves: Cranial nerves are intact.       ASSESSMENT & PLAN:    1. Coronary artery disease involving native coronary artery of native heart with other form of angina pectoris ([place]) History of DES x2 to the mid and distal LAD and DES x1 to the D1 in 12/2018.  She has residual disease with 90% stenosis in a small PDA and moderate mid RCA stenosis managed medically.  She is doing well without chest discomfort or shortness of breath to suggest angina.  Her symptoms prior to PCI helped remained completely resolved.  Given her multiple stents, I have recommended that we continue her on long-term dual antiplatelet therapy with aspirin and clopidogrel 75 mg daily.  Continue ezetimibe 10 mg daily, rosuvastatin 20 mg daily and metoprolol succinate mg daily.  2. Essential hypertension Blood pressure is well controlled.  Continue benazepril 20 mg daily, HCTZ 12.5 mg every Monday, Wednesday, Friday and metoprolol succinate 25 mg daily.  3. Mixed hyperlipidemia Recent LDL optimal.  Continue rosuvastatin 20 mg daily and ezetimibe 10 mg daily.  4. Elevated liver enzymes Improved.  Recent LFTs obtained via [facility] show ALT 32.    5. PVD (peripheral vascular disease) with claudication ([employer]) She notes some right lower extremity pain with activity.  She also has a meniscal tear on the right.  She does have a prior vascular study which demonstrated right SFA occlusion in 2018.  It is difficult to tell if her symptoms are claudication versus knee pain from her meniscal tear.  I have recommended proceeding with ABIs/arterial Dopplers.  We could consider [facility] to [facility] cardiology based upon her test results.  6. Aortic atherosclerosis (HCC) Continue aspirin 81 mg daily, rosuvastatin 20 mg daily.  7. Meningioma ([place]) She needs her meningioma resected.  8. Preoperative cardiovascular  examination   She also needs a colonoscopy.   Ms. Bert perioperative risk of a major cardiac event is 0.9% according to the Revised Cardiac Risk Index (RCRI).  Therefore, she is at low risk for perioperative complications.   Her functional capacity is fair at 4.4 METs according to the [employer] Activity Status Index (DASI). Recommendations: According to ACC/AHA guidelines, no further cardiovascular testing needed.  The patient may proceed to surgery at acceptable risk.   Antiplatelet and/or Anticoagulation Recommendations: For her Colonosocopy, she can hold Clopidogrel and remain on ASA for the procedure.  I will review further with Dr. Tamala Julian regarding antiplatelet Rx for her meningioma.  I presume she would need to be off both antiplatelets for this.  After further review with Dr. Tamala Julian, I will update my note with recommendations.    ADDENDUM:  I reviewed her case with Dr. Tamala Julian.  If required by the surgeon to reduce bleeding risk, the patient may hold both ASA and Clopidogrel prior to her colonoscopy and meningioma resection.  These agents should be resumed as soon as it is felt to be safe.   Dispo:  Return in about 6 months (around 05/28/2021) for Routine follow up in 6 months with PamelaSmith. .    Medication Adjustments/Labs and Tests Ordered: Current medicines are reviewed at length with the patient today.  Concerns regarding medicines are outlined above.  Tests Ordered: Orders Placed This Encounter  Procedures   EKG 12-Lead   VAS [place] ABI WITH/WO TBI  VAS [place] LOWER EXTREMITY ARTERIAL DUPLEX    Medication Changes: No orders of the defined types were placed in this encounter.   Signed, Richardson Dopp, PA-C  11/28/2020 2:33 PM    [facility] [place], [place], [place]  [address] Phone: [phone]; Fax: [phone]

## 2020-11-28 NOTE — Patient Instructions (Signed)
Medication Instructions:   Your physician recommends that you continue on your current medications as directed. Please refer to the Current Medication list given to you today.  *If you need a refill on your cardiac medications before your next appointment, please call your pharmacy*  Lab Work:  -NONE  If you have labs (blood work) drawn today and your tests are completely normal, you will receive your results only by: [facility] (if you have MyChart) OR A paper copy in the mail If you have any lab test that is abnormal or we need to change your treatment, we will call you to review the results.   Testing/Procedures: Your physician has requested that you have a lower extremity arterial exercise duplex. During this test, exercise and ultrasound are used to evaluate arterial blood flow in the legs. Allow one hour for this exam. There are no restrictions or special instructions.  Follow-Up: At [facility], you and your health needs are our priority.  As part of our continuing mission to provide you with exceptional heart care, we have created designated Provider Care Teams.  These Care Teams include your primary Cardiologist (physician) and Advanced Practice Providers (APPs -  Physician Assistants and Nurse Practitioners) who all work together to provide you with the care you need, when you need it.  We recommend signing up for the patient portal called "MyChart".  Sign up information is provided on this After Visit Summary.  MyChart is used to connect with patients for Virtual Visits (Telemedicine).  Patients are able to view lab/test results, encounter notes, upcoming appointments, etc.  Non-urgent messages can be sent to your provider as well.   To learn more about what you can do with MyChart, go to [email or web address].    Your next appointment:   6 month(s)  The format for your next appointment:   In Person  Provider:   Daneen Schick, MD   Other Instructions

## 2020-11-29 DIAGNOSIS — E669 Obesity, unspecified: Secondary | ICD-10-CM | POA: Diagnosis not present

## 2020-11-29 DIAGNOSIS — Z6835 Body mass index (BMI) 35.0-35.9, adult: Secondary | ICD-10-CM | POA: Diagnosis not present

## 2020-12-02 ENCOUNTER — Ambulatory Visit (HOSPITAL_BASED_OUTPATIENT_CLINIC_OR_DEPARTMENT_OTHER): Payer: Medicare Other | Admitting: Obstetrics & Gynecology

## 2020-12-05 ENCOUNTER — Ambulatory Visit (HOSPITAL_COMMUNITY)
Admission: RE | Admit: 2020-12-05 | Discharge: 2020-12-05 | Disposition: A | Discharge status: Home / Self Care | Payer: Medicare Other | Source: Ambulatory Visit | Attending: Internal Medicine | Admitting: Internal Medicine

## 2020-12-05 ENCOUNTER — Other Ambulatory Visit: Payer: Self-pay

## 2020-12-05 DIAGNOSIS — I25118 Atherosclerotic heart disease of native coronary artery with other forms of angina pectoris: Secondary | ICD-10-CM | POA: Insufficient documentation

## 2020-12-05 DIAGNOSIS — I700 Atherosclerosis of aorta: Secondary | ICD-10-CM | POA: Insufficient documentation

## 2020-12-05 DIAGNOSIS — R748 Abnormal levels of other serum enzymes: Secondary | ICD-10-CM

## 2020-12-05 DIAGNOSIS — D329 Benign neoplasm of meninges, unspecified: Secondary | ICD-10-CM | POA: Diagnosis not present

## 2020-12-05 DIAGNOSIS — E0590 Thyrotoxicosis, unspecified without thyrotoxic crisis or storm: Secondary | ICD-10-CM | POA: Diagnosis not present

## 2020-12-05 DIAGNOSIS — D352 Benign neoplasm of pituitary gland: Secondary | ICD-10-CM | POA: Diagnosis not present

## 2020-12-05 DIAGNOSIS — I10 Essential (primary) hypertension: Secondary | ICD-10-CM | POA: Diagnosis not present

## 2020-12-05 DIAGNOSIS — E049 Nontoxic goiter, unspecified: Secondary | ICD-10-CM | POA: Diagnosis not present

## 2020-12-05 DIAGNOSIS — E782 Mixed hyperlipidemia: Secondary | ICD-10-CM

## 2020-12-05 DIAGNOSIS — Z23 Encounter for immunization: Secondary | ICD-10-CM | POA: Diagnosis not present

## 2020-12-05 DIAGNOSIS — I739 Peripheral vascular disease, unspecified: Secondary | ICD-10-CM

## 2020-12-05 DIAGNOSIS — E210 Primary hyperparathyroidism: Secondary | ICD-10-CM | POA: Diagnosis not present

## 2020-12-06 ENCOUNTER — Other Ambulatory Visit: Payer: Self-pay | Admitting: Interventional Cardiology

## 2020-12-06 ENCOUNTER — Encounter: Payer: Self-pay | Admitting: Gastroenterology

## 2020-12-06 ENCOUNTER — Telehealth: Payer: Self-pay

## 2020-12-06 ENCOUNTER — Ambulatory Visit (INDEPENDENT_AMBULATORY_CARE_PROVIDER_SITE_OTHER): Payer: Medicare Other | Admitting: Gastroenterology

## 2020-12-06 DIAGNOSIS — I25118 Atherosclerotic heart disease of native coronary artery with other forms of angina pectoris: Secondary | ICD-10-CM | POA: Diagnosis not present

## 2020-12-06 DIAGNOSIS — K589 Irritable bowel syndrome without diarrhea: Secondary | ICD-10-CM

## 2020-12-06 DIAGNOSIS — Z1509 Genetic susceptibility to other malignant neoplasm: Secondary | ICD-10-CM | POA: Diagnosis not present

## 2020-12-06 MED ORDER — HYOSCYAMINE SULFATE SL 0.125 MG SL SUBL: SUBLINGUAL_TABLET | SUBLINGUAL | 0 refills | Status: DC

## 2020-12-06 MED ORDER — NA SULFATE-K SULFATE-MG SULF 17.5-3.13-1.6 GM/177ML PO SOLN: 1.0000 | ORAL | 0 refills | Status: DC

## 2020-12-06 NOTE — Telephone Encounter (Signed)
[facility] Pre-operative Risk Assessment     Request for surgical clearance:     Endoscopy Procedure  What type of surgery is being performed?     Colonscopy  When is this surgery scheduled?     12-26-20  What type of clearance is required ?   Pharmacy  Are there any medications that need to be held prior to surgery and how long? Plavix x 5 days  Practice name and name of physician performing surgery?      [facility]  What is your office phone and fax number?      Phone- [phone]  [phone]  Anesthesia type (None, local, MAC, general) ?       MAC

## 2020-12-06 NOTE — Progress Notes (Signed)
Review of pertinent gastrointestinal problems: 1.  Lynch syndrome.  MLH1 mutation.  EGD February 2020 showed mild gastritis, H. pylori negative on biopsies.  Colonoscopy February 2020 showed left-sided diverticulosis and a 2 mm tubular adenoma.   HPI: This is a very pleasant 70 year old woman   I Last saw her here in the office about a year and a half ago.  March 2021.  She was at high risk for stopping stool antiplatelet therapy with aspirin and Plavix given recent coronary artery disease diagnosis and stenting.  Her cardiologist was hoping that she would not have to stop her Plavix too early and so he plans to have her back here to discuss it again sometime later 2021.  Blood work May 2022 CBC was normal, hepatic function panel was normal  Today she tells me she has had no lower GI issues.  She is still on Plavix once daily.  She believes that it will be safe for her to hold this for 5 days now based on interactions in the past week or 2 with her cardiologist.  She also brought up periodic upper abdominal spasms that are always relieved when she passes gas.  These can be quite significantly painful.  She has tried hyoscyamine in the past with some good relief.  ROS: complete GI ROS as described in HPI, all other review negative.  Constitutional:  No unintentional weight loss   Past Medical History:  Diagnosis Date   Allergic rhinitis    Breast cyst 2007   Right   Cataract    surgery 07/2017   Depression 2006   w/ menopause   Family history of breast cancer    Family history of colon cancer    Family history of kidney cancer 01/15/2018   Family history of lung cancer    Family history of pancreatic cancer    Family history of prostate cancer    Family history of uterine cancer    GERD (gastroesophageal reflux disease)    Glaucoma    History of hysterectomy, supracervical    Hyperlipidemia    Hypertension    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   MLH1 [id number] ([facility]  site)   Multinodular goiter    Pustular psoriasis    Tobacco user     Past Surgical History:  Procedure Laterality Date   BREAST BIOPSY Bilateral 1985   CARDIAC CATHETERIZATION     CATARACT EXTRACTION Right 07/2017   COLONOSCOPY     CORONARY STENT INTERVENTION N/A 12/26/2018   Procedure: CORONARY STENT INTERVENTION;  Surgeon: Belva Crome, MD;  Location: [facility];  Service: Cardiovascular;  Laterality: N/A;   LEFT HEART CATH AND CORONARY ANGIOGRAPHY N/A 12/17/2018   Procedure: LEFT HEART CATH AND CORONARY ANGIOGRAPHY;  Surgeon: Belva Crome, MD;  Location: [facility];  Service: Cardiovascular;  Laterality: N/A;   SUPRACERVICAL ABDOMINAL HYSTERECTOMY  1986   h/o supracervical hysterectomy   UPPER GASTROINTESTINAL ENDOSCOPY      Current Outpatient Medications  Medication Sig Dispense Refill   aspirin 81 MG EC tablet Take 81 mg by mouth every evening.      benazepril (LOTENSIN) 20 MG tablet Take 1 tablet by mouth once daily 30 tablet 10   brimonidine (ALPHAGAN) 0.2 % ophthalmic solution Place 1 drop into both eyes 3 (three) times daily.      Calcium Carb-Cholecalciferol (CALCIUM-VITAMIN D) 600-400 MG-UNIT TABS Take 1 tablet by mouth daily.     clopidogrel (PLAVIX) 75 MG tablet TAKE ONE TABLET BY  MOUTH DAILY 90 tablet 3   COLLAGEN-VITAMIN C-BIOTIN PO Take by mouth.     COVID-19 mRNA Vac-TriS, Pfizer, (PFIZER-BIONT COVID-19 VAC-TRIS) SUSP injection Inject into the muscle. 0.3 mL 0   dorzolamide-timolol (COSOPT) 22.3-6.8 MG/ML ophthalmic solution Place 1 drop into the right eye 2 (two) times daily.     ezetimibe (ZETIA) 10 MG tablet Take 1 tablet (10 mg total) by mouth daily. 90 tablet 3   fexofenadine (ALLEGRA) 60 MG tablet Take 60 mg by mouth as needed for allergies or rhinitis.     folic acid (FOLVITE) 1 MG tablet Take 1 mg by mouth daily.      hydrochlorothiazide (MICROZIDE) 12.5 MG capsule Take 1 capsule (12.5 mg total) by mouth every Monday, Wednesday, and  Friday. 45 capsule 3   ipratropium (ATROVENT) 0.06 % nasal spray Place 2 sprays into both nostrils 3 (three) times daily.     methotrexate (RHEUMATREX) 2.5 MG tablet Take by mouth once a week. Mondays Take 6 tablets     metoprolol succinate (TOPROL-XL) 25 MG 24 hr tablet Take 1 tablet by mouth once daily 90 tablet 0   Multiple Vitamins-Minerals (MULTIVITAMIN WITH MINERALS) tablet Take 1 tablet by mouth daily.     Omega-3 Fatty Acids (FISH OIL) 1000 MG CAPS Take 1 capsule by mouth daily.      pantoprazole (PROTONIX) 40 MG tablet TAKE ONE TABLET BY MOUTH DAILY 90 tablet 3   rosuvastatin (CRESTOR) 20 MG tablet TAKE ONE TABLET BY MOUTH DAILY 90 tablet 3   SIMPONI ARIA 50 MG/4ML SOLN injection Inject 50 mg into the vein. Every 60 Days     No current facility-administered medications for this visit.    Allergies as of 12/06/2020 - Review Complete 12/06/2020  Allergen Reaction Noted   Sulfamethoxazole-trimethoprim Other (See Comments) and Rash 10/25/2017   Wellbutrin [bupropion hcl] Rash 11/08/2010    Family History  Problem Relation Age of Onset   Kidney cancer Father 68   Pneumonia Father    Hypertension Father    Hypertension Mother    Deep vein thrombosis Mother    Alzheimer's disease Mother    Osteopenia Mother    Heart attack Maternal Grandfather    Breast cancer Sister 15       recurrence 55   Allergies Brother    Breast cancer Sister 87       Stage 0   Breast cancer Sister 36       Stage 3, bilateral mastectomy   Heart attack Brother 19   Deep vein thrombosis Sister            Pancreatic cancer Paternal Aunt 37   Breast cancer Maternal Aunt 67   Prostate cancer Maternal Uncle 68   Pancreatic cancer Paternal Uncle 15   Uterine cancer Paternal Grandmother 72   Colon cancer Paternal Grandfather 56   Pancreatic cancer Paternal Aunt 55   Lung cancer Paternal Uncle 70   Brain cancer Paternal Aunt    Kidney cancer Cousin 38   Kidney cancer Cousin 52   Uterine cancer Cousin  62   Prostate cancer Cousin 57   Lung cancer Maternal Uncle 60   Breast cancer Cousin 60   Thyroid disease Neg Hx     Social History   Socioeconomic History   Marital status: Married    Spouse name: [name]   Number of children: 0   Years of education: Not on file   Highest education level: Master's degree (e.g., MA, MS, MEng, MEd,  MSW, MBA)  Occupational History   Occupation: Special Ed Pharmacist, hospital    Comment: Engineer, manufacturing  Tobacco Use   Smoking status: Former    Packs/day: 1.50    Years: 39.00    Pack years: 58.50    Types: Cigarettes    Quit date: 08/01/2017    Years since quitting: 3.3   Smokeless tobacco: Never   Tobacco comments:    using nicoderm patches  Vaping Use   Vaping Use: Never used  Substance and Sexual Activity   Alcohol use: Not Currently   Drug use: No   Sexual activity: Yes    Partners: Male    Birth control/protection: Surgical, Post-menopausal    Comment: Hysterectomy  Other Topics Concern   Not on file  Social History Narrative   Not on file   Social Determinants of Health   Financial Resource Strain: Not on file  Food Insecurity: Not on file  Transportation Needs: Not on file  Physical Activity: Not on file  Stress: Not on file  Social Connections: Not on file  Intimate Partner Violence: Not on file     Physical Exam: BP 116/70   Pulse 64   Ht 5' 7"  (1.702 m)   Wt 219 lb (99.3 kg)   LMP 03/19/1998 Comment: h/o supracervical hysterectomy  BMI 34.30 kg/m  Constitutional: generally well-appearing Psychiatric: alert and oriented x3 Abdomen: soft, nontender, nondistended, no obvious ascites, no peritoneal signs, normal bowel sounds No peripheral edema noted in lower extremities  Assessment and plan: 70 y.o. female with Lynch syndrome, elevated risk for procedure related complications given ongoing blood thinner use, spastic abdominal pains  First she is due for screening colonoscopy which we will arrange for her at her soonest  convenience.  She has Lynch syndrome and knows that she needs a screening examination about every year.  She is at increased risk for procedure related complications given her ongoing blood thinner use so I recommended that she stop her Plavix for 5 days prior to the colonoscopy.  We will reach out to her cardiologist to make sure that he feels that it is safe at this time for her to hold it for 5 days.  I think she is having GI spasms periodically.  These abdominal pains are almost always relieved when she passes gas.  I am calling in Levsin sublingual 0.125 mg tabs to take 1-2 tabs twice daily as needed for these pains which really do not happen too often fortunately.  Please see the "Patient Instructions" section for addition details about the plan.  Owens Loffler, MD [facility] 12/06/2020, 2:09 PM   Total time on date of encounter was 30 minutes (this included time spent preparing to see the patient reviewing records; obtaining and/or reviewing separately obtained history; performing a medically appropriate exam and/or evaluation; counseling and educating the patient and family if present; ordering medications, tests or procedures if applicable; and documenting clinical information in the health record).

## 2020-12-06 NOTE — Patient Instructions (Addendum)
If you are age 69 or older, your body mass index should be between 23-30. Your Body mass index is 34.3 kg/m. If this is out of the aforementioned range listed, please consider follow up with your Primary Care Provider.  _________________________________________________________  The [facility] providers would like to encourage you to use [facility] to communicate with providers for non-urgent requests or questions.  Due to long hold times on the telephone, sending your provider a message by [facility] may be a faster and more efficient way to get a response.  Please allow 48 business hours for a response.  Please remember that this is for non-urgent requests.   You have been scheduled for a colonoscopy. Please follow written instructions given to you at your visit today.  Please pick up your prep supplies at the pharmacy within the next 1-3 days. If you use inhalers (even only as needed), please bring them with you on the day of your procedure.  Due to recent changes in healthcare laws, you may see the results of your imaging and laboratory studies on MyChart before your provider has had a chance to review them.  We understand that in some cases there may be results that are confusing or concerning to you. Not all laboratory results come back in the same time frame and the provider may be waiting for multiple results in order to interpret others.  Please give [place] 48 hours in order for your provider to thoroughly review all the results before contacting the office for clarification of your results.   You will be contacted by our office prior to your procedure for directions on holding your Plavix.  If you do not hear from our office 1 week prior to your scheduled procedure, please call [phone] to discuss.   We have sent the following medications to your pharmacy for you to pick up at your convenience:  START: Levsin 0.125mg  take 1 to 2 tablets sublingual (under the tongue) twice daily as needed.     Thank you for entrusting me with your care and choosing [facility].  Dr Ardis Hughs

## 2020-12-07 ENCOUNTER — Encounter: Payer: Self-pay | Admitting: Physician Assistant

## 2020-12-07 DIAGNOSIS — D329 Benign neoplasm of meninges, unspecified: Secondary | ICD-10-CM | POA: Diagnosis not present

## 2020-12-07 NOTE — Telephone Encounter (Signed)
   Patient Name: Pamela Lowe  DOB: [birth year] MRN: [id number]  Primary Cardiologist: Sinclair Grooms, MD  Chart reviewed as part of pre-operative protocol coverage. Cardiac clearance for colonoscopy was addressed in Richardson Dopp PA-C's note from 11/28/20. Will route this bundled recommendation and Scott's OV to requesting provider via Epic fax function. Please call with questions.  Charlie Pitter, PA-C 12/07/2020, 9:57 AM

## 2020-12-07 NOTE — Telephone Encounter (Signed)
Left message for patient to return call to discuss Plavix hold prior to colonoscopy.  Will continue efforts.

## 2020-12-07 NOTE — Telephone Encounter (Signed)
Go over patient instructions again, as I wrote wrong date on patient instructions.

## 2020-12-08 ENCOUNTER — Other Ambulatory Visit: Payer: Self-pay

## 2020-12-08 ENCOUNTER — Telehealth: Payer: Self-pay | Admitting: Physician Assistant

## 2020-12-08 ENCOUNTER — Ambulatory Visit (INDEPENDENT_AMBULATORY_CARE_PROVIDER_SITE_OTHER): Payer: Medicare Other | Admitting: Obstetrics & Gynecology

## 2020-12-08 ENCOUNTER — Encounter (HOSPITAL_BASED_OUTPATIENT_CLINIC_OR_DEPARTMENT_OTHER): Payer: Self-pay | Admitting: Obstetrics & Gynecology

## 2020-12-08 ENCOUNTER — Other Ambulatory Visit (HOSPITAL_COMMUNITY)
Admission: RE | Admit: 2020-12-08 | Discharge: 2020-12-08 | Disposition: A | Discharge status: Home / Self Care | Payer: Medicare Other | Source: Ambulatory Visit | Attending: Obstetrics & Gynecology | Admitting: Obstetrics & Gynecology

## 2020-12-08 VITALS — BP 131/75 | HR 49 | Ht 65.75 in | Wt 216.4 lb

## 2020-12-08 DIAGNOSIS — Z1151 Encounter for screening for human papillomavirus (HPV): Secondary | ICD-10-CM | POA: Insufficient documentation

## 2020-12-08 DIAGNOSIS — I25118 Atherosclerotic heart disease of native coronary artery with other forms of angina pectoris: Secondary | ICD-10-CM | POA: Diagnosis not present

## 2020-12-08 DIAGNOSIS — Z780 Asymptomatic menopausal state: Secondary | ICD-10-CM

## 2020-12-08 DIAGNOSIS — Z90711 Acquired absence of uterus with remaining cervical stump: Secondary | ICD-10-CM | POA: Diagnosis not present

## 2020-12-08 DIAGNOSIS — Z01419 Encounter for gynecological examination (general) (routine) without abnormal findings: Secondary | ICD-10-CM | POA: Insufficient documentation

## 2020-12-08 DIAGNOSIS — Z803 Family history of malignant neoplasm of breast: Secondary | ICD-10-CM | POA: Diagnosis not present

## 2020-12-08 DIAGNOSIS — Z124 Encounter for screening for malignant neoplasm of cervix: Secondary | ICD-10-CM | POA: Diagnosis not present

## 2020-12-08 DIAGNOSIS — Z1509 Genetic susceptibility to other malignant neoplasm: Secondary | ICD-10-CM

## 2020-12-08 DIAGNOSIS — R1909 Other intra-abdominal and pelvic swelling, mass and lump: Secondary | ICD-10-CM | POA: Diagnosis not present

## 2020-12-08 NOTE — Telephone Encounter (Signed)
   Pt said during her appt with Richardson Dopp he ok'd for her to help plavix  prior coloscopy, she wanted to ask him if she also need to held her aspirin

## 2020-12-08 NOTE — Telephone Encounter (Signed)
Spoke with the pt and reviewed to her note, as mentioned below from [facility] and Dr. Tamala Julian (last OV note), about holding ASA and Plavix prior to her procedures.  Informed the pt that as indicated in note below from [facility], if required by the Surgeon to reduce bleeding risk, the pt may hold both ASA and Plavix prior to her colonoscopy and meningioma resection, and these agents should be resumed as soon as it is felt safe by the Surgeon performing the procedure.  Pt verbalized understanding and agrees with this plan. Pt was more than gracious for all the assistance provided.       8. Preoperative cardiovascular examination   She also needs a colonoscopy.   Ms. Tomlin perioperative risk of a major cardiac event is 0.9% according to the Revised Cardiac Risk Index (RCRI).  Therefore, she is at low risk for perioperative complications.   Her functional capacity is fair at 4.4 METs according to the [facility] Activity Status Index (DASI). Recommendations: According to ACC/AHA guidelines, no further cardiovascular testing needed.  The patient may proceed to surgery at acceptable risk.   Antiplatelet and/or Anticoagulation Recommendations: For her Colonosocopy, she can hold Clopidogrel and remain on ASA for the procedure.  I will review further with Dr. Tamala Julian regarding antiplatelet Rx for her meningioma.  I presume she would need to be off both antiplatelets for this.  After further review with Dr. Tamala Julian, I will update my note with recommendations.     ADDENDUM:  I reviewed her case with Dr. Tamala Julian.  If required by the surgeon to reduce bleeding risk, the patient may hold both ASA and Clopidogrel prior to her colonoscopy and meningioma resection.  These agents should be resumed as soon as it is felt to be safe.    Dispo:  Return in about 6 months (around 05/28/2021) for Routine follow up in 6 months with Dr.Smith.

## 2020-12-08 NOTE — Progress Notes (Deleted)
70 y.o. G1P0010 Married Pamela Lowe American female here for breast and pelvic exam.  I am also following her for ***.  Denies vaginal bleeding.  Patient's last menstrual period was 03/19/1998.          Sexually active: Yes.    H/O STD:  {YES NO:22349}  Health Maintenance: PCP:  ****.  Last wellness appt was ***.  Did blood work at that appt:   Vaccines are up to date:  {YES NO:22349} Colonoscopy:  05/07/18 MMG:  *** BMD:  05/05/19 Last pap smear:  10/25/17 neg.   H/o abnormal pap smear:      reports that she quit smoking about 3 years ago. Her smoking use included cigarettes. She has a 58.50 pack-year smoking history. She has never used smokeless tobacco. She reports that she does not currently use alcohol. She reports that she does not use drugs.  Past Medical History:  Diagnosis Date   Allergic rhinitis    Brain tumor ([place])    Breast cyst 2007   Right   Cataract    surgery 07/2017   Depression 2006   w/ menopause   Family history of breast cancer    Family history of colon cancer    Family history of kidney cancer 01/15/2018   Family history of lung cancer    Family history of pancreatic cancer    Family history of prostate cancer    Family history of uterine cancer    GERD (gastroesophageal reflux disease)    Glaucoma    History of hysterectomy, supracervical    Hyperlipidemia    Hypertension    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   MLH1 [id number] (Splice site)   Multinodular goiter    PAD (peripheral artery disease) ([place])    LE Art [place] 1/18: Occluded prox-mid R SFA // ABIs 9/22: R 0.83; L 1.0   Pustular psoriasis    Tobacco user     Past Surgical History:  Procedure Laterality Date   BREAST BIOPSY Bilateral 1985   CARDIAC CATHETERIZATION     CATARACT EXTRACTION Right 07/2017   COLONOSCOPY     CORONARY STENT INTERVENTION N/A 12/26/2018   Procedure: CORONARY STENT INTERVENTION;  Surgeon: Belva Crome, MD;  Location: [facility];  Service:  Cardiovascular;  Laterality: N/A;   LEFT HEART CATH AND CORONARY ANGIOGRAPHY N/A 12/17/2018   Procedure: LEFT HEART CATH AND CORONARY ANGIOGRAPHY;  Surgeon: Belva Crome, MD;  Location: [place] [facility];  Service: Cardiovascular;  Laterality: N/A;   SUPRACERVICAL ABDOMINAL HYSTERECTOMY  1986   h/o supracervical hysterectomy   UPPER GASTROINTESTINAL ENDOSCOPY      Current Outpatient Medications  Medication Sig Dispense Refill   aspirin 81 MG EC tablet Take 81 mg by mouth every evening.      benazepril (LOTENSIN) 20 MG tablet Take 1 tablet by mouth once daily 30 tablet 10   brimonidine (ALPHAGAN) 0.2 % ophthalmic solution Place 1 drop into both eyes 3 (three) times daily.      Calcium Carb-Cholecalciferol (CALCIUM-VITAMIN D) 600-400 MG-UNIT TABS Take 1 tablet by mouth daily.     clopidogrel (PLAVIX) 75 MG tablet TAKE ONE TABLET BY MOUTH DAILY 90 tablet 3   COLLAGEN-VITAMIN C-BIOTIN PO Take by mouth.     dorzolamide-timolol (COSOPT) 22.3-6.8 MG/ML ophthalmic solution Place 1 drop into the right eye 2 (two) times daily.     ezetimibe (ZETIA) 10 MG tablet Take 1 tablet (10 mg total) by mouth daily. 90 tablet 3  fexofenadine (ALLEGRA) 60 MG tablet Take 60 mg by mouth as needed for allergies or rhinitis.     folic acid (FOLVITE) 1 MG tablet Take 1 mg by mouth daily.      hydrochlorothiazide (MICROZIDE) 12.5 MG capsule Take 1 capsule (12.5 mg total) by mouth every Monday, Wednesday, and Friday. 45 capsule 3   Hyoscyamine Sulfate SL (LEVSIN/SL) 0.125 MG SUBL Take 1 to 2 tablets sublingual (under the tongue) twice daily as needed. 50 tablet 0   ipratropium (ATROVENT) 0.06 % nasal spray Place 2 sprays into both nostrils 3 (three) times daily.     methotrexate (RHEUMATREX) 2.5 MG tablet Take by mouth once a week. Mondays Take 6 tablets     metoprolol succinate (TOPROL-XL) 25 MG 24 hr tablet Take 1 tablet by mouth once daily 90 tablet 0   Multiple Vitamins-Minerals (MULTIVITAMIN WITH MINERALS) tablet  Take 1 tablet by mouth daily.     Omega-3 Fatty Acids (FISH OIL) 1000 MG CAPS Take 1 capsule by mouth daily.      pantoprazole (PROTONIX) 40 MG tablet TAKE ONE TABLET BY MOUTH DAILY 90 tablet 3   rosuvastatin (CRESTOR) 20 MG tablet TAKE ONE TABLET BY MOUTH DAILY 90 tablet 3   SIMPONI ARIA 50 MG/4ML SOLN injection Inject 50 mg into the vein. Every 60 Days     Na Sulfate-K Sulfate-Mg Sulf (SUPREP BOWEL PREP KIT) 17.5-3.13-1.6 GM/177ML SOLN Take 1 kit by mouth as directed. (Patient not taking: Reported on 12/08/2020) 324 mL 0   No current facility-administered medications for this visit.    Family History  Problem Relation Age of Onset   Kidney cancer Father 41   Pneumonia Father    Hypertension Father    Hypertension Mother    Deep vein thrombosis Mother    Alzheimer's disease Mother    Osteopenia Mother    Heart attack Maternal Grandfather    Breast cancer Sister 53       recurrence 46   Allergies Brother    Breast cancer Sister 76       Stage 0   Breast cancer Sister 13       Stage 3, bilateral mastectomy   Heart attack Brother 35   Deep vein thrombosis Sister            Pancreatic cancer Paternal Aunt 72   Breast cancer Maternal Aunt 67   Prostate cancer Maternal Uncle 68   Pancreatic cancer Paternal Uncle 57   Uterine cancer Paternal Grandmother 67   Colon cancer Paternal Grandfather 12   Pancreatic cancer Paternal Aunt 77   Lung cancer Paternal Uncle 58   Brain cancer Paternal Aunt    Kidney cancer Cousin 106   Kidney cancer Cousin 52   Uterine cancer Cousin 62   Prostate cancer Cousin 57   Lung cancer Maternal Uncle 60   Breast cancer Cousin 60   Thyroid disease Neg Hx     Review of Systems  Exam:   BP 131/75   Pulse (!) 49   Ht 5' 5.75" (1.67 m)   Wt 216 lb 6.4 oz (98.2 kg)   LMP 03/19/1998 Comment: h/o supracervical hysterectomy  BMI 35.19 kg/m   Height: 5' 5.75" (167 cm)  General appearance: alert, cooperative and appears stated age Breasts: {Exam;  breast:13139::"normal appearance, no masses or tenderness"} Abdomen: soft, non-tender; bowel sounds normal; no masses,  no organomegaly Lymph nodes: Cervical, supraclavicular, and axillary nodes normal.  No abnormal inguinal nodes palpated Neurologic: Grossly normal  Pelvic: External genitalia:  no lesions              Urethra:  normal appearing urethra with no masses, tenderness or lesions              Bartholins and Skenes: normal                 Vagina: normal appearing vagina with atrophic changes ***and no discharge, no lesions              Cervix: {exam; cervix:14595}              Pap taken: {yes no:314532} Bimanual Exam:  Uterus:  {exam; uterus:12215}              Adnexa: {exam; adnexa:12223}               Rectovaginal: Confirms               Anus:  normal sphincter tone, no lesions  Chaperone, ***, CMA, was present for exam.  Assessment/Plan: There are no diagnoses linked to this encounter.

## 2020-12-08 NOTE — Progress Notes (Signed)
70 y.o. G1P0010 Married [place] American female here for breast and pelvic exam.  I am also following her for h/o Lynch Syndrome.  Has increased risk for ovarian cancer.  Has not been able to have ovary removal due to other medical conditions that occur.   Denies vaginal bleeding.  Had pinched nerve in neck and had MRI.  This showed a pituitary tumor.  Has seen specialist at [facility] who thinks this is a meningioma.  Will have this surgically removed.  Has gotten clearance from cardiology.  Has colonoscopy scheduled for 12/26/2020.  Does continue to have intermittent abdominal bloating.  Patient's last menstrual period was 03/19/1998.          Sexually active: Yes.    H/O STD:  no  Health Maintenance: PCP:  Dr. Harrington Challenger.  Last wellness appt was 2020.  Did blood work at that appt: done with cardiology Vaccines are up to date:  yes Colonoscopy:  scheduled next month MMG:  04/2020 BMD:  2021, normal Last pap smear:  2019.   H/o abnormal pap smear:  no   reports that she quit smoking about 3 years ago. Her smoking use included cigarettes. She has a 58.50 pack-year smoking history. She has never used smokeless tobacco. She reports that she does not currently use alcohol. She reports that she does not use drugs.  Past Medical History:  Diagnosis Date   Allergic rhinitis    Brain tumor ([place])    Breast cyst 2007   Right   Cataract    surgery 07/2017   Depression 2006   w/ menopause   Family history of breast cancer    Family history of colon cancer    Family history of kidney cancer 01/15/2018   Family history of lung cancer    Family history of pancreatic cancer    Family history of prostate cancer    Family history of uterine cancer    GERD (gastroesophageal reflux disease)    Glaucoma    History of hysterectomy, supracervical    Hyperlipidemia    Hypertension    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   MLH1 [id number] (Splice site)   Multinodular goiter    PAD (peripheral  artery disease) ([place])    LE Art [place] 1/18: Occluded prox-mid R SFA // ABIs 9/22: R 0.83; L 1.0   Pustular psoriasis    Tobacco user     Past Surgical History:  Procedure Laterality Date   BREAST BIOPSY Bilateral 1985   CARDIAC CATHETERIZATION     CATARACT EXTRACTION Right 07/2017   COLONOSCOPY     CORONARY STENT INTERVENTION N/A 12/26/2018   Procedure: CORONARY STENT INTERVENTION;  Surgeon: Belva Crome, MD;  Location: [facility] [place];  Service: Cardiovascular;  Laterality: N/A;   LEFT HEART CATH AND CORONARY ANGIOGRAPHY N/A 12/17/2018   Procedure: LEFT HEART CATH AND CORONARY ANGIOGRAPHY;  Surgeon: Belva Crome, MD;  Location: [facility];  Service: Cardiovascular;  Laterality: N/A;   SUPRACERVICAL ABDOMINAL HYSTERECTOMY  1986   h/o supracervical hysterectomy   UPPER GASTROINTESTINAL ENDOSCOPY      Current Outpatient Medications  Medication Sig Dispense Refill   aspirin 81 MG EC tablet Take 81 mg by mouth every evening.      benazepril (LOTENSIN) 20 MG tablet Take 1 tablet by mouth once daily 30 tablet 10   brimonidine (ALPHAGAN) 0.2 % ophthalmic solution Place 1 drop into both eyes 3 (three) times daily.      Calcium Carb-Cholecalciferol (CALCIUM-VITAMIN  D) 600-400 MG-UNIT TABS Take 1 tablet by mouth daily.     clopidogrel (PLAVIX) 75 MG tablet TAKE ONE TABLET BY MOUTH DAILY 90 tablet 3   COLLAGEN-VITAMIN C-BIOTIN PO Take by mouth.     dorzolamide-timolol (COSOPT) 22.3-6.8 MG/ML ophthalmic solution Place 1 drop into the right eye 2 (two) times daily.     ezetimibe (ZETIA) 10 MG tablet Take 1 tablet (10 mg total) by mouth daily. 90 tablet 3   fexofenadine (ALLEGRA) 60 MG tablet Take 60 mg by mouth as needed for allergies or rhinitis.     folic acid (FOLVITE) 1 MG tablet Take 1 mg by mouth daily.      hydrochlorothiazide (MICROZIDE) 12.5 MG capsule Take 1 capsule (12.5 mg total) by mouth every Monday, Wednesday, and Friday. 45 capsule 3   Hyoscyamine Sulfate SL (LEVSIN/SL)  0.125 MG SUBL Take 1 to 2 tablets sublingual (under the tongue) twice daily as needed. 50 tablet 0   ipratropium (ATROVENT) 0.06 % nasal spray Place 2 sprays into both nostrils 3 (three) times daily.     methotrexate (RHEUMATREX) 2.5 MG tablet Take by mouth once a week. Mondays Take 6 tablets     metoprolol succinate (TOPROL-XL) 25 MG 24 hr tablet Take 1 tablet by mouth once daily 90 tablet 0   Multiple Vitamins-Minerals (MULTIVITAMIN WITH MINERALS) tablet Take 1 tablet by mouth daily.     Omega-3 Fatty Acids (FISH OIL) 1000 MG CAPS Take 1 capsule by mouth daily.      pantoprazole (PROTONIX) 40 MG tablet TAKE ONE TABLET BY MOUTH DAILY 90 tablet 3   rosuvastatin (CRESTOR) 20 MG tablet TAKE ONE TABLET BY MOUTH DAILY 90 tablet 3   SIMPONI ARIA 50 MG/4ML SOLN injection Inject 50 mg into the vein. Every 60 Days     Na Sulfate-K Sulfate-Mg Sulf (SUPREP BOWEL PREP KIT) 17.5-3.13-1.6 GM/177ML SOLN Take 1 kit by mouth as directed. (Patient not taking: Reported on 12/08/2020) 324 mL 0   No current facility-administered medications for this visit.    Family History  Problem Relation Age of Onset   Kidney cancer Father 50   Pneumonia Father    Hypertension Father    Hypertension Mother    Deep vein thrombosis Mother    Alzheimer's disease Mother    Osteopenia Mother    Heart attack Maternal Grandfather    Breast cancer Sister 67       recurrence 80   Allergies Brother    Breast cancer Sister 25       Stage 0   Breast cancer Sister 2       Stage 3, bilateral mastectomy   Heart attack Brother 70   Deep vein thrombosis Sister            Pancreatic cancer Paternal Aunt 35   Breast cancer Maternal Aunt 67   Prostate cancer Maternal Uncle 68   Pancreatic cancer Paternal Uncle 21   Uterine cancer Paternal Grandmother 87   Colon cancer Paternal Grandfather 9   Pancreatic cancer Paternal Aunt 54   Lung cancer Paternal Uncle 58   Brain cancer Paternal Aunt    Kidney cancer Cousin 45   Kidney  cancer Cousin 52   Uterine cancer Cousin 62   Prostate cancer Cousin 57   Lung cancer Maternal Uncle 60   Breast cancer Cousin 60   Thyroid disease Neg Hx     Review of Systems  Constitutional: Negative.   Gastrointestinal:  Positive for abdominal distention.  Genitourinary: Negative.    Exam:   BP 131/75   Pulse (!) 49   Ht 5' 5.75" (1.67 m)   Wt 216 lb 6.4 oz (98.2 kg)   LMP 03/19/1998 Comment: h/o supracervical hysterectomy  BMI 35.19 kg/m   Height: 5' 5.75" (167 cm)  General appearance: alert, cooperative and appears stated age Breasts: normal appearance, no masses or tenderness Abdomen: soft, non-tender; bowel sounds normal; no masses,  no organomegaly Lymph nodes: Cervical, supraclavicular, and axillary nodes normal.  No abnormal inguinal nodes palpated Neurologic: Grossly normal  Pelvic: External genitalia:  no lesions              Urethra:  normal appearing urethra with no masses, tenderness or lesions              Bartholins and Skenes: normal                 Vagina: normal appearing vagina with atrophic changes and no discharge, no lesions              Cervix: no lesions              Pap taken: Yes.   Bimanual Exam:  Uterus:  uterus absent              Adnexa: no mass, fullness, tenderness               Rectovaginal: Confirms               Anus:  normal sphincter tone, no lesions  Chaperone, Octaviano Batty, CMA, was present for exam.  Assessment/Plan: 1. Encounter for gynecological examination without abnormal finding - pap smear obtained today - MMG 04/2020 - BMD 2021, normal - colonoscopy is scheduled for next month - care Gaps reviewed/updated  2. Postmenopausal - no HRT  3. Family history of breast cancer  4. H/O abdominal supracervical subtotal hysterectomy  5. Lynch syndrome - US PELVIS TRANSVAGINAL NON-OB (TV ONLY); Future - CA 125 - Cytology - non gyn (urine) - discussed again guidelines given her gene mutation which includes BSO.  She does  not feel she is in a place, medically, yet, where she can proceed.  Is willing to have testing for monitoring at this time  6. Other intra-abdominal and pelvic swelling, mass and lump

## 2020-12-08 NOTE — Telephone Encounter (Signed)
Patient advised that she has been given clearance to hold Plavix 5 days prior to colonoscopy scheduled for 12-26-20.  Patient advised to remain on Aspirin.  Patient advised to take last dose of Plavix on 12-20-20, and she will be advised when to restart Plavix by Dr Ardis Hughs after the procedure.  Patient agreed to plan and verbalized understanding.  No further questions.

## 2020-12-09 LAB — CA 125: Cancer Antigen (CA) 125: 8.8 U/mL (ref 0.0–38.1)

## 2020-12-12 LAB — CYTOLOGY - NON PAP

## 2020-12-13 DIAGNOSIS — Z79899 Other long term (current) drug therapy: Secondary | ICD-10-CM | POA: Diagnosis not present

## 2020-12-13 DIAGNOSIS — R768 Other specified abnormal immunological findings in serum: Secondary | ICD-10-CM | POA: Diagnosis not present

## 2020-12-13 DIAGNOSIS — L403 Pustulosis palmaris et plantaris: Secondary | ICD-10-CM | POA: Diagnosis not present

## 2020-12-13 DIAGNOSIS — E669 Obesity, unspecified: Secondary | ICD-10-CM | POA: Diagnosis not present

## 2020-12-13 DIAGNOSIS — H209 Unspecified iridocyclitis: Secondary | ICD-10-CM | POA: Diagnosis not present

## 2020-12-13 DIAGNOSIS — Z6834 Body mass index (BMI) 34.0-34.9, adult: Secondary | ICD-10-CM | POA: Diagnosis not present

## 2020-12-13 DIAGNOSIS — M0589 Other rheumatoid arthritis with rheumatoid factor of multiple sites: Secondary | ICD-10-CM | POA: Diagnosis not present

## 2020-12-13 LAB — CYTOLOGY - PAP
Comment: NEGATIVE
Diagnosis: NEGATIVE
High risk HPV: NEGATIVE

## 2020-12-14 DIAGNOSIS — M0589 Other rheumatoid arthritis with rheumatoid factor of multiple sites: Secondary | ICD-10-CM | POA: Diagnosis not present

## 2020-12-14 DIAGNOSIS — Z79899 Other long term (current) drug therapy: Secondary | ICD-10-CM | POA: Diagnosis not present

## 2020-12-16 ENCOUNTER — Other Ambulatory Visit: Payer: Self-pay | Admitting: Interventional Cardiology

## 2020-12-22 DIAGNOSIS — H401133 Primary open-angle glaucoma, bilateral, severe stage: Secondary | ICD-10-CM | POA: Diagnosis not present

## 2020-12-22 DIAGNOSIS — D443 Neoplasm of uncertain behavior of pituitary gland: Secondary | ICD-10-CM | POA: Diagnosis not present

## 2020-12-26 ENCOUNTER — Encounter: Payer: Self-pay | Admitting: Gastroenterology

## 2020-12-26 ENCOUNTER — Other Ambulatory Visit: Payer: Self-pay

## 2020-12-26 ENCOUNTER — Ambulatory Visit (AMBULATORY_SURGERY_CENTER): Payer: Medicare Other | Admitting: Gastroenterology

## 2020-12-26 VITALS — BP 133/67 | HR 48 | Temp 96.0°F | Resp 12 | Ht 67.0 in | Wt 219.0 lb

## 2020-12-26 DIAGNOSIS — Z86010 Personal history of colonic polyps: Secondary | ICD-10-CM | POA: Diagnosis not present

## 2020-12-26 DIAGNOSIS — K589 Irritable bowel syndrome without diarrhea: Secondary | ICD-10-CM

## 2020-12-26 DIAGNOSIS — Z1509 Genetic susceptibility to other malignant neoplasm: Secondary | ICD-10-CM | POA: Diagnosis not present

## 2020-12-26 DIAGNOSIS — D124 Benign neoplasm of descending colon: Secondary | ICD-10-CM

## 2020-12-26 MED ORDER — SODIUM CHLORIDE 0.9 % IV SOLN: 500.0000 mL | Freq: Once | INTRAVENOUS | Status: DC

## 2020-12-26 NOTE — Progress Notes (Signed)
Called to room to assist during endoscopic procedure.  Patient ID and intended procedure confirmed with present staff. Received instructions for my participation in the procedure from the performing physician.  

## 2020-12-26 NOTE — Progress Notes (Signed)
VS completed by DT.    Medical history reviewed and updated.  

## 2020-12-26 NOTE — Progress Notes (Signed)
To pacu VSS. Report to [email or web address] 

## 2020-12-26 NOTE — Patient Instructions (Signed)
YOU HAD AN ENDOSCOPIC PROCEDURE TODAY AT [facility]:   Refer to the procedure report that was given to you for any specific questions about what was found during the examination.  If the procedure report does not answer your questions, please call your gastroenterologist to clarify.  If you requested that your care partner not be given the details of your procedure findings, then the procedure report has been included in a sealed envelope for you to review at your convenience later.  You may resume your plavix today.  You will need another colonoscopy in 1 yr.  Read all of the handouts given to you by your recovery room nurse.  YOU SHOULD EXPECT: Some feelings of bloating in the abdomen. Passage of more gas than usual.  Walking can help get rid of the air that was put into your GI tract during the procedure and reduce the bloating. If you had a lower endoscopy (such as a colonoscopy or flexible sigmoidoscopy) you may notice spotting of blood in your stool or on the toilet paper. If you underwent a bowel prep for your procedure, you may not have a normal bowel movement for a few days.  Please Note:  You might notice some irritation and congestion in your nose or some drainage.  This is from the oxygen used during your procedure.  There is no need for concern and it should clear up in a day or so.  SYMPTOMS TO REPORT IMMEDIATELY:  Following lower endoscopy (colonoscopy or flexible sigmoidoscopy):  Excessive amounts of blood in the stool  Significant tenderness or worsening of abdominal pains  Swelling of the abdomen that is new, acute  Fever of 100F or higher   For urgent or emergent issues, a gastroenterologist can be reached at any hour by calling [phone]. Do not use MyChart messaging for urgent concerns.    DIET:  We do recommend a small meal at first, but then you may proceed to your regular diet.  Drink plenty of fluids but you should avoid alcoholic beverages for 24  hours.  ACTIVITY:  You should plan to take it easy for the rest of today and you should NOT DRIVE or use heavy machinery until tomorrow (because of the sedation medicines used during the test).    FOLLOW UP: Our staff will call the number listed on your records 48-72 hours following your procedure to check on you and address any questions or concerns that you may have regarding the information given to you following your procedure. If we do not reach you, we will leave a message.  We will attempt to reach you two times.  During this call, we will ask if you have developed any symptoms of COVID 19. If you develop any symptoms (ie: fever, flu-like symptoms, shortness of breath, cough etc.) before then, please call [phone].  If you test positive for Covid 19 in the 2 weeks post procedure, please call and report this information to [place].    If any biopsies were taken you will be contacted by phone or by letter within the next 1-3 weeks.  Please call us at [phone] if you have not heard about the biopsies in 3 weeks.    SIGNATURES/CONFIDENTIALITY: You and/or your care partner have signed paperwork which will be entered into your electronic medical record.  These signatures attest to the fact that that the information above on your After Visit Summary has been reviewed and is understood.  Full responsibility of the  confidentiality of this discharge information lies with you and/or your care-partner.  

## 2020-12-26 NOTE — Progress Notes (Signed)
  The recent H&P (dated 12/06/2020) was reviewed, the patient was examined and there is no change in the patients condition since that H&P was completed.   Milus Banister  12/26/2020, 1:18 PM

## 2020-12-26 NOTE — Op Note (Signed)
[name] Patient Name: Pamela Lowe Procedure Date: 12/26/2020 1:28 PM MRN: [id number] Endoscopist: Milus Banister , MD Age: 70 Referring MD:  Date of Birth: 07/29/1950 Gender: Female Account #: [id number] Procedure:                Colonoscopy Indications:              High risk colon cancer surveillance: Lynch                            syndrome. MLH1 mutation. EGD February 2020 showed                            mild gastritis, H. pylori negative on biopsies.                            Colonoscopy February 2020 showed left-sided                            diverticulosis and a 2 mm tubular adenoma Medicines:                Monitored Anesthesia Care Procedure:                Pre-Anesthesia Assessment:                           - Prior to the procedure, a History and Physical                            was performed, and patient medications and                            allergies were reviewed. The patient's tolerance of                            previous anesthesia was also reviewed. The risks                            and benefits of the procedure and the sedation                            options and risks were discussed with the patient.                            All questions were answered, and informed consent                            was obtained. Prior Anticoagulants: The patient has                            taken Plavix (clopidogrel), last dose was 5 days                            prior to procedure. ASA Grade Assessment: III - A  patient with severe systemic disease. After                            reviewing the risks and benefits, the patient was                            deemed in satisfactory condition to undergo the                            procedure.                           After obtaining informed consent, the colonoscope                            was passed under direct vision. Throughout the                             procedure, the patient's blood pressure, pulse, and                            oxygen saturations were monitored continuously. The                            Olympus CF-HQ190L (Serial# [id number]) Colonoscope was                            introduced through the anus and advanced to the the                            cecum, identified by appendiceal orifice and                            ileocecal valve. The colonoscopy was performed                            without difficulty. The patient tolerated the                            procedure well. The quality of the bowel                            preparation was good. The ileocecal valve,                            appendiceal orifice, and rectum were photographed. Scope In: 1:36:19 PM Scope Out: 1:48:04 PM Scope Withdrawal Time: 0 hours 8 minutes 11 seconds  Total Procedure Duration: 0 hours 11 minutes 45 seconds  Findings:                 A 3 mm polyp was found in the descending colon. The                            polyp was sessile. The polyp was removed with a  cold snare. Resection and retrieval were complete.                           Multiple small and large-mouthed diverticula were                            found in the left colon.                           Internal hemorrhoids were found. The hemorrhoids                            were small.                           The exam was otherwise without abnormality on                            direct and retroflexion views. Complications:            No immediate complications. Estimated blood loss:                            None. Estimated Blood Loss:     Estimated blood loss: none. Impression:               - One 3 mm polyp in the descending colon, removed                            with a cold snare. Resected and retrieved.                           - Diverticulosis in the left colon.                           - Internal hemorrhoids.                            - The examination was otherwise normal on direct                            and retroflexion views. Recommendation:           - Patient has a contact number available for                            emergencies. The signs and symptoms of potential                            delayed complications were discussed with the                            patient. Return to normal activities tomorrow.                            Written discharge instructions were provided to the  patient.                           - Resume previous diet.                           - Continue present medications. You can resume your                            plavix today.                           - Await pathology results. Likely repeat                            colonoscopy in 1 year (same time EGD). Milus Banister, MD 12/26/2020 1:52:19 PM This report has been signed electronically.

## 2020-12-27 DIAGNOSIS — E669 Obesity, unspecified: Secondary | ICD-10-CM | POA: Diagnosis not present

## 2020-12-27 DIAGNOSIS — Z6837 Body mass index (BMI) 37.0-37.9, adult: Secondary | ICD-10-CM | POA: Diagnosis not present

## 2020-12-28 ENCOUNTER — Telehealth: Payer: Self-pay

## 2020-12-28 NOTE — Telephone Encounter (Signed)
  Follow up Call-  Call back number 12/26/2020 05/07/2018  Post procedure Call Back phone  # [phone]  Permission to leave phone message Yes Yes  Some recent data might be hidden     Patient questions:  Do you have a fever, pain , or abdominal swelling? No. Pain Score  0 *  Have you tolerated food without any problems? Yes.    Have you been able to return to your normal activities? Yes.    Do you have any questions about your discharge instructions: Diet   No. Medications  No. Follow up visit  No.  Do you have questions or concerns about your Care? No.  Actions: * If pain score is 4 or above: No action needed, pain <4.  Have you developed a fever since your procedure? no  2.   Have you had an respiratory symptoms (SOB or cough) since your procedure? non  3.   Have you tested positive for COVID 19 since your procedure o  4.   Have you had any family members/close contacts diagnosed with the COVID 19 since your procedure?  no   If yes to any of these questions please route to Joylene John, RN and Joella Prince, RN

## 2021-01-03 ENCOUNTER — Encounter: Payer: Medicare Other | Admitting: Gastroenterology

## 2021-01-04 ENCOUNTER — Other Ambulatory Visit: Payer: Self-pay

## 2021-01-04 ENCOUNTER — Encounter: Payer: Self-pay | Admitting: Gastroenterology

## 2021-01-04 ENCOUNTER — Ambulatory Visit (INDEPENDENT_AMBULATORY_CARE_PROVIDER_SITE_OTHER): Payer: Medicare Other

## 2021-01-04 DIAGNOSIS — R1909 Other intra-abdominal and pelvic swelling, mass and lump: Secondary | ICD-10-CM | POA: Diagnosis not present

## 2021-01-04 DIAGNOSIS — Z1509 Genetic susceptibility to other malignant neoplasm: Secondary | ICD-10-CM

## 2021-01-10 ENCOUNTER — Other Ambulatory Visit: Payer: Self-pay | Admitting: Interventional Cardiology

## 2021-01-13 DIAGNOSIS — S83241A Other tear of medial meniscus, current injury, right knee, initial encounter: Secondary | ICD-10-CM | POA: Diagnosis not present

## 2021-01-13 DIAGNOSIS — M1711 Unilateral primary osteoarthritis, right knee: Secondary | ICD-10-CM | POA: Diagnosis not present

## 2021-01-17 ENCOUNTER — Ambulatory Visit: Payer: Medicare Other | Admitting: Neurology

## 2021-01-23 DIAGNOSIS — Z6835 Body mass index (BMI) 35.0-35.9, adult: Secondary | ICD-10-CM | POA: Diagnosis not present

## 2021-01-23 DIAGNOSIS — E669 Obesity, unspecified: Secondary | ICD-10-CM | POA: Diagnosis not present

## 2021-01-25 ENCOUNTER — Other Ambulatory Visit (HOSPITAL_BASED_OUTPATIENT_CLINIC_OR_DEPARTMENT_OTHER): Payer: Medicare Other

## 2021-01-25 ENCOUNTER — Encounter (HOSPITAL_BASED_OUTPATIENT_CLINIC_OR_DEPARTMENT_OTHER): Payer: Self-pay

## 2021-01-25 ENCOUNTER — Ambulatory Visit (HOSPITAL_BASED_OUTPATIENT_CLINIC_OR_DEPARTMENT_OTHER): Payer: Medicare Other | Admitting: Obstetrics & Gynecology

## 2021-01-26 DIAGNOSIS — R001 Bradycardia, unspecified: Secondary | ICD-10-CM | POA: Diagnosis not present

## 2021-01-26 DIAGNOSIS — R9431 Abnormal electrocardiogram [ECG] [EKG]: Secondary | ICD-10-CM | POA: Diagnosis not present

## 2021-01-26 DIAGNOSIS — R221 Localized swelling, mass and lump, neck: Secondary | ICD-10-CM | POA: Diagnosis not present

## 2021-01-26 DIAGNOSIS — J342 Deviated nasal septum: Secondary | ICD-10-CM | POA: Diagnosis not present

## 2021-01-26 DIAGNOSIS — Z955 Presence of coronary angioplasty implant and graft: Secondary | ICD-10-CM | POA: Diagnosis not present

## 2021-01-26 DIAGNOSIS — I2510 Atherosclerotic heart disease of native coronary artery without angina pectoris: Secondary | ICD-10-CM | POA: Diagnosis not present

## 2021-01-26 DIAGNOSIS — H4041X3 Glaucoma secondary to eye inflammation, right eye, severe stage: Secondary | ICD-10-CM | POA: Diagnosis not present

## 2021-01-26 DIAGNOSIS — R2232 Localized swelling, mass and lump, left upper limb: Secondary | ICD-10-CM | POA: Diagnosis not present

## 2021-01-26 DIAGNOSIS — Z961 Presence of intraocular lens: Secondary | ICD-10-CM | POA: Diagnosis not present

## 2021-01-26 DIAGNOSIS — D329 Benign neoplasm of meninges, unspecified: Secondary | ICD-10-CM | POA: Diagnosis not present

## 2021-01-26 DIAGNOSIS — Z01818 Encounter for other preprocedural examination: Secondary | ICD-10-CM | POA: Diagnosis not present

## 2021-01-26 DIAGNOSIS — Z87891 Personal history of nicotine dependence: Secondary | ICD-10-CM | POA: Diagnosis not present

## 2021-01-26 DIAGNOSIS — Z7902 Long term (current) use of antithrombotics/antiplatelets: Secondary | ICD-10-CM | POA: Diagnosis not present

## 2021-01-26 DIAGNOSIS — H209 Unspecified iridocyclitis: Secondary | ICD-10-CM | POA: Diagnosis not present

## 2021-01-27 ENCOUNTER — Other Ambulatory Visit (HOSPITAL_BASED_OUTPATIENT_CLINIC_OR_DEPARTMENT_OTHER): Payer: Self-pay

## 2021-01-27 ENCOUNTER — Ambulatory Visit: Payer: Medicare Other | Attending: Internal Medicine

## 2021-01-27 DIAGNOSIS — Z23 Encounter for immunization: Secondary | ICD-10-CM

## 2021-01-27 MED ORDER — PFIZER COVID-19 VAC BIVALENT 30 MCG/0.3ML IM SUSP
INTRAMUSCULAR | 0 refills | Status: DC
  Filled 2021-01-27: qty 0.3, 1d supply, fill #0

## 2021-01-27 NOTE — Progress Notes (Signed)
   Covid-19 Vaccination Clinic  Name:  Pamela Lowe    MRN: [id number] DOB: 14-May-1950  01/27/2021  [name] was observed post Covid-19 immunization for 15 minutes without incident. She was provided with Vaccine Information Sheet and instruction to access the V-Safe system.   [name] was instructed to call 911 with any severe reactions post vaccine: Difficulty breathing  Swelling of face and throat  A fast heartbeat  A bad rash all over body  Dizziness and weakness   Immunizations Administered     Name Date Dose VIS Date Route   Pfizer Covid-19 Vaccine Bivalent Booster 01/27/2021 10:05 AM 0.3 mL 11/16/2020 Intramuscular   Manufacturer: [place]   Lot: [id number]   [place]: [phone]

## 2021-01-31 DIAGNOSIS — M25561 Pain in right knee: Secondary | ICD-10-CM | POA: Diagnosis not present

## 2021-02-10 ENCOUNTER — Other Ambulatory Visit: Payer: Self-pay | Admitting: Interventional Cardiology

## 2021-02-14 DIAGNOSIS — H209 Unspecified iridocyclitis: Secondary | ICD-10-CM | POA: Diagnosis not present

## 2021-02-14 DIAGNOSIS — Z79899 Other long term (current) drug therapy: Secondary | ICD-10-CM | POA: Diagnosis not present

## 2021-02-14 DIAGNOSIS — H4041X3 Glaucoma secondary to eye inflammation, right eye, severe stage: Secondary | ICD-10-CM | POA: Diagnosis not present

## 2021-02-14 DIAGNOSIS — H4042X1 Glaucoma secondary to eye inflammation, left eye, mild stage: Secondary | ICD-10-CM | POA: Diagnosis not present

## 2021-02-21 DIAGNOSIS — E669 Obesity, unspecified: Secondary | ICD-10-CM | POA: Diagnosis not present

## 2021-02-21 DIAGNOSIS — Z6835 Body mass index (BMI) 35.0-35.9, adult: Secondary | ICD-10-CM | POA: Diagnosis not present

## 2021-02-22 DIAGNOSIS — D329 Benign neoplasm of meninges, unspecified: Secondary | ICD-10-CM | POA: Diagnosis not present

## 2021-02-22 DIAGNOSIS — I2510 Atherosclerotic heart disease of native coronary artery without angina pectoris: Secondary | ICD-10-CM | POA: Diagnosis present

## 2021-02-22 DIAGNOSIS — H5462 Unqualified visual loss, left eye, normal vision right eye: Secondary | ICD-10-CM | POA: Diagnosis present

## 2021-02-22 DIAGNOSIS — M069 Rheumatoid arthritis, unspecified: Secondary | ICD-10-CM | POA: Diagnosis present

## 2021-02-22 DIAGNOSIS — H539 Unspecified visual disturbance: Secondary | ICD-10-CM | POA: Diagnosis not present

## 2021-02-22 DIAGNOSIS — E7800 Pure hypercholesterolemia, unspecified: Secondary | ICD-10-CM | POA: Diagnosis present

## 2021-02-22 DIAGNOSIS — K219 Gastro-esophageal reflux disease without esophagitis: Secondary | ICD-10-CM | POA: Diagnosis present

## 2021-02-22 DIAGNOSIS — G9600 Cerebrospinal fluid leak, unspecified: Secondary | ICD-10-CM | POA: Diagnosis not present

## 2021-02-22 DIAGNOSIS — I10 Essential (primary) hypertension: Secondary | ICD-10-CM | POA: Diagnosis present

## 2021-02-22 DIAGNOSIS — R0982 Postnasal drip: Secondary | ICD-10-CM | POA: Diagnosis not present

## 2021-02-22 DIAGNOSIS — H53453 Other localized visual field defect, bilateral: Secondary | ICD-10-CM | POA: Diagnosis not present

## 2021-02-22 DIAGNOSIS — Z961 Presence of intraocular lens: Secondary | ICD-10-CM | POA: Diagnosis present

## 2021-02-22 DIAGNOSIS — Z955 Presence of coronary angioplasty implant and graft: Secondary | ICD-10-CM | POA: Diagnosis not present

## 2021-02-22 DIAGNOSIS — H409 Unspecified glaucoma: Secondary | ICD-10-CM | POA: Diagnosis present

## 2021-02-22 DIAGNOSIS — I739 Peripheral vascular disease, unspecified: Secondary | ICD-10-CM | POA: Diagnosis present

## 2021-02-22 DIAGNOSIS — D320 Benign neoplasm of cerebral meninges: Secondary | ICD-10-CM | POA: Diagnosis present

## 2021-02-22 DIAGNOSIS — J310 Chronic rhinitis: Secondary | ICD-10-CM | POA: Diagnosis not present

## 2021-02-22 DIAGNOSIS — Z7952 Long term (current) use of systemic steroids: Secondary | ICD-10-CM | POA: Diagnosis not present

## 2021-02-22 DIAGNOSIS — H53451 Other localized visual field defect, right eye: Secondary | ICD-10-CM | POA: Diagnosis not present

## 2021-02-22 DIAGNOSIS — Z20822 Contact with and (suspected) exposure to covid-19: Secondary | ICD-10-CM | POA: Diagnosis present

## 2021-02-22 DIAGNOSIS — Z87891 Personal history of nicotine dependence: Secondary | ICD-10-CM | POA: Diagnosis not present

## 2021-02-22 DIAGNOSIS — Z888 Allergy status to other drugs, medicaments and biological substances status: Secondary | ICD-10-CM | POA: Diagnosis not present

## 2021-02-22 DIAGNOSIS — J3489 Other specified disorders of nose and nasal sinuses: Secondary | ICD-10-CM | POA: Diagnosis not present

## 2021-02-24 ENCOUNTER — Other Ambulatory Visit: Payer: Self-pay

## 2021-02-24 DIAGNOSIS — D320 Benign neoplasm of cerebral meninges: Secondary | ICD-10-CM | POA: Diagnosis present

## 2021-02-24 DIAGNOSIS — K219 Gastro-esophageal reflux disease without esophagitis: Secondary | ICD-10-CM | POA: Diagnosis present

## 2021-02-24 DIAGNOSIS — I10 Essential (primary) hypertension: Secondary | ICD-10-CM | POA: Diagnosis present

## 2021-02-24 DIAGNOSIS — M069 Rheumatoid arthritis, unspecified: Secondary | ICD-10-CM | POA: Diagnosis present

## 2021-02-24 DIAGNOSIS — Z961 Presence of intraocular lens: Secondary | ICD-10-CM | POA: Diagnosis present

## 2021-02-24 DIAGNOSIS — Z87891 Personal history of nicotine dependence: Secondary | ICD-10-CM | POA: Diagnosis not present

## 2021-02-24 DIAGNOSIS — H53453 Other localized visual field defect, bilateral: Secondary | ICD-10-CM | POA: Diagnosis not present

## 2021-02-24 DIAGNOSIS — Z7952 Long term (current) use of systemic steroids: Secondary | ICD-10-CM | POA: Diagnosis not present

## 2021-02-24 DIAGNOSIS — H53451 Other localized visual field defect, right eye: Secondary | ICD-10-CM | POA: Diagnosis not present

## 2021-02-24 DIAGNOSIS — I2510 Atherosclerotic heart disease of native coronary artery without angina pectoris: Secondary | ICD-10-CM | POA: Diagnosis present

## 2021-02-24 DIAGNOSIS — Z955 Presence of coronary angioplasty implant and graft: Secondary | ICD-10-CM | POA: Diagnosis not present

## 2021-02-24 DIAGNOSIS — H409 Unspecified glaucoma: Secondary | ICD-10-CM | POA: Diagnosis present

## 2021-02-24 DIAGNOSIS — Z20822 Contact with and (suspected) exposure to covid-19: Secondary | ICD-10-CM | POA: Diagnosis present

## 2021-02-24 DIAGNOSIS — G9600 Cerebrospinal fluid leak, unspecified: Secondary | ICD-10-CM | POA: Diagnosis not present

## 2021-02-24 DIAGNOSIS — H5462 Unqualified visual loss, left eye, normal vision right eye: Secondary | ICD-10-CM | POA: Diagnosis present

## 2021-02-24 DIAGNOSIS — D329 Benign neoplasm of meninges, unspecified: Secondary | ICD-10-CM | POA: Diagnosis not present

## 2021-02-24 DIAGNOSIS — I739 Peripheral vascular disease, unspecified: Secondary | ICD-10-CM | POA: Diagnosis present

## 2021-02-24 DIAGNOSIS — Z888 Allergy status to other drugs, medicaments and biological substances status: Secondary | ICD-10-CM | POA: Diagnosis not present

## 2021-02-24 DIAGNOSIS — E7800 Pure hypercholesterolemia, unspecified: Secondary | ICD-10-CM | POA: Diagnosis present

## 2021-02-24 HISTORY — PX: BRAIN TUMOR EXCISION: SHX577

## 2021-02-24 MED ORDER — BENAZEPRIL HCL 20 MG PO TABS: 20.0000 mg | ORAL_TABLET | Freq: Every day | ORAL | 2 refills | Status: DC

## 2021-02-24 MED ORDER — EZETIMIBE 10 MG PO TABS: 10.0000 mg | ORAL_TABLET | Freq: Every day | ORAL | 2 refills | Status: DC

## 2021-02-27 DIAGNOSIS — D329 Benign neoplasm of meninges, unspecified: Secondary | ICD-10-CM | POA: Diagnosis not present

## 2021-02-27 DIAGNOSIS — H53453 Other localized visual field defect, bilateral: Secondary | ICD-10-CM | POA: Diagnosis not present

## 2021-03-06 DIAGNOSIS — H5347 Heteronymous bilateral field defects: Secondary | ICD-10-CM | POA: Insufficient documentation

## 2021-03-06 DIAGNOSIS — Z9889 Other specified postprocedural states: Secondary | ICD-10-CM | POA: Insufficient documentation

## 2021-03-06 DIAGNOSIS — H4041X3 Glaucoma secondary to eye inflammation, right eye, severe stage: Secondary | ICD-10-CM | POA: Diagnosis not present

## 2021-03-06 DIAGNOSIS — H209 Unspecified iridocyclitis: Secondary | ICD-10-CM | POA: Diagnosis not present

## 2021-03-06 DIAGNOSIS — H4042X1 Glaucoma secondary to eye inflammation, left eye, mild stage: Secondary | ICD-10-CM | POA: Diagnosis not present

## 2021-03-11 ENCOUNTER — Encounter: Payer: Self-pay | Admitting: Interventional Cardiology

## 2021-03-11 ENCOUNTER — Encounter: Payer: Self-pay | Admitting: Gastroenterology

## 2021-03-14 DIAGNOSIS — R41842 Visuospatial deficit: Secondary | ICD-10-CM | POA: Diagnosis not present

## 2021-03-14 DIAGNOSIS — D329 Benign neoplasm of meninges, unspecified: Secondary | ICD-10-CM | POA: Diagnosis not present

## 2021-03-14 MED ORDER — METOPROLOL SUCCINATE ER 25 MG PO TB24: 25.0000 mg | ORAL_TABLET | Freq: Every day | ORAL | 3 refills | Status: DC

## 2021-03-14 MED ORDER — CLOPIDOGREL BISULFATE 75 MG PO TABS: 75.0000 mg | ORAL_TABLET | Freq: Every day | ORAL | 3 refills | Status: DC

## 2021-03-14 MED ORDER — HYDROCHLOROTHIAZIDE 12.5 MG PO CAPS: 12.5000 mg | ORAL_CAPSULE | ORAL | 3 refills | Status: DC

## 2021-03-16 DIAGNOSIS — H04123 Dry eye syndrome of bilateral lacrimal glands: Secondary | ICD-10-CM | POA: Diagnosis not present

## 2021-03-16 DIAGNOSIS — H401133 Primary open-angle glaucoma, bilateral, severe stage: Secondary | ICD-10-CM | POA: Diagnosis not present

## 2021-03-16 DIAGNOSIS — H20023 Recurrent acute iridocyclitis, bilateral: Secondary | ICD-10-CM | POA: Diagnosis not present

## 2021-03-16 DIAGNOSIS — H40043 Steroid responder, bilateral: Secondary | ICD-10-CM | POA: Diagnosis not present

## 2021-03-18 ENCOUNTER — Other Ambulatory Visit: Payer: Self-pay | Admitting: Interventional Cardiology

## 2021-03-21 ENCOUNTER — Other Ambulatory Visit: Payer: Self-pay

## 2021-03-21 ENCOUNTER — Ambulatory Visit (INDEPENDENT_AMBULATORY_CARE_PROVIDER_SITE_OTHER): Payer: Medicare Other | Admitting: Neurology

## 2021-03-21 ENCOUNTER — Encounter: Payer: Self-pay | Admitting: Neurology

## 2021-03-21 VITALS — BP 106/73 | HR 75 | Ht 67.0 in | Wt 201.5 lb

## 2021-03-21 DIAGNOSIS — M5412 Radiculopathy, cervical region: Secondary | ICD-10-CM | POA: Diagnosis not present

## 2021-03-21 DIAGNOSIS — D329 Benign neoplasm of meninges, unspecified: Secondary | ICD-10-CM | POA: Insufficient documentation

## 2021-03-21 DIAGNOSIS — M25512 Pain in left shoulder: Secondary | ICD-10-CM

## 2021-03-21 DIAGNOSIS — H5340 Unspecified visual field defects: Secondary | ICD-10-CM

## 2021-03-21 DIAGNOSIS — G8929 Other chronic pain: Secondary | ICD-10-CM | POA: Insufficient documentation

## 2021-03-21 NOTE — Progress Notes (Signed)
[facility]  PATIENT: Pamela Lowe DOB: [birth year]  REFERRING DOCTOR OR PCP: Lona Kettle MD; Lazaro Arms, PA-C; Dr. Theda Sers [facility]) SOURCE: Patient, notes from PCP, imaging reports, MRI images personally reviewed.  _________________________________   HISTORICAL  CHIEF COMPLAINT:  Chief Complaint  Patient presents with   Follow-up    Rm 1, alone. Pt reports having meningoma removal surgery 12/9 and discharged 12/13 for. Overall doing well.     HISTORY OF PRESENT ILLNESS:  [name] is a 71 y.o. woman with a suprasellar meningioma (resected 02/24/2021), , visual changes and shoulder and arm pain and left arm numbness.  Update 03/21/2021: She is doing well with no shoulder pain or arm pain now.      She had an MRI performed 08/25/2020 that shows severe bilateral neural foraminal narrowing at C4C5 affecting the C5 nerve roots and changes at C5-C6 that could affect the left C6 nerve root.  At the last visit, we did left subacromial bursa injection and pain has been much better since.  She feels range of motion is normal in the shoulder now.  I had noticed a sellar mass on the cervical spine MRI and a pituitary MRI was performed confirming the mass and that it was compressing the optic nerves.  She had the suprasellar mass resected at [place] (02/24/2021 Dr. Mervyn Gay - [facility], [facility]).  It turned out o be a suprasellar meningioma.   It was displacing the optic nerves.   S Because she has uveitis and glaucoma, visual changes were felt to be related to her existing problems initially.  The pathology was meningioma WHO grade I.     VF is worse with bi-temporal field cuts.   She will be seeing a neuro-ophthalmologist as well.   She is on acetazolamide pills and eye drops.    Imaging reviewed today I personally reviewed the MRI of the cervical spine 08/25/2020.  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  The spine shows large disc  osteophyte complexes at C4-C5 and C5-C6.  At C4-C5, there is probable left and possible right C5 nerve root compression.  At C5-C6, there is potential left C6 nerve root compression.  There is also minimal retrolisthesis of C5 upon C6.  There are milder degenerative changes at C6-C7 more to the left that probably do not cause any nerve root compression.  Due to the large pituitary gland I also looked at the CT scan from 08/02/2019.  The pituitary gland was enlarged at that time as well.   REVIEW OF SYSTEMS: Constitutional: No fevers, chills, sweats, or change in appetite Eyes: No visual changes, double vision, eye pain Ear, nose and throat: No hearing loss, ear pain, nasal congestion, sore throat Cardiovascular: No chest pain, palpitations Respiratory:  No shortness of breath at rest or with exertion.   No wheezes GastrointestinaI: No nausea, vomiting, diarrhea, abdominal pain, fecal incontinence Genitourinary:  No dysuria, urinary retention or frequency.  No nocturia. Musculoskeletal:  No neck pain, back pain Integumentary: No rash, pruritus, skin lesions Neurological: as above Psychiatric: No depression at this time.  No anxiety Endocrine: No palpitations, diaphoresis, change in appetite, change in weigh or increased thirst Hematologic/Lymphatic:  No anemia, purpura, petechiae. Allergic/Immunologic: No itchy/runny eyes, nasal congestion, recent allergic reactions, rashes  ALLERGIES: Allergies  Allergen Reactions   Sulfamethoxazole-Trimethoprim Other (See Comments) and Rash    Chills/achy Flu like symptoms    Wellbutrin [Bupropion Hcl] Rash    rash  HOME MEDICATIONS:  Current Outpatient Medications:    acetaZOLAMIDE (DIAMOX) 250 MG tablet, Take 500 mg by mouth 2 (two) times daily., Disp: , Rfl:    aspirin 81 MG EC tablet, Take 81 mg by mouth every evening. , Disp: , Rfl:    benazepril (LOTENSIN) 20 MG tablet, Take 1 tablet (20 mg total) by mouth daily., Disp: 90 tablet, Rfl: 2    brimonidine (ALPHAGAN) 0.2 % ophthalmic solution, Place 1 drop into both eyes 3 (three) times daily. , Disp: , Rfl:    Calcium Carb-Cholecalciferol (CALCIUM-VITAMIN D) 600-400 MG-UNIT TABS, Take 1 tablet by mouth daily., Disp: , Rfl:    clopidogrel (PLAVIX) 75 MG tablet, Take 1 tablet (75 mg total) by mouth daily., Disp: 90 tablet, Rfl: 3   COLLAGEN-VITAMIN C-BIOTIN PO, Take by mouth., Disp: , Rfl:    COVID-19 mRNA bivalent vaccine, Pfizer, (PFIZER COVID-19 VAC BIVALENT) injection, Inject into the muscle., Disp: 0.3 mL, Rfl: 0   dorzolamide-timolol (COSOPT) 22.3-6.8 MG/ML ophthalmic solution, Place 1 drop into the right eye 2 (two) times daily., Disp: , Rfl:    ezetimibe (ZETIA) 10 MG tablet, Take 1 tablet (10 mg total) by mouth daily., Disp: 90 tablet, Rfl: 2   fexofenadine (ALLEGRA) 60 MG tablet, Take 60 mg by mouth as needed for allergies or rhinitis., Disp: , Rfl:    folic acid (FOLVITE) 1 MG tablet, Take 1 mg by mouth daily. , Disp: , Rfl:    hydrochlorothiazide (MICROZIDE) 12.5 MG capsule, Take 1 capsule (12.5 mg total) by mouth every Monday, Wednesday, and Friday., Disp: 45 capsule, Rfl: 3   Hyoscyamine Sulfate SL (LEVSIN/SL) 0.125 MG SUBL, Take 1 to 2 tablets sublingual (under the tongue) twice daily as needed., Disp: 50 tablet, Rfl: 0   ipratropium (ATROVENT) 0.06 % nasal spray, Place 2 sprays into both nostrils 3 (three) times daily., Disp: , Rfl:    methotrexate (RHEUMATREX) 2.5 MG tablet, Take by mouth once a week. Mondays Take 6 tablets, Disp: , Rfl:    metoprolol succinate (TOPROL-XL) 25 MG 24 hr tablet, Take 1 tablet by mouth once daily, Disp: 90 tablet, Rfl: 2   Multiple Vitamins-Minerals (MULTIVITAMIN WITH MINERALS) tablet, Take 1 tablet by mouth daily., Disp: , Rfl:    Omega-3 Fatty Acids (FISH OIL) 1000 MG CAPS, Take 1 capsule by mouth daily. , Disp: , Rfl:    pantoprazole (PROTONIX) 40 MG tablet, TAKE ONE TABLET BY MOUTH DAILY, Disp: 90 tablet, Rfl: 3   rosuvastatin (CRESTOR) 20  MG tablet, TAKE ONE TABLET BY MOUTH DAILY, Disp: 90 tablet, Rfl: 3   SIMPONI ARIA 50 MG/4ML SOLN injection, Inject 50 mg into the vein. Every 60 Days, Disp: , Rfl:   PAST MEDICAL HISTORY: Past Medical History:  Diagnosis Date   Allergic rhinitis    Brain tumor ([place])    Breast cyst 2007   Right   Cataract    surgery 07/2017   Depression 2006   w/ menopause   Family history of breast cancer    Family history of colon cancer    Family history of kidney cancer 01/15/2018   Family history of lung cancer    Family history of pancreatic cancer    Family history of prostate cancer    Family history of uterine cancer    GERD (gastroesophageal reflux disease)    Glaucoma    History of hysterectomy, supracervical    Hyperlipidemia    Hypertension    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   MLH1 [id number] (Splice  site)   Multinodular goiter    PAD (peripheral artery disease) ([place])    LE [place] 1/18: Occluded prox-mid R SFA // ABIs 9/22: R 0.83; L 1.0   Pustular psoriasis    Tobacco user     PAST SURGICAL HISTORY: Past Surgical History:  Procedure Laterality Date   BREAST BIOPSY Bilateral 1985   CARDIAC CATHETERIZATION     CATARACT EXTRACTION Right 07/2017   COLONOSCOPY     CORONARY STENT INTERVENTION N/A 12/26/2018   Procedure: CORONARY STENT INTERVENTION;  Surgeon: Belva Crome, MD;  Location: [facility];  Service: Cardiovascular;  Laterality: N/A;   LEFT HEART CATH AND CORONARY ANGIOGRAPHY N/A 12/17/2018   Procedure: LEFT HEART CATH AND CORONARY ANGIOGRAPHY;  Surgeon: Belva Crome, MD;  Location: [facility];  Service: Cardiovascular;  Laterality: N/A;   SUPRACERVICAL ABDOMINAL HYSTERECTOMY  1986   h/o supracervical hysterectomy   UPPER GASTROINTESTINAL ENDOSCOPY      FAMILY HISTORY: Family History  Problem Relation Age of Onset   Hypertension Mother    Deep vein thrombosis Mother    Alzheimer's disease Mother    Osteopenia Mother    Kidney cancer  Father 44   Pneumonia Father    Hypertension Father    Breast cancer Sister 73       recurrence 62   Breast cancer Sister 48       Stage 0   Breast cancer Sister 26       Stage 3, bilateral mastectomy   Deep vein thrombosis Sister            Allergies Brother    Heart attack Brother 22   Breast cancer Maternal Aunt 67   Prostate cancer Maternal Uncle 68   Lung cancer Maternal Uncle 60   Pancreatic cancer Paternal Aunt 64   Pancreatic cancer Paternal Aunt 58   Brain cancer Paternal Aunt    Pancreatic cancer Paternal Uncle 47   Lung cancer Paternal Uncle 86   Heart attack Maternal Grandfather    Uterine cancer Paternal Grandmother 58   Colon cancer Paternal Grandfather 75   Kidney cancer Cousin 45   Kidney cancer Cousin 52   Uterine cancer Cousin 62   Prostate cancer Cousin 57   Breast cancer Cousin 60   Thyroid disease Neg Hx    Rectal cancer Neg Hx    Stomach cancer Neg Hx     SOCIAL HISTORY:  Social History   Socioeconomic History   Marital status: Married    Spouse name: [name]   Number of children: 0   Years of education: Not on file   Highest education level: Master's degree (e.g., MA, MS, MEng, MEd, MSW, MBA)  Occupational History   Occupation: Special Ed Pharmacist, hospital    Comment: Engineer, manufacturing  Tobacco Use   Smoking status: Former    Packs/day: 1.50    Years: 39.00    Pack years: 58.50    Types: Cigarettes    Quit date: 08/01/2017    Years since quitting: 3.6   Smokeless tobacco: Never   Tobacco comments:    using nicoderm patches  Vaping Use   Vaping Use: Never used  Substance and Sexual Activity   Alcohol use: Not Currently   Drug use: No   Sexual activity: Yes    Partners: Male    Birth control/protection: Surgical    Comment: Hysterectomy  Other Topics Concern   Not on file  Social History Narrative   Not  on file   Social Determinants of Health   Financial Resource Strain: Not on file  Food Insecurity: Not on file  Transportation Needs:  Not on file  Physical Activity: Not on file  Stress: Not on file  Social Connections: Not on file  Intimate Partner Violence: Not on file     PHYSICAL EXAM  Vitals:   03/21/21 1134  BP: 106/73  Pulse: 75  Weight: 201 lb 8 oz (91.4 kg)  Height: _0  (1.702 m)    Body mass index is 31.56 kg/m.   General: The patient is well-developed and well-nourished and in no acute distress  HEENT:  Head is [place]/AT.  Sclera are anicteric.     Neck: No carotid bruits are noted.  The neck is nontender.  Cardiovascular: The heart has a regular rate and rhythm with a normal S1 and S2. There were no murmurs, gallops or rubs.    Skin: Extremities are without rash or  edema.  Musculoskeletal: The neck was fairly nontender but there was reduced range of motion.  She had some tenderness over the subacromial bursa of the left shoulder and had a reduced range of motion.  Neurologic Exam  Mental status: The patient is alert and oriented x 3 at the time of the examination. The patient has apparent normal recent and remote memory, with an apparently normal attention span and concentration ability.   Speech is normal.  Cranial nerves: Extraocular movements are full. Pupils are equal, round, and reactive to light and accomodation.  She had near absent  temporal visual field in the upper and lower quadrant on the right and lower quafrant worse than upper quadrant on the left.   Facial strength and sensation are fine.  . No obvious hearing deficits are noted.  Motor:  Muscle bulk is normal.   Tone is normal.  Strength was 4+/5 in the left biceps and deltoid and 5/5 elsewhere in the left arm.  Strength is  5 / 5 in the right arm and both legs.  Sensory: Sensory testing is intact to pinprick, soft touch and vibration sensation in all 4 extremities.  Coordination: Cerebellar testing reveals good finger-nose-finger and heel-to-shin bilaterally.  Gait and station: Station is normal.   Gait is normal. Tandem  gait is normal. Romberg is negative.   Reflexes: Deep tendon reflexes are symmetric and normal bilaterally.       DIAGNOSTIC DATA (LABS, IMAGING, TESTING) - I reviewed patient records, labs, notes, testing and imaging myself where available.  Lab Results  Component Value Date   WBC 7.2 11/02/2019   HGB 13.3 11/02/2019   HCT 42.2 11/02/2019   MCV 94.8 11/02/2019   PLT 303 11/02/2019      Component Value Date/Time   NA 141 11/02/2019 1814   NA 147 (H) 12/10/2018 1157   K 3.9 11/02/2019 1814   CL 105 11/02/2019 1814   CO2 28 11/02/2019 1814   GLUCOSE 97 11/02/2019 1814   BUN 16 11/02/2019 1814   BUN 19 12/10/2018 1157   CREATININE 1.07 (H) 11/02/2019 1814   CREATININE 1.41 (H) 12/16/2012 1554   CALCIUM 9.8 11/02/2019 1814   PROT 6.4 04/26/2020 1046   ALBUMIN 3.9 04/26/2020 1046   AST 32 04/26/2020 1046   ALT 35 (H) 04/26/2020 1046   ALKPHOS 54 04/26/2020 1046   BILITOT 0.7 04/26/2020 1046   GFRNONAA 53 (L) 11/02/2019 1814   GFRAA >60 11/02/2019 1814   Lab Results  Component Value Date   CHOL  175 03/21/2020   HDL 79 03/21/2020   LDLCALC 78 03/21/2020   TRIG 103 03/21/2020   CHOLHDL 2.2 03/21/2020   No results found for: HGBA1C Lab Results  Component Value Date   VITAMINB12 1,121 09/21/2016   Lab Results  Component Value Date   TSH 0.43 06/08/2019       ASSESSMENT AND PLAN  Meningioma (HCC)  Visual field defect  Chronic left shoulder pain  Cervical radiculopathy   She Will f/u at [facility] for suprasellar meningioma and visual field issues.    Shoulder and neck are doing well --- if worsens can repear subacromial bursa injeciton.    She will f/u as needed   Lavetta Geier A. Felecia Shelling, MD, [facility] 08/26/7946, 01:65 PM Certified in Neurology, Clinical Neurophysiology, Sleep Medicine and Neuroimaging  [facility] [address], [place], [place] [address] [phone]

## 2021-03-27 DIAGNOSIS — E669 Obesity, unspecified: Secondary | ICD-10-CM | POA: Diagnosis not present

## 2021-03-27 DIAGNOSIS — Z6835 Body mass index (BMI) 35.0-35.9, adult: Secondary | ICD-10-CM | POA: Diagnosis not present

## 2021-03-28 DIAGNOSIS — D329 Benign neoplasm of meninges, unspecified: Secondary | ICD-10-CM | POA: Diagnosis not present

## 2021-03-28 DIAGNOSIS — J3489 Other specified disorders of nose and nasal sinuses: Secondary | ICD-10-CM | POA: Diagnosis not present

## 2021-03-29 DIAGNOSIS — D329 Benign neoplasm of meninges, unspecified: Secondary | ICD-10-CM | POA: Diagnosis not present

## 2021-04-05 ENCOUNTER — Ambulatory Visit (INDEPENDENT_AMBULATORY_CARE_PROVIDER_SITE_OTHER): Payer: Medicare Other | Admitting: Family Medicine

## 2021-04-05 ENCOUNTER — Encounter: Payer: Self-pay | Admitting: Family Medicine

## 2021-04-05 ENCOUNTER — Telehealth: Payer: Self-pay

## 2021-04-05 VITALS — BP 126/80 | HR 78 | Temp 97.8°F | Resp 15 | Ht 67.0 in | Wt 203.2 lb

## 2021-04-05 DIAGNOSIS — R739 Hyperglycemia, unspecified: Secondary | ICD-10-CM | POA: Diagnosis not present

## 2021-04-05 DIAGNOSIS — H209 Unspecified iridocyclitis: Secondary | ICD-10-CM

## 2021-04-05 DIAGNOSIS — D329 Benign neoplasm of meninges, unspecified: Secondary | ICD-10-CM | POA: Diagnosis not present

## 2021-04-05 DIAGNOSIS — I739 Peripheral vascular disease, unspecified: Secondary | ICD-10-CM

## 2021-04-05 DIAGNOSIS — I25118 Atherosclerotic heart disease of native coronary artery with other forms of angina pectoris: Secondary | ICD-10-CM | POA: Diagnosis not present

## 2021-04-05 DIAGNOSIS — Z1509 Genetic susceptibility to other malignant neoplasm: Secondary | ICD-10-CM

## 2021-04-05 DIAGNOSIS — H4040X0 Glaucoma secondary to eye inflammation, unspecified eye, stage unspecified: Secondary | ICD-10-CM | POA: Diagnosis not present

## 2021-04-05 DIAGNOSIS — H5340 Unspecified visual field defects: Secondary | ICD-10-CM

## 2021-04-05 DIAGNOSIS — E049 Nontoxic goiter, unspecified: Secondary | ICD-10-CM | POA: Diagnosis not present

## 2021-04-05 LAB — GLUCOSE, POCT (MANUAL RESULT ENTRY): POC Glucose: 86 mg/dl (ref 70–99)

## 2021-04-05 LAB — POCT GLYCOSYLATED HEMOGLOBIN (HGB A1C): Hemoglobin A1C: 5.8 % — AB (ref 4.0–5.6)

## 2021-04-05 NOTE — Telephone Encounter (Signed)
error 

## 2021-04-05 NOTE — Progress Notes (Addendum)
Subjective:  Patient ID: Pamela Lowe, female    DOB: 1950-09-20  Age: 71 y.o. MRN: [id number]  CC:  Chief Complaint  Patient presents with   Establish Care    Pt here to establish care so all her care is within [facility]     HPI Pamela Lowe presents for  New patient to establish primary care.  Previous primary care provider Dr. Harrington Challenger. This office is closer to home.   No acute concerns.   Cardiac: Cardiologist Dr. Daneen Schick. History of hypertension, CAD, PAD, status post coronary artery stent placement.-Drug-eluting stent x2 to mid to distal LAD and D1 in October 2020 with residual CAD in MRCA, small PDA.  ABI's were stable in September without significant change since prior study. Continued on ezetimibe, rosuvastatin, and for blood pressure benazepril 20 mg daily, HCTZ only on Monday, Wednesday and Friday, and metoprolol succinate 25 mg daily. Aspirin 81 mg daily. BP at home 107/70. Tolerating meds without new side effects.  No new CP/dyspnea with activity.   Endocrine:  Plan for follow up with Dr. Chalmers Cater in March, followed for goiter, and with recent meningioma and proximity to pituitary.   Hyperlipidemia: Treated with rosuvastatin, ezetimibe.  Previous elevated LFTs, dosages were adjusted.  Ezetimibe 10 mg daily, rosuvastatin 20 mg daily. Appt with cardiology in March - plan for bloodwork at that time.   Lab Results  Component Value Date   CHOL 175 03/21/2020   HDL 79 03/21/2020   LDLCALC 78 03/21/2020   TRIG 103 03/21/2020   CHOLHDL 2.2 03/21/2020   Lab Results  Component Value Date   ALT 35 (H) 04/26/2020   AST 32 04/26/2020   ALKPHOS 54 04/26/2020   BILITOT 0.7 04/26/2020    Neuro: Meningioma suprasellar, resected 02/24/2021 at [place], Dr.Zomoradi, ENT Ladona Horns.  Thought to be displacing optic nerves.  History of uveitis and glaucoma, visual changes thought to be related to existing visual issues.  WHO grade 1 meningioma.  Followed by Dr. Felecia Shelling with  neurology.  Visual field worse with bitemporal field cuts, plan for neuro-ophthalmologist follow-up based on 03/21/2021 note.  Treated with acetazolamide and other eyedrops.  Previous shoulder neck symptoms were improving after subacromial bursa injection, as needed follow-up with Dr. Felecia Shelling.  Saw ENT earlier this month.  Eye eppt in February.   Ophthalmic: Prior hx of uveitis treated with MTX, simponi aria. Pseudophakia of both eyes, glaucoma. Loss of peripheral vision.  Completed acetazolamide, still on Cosopt, Alphagan.  OpthoRoseanne Kaufman - uveitis specialist. [facility] - Dr. Kathlen Mody, now Dr. Lucianne Lei = primary optho. Moyer - glaucoma.   Rheum: Rheumatoid arthritis vs psoriatic arthritis, Dr. Trudie Reed. Simponi aria - on hold d/t recent surgery. Still on methotrexate.   GYN: Dr. [name].  S/p hysterectomy. Plan for oopherectomy given hx of Lynch syndrome - deferred until after meningioma was removed. Will follow up with GYN.   GI: Dr. Ardis Hughs. Reviewed OV in 11/2020.  Hx of Lynch syndrome, recent colonoscopy - 1 polyp. Yearly testing.  Abdominal pain/spasms periodically. Hyoscamine if needed. Has not needed in past 6 months.    Hyperglycemia: Elevated blood sugars with steroids from brain tumor (146-204). Advised it should improve off steroids - finished dexamethasone about 2 weeks ago.   HM: Immunization History  Administered Date(s) Administered   Influenza, High Dose Seasonal PF 01/08/2020   Influenza-Unspecified 12/13/2017, 12/05/2020   PFIZER Comirnaty(Gray Top)Covid-19 Tri-Sucrose Vaccine 08/01/2020   PFIZER(Purple Top)SARS-COV-2 Vaccination 04/24/2019, 05/19/2019, 12/01/2019   Pfizer Covid-19 Vaccine  Bivalent Booster 34yr & up 01/27/2021   Pneumococcal Conjugate-13 01/08/2020   Tdap 03/19/2006, 11/21/2018   Zoster Recombinat (Shingrix) 12/03/2018, 02/06/2019  Discussed Pneumovax - had in 2022 -she will let me know.   History Patient Active Problem List   Diagnosis Date Noted    [place]eningi[place] ([place] 03/21/2021   Visual field defect 03/21/2021   Chronic left shoulder pain 03/21/2021   Cervical radiculopathy 03/21/2021   Pseudophakia of both eyes 06/29/2019   Steroid responders to glaucoma of both eyes 06/29/2019   S/P coronary artery stent placement    PAD (peripheral artery disease) (HCC)    CAD (coronary artery disease) 12/26/2018   Angina pectoris ([place]    Primary open-angle glaucoma 10/28/2018   Glaucoma associated with ocular inflammation 10/28/2018   Uveitic glaucoma of right eye, severe stage 10/28/2018   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 ([place] 08/21/2018   Aortic atherosclerosis ([place] 08/19/2018   Uveitis 05/05/2018   Goiter 05/05/2018   Mixed hyperlipidemia 05/05/2018   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   Genetic testing 02/07/2018   Family history of kidney cancer 01/15/2018   Family history of pancreatic cancer    Family history of breast cancer    Family history of uterine cancer    Family history of colon cancer    Family history of prostate cancer    Family history of lung cancer    Hypertensive retinopathy of both eyes 11/12/2017   Iritis of right eye 11/12/2017   Nuclear sclerotic cataract of left eye 11/12/2017   Ocular hypertension of right eye 11/12/2017   Pseudophakia, right eye 11/12/2017   Inflammation of eye, right 08/07/2017   Memory loss 09/21/2016   Essential hypertension 09/21/2016   Low vitamin D level 09/21/2016   DOE (dyspnea on exertion) 11/08/2010   Past Medical History:  Diagnosis Date   Allergic rhinitis    Allergy    Arthritis    Brain tumor ([place]    Breast cyst 2007   Right   Cataract    surgery 07/2017   Depression 2006   w/ menopause   Family history of breast cancer    Family history of colon cancer    Family history of kidney cancer 01/15/2018   Family history of lung cancer    Family history of pancreatic cancer    Family history of prostate cancer     Family history of uterine cancer    GERD (gastroesophageal reflux disease)    Glaucoma    Heart murmur    History of hysterectomy, supracervical    Hyperlipidemia    Hypertension    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   MLH1 [id number](Splice site)   Multinodular goiter    PAD (peripheral artery disease) ([place]    LE Art [place]/18: Occluded prox-mid R SFA // ABIs 9/22: R 0.83; L 1.0   Pustular psoriasis    Tobacco user    Past Surgical History:  Procedure Laterality Date   BREAST BIOPSY Bilateral 1985   CARDIAC CATHETERIZATION     CATARACT EXTRACTION Right 07/2017   COLONOSCOPY     CORONARY STENT INTERVENTION N/A 12/26/2018   Procedure: CORONARY STENT INTERVENTION;  Surgeon: SBelva Crome MD;  [facility];  Service: Cardiovascular;  Laterality: N/A;   LEFT HEART CATH AND CORONARY ANGIOGRAPHY N/A 12/17/2018   Procedure: LEFT HEART CATH AND CORONARY ANGIOGRAPHY;  Surgeon: SBelva Crome MD;  [facility];  Service: Cardiovascular;  Laterality: N/A;   SUPRACERVICAL ABDOMINAL HYSTERECTOMY  1986   h/o supracervical hysterectomy   UPPER GASTROINTESTINAL ENDOSCOPY     Allergies  Allergen Reactions   Sulfamethoxazole-Trimethoprim Other (See Comments) and Rash    Chills/achy Flu like symptoms    Wellbutrin [Bupropion Hcl] Rash    rash   Prior to Admission medications   Medication Sig Start Date End Date Taking? Authorizing Provider  aspirin 81 MG EC tablet Take 81 mg by mouth every evening.    Yes [provider]  benazepril (LOTENSIN) 20 MG tablet Take 1 tablet (20 mg total) by mouth daily. 02/24/21  Yes Belva Crome, MD  brimonidine (ALPHAGAN) 0.2 % ophthalmic solution Place 1 drop into both eyes 3 (three) times daily.  11/20/18  Yes [provider]  Calcium Carb-Cholecalciferol (CALCIUM-VITAMIN D) 600-400 MG-UNIT TABS Take 1 tablet by mouth daily.   Yes [provider]  clopidogrel (PLAVIX) 75 MG tablet Take 1 tablet  (75 mg total) by mouth daily. 03/14/21  Yes Belva Crome, MD  dorzolamide-timolol (COSOPT) 22.3-6.8 MG/ML ophthalmic solution Place 1 drop into the right eye 2 (two) times daily. 06/29/19  Yes [provider]  ezetimibe (ZETIA) 10 MG tablet Take 1 tablet (10 mg total) by mouth daily. 02/24/21  Yes Belva Crome, MD  fexofenadine (ALLEGRA) 60 MG tablet Take 60 mg by mouth as needed for allergies or rhinitis.   Yes [provider]  folic acid (FOLVITE) 1 MG tablet Take 1 mg by mouth daily.    Yes [provider]  hydrochlorothiazide (MICROZIDE) 12.5 MG capsule Take 1 capsule (12.5 mg total) by mouth every Monday, Wednesday, and Friday. 03/15/21  Yes Belva Crome, MD  Hyoscyamine Sulfate SL (LEVSIN/SL) 0.125 MG SUBL Take 1 to 2 tablets sublingual (under the tongue) twice daily as needed. 12/06/20  Yes Milus Banister, MD  ipratropium (ATROVENT) 0.06 % nasal spray Place 2 sprays into both nostrils 3 (three) times daily. 12/03/19  Yes [provider]  methotrexate (RHEUMATREX) 2.5 MG tablet Take by mouth once a week. Mondays Take 6 tablets   Yes [provider]  metoprolol succinate (TOPROL-XL) 25 MG 24 hr tablet Take 1 tablet by mouth once daily 03/21/21  Yes Belva Crome, MD  Multiple Vitamins-Minerals (MULTIVITAMIN WITH MINERALS) tablet Take 1 tablet by mouth daily.   Yes [provider]  Omega-3 Fatty Acids (FISH OIL) 1000 MG CAPS Take 1 capsule by mouth daily.    Yes [provider]  pantoprazole (PROTONIX) 40 MG tablet TAKE ONE TABLET BY MOUTH DAILY 12/03/19  Yes Belva Crome, MD  rosuvastatin (CRESTOR) 20 MG tablet TAKE ONE TABLET BY MOUTH DAILY 12/06/20  Yes Belva Crome, MD  [facility] ARIA 50 MG/4ML SOLN injection Inject 50 mg into the vein. Every 60 Days 07/16/19  Yes [provider]  acetaZOLAMIDE (DIAMOX) 250 MG tablet Take 500 mg by mouth 2 (two) times daily. 02/28/21   [provider]  COLLAGEN-VITAMIN C-BIOTIN  PO Take by mouth.    [provider]   Social History   Socioeconomic History   Marital status: Married    Spouse name: Pamela Lowe   Number of children: 0   Years of education: Not on file   Highest education level: Master's degree (e.g., MA, MS, MEng, MEd, MSW, MBA)  Occupational History   Occupation: Special Ed Teacher    Comment: Engineer, manufacturing  Tobacco Use   Smoking status: Former    Packs/day:  1.50    Years: 39.00    Pack years: 58.50    Types: Cigarettes    Quit date: 08/01/2017    Years since quitting: 3.6   Smokeless tobacco: Never   Tobacco comments:    using nicoderm patches  Vaping Use   Vaping Use: Never used  Substance and Sexual Activity   Alcohol use: Not Currently   Drug use: No   Sexual activity: Yes    Partners: Male    Birth control/protection: Surgical    Comment: Hysterectomy  Other Topics Concern   Not on file  Social History Narrative   Not on file   Social Determinants of Health   Financial Resource Strain: Not on file  Food Insecurity: Not on file  Transportation Needs: Not on file  Physical Activity: Not on file  Stress: Not on file  Social Connections: Not on file  Intimate Partner Violence: Not on file   ROS Per HPI.  Objective:   Vitals:   04/05/21 1652  BP: 126/80  Pulse: 78  Resp: 15  Temp: 97.8 F (36.6 C)  TempSrc: Temporal  SpO2: 96%  Weight: 203 lb 3.2 oz (92.2 kg)  Height: 5' 7"  (1.702 m)    Physical Exam Vitals reviewed.  Constitutional:      Appearance: Normal appearance. She is well-developed.  HENT:     Head: Normocephalic and atraumatic.  Eyes:     Conjunctiva/sclera: Conjunctivae normal.     Pupils: Pupils are equal, round, and reactive to light.  Neck:     Vascular: No carotid bruit.  Cardiovascular:     Rate and Rhythm: Normal rate and regular rhythm.     Heart sounds: Normal heart sounds.  Pulmonary:     Effort: Pulmonary effort is normal.     Breath sounds: Normal breath sounds.   Abdominal:     Palpations: Abdomen is soft. There is no pulsatile mass.     Tenderness: There is no abdominal tenderness.  Musculoskeletal:     Right lower leg: No edema.     Left lower leg: No edema.  Skin:    General: Skin is warm and dry.  Neurological:     Mental Status: She is alert and oriented to person, place, and time.  Psychiatric:        Mood and Affect: Mood normal.        Behavior: Behavior normal.   Results for orders placed or performed in visit on 04/05/21  POCT glucose (manual entry)  Result Value Ref Range   POC Glucose 86 70 - 99 mg/dl  POCT glycosylated hemoglobin (Hb A1C)  Result Value Ref Range   Hemoglobin A1C 5.8 (A) 4.0 - 5.6 %   HbA1c POC (<> result, manual entry)     HbA1c, POC (prediabetic range)     HbA1c, POC (controlled diabetic range)     Over 60 minutes spent during visit, including chart review, review of multiple outside records and care plans by multiple specialists above. counseling and assimilation of information, exam, discussion of plan, and chart completion.    Assessment & Plan:  Pamela Lowe is a 71 y.o. female . Meningioma ([place]) Visual field defect  -Status post meningioma resection.  Persistent visual defect, plan for follow-up with specialists.  PAD (peripheral artery disease) (HCC) Coronary artery disease involving native coronary artery of native heart with other form of angina pectoris ([place])  -Denies chest pain, dyspnea with exercise, ongoing follow-up with cardiology including labs planned in the  next few months.  Recent ABI noted stable.  Glaucoma associated with ocular inflammation, unspecified glaucoma stage, unspecified laterality Uveitis  -Ongoing care with ophthalmology, continue same.  Goiter  -Plan to follow-up with endocrinology, Dr. Chalmers Cater, as well as follow-up given proximity of meningioma to pituitary  Hyperglycemia - Plan: POCT glucose (manual entry), POCT glycosylated hemoglobin (Hb A1C)  -Likely  related to steroid use.  In office glucose okay, potentially can repeat A1c in 3 months, doubt diabetes/prediabetes.  Lynch syndrome  -Yearly colonoscopy planned with close follow-up gastroenterology.  Also plan for oophorectomy but she will follow-up with gynecology to discuss.   No orders of the defined types were placed in this encounter.  Patient Instructions  Nice meeting you today.  Keep follow-up as planned with your specialist.  We can follow-up in 3 months to review the lab work from cardiology but I am happy to see you sooner if needed.  Depending on the blood sugar readings today we can likely recheck the levels in 3 months but I expect those to improve off of the steroids.  Take care!    Signed,   Merri Ray, MD [place] 04/05/21 4:08 PM

## 2021-04-05 NOTE — Progress Notes (Signed)
[facility] TeleHEALTH VISIT PROGRESS NOTE   I connected with Pamela Lowe on 04/07/21 at 10:00 AM EST by telephone and verified that I am speaking with the correct person using two identifiers.  I discussed the limitations, risks, security and privacy concerns of performing an evaluation and management service by telemedicine and the availability of in-person appointments. I also discussed with the patient that there may be a patient responsible charge related to this service. The patient expressed understanding and agreed to proceed.  Other persons participating in the visit and their role in the encounter: None  Patients location: Home  Providers location: Office  Wendie Agreste, MD [address] [place] [address] [place]  [address]  DIAGNOSIS: Lynch Syndrome, MHL1  Mutation positive  CURRENT THERAPY: Surveillance   INTERVAL HISTORY: Pamela Lowe 71 y.o. female returns to the clinic today for a follow up visit. She was last seen on 09/23/19. She is here for a follow up of her Lynch Syndrome. In the interval since she was last seen, she was found to have a suprasellar meningioma. This was resected on 02/24/2021 at [facility]. She is followed by Dr. Mervyn Gay and Ladona Horns, ENT. She also sees Dr. Felecia Shelling in neurology. She has been struggling with visual field defects. She sees opthalmology.   Regarding her Lynch Syndrome, she is up to date with her colonoscopy which was performed under the care of Dr. Ardis Hughs on 12/26/2020. A 3 mm polyp was found in the descending colon. She will have a repeat colonoscopy in 1 year with EGD per note. The patient denies dysphagia, GERD, nausea, vomiting, diarrhea, constipation, abdominal pain. Denies fevers, chills, night sweats, or weight loss. For pancreatic cancer screening, she can either have EUS or abdominal MRI. She is going to talk to Dr. Ardis Hughs about EUS at her next follow up which will likely be in the fall of 2023.   She is a  former smoker and previously was enrolled in the lung cancer screening program. Her last scan was on 01/26/20. She is eligable for yearly CT low dose CT scans. She states they actually called her yesterday to schedule this. She will call them back later today. Denies unusual cough, shortness of breath, hemoptysis, or chest pain. She is a former smoker, having quit smoking 4 years ago.  She had a hysterectomy due to h/o fibroids. She was going to proceed with oophorectomy but deferred this to a later date until she can undergo surgery for her meningoma. She saw her OBGYN, Dr. [name], in November. They performed an US of the pelvis which did not show any abnormal findings at that time. They agreed that her meningoma took precedence and they will revisit a possible oophorectomy at a later day.   I cannot see this scheduled, but the patient states she is scheduled for a mammogram on 05/24/2021. he is eligible for yearly breast MRIs for cancer screening. She didn't have a breast MRI ordered in 2022 because she was not sure who would order this. She denies breast lumps, adenopathy, skin changes, or nipple changes.  Last on 12/11/19.    MEDICAL HISTORY: Past Medical History:  Diagnosis Date   Allergic rhinitis    Allergy    Arthritis    Brain tumor [facility])    Breast cyst 2007   Right   Cataract    surgery 07/2017   Depression 2006   w/ menopause   Family history of breast cancer    Family  history of colon cancer    Family history of kidney cancer 01/15/2018   Family history of lung cancer    Family history of pancreatic cancer    Family history of prostate cancer    Family history of uterine cancer    GERD (gastroesophageal reflux disease)    Glaucoma    Heart murmur    History of hysterectomy, supracervical    Hyperlipidemia    Hypertension    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   MLH1 [id number] (Splice site)   Multinodular goiter    PAD (peripheral artery disease) ([place])    LE Art [place]  1/18: Occluded prox-mid R SFA // ABIs 9/22: R 0.83; L 1.0   Pustular psoriasis    Tobacco user     ALLERGIES:  is allergic to sulfamethoxazole-trimethoprim and wellbutrin [bupropion hcl].  MEDICATIONS:  Current Outpatient Medications  Medication Sig Dispense Refill   aspirin 81 MG EC tablet Take 81 mg by mouth every evening.      benazepril (LOTENSIN) 20 MG tablet Take 1 tablet (20 mg total) by mouth daily. 90 tablet 2   brimonidine (ALPHAGAN) 0.2 % ophthalmic solution Place 1 drop into both eyes 3 (three) times daily.      Calcium Carb-Cholecalciferol (CALCIUM-VITAMIN D) 600-400 MG-UNIT TABS Take 1 tablet by mouth daily.     clopidogrel (PLAVIX) 75 MG tablet Take 1 tablet (75 mg total) by mouth daily. 90 tablet 3   dorzolamide-timolol (COSOPT) 22.3-6.8 MG/ML ophthalmic solution Place 1 drop into the right eye 2 (two) times daily.     ezetimibe (ZETIA) 10 MG tablet Take 1 tablet (10 mg total) by mouth daily. 90 tablet 2   fexofenadine (ALLEGRA) 60 MG tablet Take 60 mg by mouth as needed for allergies or rhinitis.     folic acid (FOLVITE) 1 MG tablet Take 1 mg by mouth daily.      hydrochlorothiazide (MICROZIDE) 12.5 MG capsule Take 1 capsule (12.5 mg total) by mouth every Monday, Wednesday, and Friday. 45 capsule 3   Hyoscyamine Sulfate SL (LEVSIN/SL) 0.125 MG SUBL Take 1 to 2 tablets sublingual (under the tongue) twice daily as needed. 50 tablet 0   ipratropium (ATROVENT) 0.06 % nasal spray Place 2 sprays into both nostrils 3 (three) times daily.     methotrexate (RHEUMATREX) 2.5 MG tablet Take by mouth once a week. Mondays Take 6 tablets     metoprolol succinate (TOPROL-XL) 25 MG 24 hr tablet Take 1 tablet by mouth once daily 90 tablet 2   Multiple Vitamins-Minerals (MULTIVITAMIN WITH MINERALS) tablet Take 1 tablet by mouth daily.     Omega-3 Fatty Acids (FISH OIL) 1000 MG CAPS Take 1 capsule by mouth daily.      pantoprazole (PROTONIX) 40 MG tablet TAKE ONE TABLET BY MOUTH DAILY 90  tablet 3   rosuvastatin (CRESTOR) 20 MG tablet TAKE ONE TABLET BY MOUTH DAILY 90 tablet 3   SIMPONI ARIA 50 MG/4ML SOLN injection Inject 50 mg into the vein. Every 60 Days     No current facility-administered medications for this visit.    SURGICAL HISTORY:  Past Surgical History:  Procedure Laterality Date   BREAST BIOPSY Bilateral 1985   CARDIAC CATHETERIZATION     CATARACT EXTRACTION Right 07/2017   COLONOSCOPY     CORONARY STENT INTERVENTION N/A 12/26/2018   Procedure: CORONARY STENT INTERVENTION;  Surgeon: Belva Crome, MD;  Location: [facility];  Service: Cardiovascular;  Laterality: N/A;   LEFT HEART CATH AND CORONARY ANGIOGRAPHY  N/A 12/17/2018   Procedure: LEFT HEART CATH AND CORONARY ANGIOGRAPHY;  Surgeon: Belva Crome, MD;  Location: [facility];  Service: Cardiovascular;  Laterality: N/A;   SUPRACERVICAL ABDOMINAL HYSTERECTOMY  1986   h/o supracervical hysterectomy   UPPER GASTROINTESTINAL ENDOSCOPY      REVIEW OF SYSTEMS:   Review of Systems  Constitutional: Negative for appetite change, chills, fatigue, fever and unexpected weight change.  HENT: Negative for mouth sores, nosebleeds, sore throat and trouble swallowing.   Eyes: Positive for visual field defects. Negative for icterus.  Respiratory: Negative for cough, hemoptysis, shortness of breath and wheezing.   Cardiovascular: Negative for chest pain and leg swelling.  Gastrointestinal: Negative for abdominal pain, constipation, diarrhea, nausea and vomiting.  Genitourinary: Negative for bladder incontinence, difficulty urinating, dysuria, frequency and hematuria.   Musculoskeletal: Negative for back pain, gait problem, neck pain and neck stiffness.  Skin: Negative for itching and rash.  Neurological: Negative for dizziness, extremity weakness, gait problem, headaches, light-headedness and seizures.  Hematological: Negative for adenopathy. Does not bruise/bleed easily.  Psychiatric/Behavioral:  Negative for confusion, depression and sleep disturbance. The patient is not nervous/anxious.     PHYSICAL EXAMINATION:  Last menstrual period 03/19/1998.  ECOG PERFORMANCE STATUS: 0-1  Physical Exam  Constitutional: Oriented to person, place, and time and well-developed, well-nourished, and in no distress. HENT:  Head: Normocephalic and atraumatic.  Psychiatric: Mood, memory and judgment normal.  Vitals reviewed.  LABORATORY DATA: Lab Results  Component Value Date   WBC 7.2 11/02/2019   HGB 13.3 11/02/2019   HCT 42.2 11/02/2019   MCV 94.8 11/02/2019   PLT 303 11/02/2019      Chemistry      Component Value Date/Time   NA 141 11/02/2019 1814   NA 147 (H) 12/10/2018 1157   K 3.9 11/02/2019 1814   CL 105 11/02/2019 1814   CO2 28 11/02/2019 1814   BUN 16 11/02/2019 1814   BUN 19 12/10/2018 1157   CREATININE 1.07 (H) 11/02/2019 1814   CREATININE 1.41 (H) 12/16/2012 1554      Component Value Date/Time   CALCIUM 9.8 11/02/2019 1814   ALKPHOS 54 04/26/2020 1046   AST 32 04/26/2020 1046   ALT 35 (H) 04/26/2020 1046   BILITOT 0.7 04/26/2020 1046       RADIOGRAPHIC STUDIES:  No results found.  ASSESSMENT/PLAN:  Pamela Lowe is a 71 y.o. female with      1. Lynch Syndrome, MLH1 mutation heterozygous  -Her genetic test results from 12/2017 showed MHL1 mutation, heterozygous.  -Dr. Burr Medico previously discussed that this is one of the most common genetic mutations which causes Lynch syndrome and reviewed NCCN Guideline with patient. They previously discussed with MLH1 mutations carriers 40-80% lifetime risk of colon cancer by age of 60, 25-60% lifetime risk of endometrial cancer by age of 60, slightly increased risk of ovarian cancer (4-22%), smaller risk of gastric and pancreatic cancer at about 6%, and other extra chronic malignancy, such as hepatobiliary track, urinary tract, small bowel, brain and skin, in the range of 1-9%. -With Lynch syndrome she would need  colonoscopy every 1-2 years and upper endoscopy every 2-5 years. She has had hysterectomy due to h/o of fibroids. She will proceed with possible oophorectomy with her gynecologist in the future. Her last colonoscopy was in October 2022 which showed one benign polyp. Repeat next in 12/2021 with EGD. She is seen by Dr. Ardis Hughs.  -Given she has strong family history of pancreatic cancer (3  paternal uncles), so she qualify for pancreatic cancer screening with MRI every 2 years or so or upper endoscopy ultrasound for gastric and pancreatic cancer. Her last MRI of the abdomen was 11/2019. I will hold off on ordering MRI abdomen for now since the patient is due to for EGD and colonoscopy in 10/23. She will discuss this with Dr. Ardis Hughs about EUS. Otherwise, we would be happy to order MRI of the abdomen if she does not have this done in October 2023.  -She is clinically stable and has no new concerns. She has not been able to proceed with BSO yet as she has meningioma and s/p recent surgery. Her 04/2019 Mammogram was normal. She is reportedly scheduled for a mammogram on 06/03/21. She is open to additional yearly breast MRIs for cancer screening, I have ordered this for 6 months after her next mammogram.  -Continue screening, F/u in 12 months.      2. Health maintenance: HLD, HTN, Rheumatoid Arthritis  -She will continue to f/u with PCP, cardiologist, Gyn, RA. She will continue medications.  -She was previously followed by the lung cancer screening program in 2021. Coincidentally, the called the patient yesterday to schedule. She will call them back today. She is a former smoker, having quit 4 years ago.      PLAN:  -Breast MRI ordered for cancer screening for 9/20263 -Mammogram scheduled 06/03/21 -Lung cancer screening, she will call to reschedule this.  -Proceed with Colonoscopy and EGD in October 2023. -She will let us know if she would like [place] to order MRI abdomen in 10/23 if she does not get EUS.  -Lab and  F/u in 12 months.    I discussed the assessment and treatment plan with the patient. The patient was provided an opportunity to ask questions and all were answered. The patient agreed with the plan and demonstrated an understanding of the instructions.  The patient was advised to call back or seek an in-person evaluation if the symptoms worsen or if the condition fails to improve as anticipated.  I provided 20-29 minutes of non face-to-face telephone visit time during this encounter, and > 50% was spent counseling as documented under my assessment & plan.  Isma Tietje L Areli Frary, PA-C 04/07/2021 4:18 PM  Orders Placed This Encounter  Procedures   MR BREAST BILATERAL W WO CONTRAST INC CAD    6 months after mammogram. High risk breast cancer patient. Has lynch syndrome    Standing Status:   Future    Standing Expiration Date:   04/07/2022    Scheduling Instructions:     6 months after mammogram. High risk breast cancer patient. Has lynch syndrome    Order Specific Question:   If indicated for the ordered procedure, I authorize the administration of contrast media per Radiology protocol    Answer:   Yes    Order Specific Question:   What is the patient's sedation requirement?    Answer:   No Sedation    Order Specific Question:   Does the patient have a pacemaker or implanted devices?    Answer:   No    Order Specific Question:   Preferred imaging location?    Answer:   [facility] (table limit-550lbs)     Biddie Sebek L Shakeerah Gradel, PA-C 04/07/21

## 2021-04-05 NOTE — Patient Instructions (Signed)
Nice meeting you today.  Keep follow-up as planned with your specialist.  We can follow-up in 3 months to review the lab work from cardiology but I am happy to see you sooner if needed.  Depending on the blood sugar readings today we can likely recheck the levels in 3 months but I expect those to improve off of the steroids.  Take care!

## 2021-04-07 ENCOUNTER — Inpatient Hospital Stay: Payer: Medicare Other | Attending: Hematology | Admitting: Physician Assistant

## 2021-04-07 DIAGNOSIS — Z1509 Genetic susceptibility to other malignant neoplasm: Secondary | ICD-10-CM

## 2021-04-07 DIAGNOSIS — Z1379 Encounter for other screening for genetic and chromosomal anomalies: Secondary | ICD-10-CM | POA: Diagnosis not present

## 2021-04-10 ENCOUNTER — Other Ambulatory Visit: Payer: Self-pay

## 2021-04-10 DIAGNOSIS — Z87891 Personal history of nicotine dependence: Secondary | ICD-10-CM

## 2021-04-11 DIAGNOSIS — D329 Benign neoplasm of meninges, unspecified: Secondary | ICD-10-CM | POA: Diagnosis not present

## 2021-04-11 DIAGNOSIS — H5347 Heteronymous bilateral field defects: Secondary | ICD-10-CM | POA: Diagnosis not present

## 2021-04-11 DIAGNOSIS — H209 Unspecified iridocyclitis: Secondary | ICD-10-CM | POA: Diagnosis not present

## 2021-04-11 DIAGNOSIS — H4041X3 Glaucoma secondary to eye inflammation, right eye, severe stage: Secondary | ICD-10-CM | POA: Diagnosis not present

## 2021-04-17 DIAGNOSIS — Z111 Encounter for screening for respiratory tuberculosis: Secondary | ICD-10-CM | POA: Diagnosis not present

## 2021-04-17 DIAGNOSIS — R5383 Other fatigue: Secondary | ICD-10-CM | POA: Diagnosis not present

## 2021-04-17 DIAGNOSIS — Z79899 Other long term (current) drug therapy: Secondary | ICD-10-CM | POA: Diagnosis not present

## 2021-04-17 DIAGNOSIS — M0589 Other rheumatoid arthritis with rheumatoid factor of multiple sites: Secondary | ICD-10-CM | POA: Diagnosis not present

## 2021-04-18 ENCOUNTER — Other Ambulatory Visit: Payer: Self-pay

## 2021-04-18 ENCOUNTER — Ambulatory Visit (HOSPITAL_BASED_OUTPATIENT_CLINIC_OR_DEPARTMENT_OTHER)
Admission: RE | Admit: 2021-04-18 | Discharge: 2021-04-18 | Disposition: A | Discharge status: Home / Self Care | Payer: Medicare Other | Source: Ambulatory Visit | Attending: Acute Care | Admitting: Acute Care

## 2021-04-18 DIAGNOSIS — Z87891 Personal history of nicotine dependence: Secondary | ICD-10-CM | POA: Diagnosis not present

## 2021-04-19 ENCOUNTER — Other Ambulatory Visit: Payer: Self-pay | Admitting: Gastroenterology

## 2021-04-20 ENCOUNTER — Other Ambulatory Visit: Payer: Self-pay | Admitting: Acute Care

## 2021-04-20 DIAGNOSIS — Z87891 Personal history of nicotine dependence: Secondary | ICD-10-CM

## 2021-04-24 DIAGNOSIS — E669 Obesity, unspecified: Secondary | ICD-10-CM | POA: Diagnosis not present

## 2021-04-24 DIAGNOSIS — Z6835 Body mass index (BMI) 35.0-35.9, adult: Secondary | ICD-10-CM | POA: Diagnosis not present

## 2021-04-26 ENCOUNTER — Other Ambulatory Visit: Payer: Self-pay | Admitting: Endocrinology

## 2021-04-26 DIAGNOSIS — E049 Nontoxic goiter, unspecified: Secondary | ICD-10-CM

## 2021-04-28 ENCOUNTER — Other Ambulatory Visit: Payer: Self-pay

## 2021-04-28 ENCOUNTER — Ambulatory Visit
Admission: RE | Admit: 2021-04-28 | Discharge: 2021-04-28 | Disposition: A | Discharge status: Home / Self Care | Payer: Medicare Other | Source: Ambulatory Visit | Attending: Endocrinology | Admitting: Endocrinology

## 2021-04-28 DIAGNOSIS — E049 Nontoxic goiter, unspecified: Secondary | ICD-10-CM | POA: Diagnosis not present

## 2021-05-10 DIAGNOSIS — M1711 Unilateral primary osteoarthritis, right knee: Secondary | ICD-10-CM | POA: Diagnosis not present

## 2021-05-10 DIAGNOSIS — M7051 Other bursitis of knee, right knee: Secondary | ICD-10-CM | POA: Diagnosis not present

## 2021-05-22 DIAGNOSIS — H4041X3 Glaucoma secondary to eye inflammation, right eye, severe stage: Secondary | ICD-10-CM | POA: Diagnosis not present

## 2021-05-22 DIAGNOSIS — H4042X2 Glaucoma secondary to eye inflammation, left eye, moderate stage: Secondary | ICD-10-CM | POA: Diagnosis not present

## 2021-05-22 DIAGNOSIS — H209 Unspecified iridocyclitis: Secondary | ICD-10-CM | POA: Diagnosis not present

## 2021-05-22 DIAGNOSIS — H53462 Homonymous bilateral field defects, left side: Secondary | ICD-10-CM | POA: Diagnosis not present

## 2021-05-24 DIAGNOSIS — Z1231 Encounter for screening mammogram for malignant neoplasm of breast: Secondary | ICD-10-CM | POA: Diagnosis not present

## 2021-05-24 LAB — HM MAMMOGRAPHY

## 2021-05-28 NOTE — Progress Notes (Signed)
Cardiology Office Note:    Date:  05/29/2021   ID:  Pamela Lowe, DOB [birth year], MRN [id number]  PCP:  Wendie Agreste, MD  Cardiologist:  Sinclair Grooms, MD   Referring MD: Lawerance Cruel, MD   Chief Complaint  Patient presents with   Coronary Artery Disease   Hypertension   Hyperlipidemia    History of Present Illness:    Pamela Lowe is a 71 y.o. female with a hx of CAD with significant occlusive LAD disease per coronary CTA/FFR performed 08/2018, hyperlipidemia, hypertension and PAD.  Stent LAD total occlusion and first diagonal October 2020.   She had meningioma removed at [facility] in November.  No cardiac complications.  Her physical activity has been somewhat curtailed because of recuperation from the brain surgery.  She denies angina, dyspnea, orthopnea, PND, nitroglycerin use, orthopnea, edema, and claudication.  Past Medical History:  Diagnosis Date   Allergic rhinitis    Allergy    Arthritis    Brain tumor ([place])    Breast cyst 2007   Right   Cataract    surgery 07/2017   Depression 2006   w/ menopause   Family history of breast cancer    Family history of colon cancer    Family history of kidney cancer 01/15/2018   Family history of lung cancer    Family history of pancreatic cancer    Family history of prostate cancer    Family history of uterine cancer    GERD (gastroesophageal reflux disease)    Glaucoma    Heart murmur    History of hysterectomy, supracervical    Hyperlipidemia    Hypertension    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   MLH1 [id number] (Splice site)   Multinodular goiter    PAD (peripheral artery disease) ([place])    LE Art [place] 1/18: Occluded prox-mid R SFA // ABIs 9/22: R 0.83; L 1.0   Pustular psoriasis    Tobacco user     Past Surgical History:  Procedure Laterality Date   BREAST BIOPSY Bilateral 1985   CARDIAC CATHETERIZATION     CATARACT EXTRACTION Right 07/2017   COLONOSCOPY      CORONARY STENT INTERVENTION N/A 12/26/2018   Procedure: CORONARY STENT INTERVENTION;  Surgeon: Belva Crome, MD;  Location: [place] [facility];  Service: Cardiovascular;  Laterality: N/A;   LEFT HEART CATH AND CORONARY ANGIOGRAPHY N/A 12/17/2018   Procedure: LEFT HEART CATH AND CORONARY ANGIOGRAPHY;  Surgeon: Belva Crome, MD;  Location: [place] [facility];  Service: Cardiovascular;  Laterality: N/A;   SUPRACERVICAL ABDOMINAL HYSTERECTOMY  1986   h/o supracervical hysterectomy   UPPER GASTROINTESTINAL ENDOSCOPY      Current Medications: Current Meds  Medication Sig   aspirin 81 MG EC tablet Take 81 mg by mouth every evening.    benazepril (LOTENSIN) 20 MG tablet Take 1 tablet (20 mg total) by mouth daily.   brimonidine (ALPHAGAN) 0.2 % ophthalmic solution Place 1 drop into both eyes 3 (three) times daily.    Calcium Carb-Cholecalciferol (CALCIUM-VITAMIN D) 600-400 MG-UNIT TABS Take 1 tablet by mouth daily.   clopidogrel (PLAVIX) 75 MG tablet Take 1 tablet (75 mg total) by mouth daily.   dorzolamide-timolol (COSOPT) 22.3-6.8 MG/ML ophthalmic solution Place 1 drop into the right eye 2 (two) times daily.   ezetimibe (ZETIA) 10 MG tablet Take 1 tablet (10 mg total) by mouth daily.   fexofenadine (ALLEGRA) 60 MG tablet Take 60 mg by  mouth as needed for allergies or rhinitis.   folic acid (FOLVITE) 1 MG tablet Take 1 mg by mouth daily.    hydrochlorothiazide (MICROZIDE) 12.5 MG capsule Take 1 capsule (12.5 mg total) by mouth every Monday, Wednesday, and Friday.   hyoscyamine (LEVSIN SL) 0.125 MG SL tablet DISSOLVE 1 TO 2 TABLETS IN MOUTH UNDER THE TONGUE TWICE DAILY AS NEEDED   ipratropium (ATROVENT) 0.06 % nasal spray Place 2 sprays into both nostrils 3 (three) times daily.   methotrexate (RHEUMATREX) 2.5 MG tablet Take by mouth once a week. Mondays Take 6 tablets   metoprolol succinate (TOPROL-XL) 25 MG 24 hr tablet Take 1 tablet by mouth once daily   Multiple Vitamins-Minerals (MULTIVITAMIN  WITH MINERALS) tablet Take 1 tablet by mouth daily.   Omega-3 Fatty Acids (FISH OIL) 1000 MG CAPS Take 1 capsule by mouth daily.    pantoprazole (PROTONIX) 40 MG tablet TAKE ONE TABLET BY MOUTH DAILY   rosuvastatin (CRESTOR) 20 MG tablet TAKE ONE TABLET BY MOUTH DAILY   SIMPONI ARIA 50 MG/4ML SOLN injection Inject 50 mg into the vein. Every 60 Days     Allergies:   Sulfamethoxazole-trimethoprim, Conjugated estrogens, and Wellbutrin [bupropion hcl]   Social History   Socioeconomic History   Marital status: Married    Spouse name: [name]   Number of children: 0   Years of education: Not on file   Highest education level: Master's degree (e.g., MA, MS, MEng, MEd, MSW, MBA)  Occupational History   Occupation: Special Ed Pharmacist, hospital    Comment: Engineer, manufacturing  Tobacco Use   Smoking status: Former    Packs/day: 1.50    Years: 39.00    Pack years: 58.50    Types: Cigarettes    Quit date: 08/01/2017    Years since quitting: 3.8   Smokeless tobacco: Never   Tobacco comments:    using nicoderm patches  Vaping Use   Vaping Use: Never used  Substance and Sexual Activity   Alcohol use: Not Currently   Drug use: No   Sexual activity: Yes    Partners: Male    Birth control/protection: Surgical    Comment: Hysterectomy  Other Topics Concern   Not on file  Social History Narrative   Not on file   Social Determinants of Health   Financial Resource Strain: Not on file  Food Insecurity: Not on file  Transportation Needs: Not on file  Physical Activity: Not on file  Stress: Not on file  Social Connections: Not on file     Family History: The patient's family history includes Allergies in her brother; Alzheimer's disease in her mother; Brain cancer in her paternal aunt; Breast cancer (age of onset: 25) in her sister; Breast cancer (age of onset: 16) in her sister; Breast cancer (age of onset: 59) in her sister; Breast cancer (age of onset: 36) in her cousin; Breast cancer (age of onset:  59) in her maternal aunt; Colon cancer (age of onset: 31) in her paternal grandfather; Deep vein thrombosis in her mother and sister; Heart attack in her maternal grandfather; Heart attack (age of onset: 26) in her brother; Hypertension in her father and mother; Kidney cancer (age of onset: 96) in her cousin; Kidney cancer (age of onset: 72) in her father; Kidney cancer (age of onset: 50) in her cousin; Lung cancer (age of onset: 66) in her paternal uncle; Lung cancer (age of onset: 18) in her maternal uncle; Osteopenia in her mother; Pancreatic cancer (age of onset:  15) in her paternal uncle; Pancreatic cancer (age of onset: 71) in her paternal aunt; Pancreatic cancer (age of onset: 3) in her paternal aunt; Pneumonia in her father; Prostate cancer (age of onset: 108) in her cousin; Prostate cancer (age of onset: 4) in her maternal uncle; Uterine cancer (age of onset: 73) in her cousin; Uterine cancer (age of onset: 26) in her paternal grandmother. There is no history of Thyroid disease, Rectal cancer, or Stomach cancer.  ROS:   Please see the history of present illness.    Has a right meniscus tear which limits physical activity.  Meningioma resection, trans for normal approach, with success at [facility] November 2022.  All other systems reviewed and are negative.  EKGs/Labs/Other Studies Reviewed:    The following studies were reviewed today: No new or recent imaging data.  Bilateral lower extremity Doppler study September 2022: Summary:  ABI Findings:  +---------+------------------+-----+----------+--------+   Right     Rt Pressure (mmHg) Index Waveform   Comment    +---------+------------------+-----+----------+--------+   Brachial  140                                            +---------+------------------+-----+----------+--------+   PTA       123                0.83  monophasic            +---------+------------------+-----+----------+--------+   PERO      121                 0.81  monophasic            +---------+------------------+-----+----------+--------+   DP        117                0.79  monophasic            +---------+------------------+-----+----------+--------+   Great Toe 84                 0.56  Abnormal              +---------+------------------+-----+----------+--------+   +---------+------------------+-----+----------+-------+   Left      Lt Pressure (mmHg) Index Waveform   Comment   +---------+------------------+-----+----------+-------+   Brachial  149                                           +---------+------------------+-----+----------+-------+   PTA       149                1.00  triphasic            +---------+------------------+-----+----------+-------+   PERO      115                0.77  monophasic           +---------+------------------+-----+----------+-------+   DP        145                0.97  monophasic           +---------+------------------+-----+----------+-------+   Great Toe 72                 0.48  Abnormal             +---------+------------------+-----+----------+-------+   +-------+-----------+-----------+------------+------------+  ABI/TBI Today's ABI Today's TBI Previous ABI Previous TBI   +-------+-----------+-----------+------------+------------+   Right   0.83        0.56        0.86                        +-------+-----------+-----------+------------+------------+   Left    100         0.48        1.05                        +-------+-----------+-----------+------------+------------+       Bilateral ABIs appear essentially unchanged compared to prior study on  12/02/15.     Summary:  Right: Resting right ankle-brachial index indicates mild right lower  extremity arterial disease. The right toe-brachial index is abnormal.   Left: Resting left ankle-brachial index is within normal range. No  evidence of significant left lower extremity arterial disease. The left  toe-brachial index is  abnormal.   EKG:  EKG when last performed in September 2022, sinus bradycardia, left axis deviation consistent with left anterior hemiblock, poor R wave progression pseudoinfarction pattern related to left anterior hemiblock, without acute ST-T wave change.  Recent Labs: No results found for requested labs within last 8760 hours.  Recent Lipid Panel    Component Value Date/Time   CHOL 175 03/21/2020 0929   TRIG 103 03/21/2020 0929   HDL 79 03/21/2020 0929   CHOLHDL 2.2 03/21/2020 0929   LDLCALC 78 03/21/2020 0929    Physical Exam:    VS:  BP 112/82    Pulse (!) 46    Ht _0  (1.702 m)    Wt 214 lb 12.8 oz (97.4 kg)    LMP 03/19/1998 Comment: h/o supracervical hysterectomy   SpO2 100%    BMI 33.64 kg/m     Wt Readings from Last 3 Encounters:  05/29/21 214 lb 12.8 oz (97.4 kg)  04/18/21 210 lb (95.3 kg)  04/05/21 203 lb 3.2 oz (92.2 kg)     GEN: Weight gain.. No acute distress HEENT: Normal NECK: No JVD. LYMPHATICS: No lymphadenopathy CARDIAC: No murmur. RRR no gallop, or edema. VASCULAR:  Normal Pulses. No bruits. RESPIRATORY:  Clear to auscultation without rales, wheezing or rhonchi  ABDOMEN: Soft, non-tender, non-distended, No pulsatile mass, MUSCULOSKELETAL: No deformity  SKIN: Warm and dry NEUROLOGIC:  Alert and oriented x 3 PSYCHIATRIC:  Normal affect   ASSESSMENT:    1. Coronary artery disease involving native coronary artery of native heart with other form of angina pectoris ([place])   2. Essential hypertension   3. Mixed hyperlipidemia   4. PVD (peripheral vascular disease) with claudication ([employer])   5. Aortic atherosclerosis (H[facility])    PLAN:    In order of problems listed above:  Secondary prevention discussed.  Exercise encouraged.  Weight loss encouraged. Blood pressure control is important.  Low-salt diet and weight control discussed. Continue statin therapy with target LDL less than 70.  And will be obtained today. Encouraged aerobic activity such as  walking to help build collateral supply. Aggressive risk factor modification.  Overall education and awareness concerning secondary risk prevention was discussed in detail: LDL less than 70, hemoglobin A1c less than 7, blood pressure target less than 130/80 mmHg, >150 minutes of moderate aerobic activity per week, avoidance of smoking, weight control (via diet and exercise), and continued surveillance/management of/for obstructive sleep apnea.   Medication Adjustments/Labs and Tests  Ordered: Current medicines are reviewed at length with the patient today.  Concerns regarding medicines are outlined above.  Orders Placed This Encounter  Procedures   Lipid panel   Hepatic function panel   No orders of the defined types were placed in this encounter.   Patient Instructions  Medication Instructions:  Your physician recommends that you continue on your current medications as directed. Please refer to the Current Medication list given to you today.  *If you need a refill on your cardiac medications before your next appointment, please call your pharmacy*   Lab Work: Lipid and Liver today  If you have labs (blood work) drawn today and your tests are completely normal, you will receive your results only by: [place] (if you have MyChart) OR A paper copy in the mail If you have any lab test that is abnormal or we need to change your treatment, we will call you to review the results.   Testing/Procedures: None   Follow-Up: At [facility], you and your health needs are our priority.  As part of our continuing mission to provide you with exceptional heart care, we have created designated Provider Care Teams.  These Care Teams include your primary Cardiologist (physician) and Advanced Practice Providers (APPs -  Physician Assistants and Nurse Practitioners) who all work together to provide you with the care you need, when you need it.  We recommend signing up for the patient portal  called "MyChart".  Sign up information is provided on this After Visit Summary.  MyChart is used to connect with patients for Virtual Visits (Telemedicine).  Patients are able to view lab/test results, encounter notes, upcoming appointments, etc.  Non-urgent messages can be sent to your provider as well.   To learn more about what you can do with MyChart, go to [email or web address].    Your next appointment:   1 year(s)  The format for your next appointment:   In Person  Provider:   Sinclair Grooms, MD     Other Instructions     Signed, Sinclair Grooms, MD  05/29/2021 11:16 AM    [place]

## 2021-05-29 ENCOUNTER — Other Ambulatory Visit: Payer: Self-pay

## 2021-05-29 ENCOUNTER — Encounter: Payer: Self-pay | Admitting: Interventional Cardiology

## 2021-05-29 ENCOUNTER — Ambulatory Visit (INDEPENDENT_AMBULATORY_CARE_PROVIDER_SITE_OTHER): Payer: Medicare Other | Admitting: Interventional Cardiology

## 2021-05-29 VITALS — BP 112/82 | HR 46 | Ht 67.0 in | Wt 214.8 lb

## 2021-05-29 DIAGNOSIS — E782 Mixed hyperlipidemia: Secondary | ICD-10-CM | POA: Diagnosis not present

## 2021-05-29 DIAGNOSIS — I739 Peripheral vascular disease, unspecified: Secondary | ICD-10-CM | POA: Diagnosis not present

## 2021-05-29 DIAGNOSIS — I10 Essential (primary) hypertension: Secondary | ICD-10-CM | POA: Diagnosis not present

## 2021-05-29 DIAGNOSIS — I700 Atherosclerosis of aorta: Secondary | ICD-10-CM

## 2021-05-29 DIAGNOSIS — E210 Primary hyperparathyroidism: Secondary | ICD-10-CM | POA: Diagnosis not present

## 2021-05-29 DIAGNOSIS — I25118 Atherosclerotic heart disease of native coronary artery with other forms of angina pectoris: Secondary | ICD-10-CM

## 2021-05-29 DIAGNOSIS — E049 Nontoxic goiter, unspecified: Secondary | ICD-10-CM | POA: Diagnosis not present

## 2021-05-29 DIAGNOSIS — D352 Benign neoplasm of pituitary gland: Secondary | ICD-10-CM | POA: Diagnosis not present

## 2021-05-29 LAB — LIPID PANEL
Chol/HDL Ratio: 1.9 ratio (ref 0.0–4.4)
Cholesterol, Total: 153 mg/dL (ref 100–199)
HDL: 82 mg/dL (ref >39)
LDL Chol Calc (NIH): 61 mg/dL (ref 0–99)
Triglycerides: 45 mg/dL (ref 0–149)
VLDL Cholesterol Cal: 10 mg/dL (ref 5–40)

## 2021-05-29 LAB — HEPATIC FUNCTION PANEL
ALT: 50 IU/L — ABNORMAL HIGH (ref 0–32)
AST: 39 IU/L (ref 0–40)
Albumin: 3.9 g/dL (ref 3.8–4.8)
Alkaline Phosphatase: 64 IU/L (ref 44–121)
Bilirubin Total: 0.7 mg/dL (ref 0.0–1.2)
Bilirubin, Direct: 0.21 mg/dL (ref 0.00–0.40)
Total Protein: 6.3 g/dL (ref 6.0–8.5)

## 2021-05-29 NOTE — Patient Instructions (Signed)
Medication Instructions:  ?Your physician recommends that you continue on your current medications as directed. Please refer to the Current Medication list given to you today. ? ?*If you need a refill on your cardiac medications before your next appointment, please call your pharmacy* ? ? ?Lab Work: ?Lipid and Liver today ? ?If you have labs (blood work) drawn today and your tests are completely normal, you will receive your results only by: ?MyChart Message (if you have MyChart) OR ?A paper copy in the mail ?If you have any lab test that is abnormal or we need to change your treatment, we will call you to review the results. ? ? ?Testing/Procedures: ?None ? ? ?Follow-Up: ?At [facility], you and your health needs are our priority.  As part of our continuing mission to provide you with exceptional heart care, we have created designated Provider Care Teams.  These Care Teams include your primary Cardiologist (physician) and Advanced Practice Providers (APPs -  Physician Assistants and Nurse Practitioners) who all work together to provide you with the care you need, when you need it. ? ?We recommend signing up for the patient portal called "MyChart".  Sign up information is provided on this After Visit Summary.  MyChart is used to connect with patients for Virtual Visits (Telemedicine).  Patients are able to view lab/test results, encounter notes, upcoming appointments, etc.  Non-urgent messages can be sent to your provider as well.   ?To learn more about what you can do with MyChart, go to [email or web address].   ? ?Your next appointment:   ?1 year(s) ? ?The format for your next appointment:   ?In Person ? ?Provider:   ?Sinclair Grooms, MD   ? ? ?Other Instructions ?  ?

## 2021-05-30 DIAGNOSIS — H5347 Heteronymous bilateral field defects: Secondary | ICD-10-CM | POA: Diagnosis not present

## 2021-05-30 DIAGNOSIS — H4042X1 Glaucoma secondary to eye inflammation, left eye, mild stage: Secondary | ICD-10-CM | POA: Diagnosis not present

## 2021-05-30 DIAGNOSIS — H4041X3 Glaucoma secondary to eye inflammation, right eye, severe stage: Secondary | ICD-10-CM | POA: Diagnosis not present

## 2021-05-30 DIAGNOSIS — H209 Unspecified iridocyclitis: Secondary | ICD-10-CM | POA: Diagnosis not present

## 2021-06-02 DIAGNOSIS — D329 Benign neoplasm of meninges, unspecified: Secondary | ICD-10-CM | POA: Diagnosis not present

## 2021-06-02 DIAGNOSIS — H539 Unspecified visual disturbance: Secondary | ICD-10-CM | POA: Diagnosis not present

## 2021-06-02 DIAGNOSIS — D320 Benign neoplasm of cerebral meninges: Secondary | ICD-10-CM | POA: Diagnosis not present

## 2021-06-05 DIAGNOSIS — I10 Essential (primary) hypertension: Secondary | ICD-10-CM | POA: Diagnosis not present

## 2021-06-05 DIAGNOSIS — E049 Nontoxic goiter, unspecified: Secondary | ICD-10-CM | POA: Diagnosis not present

## 2021-06-05 DIAGNOSIS — E230 Hypopituitarism: Secondary | ICD-10-CM | POA: Diagnosis not present

## 2021-06-05 DIAGNOSIS — E210 Primary hyperparathyroidism: Secondary | ICD-10-CM | POA: Diagnosis not present

## 2021-06-05 DIAGNOSIS — E0590 Thyrotoxicosis, unspecified without thyrotoxic crisis or storm: Secondary | ICD-10-CM | POA: Diagnosis not present

## 2021-06-06 DIAGNOSIS — E230 Hypopituitarism: Secondary | ICD-10-CM | POA: Diagnosis not present

## 2021-06-08 DIAGNOSIS — E669 Obesity, unspecified: Secondary | ICD-10-CM | POA: Diagnosis not present

## 2021-06-12 DIAGNOSIS — M0589 Other rheumatoid arthritis with rheumatoid factor of multiple sites: Secondary | ICD-10-CM | POA: Diagnosis not present

## 2021-06-12 DIAGNOSIS — L403 Pustulosis palmaris et plantaris: Secondary | ICD-10-CM | POA: Diagnosis not present

## 2021-06-12 DIAGNOSIS — Z79899 Other long term (current) drug therapy: Secondary | ICD-10-CM | POA: Diagnosis not present

## 2021-06-12 DIAGNOSIS — H209 Unspecified iridocyclitis: Secondary | ICD-10-CM | POA: Diagnosis not present

## 2021-06-12 DIAGNOSIS — H539 Unspecified visual disturbance: Secondary | ICD-10-CM | POA: Diagnosis not present

## 2021-06-12 DIAGNOSIS — E2740 Unspecified adrenocortical insufficiency: Secondary | ICD-10-CM | POA: Diagnosis not present

## 2021-06-13 DIAGNOSIS — D329 Benign neoplasm of meninges, unspecified: Secondary | ICD-10-CM | POA: Diagnosis not present

## 2021-06-13 DIAGNOSIS — J3489 Other specified disorders of nose and nasal sinuses: Secondary | ICD-10-CM | POA: Diagnosis not present

## 2021-06-14 DIAGNOSIS — M0589 Other rheumatoid arthritis with rheumatoid factor of multiple sites: Secondary | ICD-10-CM | POA: Diagnosis not present

## 2021-06-15 DIAGNOSIS — B351 Tinea unguium: Secondary | ICD-10-CM | POA: Diagnosis not present

## 2021-06-15 DIAGNOSIS — L603 Nail dystrophy: Secondary | ICD-10-CM | POA: Diagnosis not present

## 2021-06-19 DIAGNOSIS — M549 Dorsalgia, unspecified: Secondary | ICD-10-CM | POA: Diagnosis not present

## 2021-06-19 DIAGNOSIS — L409 Psoriasis, unspecified: Secondary | ICD-10-CM | POA: Diagnosis not present

## 2021-06-19 DIAGNOSIS — Z79899 Other long term (current) drug therapy: Secondary | ICD-10-CM | POA: Diagnosis not present

## 2021-06-19 DIAGNOSIS — M47819 Spondylosis without myelopathy or radiculopathy, site unspecified: Secondary | ICD-10-CM | POA: Diagnosis not present

## 2021-06-22 DIAGNOSIS — M1711 Unilateral primary osteoarthritis, right knee: Secondary | ICD-10-CM | POA: Diagnosis not present

## 2021-06-29 ENCOUNTER — Telehealth: Payer: Self-pay | Admitting: Neurology

## 2021-07-05 ENCOUNTER — Ambulatory Visit: Payer: Medicare Other | Admitting: Family Medicine

## 2021-07-06 DIAGNOSIS — E230 Hypopituitarism: Secondary | ICD-10-CM | POA: Diagnosis not present

## 2021-07-10 NOTE — Telephone Encounter (Signed)
Error

## 2021-07-12 DIAGNOSIS — H401133 Primary open-angle glaucoma, bilateral, severe stage: Secondary | ICD-10-CM | POA: Diagnosis not present

## 2021-07-12 DIAGNOSIS — H40043 Steroid responder, bilateral: Secondary | ICD-10-CM | POA: Diagnosis not present

## 2021-07-24 ENCOUNTER — Encounter (HOSPITAL_BASED_OUTPATIENT_CLINIC_OR_DEPARTMENT_OTHER): Payer: Self-pay | Admitting: Obstetrics & Gynecology

## 2021-07-26 ENCOUNTER — Encounter: Payer: Self-pay | Admitting: Gastroenterology

## 2021-07-26 ENCOUNTER — Other Ambulatory Visit (INDEPENDENT_AMBULATORY_CARE_PROVIDER_SITE_OTHER): Payer: Medicare Other

## 2021-07-26 ENCOUNTER — Telehealth (INDEPENDENT_AMBULATORY_CARE_PROVIDER_SITE_OTHER): Payer: Medicare Other | Admitting: Family Medicine

## 2021-07-26 ENCOUNTER — Encounter: Payer: Self-pay | Admitting: Family Medicine

## 2021-07-26 ENCOUNTER — Ambulatory Visit (INDEPENDENT_AMBULATORY_CARE_PROVIDER_SITE_OTHER): Payer: Medicare Other | Admitting: Gastroenterology

## 2021-07-26 VITALS — BP 122/60 | HR 53 | Ht 66.0 in | Wt 221.5 lb

## 2021-07-26 DIAGNOSIS — R7303 Prediabetes: Secondary | ICD-10-CM | POA: Diagnosis not present

## 2021-07-26 DIAGNOSIS — K219 Gastro-esophageal reflux disease without esophagitis: Secondary | ICD-10-CM | POA: Diagnosis not present

## 2021-07-26 DIAGNOSIS — I25118 Atherosclerotic heart disease of native coronary artery with other forms of angina pectoris: Secondary | ICD-10-CM | POA: Diagnosis not present

## 2021-07-26 LAB — BASIC METABOLIC PANEL
BUN: 21 mg/dL (ref 6–23)
CO2: 30 mEq/L (ref 19–32)
Calcium: 9.8 mg/dL (ref 8.4–10.5)
Chloride: 103 mEq/L (ref 96–112)
Creatinine, Ser: 1.04 mg/dL (ref 0.40–1.20)
GFR: 54.61 mL/min — ABNORMAL LOW (ref >60.00)
Glucose, Bld: 107 mg/dL — ABNORMAL HIGH (ref 70–99)
Potassium: 3.7 mEq/L (ref 3.5–5.1)
Sodium: 139 mEq/L (ref 135–145)

## 2021-07-26 LAB — HEMOGLOBIN A1C: Hgb A1c MFr Bld: 5.7 % (ref 4.6–6.5)

## 2021-07-26 MED ORDER — PANTOPRAZOLE SODIUM 40 MG PO TBEC: 40.0000 mg | DELAYED_RELEASE_TABLET | Freq: Every day | ORAL | 3 refills | Status: DC

## 2021-07-26 NOTE — Progress Notes (Signed)
Virtual Visit via Video Note ? ?I connected with Pamela Lowe on 07/26/21 at 8:41 AM by a video enabled telemedicine application and verified that I am speaking with the correct person using two identifiers.  ?Patient location:home.  ?My location: office - [place].  ?  ?I discussed the limitations, risks, security and privacy concerns of performing an evaluation and management service by telephone and the availability of in person appointments. I also discussed with the patient that there may be a patient responsible charge related to this service. The patient expressed understanding and agreed to proceed, consent obtained ? ?Chief complaint: ? ?Hyperglycemia ? ?Chief Complaint  ?Patient presents with  ? Prediabetes  ?  Pt had been taking steroids post surgery and caused spike in BG /A1c pt notes she had lab work done at Dr Davonna Belling office and they were to send lab work, unsure if these have been received but will attempt to locate or will send fax request  ?No concerns from pt   ? ? ?History of Present Illness: ?Pamela Lowe is a 71 y.o. female ? ?Prediabetes: ?History prediabetes, recently has been taking steroids postsurgery and has noticed some elevations in her blood sugars.  No current meds for diabetes/prediabetes.  Followed by rheumatology, Dr. Dossie Der. Also recent diagnosis of adrenal insufficiency. S/p meningioma removal. Now on hydrocortisone - followed by endocrinology. Slightly elevated blood sugars. Unknown readings.  ?No increased urination, thirst, or blurry vision.  ? ?Lab Results  ?Component Value Date  ? HGBA1C 5.8 (A) 04/05/2021  ? ?Wt Readings from Last 3 Encounters:  ?05/29/21 214 lb 12.8 oz (97.4 kg)  ?04/18/21 210 lb (95.3 kg)  ?04/05/21 203 lb 3.2 oz (92.2 kg)  ? ? ? ?Patient Active Problem List  ? Diagnosis Date Noted  ? Meningioma ([place]) 03/21/2021  ? Visual field defect 03/21/2021  ? Chronic left shoulder pain 03/21/2021  ? Cervical radiculopathy 03/21/2021  ? Pseudophakia  of both eyes 06/29/2019  ? Steroid responders to glaucoma of both eyes 06/29/2019  ? S/P coronary artery stent placement   ? PAD (peripheral artery disease) ([place])   ? CAD (coronary artery disease) 12/26/2018  ? Angina pectoris ([place])   ? Primary open-angle glaucoma 10/28/2018  ? Glaucoma associated with ocular inflammation 10/28/2018  ? Uveitic glaucoma of right eye, severe stage 10/28/2018  ? 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 ([place]) 08/21/2018  ? Aortic atherosclerosis ([place]) 08/19/2018  ? Uveitis 05/05/2018  ? Goiter 05/05/2018  ? Mixed hyperlipidemia 05/05/2018  ? 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  ? Genetic testing 02/07/2018  ? Family history of kidney cancer 01/15/2018  ? Family history of pancreatic cancer   ? Family history of breast cancer   ? Family history of uterine cancer   ? Family history of colon cancer   ? Family history of prostate cancer   ? Family history of lung cancer   ? Hypertensive retinopathy of both eyes 11/12/2017  ? Iritis of right eye 11/12/2017  ? Nuclear sclerotic cataract of left eye 11/12/2017  ? Ocular hypertension of right eye 11/12/2017  ? Pseudophakia, right eye 11/12/2017  ? Inflammation of eye, right 08/07/2017  ? Memory loss 09/21/2016  ? Essential hypertension 09/21/2016  ? Low vitamin D level 09/21/2016  ? DOE (dyspnea on exertion) 11/08/2010  ? ?Past Medical History:  ?Diagnosis Date  ? Allergic rhinitis   ? Allergy   ? Arthritis   ? Brain tumor ([place])   ?  Breast cyst 2007  ? Right  ? Cataract   ? surgery 07/2017  ? Depression 2006  ? w/ menopause  ? Family history of breast cancer   ? Family history of colon cancer   ? Family history of kidney cancer 01/15/2018  ? Family history of lung cancer   ? Family history of pancreatic cancer   ? Family history of prostate cancer   ? Family history of uterine cancer   ? GERD (gastroesophageal reflux disease)   ? Glaucoma   ? Heart murmur   ? History of hysterectomy, supracervical   ?  Hyperlipidemia   ? Hypertension   ? 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  ? MLH1 [id number] (Splice site)  ? Multinodular goiter   ? PAD (peripheral artery disease) ([place])   ? LE Art [place] 1/18: Occluded prox-mid R SFA // ABIs 9/22: R 0.83; L 1.0  ? Pustular psoriasis   ? Tobacco user   ? ?Past Surgical History:  ?Procedure Laterality Date  ? BREAST BIOPSY Bilateral 1985  ? CARDIAC CATHETERIZATION    ? CATARACT EXTRACTION Right 07/2017  ? COLONOSCOPY    ? CORONARY STENT INTERVENTION N/A 12/26/2018  ? Procedure: CORONARY STENT INTERVENTION;  Surgeon: Belva Crome, MD;  Location: [place] [facility];  Service: Cardiovascular;  Laterality: N/A;  ? LEFT HEART CATH AND CORONARY ANGIOGRAPHY N/A 12/17/2018  ? Procedure: LEFT HEART CATH AND CORONARY ANGIOGRAPHY;  Surgeon: Belva Crome, MD;  Location: [place] [facility];  Service: Cardiovascular;  Laterality: N/A;  ? SUPRACERVICAL ABDOMINAL HYSTERECTOMY  1986  ? h/o supracervical hysterectomy  ? UPPER GASTROINTESTINAL ENDOSCOPY    ? ?Allergies  ?Allergen Reactions  ? Sulfamethoxazole-Trimethoprim Other (See Comments) and Rash  ?  Chills/achy ?Flu like symptoms   ? Conjugated Estrogens   ?  Other reaction(s): rash  ? Wellbutrin [Bupropion Hcl] Rash  ?  rash  ? ?Prior to Admission medications   ?Medication Sig Start Date End Date Taking? Authorizing Provider  ?aspirin 81 MG EC tablet Take 81 mg by mouth every evening.    Yes [provider]  ?benazepril (LOTENSIN) 20 MG tablet Take 1 tablet (20 mg total) by mouth daily. 02/24/21  Yes Belva Crome, MD  ?brimonidine (ALPHAGAN) 0.2 % ophthalmic solution Place 1 drop into both eyes 3 (three) times daily.  11/20/18  Yes [provider]  ?Calcium Carb-Cholecalciferol (CALCIUM-VITAMIN D) 600-400 MG-UNIT TABS Take 1 tablet by mouth daily.   Yes [provider]  ?clopidogrel (PLAVIX) 75 MG tablet Take 1 tablet (75 mg total) by mouth daily. 03/14/21  Yes Belva Crome, MD  ?dorzolamide-timolol (COSOPT) 22.3-6.8  MG/ML ophthalmic solution Place 1 drop into the right eye 2 (two) times daily. 06/29/19  Yes [provider]  ?ezetimibe (ZETIA) 10 MG tablet Take 1 tablet (10 mg total) by mouth daily. 02/24/21  Yes Belva Crome, MD  ?fexofenadine (ALLEGRA) 60 MG tablet Take 60 mg by mouth as needed for allergies or rhinitis.   Yes [provider]  ?folic acid (FOLVITE) 1 MG tablet Take 1 mg by mouth daily.    Yes [provider]  ?hydrochlorothiazide (MICROZIDE) 12.5 MG capsule Take 1 capsule (12.5 mg total) by mouth every Monday, Wednesday, and Friday. 03/15/21  Yes Belva Crome, MD  ?hyoscyamine (LEVSIN SL) 0.125 MG SL tablet DISSOLVE 1 TO 2 TABLETS IN MOUTH UNDER THE TONGUE TWICE DAILY AS NEEDED 04/19/21  Yes Milus Banister, MD  ?ipratropium (ATROVENT) 0.06 % nasal spray  Place 2 sprays into both nostrils 3 (three) times daily. 12/03/19  Yes [provider]  ?methotrexate (RHEUMATREX) 2.5 MG tablet Take by mouth once a week. Mondays Take 6 tablets   Yes [provider]  ?metoprolol succinate (TOPROL-XL) 25 MG 24 hr tablet Take 1 tablet by mouth once daily 03/21/21  Yes Belva Crome, MD  ?Multiple Vitamins-Minerals (MULTIVITAMIN WITH MINERALS) tablet Take 1 tablet by mouth daily.   Yes [provider]  ?Omega-3 Fatty Acids (FISH OIL) 1000 MG CAPS Take 1 capsule by mouth daily.    Yes [provider]  ?pantoprazole (PROTONIX) 40 MG tablet TAKE ONE TABLET BY MOUTH DAILY 12/03/19  Yes Belva Crome, MD  ?rosuvastatin (CRESTOR) 20 MG tablet TAKE ONE TABLET BY MOUTH DAILY 12/06/20  Yes Belva Crome, MD  ?[facility] ARIA 50 MG/4ML SOLN injection Inject 50 mg into the vein. Every 60 Days 07/16/19  Yes [provider]  ? ?Social History  ? ?Socioeconomic History  ? Marital status: Married  ?  Spouse name: [name]  ? Number of children: 0  ? Years of education: Not on file  ? Highest education level: Master's degree (e.g., MA, MS, MEng, MEd, MSW, MBA)  ?Occupational  History  ? Occupation: Special Ed Pharmacist, hospital  ?  Comment: Engineer, manufacturing  ?Tobacco Use  ? Smoking status: Former  ?  Packs/day: 1.50  ?  Years: 39.00  ?  Pack years: 58.50  ?  Types: Cigarettes  ?  Quit date: 5/

## 2021-07-26 NOTE — Patient Instructions (Addendum)
Labs at [facility] at your convenience: ?[facility] ?Walk in 8:30-4:30 during weekdays, no appointment needed ?[place]  ?[place],  [id number] ? ?No change in meds for now. Depending on lab results, can discuss changes or monitor.  ? ?Prediabetes ?Prediabetes is when your blood sugar (blood glucose) level is higher than normal but not high enough for you to be diagnosed with type 2 diabetes. Having prediabetes puts you at risk for developing type 2 diabetes (type 2 diabetes mellitus). ?With certain lifestyle changes, you may be able to prevent or delay the onset of type 2 diabetes. This is important because type 2 diabetes can lead to serious complications, such as: ?Heart disease. ?Stroke. ?Blindness. ?Kidney disease. ?Depression. ?Poor circulation in the feet and legs. In severe cases, this could lead to surgical removal of a leg (amputation). ?What are the causes? ?The exact cause of prediabetes is not known. It may result from insulin resistance. Insulin resistance develops when cells in the body do not respond properly to insulin that the body makes. This can cause excess glucose to build up in the blood. High blood glucose (hyperglycemia) can develop. ?What increases the risk? ?The following factors may make you more likely to develop this condition: ?You have a family member with type 2 diabetes. ?You are older than 45 years. ?You had a temporary form of diabetes during a pregnancy (gestational diabetes). ?You had polycystic ovary syndrome (PCOS). ?You are overweight or obese. ?You are inactive (sedentary). ?You have a history of heart disease, including problems with cholesterol levels, high levels of blood fats, or high blood pressure. ?What are the signs or symptoms? ?You may have no symptoms. If you do have symptoms, they may include: ?Increased hunger. ?Increased thirst. ?Increased urination. ?Vision changes, such as blurry vision. ?Tiredness (fatigue). ?How is this diagnosed? ?This  condition can be diagnosed with blood tests. Your blood glucose may be checked with one or more of the following tests: ?A fasting blood glucose (FBG) test. You will not be allowed to eat (you will fast) for at least 8 hours before a blood sample is taken. ?An A1C blood test (hemoglobin A1C). This test provides information about blood glucose levels over the previous 2?3 months. ?An oral glucose tolerance test (OGTT). This test measures your blood glucose at two points in time: ?After fasting. This is your baseline level. ?Two hours after you drink a beverage that contains glucose. ?You may be diagnosed with prediabetes if: ?Your FBG is 100?125 mg/dL (5.6-6.9 mmol/L). ?Your A1C level is 5.7?6.4% (39-46 mmol/mol). ?Your OGTT result is 140?199 mg/dL (7.8-11 mmol/L). ?These blood tests may be repeated to confirm your diagnosis. ?How is this treated? ?Treatment may include dietary and lifestyle changes to help lower your blood glucose and prevent type 2 diabetes from developing. In some cases, medicine may be prescribed to help lower the risk of type 2 diabetes. ?Follow these instructions at home: ?Nutrition ? ?Follow a healthy meal plan. This includes eating lean proteins, whole grains, legumes, fresh fruits and vegetables, low-fat dairy products, and healthy fats. ?Follow instructions from your health care provider about eating or drinking restrictions. ?Meet with a dietitian to create a healthy eating plan that is right for you. ?Lifestyle ?Do moderate-intensity exercise for at least 30 minutes a day on 5 or more days each week, or as told by your health care provider. A mix of activities may be best, such as: ?Brisk walking, swimming, biking, and weight lifting. ?Lose weight as told by  your health care provider. Losing 5-7% of your body weight can reverse insulin resistance. ?Do not drink alcohol if: ?Your health care provider tells you not to drink. ?You are pregnant, may be pregnant, or are planning to become  pregnant. ?If you drink alcohol: ?Limit how much you use to: ?0-1 drink a day for women. ?0-2 drinks a day for men. ?Be aware of how much alcohol is in your drink. In the [place] one drink equals one 12 oz bottle of beer (355 mL), one 5 oz glass of wine (148 mL), or one 1? oz glass of hard liquor (44 mL). ?General instructions ?Take over-the-counter and prescription medicines only as told by your health care provider. You may be prescribed medicines that help lower the risk of type 2 diabetes. ?Do not use any products that contain nicotine or tobacco, such as cigarettes, e-cigarettes, and chewing tobacco. If you need help quitting, ask your health care provider. ?Keep all follow-up visits. This is important. ?Where to find more information ?American Diabetes Association: www.diabetes.org ?Academy of Nutrition and Dietetics: www.eatright.org ?American Heart Association: www.heart.org ?Contact a health care provider if: ?You have any of these symptoms: ?Increased hunger. ?Increased urination. ?Increased thirst. ?Fatigue. ?Vision changes, such as blurry vision. ?Get help right away if you: ?Have shortness of breath. ?Feel confused. ?Vomit or feel like you may vomit. ?Summary ?Prediabetes is when your blood sugar (blood glucose)level is higher than normal but not high enough for you to be diagnosed with type 2 diabetes. ?Having prediabetes puts you at risk for developing type 2 diabetes (type 2 diabetes mellitus). ?Make lifestyle changes such as eating a healthy diet and exercising regularly to help prevent diabetes. Lose weight as told by your health care provider. ?This information is not intended to replace advice given to you by your health care provider. Make sure you discuss any questions you have with your health care provider. ?Document Revised: 06/04/2019 Document Reviewed: 06/04/2019 ?Elsevier Patient Education ? [place]. ? ? ?

## 2021-07-26 NOTE — Progress Notes (Signed)
I agree with the above note, plan 

## 2021-07-26 NOTE — Progress Notes (Signed)
? ? ? ?07/26/2021 ?HAZLEIGH MCCLEAVE ?[id number] ?11/05/1950 ? ?Review of pertinent gastrointestinal problems: ?1.  Lynch syndrome.  MLH1 mutation.  EGD October 2022 showed mild gastritis, H. pylori negative on biopsies.  Colonoscopy February 2020 showed left-sided diverticulosis and a 3 mm tubular adenoma. ? ? ?HISTORY OF PRESENT ILLNESS: This is a pleasant 71 year old female who is a patient of Dr. Ardis Hughs.  She follows here regularly with him for EGD and colonoscopy for her history of Lynch syndrome.  She is here today wanting [place] to take over her prescription for her pantoprazole.  She tells me her PCP used to prescribe it but he retired/left the practice and she would just like to continue getting it here.  She would like the prescription renewed for 90-day supply through her [facility] mail away pharmacy.  She says that it controls her symptoms well.  She says on occasion if she eats certain things she still has some breakthrough symptoms, but she knows to avoid certain foods, etc. ? ? ?Past Medical History:  ?Diagnosis Date  ? Allergic rhinitis   ? Allergy   ? Arthritis   ? Brain tumor ([place])   ? Breast cyst 2007  ? Right  ? Cataract   ? surgery 07/2017  ? Depression 2006  ? w/ menopause  ? Family history of breast cancer   ? Family history of colon cancer   ? Family history of kidney cancer 01/15/2018  ? Family history of lung cancer   ? Family history of pancreatic cancer   ? Family history of prostate cancer   ? Family history of uterine cancer   ? GERD (gastroesophageal reflux disease)   ? Glaucoma   ? Heart murmur   ? History of hysterectomy, supracervical   ? Hyperlipidemia   ? Hypertension   ? 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  ? MLH1 [id number] (Splice site)  ? Multinodular goiter   ? PAD (peripheral artery disease) ([place])   ? LE Art [place] 1/18: Occluded prox-mid R SFA // ABIs 9/22: R 0.83; L 1.0  ? Pustular psoriasis   ? Tobacco user   ? ?Past Surgical History:  ?Procedure Laterality Date  ? BRAIN TUMOR  EXCISION  02/24/2021  ? at [facility]  ? BREAST BIOPSY Bilateral 1985  ? CARDIAC CATHETERIZATION    ? CATARACT EXTRACTION Right 07/2017  ? COLONOSCOPY    ? CORONARY STENT INTERVENTION N/A 12/26/2018  ? Procedure: CORONARY STENT INTERVENTION;  Surgeon: Belva Crome, MD;  Location: [facility];  Service: Cardiovascular;  Laterality: N/A;  ? LEFT HEART CATH AND CORONARY ANGIOGRAPHY N/A 12/17/2018  ? Procedure: LEFT HEART CATH AND CORONARY ANGIOGRAPHY;  Surgeon: Belva Crome, MD;  Location: [facility];  Service: Cardiovascular;  Laterality: N/A;  ? SUPRACERVICAL ABDOMINAL HYSTERECTOMY  1986  ? h/o supracervical hysterectomy  ? UPPER GASTROINTESTINAL ENDOSCOPY    ? ? reports that she quit smoking about 3 years ago. Her smoking use included cigarettes. She has a 58.50 pack-year smoking history. She has never used smokeless tobacco. She reports that she does not currently use alcohol. She reports that she does not use drugs. ?family history includes Allergies in her brother; Alzheimer's disease in her mother; Brain cancer in her paternal aunt; Breast cancer (age of onset: 69) in her sister; Breast cancer (age of onset: 5) in her sister; Breast cancer (age of onset: 57) in her sister; Breast cancer (age of onset: 50) in her cousin; Breast cancer (age of onset: 51)  in her maternal aunt; Colon cancer (age of onset: 80) in her paternal grandfather; Deep vein thrombosis in her mother and sister; Heart attack in her maternal grandfather; Heart attack (age of onset: 62) in her brother; Hypertension in her father and mother; Kidney cancer (age of onset: 49) in her cousin; Kidney cancer (age of onset: 22) in her father; Kidney cancer (age of onset: 43) in her cousin; Lung cancer (age of onset: 68) in her paternal uncle; Lung cancer (age of onset: 52) in her maternal uncle; Osteopenia in her mother; Pancreatic cancer (age of onset: 53) in her paternal uncle; Pancreatic cancer (age of onset: 23) in her paternal aunt;  Pancreatic cancer (age of onset: 31) in her paternal aunt; Pneumonia in her father; Prostate cancer (age of onset: 68) in her cousin; Prostate cancer (age of onset: 64) in her maternal uncle; Uterine cancer (age of onset: 32) in her cousin; Uterine cancer (age of onset: 54) in her paternal grandmother. ?Allergies  ?Allergen Reactions  ? Sulfamethoxazole-Trimethoprim Other (See Comments) and Rash  ?  Chills/achy ?Flu like symptoms   ? Conjugated Estrogens   ?  Other reaction(s): rash  ? Wellbutrin [Bupropion Hcl] Rash  ?  rash  ? ? ?  ?Outpatient Encounter Medications as of 07/26/2021  ?Medication Sig  ? AMBULATORY NON FORMULARY MEDICATION Apply 1 drop topically at bedtime. Terbinafine DMSO 3.34% susp  ? Ascorbic Acid (VITAMIN C) 500 MG CAPS Take 1 tablet by mouth daily.  ? aspirin 81 MG EC tablet Take 81 mg by mouth every evening.   ? B Complex Vitamins (VITAMIN B COMPLEX) TABS Take 1 tablet by mouth daily.  ? benazepril (LOTENSIN) 20 MG tablet Take 1 tablet (20 mg total) by mouth daily.  ? Biotin 1000 MCG tablet Take 1 tablet by mouth daily.  ? brimonidine (ALPHAGAN) 0.2 % ophthalmic solution Place 1 drop into both eyes 3 (three) times daily.   ? Calcium Carb-Cholecalciferol (CALCIUM-VITAMIN D) 600-400 MG-UNIT TABS Take 1 tablet by mouth daily.  ? clopidogrel (PLAVIX) 75 MG tablet Take 1 tablet (75 mg total) by mouth daily.  ? dorzolamide-timolol (COSOPT) 22.3-6.8 MG/ML ophthalmic solution Place 1 drop into the right eye 2 (two) times daily.  ? ezetimibe (ZETIA) 10 MG tablet Take 1 tablet (10 mg total) by mouth daily.  ? fexofenadine (ALLEGRA) 60 MG tablet Take 60 mg by mouth as needed for allergies or rhinitis.  ? folic acid (FOLVITE) 1 MG tablet Take 1 mg by mouth daily.   ? hydrochlorothiazide (MICROZIDE) 12.5 MG capsule Take 1 capsule (12.5 mg total) by mouth every Monday, Wednesday, and Friday.  ? hydrocortisone (CORTEF) 10 MG tablet Take 10 mg by mouth daily.  ? hyoscyamine (LEVSIN SL) 0.125 MG SL tablet  DISSOLVE 1 TO 2 TABLETS IN MOUTH UNDER THE TONGUE TWICE DAILY AS NEEDED  ? ipratropium (ATROVENT) 0.06 % nasal spray Place 2 sprays into both nostrils 3 (three) times daily.  ? methotrexate (RHEUMATREX) 2.5 MG tablet Take by mouth once a week. Mondays Take 6 tablets  ? metoprolol succinate (TOPROL-XL) 25 MG 24 hr tablet Take 1 tablet by mouth once daily  ? Multiple Vitamins-Minerals (MULTIVITAMIN WITH MINERALS) tablet Take 1 tablet by mouth daily.  ? Omega-3 Fatty Acids (FISH OIL) 1000 MG CAPS Take 1 capsule by mouth daily.   ? pantoprazole (PROTONIX) 40 MG tablet TAKE ONE TABLET BY MOUTH DAILY  ? rosuvastatin (CRESTOR) 20 MG tablet TAKE ONE TABLET BY MOUTH DAILY  ? Theba ARIA 50  MG/4ML SOLN injection Inject 50 mg into the vein. Every 60 Days  ? Specialty Vitamins Products (COLLAGEN ULTRA) CAPS Take 1 capsule by mouth daily.  ? ?No facility-administered encounter medications on file as of 07/26/2021.  ? ? ? ?REVIEW OF SYSTEMS  : All other systems reviewed and negative except where noted in the History of Present Illness. ? ? ?PHYSICAL EXAM: ?BP 122/60   Pulse (!) 53   Ht 5' 6"  (1.676 m) Comment: height measured without shoes  Wt 221 lb 8 oz (100.5 kg)   LMP 03/19/1998 Comment: h/o supracervical hysterectomy  BMI 35.75 kg/m?  ?General: Well developed female in no acute distress ?Head: Normocephalic and atraumatic ?Eyes:  Sclerae anicteric, conjunctiva pink. ?Ears: Normal auditory acuity ?Lungs: Clear throughout to auscultation; no W/R/R. ?Heart: Regular rate and rhythm; no M/R/G. ?Abdomen: Soft, non-distended.  BS present.  Non-tender. ?Musculoskeletal: Symmetrical with no gross deformities  ?Skin: No lesions on visible extremities ?Extremities: No edema  ?Neurological: Alert oriented x 4, grossly non-focal ?Psychological:  Alert and cooperative. Normal mood and affect ? ?ASSESSMENT AND PLAN: ?*GERD: Well-controlled on pantoprazole 40 mg daily.  Her PCP used to prescribe this for her.  She would like [place] to  prescribe it for her now as her last PCP has left/retired.  We will send prescription for 90-day supply with enough refills for a year. ? ? ?CC:  Wendie Agreste, MD ? ?  ?

## 2021-07-26 NOTE — Patient Instructions (Signed)
If you are age 70 or older, your body mass index should be between 23-30. Your Body mass index is 35.75 kg/m?[name] If this is out of the aforementioned range listed, please consider follow up with your Primary Care Provider. ?________________________________________________________ ? ?The [facility] providers would like to encourage you to use [facility] to communicate with providers for non-urgent requests or questions.  Due to long hold times on the telephone, sending your provider a message by [facility] may be a faster and more efficient way to get a response.  Please allow 48 business hours for a response.  Please remember that this is for non-urgent requests.  ?_______________________________________________________ ? ?We have sent the following medications to your pharmacy for you to pick up at your convenience: ? ?Pantoprazole '40mg'$  one tablet daily ? ?Thank you for entrusting me with your care and choosing [facility]. ? ?Alonza Bogus, PA-C ?

## 2021-08-01 ENCOUNTER — Ambulatory Visit: Payer: Medicare Other | Admitting: Neurology

## 2021-08-03 ENCOUNTER — Ambulatory Visit (INDEPENDENT_AMBULATORY_CARE_PROVIDER_SITE_OTHER): Payer: Medicare Other

## 2021-08-03 DIAGNOSIS — E230 Hypopituitarism: Secondary | ICD-10-CM | POA: Insufficient documentation

## 2021-08-03 DIAGNOSIS — Z0000 Encounter for general adult medical examination without abnormal findings: Secondary | ICD-10-CM

## 2021-08-03 DIAGNOSIS — M059 Rheumatoid arthritis with rheumatoid factor, unspecified: Secondary | ICD-10-CM | POA: Insufficient documentation

## 2021-08-03 DIAGNOSIS — M47819 Spondylosis without myelopathy or radiculopathy, site unspecified: Secondary | ICD-10-CM | POA: Insufficient documentation

## 2021-08-03 DIAGNOSIS — E2740 Unspecified adrenocortical insufficiency: Secondary | ICD-10-CM | POA: Insufficient documentation

## 2021-08-03 DIAGNOSIS — L409 Psoriasis, unspecified: Secondary | ICD-10-CM | POA: Insufficient documentation

## 2021-08-03 NOTE — Progress Notes (Signed)
Subjective:   Pamela Lowe is a 71 y.o. female who presents for Medicare Annual (Subsequent) preventive examination.  Virtual Visit via Telephone Note  I connected with  Pamela Lowe on 08/03/21 at 11:00 AM EDT by telephone and verified that I am speaking with the correct person using two identifiers.  Location: Patient: home Provider: office Persons participating in the virtual visit: patient/Nurse Health Advisor   I discussed the limitations, risks, security and privacy concerns of performing an evaluation and management service by telephone and the availability of in person appointments. The patient expressed understanding and agreed to proceed.  Interactive audio and video telecommunications were attempted between this nurse and patient, however failed, due to patient having technical difficulties OR patient did not have access to video capability.  We continued and completed visit with audio only.  Some vital signs may be absent or patient reported.   Clemetine Marker, LPN   Review of Systems     Cardiac Risk Factors include: advanced age (>40mn, >>2women);hypertension;dyslipidemia     Objective:    There were no vitals filed for this visit. There is no height or weight on file to calculate BMI.     08/03/2021   11:21 AM 11/02/2019    5:07 PM 09/23/2019    1:27 PM 08/02/2019    9:52 PM 12/26/2018    8:57 AM 12/17/2018    8:11 AM 09/23/2017    4:48 AM  Advanced Directives  Does Patient Have a Medical Advance Directive? _0  No No  Would patient like information on creating a medical advance directive? No - Patient declined   No - Patient declined No - Patient declined      Current Medications (verified) Outpatient Encounter Medications as of 08/03/2021  Medication Sig   AMBULATORY NON FORMULARY MEDICATION Apply 1 drop topically at bedtime. Terbinafine DMSO 3.34% susp   Ascorbic Acid (VITAMIN C) 500 MG CAPS Take 1 tablet by mouth daily.   aspirin 81 MG EC  tablet Take 81 mg by mouth every evening.    B Complex Vitamins (VITAMIN B COMPLEX) TABS Take 1 tablet by mouth daily.   benazepril (LOTENSIN) 20 MG tablet Take 1 tablet (20 mg total) by mouth daily.   Biotin 1000 MCG tablet Take 1 tablet by mouth daily.   brimonidine (ALPHAGAN) 0.2 % ophthalmic solution Place 1 drop into both eyes 3 (three) times daily.    Calcium Carb-Cholecalciferol (CALCIUM-VITAMIN D) 600-400 MG-UNIT TABS Take 1 tablet by mouth daily.   clopidogrel (PLAVIX) 75 MG tablet Take 1 tablet (75 mg total) by mouth daily.   dorzolamide-timolol (COSOPT) 22.3-6.8 MG/ML ophthalmic solution Place 1 drop into the right eye 2 (two) times daily.   ezetimibe (ZETIA) 10 MG tablet Take 1 tablet (10 mg total) by mouth daily.   fexofenadine (ALLEGRA) 60 MG tablet Take 60 mg by mouth as needed for allergies or rhinitis.   folic acid (FOLVITE) 1 MG tablet Take 1 mg by mouth daily.    hydrochlorothiazide (MICROZIDE) 12.5 MG capsule Take 1 capsule (12.5 mg total) by mouth every Monday, Wednesday, and Friday.   hydrocortisone (CORTEF) 10 MG tablet Take 10 mg by mouth daily.   hyoscyamine (LEVSIN SL) 0.125 MG SL tablet DISSOLVE 1 TO 2 TABLETS IN MOUTH UNDER THE TONGUE TWICE DAILY AS NEEDED   ipratropium (ATROVENT) 0.06 % nasal spray Place 2 sprays into both nostrils 3 (three) times daily.   methotrexate (RHEUMATREX) 2.5 MG tablet Take by  mouth once a week. Mondays Take 6 tablets   metoprolol succinate (TOPROL-XL) 25 MG 24 hr tablet Take 1 tablet by mouth once daily   Multiple Vitamins-Minerals (MULTIVITAMIN WITH MINERALS) tablet Take 1 tablet by mouth daily.   Omega-3 Fatty Acids (FISH OIL) 1000 MG CAPS Take 1 capsule by mouth daily.    pantoprazole (PROTONIX) 40 MG tablet Take 1 tablet (40 mg total) by mouth daily.   rosuvastatin (CRESTOR) 20 MG tablet TAKE ONE TABLET BY MOUTH DAILY   SIMPONI ARIA 50 MG/4ML SOLN injection Inject 50 mg into the vein. Every 60 Days   Specialty Vitamins Products  (COLLAGEN ULTRA) CAPS Take 1 capsule by mouth daily.   No facility-administered encounter medications on file as of 08/03/2021.    Allergies (verified) Sulfamethoxazole-trimethoprim, Conjugated estrogens, and Wellbutrin [bupropion hcl]   History: Past Medical History:  Diagnosis Date   Allergic rhinitis    Allergy    Arthritis    Brain tumor ([place])    Breast cyst 2007   Right   Cataract    surgery 07/2017   Clotting disorder ([place])    On plavix since stents  for artery blockages   Depression 2006   w/ menopause   Family history of breast cancer    Family history of colon cancer    Family history of kidney cancer 01/15/2018   Family history of lung cancer    Family history of pancreatic cancer    Family history of prostate cancer    Family history of uterine cancer    GERD (gastroesophageal reflux disease)    Glaucoma    Heart murmur    History of hysterectomy, supracervical    Hyperlipidemia    Hypertension    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   MLH1 [id number] (Splice site)   Multinodular goiter    PAD (peripheral artery disease) ([place])    LE Art [place] 1/18: Occluded prox-mid R SFA // ABIs 9/22: R 0.83; L 1.0   Pustular psoriasis    Tobacco user    Past Surgical History:  Procedure Laterality Date   ABDOMINAL HYSTERECTOMY  1995   BRAIN TUMOR EXCISION  02/24/2021   at [facility]     CATARACT EXTRACTION Right 07/2017   COLONOSCOPY     CORONARY STENT INTERVENTION N/A 12/26/2018   Procedure: CORONARY STENT INTERVENTION;  Surgeon: Belva Crome, MD;  Location: [place] [facility];  Service: Cardiovascular;  Laterality: N/A;   EYE SURGERY  2019, 2020   Cataracts, eye shunt implants   LEFT HEART CATH AND CORONARY ANGIOGRAPHY N/A 12/17/2018   Procedure: LEFT HEART CATH AND CORONARY ANGIOGRAPHY;  Surgeon: Belva Crome, MD;  Location: [place] [facility];  Service: Cardiovascular;  Laterality: N/A;   SUPRACERVICAL  ABDOMINAL HYSTERECTOMY  1986   h/o supracervical hysterectomy   UPPER GASTROINTESTINAL ENDOSCOPY     Family History  Problem Relation Age of Onset   Hypertension Mother    Deep vein thrombosis Mother    Alzheimer's disease Mother    Osteopenia Mother    Arthritis Mother    Varicose Veins Mother    Kidney cancer Father 32   Pneumonia Father    Hypertension Father    Arthritis Father    Early death Father    Kidney disease Father    Breast cancer Sister 45       recurrence 24   Cancer Sister    Breast cancer Sister 75  Stage 0   Cancer Sister    Breast cancer Sister 30       Stage 3, bilateral mastectomy   Cancer Sister    Early death Sister    Deep vein thrombosis Sister            Allergies Brother    Early death Brother    Heart attack Brother 78   Breast cancer Maternal Aunt 67   Prostate cancer Maternal Uncle 68   Lung cancer Maternal Uncle 60   Pancreatic cancer Paternal Aunt 64   Pancreatic cancer Paternal Aunt 14   Brain cancer Paternal Aunt    Pancreatic cancer Paternal Uncle 58   Lung cancer Paternal Uncle 33   Heart attack Maternal Grandfather    Uterine cancer Paternal Grandmother 85   Colon cancer Paternal Grandfather 79   Kidney cancer Cousin 76   Kidney cancer Cousin 52   Uterine cancer Cousin 62   Prostate cancer Cousin 57   Breast cancer Cousin 60   Thyroid disease Neg Hx    Rectal cancer Neg Hx    Stomach cancer Neg Hx    Social History   Socioeconomic History   Marital status: Married    Spouse name: [name]   Number of children: 0   Years of education: Not on file   Highest education level: Master's degree (e.g., MA, MS, MEng, MEd, MSW, MBA)  Occupational History   Occupation: Special Ed Pharmacist, hospital    Comment: Engineer, manufacturing  Tobacco Use   Smoking status: Former    Packs/day: 0.25    Years: 36.00    Pack years: 9.00    Types: Cigarettes    Quit date: 08/01/2017    Years since quitting: 4.0   Smokeless tobacco: Never   Tobacco  comments:    Started smoking at about 71 yrs old  Vaping Use   Vaping Use: Never used  Substance and Sexual Activity   Alcohol use: Not Currently   Drug use: No   Sexual activity: Not Currently    Partners: Male    Birth control/protection: Post-menopausal, Surgical    Comment: Hysterectomy  Other Topics Concern   Not on file  Social History Narrative   Not on file   Social Determinants of Health   Financial Resource Strain: Low Risk    Difficulty of Paying Living Expenses: Not hard at all  Food Insecurity: No Food Insecurity   Worried About Charity fundraiser in the Last Year: Never true   [place] in the Last Year: Never true  Transportation Needs: No Transportation Needs   Lack of Transportation (Medical): No   Lack of Transportation (Non-Medical): No  Physical Activity: Sufficiently Active   Days of Exercise per Week: 6 days   Minutes of Exercise per Session: 30 min  Stress: No Stress Concern Present   Feeling of Stress : Not at all  Social Connections: Moderately Integrated   Frequency of Communication with Friends and Family: More than three times a week   Frequency of Social Gatherings with Friends and Family: More than three times a week   Attends Religious Services: More than 4 times per year   Active Member of [employer] or Organizations: No   Attends Archivist Meetings: Never   Marital Status: Married    Tobacco Counseling Counseling given: Not Answered Tobacco comments: Started smoking at about 71 yrs old   Clinical Intake:  Pre-visit preparation completed: Yes  Pain : No/denies pain  Nutritional Risks: None Diabetes: No  How often do you need to have someone help you when you read instructions, pamphlets, or other written materials from your doctor or pharmacy?: 1 - Never    Interpreter Needed?: No  Information entered by :: Clemetine Marker LPN   Activities of Daily Living    08/03/2021   11:25 AM  In your present state of  health, do you have any difficulty performing the following activities:  Hearing? 0  Vision? 0  Difficulty concentrating or making decisions? 0  Walking or climbing stairs? 1  Dressing or bathing? 0  Doing errands, shopping? 0  Preparing Food and eating ? N  Using the Toilet? N  In the past six months, have you accidently leaked urine? Y  Comment occasional with sneezing  Do you have problems with loss of bowel control? N  Managing your Medications? N  Managing your Finances? N  Housekeeping or managing your Housekeeping? N    Patient Care Team: Wendie Agreste, MD as PCP - General (Family Medicine) Belva Crome, MD as Consulting Physician (Cardiology) Marylin Crosby, MD as Referring Physician (Ophthalmology) Ander Slade Carlisle Beers, MD as Referring Physician (Ophthalmology) Felecia Shelling, Nanine Means, MD (Neurology) Milus Banister, MD as Attending Physician (Gastroenterology) Lisabeth Pick, MD as Consulting Physician (Ophthalmology) Valinda Party, MD (Rheumatology) Jacelyn Pi, MD as Referring Physician (Endocrinology) Megan Salon, MD as Consulting Physician (Obstetrics and Gynecology) Wendee Beavers, Deidre Ala, MD as Referring Physician (Otolaryngology) Augusto Garbe, MD (Neurosurgery)  Indicate any recent Medical Services you may have received from other than [facility] providers in the past year (date may be approximate).     Assessment:   This is a routine wellness examination for Ver[place].  Hearing/Vision screen Hearing Screening - Comments:: Pt denies hearing difficulty  Vision Screening - Comments:: Annual vision screenings done by ophthalmology; sees specialist for glaucoma and uveitis  Dietary issues and exercise activities discussed: Current Exercise Habits: Home exercise routine;Structured exercise class, Type of exercise: calisthenics;strength training/weights;Other - see comments (exercise bike), Time (Minutes): 60, Frequency (Times/Week): 6, Weekly Exercise  (Minutes/Week): 360, Intensity: Moderate, Exercise limited by: orthopedic condition(s)   Goals Addressed             This Visit's Progress    Weight (lb) < 200 lb (90.7 kg)       Pt would like to lose weight over the next year with healthy eating and physical activity        Depression Screen    08/03/2021   11:19 AM 12/08/2020    1:26 PM 01/29/2019    3:10 PM  PHQ 2/9 Scores  PHQ - 2 Score 0 0 1    Fall Risk    08/03/2021   11:25 AM 12/08/2020    1:26 PM  Fall Risk   Falls in the past year? 0 0  Number falls in past yr: 0 0  Injury with Fall? 0 0  Risk for fall due to : No Fall Risks   Follow up Falls prevention discussed     [place]:  Any stairs in or around the home? Yes  If so, are there any without handrails? No  Home free of loose throw rugs in walkways, pet beds, electrical cords, etc? Yes  Adequate lighting in your home to reduce risk of falls? Yes   ASSISTIVE DEVICES UTILIZED TO PREVENT FALLS:  Life alert? No  Use of a cane, walker or w/c? Yes Grab  bars in the bathroom? Yes  Shower chair or bench in shower? No  Elevated toilet seat or a handicapped toilet? Yes   TIMED UP AND GO:  Was the test performed? No . Telephonic visit.   Cognitive Function: Normal cognitive status assessed by direct observation by this Nurse Health Advisor. No abnormalities found.        09/21/2016   10:41 AM  Montreal Cognitive Assessment   Visuospatial/ Executive (0/5) 5  Naming (0/3) 3  Attention: Read list of digits (0/2) 2  Attention: Read list of letters (0/1) 1  Attention: Serial 7 subtraction starting at 100 (0/3) 3  Language: Repeat phrase (0/2) 2  Language : Fluency (0/1) 1  Abstraction (0/2) 2  Delayed Recall (0/5) 5  Orientation (0/6) 6  Total 30      Immunizations Immunization History  Administered Date(s) Administered   Influenza, High Dose Seasonal PF 01/08/2020   Influenza-Unspecified 12/13/2017, 12/05/2020    PFIZER Comirnaty(Gray Top)Covid-19 Tri-Sucrose Vaccine 08/01/2020   PFIZER(Purple Top)SARS-COV-2 Vaccination 04/24/2019, 05/19/2019, 12/01/2019   Pfizer Covid-19 Vaccine Bivalent Booster 31yr & up 01/27/2021   Pneumococcal Conjugate-13 01/08/2020   Tdap 03/19/2006, 11/21/2018   Zoster Recombinat (Shingrix) 12/03/2018, 02/06/2019    TDAP status: Up to date  Flu Vaccine status: Up to date  Pneumococcal vaccine status: Up to date  Covid-19 vaccine status: Completed vaccines  Qualifies for Shingles Vaccine? Yes   Zostavax completed: No Shingrix Completed?: Yes  Screening Tests Health Maintenance  Topic Date Due   Pneumonia Vaccine 70 Years old (2 - PPSV23 if available, else PCV20) 01/07/2021   INFLUENZA VACCINE  10/17/2021   COLONOSCOPY (Pts 45-47yrInsurance coverage will need to be confirmed)  12/26/2021   MAMMOGRAM  05/25/2023   TETANUS/TDAP  11/20/2028   DEXA SCAN  Completed   COVID-19 Vaccine  Completed   Hepatitis C Screening  Completed   Zoster Vaccines- Shingrix  Completed   HPV VACCINES  Aged Out    Health Maintenance  Health Maintenance Due  Topic Date Due   Pneumonia Vaccine 70Years old (2 - PPSV23 if available, else PCV20) 01/07/2021    Colorectal cancer screening: Type of screening: Colonoscopy. Completed 12/26/20. Repeat every 1 years  Mammogram status: Completed 05/24/21. Repeat every year  Bone Density status: Completed 05/05/19. Results reflect: Bone density results: NORMAL. Repeat every 3-5 years.  Lung Cancer Screening: (Low Dose CT Chest recommended if Age 70-80ears, 30 pack-year currently smoking OR have quit w/in 15years.) does qualify. Completed 04/18/21  Additional Screening:  Hepatitis C Screening: does qualify; Completed 02/15/20  Vision Screening: Recommended annual ophthalmology exams for early detection of glaucoma and other disorders of the eye. Is the patient up to date with their annual eye exam?  Yes  Who is the provider or  what is the name of the office in which the patient attends annual eye exams? 3 different ophthalmologist for different eye problems.   Dental Screening: Recommended annual dental exams for proper oral hygiene  Community Resource Referral / Chronic Care Management: CRR required this visit?  No   CCM required this visit?  No      Plan:     I have personally reviewed and noted the following in the patient's chart:   Medical and social history Use of alcohol, tobacco or illicit drugs  Current medications and supplements including opioid prescriptions.  Functional ability and status Nutritional status Physical activity Advanced directives List of other physicians Hospitalizations, surgeries, and ER visits in previous 1262  months Vitals Screenings to include cognitive, depression, and falls Referrals and appointments  In addition, I have reviewed and discussed with patient certain preventive protocols, quality metrics, and best practice recommendations. A written personalized care plan for preventive services as well as general preventive health recommendations were provided to patient.     Clemetine Marker, LPN   [id number]   Nurse Notes: none

## 2021-08-03 NOTE — Patient Instructions (Signed)
Pamela Lowe , Thank you for taking time to come for your Medicare Wellness Visit. I appreciate your ongoing commitment to your health goals. Please review the following plan we discussed and let me know if I can assist you in the future.   Screening recommendations/referrals: Colonoscopy: done 12/26/20. Repeat 12/2021 Mammogram: done 05/24/21 Bone Density: done 05/05/19 Recommended yearly ophthalmology/optometry visit for glaucoma screening and checkup Recommended yearly dental visit for hygiene and checkup  Vaccinations: Influenza vaccine: done 12/05/20 Pneumococcal vaccine: done 01/08/20 Tdap vaccine: done 11/21/18 Shingles vaccine: done 12/03/18 & 02/06/19   Covid-19:done 04/24/19, 05/19/19, 12/01/19, 08/01/20 & 01/27/21  Advanced directives: Advance directive discussed with you today. You may obtain a copy from our office for you to complete at home and have notarized. Once this is complete please bring a copy in to our office so we can scan it into your chart.   Conditions/risks identified: Keep up the great work!  Next appointment: Follow up in one year for your annual wellness visit    Preventive Care 65 Years and Older, Female Preventive care refers to lifestyle choices and visits with your health care provider that can promote health and wellness. What does preventive care include? A yearly physical exam. This is also called an annual well check. Dental exams once or twice a year. Routine eye exams. Ask your health care provider how often you should have your eyes checked. Personal lifestyle choices, including: Daily care of your teeth and gums. Regular physical activity. Eating a healthy diet. Avoiding tobacco and drug use. Limiting alcohol use. Practicing safe sex. Taking low-dose aspirin every day. Taking vitamin and mineral supplements as recommended by your health care provider. What happens during an annual well check? The services and screenings done by your health care  provider during your annual well check will depend on your age, overall health, lifestyle risk factors, and family history of disease. Counseling  Your health care provider may ask you questions about your: Alcohol use. Tobacco use. Drug use. Emotional well-being. Home and relationship well-being. Sexual activity. Eating habits. History of falls. Memory and ability to understand (cognition). Work and work Statistician. Reproductive health. Screening  You may have the following tests or measurements: Height, weight, and BMI. Blood pressure. Lipid and cholesterol levels. These may be checked every 5 years, or more frequently if you are over 2 years old. Skin check. Lung cancer screening. You may have this screening every year starting at age 64 if you have a 30-pack-year history of smoking and currently smoke or have quit within the past 15 years. Fecal occult blood test (FOBT) of the stool. You may have this test every year starting at age 73. Flexible sigmoidoscopy or colonoscopy. You may have a sigmoidoscopy every 5 years or a colonoscopy every 10 years starting at age 80. Hepatitis C blood test. Hepatitis B blood test. Sexually transmitted disease (STD) testing. Diabetes screening. This is done by checking your blood sugar (glucose) after you have not eaten for a while (fasting). You may have this done every 1-3 years. Bone density scan. This is done to screen for osteoporosis. You may have this done starting at age 37. Mammogram. This may be done every 1-2 years. Talk to your health care provider about how often you should have regular mammograms. Talk with your health care provider about your test results, treatment options, and if necessary, the need for more tests. Vaccines  Your health care provider may recommend certain vaccines, such as: Influenza vaccine. This is  recommended every year. Tetanus, diphtheria, and acellular pertussis (Tdap, Td) vaccine. You may need a Td  booster every 10 years. Zoster vaccine. You may need this after age 38. Pneumococcal 13-valent conjugate (PCV13) vaccine. One dose is recommended after age 74. Pneumococcal polysaccharide (PPSV23) vaccine. One dose is recommended after age 39. Talk to your health care provider about which screenings and vaccines you need and how often you need them. This information is not intended to replace advice given to you by your health care provider. Make sure you discuss any questions you have with your health care provider. Document Released: 04/01/2015 Document Revised: 11/23/2015 Document Reviewed: 01/04/2015 Elsevier Interactive Patient Education  2017 [place] Prevention in the Home Falls can cause injuries. They can happen to people of all ages. There are many things you can do to make your home safe and to help prevent falls. What can I do on the outside of my home? Regularly fix the edges of walkways and driveways and fix any cracks. Remove anything that might make you trip as you walk through a door, such as a raised step or threshold. Trim any bushes or trees on the path to your home. Use bright outdoor lighting. Clear any walking paths of anything that might make someone trip, such as rocks or tools. Regularly check to see if handrails are loose or broken. Make sure that both sides of any steps have handrails. Any raised decks and porches should have guardrails on the edges. Have any leaves, snow, or ice cleared regularly. Use sand or salt on walking paths during winter. Clean up any spills in your garage right away. This includes oil or grease spills. What can I do in the bathroom? Use night lights. Install grab bars by the toilet and in the tub and shower. Do not use towel bars as grab bars. Use non-skid mats or decals in the tub or shower. If you need to sit down in the shower, use a plastic, non-slip stool. Keep the floor dry. Clean up any water that spills on the floor  as soon as it happens. Remove soap buildup in the tub or shower regularly. Attach bath mats securely with double-sided non-slip rug tape. Do not have throw rugs and other things on the floor that can make you trip. What can I do in the bedroom? Use night lights. Make sure that you have a light by your bed that is easy to reach. Do not use any sheets or blankets that are too big for your bed. They should not hang down onto the floor. Have a firm chair that has side arms. You can use this for support while you get dressed. Do not have throw rugs and other things on the floor that can make you trip. What can I do in the kitchen? Clean up any spills right away. Avoid walking on wet floors. Keep items that you use a lot in easy-to-reach places. If you need to reach something above you, use a strong step stool that has a grab bar. Keep electrical cords out of the way. Do not use floor polish or wax that makes floors slippery. If you must use wax, use non-skid floor wax. Do not have throw rugs and other things on the floor that can make you trip. What can I do with my stairs? Do not leave any items on the stairs. Make sure that there are handrails on both sides of the stairs and use them. Fix handrails that are  broken or loose. Make sure that handrails are as long as the stairways. Check any carpeting to make sure that it is firmly attached to the stairs. Fix any carpet that is loose or worn. Avoid having throw rugs at the top or bottom of the stairs. If you do have throw rugs, attach them to the floor with carpet tape. Make sure that you have a light switch at the top of the stairs and the bottom of the stairs. If you do not have them, ask someone to add them for you. What else can I do to help prevent falls? Wear shoes that: Do not have high heels. Have rubber bottoms. Are comfortable and fit you well. Are closed at the toe. Do not wear sandals. If you use a stepladder: Make sure that it is  fully opened. Do not climb a closed stepladder. Make sure that both sides of the stepladder are locked into place. Ask someone to hold it for you, if possible. Clearly mark and make sure that you can see: Any grab bars or handrails. First and last steps. Where the edge of each step is. Use tools that help you move around (mobility aids) if they are needed. These include: Canes. Walkers. Scooters. Crutches. Turn on the lights when you go into a dark area. Replace any light bulbs as soon as they burn out. Set up your furniture so you have a clear path. Avoid moving your furniture around. If any of your floors are uneven, fix them. If there are any pets around you, be aware of where they are. Review your medicines with your doctor. Some medicines can make you feel dizzy. This can increase your chance of falling. Ask your doctor what other things that you can do to help prevent falls. This information is not intended to replace advice given to you by your health care provider. Make sure you discuss any questions you have with your health care provider. Document Released: 12/30/2008 Document Revised: 08/11/2015 Document Reviewed: 04/09/2014 Elsevier Interactive Patient Education  2017 [employer].

## 2021-08-08 ENCOUNTER — Ambulatory Visit (INDEPENDENT_AMBULATORY_CARE_PROVIDER_SITE_OTHER): Payer: Medicare Other | Admitting: Podiatry

## 2021-08-08 ENCOUNTER — Ambulatory Visit (INDEPENDENT_AMBULATORY_CARE_PROVIDER_SITE_OTHER): Payer: Medicare Other

## 2021-08-08 DIAGNOSIS — M057A Rheumatoid arthritis with rheumatoid factor of other specified site without organ or systems involvement: Secondary | ICD-10-CM | POA: Diagnosis not present

## 2021-08-08 DIAGNOSIS — M2142 Flat foot [pes planus] (acquired), left foot: Secondary | ICD-10-CM | POA: Diagnosis not present

## 2021-08-08 DIAGNOSIS — M2141 Flat foot [pes planus] (acquired), right foot: Secondary | ICD-10-CM | POA: Diagnosis not present

## 2021-08-08 NOTE — Progress Notes (Signed)
  Subjective:  Patient ID: Pamela Lowe, female    DOB: 06/23/1950,  MRN: [id number]  Chief Complaint  Patient presents with   Foot Orthotics    (est) interested in geting orthotics    71 y.o. female presents with the above complaint. History confirmed with patient.  Orthotics have been helpful for her in the past and allow her to continue her daily function she is interested in getting new ones made.  Objective:  Physical Exam: warm, good capillary refill, no trophic changes or ulcerative lesions, normal DP and PT pulses, normal sensory exam, and flexible pes planus noted.  Assessment:   1. Pes planus of both feet      Plan:  Patient was evaluated and treated and all questions answered.  Discussed further treatment options in detail including supportive shoe gear and orthotics.  She is done well with these in the past.  She was seen by our pedorthist today for new orthotics to be made she is interested in having a shorter summer dress pair made that often her dress shoes.  I will see her back as needed.  No follow-ups on file.

## 2021-08-08 NOTE — Progress Notes (Signed)
SITUATION Reason for Consult: Evaluation for Bilateral Custom Foot Orthoses Patient / Caregiver Report: Patient is ready for foot orthotics  OBJECTIVE DATA: Patient History / Diagnosis:    ICD-10-CM   1. Rheumatoid arthritis of other site with positive rheumatoid factor (HCC)  M05.7A       Current or Previous Devices:   Current user  Foot Examination: Skin presentation:   Intact Ulcers & Callousing:   None Toe / Foot Deformities:  Hallux valgus Weight Bearing Presentation:  Rectus Sensation:    Intact  Shoe Size:    17M  ORTHOTIC RECOMMENDATION Recommended Device: 1x pair of custom functional foot orthotics  GOALS OF ORTHOSES - Reduce Pain - Prevent Foot Deformity - Prevent Progression of Further Foot Deformity - Relieve Pressure - Improve the Overall Biomechanical Function of the Foot and Lower Extremity.  ACTIONS PERFORMED Potential out of pocket cost was communicated to patient. Patient understood and consent to casting. Patient was casted for Foot Orthoses via crush box. Procedure was explained and patient tolerated procedure well. Casts were shipped to central fabrication. All questions were answered and concerns addressed.  PLAN Patient is to be called for fitting when devices are ready.

## 2021-08-10 ENCOUNTER — Ambulatory Visit (INDEPENDENT_AMBULATORY_CARE_PROVIDER_SITE_OTHER): Payer: Medicare Other | Admitting: Neurology

## 2021-08-10 ENCOUNTER — Encounter: Payer: Self-pay | Admitting: Neurology

## 2021-08-10 VITALS — BP 119/64 | HR 49 | Ht 67.0 in | Wt 217.0 lb

## 2021-08-10 DIAGNOSIS — I25118 Atherosclerotic heart disease of native coronary artery with other forms of angina pectoris: Secondary | ICD-10-CM

## 2021-08-10 DIAGNOSIS — H5340 Unspecified visual field defects: Secondary | ICD-10-CM

## 2021-08-10 DIAGNOSIS — F32A Depression, unspecified: Secondary | ICD-10-CM

## 2021-08-10 DIAGNOSIS — R413 Other amnesia: Secondary | ICD-10-CM

## 2021-08-10 DIAGNOSIS — M5412 Radiculopathy, cervical region: Secondary | ICD-10-CM

## 2021-08-10 DIAGNOSIS — E559 Vitamin D deficiency, unspecified: Secondary | ICD-10-CM | POA: Diagnosis not present

## 2021-08-10 DIAGNOSIS — D329 Benign neoplasm of meninges, unspecified: Secondary | ICD-10-CM

## 2021-08-10 DIAGNOSIS — E230 Hypopituitarism: Secondary | ICD-10-CM

## 2021-08-10 NOTE — Addendum Note (Signed)
Addended by: Arlice Colt A on: 08/10/2021 03:50 PM   Modules accepted: Level of Service

## 2021-08-10 NOTE — Progress Notes (Signed)
[facility]  PATIENT: Pamela Lowe DOB: 1951-03-04  REFERRING DOCTOR OR PCP: Lona Kettle MD; Lazaro Arms, PA-C; [facility]. Theda Sers [facility]) SOURCE: Patient, notes from PCP, imaging reports, MRI images personally reviewed.  _________________________________   HISTORICAL  CHIEF COMPLAINT:  Chief Complaint  Patient presents with   Follow-up    Pt alone, rm 1. She is noting that since the surgery she is noticing memory concerns. Overall doing well post op. She has brought her recent MRI post surgery  she thinks was completed mar/April.     HISTORY OF PRESENT ILLNESS:  Pamela Lowe is a 71 y.o. woman with a suprasellar meningioma (resected 02/24/2021), , visual changes and shoulder and arm pain and left arm numbness.  Update 08/10/2021: She was having more vision problems and had a repeat brain MRI.   It showed "Increased thickening and nodular enhancement along the anterior aspect of the resection cavity favored to represent residual/recurrent meningioma.  However, this does not appear to abut the optic chiasm or preorbital optic nerves. ".  We discussed likely the original tumor did some permanent optic nerve damage.    VF is worse with bi-temporal field cuts.   She will be seeing a neuro-ophthalmologist as well.   She is on acetazolamide pills and eye drops.    She is also reporting memory issues.   MoCA today was 23/30 and she lost 3 points for short term recall.   However, with hints, she was 5/5   Memory is better with hints in general.   She is doing brain Apps on phone.     She is sleeping well most nights 6/5-7 hours.   She has not been told that she snores.   She wakes up just 0-1 tines a noght (nocturia).      She quickly falls back asleep.  She feels mood is a little worse with health and family stress.    She now is dependent on others for driving due to her field cut and the loss of independence is bothersome.      She has moderate chronic  microvascular ischemic change.  She has HTN (since 2005), former smoker [phone], much of it 2 ppd), prediabetes and adrenal insufficient since surgey  She has RA and is on Simponi and MTX.   Shoulder pain and  arm pain I beter.      She had an MRI performed 08/25/2020 that shows severe bilateral neural foraminal narrowing at C4C5 affecting the C5 nerve roots and changes at C5-C6 that could affect the left C6 nerve root.  At the last visit, we did left subacromial bursa injection and pain has been much better since.  She feels range of motion is normal in the shoulder now.  Tumor history: I had noticed a sellar mass on the cervical spine MRI in 2022 and a pituitary MRI 10/26/2020 was performed confirming the mass and that it was compressing the optic nerves.  She had the suprasellar mass resected at [place] (02/24/2021 Dr. Mervyn Gay - [facility], [facility]).  It turned out o be a suprasellar meningioma.   It was displacing the optic nerves.   S Because she has uveitis and glaucoma, visual changes were felt to be related to her existing problems initially.  The pathology was meningioma WHO grade I.        08/10/2021   10:59 AM 09/21/2016   10:41 AM  Montreal Cognitive Assessment   Visuospatial/ Executive (0/5) 4 5  Naming (0/3)  3 3  Attention: Read list of digits (0/2) 1 2  Attention: Read list of letters (0/1) 1 1  Attention: Serial 7 subtraction starting at 100 (0/3) 2 3  Language: Repeat phrase (0/2) 2 2  Language : Fluency (0/1) 1 1  Abstraction (0/2) 2 2  Delayed Recall (0/5) 2 5  Orientation (0/6) 5 6  Total 23 30    Imaging reviewed today MRI of the brain 06/02/2021 (performed at [facility]) was personally reviewed.  The pituitary gland is just minimally enlarged on the right and there is no impingement of the optic nerves.  The brain shows moderate chronic microvascular ischemic changes, essentially unchanged compared to the previous MRI.[name]  Brain volume was normal for age.  MRI of the brain  10/26/2020 showed 25 x 21 x 15 mm sellar/suprasellar mass upwardly displacing the optic nerves.  It has fairly homogenous enhancement after contrast.  This is consistent with a pituitary macroadenoma.     Extensive T2/FLAIR hyperintense foci in the hemispheres, right thalamus and a few foci in the brainstem and cerebellum consistent with moderate chronic microvascular ischemic changes   MRI of the cervical spine 08/25/2020.  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  The spine shows large disc osteophyte complexes at C4-C5 and C5-C6.  At C4-C5, there is probable left and possible right C5 nerve root compression.  At C5-C6, there is potential left C6 nerve root compression.  There is also minimal retrolisthesis of C5 upon C6.  There are milder degenerative changes at C6-C7 more to the left that probably do not cause any nerve root compression.   REVIEW OF SYSTEMS: Constitutional: No fevers, chills, sweats, or change in appetite Eyes: No visual changes, double vision, eye pain Ear, nose and throat: No hearing loss, ear pain, nasal congestion, sore throat Cardiovascular: No chest pain, palpitations Respiratory:  No shortness of breath at rest or with exertion.   No wheezes GastrointestinaI: No nausea, vomiting, diarrhea, abdominal pain, fecal incontinence Genitourinary:  No dysuria, urinary retention or frequency.  No nocturia. Musculoskeletal:  No neck pain, back pain Integumentary: No rash, pruritus, skin lesions Neurological: as above Psychiatric: No depression at this time.  No anxiety Endocrine: No palpitations, diaphoresis, change in appetite, change in weigh or increased thirst Hematologic/Lymphatic:  No anemia, purpura, petechiae. Allergic/Immunologic: No itchy/runny eyes, nasal congestion, recent allergic reactions, rashes  ALLERGIES: Allergies  Allergen Reactions   Sulfamethoxazole-Trimethoprim Other (See Comments) and Rash    Chills/achy Flu like symptoms     Conjugated Estrogens     Other reaction(s): rash   Wellbutrin [Bupropion Hcl] Rash    rash    HOME MEDICATIONS:  Current Outpatient Medications:    AMBULATORY NON FORMULARY MEDICATION, Apply 1 drop topically at bedtime. Terbinafine DMSO 3.34% susp, Disp: , Rfl:    Ascorbic Acid (VITAMIN C) 500 MG CAPS, Take 1 tablet by mouth daily., Disp: , Rfl:    aspirin 81 MG EC tablet, Take 81 mg by mouth every evening. , Disp: , Rfl:    B Complex Vitamins (VITAMIN B COMPLEX) TABS, Take 1 tablet by mouth daily., Disp: , Rfl:    benazepril (LOTENSIN) 20 MG tablet, Take 1 tablet (20 mg total) by mouth daily., Disp: 90 tablet, Rfl: 2   Biotin 1000 MCG tablet, Take 1 tablet by mouth daily., Disp: , Rfl:    brimonidine (ALPHAGAN) 0.2 % ophthalmic solution, Place 1 drop into both eyes 3 (three) times daily. , Disp: , Rfl:  Calcium Carb-Cholecalciferol (CALCIUM-VITAMIN D) 600-400 MG-UNIT TABS, Take 1 tablet by mouth daily., Disp: , Rfl:    clopidogrel (PLAVIX) 75 MG tablet, Take 1 tablet (75 mg total) by mouth daily., Disp: 90 tablet, Rfl: 3   dorzolamide-timolol (COSOPT) 22.3-6.8 MG/ML ophthalmic solution, Place 1 drop into the right eye 2 (two) times daily., Disp: , Rfl:    ezetimibe (ZETIA) 10 MG tablet, Take 1 tablet (10 mg total) by mouth daily., Disp: 90 tablet, Rfl: 2   fexofenadine (ALLEGRA) 60 MG tablet, Take 60 mg by mouth as needed for allergies or rhinitis., Disp: , Rfl:    folic acid (FOLVITE) 1 MG tablet, Take 1 mg by mouth daily. , Disp: , Rfl:    hydrochlorothiazide (MICROZIDE) 12.5 MG capsule, Take 1 capsule (12.5 mg total) by mouth every Monday, Wednesday, and Friday., Disp: 45 capsule, Rfl: 3   hydrocortisone (CORTEF) 10 MG tablet, Take 10 mg by mouth daily., Disp: , Rfl:    hyoscyamine (LEVSIN SL) 0.125 MG SL tablet, DISSOLVE 1 TO 2 TABLETS IN MOUTH UNDER THE TONGUE TWICE DAILY AS NEEDED, Disp: 50 tablet, Rfl: 1   ipratropium (ATROVENT) 0.06 % nasal spray, Place 2 sprays into both nostrils  3 (three) times daily., Disp: , Rfl:    methotrexate (RHEUMATREX) 2.5 MG tablet, Take by mouth once a week. Mondays Take 6 tablets, Disp: , Rfl:    metoprolol succinate (TOPROL-XL) 25 MG 24 hr tablet, Take 1 tablet by mouth once daily, Disp: 90 tablet, Rfl: 2   Multiple Vitamins-Minerals (MULTIVITAMIN WITH MINERALS) tablet, Take 1 tablet by mouth daily., Disp: , Rfl:    Omega-3 Fatty Acids (FISH OIL) 1000 MG CAPS, Take 1 capsule by mouth daily. , Disp: , Rfl:    pantoprazole (PROTONIX) 40 MG tablet, Take 1 tablet (40 mg total) by mouth daily., Disp: 90 tablet, Rfl: 3   rosuvastatin (CRESTOR) 20 MG tablet, TAKE ONE TABLET BY MOUTH DAILY, Disp: 90 tablet, Rfl: 3   SIMPONI ARIA 50 MG/4ML SOLN injection, Inject 50 mg into the vein. Every 60 Days, Disp: , Rfl:    Specialty Vitamins Products (COLLAGEN ULTRA) CAPS, Take 1 capsule by mouth daily., Disp: , Rfl:   PAST MEDICAL HISTORY: Past Medical History:  Diagnosis Date   Allergic rhinitis    Allergy    Arthritis    Brain tumor ([place])    Breast cyst 2007   Right   Cataract    surgery 07/2017   Clotting disorder ([place])    On plavix since stents  for artery blockages   Depression 2006   w/ menopause   Family history of breast cancer    Family history of colon cancer    Family history of kidney cancer 01/15/2018   Family history of lung cancer    Family history of pancreatic cancer    Family history of prostate cancer    Family history of uterine cancer    GERD (gastroesophageal reflux disease)    Glaucoma    Heart murmur    History of hysterectomy, supracervical    Hyperlipidemia    Hypertension    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   MLH1 [id number] (Splice site)   Multinodular goiter    PAD (peripheral artery disease) ([place])    LE Art [place] 1/18: Occluded prox-mid R SFA // ABIs 9/22: R 0.83; L 1.0   Pustular psoriasis    Tobacco user     PAST SURGICAL HISTORY: Past Surgical History:  Procedure Laterality Date   ABDOMINAL  [place] TUMOR EXCISION  02/24/2021   at [place]     CATARACT EXTRACTION Right 07/2017   COLONOSCOPY     CORONARY STENT INTERVENTION N/A 12/26/2018   Procedure: CORONARY STENT INTERVENTION;  Surgeon: Belva Crome, MD;  Location: [facility];  Service: Cardiovascular;  Laterality: N/A;   EYE SURGERY  2019, 2020   Cataracts, eye shunt implants   LEFT HEART CATH AND CORONARY ANGIOGRAPHY N/A 12/17/2018   Procedure: LEFT HEART CATH AND CORONARY ANGIOGRAPHY;  Surgeon: Belva Crome, MD;  Location: [facility];  Service: Cardiovascular;  Laterality: N/A;   SUPRACERVICAL ABDOMINAL HYSTERECTOMY  1986   h/o supracervical hysterectomy   UPPER GASTROINTESTINAL ENDOSCOPY      FAMILY HISTORY: Family History  Problem Relation Age of Onset   Hypertension Mother    Deep vein thrombosis Mother    Alzheimer's disease Mother    Osteopenia Mother    Arthritis Mother    Varicose Veins Mother    Kidney cancer Father 70   Pneumonia Father    Hypertension Father    Arthritis Father    Early death Father    Kidney disease Father    Breast cancer Sister 61       recurrence 23   Cancer Sister    Breast cancer Sister 37       Stage 0   Cancer Sister    Breast cancer Sister 95       Stage 3, bilateral mastectomy   Cancer Sister    Early death Sister    Deep vein thrombosis Sister            Allergies Brother    Early death Brother    Heart attack Brother 49   Breast cancer Maternal Aunt 67   Prostate cancer Maternal Uncle 68   Lung cancer Maternal Uncle 60   Pancreatic cancer Paternal Aunt 64   Pancreatic cancer Paternal Aunt 28   Brain cancer Paternal Aunt    Pancreatic cancer Paternal Uncle 71   Lung cancer Paternal Uncle 6   Heart attack Maternal Grandfather    Uterine cancer Paternal Grandmother 8   Colon cancer Paternal Grandfather 39   Kidney cancer Cousin 52   Kidney cancer Cousin 52    Uterine cancer Cousin 62   Prostate cancer Cousin 57   Breast cancer Cousin 60   Thyroid disease Neg Hx    Rectal cancer Neg Hx    Stomach cancer Neg Hx     SOCIAL HISTORY:  Social History   Socioeconomic History   Marital status: Married    Spouse name: [name]   Number of children: 0   Years of education: Not on file   Highest education level: Master's degree (e.g., MA, MS, MEng, MEd, MSW, MBA)  Occupational History   Occupation: Special Ed Pharmacist, hospital    Comment: Engineer, manufacturing  Tobacco Use   Smoking status: Former    Packs/day: 0.25    Years: 36.00    Pack years: 9.00    Types: Cigarettes    Quit date: 08/01/2017    Years since quitting: 4.0   Smokeless tobacco: Never   Tobacco comments:    Started smoking at about 71 yrs old  Vaping Use   Vaping Use: Never used  Substance and Sexual Activity   Alcohol use: Not Currently   Drug use: No   Sexual activity: Not  Currently    Partners: Male    Birth control/protection: Post-menopausal, Surgical    Comment: Hysterectomy  Other Topics Concern   Not on file  Social History Narrative   Not on file   Social Determinants of Health   Financial Resource Strain: Low Risk    Difficulty of Paying Living Expenses: Not hard at all  Food Insecurity: No Food Insecurity   Worried About Charity fundraiser in the Last Year: Never true   [place] in the Last Year: Never true  Transportation Needs: No Transportation Needs   Lack of Transportation (Medical): No   Lack of Transportation (Non-Medical): No  Physical Activity: Sufficiently Active   Days of Exercise per Week: 6 days   Minutes of Exercise per Session: 30 min  Stress: No Stress Concern Present   Feeling of Stress : Not at all  Social Connections: Moderately Integrated   Frequency of Communication with Friends and Family: More than three times a week   Frequency of Social Gatherings with Friends and Family: More than three times a week   Attends Religious  Services: More than 4 times per year   Active Member of [employer] or Organizations: No   Attends Archivist Meetings: Never   Marital Status: Married  Human resources officer Violence: Not At Risk   Fear of Current or Ex-Partner: No   Emotionally Abused: No   Physically Abused: No   Sexually Abused: No     PHYSICAL EXAM  Vitals:   08/10/21 1050  BP: 119/64  Pulse: (!) 49  Weight: 217 lb (98.4 kg)  Height: 5' 7" (1.702 m)    Body mass index is 33.99 kg/m.   General: The patient is well-developed and well-nourished and in no acute distress  HEENT:  Head is [place]/AT.  Sclera are anicteric.     Neck: No carotid bruits are noted.  The neck is nontender.  Cardiovascular: The heart has a regular rate and rhythm with a normal S1 and S2. There were no murmurs, gallops or rubs.    Skin: Extremities are without rash or  edema.  Musculoskeletal: The neck was fairly nontender but there was reduced range of motion.  She had some tenderness over the subacromial bursa of the left shoulder and had a reduced range of motion.  Neurologic Exam  Mental status: The patient is alert and oriented x 3 at the time of the examination. The patient has apparent normal recent and remote memory, with an apparently normal attention span and concentration ability.   Speech is normal.  Cranial nerves: Extraocular movements are full.  She has greatly reduced temporal visual field in the upper and lower quadrant on the right and lower quafrant worse than upper quadrant on the left.   Facial strength and sensation are fine.  . No obvious hearing deficits are noted.  Motor:  Muscle bulk is normal.   Tone is normal.  Strength was 4+/5 in the left biceps and deltoid and 5/5 elsewhere in the left arm.  Strength is  5 / 5 in the right arm and both legs.  Sensory: Sensory testing is intact to pinprick, soft touch and vibration sensation in all 4 extremities.  Coordination: Cerebellar testing reveals good  finger-nose-finger and heel-to-shin bilaterally.  Gait and station: Station is normal.   Gait is normal. Tandem gait is near normal for age. Romberg is negative.   Reflexes: Deep tendon reflexes are symmetric and normal bilaterally.  DIAGNOSTIC DATA (LABS, IMAGING, TESTING) - I reviewed patient records, labs, notes, testing and imaging myself where available.  Lab Results  Component Value Date   WBC 7.2 11/02/2019   HGB 13.3 11/02/2019   HCT 42.2 11/02/2019   MCV 94.8 11/02/2019   PLT 303 11/02/2019      Component Value Date/Time   NA 139 07/26/2021 1303   NA 147 (H) 12/10/2018 1157   K 3.7 07/26/2021 1303   CL 103 07/26/2021 1303   CO2 30 07/26/2021 1303   GLUCOSE 107 (H) 07/26/2021 1303   BUN 21 07/26/2021 1303   BUN 19 12/10/2018 1157   CREATININE 1.04 07/26/2021 1303   CREATININE 1.41 (H) 12/16/2012 1554   CALCIUM 9.8 07/26/2021 1303   PROT 6.3 05/29/2021 1136   ALBUMIN 3.9 05/29/2021 1136   AST 39 05/29/2021 1136   ALT 50 (H) 05/29/2021 1136   ALKPHOS 64 05/29/2021 1136   BILITOT 0.7 05/29/2021 1136   GFRNONAA 53 (L) 11/02/2019 1814   GFRAA >60 11/02/2019 1814   Lab Results  Component Value Date   CHOL 153 05/29/2021   HDL 82 05/29/2021   LDLCALC 61 05/29/2021   TRIG 45 05/29/2021   CHOLHDL 1.9 05/29/2021   Lab Results  Component Value Date   HGBA1C 5.7 07/26/2021   Lab Results  Component Value Date   VITAMINB12 1,121 09/21/2016   Lab Results  Component Value Date   TSH 0.43 06/08/2019       ASSESSMENT AND PLAN  Meningioma (HCC)  Visual field defect  Cervical radiculopathy  Hypopituitarism ([place]) - Plan: Vitamin B12, VITAMIN D 25 Hydroxy (Vit-D Deficiency, Fractures)  Memory loss - Plan: Vitamin B12, VITAMIN D 25 Hydroxy (Vit-D Deficiency, Fractures)  Vitamin D deficiency - Plan: VITAMIN D 25 Hydroxy (Vit-D Deficiency, Fractures)  Depression, unspecified depression type   She will continue to  f/u at [facility] for suprasellar  meningioma and visual field issues.   She asked about transferring care locally.  I advised her to continue with her MRI of the brain scheduled at [facility] later this year and follow-up visit with the pituitary surgeons.  If they all comfortable with her being followed elsewhere we could do the MRI as for follow-up locally.  The pituitary gland is now normal size and there is no pressure on the optic nerves Shoulder and neck are doing well --- if worsens can repear subacromial bursa injeciton.    The reduced memory appears to be due to reduced focus not to reduce storage.   maybe due to endocrine issues.   Check B12 and D.   She also has some depression that may play a role.   She will f/u 1 year or as needed   Rockland Kotarski A. Felecia Shelling, MD, [facility] 5/32/9924, 26:83 AM Certified in Neurology, Clinical Neurophysiology, Sleep Medicine and Neuroimaging  [facility] [address] [place], [place] [address] [phone]

## 2021-08-11 LAB — VITAMIN B12: Vitamin B-12: 1042 pg/mL (ref 232–1245)

## 2021-08-11 LAB — VITAMIN D 25 HYDROXY (VIT D DEFICIENCY, FRACTURES): Vit D, 25-Hydroxy: 40.6 ng/mL (ref 30.0–100.0)

## 2021-08-16 DIAGNOSIS — Z1159 Encounter for screening for other viral diseases: Secondary | ICD-10-CM | POA: Diagnosis not present

## 2021-08-16 DIAGNOSIS — M059 Rheumatoid arthritis with rheumatoid factor, unspecified: Secondary | ICD-10-CM | POA: Diagnosis not present

## 2021-09-04 ENCOUNTER — Other Ambulatory Visit: Payer: Self-pay | Admitting: *Deleted

## 2021-09-04 ENCOUNTER — Encounter: Payer: Self-pay | Admitting: Neurology

## 2021-09-04 MED ORDER — MODAFINIL 200 MG PO TABS: 200.0000 mg | ORAL_TABLET | Freq: Every day | ORAL | 5 refills | Status: DC

## 2021-09-05 ENCOUNTER — Encounter: Payer: Self-pay | Admitting: Family Medicine

## 2021-09-05 NOTE — Telephone Encounter (Signed)
PA modafinil submitted on CMM. [id number]. Waiting on determination from [facility].

## 2021-09-06 NOTE — Telephone Encounter (Signed)
PA Case: [id number], Status: Approved, Coverage Starts on: 06/07/2021 12:00:00 AM, Coverage Ends on: 09/06/2022 12:00:00 AM.

## 2021-09-11 DIAGNOSIS — H4041X3 Glaucoma secondary to eye inflammation, right eye, severe stage: Secondary | ICD-10-CM | POA: Diagnosis not present

## 2021-09-11 DIAGNOSIS — H4042X2 Glaucoma secondary to eye inflammation, left eye, moderate stage: Secondary | ICD-10-CM | POA: Diagnosis not present

## 2021-09-11 DIAGNOSIS — H53462 Homonymous bilateral field defects, left side: Secondary | ICD-10-CM | POA: Diagnosis not present

## 2021-09-11 DIAGNOSIS — H209 Unspecified iridocyclitis: Secondary | ICD-10-CM | POA: Diagnosis not present

## 2021-09-12 NOTE — Progress Notes (Signed)
    Subjective:    CC: R knee pain  I, Molly Weber, LAT, ATC, am serving as scribe for Dr. Lynne Leader.  HPI: Pt is a 71 y/o female presenting w/ R knee pain x one year after tearing her meniscus in Aug 2022.  She locates her pain to her R medial knee.  She was seen by Dr. Alvan Dame at [facility] in fall 2022 and had an MRI.  Based on the MRI, Dr. Alvan Dame recommended a knee replacement.  She then went to see Dr. Noemi Chapel in Feb 2023 who did more injections.  She was also prescribed a DJO brace.  R knee swelling: no R knee mechanical symptoms: no Aggravating factors: prolonged walking;  Treatments tried: prior cortisone injection Aug 2022 and w. Alvan Dame too; knee brace; cushioned shoes  Diagnostic testing: R knee XR at [facility]- Feb 2023;R knee MRI at [facility] - Sept 2022;   Pertinent review of Systems: No fevers or chills  Relevant historical information: PAD.  CAD.  Renal insufficiency.  History of meningioma with surgery.  Rheumatoid arthritis or psoriatic arthritis.   Objective:    Vitals:   09/13/21 1042  BP: 120/86  Pulse: 69  SpO2: 95%   General: Well Developed, well nourished, and in no acute distress.   MSK: Right knee: Genu valgus.  Normal motion with crepitation tender palpation medial joint line.   Impression and Recommendations:    Assessment and Plan: 71 y.o. female with right knee pain due to significant DJD.  She has had steroid injections and a knee brace which is helped.  Ultimately I do think she will benefit from a knee replacement.  For now we will add physical therapy which can help.  Additionally will authorize hyaluronic acid injections which could help as well.  She may benefit from a off loader brace as well however I would like to see the MRI report that she had to be more orthopedics before we make a determination on that.[name]  PDMP not reviewed this encounter. Orders Placed This Encounter  Procedures   Ambulatory referral to Physical Therapy    Referral  Priority:   Routine    Referral Type:   Physical Medicine    Referral Reason:   Specialty Services Required    Requested Specialty:   Physical Therapy    Number of Visits Requested:   1   No orders of the defined types were placed in this encounter.   Discussed warning signs or symptoms. Please see discharge instructions. Patient expresses understanding.   The above documentation has been reviewed and is accurate and complete Lynne Leader, M.D.

## 2021-09-13 ENCOUNTER — Ambulatory Visit (INDEPENDENT_AMBULATORY_CARE_PROVIDER_SITE_OTHER): Payer: Medicare Other | Admitting: Family Medicine

## 2021-09-13 ENCOUNTER — Ambulatory Visit: Payer: Self-pay

## 2021-09-13 ENCOUNTER — Encounter: Payer: Self-pay | Admitting: Family Medicine

## 2021-09-13 VITALS — BP 120/86 | HR 69 | Ht 67.0 in | Wt 217.0 lb

## 2021-09-13 DIAGNOSIS — I25118 Atherosclerotic heart disease of native coronary artery with other forms of angina pectoris: Secondary | ICD-10-CM | POA: Diagnosis not present

## 2021-09-13 DIAGNOSIS — G8929 Other chronic pain: Secondary | ICD-10-CM | POA: Diagnosis not present

## 2021-09-13 DIAGNOSIS — M25561 Pain in right knee: Secondary | ICD-10-CM

## 2021-09-13 DIAGNOSIS — M1711 Unilateral primary osteoarthritis, right knee: Secondary | ICD-10-CM | POA: Diagnosis not present

## 2021-09-13 NOTE — Patient Instructions (Addendum)
Good to see you today.  PT to Pamela Lowe.  Their office will call you to schedule but please let us know if you don't hear from them in one week regarding scheduling.  We will work on getting authorization for the gel injections and someone from our office will call you once we hear back from your insurance.  Follow-up: for gel injections

## 2021-09-14 ENCOUNTER — Ambulatory Visit (INDEPENDENT_AMBULATORY_CARE_PROVIDER_SITE_OTHER): Payer: Medicare Other | Admitting: Family Medicine

## 2021-09-14 ENCOUNTER — Encounter: Payer: Self-pay | Admitting: Family Medicine

## 2021-09-14 ENCOUNTER — Other Ambulatory Visit: Payer: Self-pay

## 2021-09-14 VITALS — BP 116/68 | HR 80 | Temp 98.2°F | Resp 18 | Ht 67.0 in | Wt 216.0 lb

## 2021-09-14 DIAGNOSIS — R233 Spontaneous ecchymoses: Secondary | ICD-10-CM | POA: Diagnosis not present

## 2021-09-14 DIAGNOSIS — I25118 Atherosclerotic heart disease of native coronary artery with other forms of angina pectoris: Secondary | ICD-10-CM

## 2021-09-14 LAB — CBC
HCT: 40.4 % (ref 36.0–46.0)
Hemoglobin: 13 g/dL (ref 12.0–15.0)
MCHC: 32.2 g/dL (ref 30.0–36.0)
MCV: 93.7 fl (ref 78.0–100.0)
Platelets: 297 10*3/uL (ref 150.0–400.0)
RBC: 4.31 Mil/uL (ref 3.87–5.11)
RDW: 14.9 % (ref 11.5–15.5)
WBC: 5.7 10*3/uL (ref 4.0–10.5)

## 2021-09-14 NOTE — Progress Notes (Signed)
Subjective:  Patient ID: Pamela Lowe, female    DOB: [birth year]  Age: 71 y.o. MRN: [id number]  CC:  Chief Complaint  Patient presents with   Bleeding/Bruising    Patient states she has been getting some bruising easily on arms and she has no idea how. She would like a referral.    HPI Pamela Lowe presents for   Easy bruising: Noticed recently on the arms, seems to be bruising easier than in the past. Bruise on left flank last week, NKI, but small abrasion noted. Getting better- fading.  2 bruises on left arm noted yesterday. No injury. No known injury.  Gum bleeding at times in past with Waterpik. No new bleeding or changes. Min bleeding with brushing teeth at times.  No hematuria or vaginal bleeding.  Occasional blood on tissue in nose, no epistaxis.  No melena, hematochezia.  No fever, night sweats, or unexplained wt loss   History of CAD, takes Plavix 26m,  aspirin 863mqd past few years. No additional aspirin products.   History Patient Active Problem List   Diagnosis Date Noted   Spondylosis without myelopathy 08/03/2021   Rheumatoid arthritis with rheumatoid factor ([facility]05/18/2023   Psoriasis 08/03/2021   Hypopituitarism ([facility]05/18/2023   Adrenal insufficiency ([facility]05/18/2023   Gastroesophageal reflux disease 07/26/2021   Meningioma ([facility]/05/2021   Visual field defect 03/21/2021   Chronic left shoulder pain 03/21/2021   Cervical radiculopathy 03/21/2021   History of pituitary surgery 03/06/2021   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   Pseudophakia of both eyes 06/29/2019   Steroid responders to glaucoma of both eyes 06/29/2019   S/P coronary artery stent placement    PAD (peripheral artery disease) (HCC)    CAD (coronary artery disease) 12/26/2018   Angina pectoris ([facility]   Primary open-angle glaucoma 10/28/2018   Glaucoma associated with ocular inflammation 10/28/2018   Uveitic glaucoma of right eye, severe stage 10/28/2018   Coronary artery  disease of native artery of transplanted heart with stable angina pectoris (HC[place]06/06/2018   Aortic atherosclerosis (HCDe Witt06/04/2018   Uveitis 05/05/2018   Goiter 05/05/2018   Mixed hyperlipidemia 05/05/2018   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   Genetic testing 02/07/2018   Family history of kidney cancer 01/15/2018   Family history of pancreatic cancer    Family history of breast cancer    Family history of uterine cancer    Family history of colon cancer    Family history of prostate cancer    Family history of lung cancer    Hypertensive retinopathy of both eyes 11/12/2017   Iritis of right eye 11/12/2017   Nuclear sclerotic cataract of left eye 11/12/2017   Ocular hypertension of right eye 11/12/2017   Inflammation of eye, right 08/07/2017   Memory loss 09/21/2016   Essential hypertension 09/21/2016   Low vitamin D level 09/21/2016   DOE (dyspnea on exertion) 11/08/2010   Past Medical History:  Diagnosis Date   Allergic rhinitis    Allergy    Arthritis    Brain tumor (HCMounds   Breast cyst 2007   Right   Cataract    surgery 07/2017   Clotting disorder (HCTroy   On plavix since stents  for artery blockages   Depression 2006   w/ menopause   Family history of breast cancer    Family history of colon cancer    Family history of kidney cancer 01/15/2018   Family history of lung cancer    Family  history of pancreatic cancer    Family history of prostate cancer    Family history of uterine cancer    GERD (gastroesophageal reflux disease)    Glaucoma    Heart murmur    History of hysterectomy, supracervical    Hyperlipidemia    Hypertension    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   MLH1 [id number] (Splice site)   Multinodular goiter    PAD (peripheral artery disease) ([place])    LE Art [place] 1/18: Occluded prox-mid R SFA // ABIs 9/22: R 0.83; L 1.0   Pustular psoriasis    Tobacco user    Past Surgical History:  Procedure Laterality Date   ABDOMINAL HYSTERECTOMY  1995    BRAIN TUMOR EXCISION  02/24/2021   at [place]     CATARACT EXTRACTION Right 07/2017   COLONOSCOPY     CORONARY STENT INTERVENTION N/A 12/26/2018   Procedure: CORONARY STENT INTERVENTION;  Surgeon: Belva Crome, MD;  Location: [facility];  Service: Cardiovascular;  Laterality: N/A;   EYE SURGERY  2019, 2020   Cataracts, eye shunt implants   LEFT HEART CATH AND CORONARY ANGIOGRAPHY N/A 12/17/2018   Procedure: LEFT HEART CATH AND CORONARY ANGIOGRAPHY;  Surgeon: Belva Crome, MD;  Location: [facility];  Service: Cardiovascular;  Laterality: N/A;   SUPRACERVICAL ABDOMINAL HYSTERECTOMY  1986   h/o supracervical hysterectomy   UPPER GASTROINTESTINAL ENDOSCOPY     Allergies  Allergen Reactions   Sulfamethoxazole-Trimethoprim Other (See Comments) and Rash    Chills/achy Flu like symptoms    Conjugated Estrogens     Other reaction(s): rash   Wellbutrin [Bupropion Hcl] Rash    rash   Prior to Admission medications   Medication Sig Start Date End Date Taking? Authorizing Provider  AMBULATORY NON FORMULARY MEDICATION Apply 1 drop topically at bedtime. Terbinafine DMSO 3.34% susp 06/15/21  Yes [provider]  Ascorbic Acid (VITAMIN C) 500 MG CAPS Take 1 tablet by mouth daily.   Yes [provider]  aspirin 81 MG EC tablet Take 81 mg by mouth every evening.    Yes [provider]  B Complex Vitamins (VITAMIN B COMPLEX) TABS Take 1 tablet by mouth daily.   Yes [provider]  benazepril (LOTENSIN) 20 MG tablet Take 1 tablet (20 mg total) by mouth daily. 02/24/21  Yes Belva Crome, MD  Biotin 1000 MCG tablet Take 1 tablet by mouth daily.   Yes [provider]  brimonidine (ALPHAGAN) 0.2 % ophthalmic solution Place 1 drop into both eyes 3 (three) times daily.  11/20/18  Yes [provider]  Calcium Carb-Cholecalciferol (CALCIUM-VITAMIN D) 600-400 MG-UNIT TABS Take 1 tablet  by mouth daily.   Yes [provider]  clopidogrel (PLAVIX) 75 MG tablet Take 1 tablet (75 mg total) by mouth daily. 03/14/21  Yes Belva Crome, MD  dorzolamide-timolol (COSOPT) 22.3-6.8 MG/ML ophthalmic solution Place 1 drop into the right eye 2 (two) times daily. 06/29/19  Yes [provider]  ezetimibe (ZETIA) 10 MG tablet Take 1 tablet (10 mg total) by mouth daily. 02/24/21  Yes Belva Crome, MD  fexofenadine (ALLEGRA) 60 MG tablet Take 60 mg by mouth as needed for allergies or rhinitis.   Yes [provider]  folic acid (FOLVITE) 1 MG tablet Take 1 mg by mouth daily.    Yes [provider]  hydrochlorothiazide (MICROZIDE) 12.5 MG capsule Take 1 capsule (12.5 mg  total) by mouth every Monday, Wednesday, and Friday. 03/15/21  Yes Belva Crome, MD  hydrocortisone (CORTEF) 10 MG tablet Take 10 mg by mouth daily. 07/08/21  Yes [provider]  hyoscyamine (LEVSIN SL) 0.125 MG SL tablet DISSOLVE 1 TO 2 TABLETS IN MOUTH UNDER THE TONGUE TWICE DAILY AS NEEDED 04/19/21  Yes Milus Banister, MD  ipratropium (ATROVENT) 0.06 % nasal spray Place 2 sprays into both nostrils 3 (three) times daily. 12/03/19  Yes [provider]  methotrexate (RHEUMATREX) 2.5 MG tablet Take by mouth once a week. Mondays Take 6 tablets   Yes [provider]  metoprolol succinate (TOPROL-XL) 25 MG 24 hr tablet Take 1 tablet by mouth once daily 03/21/21  Yes Belva Crome, MD  modafinil (PROVIGIL) 200 MG tablet Take 1 tablet (200 mg total) by mouth daily. 09/04/21  Yes Sater, Nanine Means, MD  Multiple Vitamins-Minerals (MULTIVITAMIN WITH MINERALS) tablet Take 1 tablet by mouth daily.   Yes [provider]  Omega-3 Fatty Acids (FISH OIL) 1000 MG CAPS Take 1 capsule by mouth daily.    Yes [provider]  pantoprazole (PROTONIX) 40 MG tablet Take 1 tablet (40 mg total) by mouth daily. 07/26/21  Yes Zehr, Janett Billow D, PA-C  rosuvastatin (CRESTOR) 20 MG tablet  TAKE ONE TABLET BY MOUTH DAILY 12/06/20  Yes Belva Crome, MD  [facility] ARIA 50 MG/4ML SOLN injection Inject 50 mg into the vein. Every 60 Days 07/16/19  Yes [provider]  Specialty Vitamins Products (COLLAGEN ULTRA) CAPS Take 1 capsule by mouth daily.   Yes [provider]   Social History   Socioeconomic History   Marital status: Married    Spouse name: [name]   Number of children: 0   Years of education: Not on file   Highest education level: Master's degree (e.g., MA, MS, MEng, MEd, MSW, MBA)  Occupational History   Occupation: Special Ed Pharmacist, hospital    Comment: Engineer, manufacturing  Tobacco Use   Smoking status: Former    Packs/day: 0.25    Years: 36.00    Total pack years: 9.00    Types: Cigarettes    Quit date: 08/01/2017    Years since quitting: 4.1   Smokeless tobacco: Never   Tobacco comments:    Started smoking at about 71 yrs old  Vaping Use   Vaping Use: Never used  Substance and Sexual Activity   Alcohol use: Not Currently   Drug use: No   Sexual activity: Not Currently    Partners: Male    Birth control/protection: Post-menopausal, Surgical    Comment: Hysterectomy  Other Topics Concern   Not on file  Social History Narrative   Not on file   Social Determinants of Health   Financial Resource Strain: Low Risk  (08/03/2021)   Overall Financial Resource Strain (CARDIA)    Difficulty of Paying Living Expenses: Not hard at all  Food Insecurity: No Food Insecurity (08/03/2021)   Hunger Vital Sign    Worried About Running Out of Food in the Last Year: Never true    [place] in the Last Year: Never true  Transportation Needs: No Transportation Needs (08/03/2021)   PRAPARE - Hydrologist (Medical): No    Lack of Transportation (Non-Medical): No  Physical Activity: Sufficiently Active (08/03/2021)   Exercise Vital Sign    Days of Exercise per Week: 6 days    Minutes of Exercise per Session: 30 min  Stress: No Stress  Concern Present (08/03/2021)   [place]    Feeling of Stress : Not at all  Social Connections: Moderately Integrated (08/03/2021)   Social Connection and Isolation Panel [NHANES]    Frequency of Communication with Friends and Family: More than three times a week    Frequency of Social Gatherings with Friends and Family: More than three times a week    Attends Religious Services: More than 4 times per year    Active Member of [employer] or Organizations: No    Attends Archivist Meetings: Never    Marital Status: Married  Human resources officer Violence: Not At Risk (08/03/2021)   Humiliation, Afraid, Rape, and Kick questionnaire    Fear of Current or Ex-Partner: No    Emotionally Abused: No    Physically Abused: No    Sexually Abused: No    Review of Systems   Objective:   Vitals:   09/14/21 1330  BP: 116/68  Pulse: 80  Resp: 18  Temp: 98.2 F (36.8 C)  TempSrc: Temporal  SpO2: 97%  Weight: 216 lb (98 kg)  Height: 5' 7" (1.702 m)     Physical Exam Constitutional:      General: She is not in acute distress.    Appearance: Normal appearance. She is not ill-appearing, toxic-appearing or diaphoretic.  HENT:     Nose: Nose normal. No rhinorrhea.     Comments: No bleeding or crusted blood noted    Mouth/Throat:     Mouth: Mucous membranes are moist.     Comments: No bleeding of gums appreciated. Abdominal:     General: Abdomen is flat. There is no distension.     Palpations: Abdomen is soft.     Tenderness: There is no abdominal tenderness.     Comments: No apparent hepatosplenomegaly, nontender  Skin:    General: Skin is dry.     Findings: Bruising present. No rash.     Comments: Small area of healing ecchymosis on the right flank with small appears to be abrasion superiorly.  Left upper arm with 2 ecchymotic areas, no break in skin or apparent wounds.  No other bruising noted  Neurological:      Mental Status: She is alert.           Assessment & Plan:  Pamela Lowe is a 71 y.o. female . Easy bruising - Plan: Protime-INR, CBC Likely in part due to use of aspirin plus Plavix.  Possible abrasion on the right flank, unknown injuries on the left upper arm.  Slight bleeding of gums with Waterpik or brushing on occasion again may be due to use of aspirin.  -Check CBC, PT/INR.  Depending on results can consider hematology eval.  RTC/ER precautions if acute worsening.  No orders of the defined types were placed in this encounter.  Patient Instructions      If you have lab work done today you will be contacted with your lab results within the next 2 weeks.  If you have not heard from [place] then please contact us. The fastest way to get your results is to register for My Chart.   IF you received an x-ray today, you will receive an invoice from [facility] Radiology. Please contact [facility] at [phone] with questions or concerns regarding your invoice.   IF you received labwork today, you will receive an invoice from [facility]. Please contact [facility] at [phone] with questions or concerns regarding  your invoice.   Our billing staff will not be able to assist you with questions regarding bills from these companies.  You will be contacted with the lab results as soon as they are available. The fastest way to get your results is to activate your My Chart account. Instructions are located on the last page of this paperwork. If you have not heard from [place] regarding the results in 2 weeks, please contact this office.        Signed,   Merri Ray, MD [place] 09/14/21 2:02 PM

## 2021-09-14 NOTE — Patient Instructions (Addendum)
  Easy bruising is not unlikely with use of medications like aspirin and Plavix.  I will check your blood counts and a blood clotting test today, and depending on results could consider meeting with hematology.  Let me know if any new or worsening symptoms.  Return to the clinic or go to the nearest emergency room if any of your symptoms worsen or new symptoms occur.    If you have lab work done today you will be contacted with your lab results within the next 2 weeks.  If you have not heard from [place] then please contact us. The fastest way to get your results is to register for My Chart.   IF you received an x-ray today, you will receive an invoice from [facility]iology. Please contact [facility] at [phone] with questions or concerns regarding your invoice.   IF you received labwork today, you will receive an invoice from [facility]. Please contact [facility] at [phone] with questions or concerns regarding your invoice.   Our billing staff will not be able to assist you with questions regarding bills from these companies.  You will be contacted with the lab results as soon as they are available. The fastest way to get your results is to activate your My Chart account. Instructions are located on the last page of this paperwork. If you have not heard from [place] regarding the results in 2 weeks, please contact this office.

## 2021-09-25 DIAGNOSIS — L409 Psoriasis, unspecified: Secondary | ICD-10-CM | POA: Diagnosis not present

## 2021-09-25 DIAGNOSIS — M1990 Unspecified osteoarthritis, unspecified site: Secondary | ICD-10-CM | POA: Diagnosis not present

## 2021-09-25 DIAGNOSIS — M5137 Other intervertebral disc degeneration, lumbosacral region: Secondary | ICD-10-CM | POA: Diagnosis not present

## 2021-09-25 DIAGNOSIS — M25569 Pain in unspecified knee: Secondary | ICD-10-CM | POA: Diagnosis not present

## 2021-09-25 DIAGNOSIS — M47819 Spondylosis without myelopathy or radiculopathy, site unspecified: Secondary | ICD-10-CM | POA: Diagnosis not present

## 2021-09-25 DIAGNOSIS — E669 Obesity, unspecified: Secondary | ICD-10-CM | POA: Diagnosis not present

## 2021-09-25 DIAGNOSIS — Z79899 Other long term (current) drug therapy: Secondary | ICD-10-CM | POA: Diagnosis not present

## 2021-09-26 ENCOUNTER — Encounter: Payer: Self-pay | Admitting: Family Medicine

## 2021-09-26 DIAGNOSIS — Z0289 Encounter for other administrative examinations: Secondary | ICD-10-CM

## 2021-09-27 ENCOUNTER — Ambulatory Visit (INDEPENDENT_AMBULATORY_CARE_PROVIDER_SITE_OTHER): Payer: Medicare Other | Admitting: Bariatrics

## 2021-09-27 ENCOUNTER — Encounter (INDEPENDENT_AMBULATORY_CARE_PROVIDER_SITE_OTHER): Payer: Self-pay | Admitting: Bariatrics

## 2021-09-27 VITALS — BP 113/77 | HR 61 | Temp 98.2°F | Ht 66.0 in | Wt 213.0 lb

## 2021-09-27 DIAGNOSIS — E782 Mixed hyperlipidemia: Secondary | ICD-10-CM | POA: Diagnosis not present

## 2021-09-27 DIAGNOSIS — Z0000 Encounter for general adult medical examination without abnormal findings: Secondary | ICD-10-CM

## 2021-09-27 DIAGNOSIS — R5383 Other fatigue: Secondary | ICD-10-CM

## 2021-09-27 DIAGNOSIS — I700 Atherosclerosis of aorta: Secondary | ICD-10-CM | POA: Diagnosis not present

## 2021-09-27 DIAGNOSIS — R7309 Other abnormal glucose: Secondary | ICD-10-CM | POA: Diagnosis not present

## 2021-09-27 DIAGNOSIS — I10 Essential (primary) hypertension: Secondary | ICD-10-CM | POA: Diagnosis not present

## 2021-09-27 DIAGNOSIS — Z1331 Encounter for screening for depression: Secondary | ICD-10-CM

## 2021-09-27 DIAGNOSIS — Z6834 Body mass index (BMI) 34.0-34.9, adult: Secondary | ICD-10-CM | POA: Diagnosis not present

## 2021-09-27 DIAGNOSIS — R0602 Shortness of breath: Secondary | ICD-10-CM

## 2021-09-27 DIAGNOSIS — K219 Gastro-esophageal reflux disease without esophagitis: Secondary | ICD-10-CM | POA: Diagnosis not present

## 2021-09-27 DIAGNOSIS — M057A Rheumatoid arthritis with rheumatoid factor of other specified site without organ or systems involvement: Secondary | ICD-10-CM | POA: Diagnosis not present

## 2021-09-27 DIAGNOSIS — E669 Obesity, unspecified: Secondary | ICD-10-CM | POA: Insufficient documentation

## 2021-09-27 DIAGNOSIS — E559 Vitamin D deficiency, unspecified: Secondary | ICD-10-CM

## 2021-09-27 DIAGNOSIS — E668 Other obesity: Secondary | ICD-10-CM

## 2021-09-27 HISTORY — DX: Encounter for screening for depression: Z13.31

## 2021-09-27 NOTE — Therapy (Signed)
OUTPATIENT PHYSICAL THERAPY LOWER EXTREMITY EVALUATION   Patient Name: Pamela Lowe MRN: [id number] DOB:[birth year], 71 y.o., female Today's Date: 09/28/2021   PT End of Session - 09/28/21 1340     Visit Number 1    Number of Visits 12    Date for PT Re-Evaluation 11/09/21    Authorization Type medicare, BCBS supplement    Progress Note Due on Visit 10    PT Start Time 1345    PT Stop Time 1431    PT Time Calculation (min) 46 min             Past Medical History:  Diagnosis Date   Allergic rhinitis    Allergy    Arthritis    Brain tumor (HCC)    Breast cyst 2007   Right   Cataract    surgery 07/2017   Clotting disorder (HCC)    On plavix since stents  for artery blockages   Depression 2006   w/ menopause   Family history of breast cancer    Family history of colon cancer    Family history of kidney cancer 01/15/2018   Family history of lung cancer    Family history of pancreatic cancer    Family history of prostate cancer    Family history of uterine cancer    GERD (gastroesophageal reflux disease)    Glaucoma    Heart murmur    History of hysterectomy, supracervical    Hyperlipidemia    Hypertension    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   MLH1 [id number] (Splice site)   Multinodular goiter    PAD (peripheral artery disease) (HCC)    LE Art [place] 1/18: Occluded prox-mid R SFA // ABIs 9/22: R 0.83; L 1.0   Pustular psoriasis    Tobacco user    Past Surgical History:  Procedure Laterality Date   ABDOMINAL HYSTERECTOMY  1995   BRAIN TUMOR EXCISION  02/24/2021   at [facility]   BREAST BIOPSY Bilateral 1985   CARDIAC CATHETERIZATION     CATARACT EXTRACTION Right 07/2017   COLONOSCOPY     CORONARY STENT INTERVENTION N/A 12/26/2018   Procedure: CORONARY STENT INTERVENTION;  Surgeon: Lyn Records, MD;  Location: [facility];  Service: Cardiovascular;  Laterality: N/A;   EYE SURGERY  2019, 2020   Cataracts, eye shunt implants   LEFT HEART CATH AND  CORONARY ANGIOGRAPHY N/A 12/17/2018   Procedure: LEFT HEART CATH AND CORONARY ANGIOGRAPHY;  Surgeon: Lyn Records, MD;  Location: [facility];  Service: Cardiovascular;  Laterality: N/A;   SUPRACERVICAL ABDOMINAL HYSTERECTOMY  1986   h/o supracervical hysterectomy   UPPER GASTROINTESTINAL ENDOSCOPY     Patient Active Problem List   Diagnosis Date Noted   Osteoarthritis 09/28/2021   Degeneration of lumbosacral intervertebral disc 09/28/2021   Elevated ALT measurement 09/28/2021   Other fatigue 09/27/2021   SOB (shortness of breath) on exertion 09/27/2021   Elevated glucose 09/27/2021   Health care maintenance 09/27/2021   Vitamin D deficiency 09/27/2021   Depression screening 09/27/2021   Class 1 obesity with serious comorbidity and body mass index (BMI) of 34.0 to 34.9 in adult 09/27/2021   Spondylosis without myelopathy 08/03/2021   Rheumatoid arthritis with rheumatoid factor (HCC) 08/03/2021   Psoriasis 08/03/2021   Hypopituitarism (HCC) 08/03/2021   Adrenal insufficiency (HCC) 08/03/2021   Gastroesophageal reflux disease 07/26/2021   Meningioma (HCC) 03/21/2021   Visual field defect 03/21/2021   Chronic left shoulder pain 03/21/2021   Cervical  radiculopathy 03/21/2021   History of pituitary surgery 03/06/2021   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   Pseudophakia of both eyes 06/29/2019   Steroid responders to glaucoma of both eyes 06/29/2019   S/P coronary artery stent placement    PAD (peripheral artery disease) (HCC)    CAD (coronary artery disease) 12/26/2018   Angina pectoris (HCC)    Primary open-angle glaucoma 10/28/2018   Glaucoma associated with ocular inflammation 10/28/2018   Uveitic glaucoma of right eye, severe stage 10/28/2018   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) 08/21/2018   Aortic atherosclerosis (HCC) 08/19/2018   Uveitis 05/05/2018   Goiter 05/05/2018   Mixed hyperlipidemia 05/05/2018    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   Genetic testing 02/07/2018   Family history of kidney cancer 01/15/2018   Family history of pancreatic cancer    Family history of breast cancer    Family history of uterine cancer    Family history of colon cancer    Family history of prostate cancer    Family history of lung cancer    Hypertensive retinopathy of both eyes 11/12/2017   Iritis of right eye 11/12/2017   Nuclear sclerotic cataract of left eye 11/12/2017   Ocular hypertension of right eye 11/12/2017   Inflammation of eye, right 08/07/2017   Memory loss 09/21/2016   Essential hypertension 09/21/2016   Low vitamin D level 09/21/2016   DOE (dyspnea on exertion) 11/08/2010    PCP: Shade Flood, MD  REFERRING PROVIDER: Rodolph Bong, MD  REFERRING DIAG: [phone] (ICD-10-CM) - Chronic pain of right knee M17.11 (ICD-10-CM) - Primary osteoarthritis of right knee  THERAPY DIAG:  Chronic pain of right knee - Plan: PT plan of care cert/re-cert  Muscle weakness (generalized) - Plan: PT plan of care cert/re-cert  Difficulty in walking, not elsewhere classified - Plan: PT plan of care cert/re-cert  Rationale for Evaluation and Treatment Rehabilitation  ONSET DATE: August 2023  SUBJECTIVE:   SUBJECTIVE STATEMENT: Last august she tore the meniscus in her right knee. States that her MD recommend a knee replacement. States she already had a brain tumor that needed to be removed and she removed in December. States that she tried PT but it didn't help. States that she then saw Dr. Thurston Hole and he gave her a shot and she has been wearing a brace since. States that she now can walk with the injection and the brace. States that she was out walking and was rec to see Dr. Denyse Amass and they are starting the gel injections next Tuesday. States that she was able to walk [place] which is the first time in 2 years. States she would like to get her knees stronger.    Patient reports she originally tore her meniscus when she rolled over in bed trying to do an exercise.  PERTINENT HISTORY: clotting issuses - on blood thinners, cardiac disease, history of meningioma.   PAIN:  Are you having pain? Yes: NPRS scale: 5/10 Pain location: right knee medial Pain description: sharp, grabbing Aggravating factors: walking, standing, fast walking  Relieving factors: rest, brace, bands    WEIGHT BEARING RESTRICTIONS No  FALLS:  Has patient fallen in last 6 months? No   OCCUPATION: not currently working  PLOF: Independent  PATIENT GOALS to have less pain and to get more active.    OBJECTIVE:   DIAGNOSTIC FINDINGS: previous imaging of knee demonstrated torn meniscus per patient  COGNITION:  Overall cognitive status: Within functional limits for tasks assessed     SENSATION: WFL  EDEMA:  Circumferential: left 37.0 cm, Right 38.5cm    PALPATION: Swelling throughout right leg, increased resting muscle tension in right leg, patella hypermobile laterally/medially but positioned superiorly    LE Measurements Lower Extremity Right 09/28/2021 Left 09/28/2021   A/PROM MMT A/PROM MMT  Hip Flexion  4  4  Hip Extension      Hip Abduction      Hip Adduction      Hip Internal rotation      Hip External rotation      Knee Flexion 130 4- 135 4-  Knee Extension 0 4 4 (hyper) 4  Ankle Dorsiflexion  5  5  Ankle Plantarflexion      Ankle Inversion      Ankle Eversion       (Blank rows = not tested)  * pain    GAIT Comments: with brace wears brace, limited knee extension, foot flat gait    TODAY'S TREATMENT: 09/28/2021 Therapeutic Exercise:  Aerobic: Supine:thomas stretch x3 30" holds R Prone:  Seated: self STM with tiger tail 5 minutes  Standing: Neuromuscular Re-education: Manual Therapy: Therapeutic Activity: Self Care: Trigger Point Dry Needling:  Modalities:    PATIENT EDUCATION:  Education details: on current  presentation, on HEP, on clinical outcomes score and POC, on findings, signs and symptoms and what they correlate with  Person educated: Patient Education method: Explanation, Demonstration, and Handouts Education comprehension: verbalized understanding   HOME EXERCISE PROGRAM: [id number]  ASSESSMENT:  CLINICAL IMPRESSION: Patient is a 71 y.o. female who was seen today for physical therapy evaluation and treatment for right knee pain that primarily hurts when the bone "shifts out of place." Patient presents with swelling, weakness and instability in right knee. Educated patient on signs and symptoms more consistent with patella instability vs. Meniscal tear but how both could be contributing to current presentation. Patient would greatly benefit from skilled PT to improve overall function and QOL.    OBJECTIVE IMPAIRMENTS Abnormal gait, decreased activity tolerance, decreased balance, decreased knowledge of use of DME, difficulty walking, decreased ROM, decreased strength, increased edema, and pain.   ACTIVITY LIMITATIONS lifting, standing, squatting, stairs, transfers, and locomotion level  PARTICIPATION LIMITATIONS: cleaning and community activity  PERSONAL FACTORS Age are also affecting patient's functional outcome.   REHAB POTENTIAL: Good  CLINICAL DECISION MAKING: Stable/uncomplicated  EVALUATION COMPLEXITY: Low  GOALS: Goals reviewed with patient?  yes  SHORT TERM GOALS:  Patient will be independent in self management strategies to improve quality of life and functional outcomes. Baseline: new program Target date: 10/19/2021 Goal status: INITIAL  2.  Patient will report at least 50% improvement in overall symptoms and/or function to demonstrate improved functional mobility Baseline: 0% Target date: 10/19/2021 Goal status: INITIAL  3.  Patient will demonstrate swelling in legs within .5cm of each other to demonstrate reduced swelling. Baseline: see above Target date:  10/19/2021 Goal status: INITIAL     LONG TERM GOALS:  Patient will report at least 75% improvement in overall symptoms and/or function to demonstrate improved functional mobility Baseline: 0% Target date: 11/09/2021 Goal status: INITIAL  2.  Patient will be able to walk 1-2 miles without pain during or after to improve ability to walk her dog Baseline: unable Target date: 11/09/2021 Goal status: INITIAL  3.  Patent will demonstrate 4+/5 MMT in LE to demonstrate improved strength. Baseline: see above Target date: 11/09/2021 Goal status:  INITIAL      PLAN: PT FREQUENCY: 2x/week  PT DURATION: 6 weeks  PLANNED INTERVENTIONS: Therapeutic exercises, Therapeutic activity, Neuromuscular re-education, Balance training, Gait training, Patient/Family education, Self Care, Joint mobilization, Joint manipulation, Stair training, Vestibular training, Orthotic/Fit training, DME instructions, Aquatic Therapy, Dry Needling, Electrical stimulation, Cryotherapy, Moist heat, Traction, Ultrasound, Ionotophoresis 4mg /ml Dexamethasone, and Manual therapy  PLAN FOR NEXT SESSION: assess hip strength- strengthen hip/LE, STM as indicated  3:09 PM, 09/28/21 Tereasa Coop, DPT Physical Therapy with [facility]

## 2021-09-27 NOTE — Progress Notes (Signed)
Chief Complaint:   OBESITY Pamela Lowe (MR# [id number]) is a 71 y.o. female who presents for evaluation and treatment of obesity and related comorbidities. Current BMI is Body mass index is 34.38 kg/m. Pamela Lowe has been struggling with her weight for many years and has been unsuccessful in either losing weight, maintaining weight loss, or reaching her healthy weight goal.  Pamela Lowe states that she craves sweets.  She states that she does not like to cook.  She states that she snacks in the evening.  Pamela Lowe is currently in the action stage of change and ready to dedicate time achieving and maintaining a healthier weight. Pamela Lowe is interested in becoming our patient and working on intensive lifestyle modifications including (but not limited to) diet and exercise for weight loss.  Pamela Lowe's habits were reviewed today and are as follows: she thinks her family will eat healthier with her, she has significant food cravings issues, she snacks frequently in the evenings, she skips meals frequently, she frequently makes poor food choices, she has problems with excessive hunger, and she frequently eats larger portions than normal.  Depression Screen Pamela Lowe's Food and Mood (modified PHQ-9) score was 5.     09/27/2021    7:03 AM  Depression screen PHQ 2/9  Decreased Interest 3  Down, Depressed, Hopeless 1  PHQ - 2 Score 4  Altered sleeping 0  Tired, decreased energy 0  Change in appetite 1  Feeling bad or failure about yourself  0  Trouble concentrating 0  Moving slowly or fidgety/restless 0  Suicidal thoughts 0  PHQ-9 Score 5  Difficult doing work/chores Not difficult at all   Subjective:   1. Other fatigue Pamela Lowe admits to daytime somnolence and admits to waking up still tired. Patient has a history of symptoms of daytime fatigue and morning fatigue. Pamela Lowe generally gets 6 or 7 hours of sleep per night, and states that she has generally restful sleep. Snoring is not present. Apneic  episodes are not present. Epworth Sleepiness Score is 5.   2. SOB (shortness of breath) on exertion Pamela Lowe notes increasing shortness of breath with exercising and seems to be worsening over time with weight gain. She notes getting out of breath sooner with activity than she used to. This has not gotten worse recently. Pamela Lowe denies shortness of breath at rest or orthopnea.  3. Mixed hyperlipidemia Pamela Lowe is taking Zetia and rosuvastatin.  4. Essential hypertension Pamela Lowe is taking hydrochlorothiazide and benazepril.  Her blood pressure is controlled.  5. Rheumatoid arthritis of other site with positive rheumatoid factor (HCC) Pamela Lowe sees a rheumatologist.  6. Gastroesophageal reflux disease, unspecified whether esophagitis present Pamela Lowe is taking Protonix.  7. Aortic atherosclerosis (Pamela Lowe) Pamela Lowe's PCP is managing.  8. Elevated glucose Pamela Lowe is not on medications currently.  9. Vitamin D deficiency Pamela Lowe is not on vitamin D currently.  10. Health care maintenance Pamela Lowe is due for labs.  Assessment/Plan:   1. Other fatigue Pamela Lowe does feel that her weight is causing her energy to be lower than it should be. Fatigue may be related to obesity, depression or many other causes. Labs will be ordered, and in the meanwhile, Pamela Lowe will focus on self care including making healthy food choices, increasing physical activity and focusing on stress reduction.  - EKG 12-Lead - TSH+T4F+T3Free  2. SOB (shortness of breath) on exertion Pamela Lowe does feel that she gets out of breath more easily that she used to when she exercises. Pamela Lowe's shortness of breath appears to  be obesity related and exercise induced. She has agreed to work on weight loss and gradually increase exercise to treat her exercise induced shortness of breath. Will continue to monitor closely.  3. Mixed hyperlipidemia We will check labs today, Pamela Lowe will continue her medications and we will follow-up at her next  visit.  - Lipid Panel With LDL/HDL Ratio  4. Essential hypertension Pamela Lowe will continue her medications as directed, and we will follow-up at her next visit.  5. Rheumatoid arthritis of other site with positive rheumatoid factor (HCC) We will check labs today.  Pamela Lowe will continue to follow-up with her rheumatologist.  - Comprehensive metabolic panel  6. Gastroesophageal reflux disease, unspecified whether esophagitis present We will check labs today.  Pamela Lowe will continue Protonix, and she will avoid trigger foods.  - Comprehensive metabolic panel  7. Aortic atherosclerosis ([place]) Pamela Lowe will continue to follow-up with her PCP.  8. Elevated glucose We will check labs today, and we will follow-up at Pamela Lowe's next visit.  - Comprehensive metabolic panel - Hemoglobin A1c - Insulin, random  9. Vitamin D deficiency We will check labs today, and we will follow-up at Pamela Lowe's next visit.  - VITAMIN D 25 Hydroxy (Vit-D Deficiency, Fractures)  10. Health care maintenance We will check labs today, and we will follow up at Pamela Lowe's next visit.  - Comprehensive metabolic panel - Hemoglobin A1c - Insulin, random - TSH+T4F+T3Free - VITAMIN D 25 Hydroxy (Vit-D Deficiency, Fractures) - Lipid Panel With LDL/HDL Ratio  11. Depression screening Pamela Lowe had a positive depression screening. Depression is commonly associated with obesity and often results in emotional eating behaviors. We will monitor this closely and work on CBT to help improve the non-hunger eating patterns. Referral to Psychology may be required if no improvement is seen as she continues in our clinic.  12. Class 1 obesity with serious comorbidity and body mass index (BMI) of 34.0 to 34.9 in adult, unspecified obesity type Pamela Lowe is currently in the action stage of change and her goal is to continue with weight loss efforts. I recommend Pamela Lowe begin the structured treatment plan as follows:  She has agreed to the  Category 1 Plan.  Mindful eating and intentional eating were discussed.  Reviewed labs with the patient from 07/26/2021, BMP, 08/10/2021, vitamin D and B12, 09/14/2021, CBC.  Exercise goals: Stationary bike, and some walking.   Behavioral modification strategies: increasing lean protein intake, decreasing simple carbohydrates, increasing vegetables, increasing water intake, decreasing eating out, no skipping meals, meal planning and cooking strategies, keeping healthy foods in the home, and planning for success.  She was informed of the importance of frequent follow-up visits to maximize her success with intensive lifestyle modifications for her multiple health conditions. She was informed we would discuss her lab results at her next visit unless there is a critical issue that needs to be addressed sooner. Seynabou agreed to keep her next visit at the agreed upon time to discuss these results.  Objective:   Blood pressure 113/77, pulse 61, temperature 98.2 F (36.8 C), height '5\' 6"'$  (1.676 m), weight 213 lb (96.6 kg), last menstrual period 03/19/1998, SpO2 99 %. Body mass index is 34.38 kg/m.  EKG: Normal sinus rhythm, rate 57 BPM.  Indirect Calorimeter completed today shows a VO2 of 196 and a REE of 1354.  Her calculated basal metabolic rate is 2426 thus her basal metabolic rate is worse than expected.  General: Cooperative, alert, well developed, in no acute distress. HEENT: Conjunctivae and lids unremarkable. Cardiovascular:  Regular rhythm.  Lungs: Normal work of breathing. Neurologic: No focal deficits.   Lab Results  Component Value Date   CREATININE 1.04 07/26/2021   BUN 21 07/26/2021   NA 139 07/26/2021   K 3.7 07/26/2021   CL 103 07/26/2021   CO2 30 07/26/2021   Lab Results  Component Value Date   ALT 50 (H) 05/29/2021   AST 39 05/29/2021   ALKPHOS 64 05/29/2021   BILITOT 0.7 05/29/2021   Lab Results  Component Value Date   HGBA1C 5.7 07/26/2021   HGBA1C 5.8 (A)  04/05/2021   No results found for: "INSULIN" Lab Results  Component Value Date   TSH 0.43 06/08/2019   Lab Results  Component Value Date   CHOL 153 05/29/2021   HDL 82 05/29/2021   LDLCALC 61 05/29/2021   TRIG 45 05/29/2021   CHOLHDL 1.9 05/29/2021   Lab Results  Component Value Date   WBC 5.7 09/14/2021   HGB 13.0 09/14/2021   HCT 40.4 09/14/2021   MCV 93.7 09/14/2021   PLT 297.0 09/14/2021   No results found for: "IRON", "TIBC", "FERRITIN"  Attestation Statements:   Reviewed by clinician on day of visit: allergies, medications, problem list, medical history, surgical history, family history, social history, and previous encounter notes.   Wilhemena Durie, am acting as Location manager for [employer], DO.  I have reviewed the above documentation for accuracy and completeness, and I agree with the above. Jearld Lesch, DO

## 2021-09-28 ENCOUNTER — Ambulatory Visit (INDEPENDENT_AMBULATORY_CARE_PROVIDER_SITE_OTHER): Payer: Medicare Other | Admitting: Physical Therapy

## 2021-09-28 ENCOUNTER — Encounter: Payer: Self-pay | Admitting: Physical Therapy

## 2021-09-28 ENCOUNTER — Other Ambulatory Visit: Payer: Self-pay | Admitting: Interventional Cardiology

## 2021-09-28 ENCOUNTER — Encounter (INDEPENDENT_AMBULATORY_CARE_PROVIDER_SITE_OTHER): Payer: Self-pay | Admitting: Bariatrics

## 2021-09-28 DIAGNOSIS — G8929 Other chronic pain: Secondary | ICD-10-CM

## 2021-09-28 DIAGNOSIS — M6281 Muscle weakness (generalized): Secondary | ICD-10-CM

## 2021-09-28 DIAGNOSIS — M25561 Pain in right knee: Secondary | ICD-10-CM

## 2021-09-28 DIAGNOSIS — M5137 Other intervertebral disc degeneration, lumbosacral region: Secondary | ICD-10-CM | POA: Insufficient documentation

## 2021-09-28 DIAGNOSIS — R7401 Elevation of levels of liver transaminase levels: Secondary | ICD-10-CM | POA: Insufficient documentation

## 2021-09-28 DIAGNOSIS — M1990 Unspecified osteoarthritis, unspecified site: Secondary | ICD-10-CM | POA: Insufficient documentation

## 2021-09-28 DIAGNOSIS — R262 Difficulty in walking, not elsewhere classified: Secondary | ICD-10-CM | POA: Diagnosis not present

## 2021-09-28 LAB — TSH+T4F+T3FREE
Free T4: 1.19 ng/dL (ref 0.82–1.77)
T3, Free: 3 pg/mL (ref 2.0–4.4)
TSH: 0.823 u[IU]/mL (ref 0.450–4.500)

## 2021-09-28 LAB — COMPREHENSIVE METABOLIC PANEL
ALT: 33 IU/L — ABNORMAL HIGH (ref 0–32)
AST: 36 IU/L (ref 0–40)
Albumin/Globulin Ratio: 1.6 (ref 1.2–2.2)
Albumin: 3.9 g/dL (ref 3.9–4.9)
Alkaline Phosphatase: 65 IU/L (ref 44–121)
BUN/Creatinine Ratio: 13 (ref 12–28)
BUN: 13 mg/dL (ref 8–27)
Bilirubin Total: 0.8 mg/dL (ref 0.0–1.2)
CO2: 24 mmol/L (ref 20–29)
Calcium: 9.7 mg/dL (ref 8.7–10.3)
Chloride: 103 mmol/L (ref 96–106)
Creatinine, Ser: 1.03 mg/dL — ABNORMAL HIGH (ref 0.57–1.00)
Globulin, Total: 2.5 g/dL (ref 1.5–4.5)
Glucose: 103 mg/dL — ABNORMAL HIGH (ref 70–99)
Potassium: 4.2 mmol/L (ref 3.5–5.2)
Sodium: 140 mmol/L (ref 134–144)
Total Protein: 6.4 g/dL (ref 6.0–8.5)
eGFR: 58 mL/min/{1.73_m2} — ABNORMAL LOW (ref >59)

## 2021-09-28 LAB — HEMOGLOBIN A1C
Est. average glucose Bld gHb Est-mCnc: 114 mg/dL
Hgb A1c MFr Bld: 5.6 % (ref 4.8–5.6)

## 2021-09-28 LAB — LIPID PANEL WITH LDL/HDL RATIO
Cholesterol, Total: 148 mg/dL (ref 100–199)
HDL: 83 mg/dL (ref >39)
LDL Chol Calc (NIH): 49 mg/dL (ref 0–99)
LDL/HDL Ratio: 0.6 ratio (ref 0.0–3.2)
Triglycerides: 85 mg/dL (ref 0–149)
VLDL Cholesterol Cal: 16 mg/dL (ref 5–40)

## 2021-09-28 LAB — INSULIN, RANDOM: INSULIN: 12.2 u[IU]/mL (ref 2.6–24.9)

## 2021-09-28 LAB — VITAMIN D 25 HYDROXY (VIT D DEFICIENCY, FRACTURES): Vit D, 25-Hydroxy: 30.3 ng/mL (ref 30.0–100.0)

## 2021-10-02 ENCOUNTER — Ambulatory Visit (INDEPENDENT_AMBULATORY_CARE_PROVIDER_SITE_OTHER): Payer: Medicare Other | Admitting: Physical Therapy

## 2021-10-02 ENCOUNTER — Encounter (INDEPENDENT_AMBULATORY_CARE_PROVIDER_SITE_OTHER): Payer: Self-pay | Admitting: Bariatrics

## 2021-10-02 DIAGNOSIS — G8929 Other chronic pain: Secondary | ICD-10-CM | POA: Diagnosis not present

## 2021-10-02 DIAGNOSIS — R262 Difficulty in walking, not elsewhere classified: Secondary | ICD-10-CM | POA: Diagnosis not present

## 2021-10-02 DIAGNOSIS — M6281 Muscle weakness (generalized): Secondary | ICD-10-CM | POA: Diagnosis not present

## 2021-10-02 DIAGNOSIS — M25561 Pain in right knee: Secondary | ICD-10-CM

## 2021-10-02 NOTE — Therapy (Unsigned)
OUTPATIENT PHYSICAL THERAPY LOWER EXTREMITY TREATMENT    Patient Name: NEZZIE MANERA MRN: [id number] DOB:Jan 07, 1951, 71 y.o., female Today's Date: 10/02/2021   PT End of Session - 10/03/21 1159     Visit Number 2    Number of Visits 12    Date for PT Re-Evaluation 11/09/21    Authorization Type medicare, BCBS supplement    Progress Note Due on Visit 10    PT Start Time 1517    PT Stop Time 1600    PT Time Calculation (min) 43 min    Activity Tolerance Patient tolerated treatment well    Behavior During Therapy [facility] for tasks assessed/performed              Past Medical History:  Diagnosis Date   Allergic rhinitis    Allergy    Arthritis    Brain tumor ([place])    Breast cyst 2007   Right   Cataract    surgery 07/2017   Clotting disorder ([place])    On plavix since stents  for artery blockages   Depression 2006   w/ menopause   Family history of breast cancer    Family history of colon cancer    Family history of kidney cancer 01/15/2018   Family history of lung cancer    Family history of pancreatic cancer    Family history of prostate cancer    Family history of uterine cancer    GERD (gastroesophageal reflux disease)    Glaucoma    Heart murmur    History of hysterectomy, supracervical    Hyperlipidemia    Hypertension    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   MLH1 [id number] (Splice site)   Multinodular goiter    PAD (peripheral artery disease) ([place])    LE Art [place] 1/18: Occluded prox-mid R SFA // ABIs 9/22: R 0.83; L 1.0   Pustular psoriasis    Tobacco user    Past Surgical History:  Procedure Laterality Date   [place] TUMOR EXCISION  02/24/2021   at [place]     CATARACT EXTRACTION Right 07/2017   COLONOSCOPY     CORONARY STENT INTERVENTION N/A 12/26/2018   Procedure: CORONARY STENT INTERVENTION;  Surgeon: Belva Crome, MD;  Location: [place] [facility];  Service:  Cardiovascular;  Laterality: N/A;   EYE SURGERY  2019, 2020   Cataracts, eye shunt implants   LEFT HEART CATH AND CORONARY ANGIOGRAPHY N/A 12/17/2018   Procedure: LEFT HEART CATH AND CORONARY ANGIOGRAPHY;  Surgeon: Belva Crome, MD;  Location: [place] [facility];  Service: Cardiovascular;  Laterality: N/A;   SUPRACERVICAL ABDOMINAL HYSTERECTOMY  1986   h/o supracervical hysterectomy   UPPER GASTROINTESTINAL ENDOSCOPY     Patient Active Problem List   Diagnosis Date Noted   Osteoarthritis 09/28/2021   Degeneration of lumbosacral intervertebral disc 09/28/2021   Elevated ALT measurement 09/28/2021   Other fatigue 09/27/2021   SOB (shortness of breath) on exertion 09/27/2021   Elevated glucose 09/27/2021   Health care maintenance 09/27/2021   Vitamin D deficiency 09/27/2021   Depression screening 09/27/2021   Class 1 obesity with serious comorbidity and body mass index (BMI) of 34.0 to 34.9 in adult 09/27/2021   Spondylosis without myelopathy 08/03/2021   Rheumatoid arthritis with rheumatoid factor ([place]) 08/03/2021   Psoriasis 08/03/2021   Hypopituitarism ([place]) 08/03/2021   Adrenal insufficiency ([place]) 08/03/2021   Gastroesophageal reflux disease 07/26/2021  Meningioma ([place]) 03/21/2021   Visual field defect 03/21/2021   Chronic left shoulder pain 03/21/2021   Cervical radiculopathy 03/21/2021   History of pituitary surgery 03/06/2021   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   Pseudophakia of both eyes 06/29/2019   Steroid responders to glaucoma of both eyes 06/29/2019   S/P coronary artery stent placement    PAD (peripheral artery disease) (HCC)    CAD (coronary artery disease) 12/26/2018   Angina pectoris ([place])    Primary open-angle glaucoma 10/28/2018   Glaucoma associated with ocular inflammation 10/28/2018   Uveitic glaucoma of right eye, severe stage 10/28/2018   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 ([place]) 08/21/2018    Aortic atherosclerosis ([place]) 08/19/2018   Uveitis 05/05/2018   Goiter 05/05/2018   Mixed hyperlipidemia 05/05/2018   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   Genetic testing 02/07/2018   Family history of kidney cancer 01/15/2018   Family history of pancreatic cancer    Family history of breast cancer    Family history of uterine cancer    Family history of colon cancer    Family history of prostate cancer    Family history of lung cancer    Hypertensive retinopathy of both eyes 11/12/2017   Iritis of right eye 11/12/2017   Nuclear sclerotic cataract of left eye 11/12/2017   Ocular hypertension of right eye 11/12/2017   Inflammation of eye, right 08/07/2017   Memory loss 09/21/2016   Essential hypertension 09/21/2016   Low vitamin D level 09/21/2016   DOE (dyspnea on exertion) 11/08/2010    PCP: Wendie Agreste, MD  REFERRING PROVIDER: Gregor Hams, MD  REFERRING DIAG: [phone] (ICD-10-CM) - Chronic pain of right knee M17.11 (ICD-10-CM) - Primary osteoarthritis of right knee  THERAPY DIAG:  Chronic pain of right knee  Muscle weakness (generalized)  Difficulty in walking, not elsewhere classified  Rationale for Evaluation and Treatment Rehabilitation  ONSET DATE: August 2023  SUBJECTIVE:   SUBJECTIVE STATEMENT: Pt states knee doing a bit better, not too sore, just feels unstable and catching. Increased pain after increased walking.   Eval: Last august she tore the meniscus in her right knee. States that her MD recommend a knee replacement. States she already had a brain tumor that needed to be removed and she removed in December. States that she tried PT but it didn't help. States that she then saw Dr. Noemi Chapel and he gave her a shot and she has been wearing a brace since. States that she now can walk with the injection and the brace. States that she was out walking and was rec to see Dr. Georgina Snell and they are starting the gel injections next Tuesday. States that she was able to  [place] which is the first time in 2 years. States she would like to get her knees stronger.   Patient reports she originally tore her meniscus when she rolled over in bed trying to do an exercise.  PERTINENT HISTORY: clotting issuses - on blood thinners, cardiac disease, history of meningioma.   PAIN:  Are you having pain? Yes: NPRS scale: 5/10 Pain location: right knee medial Pain description: sharp, grabbing Aggravating factors: walking, standing, fast walking  Relieving factors: rest, brace, bands    WEIGHT BEARING RESTRICTIONS No  FALLS:  Has patient fallen in last 6 months? No   OCCUPATION: not currently working  PLOF: Independent  PATIENT GOALS to have less pain and to  get more active.    OBJECTIVE:   DIAGNOSTIC FINDINGS: previous imaging of knee demonstrated torn meniscus per patient    COGNITION:  Overall cognitive status: Within functional limits for tasks assessed     SENSATION: WFL  EDEMA:  Circumferential: left 37.0 cm, Right 38.5cm    PALPATION: Swelling throughout right leg, increased resting muscle tension in right leg, patella hypermobile laterally/medially but positioned superiorly    LE Measurements Lower Extremity Right 09/28/2021 Left 09/28/2021   A/PROM MMT A/PROM MMT  Hip Flexion  4  4  Hip Extension      Hip Abduction      Hip Adduction      Hip Internal rotation      Hip External rotation      Knee Flexion 130 4- 135 4-  Knee Extension 0 4 4 (hyper) 4  Ankle Dorsiflexion  5  5  Ankle Plantarflexion      Ankle Inversion      Ankle Eversion       (Blank rows = not tested)  * pain    GAIT Comments: with brace wears brace, limited knee extension, foot flat gait    TODAY'S TREATMENT: 10/02/2021 Therapeutic Exercise: Aerobic:  Supine: thomas stretch x3 30" holds R;  SLR x 15 bil; Heel slides x 10;  Seated: LAQ x 10 bil;  Standing: Marching x 20; HR x 20;  Neuromuscular  Re-education: Manual Therapy: Modalities:    PATIENT EDUCATION:  Education details: Reviewed HEP  Person educated: Patient Education method: Explanation, Demonstration, and Handouts Education comprehension: verbalized understanding   HOME EXERCISE PROGRAM: [id number]  ASSESSMENT:  CLINICAL IMPRESSION: 10/02/21: Pt with good tolerance for light strengthening for hip and knee. Quad strength added to HEP. Discussed not over doing standing/walking for exercise, and walking only to tolerance, and doing recumbent bike. Plan to progress as tolerated.     Patient is a 71 y.o. female who was seen today for physical therapy evaluation and treatment for right knee pain that primarily hurts when the bone "shifts out of place." Patient presents with swelling, weakness and instability in right knee. Educated patient on signs and symptoms more consistent with patella instability vs. Meniscal tear but how both could be contributing to current presentation. Patient would greatly benefit from skilled PT to improve overall function and QOL.    OBJECTIVE IMPAIRMENTS Abnormal gait, decreased activity tolerance, decreased balance, decreased knowledge of use of DME, difficulty walking, decreased ROM, decreased strength, increased edema, and pain.   ACTIVITY LIMITATIONS lifting, standing, squatting, stairs, transfers, and locomotion level  PARTICIPATION LIMITATIONS: cleaning and community activity  PERSONAL FACTORS Age are also affecting patient's functional outcome.   REHAB POTENTIAL: Good  CLINICAL DECISION MAKING: Stable/uncomplicated  EVALUATION COMPLEXITY: Low  GOALS: Goals reviewed with patient?  yes  SHORT TERM GOALS:  Patient will be independent in self management strategies to improve quality of life and functional outcomes. Baseline: new program Target date: 10/19/2021 Goal status: INITIAL  2.  Patient will report at least 50% improvement in overall symptoms and/or function to  demonstrate improved functional mobility Baseline: 0% Target date: 10/19/2021 Goal status: INITIAL  3.  Patient will demonstrate swelling in legs within .5cm of each other to demonstrate reduced swelling. Baseline: see above Target date: 10/19/2021 Goal status: INITIAL     LONG TERM GOALS:  Patient will report at least 75% improvement in overall symptoms and/or function to demonstrate improved functional mobility Baseline: 0% Target date: 11/09/2021 Goal status: INITIAL  2.  Patient will be able to walk 1-2 miles without pain during or after to improve ability to walk her dog Baseline: unable Target date: 11/09/2021 Goal status: INITIAL  3.  Patent will demonstrate 4+/5 MMT in LE to demonstrate improved strength. Baseline: see above Target date: 11/09/2021 Goal status: INITIAL      PLAN: PT FREQUENCY: 2x/week  PT DURATION: 6 weeks  PLANNED INTERVENTIONS: Therapeutic exercises, Therapeutic activity, Neuromuscular re-education, Balance training, Gait training, Patient/Family education, Self Care, Joint mobilization, Joint manipulation, Stair training, Vestibular training, Orthotic/Fit training, DME instructions, Aquatic Therapy, Dry Needling, Electrical stimulation, Cryotherapy, Moist heat, Traction, Ultrasound, Ionotophoresis 63m/ml Dexamethasone, and Manual therapy  PLAN FOR NEXT SESSION: assess hip strength- strengthen hip/LE, STM as indicated  LLyndee Hensen PT, DPT 12:10 PM  10/03/21

## 2021-10-03 ENCOUNTER — Encounter: Payer: Self-pay | Admitting: Physical Therapy

## 2021-10-03 ENCOUNTER — Ambulatory Visit (INDEPENDENT_AMBULATORY_CARE_PROVIDER_SITE_OTHER): Payer: Medicare Other | Admitting: Family Medicine

## 2021-10-03 ENCOUNTER — Ambulatory Visit: Payer: Self-pay

## 2021-10-03 DIAGNOSIS — M1711 Unilateral primary osteoarthritis, right knee: Secondary | ICD-10-CM | POA: Diagnosis not present

## 2021-10-03 DIAGNOSIS — M25561 Pain in right knee: Secondary | ICD-10-CM

## 2021-10-03 DIAGNOSIS — G8929 Other chronic pain: Secondary | ICD-10-CM

## 2021-10-03 MED ORDER — HYALURONAN 30 MG/2ML IX SOSY
30.0000 mg | PREFILLED_SYRINGE | Freq: Once | INTRA_ARTICULAR | Status: AC
  Administered 2021-10-03: 30 mg via INTRA_ARTICULAR

## 2021-10-03 NOTE — Patient Instructions (Addendum)
Thank you for coming in today.   You received an injection today. Seek immediate medical attention if the joint becomes red, extremely painful, or is oozing fluid.   Schedule the 2nd Orthovisc injection for next week and the 3rd for the week after that. 

## 2021-10-03 NOTE — Progress Notes (Signed)
[place] presents to clinic today for Orthovisc injection right knee 1/3  Procedure: Real-time Ultrasound Guided Injection of right knee superior lateral patellar space Device: Philips Affiniti 50G Images permanently stored and available for review in PACS Verbal informed consent obtained.  Discussed risks and benefits of procedure. Warned about infection, bleeding, damage to structures among others. Patient expresses understanding and agreement Time-out conducted.   Noted no overlying erythema, induration, or other signs of local infection.   Skin prepped in a sterile fashion.   Local anesthesia: Topical Ethyl chloride.   With sterile technique and under real time ultrasound guidance: Orthovisc 30 mg injected into knee joint. Fluid seen entering the joint capsule.   Completed without difficulty   Advised to call if fevers/chills, erythema, induration, drainage, or persistent bleeding.   Images permanently stored and available for review in the ultrasound unit.  Impression: Technically successful ultrasound guided injection.  Lot number: [id number]/17/[id number]  Return in 1 week for Orthovisc injection right knee 2/3  She would also like to pursue off loader knee brace.  We will contact joint. Knee brace should help with knee instability and pain due to DJD.  Knee exam significant for mild laxity and instability.

## 2021-10-06 ENCOUNTER — Encounter: Payer: Self-pay | Admitting: Physical Therapy

## 2021-10-06 ENCOUNTER — Ambulatory Visit (INDEPENDENT_AMBULATORY_CARE_PROVIDER_SITE_OTHER): Payer: Medicare Other | Admitting: Physical Therapy

## 2021-10-06 DIAGNOSIS — R262 Difficulty in walking, not elsewhere classified: Secondary | ICD-10-CM | POA: Diagnosis not present

## 2021-10-06 DIAGNOSIS — M25561 Pain in right knee: Secondary | ICD-10-CM | POA: Diagnosis not present

## 2021-10-06 DIAGNOSIS — G8929 Other chronic pain: Secondary | ICD-10-CM | POA: Diagnosis not present

## 2021-10-06 DIAGNOSIS — M6281 Muscle weakness (generalized): Secondary | ICD-10-CM | POA: Diagnosis not present

## 2021-10-06 NOTE — Therapy (Signed)
OUTPATIENT PHYSICAL THERAPY LOWER EXTREMITY TREATMENT    Patient Name: MURL ZOGG MRN: [id number] DOB:1950/08/29, 71 y.o., female Today's Date: 10/06/2021   PT End of Session - 10/06/21 1023     Visit Number 3    Number of Visits 12    Date for PT Re-Evaluation 11/09/21    Authorization Type medicare, BCBS supplement    Progress Note Due on Visit 10    PT Start Time 1019    PT Stop Time 1100    PT Time Calculation (min) 41 min    Activity Tolerance Patient tolerated treatment well    Behavior During Therapy [facility] for tasks assessed/performed               Past Medical History:  Diagnosis Date   Allergic rhinitis    Allergy    Arthritis    Brain tumor ([place])    Breast cyst 2007   Right   Cataract    surgery 07/2017   Clotting disorder ([place])    On plavix since stents  for artery blockages   Depression 2006   w/ menopause   Family history of breast cancer    Family history of colon cancer    Family history of kidney cancer 01/15/2018   Family history of lung cancer    Family history of pancreatic cancer    Family history of prostate cancer    Family history of uterine cancer    GERD (gastroesophageal reflux disease)    Glaucoma    Heart murmur    History of hysterectomy, supracervical    Hyperlipidemia    Hypertension    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   MLH1 [id number] (Splice site)   Multinodular goiter    PAD (peripheral artery disease) ([place])    LE Art [place] 1/18: Occluded prox-mid R SFA // ABIs 9/22: R 0.83; L 1.0   Pustular psoriasis    Tobacco user    Past Surgical History:  Procedure Laterality Date   ABDOMINAL HYSTERECTOMY  1995   BRAIN TUMOR EXCISION  02/24/2021   at [place]     CATARACT EXTRACTION Right 07/2017   COLONOSCOPY     CORONARY STENT INTERVENTION N/A 12/26/2018   Procedure: CORONARY STENT INTERVENTION;  Surgeon: Belva Crome, MD;  Location: [facility];   Service: Cardiovascular;  Laterality: N/A;   EYE SURGERY  2019, 2020   Cataracts, eye shunt implants   LEFT HEART CATH AND CORONARY ANGIOGRAPHY N/A 12/17/2018   Procedure: LEFT HEART CATH AND CORONARY ANGIOGRAPHY;  Surgeon: Belva Crome, MD;  Location: [facility];  Service: Cardiovascular;  Laterality: N/A;   SUPRACERVICAL ABDOMINAL HYSTERECTOMY  1986   h/o supracervical hysterectomy   UPPER GASTROINTESTINAL ENDOSCOPY     Patient Active Problem List   Diagnosis Date Noted   Osteoarthritis 09/28/2021   Degeneration of lumbosacral intervertebral disc 09/28/2021   Elevated ALT measurement 09/28/2021   Other fatigue 09/27/2021   SOB (shortness of breath) on exertion 09/27/2021   Elevated glucose 09/27/2021   Health care maintenance 09/27/2021   Vitamin D deficiency 09/27/2021   Depression screening 09/27/2021   Class 1 obesity with serious comorbidity and body mass index (BMI) of 34.0 to 34.9 in adult 09/27/2021   Spondylosis without myelopathy 08/03/2021   Rheumatoid arthritis with rheumatoid factor ([place]) 08/03/2021   Psoriasis 08/03/2021   Hypopituitarism ([place]) 08/03/2021   Adrenal insufficiency ([place]) 08/03/2021   Gastroesophageal reflux disease  07/26/2021   Meningioma ([place]) 03/21/2021   Visual field defect 03/21/2021   Chronic left shoulder pain 03/21/2021   Cervical radiculopathy 03/21/2021   History of pituitary surgery 03/06/2021   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   Pseudophakia of both eyes 06/29/2019   Steroid responders to glaucoma of both eyes 06/29/2019   S/P coronary artery stent placement    PAD (peripheral artery disease) (HCC)    CAD (coronary artery disease) 12/26/2018   Angina pectoris ([place])    Primary open-angle glaucoma 10/28/2018   Glaucoma associated with ocular inflammation 10/28/2018   Uveitic glaucoma of right eye, severe stage 10/28/2018   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 ([place])  08/21/2018   Aortic atherosclerosis ([place]) 08/19/2018   Uveitis 05/05/2018   Goiter 05/05/2018   Mixed hyperlipidemia 05/05/2018   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   Genetic testing 02/07/2018   Family history of kidney cancer 01/15/2018   Family history of pancreatic cancer    Family history of breast cancer    Family history of uterine cancer    Family history of colon cancer    Family history of prostate cancer    Family history of lung cancer    Hypertensive retinopathy of both eyes 11/12/2017   Iritis of right eye 11/12/2017   Nuclear sclerotic cataract of left eye 11/12/2017   Ocular hypertension of right eye 11/12/2017   Inflammation of eye, right 08/07/2017   Memory loss 09/21/2016   Essential hypertension 09/21/2016   Low vitamin D level 09/21/2016   DOE (dyspnea on exertion) 11/08/2010    PCP: Wendie Agreste, MD  REFERRING PROVIDER: Gregor Hams, MD  REFERRING DIAG: [phone] (ICD-10-CM) - Chronic pain of right knee M17.11 (ICD-10-CM) - Primary osteoarthritis of right knee  THERAPY DIAG:  Chronic pain of right knee  Muscle weakness (generalized)  Difficulty in walking, not elsewhere classified  Rationale for Evaluation and Treatment Rehabilitation  ONSET DATE: August 2023  SUBJECTIVE:   SUBJECTIVE STATEMENT: Pt states knee dong ok, had 1st gel injection this week, will have 1 per week/total of 3.   Eval: Last august she tore the meniscus in her right knee. States that her MD recommend a knee replacement. States she already had a brain tumor that needed to be removed and she removed in December. States that she tried PT but it didn't help. States that she then saw Dr. Noemi Chapel and he gave her a shot and she has been wearing a brace since. States that she now can walk with the injection and the brace. States that she was out walking and was rec to see Dr. Georgina Snell and they are starting the gel injections next Tuesday. States that she was able to walk [place] which is the first time in 2 years. States she would like to get her knees stronger.   Patient reports she originally tore her meniscus when she rolled over in bed trying to do an exercise.  PERTINENT HISTORY: clotting issuses - on blood thinners, cardiac disease, history of meningioma.   PAIN:  Are you having pain? Yes: NPRS scale: 5/10 Pain location: right knee medial Pain description: sharp, grabbing Aggravating factors: walking, standing, fast walking  Relieving factors: rest, brace, bands    WEIGHT BEARING RESTRICTIONS No  FALLS:  Has patient fallen in last 6 months? No   OCCUPATION: not currently working  PLOF: Independent  PATIENT GOALS to have less pain and  to get more active.    OBJECTIVE:   DIAGNOSTIC FINDINGS: previous imaging of knee demonstrated torn meniscus per patient    COGNITION:  Overall cognitive status: Within functional limits for tasks assessed     SENSATION: WFL  EDEMA:  Circumferential: left 37.0 cm, Right 38.5cm    PALPATION: Swelling throughout right leg, increased resting muscle tension in right leg, patella hypermobile laterally/medially but positioned superiorly    LE Measurements Lower Extremity Right 09/28/2021 Left 09/28/2021   A/PROM MMT A/PROM MMT  Hip Flexion  4  4  Hip Extension      Hip Abduction      Hip Adduction      Hip Internal rotation      Hip External rotation      Knee Flexion 130 4- 135 4-  Knee Extension 0 4 4 (hyper) 4  Ankle Dorsiflexion  5  5  Ankle Plantarflexion      Ankle Inversion      Ankle Eversion       (Blank rows = not tested)  * pain    GAIT Comments: with brace wears brace, limited knee extension, foot flat gait    TODAY'S TREATMENT:  10/06/2021 Therapeutic Exercise: Aerobic: Recumbent bike L1 x 7 min  Supine: thomas stretch x 3 30" holds R;  Quad sets 3 sec holds x 20 bil;  SLR 2 x 10 bil; Heel slides x 15 on R  ;  Seated: LAQ 2 lb x 10 bil;  [place] GTB  2x10 bil;  Standing: Marching x 20; HR x 20;  Neuromuscular Re-education: Manual Therapy: Stm/roller to R quad/thigh Modalities:    PATIENT EDUCATION:  Education details: Reviewed HEP  Person educated: Patient Education method: Explanation, Demonstration, and Handouts Education comprehension: verbalized understanding   HOME EXERCISE PROGRAM: [id number]  ASSESSMENT:  CLINICAL IMPRESSION: 10/06/21: Pt with good tolerance for ther ex and strengthening today. Minimal pain in knee with activity. Pt notes she feels "off balance" at times, will add standing balance/tandem stance next visit. Discussed likely due to knee instability , as well as possible foot position being contributory.  Plan to progress as tolerated.   Patient is a 71 y.o. female who was seen today for physical therapy evaluation and treatment for right knee pain that primarily hurts when the bone "shifts out of place." Patient presents with swelling, weakness and instability in right knee. Educated patient on signs and symptoms more consistent with patella instability vs. Meniscal tear but how both could be contributing to current presentation. Patient would greatly benefit from skilled PT to improve overall function and QOL.    OBJECTIVE IMPAIRMENTS Abnormal gait, decreased activity tolerance, decreased balance, decreased knowledge of use of DME, difficulty walking, decreased ROM, decreased strength, increased edema, and pain.   ACTIVITY LIMITATIONS lifting, standing, squatting, stairs, transfers, and locomotion level  PARTICIPATION LIMITATIONS: cleaning and community activity  PERSONAL FACTORS Age are also affecting patient's functional outcome.   REHAB POTENTIAL: Good  CLINICAL DECISION MAKING: Stable/uncomplicated  EVALUATION COMPLEXITY: Low  GOALS: Goals reviewed with patient?  yes  SHORT TERM GOALS:  Patient will be independent in self management strategies to improve quality of life and functional  outcomes. Baseline: new program Target date: 10/19/2021 Goal status: INITIAL  2.  Patient will report at least 50% improvement in overall symptoms and/or function to demonstrate improved functional mobility Baseline: 0% Target date: 10/19/2021 Goal status: INITIAL  3.  Patient will demonstrate swelling in legs within .5cm of each other to demonstrate  reduced swelling. Baseline: see above Target date: 10/19/2021 Goal status: INITIAL     LONG TERM GOALS:  Patient will report at least 75% improvement in overall symptoms and/or function to demonstrate improved functional mobility Baseline: 0% Target date: 11/09/2021 Goal status: INITIAL  2.  Patient will be able to walk 1-2 miles without pain during or after to improve ability to walk her dog Baseline: unable Target date: 11/09/2021 Goal status: INITIAL  3.  Patent will demonstrate 4+/5 MMT in LE to demonstrate improved strength. Baseline: see above Target date: 11/09/2021 Goal status: INITIAL      PLAN: PT FREQUENCY: 2x/week  PT DURATION: 6 weeks  PLANNED INTERVENTIONS: Therapeutic exercises, Therapeutic activity, Neuromuscular re-education, Balance training, Gait training, Patient/Family education, Self Care, Joint mobilization, Joint manipulation, Stair training, Vestibular training, Orthotic/Fit training, DME instructions, Aquatic Therapy, Dry Needling, Electrical stimulation, Cryotherapy, Moist heat, Traction, Ultrasound, Ionotophoresis 86m/ml Dexamethasone, and Manual therapy  PLAN FOR NEXT SESSION: assess hip strength- strengthen hip/LE, STM as indicated, add standing balance/tandem stance.   LLyndee Hensen PT, DPT 12:01 PM  07/21/23a

## 2021-10-09 ENCOUNTER — Ambulatory Visit (INDEPENDENT_AMBULATORY_CARE_PROVIDER_SITE_OTHER): Payer: Medicare Other | Admitting: Physical Therapy

## 2021-10-09 ENCOUNTER — Encounter: Payer: Self-pay | Admitting: Physical Therapy

## 2021-10-09 DIAGNOSIS — G8929 Other chronic pain: Secondary | ICD-10-CM | POA: Diagnosis not present

## 2021-10-09 DIAGNOSIS — M25561 Pain in right knee: Secondary | ICD-10-CM

## 2021-10-09 DIAGNOSIS — M6281 Muscle weakness (generalized): Secondary | ICD-10-CM | POA: Diagnosis not present

## 2021-10-09 DIAGNOSIS — R262 Difficulty in walking, not elsewhere classified: Secondary | ICD-10-CM

## 2021-10-09 NOTE — Therapy (Signed)
OUTPATIENT PHYSICAL THERAPY LOWER EXTREMITY TREATMENT    Patient Name: Pamela Lowe MRN: [id number] DOB:[birth year], 71 y.o., female Today's Date: 10/09/2021   PT End of Session - 10/09/21 1521     Visit Number 4    Number of Visits 12    Date for PT Re-Evaluation 11/09/21    Authorization Type medicare, BCBS supplement    Progress Note Due on Visit 10    PT Start Time 1518    PT Stop Time 1558    PT Time Calculation (min) 40 min    Activity Tolerance Patient tolerated treatment well    Behavior During Therapy [facility] for tasks assessed/performed                Past Medical History:  Diagnosis Date   Allergic rhinitis    Allergy    Arthritis    Brain tumor ([place])    Breast cyst 2007   Right   Cataract    surgery 07/2017   Clotting disorder ([place])    On plavix since stents  for artery blockages   Depression 2006   w/ menopause   Family history of breast cancer    Family history of colon cancer    Family history of kidney cancer 01/15/2018   Family history of lung cancer    Family history of pancreatic cancer    Family history of prostate cancer    Family history of uterine cancer    GERD (gastroesophageal reflux disease)    Glaucoma    Heart murmur    History of hysterectomy, supracervical    Hyperlipidemia    Hypertension    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   MLH1 [id number] (Splice site)   Multinodular goiter    PAD (peripheral artery disease) ([place])    LE Art [place] 1/18: Occluded prox-mid R SFA // ABIs 9/22: R 0.83; L 1.0   Pustular psoriasis    Tobacco user    Past Surgical History:  Procedure Laterality Date   ABDOMINAL HYSTERECTOMY  1995   BRAIN TUMOR EXCISION  02/24/2021   at [place]     CATARACT EXTRACTION Right 07/2017   COLONOSCOPY     CORONARY STENT INTERVENTION N/A 12/26/2018   Procedure: CORONARY STENT INTERVENTION;  Surgeon: Belva Crome, MD;  Location: [place] [facility];   Service: Cardiovascular;  Laterality: N/A;   EYE SURGERY  2019, 2020   Cataracts, eye shunt implants   LEFT HEART CATH AND CORONARY ANGIOGRAPHY N/A 12/17/2018   Procedure: LEFT HEART CATH AND CORONARY ANGIOGRAPHY;  Surgeon: Belva Crome, MD;  Location: [place] [facility];  Service: Cardiovascular;  Laterality: N/A;   SUPRACERVICAL ABDOMINAL HYSTERECTOMY  1986   h/o supracervical hysterectomy   UPPER GASTROINTESTINAL ENDOSCOPY     Patient Active Problem List   Diagnosis Date Noted   Osteoarthritis 09/28/2021   Degeneration of lumbosacral intervertebral disc 09/28/2021   Elevated ALT measurement 09/28/2021   Other fatigue 09/27/2021   SOB (shortness of breath) on exertion 09/27/2021   Elevated glucose 09/27/2021   Health care maintenance 09/27/2021   Vitamin D deficiency 09/27/2021   Depression screening 09/27/2021   Class 1 obesity with serious comorbidity and body mass index (BMI) of 34.0 to 34.9 in adult 09/27/2021   Spondylosis without myelopathy 08/03/2021   Rheumatoid arthritis with rheumatoid factor ([place]) 08/03/2021   Psoriasis 08/03/2021   Hypopituitarism ([place]) 08/03/2021   Adrenal insufficiency ([place]) 08/03/2021   Gastroesophageal reflux  disease 07/26/2021   Meningioma ([place]) 03/21/2021   Visual field defect 03/21/2021   Chronic left shoulder pain 03/21/2021   Cervical radiculopathy 03/21/2021   History of pituitary surgery 03/06/2021   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   Pseudophakia of both eyes 06/29/2019   Steroid responders to glaucoma of both eyes 06/29/2019   S/P coronary artery stent placement    PAD (peripheral artery disease) ([facility])    CAD (coronary artery disease) 12/26/2018   Angina pectoris ([place])    Primary open-angle glaucoma 10/28/2018   Glaucoma associated with ocular inflammation 10/28/2018   Uveitic glaucoma of right eye, severe stage 10/28/2018   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 ([place])  08/21/2018   Aortic atherosclerosis ([place]) 08/19/2018   Uveitis 05/05/2018   Goiter 05/05/2018   Mixed hyperlipidemia 05/05/2018   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   Genetic testing 02/07/2018   Family history of kidney cancer 01/15/2018   Family history of pancreatic cancer    Family history of breast cancer    Family history of uterine cancer    Family history of colon cancer    Family history of prostate cancer    Family history of lung cancer    Hypertensive retinopathy of both eyes 11/12/2017   Iritis of right eye 11/12/2017   Nuclear sclerotic cataract of left eye 11/12/2017   Ocular hypertension of right eye 11/12/2017   Inflammation of eye, right 08/07/2017   Memory loss 09/21/2016   Essential hypertension 09/21/2016   Low vitamin D level 09/21/2016   DOE (dyspnea on exertion) 11/08/2010    PCP: Wendie Agreste, MD  REFERRING PROVIDER: Gregor Hams, MD  REFERRING DIAG: [phone] (ICD-10-CM) - Chronic pain of right knee M17.11 (ICD-10-CM) - Primary osteoarthritis of right knee  THERAPY DIAG:  Chronic pain of right knee  Muscle weakness (generalized)  Difficulty in walking, not elsewhere classified  Rationale for Evaluation and Treatment Rehabilitation  ONSET DATE: August 2023  SUBJECTIVE:   SUBJECTIVE STATEMENT: Pt states doing well.   Eval: Last august she tore the meniscus in her right knee. States that her MD recommend a knee replacement. States she already had a brain tumor that needed to be removed and she removed in December. States that she tried PT but it didn't help. States that she then saw Dr. Noemi Chapel and he gave her a shot and she has been wearing a brace since. States that she now can walk with the injection and the brace. States that she was out walking and was rec to see Dr. Georgina Snell and they are starting the gel injections next Tuesday. States that she was able to walk [place] which is the first time in 2 years.  States she would like to get her knees stronger.   Patient reports she originally tore her meniscus when she rolled over in bed trying to do an exercise.  PERTINENT HISTORY: clotting issuses - on blood thinners, cardiac disease, history of meningioma.   PAIN:  Are you having pain? Yes: NPRS scale: 5/10 Pain location: right knee medial Pain description: sharp, grabbing Aggravating factors: walking, standing, fast walking  Relieving factors: rest, brace, bands    WEIGHT BEARING RESTRICTIONS No  FALLS:  Has patient fallen in last 6 months? No   OCCUPATION: not currently working  PLOF: Independent  PATIENT GOALS to have less pain and to get more active.    OBJECTIVE:   DIAGNOSTIC FINDINGS: previous  imaging of knee demonstrated torn meniscus per patient    COGNITION:  Overall cognitive status: Within functional limits for tasks assessed     SENSATION: WFL  EDEMA:  Circumferential: left 37.0 cm, Right 38.5cm    PALPATION: Swelling throughout right leg, increased resting muscle tension in right leg, patella hypermobile laterally/medially but positioned superiorly    LE Measurements Lower Extremity Right 09/28/2021 Left 09/28/2021   A/PROM MMT A/PROM MMT  Hip Flexion  4  4  Hip Extension      Hip Abduction      Hip Adduction      Hip Internal rotation      Hip External rotation      Knee Flexion 130 4- 135 4-  Knee Extension 0 4 4 (hyper) 4  Ankle Dorsiflexion  5  5  Ankle Plantarflexion      Ankle Inversion      Ankle Eversion       (Blank rows = not tested)  * pain    GAIT Comments: with brace wears brace, limited knee extension, foot flat gait    TODAY'S TREATMENT:  10/09/21: Therapeutic Exercise: Aerobic: Recumbent bike L1 x 7 min  Supine:   Quad sets 3 sec holds x 20 bil;  SLR 2 x 10 bil;  Seated: LAQ 2.5  lb  2x 10 bil;    Standing: Marching x 20;  HR x 20;  Neuromuscular Re-education:  L/R weight shifts x 20 ea on floor and on AirEx;    Staggered stance weight shifts fwd/bwd x 15 ea bil;  Manual Therapy:  Modalities:    10/06/2021 Therapeutic Exercise: Aerobic: Recumbent bike L1 x 7 min  Supine: thomas stretch x 3 30" holds R;  Quad sets 3 sec holds x 20 bil;  SLR 2 x 10 bil; Heel slides x 15 on R  ;  Seated: LAQ 2 lb x 10 bil; [place] GTB  2x10 bil;  Standing: Marching x 20; HR x 20;  Neuromuscular Re-education: Manual Therapy: Stm/roller to R quad/thigh Modalities:    PATIENT EDUCATION:  Education details: Reviewed HEP  Person educated: Patient Education method: Explanation, Demonstration, and Handouts Education comprehension: verbalized understanding   HOME EXERCISE PROGRAM: [id number]  ASSESSMENT:  CLINICAL IMPRESSION: 10/09/21: Pt with good ability for ther ex and strengthening today, minimal pain in knee. Added standing balance, pt challenged, and will benefit from continued work on this. She also has noted increased supination in R foot with shoe on, not present with shoes off, likely getting over correction from current sneaker and orthotic- she will bring in other shoes next visit to assess.   Patient is a 71 y.o. female who was seen today for physical therapy evaluation and treatment for right knee pain that primarily hurts when the bone "shifts out of place." Patient presents with swelling, weakness and instability in right knee. Educated patient on signs and symptoms more consistent with patella instability vs. Meniscal tear but how both could be contributing to current presentation. Patient would greatly benefit from skilled PT to improve overall function and QOL.    OBJECTIVE IMPAIRMENTS Abnormal gait, decreased activity tolerance, decreased balance, decreased knowledge of use of DME, difficulty walking, decreased ROM, decreased strength, increased edema, and pain.   ACTIVITY LIMITATIONS lifting, standing, squatting, stairs, transfers, and locomotion level  PARTICIPATION LIMITATIONS: cleaning and  community activity  PERSONAL FACTORS Age are also affecting patient's functional outcome.   REHAB POTENTIAL: Good  CLINICAL DECISION MAKING: Stable/uncomplicated  EVALUATION COMPLEXITY: Low  GOALS: Goals reviewed with patient?  yes  SHORT TERM GOALS:  Patient will be independent in self management strategies to improve quality of life and functional outcomes. Baseline: new program Target date: 10/19/2021 Goal status: INITIAL  2.  Patient will report at least 50% improvement in overall symptoms and/or function to demonstrate improved functional mobility Baseline: 0% Target date: 10/19/2021 Goal status: INITIAL  3.  Patient will demonstrate swelling in legs within .5cm of each other to demonstrate reduced swelling. Baseline: see above Target date: 10/19/2021 Goal status: INITIAL     LONG TERM GOALS:  Patient will report at least 75% improvement in overall symptoms and/or function to demonstrate improved functional mobility Baseline: 0% Target date: 11/09/2021 Goal status: INITIAL  2.  Patient will be able to walk 1-2 miles without pain during or after to improve ability to walk her dog Baseline: unable Target date: 11/09/2021 Goal status: INITIAL  3.  Patent will demonstrate 4+/5 MMT in LE to demonstrate improved strength. Baseline: see above Target date: 11/09/2021 Goal status: INITIAL      PLAN: PT FREQUENCY: 2x/week  PT DURATION: 6 weeks  PLANNED INTERVENTIONS: Therapeutic exercises, Therapeutic activity, Neuromuscular re-education, Balance training, Gait training, Patient/Family education, Self Care, Joint mobilization, Joint manipulation, Stair training, Vestibular training, Orthotic/Fit training, DME instructions, Aquatic Therapy, Dry Needling, Electrical stimulation, Cryotherapy, Moist heat, Traction, Ultrasound, Ionotophoresis 36m/ml Dexamethasone, and Manual therapy  PLAN FOR NEXT SESSION: assess hip strength- strengthen hip/LE, STM as indicated, add  standing balance/tandem stance.   LLyndee Hensen PT, DPT 4:00 PM  10/09/21

## 2021-10-10 ENCOUNTER — Ambulatory Visit: Payer: Self-pay

## 2021-10-10 ENCOUNTER — Ambulatory Visit (INDEPENDENT_AMBULATORY_CARE_PROVIDER_SITE_OTHER): Payer: Medicare Other | Admitting: Family Medicine

## 2021-10-10 DIAGNOSIS — M25561 Pain in right knee: Secondary | ICD-10-CM

## 2021-10-10 DIAGNOSIS — M1711 Unilateral primary osteoarthritis, right knee: Secondary | ICD-10-CM | POA: Diagnosis not present

## 2021-10-10 DIAGNOSIS — G8929 Other chronic pain: Secondary | ICD-10-CM

## 2021-10-10 MED ORDER — HYALURONAN 30 MG/2ML IX SOSY
30.0000 mg | PREFILLED_SYRINGE | Freq: Once | INTRA_ARTICULAR | Status: AC
  Administered 2021-10-10: 30 mg via INTRA_ARTICULAR

## 2021-10-10 NOTE — Progress Notes (Signed)
Pamela Lowe presents to clinic today for Orthovisc injection right knee 2/3  Procedure: Real-time Ultrasound Guided Injection of right knee superior lateral patellar space Device: Philips Affiniti 50G Images permanently stored and available for review in PACS Verbal informed consent obtained.  Discussed risks and benefits of procedure. Warned about infection, bleeding, damage to structures among others. Patient expresses understanding and agreement Time-out conducted.   Noted no overlying erythema, induration, or other signs of local infection.   Skin prepped in a sterile fashion.   Local anesthesia: Topical Ethyl chloride.   With sterile technique and under real time ultrasound guidance: Orthovisc 30 mg injected into right knee. Fluid seen entering the joint.   Completed without difficulty   Advised to call if fevers/chills, erythema, induration, drainage, or persistent bleeding.   Images permanently stored and available for review in the ultrasound unit.  Impression: Technically successful ultrasound guided injection.  Lot number: [id number]  Return in 1 week for Orthovisc injection right knee 3/3

## 2021-10-10 NOTE — Patient Instructions (Signed)
Good to see you today.  You had a R knee Orthovisc injection (2/3).  Call or go to the ER if you develop a large red swollen joint with extreme pain or oozing puss.   Follow-up next week for your final Orthovisc injection.

## 2021-10-11 ENCOUNTER — Ambulatory Visit (INDEPENDENT_AMBULATORY_CARE_PROVIDER_SITE_OTHER): Payer: Medicare Other | Admitting: Bariatrics

## 2021-10-11 ENCOUNTER — Encounter (INDEPENDENT_AMBULATORY_CARE_PROVIDER_SITE_OTHER): Payer: Self-pay | Admitting: Bariatrics

## 2021-10-11 VITALS — BP 109/70 | HR 60 | Temp 98.6°F | Ht 66.0 in | Wt 206.0 lb

## 2021-10-11 DIAGNOSIS — E559 Vitamin D deficiency, unspecified: Secondary | ICD-10-CM

## 2021-10-11 DIAGNOSIS — E66811 Obesity, class 1: Secondary | ICD-10-CM

## 2021-10-11 DIAGNOSIS — E88819 Insulin resistance, unspecified: Secondary | ICD-10-CM

## 2021-10-11 DIAGNOSIS — Z79899 Other long term (current) drug therapy: Secondary | ICD-10-CM | POA: Diagnosis not present

## 2021-10-11 DIAGNOSIS — M059 Rheumatoid arthritis with rheumatoid factor, unspecified: Secondary | ICD-10-CM | POA: Diagnosis not present

## 2021-10-11 DIAGNOSIS — E669 Obesity, unspecified: Secondary | ICD-10-CM | POA: Diagnosis not present

## 2021-10-11 DIAGNOSIS — E8881 Metabolic syndrome: Secondary | ICD-10-CM | POA: Diagnosis not present

## 2021-10-11 DIAGNOSIS — Z6833 Body mass index (BMI) 33.0-33.9, adult: Secondary | ICD-10-CM

## 2021-10-12 ENCOUNTER — Encounter: Payer: Self-pay | Admitting: Physical Therapy

## 2021-10-12 ENCOUNTER — Ambulatory Visit (INDEPENDENT_AMBULATORY_CARE_PROVIDER_SITE_OTHER): Payer: Medicare Other | Admitting: Physical Therapy

## 2021-10-12 DIAGNOSIS — G8929 Other chronic pain: Secondary | ICD-10-CM

## 2021-10-12 DIAGNOSIS — M25561 Pain in right knee: Secondary | ICD-10-CM | POA: Diagnosis not present

## 2021-10-12 DIAGNOSIS — M6281 Muscle weakness (generalized): Secondary | ICD-10-CM | POA: Diagnosis not present

## 2021-10-12 DIAGNOSIS — R262 Difficulty in walking, not elsewhere classified: Secondary | ICD-10-CM | POA: Diagnosis not present

## 2021-10-12 NOTE — Therapy (Signed)
OUTPATIENT PHYSICAL THERAPY LOWER EXTREMITY TREATMENT    Patient Name: LORIANNE MALBROUGH MRN: [id number] DOB:04/30/1950, 71 y.o., female Today's Date: 10/12/2021   PT End of Session - 10/12/21 1108     Visit Number 5    Number of Visits 12    Date for PT Re-Evaluation 11/09/21    Authorization Type medicare, BCBS supplement    Progress Note Due on Visit 10    PT Start Time 1102    PT Stop Time 1143    PT Time Calculation (min) 41 min    Activity Tolerance Patient tolerated treatment well    Behavior During Therapy [facility] for tasks assessed/performed                Past Medical History:  Diagnosis Date   Allergic rhinitis    Allergy    Arthritis    Brain tumor ([place])    Breast cyst 2007   Right   Cataract    surgery 07/2017   Clotting disorder ([place])    On plavix since stents  for artery blockages   Depression 2006   w/ menopause   Family history of breast cancer    Family history of colon cancer    Family history of kidney cancer 01/15/2018   Family history of lung cancer    Family history of pancreatic cancer    Family history of prostate cancer    Family history of uterine cancer    GERD (gastroesophageal reflux disease)    Glaucoma    Heart murmur    History of hysterectomy, supracervical    Hyperlipidemia    Hypertension    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   MLH1 [id number] (Splice site)   Multinodular goiter    PAD (peripheral artery disease) ([place])    LE Art [place] 1/18: Occluded prox-mid R SFA // ABIs 9/22: R 0.83; L 1.0   Pustular psoriasis    Tobacco user    Past Surgical History:  Procedure Laterality Date   [place] TUMOR EXCISION  02/24/2021   at [place]     CATARACT EXTRACTION Right 07/2017   COLONOSCOPY     CORONARY STENT INTERVENTION N/A 12/26/2018   Procedure: CORONARY STENT INTERVENTION;  Surgeon: Belva Crome, MD;  Location: [place] [facility];   Service: Cardiovascular;  Laterality: N/A;   EYE SURGERY  2019, 2020   Cataracts, eye shunt implants   LEFT HEART CATH AND CORONARY ANGIOGRAPHY N/A 12/17/2018   Procedure: LEFT HEART CATH AND CORONARY ANGIOGRAPHY;  Surgeon: Belva Crome, MD;  Location: [place] [facility];  Service: Cardiovascular;  Laterality: N/A;   SUPRACERVICAL ABDOMINAL HYSTERECTOMY  1986   h/o supracervical hysterectomy   UPPER GASTROINTESTINAL ENDOSCOPY     Patient Active Problem List   Diagnosis Date Noted   Osteoarthritis 09/28/2021   Degeneration of lumbosacral intervertebral disc 09/28/2021   Elevated ALT measurement 09/28/2021   Other fatigue 09/27/2021   SOB (shortness of breath) on exertion 09/27/2021   Elevated glucose 09/27/2021   Health care maintenance 09/27/2021   Vitamin D deficiency 09/27/2021   Depression screening 09/27/2021   Class 1 obesity with serious comorbidity and body mass index (BMI) of 34.0 to 34.9 in adult 09/27/2021   Spondylosis without myelopathy 08/03/2021   Rheumatoid arthritis with rheumatoid factor ([place]) 08/03/2021   Psoriasis 08/03/2021   Hypopituitarism ([place]) 08/03/2021   Adrenal insufficiency ([place]) 08/03/2021   Gastroesophageal reflux  disease 07/26/2021   Meningioma ([place]) 03/21/2021   Visual field defect 03/21/2021   Chronic left shoulder pain 03/21/2021   Cervical radiculopathy 03/21/2021   History of pituitary surgery 03/06/2021   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   Pseudophakia of both eyes 06/29/2019   Steroid responders to glaucoma of both eyes 06/29/2019   S/P coronary artery stent placement    PAD (peripheral artery disease) (HCC)    CAD (coronary artery disease) 12/26/2018   Angina pectoris ([place])    Primary open-angle glaucoma 10/28/2018   Glaucoma associated with ocular inflammation 10/28/2018   Uveitic glaucoma of right eye, severe stage 10/28/2018   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 ([place])  08/21/2018   Aortic atherosclerosis ([place]) 08/19/2018   Uveitis 05/05/2018   Goiter 05/05/2018   Mixed hyperlipidemia 05/05/2018   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   Genetic testing 02/07/2018   Family history of kidney cancer 01/15/2018   Family history of pancreatic cancer    Family history of breast cancer    Family history of uterine cancer    Family history of colon cancer    Family history of prostate cancer    Family history of lung cancer    Hypertensive retinopathy of both eyes 11/12/2017   Iritis of right eye 11/12/2017   Nuclear sclerotic cataract of left eye 11/12/2017   Ocular hypertension of right eye 11/12/2017   Inflammation of eye, right 08/07/2017   Memory loss 09/21/2016   Essential hypertension 09/21/2016   Low vitamin D level 09/21/2016   DOE (dyspnea on exertion) 11/08/2010    PCP: Wendie Agreste, MD  REFERRING PROVIDER: Gregor Hams, MD  REFERRING DIAG: [id number] (ICD-10-CM) - Chronic pain of right knee M17.11 (ICD-10-CM) - Primary osteoarthritis of right knee  THERAPY DIAG:  Chronic pain of right knee  Muscle weakness (generalized)  Difficulty in walking, not elsewhere classified  Rationale for Evaluation and Treatment Rehabilitation  ONSET DATE: August 2023  SUBJECTIVE:   SUBJECTIVE STATEMENT: Pt states doing well. Most pain still with walking. Has been trying to walk more for exercise.   Eval: Last august she tore the meniscus in her right knee. States that her MD recommend a knee replacement. States she already had a brain tumor that needed to be removed and she removed in December. States that she tried PT but it didn't help. States that she then saw Dr. Noemi Chapel and he gave her a shot and she has been wearing a brace since. States that she now can walk with the injection and the brace. States that she was out walking and was rec to see Dr. Georgina Snell and they are starting the gel injections next Tuesday. States that she was able [place] which is the first time in 2 years. States she would like to get her knees stronger.   Patient reports she originally tore her meniscus when she rolled over in bed trying to do an exercise.  PERTINENT HISTORY: clotting issuses - on blood thinners, cardiac disease, history of meningioma.   PAIN:  Are you having pain? Yes: NPRS scale: 4-5/10 Pain location: right knee medial Pain description: sharp, grabbing Aggravating factors: walking,   Relieving factors: rest, brace, bands    WEIGHT BEARING RESTRICTIONS No  FALLS:  Has patient fallen in last 6 months? No   OCCUPATION: not currently working  PLOF: Independent  PATIENT GOALS to have less pain and to get  more active.    OBJECTIVE:   DIAGNOSTIC FINDINGS: previous imaging of knee demonstrated torn meniscus per patient    COGNITION:  Overall cognitive status: Within functional limits for tasks assessed     SENSATION: WFL  EDEMA:  Circumferential: left 37.0 cm, Right 38.5cm    PALPATION: Swelling throughout right leg, increased resting muscle tension in right leg, patella hypermobile laterally/medially but positioned superiorly    LE Measurements Lower Extremity Right 09/28/2021 Left 09/28/2021   A/PROM MMT A/PROM MMT  Hip Flexion  4  4  Hip Extension      Hip Abduction      Hip Adduction      Hip Internal rotation      Hip External rotation      Knee Flexion 130 4- 135 4-  Knee Extension 0 4 4 (hyper) 4  Ankle Dorsiflexion  5  5  Ankle Plantarflexion      Ankle Inversion      Ankle Eversion       (Blank rows = not tested)  * pain    GAIT Comments: with brace wears brace, limited knee extension, foot flat gait    TODAY'S TREATMENT:  10/12/21: Therapeutic Exercise: Aerobic: Recumbent bike L2 x 8 min  Supine:   Quad sets 3 sec holds x 20 bil;  SLR 2 x 10 bil;  Seated: LAQ 2.5  lb  2x 10 bil;    Standing: Marching x 20;  Step ups 4 in x 10 bil, 1 HR,  Stairs,  up/down 5 steps recipricol, 1 HR.  Neuromuscular Re-education:  L/R and A/P weight shifts x 20 ea on AirEx;  Tandem stance 30 sec x 2 bil;   Manual Therapy:  Modalities:    10/09/21: Therapeutic Exercise: Aerobic: Recumbent bike L1 x 7 min  Supine:   Quad sets 3 sec holds x 20 bil;  SLR 2 x 10 bil;  Seated: LAQ 2.5  lb  2x 10 bil;    Standing: Marching x 20;  HR x 20;  Neuromuscular Re-education:  L/R weight shifts x 20 ea on floor and on AirEx;   Staggered stance weight shifts fwd/bwd x 15 ea bil;  Manual Therapy:  Modalities:    PATIENT EDUCATION:  Education details: Reviewed HEP  Person educated: Patient Education method: Explanation, Demonstration, and Handouts Education comprehension: verbalized understanding   HOME EXERCISE PROGRAM: [id number]  ASSESSMENT:  CLINICAL IMPRESSION: 10/12/21: Pt doing well with ther ex and light strengthening. She has improving pain overall. Will benefit from continued strength for quad and LE, as well as functional mobility and stability.   Eval: Patient is a 71 y.o. female who was seen today for physical therapy evaluation and treatment for right knee pain that primarily hurts when the bone "shifts out of place." Patient presents with swelling, weakness and instability in right knee. Educated patient on signs and symptoms more consistent with patella instability vs. Meniscal tear but how both could be contributing to current presentation. Patient would greatly benefit from skilled PT to improve overall function and QOL.    OBJECTIVE IMPAIRMENTS Abnormal gait, decreased activity tolerance, decreased balance, decreased knowledge of use of DME, difficulty walking, decreased ROM, decreased strength, increased edema, and pain.   ACTIVITY LIMITATIONS lifting, standing, squatting, stairs, transfers, and locomotion level  PARTICIPATION LIMITATIONS: cleaning and community activity  PERSONAL FACTORS Age are also affecting patient's functional outcome.    REHAB POTENTIAL: Good  CLINICAL DECISION MAKING: Stable/uncomplicated  EVALUATION COMPLEXITY: Low  GOALS: Goals reviewed  with patient?  yes  SHORT TERM GOALS:  Patient will be independent in self management strategies to improve quality of life and functional outcomes. Baseline: new program Target date: 10/19/2021 Goal status: INITIAL  2.  Patient will report at least 50% improvement in overall symptoms and/or function to demonstrate improved functional mobility Baseline: 0% Target date: 10/19/2021 Goal status: INITIAL  3.  Patient will demonstrate swelling in legs within .5cm of each other to demonstrate reduced swelling. Baseline: see above Target date: 10/19/2021 Goal status: INITIAL     LONG TERM GOALS:  Patient will report at least 75% improvement in overall symptoms and/or function to demonstrate improved functional mobility Baseline: 0% Target date: 11/09/2021 Goal status: INITIAL  2.  Patient will be able to walk 1-2 miles without pain during or after to improve ability to walk her dog Baseline: unable Target date: 11/09/2021 Goal status: INITIAL  3.  Patent will demonstrate 4+/5 MMT in LE to demonstrate improved strength. Baseline: see above Target date: 11/09/2021 Goal status: INITIAL      PLAN: PT FREQUENCY: 2x/week  PT DURATION: 6 weeks  PLANNED INTERVENTIONS: Therapeutic exercises, Therapeutic activity, Neuromuscular re-education, Balance training, Gait training, Patient/Family education, Self Care, Joint mobilization, Joint manipulation, Stair training, Vestibular training, Orthotic/Fit training, DME instructions, Aquatic Therapy, Dry Needling, Electrical stimulation, Cryotherapy, Moist heat, Traction, Ultrasound, Ionotophoresis 34m/ml Dexamethasone, and Manual therapy  PLAN FOR NEXT SESSION: assess hip strength- strengthen hip/LE, STM as indicated, add standing balance/tandem stance.   LLyndee Hensen PT, DPT 11:09 AM  10/12/21

## 2021-10-16 ENCOUNTER — Encounter: Payer: Self-pay | Admitting: Physical Therapy

## 2021-10-16 ENCOUNTER — Ambulatory Visit (INDEPENDENT_AMBULATORY_CARE_PROVIDER_SITE_OTHER): Payer: Medicare Other | Admitting: Physical Therapy

## 2021-10-16 ENCOUNTER — Other Ambulatory Visit: Payer: Self-pay | Admitting: Interventional Cardiology

## 2021-10-16 DIAGNOSIS — M6281 Muscle weakness (generalized): Secondary | ICD-10-CM

## 2021-10-16 DIAGNOSIS — R262 Difficulty in walking, not elsewhere classified: Secondary | ICD-10-CM

## 2021-10-16 DIAGNOSIS — M25561 Pain in right knee: Secondary | ICD-10-CM | POA: Diagnosis not present

## 2021-10-16 DIAGNOSIS — G8929 Other chronic pain: Secondary | ICD-10-CM | POA: Diagnosis not present

## 2021-10-16 NOTE — Therapy (Signed)
OUTPATIENT PHYSICAL THERAPY LOWER EXTREMITY TREATMENT    Patient Name: Pamela Lowe MRN: [id number] DOB:[birth year], 71 y.o., female Today's Date: 10/12/2021   PT End of Session - 10/16/21 0936     Visit Number 6    Number of Visits 12    Date for PT Re-Evaluation 11/09/21    Authorization Type medicare, BCBS supplement    Progress Note Due on Visit 10    PT Start Time [phone]    PT Stop Time 1015    PT Time Calculation (min) 38 min    Activity Tolerance Patient tolerated treatment well    Behavior During Therapy [facility] for tasks assessed/performed                Past Medical History:  Diagnosis Date   Allergic rhinitis    Allergy    Arthritis    Brain tumor ([place])    Breast cyst 2007   Right   Cataract    surgery 07/2017   Clotting disorder ([place])    On plavix since stents  for artery blockages   Depression 2006   w/ menopause   Family history of breast cancer    Family history of colon cancer    Family history of kidney cancer 01/15/2018   Family history of lung cancer    Family history of pancreatic cancer    Family history of prostate cancer    Family history of uterine cancer    GERD (gastroesophageal reflux disease)    Glaucoma    Heart murmur    History of hysterectomy, supracervical    Hyperlipidemia    Hypertension    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   MLH1 [id number] (Splice site)   Multinodular goiter    PAD (peripheral artery disease) ([place])    LE Art [place] 1/18: Occluded prox-mid R SFA // ABIs 9/22: R 0.83; L 1.0   Pustular psoriasis    Tobacco user    Past Surgical History:  Procedure Laterality Date   ABDOMINAL HYSTERECTOMY  1995   BRAIN TUMOR EXCISION  02/24/2021   at [place]     CATARACT EXTRACTION Right 07/2017   COLONOSCOPY     CORONARY STENT INTERVENTION N/A 12/26/2018   Procedure: CORONARY STENT INTERVENTION;  Surgeon: Belva Crome, MD;  Location: [place] [facility];   Service: Cardiovascular;  Laterality: N/A;   EYE SURGERY  2019, 2020   Cataracts, eye shunt implants   LEFT HEART CATH AND CORONARY ANGIOGRAPHY N/A 12/17/2018   Procedure: LEFT HEART CATH AND CORONARY ANGIOGRAPHY;  Surgeon: Belva Crome, MD;  Location: [place] [facility];  Service: Cardiovascular;  Laterality: N/A;   SUPRACERVICAL ABDOMINAL HYSTERECTOMY  1986   h/o supracervical hysterectomy   UPPER GASTROINTESTINAL ENDOSCOPY     Patient Active Problem List   Diagnosis Date Noted   Osteoarthritis 09/28/2021   Degeneration of lumbosacral intervertebral disc 09/28/2021   Elevated ALT measurement 09/28/2021   Other fatigue 09/27/2021   SOB (shortness of breath) on exertion 09/27/2021   Elevated glucose 09/27/2021   Health care maintenance 09/27/2021   Vitamin D deficiency 09/27/2021   Depression screening 09/27/2021   Class 1 obesity with serious comorbidity and body mass index (BMI) of 34.0 to 34.9 in adult 09/27/2021   Spondylosis without myelopathy 08/03/2021   Rheumatoid arthritis with rheumatoid factor ([place]) 08/03/2021   Psoriasis 08/03/2021   Hypopituitarism ([employer]) 08/03/2021   Adrenal insufficiency ([place]) 08/03/2021   Gastroesophageal reflux  disease 07/26/2021   Meningioma ([place]) 03/21/2021   Visual field defect 03/21/2021   Chronic left shoulder pain 03/21/2021   Cervical radiculopathy 03/21/2021   History of pituitary surgery 03/06/2021   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   Pseudophakia of both eyes 06/29/2019   Steroid responders to glaucoma of both eyes 06/29/2019   S/P coronary artery stent placement    PAD (peripheral artery disease) (HCC)    CAD (coronary artery disease) 12/26/2018   Angina pectoris ([place])    Primary open-angle glaucoma 10/28/2018   Glaucoma associated with ocular inflammation 10/28/2018   Uveitic glaucoma of right eye, severe stage 10/28/2018   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 ([place])  08/21/2018   Aortic atherosclerosis ([place]) 08/19/2018   Uveitis 05/05/2018   Goiter 05/05/2018   Mixed hyperlipidemia 05/05/2018   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   Genetic testing 02/07/2018   Family history of kidney cancer 01/15/2018   Family history of pancreatic cancer    Family history of breast cancer    Family history of uterine cancer    Family history of colon cancer    Family history of prostate cancer    Family history of lung cancer    Hypertensive retinopathy of both eyes 11/12/2017   Iritis of right eye 11/12/2017   Nuclear sclerotic cataract of left eye 11/12/2017   Ocular hypertension of right eye 11/12/2017   Inflammation of eye, right 08/07/2017   Memory loss 09/21/2016   Essential hypertension 09/21/2016   Low vitamin D level 09/21/2016   DOE (dyspnea on exertion) 11/08/2010    PCP: Wendie Agreste, MD  REFERRING PROVIDER: Gregor Hams, MD  REFERRING DIAG: [phone] (ICD-10-CM) - Chronic pain of right knee M17.11 (ICD-10-CM) - Primary osteoarthritis of right knee  THERAPY DIAG:  Chronic pain of right knee  Muscle weakness (generalized)  Difficulty in walking, not elsewhere classified  Rationale for Evaluation and Treatment Rehabilitation  ONSET DATE: August 2023  SUBJECTIVE:   SUBJECTIVE STATEMENT: Pt state she is a little sore today as she was doing a lot and forgot her brace yesterday. States she was very busy this weekend  Eval: Last august she tore the meniscus in her right knee. States that her MD recommend a knee replacement. States she already had a brain tumor that needed to be removed and she removed in December. States that she tried PT but it didn't help. States that she then saw Dr. Noemi Chapel and he gave her a shot and she has been wearing a brace since. States that she now can walk with the injection and the brace. States that she was out walking and was rec to see Dr. Georgina Snell and they are starting the gel injections next Tuesday. States  that she was able [place] walk [place] which is the first time in 2 years. States she would like to get her knees stronger.   Patient reports she originally tore her meniscus when she rolled over in bed trying to do an exercise.  PERTINENT HISTORY: clotting issuses - on blood thinners, cardiac disease, history of meningioma.   PAIN:  Are you having pain? Yes: NPRS scale: 43/10 Pain location: right knee medial Pain description: soreness Aggravating factors: walking,   Relieving factors: rest, brace, bands    WEIGHT BEARING RESTRICTIONS No  FALLS:  Has patient fallen in last 6 months? No   OCCUPATION: not currently working  PLOF: Independent  PATIENT GOALS  to have less pain and to get more active.    OBJECTIVE:   DIAGNOSTIC FINDINGS: previous imaging of knee demonstrated torn meniscus per patient    COGNITION:  Overall cognitive status: Within functional limits for tasks assessed     SENSATION: WFL  EDEMA:  Circumferential: left 37.0 cm, Right 38.5cm    PALPATION: Swelling throughout right leg, increased resting muscle tension in right leg, patella hypermobile laterally/medially but positioned superiorly    LE Measurements Lower Extremity Right 09/28/2021 Left 09/28/2021   A/PROM MMT A/PROM MMT  Hip Flexion  4  4  Hip Extension      Hip Abduction      Hip Adduction      Hip Internal rotation      Hip External rotation      Knee Flexion 130 4- 135 4-  Knee Extension 0 4 4 (hyper) 4  Ankle Dorsiflexion  5  5  Ankle Plantarflexion      Ankle Inversion      Ankle Eversion       (Blank rows = not tested)  * pain    GAIT Comments: with brace wears brace, limited knee extension, foot flat gait    TODAY'S TREATMENT:  10/16/2021 Therapeutic Exercise: Aerobic: Recumbent bike L2 x 7 min  Supine:  bridge 3x10 5" holds, SLR 3 x 10 bil;  Prone: hamstring curl 3x10 B, hip exten 4x5 B  Seated: LAQ 2.5  lb  2x 10 5" bil;      Standing:   Neuromuscular Re-education:   Manual Therapy:  Modalities:    10/09/21: Therapeutic Exercise: Aerobic: Recumbent bike L1 x 7 min  Supine:   Quad sets 3 sec holds x 20 bil;  SLR 2 x 10 bil;  Seated: LAQ 2.5  lb  2x 10 bil;    Standing: Marching x 20;  HR x 20;  Neuromuscular Re-education:  L/R weight shifts x 20 ea on floor and on AirEx;   Staggered stance weight shifts fwd/bwd x 15 ea bil;  Manual Therapy:  Modalities:    PATIENT EDUCATION:  Education details: Reviewed HEP  Person educated: Patient Education method: Explanation, Demonstration, and Handouts Education comprehension: verbalized understanding   HOME EXERCISE PROGRAM: [id number]  ASSESSMENT:  CLINICAL IMPRESSION: 10/16/2021  Session focused on education and progression of exercises. Added prone exercises today which were tolerated well. No Pain noted in knee during exercises or at end of session. Added new exercises to HEP. Will continue with current POC.  Eval: Patient is a 71 y.o. female who was seen today for physical therapy evaluation and treatment for right knee pain that primarily hurts when the bone "shifts out of place." Patient presents with swelling, weakness and instability in right knee. Educated patient on signs and symptoms more consistent with patella instability vs. Meniscal tear but how both could be contributing to current presentation. Patient would greatly benefit from skilled PT to improve overall function and QOL.    OBJECTIVE IMPAIRMENTS Abnormal gait, decreased activity tolerance, decreased balance, decreased knowledge of use of DME, difficulty walking, decreased ROM, decreased strength, increased edema, and pain.   ACTIVITY LIMITATIONS lifting, standing, squatting, stairs, transfers, and locomotion level  PARTICIPATION LIMITATIONS: cleaning and community activity  PERSONAL FACTORS Age are also affecting patient's functional outcome.   REHAB POTENTIAL: Good  CLINICAL  DECISION MAKING: Stable/uncomplicated  EVALUATION COMPLEXITY: Low  GOALS: Goals reviewed with patient?  yes  SHORT TERM GOALS:  Patient will be independent in self management strategies to  improve quality of life and functional outcomes. Baseline: new program Target date: 10/19/2021 Goal status: INITIAL  2.  Patient will report at least 50% improvement in overall symptoms and/or function to demonstrate improved functional mobility Baseline: 0% Target date: 10/19/2021 Goal status: INITIAL  3.  Patient will demonstrate swelling in legs within .5cm of each other to demonstrate reduced swelling. Baseline: see above Target date: 10/19/2021 Goal status: INITIAL     LONG TERM GOALS:  Patient will report at least 75% improvement in overall symptoms and/or function to demonstrate improved functional mobility Baseline: 0% Target date: 11/09/2021 Goal status: INITIAL  2.  Patient will be able to walk 1-2 miles without pain during or after to improve ability to walk her dog Baseline: unable Target date: 11/09/2021 Goal status: INITIAL  3.  Patent will demonstrate 4+/5 MMT in LE to demonstrate improved strength. Baseline: see above Target date: 11/09/2021 Goal status: INITIAL      PLAN: PT FREQUENCY: 2x/week  PT DURATION: 6 weeks  PLANNED INTERVENTIONS: Therapeutic exercises, Therapeutic activity, Neuromuscular re-education, Balance training, Gait training, Patient/Family education, Self Care, Joint mobilization, Joint manipulation, Stair training, Vestibular training, Orthotic/Fit training, DME instructions, Aquatic Therapy, Dry Needling, Electrical stimulation, Cryotherapy, Moist heat, Traction, Ultrasound, Ionotophoresis 55m/ml Dexamethasone, and Manual therapy  PLAN FOR NEXT SESSION: assess hip strength- strengthen hip/LE, STM as indicated, add standing balance/tandem stance.   10:16 AM, 10/16/21 MJerene Pitch DPT Physical Therapy with [facility]

## 2021-10-17 ENCOUNTER — Ambulatory Visit (INDEPENDENT_AMBULATORY_CARE_PROVIDER_SITE_OTHER): Payer: Medicare Other | Admitting: Family Medicine

## 2021-10-17 ENCOUNTER — Ambulatory Visit: Payer: Self-pay

## 2021-10-17 DIAGNOSIS — M1711 Unilateral primary osteoarthritis, right knee: Secondary | ICD-10-CM

## 2021-10-17 DIAGNOSIS — G8929 Other chronic pain: Secondary | ICD-10-CM | POA: Diagnosis not present

## 2021-10-17 DIAGNOSIS — M25561 Pain in right knee: Secondary | ICD-10-CM | POA: Diagnosis not present

## 2021-10-17 MED ORDER — HYALURONAN 30 MG/2ML IX SOSY
30.0000 mg | PREFILLED_SYRINGE | Freq: Once | INTRA_ARTICULAR | Status: AC
  Administered 2021-10-17: 30 mg via INTRA_ARTICULAR

## 2021-10-17 NOTE — Patient Instructions (Signed)
Thank you for coming in today.   You received an injection today. Seek immediate medical attention if the joint becomes red, extremely painful, or is oozing fluid.   Check back as needed 

## 2021-10-17 NOTE — Progress Notes (Signed)
Pamela Lowe presents to clinic today for Orthovisc injection right knee 3/3  Procedure: Real-time Ultrasound Guided Injection of right knee superior lateral patellar space Device: Philips Affiniti 50G Images permanently stored and available for review in PACS Verbal informed consent obtained.  Discussed risks and benefits of procedure. Warned about infection, bleeding, damage to structures among others. Patient expresses understanding and agreement Time-out conducted.   Noted no overlying erythema, induration, or other signs of local infection.   Skin prepped in a sterile fashion.   Local anesthesia: Topical Ethyl chloride.   With sterile technique and under real time ultrasound guidance: Orthovisc 30 mg injected into knee joint. Fluid seen entering the joint capsule.   Completed without difficulty   Advised to call if fevers/chills, erythema, induration, drainage, or persistent bleeding.   Images permanently stored and available for review in the ultrasound unit.  Impression: Technically successful ultrasound guided injection.  Lot number [id number]  Return as needed

## 2021-10-19 ENCOUNTER — Ambulatory Visit (INDEPENDENT_AMBULATORY_CARE_PROVIDER_SITE_OTHER): Payer: Medicare Other | Admitting: Physical Therapy

## 2021-10-19 ENCOUNTER — Encounter: Payer: Self-pay | Admitting: Physical Therapy

## 2021-10-19 DIAGNOSIS — G8929 Other chronic pain: Secondary | ICD-10-CM

## 2021-10-19 DIAGNOSIS — R262 Difficulty in walking, not elsewhere classified: Secondary | ICD-10-CM

## 2021-10-19 DIAGNOSIS — M25561 Pain in right knee: Secondary | ICD-10-CM

## 2021-10-19 DIAGNOSIS — M6281 Muscle weakness (generalized): Secondary | ICD-10-CM

## 2021-10-19 NOTE — Therapy (Signed)
OUTPATIENT PHYSICAL THERAPY LOWER EXTREMITY TREATMENT    Patient Name: Pamela Lowe MRN: [id number] DOB:[birth year], 71 y.o., female Today's Date: 10/19/2021   PT End of Session - 10/19/21 1534     Visit Number 7    Number of Visits 12    Date for PT Re-Evaluation 11/09/21    Authorization Type medicare, BCBS supplement    Progress Note Due on Visit 10    PT Start Time 1518    PT Stop Time 1600    PT Time Calculation (min) 42 min    Activity Tolerance Patient tolerated treatment well    Behavior During Therapy [facility] for tasks assessed/performed                 Past Medical History:  Diagnosis Date   Allergic rhinitis    Allergy    Arthritis    Brain tumor ([place])    Breast cyst 2007   Right   Cataract    surgery 07/2017   Clotting disorder ([place])    On plavix since stents  for artery blockages   Depression 2006   w/ menopause   Family history of breast cancer    Family history of colon cancer    Family history of kidney cancer 01/15/2018   Family history of lung cancer    Family history of pancreatic cancer    Family history of prostate cancer    Family history of uterine cancer    GERD (gastroesophageal reflux disease)    Glaucoma    Heart murmur    History of hysterectomy, supracervical    Hyperlipidemia    Hypertension    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   MLH1 [id number] (Splice site)   Multinodular goiter    PAD (peripheral artery disease) ([place])    LE Art [place] 1/18: Occluded prox-mid R SFA // ABIs 9/22: R 0.83; L 1.0   Pustular psoriasis    Tobacco user    Past Surgical History:  Procedure Laterality Date   [place] TUMOR EXCISION  02/24/2021   at [place]     CATARACT EXTRACTION Right 07/2017   COLONOSCOPY     CORONARY STENT INTERVENTION N/A 12/26/2018   Procedure: CORONARY STENT INTERVENTION;  Surgeon: Belva Crome, MD;  Location: [place] [facility];   Service: Cardiovascular;  Laterality: N/A;   EYE SURGERY  2019, 2020   Cataracts, eye shunt implants   LEFT HEART CATH AND CORONARY ANGIOGRAPHY N/A 12/17/2018   Procedure: LEFT HEART CATH AND CORONARY ANGIOGRAPHY;  Surgeon: Belva Crome, MD;  Location: [facility];  Service: Cardiovascular;  Laterality: N/A;   SUPRACERVICAL ABDOMINAL HYSTERECTOMY  1986   h/o supracervical hysterectomy   UPPER GASTROINTESTINAL ENDOSCOPY     Patient Active Problem List   Diagnosis Date Noted   Osteoarthritis 09/28/2021   Degeneration of lumbosacral intervertebral disc 09/28/2021   Elevated ALT measurement 09/28/2021   Other fatigue 09/27/2021   SOB (shortness of breath) on exertion 09/27/2021   Elevated glucose 09/27/2021   Health care maintenance 09/27/2021   Vitamin D deficiency 09/27/2021   Depression screening 09/27/2021   Class 1 obesity with serious comorbidity and body mass index (BMI) of 34.0 to 34.9 in adult 09/27/2021   Spondylosis without myelopathy 08/03/2021   Rheumatoid arthritis with rheumatoid factor ([place]) 08/03/2021   Psoriasis 08/03/2021   Hypopituitarism ([place]) 08/03/2021   Adrenal insufficiency ([place]) 08/03/2021   Gastroesophageal  reflux disease 07/26/2021   Meningioma ([place]) 03/21/2021   Visual field defect 03/21/2021   Chronic left shoulder pain 03/21/2021   Cervical radiculopathy 03/21/2021   History of pituitary surgery 03/06/2021   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   Pseudophakia of both eyes 06/29/2019   Steroid responders to glaucoma of both eyes 06/29/2019   S/P coronary artery stent placement    PAD (peripheral artery disease) ([facility])    CAD (coronary artery disease) 12/26/2018   Angina pectoris ([place])    Primary open-angle glaucoma 10/28/2018   Glaucoma associated with ocular inflammation 10/28/2018   Uveitic glaucoma of right eye, severe stage 10/28/2018   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 ([place])  08/21/2018   Aortic atherosclerosis ([place]) 08/19/2018   Uveitis 05/05/2018   Goiter 05/05/2018   Mixed hyperlipidemia 05/05/2018   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   Genetic testing 02/07/2018   Family history of kidney cancer 01/15/2018   Family history of pancreatic cancer    Family history of breast cancer    Family history of uterine cancer    Family history of colon cancer    Family history of prostate cancer    Family history of lung cancer    Hypertensive retinopathy of both eyes 11/12/2017   Iritis of right eye 11/12/2017   Nuclear sclerotic cataract of left eye 11/12/2017   Ocular hypertension of right eye 11/12/2017   Inflammation of eye, right 08/07/2017   Memory loss 09/21/2016   Essential hypertension 09/21/2016   Low vitamin D level 09/21/2016   DOE (dyspnea on exertion) 11/08/2010    PCP: Wendie Agreste, MD  REFERRING PROVIDER: Gregor Hams, MD  REFERRING DIAG: [phone] (ICD-10-CM) - Chronic pain of right knee M17.11 (ICD-10-CM) - Primary osteoarthritis of right knee  THERAPY DIAG:  Chronic pain of right knee  Muscle weakness (generalized)  Difficulty in walking, not elsewhere classified  Rationale for Evaluation and Treatment Rehabilitation  ONSET DATE: August 2023  SUBJECTIVE:   SUBJECTIVE STATEMENT: Pt state she is a little sore today, switched shoes. Also got last gel injection.   Eval: Last august she tore the meniscus in her right knee. States that her MD recommend a knee replacement. States she already had a brain tumor that needed to be removed and she removed in December. States that she tried PT but it didn't help. States that she then saw Dr. Noemi Chapel and he gave her a shot and she has been wearing a brace since. States that she now can walk with the injection and the brace. States that she was out walking and was rec to see Dr. Georgina Snell and they are starting the gel injections next Tuesday. States that she was able to walk all the way around  [place] other [place] which is the first time in 2 years. States she would like to get her knees stronger.   Patient reports she originally tore her meniscus when she rolled over in bed trying to do an exercise.  PERTINENT HISTORY: clotting issuses - on blood thinners, cardiac disease, history of meningioma.   PAIN:  Are you having pain? Yes: NPRS scale: 4/10 Pain location: right knee medial Pain description: soreness Aggravating factors: walking,   Relieving factors: rest, brace, bands    WEIGHT BEARING RESTRICTIONS No  FALLS:  Has patient fallen in last 6 months? No   OCCUPATION: not currently working  PLOF: Independent  PATIENT GOALS to have less pain and to get more active.  OBJECTIVE:   DIAGNOSTIC FINDINGS: previous imaging of knee demonstrated torn meniscus per patient    COGNITION:  Overall cognitive status: Within functional limits for tasks assessed     SENSATION: WFL  EDEMA:  Circumferential: left 37.0 cm, Right 38.5cm    PALPATION: Swelling throughout right leg, increased resting muscle tension in right leg, patella hypermobile laterally/medially but positioned superiorly    LE Measurements Lower Extremity Right 09/28/2021 Left 09/28/2021   A/PROM MMT A/PROM MMT  Hip Flexion  4  4  Hip Extension      Hip Abduction      Hip Adduction      Hip Internal rotation      Hip External rotation      Knee Flexion 130 4- 135 4-  Knee Extension 0 4 4 (hyper) 4  Ankle Dorsiflexion  5  5  Ankle Plantarflexion      Ankle Inversion      Ankle Eversion       (Blank rows = not tested)  * pain    GAIT Comments: with brace wears brace, limited knee extension, foot flat gait    TODAY'S TREATMENT:  10/19/2021 Therapeutic Exercise: Aerobic: Recumbent bike L2 x 8 min  Supine:  bridge 3x10 5" holds, SLR 3 x 10 bil;  Prone:  Seated: LAQ 2.5  lb  2x 10 5" bil;    Standing:  Marching, HR, hip abd 2x10 bil;  Neuromuscular Re-education:   Manual  Therapy:  Modalities:  Self Care: Education on footwear, optimal foot position, and discussion of which orthotics to use in different shoes. Pt brought shoes and orthotics in today to assess.    Previous:  Supine:  bridge 3x10 5" holds, SLR 3 x 10 bil;  Prone: hamstring curl 3x10 B, hip exten 4x5 B  Seated: LAQ 2.5  lb  2x 10 5" bil;    Standing: , HR, hip abd 2x10 bil;    PATIENT EDUCATION:  Education details: Reviewed HEP  Person educated: Patient Education method: Explanation, Demonstration, and Handouts Education comprehension: verbalized understanding   HOME EXERCISE PROGRAM: [id number]  ASSESSMENT:  CLINICAL IMPRESSION: 10/19/2021  Pt improving with strengthening and ability for functional mobility. She has slight soreness at medial knee today, has been doing a lot of standing activity this week. She switched shoes, foot position does look better, less over -correction from previous shoes, but if they are increasing knee pain overall, she will switch back.Pt progressing very well, will progress strength and mobility  as tolerated.   Eval: Patient is a 71 y.o. female who was seen today for physical therapy evaluation and treatment for right knee pain that primarily hurts when the bone "shifts out of place." Patient presents with swelling, weakness and instability in right knee. Educated patient on signs and symptoms more consistent with patella instability vs. Meniscal tear but how both could be contributing to current presentation. Patient would greatly benefit from skilled PT to improve overall function and QOL.    OBJECTIVE IMPAIRMENTS Abnormal gait, decreased activity tolerance, decreased balance, decreased knowledge of use of DME, difficulty walking, decreased ROM, decreased strength, increased edema, and pain.   ACTIVITY LIMITATIONS lifting, standing, squatting, stairs, transfers, and locomotion level  PARTICIPATION LIMITATIONS: cleaning and community activity  PERSONAL  FACTORS Age are also affecting patient's functional outcome.   REHAB POTENTIAL: Good  CLINICAL DECISION MAKING: Stable/uncomplicated  EVALUATION COMPLEXITY: Low  GOALS: Goals reviewed with patient?  yes  SHORT TERM GOALS:  Patient will be independent in  self management strategies to improve quality of life and functional outcomes. Baseline: new program Target date: 10/19/2021 Goal status: INITIAL  2.  Patient will report at least 50% improvement in overall symptoms and/or function to demonstrate improved functional mobility Baseline: 0% Target date: 10/19/2021 Goal status: INITIAL  3.  Patient will demonstrate swelling in legs within .5cm of each other to demonstrate reduced swelling. Baseline: see above Target date: 10/19/2021 Goal status: INITIAL     LONG TERM GOALS:  Patient will report at least 75% improvement in overall symptoms and/or function to demonstrate improved functional mobility Baseline: 0% Target date: 11/09/2021 Goal status: INITIAL  2.  Patient will be able to walk 1-2 miles without pain during or after to improve ability to walk her dog Baseline: unable Target date: 11/09/2021 Goal status: INITIAL  3.  Patent will demonstrate 4+/5 MMT in LE to demonstrate improved strength. Baseline: see above Target date: 11/09/2021 Goal status: INITIAL      PLAN: PT FREQUENCY: 2x/week  PT DURATION: 6 weeks  PLANNED INTERVENTIONS: Therapeutic exercises, Therapeutic activity, Neuromuscular re-education, Balance training, Gait training, Patient/Family education, Self Care, Joint mobilization, Joint manipulation, Stair training, Vestibular training, Orthotic/Fit training, DME instructions, Aquatic Therapy, Dry Needling, Electrical stimulation, Cryotherapy, Moist heat, Traction, Ultrasound, Ionotophoresis 36m/ml Dexamethasone, and Manual therapy  PLAN FOR NEXT SESSION: assess hip strength- strengthen hip/LE, STM as indicated, add standing balance/tandem stance.    8:54 PM, 10/19/21 MJerene Pitch DPT [facility]

## 2021-10-23 ENCOUNTER — Encounter (INDEPENDENT_AMBULATORY_CARE_PROVIDER_SITE_OTHER): Payer: Self-pay | Admitting: Bariatrics

## 2021-10-23 NOTE — Progress Notes (Signed)
Chief Complaint:   OBESITY Pamela Lowe is here to discuss her progress with her obesity treatment plan along with follow-up of her obesity related diagnoses. Pamela Lowe is on the Category 1 Plan and states she is following her eating plan approximately 90% of the time. Pamela Lowe states she is walking, rehab, and riding stationary bike for 45 minutes 3 times per week.  Today's visit was #: 2 Starting weight: 213 lbs Starting date: 09/27/2021 Today's weight: 206 lbs Today's date: 10/11/2021 Total lbs lost to date: 7 Total lbs lost since last in-office visit: 7  Interim History: Pamela Lowe is down 7 pounds since her first visit.  She is enjoying the plan.  She has not been hungry.  Subjective:   1. Vitamin D insufficiency Pamela Lowe is taking calcium and vitamin D.  2. Insulin resistance Pamela Lowe is not on medications currently.  A1c is 5.6 and insulin 12.2.  Assessment/Plan:   1. Vitamin D insufficiency Pamela Lowe will continue vitamin D 1000 units once daily.  She will be out in the sun more.  2. Insulin resistance Handouts on insulin resistance and prediabetes was given to the patient today.  3. Obesity, current BMI-33.4 Pamela Lowe is currently in the action stage of change. As such, her goal is to continue with weight loss efforts. She has agreed to the Category 1 Plan.   Meal planning and intentional eating were discussed.  Reviewed labs from 09/27/2021 with the patient today, CMP, lipids, vitamin D, A1c, insulin, and thyroid panel.  Exercise goals: As is.   Behavioral modification strategies: increasing lean protein intake, decreasing simple carbohydrates, increasing vegetables, increasing water intake, decreasing eating out, no skipping meals, meal planning and cooking strategies, keeping healthy foods in the home, and planning for success.  Pamela Lowe has agreed to follow-up with our clinic in 2 to 3 weeks. She was informed of the importance of frequent follow-up visits to maximize her success with  intensive lifestyle modifications for her multiple health conditions.   Objective:   Blood pressure 109/70, pulse 60, temperature 98.6 F (37 C), height '5\' 6"'$  (1.676 m), weight 206 lb (93.4 kg), last menstrual period 03/19/1998, SpO2 98 %. Body mass index is 33.25 kg/m.  General: Cooperative, alert, well developed, in no acute distress. HEENT: Conjunctivae and lids unremarkable. Cardiovascular: Regular rhythm.  Lungs: Normal work of breathing. Neurologic: No focal deficits.   Lab Results  Component Value Date   CREATININE 1.03 (H) 09/27/2021   BUN 13 09/27/2021   NA 140 09/27/2021   K 4.2 09/27/2021   CL 103 09/27/2021   CO2 24 09/27/2021   Lab Results  Component Value Date   ALT 33 (H) 09/27/2021   AST 36 09/27/2021   ALKPHOS 65 09/27/2021   BILITOT 0.8 09/27/2021   Lab Results  Component Value Date   HGBA1C 5.6 09/27/2021   HGBA1C 5.7 07/26/2021   HGBA1C 5.8 (A) 04/05/2021   Lab Results  Component Value Date   INSULIN 12.2 09/27/2021   Lab Results  Component Value Date   TSH 0.823 09/27/2021   Lab Results  Component Value Date   CHOL 148 09/27/2021   HDL 83 09/27/2021   LDLCALC 49 09/27/2021   TRIG 85 09/27/2021   CHOLHDL 1.9 05/29/2021   Lab Results  Component Value Date   VD25OH 30.3 09/27/2021   VD25OH 40.6 08/10/2021   VD25OH 41.0 09/21/2016   Lab Results  Component Value Date   WBC 5.7 09/14/2021   HGB 13.0 09/14/2021   HCT 40.4  09/14/2021   MCV 93.7 09/14/2021   PLT 297.0 09/14/2021   No results found for: "IRON", "TIBC", "FERRITIN"  Attestation Statements:   Reviewed by clinician on day of visit: allergies, medications, problem list, medical history, surgical history, family history, social history, and previous encounter notes.   Wilhemena Durie, am acting as Location manager for [employer], DO.  I have reviewed the above documentation for accuracy and completeness, and I agree with the above. Jearld Lesch, DO

## 2021-10-24 ENCOUNTER — Encounter: Payer: Medicare Other | Admitting: Physical Therapy

## 2021-10-24 ENCOUNTER — Encounter: Payer: Self-pay | Admitting: Physical Therapy

## 2021-10-24 ENCOUNTER — Ambulatory Visit (INDEPENDENT_AMBULATORY_CARE_PROVIDER_SITE_OTHER): Payer: Medicare Other | Admitting: Physical Therapy

## 2021-10-24 DIAGNOSIS — M6281 Muscle weakness (generalized): Secondary | ICD-10-CM | POA: Diagnosis not present

## 2021-10-24 DIAGNOSIS — R262 Difficulty in walking, not elsewhere classified: Secondary | ICD-10-CM

## 2021-10-24 DIAGNOSIS — G8929 Other chronic pain: Secondary | ICD-10-CM | POA: Diagnosis not present

## 2021-10-24 DIAGNOSIS — M25561 Pain in right knee: Secondary | ICD-10-CM | POA: Diagnosis not present

## 2021-10-24 NOTE — Therapy (Signed)
OUTPATIENT PHYSICAL THERAPY LOWER EXTREMITY TREATMENT and Progress note  Progress Note Reporting Period 09/28/21 to 10/24/21  See note below for Objective Data and Assessment of Progress/Goals.     Patient Name: Pamela Lowe MRN: [id number] DOB:Feb 26, 1951, 71 y.o., female Today's Date: 10/19/2021   PT End of Session - 10/24/21 1516     Visit Number 8    Number of Visits 12    Date for PT Re-Evaluation 11/09/21    Authorization Type medicare, BCBS supplement    Progress Note Due on Visit 18    PT Start Time 1517    PT Stop Time 1555    PT Time Calculation (min) 38 min    Activity Tolerance Patient tolerated treatment well    Behavior During Therapy [facility] for tasks assessed/performed                 Past Medical History:  Diagnosis Date   Allergic rhinitis    Allergy    Arthritis    Brain tumor ([place])    Breast cyst 2007   Right   Cataract    surgery 07/2017   Clotting disorder ([place])    On plavix since stents  for artery blockages   Depression 2006   w/ menopause   Family history of breast cancer    Family history of colon cancer    Family history of kidney cancer 01/15/2018   Family history of lung cancer    Family history of pancreatic cancer    Family history of prostate cancer    Family history of uterine cancer    GERD (gastroesophageal reflux disease)    Glaucoma    Heart murmur    History of hysterectomy, supracervical    Hyperlipidemia    Hypertension    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   MLH1 [id number] (Splice site)   Multinodular goiter    PAD (peripheral artery disease) ([place])    LE Art [place] 1/18: Occluded prox-mid R SFA // ABIs 9/22: R 0.83; L 1.0   Pustular psoriasis    Tobacco user    Past Surgical History:  Procedure Laterality Date   [place] TUMOR EXCISION  02/24/2021   at [place]     CATARACT EXTRACTION Right 07/2017   COLONOSCOPY      CORONARY STENT INTERVENTION N/A 12/26/2018   Procedure: CORONARY STENT INTERVENTION;  Surgeon: Belva Crome, MD;  Location: [place];  Service: Cardiovascular;  Laterality: N/A;   EYE SURGERY  2019, 2020   Cataracts, eye shunt implants   LEFT HEART CATH AND CORONARY ANGIOGRAPHY N/A 12/17/2018   Procedure: LEFT HEART CATH AND CORONARY ANGIOGRAPHY;  Surgeon: Belva Crome, MD;  Location: [place] [facility];  Service: Cardiovascular;  Laterality: N/A;   SUPRACERVICAL ABDOMINAL HYSTERECTOMY  1986   h/o supracervical hysterectomy   UPPER GASTROINTESTINAL ENDOSCOPY     Patient Active Problem List   Diagnosis Date Noted   Osteoarthritis 09/28/2021   Degeneration of lumbosacral intervertebral disc 09/28/2021   Elevated ALT measurement 09/28/2021   Other fatigue 09/27/2021   SOB (shortness of breath) on exertion 09/27/2021   Elevated glucose 09/27/2021   Health care maintenance 09/27/2021   Vitamin D deficiency 09/27/2021   Depression screening 09/27/2021   Class 1 obesity with serious comorbidity and body mass index (BMI) of 34.0 to 34.9 in adult 09/27/2021   Spondylosis without myelopathy 08/03/2021   Rheumatoid arthritis  with rheumatoid factor ([place]) 08/03/2021   Psoriasis 08/03/2021   Hypopituitarism ([place]) 08/03/2021   Adrenal insufficiency ([place]) 08/03/2021   Gastroesophageal reflux disease 07/26/2021   Meningioma ([place]) 03/21/2021   Visual field defect 03/21/2021   Chronic left shoulder pain 03/21/2021   Cervical radiculopathy 03/21/2021   History of pituitary surgery 03/06/2021   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   Pseudophakia of both eyes 06/29/2019   Steroid responders to glaucoma of both eyes 06/29/2019   S/P coronary artery stent placement    PAD (peripheral artery disease) ([facility])    CAD (coronary artery disease) 12/26/2018   Angina pectoris ([place])    Primary open-angle glaucoma 10/28/2018   Glaucoma associated with ocular inflammation 10/28/2018   Uveitic glaucoma  of right eye, severe stage 10/28/2018   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 ([place]) 08/21/2018   Aortic atherosclerosis ([place]) 08/19/2018   Uveitis 05/05/2018   Goiter 05/05/2018   Mixed hyperlipidemia 05/05/2018   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   Genetic testing 02/07/2018   Family history of kidney cancer 01/15/2018   Family history of pancreatic cancer    Family history of breast cancer    Family history of uterine cancer    Family history of colon cancer    Family history of prostate cancer    Family history of lung cancer    Hypertensive retinopathy of both eyes 11/12/2017   Iritis of right eye 11/12/2017   Nuclear sclerotic cataract of left eye 11/12/2017   Ocular hypertension of right eye 11/12/2017   Inflammation of eye, right 08/07/2017   Memory loss 09/21/2016   Essential hypertension 09/21/2016   Low vitamin D level 09/21/2016   DOE (dyspnea on exertion) 11/08/2010    PCP: Wendie Agreste, MD  REFERRING PROVIDER: Gregor Hams, MD  REFERRING DIAG: [phone] (ICD-10-CM) - Chronic pain of right knee M17.11 (ICD-10-CM) - Primary osteoarthritis of right knee  THERAPY DIAG:  Chronic pain of right knee  Muscle weakness (generalized)  Difficulty in walking, not elsewhere classified  Rationale for Evaluation and Treatment Rehabilitation  ONSET DATE: August 2023  SUBJECTIVE:   SUBJECTIVE STATEMENT: Had some mild soreness after last session but she had just changed shoes. States today she is trying a new inserts and not currently wearing her brace. States overall she feels about 75% better but she hasn't been walking around the park because it has been so hot. States that she is not depending on her brace like she was and hasn't had it on for 2 full days.  Eval: Last august she tore the meniscus in her right knee. States that her MD recommend a knee replacement. States she already had a brain tumor that needed to  be removed and she removed in December. States that she tried PT but it didn't help. States that she then saw Dr. Noemi Chapel and he gave her a shot and she has been wearing a brace since. States that she now can walk with the injection and the brace. States that she was out walking and was rec to see Dr. Georgina Snell and they are starting the gel injections next Tuesday. States that she was able to walk all the way around [place] the other [place] which is the first time in 2 years. States she would like to get her knees stronger.   Patient reports she originally tore her meniscus when she rolled over in bed trying to do an exercise.  PERTINENT HISTORY: clotting issuses - on  blood thinners, cardiac disease, history of meningioma.   PAIN:  Are you having pain? Yes: NPRS scale: 0/10 Pain location: right knee medial Pain description: soreness Aggravating factors: walking,   Relieving factors: rest, brace, bands    WEIGHT BEARING RESTRICTIONS No  FALLS:  Has patient fallen in last 6 months? No   OCCUPATION: not currently working  PLOF: Independent  PATIENT GOALS to have less pain and to get more active.    OBJECTIVE:   DIAGNOSTIC FINDINGS: previous imaging of knee demonstrated torn meniscus per patient    COGNITION:  Overall cognitive status: Within functional limits for tasks assessed     SENSATION: WFL  EDEMA:  Circumferential: left 48.5.0 cm, Right 49.0cm    PALPATION: Swelling throughout right leg, increased resting muscle tension in right leg, patella hypermobile laterally/medially but positioned superiorly    LE Measurements Lower Extremity Right 10/24/21 Left 10/24/2021   A/PROM MMT A/PROM MMT  Hip Flexion  4+  4+  Hip Extension      Hip Abduction      Hip Adduction      Hip Internal rotation      Hip External rotation      Knee Flexion 134 4+ 135 4+  Knee Extension 2 (hyper) 4+ 4 (hyper) 4+  Ankle Dorsiflexion  5  5  Ankle Plantarflexion      Ankle Inversion       Ankle Eversion       (Blank rows = not tested)  * pain    GAIT Comments: with brace wears brace, limited knee extension, foot flat gait    TODAY'S TREATMENT:  10/24/2021 Therapeutic Exercise: Aerobic:  Supine:  bridge x12 5" holds, SLR 3 x 10 bil;  Prone:  Seated: LAQ 2.5  lb  2x 10 5" bil;    Standing: foot pedals with level hips (kickstand SLS) 3 minutes alternating,, mini lateral shift - too difficult, DF at wall x30 5" holds B alternating at wall,  Neuromuscular Re-education:   Manual Therapy:  Modalities:  Self Care:     Previous:  Supine:  bridge 3x10 5" holds, SLR 3 x 10 bil;  Prone: hamstring curl 3x10 B, hip exten 4x5 B  Seated: LAQ 2.5  lb  2x 10 5" bil;    Standing: , HR, hip abd 2x10 bil;    PATIENT EDUCATION:  Education details: Reviewed HEP, on current presentation  Person educated: Patient Education method: Explanation, Demonstration, and Handouts Education comprehension: verbalized understanding   HOME EXERCISE PROGRAM: [id number]  ASSESSMENT:  CLINICAL IMPRESSION: 10/24/2021 Session focused on reviewing goals and function. Patient has met all but one short and one long term goal at this time. Progressed exercises which were tolerated well. Difficult for patient to control hip motion with standing exercises but this improved with repetition. Will continue with current POC as tolerated.   Eval: Patient is a 71 y.o. female who was seen today for physical therapy evaluation and treatment for right knee pain that primarily hurts when the bone "shifts out of place." Patient presents with swelling, weakness and instability in right knee. Educated patient on signs and symptoms more consistent with patella instability vs. Meniscal tear but how both could be contributing to current presentation. Patient would greatly benefit from skilled PT to improve overall function and QOL.    OBJECTIVE IMPAIRMENTS Abnormal gait, decreased activity tolerance, decreased  balance, decreased knowledge of use of DME, difficulty walking, decreased ROM, decreased strength, increased edema, and pain.   ACTIVITY LIMITATIONS  lifting, standing, squatting, stairs, transfers, and locomotion level  PARTICIPATION LIMITATIONS: cleaning and community activity  PERSONAL FACTORS Age are also affecting patient's functional outcome.   REHAB POTENTIAL: Good  CLINICAL DECISION MAKING: Stable/uncomplicated  EVALUATION COMPLEXITY: Low  GOALS: Goals reviewed with patient?  yes  SHORT TERM GOALS:  Patient will be independent in self management strategies to improve quality of life and functional outcomes. Baseline: new program Target date: 10/19/2021 Goal status: Progressing   2.  Patient will report at least 50% improvement in overall symptoms and/or function to demonstrate improved functional mobility Baseline: 0% Target date: 10/19/2021 Goal status: MET  3.  Patient will demonstrate swelling in legs within .5cm of each other to demonstrate reduced swelling. Baseline: see above Target date: 10/19/2021 Goal status: MET     LONG TERM GOALS:  Patient will report at least 75% improvement in overall symptoms and/or function to demonstrate improved functional mobility Baseline: 0% Target date: 11/09/2021 Goal status: MET  2.  Patient will be able to walk 1-2 miles without pain during or after to improve ability to walk her dog Baseline: unable Target date: 11/09/2021 Goal status: PROGRESSING  3.  Patent will demonstrate 4+/5 MMT in LE to demonstrate improved strength. Baseline: see above Target date: 11/09/2021 Goal status: MET      PLAN: PT FREQUENCY: 2x/week  PT DURATION: 6 weeks  PLANNED INTERVENTIONS: Therapeutic exercises, Therapeutic activity, Neuromuscular re-education, Balance training, Gait training, Patient/Family education, Self Care, Joint mobilization, Joint manipulation, Stair training, Vestibular training, Orthotic/Fit training, DME  instructions, Aquatic Therapy, Dry Needling, Electrical stimulation, Cryotherapy, Moist heat, Traction, Ultrasound, Ionotophoresis 58m/ml Dexamethasone, and Manual therapy  PLAN FOR NEXT SESSION: assess hip strength- strengthen hip/LE, STM as indicated, add standing balance/tandem stance.   3:57 PM, 10/24/21 MJerene Pitch DPT [facility]

## 2021-10-25 ENCOUNTER — Ambulatory Visit: Payer: Medicare Other | Admitting: Neurology

## 2021-10-25 ENCOUNTER — Encounter (INDEPENDENT_AMBULATORY_CARE_PROVIDER_SITE_OTHER): Payer: Self-pay

## 2021-10-26 ENCOUNTER — Encounter: Payer: Medicare Other | Admitting: Physical Therapy

## 2021-10-27 ENCOUNTER — Ambulatory Visit (INDEPENDENT_AMBULATORY_CARE_PROVIDER_SITE_OTHER): Payer: Medicare Other | Admitting: Family Medicine

## 2021-10-27 ENCOUNTER — Encounter: Payer: Self-pay | Admitting: Family Medicine

## 2021-10-27 VITALS — BP 130/70 | HR 74 | Temp 98.0°F | Resp 19 | Ht 63.0 in | Wt 209.2 lb

## 2021-10-27 DIAGNOSIS — R7303 Prediabetes: Secondary | ICD-10-CM

## 2021-10-27 DIAGNOSIS — I739 Peripheral vascular disease, unspecified: Secondary | ICD-10-CM | POA: Diagnosis not present

## 2021-10-27 DIAGNOSIS — M057A Rheumatoid arthritis with rheumatoid factor of other specified site without organ or systems involvement: Secondary | ICD-10-CM

## 2021-10-27 DIAGNOSIS — E2740 Unspecified adrenocortical insufficiency: Secondary | ICD-10-CM | POA: Diagnosis not present

## 2021-10-27 DIAGNOSIS — E782 Mixed hyperlipidemia: Secondary | ICD-10-CM | POA: Diagnosis not present

## 2021-10-27 DIAGNOSIS — I2510 Atherosclerotic heart disease of native coronary artery without angina pectoris: Secondary | ICD-10-CM | POA: Diagnosis not present

## 2021-10-27 DIAGNOSIS — L409 Psoriasis, unspecified: Secondary | ICD-10-CM | POA: Diagnosis not present

## 2021-10-27 DIAGNOSIS — K219 Gastro-esophageal reflux disease without esophagitis: Secondary | ICD-10-CM

## 2021-10-27 DIAGNOSIS — I10 Essential (primary) hypertension: Secondary | ICD-10-CM

## 2021-10-27 NOTE — Progress Notes (Unsigned)
Subjective:  Patient ID: Pamela Lowe, female    DOB: 21-May-1950  Age: 71 y.o. MRN: [id number]  CC:  Chief Complaint  Patient presents with   Hyperlipidemia    Pt states evert thing is going well    HPI [place] presents for   Easy bruising Discussed in June.  Likely due to aspirin plus Plavix.  CBC, PT/INR ordered.  CBC reassuring, PT/INR was not run. Option of hematology eval if worsening. No recent bruising. No hematuria or blood in stool. No change in gum bleeding.   Intermittent abdominal pain, spasms, history of Lynch syndrome Followed by gastroenterology, Dr. Ardis Hughs, hyoscyamine if needed - rare use- once per year.  With history of Lynch syndrome plan for oophorectomy, followed by GYN. Protonix as needed for reflux  - not needed recently.   Prediabetes, history of meningioma, adrenal insufficiency. Discussed in May, had been taking steroids postsurgery and some elevated blood sugars.  Followed by rheumatologist for rheumatoid arthritis versus more likely psoriatic arthritis, Dr. Dossie Der. On methotrexate - lower dosing d/t increased LFT's. Simponi Aria every 8 weeks.  On hydrocortisone for adrenal insufficiency.  Status post meningioma removal.  She is followed by endocrinology Dr. Chalmers Cater with history of goiter.   Most recent A1c July 12 was improved. Neurologist Dr. Algie Coffer, saw neuro-ophthalmologist follow-up with bitemporal field cuts now followed by Dr. Roseanne Kaufman for uveitis, Dr. Ander Slade for glaucoma.  No longer on acetazoloamide. Using other eyedrops, and history of uveitis with use of methotrexate, Simponi Aria. Followed by weight management with recent labs.  Lab Results  Component Value Date   HGBA1C 5.6 09/27/2021   Wt Readings from Last 3 Encounters:  10/27/21 209 lb 3.2 oz (94.9 kg)  10/11/21 206 lb (93.4 kg)  09/27/21 213 lb (96.6 kg)   Cardiac Followed by allergy, Dr. Daneen Schick.  History of hypertension, CAD, PAD, status post coronary artery stent  placement in 2020 with residual CAD in mRCA, small PDA.  ABIs last September stable without significant change since prior study.  She is treated with ezetimibe, rosuvastatin for lipids, metoprolol 25 mg daily, benazepril 20 mg daily with HCTZ on Monday, Wednesday, Friday - working well. No new swelling.  Also on Plavix, aspirin as above. Home readings: 117-130/70's No new side effects with meds, refilled by cardiology.   BP Readings from Last 3 Encounters:  10/27/21 130/70  10/11/21 109/70  09/27/21 113/77   Lab Results  Component Value Date   CREATININE 1.03 (H) 09/27/2021   Hyperlipidemia: Crestor 20 mg daily, zetia daily.  No chest pain or new myalgias. Recent labs noted.  Lab Results  Component Value Date   CHOL 148 09/27/2021   HDL 83 09/27/2021   LDLCALC 49 09/27/2021   TRIG 85 09/27/2021   CHOLHDL 1.9 05/29/2021   Lab Results  Component Value Date   ALT 33 (H) 09/27/2021   AST 36 09/27/2021   ALKPHOS 65 09/27/2021   BILITOT 0.8 09/27/2021    History Patient Active Problem List   Diagnosis Date Noted   Osteoarthritis 09/28/2021   Degeneration of lumbosacral intervertebral disc 09/28/2021   Elevated ALT measurement 09/28/2021   Other fatigue 09/27/2021   SOB (shortness of breath) on exertion 09/27/2021   Elevated glucose 09/27/2021   Health care maintenance 09/27/2021   Vitamin D deficiency 09/27/2021   Depression screening 09/27/2021   Class 1 obesity with serious comorbidity and body mass index (BMI) of 34.0 to 34.9 in adult 09/27/2021   Spondylosis without  myelopathy 08/03/2021   Rheumatoid arthritis with rheumatoid factor ([place]) 08/03/2021   Psoriasis 08/03/2021   Hypopituitarism ([place]) 08/03/2021   Adrenal insufficiency ([place]) 08/03/2021   Gastroesophageal reflux disease 07/26/2021   Meningioma ([place]) 03/21/2021   Visual field defect 03/21/2021   Chronic left shoulder pain 03/21/2021   Cervical radiculopathy 03/21/2021   History of pituitary surgery  03/06/2021   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   Pseudophakia of both eyes 06/29/2019   Steroid responders to glaucoma of both eyes 06/29/2019   S/P coronary artery stent placement    PAD (peripheral artery disease) (HCC)    CAD (coronary artery disease) 12/26/2018   Angina pectoris ([place])    Primary open-angle glaucoma 10/28/2018   Glaucoma associated with ocular inflammation 10/28/2018   Uveitic glaucoma of right eye, severe stage 10/28/2018   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 ([place]) 08/21/2018   Aortic atherosclerosis ([place]) 08/19/2018   Uveitis 05/05/2018   Goiter 05/05/2018   Mixed hyperlipidemia 05/05/2018   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   Genetic testing 02/07/2018   Family history of kidney cancer 01/15/2018   Family history of pancreatic cancer    Family history of breast cancer    Family history of uterine cancer    Family history of colon cancer    Family history of prostate cancer    Family history of lung cancer    Hypertensive retinopathy of both eyes 11/12/2017   Iritis of right eye 11/12/2017   Nuclear sclerotic cataract of left eye 11/12/2017   Ocular hypertension of right eye 11/12/2017   Inflammation of eye, right 08/07/2017   Memory loss 09/21/2016   Essential hypertension 09/21/2016   Low vitamin D level 09/21/2016   DOE (dyspnea on exertion) 11/08/2010   Past Medical History:  Diagnosis Date   Allergic rhinitis    Allergy    Arthritis    Brain tumor ([place])    Breast cyst 2007   Right   Cataract    surgery 07/2017   Clotting disorder ([place])    On plavix since stents  for artery blockages   Depression 2006   w/ menopause   Family history of breast cancer    Family history of colon cancer    Family history of kidney cancer 01/15/2018   Family history of lung cancer    Family history of pancreatic cancer    Family history of prostate cancer    Family history of uterine cancer    GERD  (gastroesophageal reflux disease)    Glaucoma    Heart murmur    History of hysterectomy, supracervical    Hyperlipidemia    Hypertension    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   MLH1 [id number] (Splice site)   Multinodular goiter    PAD (peripheral artery disease) ([place])    LE Art [place] 1/18: Occluded prox-mid R SFA // ABIs 9/22: R 0.83; L 1.0   Pustular psoriasis    Tobacco user    Past Surgical History:  Procedure Laterality Date   ABDOMINAL HYSTERECTOMY  1995   BRAIN TUMOR EXCISION  02/24/2021   at [place]     CATARACT EXTRACTION Right 07/2017   COLONOSCOPY     CORONARY STENT INTERVENTION N/A 12/26/2018   Procedure: CORONARY STENT INTERVENTION;  Surgeon: Belva Crome, MD;  Location: [facility];  Service: Cardiovascular;  Laterality: N/A;   EYE SURGERY  2019, 2020   Cataracts, eye  shunt implants   LEFT HEART CATH AND CORONARY ANGIOGRAPHY N/A 12/17/2018   Procedure: LEFT HEART CATH AND CORONARY ANGIOGRAPHY;  Surgeon: Belva Crome, MD;  Location: [facility];  Service: Cardiovascular;  Laterality: N/A;   SUPRACERVICAL ABDOMINAL HYSTERECTOMY  1986   h/o supracervical hysterectomy   UPPER GASTROINTESTINAL ENDOSCOPY     Allergies  Allergen Reactions   Sulfamethoxazole-Trimethoprim Other (See Comments) and Rash    Chills/achy Flu like symptoms    Conjugated Estrogens     Other reaction(s): rash   Wellbutrin [Bupropion Hcl] Rash    rash   Prior to Admission medications   Medication Sig Start Date End Date Taking? Authorizing Provider  AMBULATORY NON FORMULARY MEDICATION Apply 1 drop topically at bedtime. Terbinafine DMSO 3.34% susp 06/15/21  Yes [provider]  Ascorbic Acid (VITAMIN C) 500 MG CAPS Take 1 tablet by mouth daily.   Yes [provider]  aspirin 81 MG EC tablet Take 81 mg by mouth every evening.    Yes [provider]  B Complex Vitamins (VITAMIN B COMPLEX) TABS  Take 1 tablet by mouth daily.   Yes [provider]  benazepril (LOTENSIN) 20 MG tablet TAKE 1 TABLET BY MOUTH DAILY 09/28/21  Yes Belva Crome, MD  Biotin 1000 MCG tablet Take 1 tablet by mouth daily.   Yes [provider]  brimonidine (ALPHAGAN) 0.2 % ophthalmic solution Place 1 drop into both eyes 3 (three) times daily.  11/20/18  Yes [provider]  Calcium Carb-Cholecalciferol (CALCIUM-VITAMIN D) 600-400 MG-UNIT TABS Take 1 tablet by mouth daily.   Yes [provider]  clopidogrel (PLAVIX) 75 MG tablet Take 1 tablet (75 mg total) by mouth daily. 03/14/21  Yes Belva Crome, MD  dorzolamide-timolol (COSOPT) 22.3-6.8 MG/ML ophthalmic solution Place 1 drop into the right eye 2 (two) times daily. 06/29/19  Yes [provider]  ezetimibe (ZETIA) 10 MG tablet TAKE 1 TABLET BY MOUTH DAILY 10/16/21  Yes Belva Crome, MD  fexofenadine (ALLEGRA) 60 MG tablet Take 60 mg by mouth as needed for allergies or rhinitis.   Yes [provider]  folic acid (FOLVITE) 1 MG tablet Take 1 mg by mouth daily.    Yes [provider]  hydrochlorothiazide (MICROZIDE) 12.5 MG capsule Take 1 capsule (12.5 mg total) by mouth every Monday, Wednesday, and Friday. 03/15/21  Yes Belva Crome, MD  hydrocortisone (CORTEF) 10 MG tablet Take 10 mg by mouth daily. 07/08/21  Yes [provider]  hyoscyamine (LEVSIN SL) 0.125 MG SL tablet DISSOLVE 1 TO 2 TABLETS IN MOUTH UNDER THE TONGUE TWICE DAILY AS NEEDED 04/19/21  Yes Milus Banister, MD  methotrexate (RHEUMATREX) 2.5 MG tablet Take by mouth once a week. Mondays Take 6 tablets   Yes [provider]  metoprolol succinate (TOPROL-XL) 25 MG 24 hr tablet Take 1 tablet by mouth once daily 03/21/21  Yes Belva Crome, MD  Multiple Vitamins-Minerals (MULTIVITAMIN WITH MINERALS) tablet Take 1 tablet by mouth daily.   Yes [provider]  Omega-3 Fatty Acids (FISH OIL) 1000 MG CAPS Take 1 capsule by  mouth daily.    Yes [provider]  pantoprazole (PROTONIX) 40 MG tablet Take 1 tablet (40 mg total) by mouth daily. 07/26/21  Yes Zehr, Janett Billow D, PA-C  rosuvastatin (CRESTOR) 20 MG tablet TAKE 1 TABLET BY MOUTH DAILY 10/16/21  Yes Belva Crome, MD  [facility] ARIA 50 MG/4ML SOLN injection Inject 50 mg into  the vein. Every 60 Days 07/16/19  Yes [provider]  Specialty Vitamins Products (COLLAGEN ULTRA) CAPS Take 1 capsule by mouth daily.   Yes [provider]  ipratropium (ATROVENT) 0.06 % nasal spray Place 2 sprays into both nostrils 3 (three) times daily. Patient not taking: Reported on 10/27/2021 12/03/19   [provider]  modafinil (PROVIGIL) 200 MG tablet Take 1 tablet (200 mg total) by mouth daily. Patient not taking: Reported on 10/27/2021 09/04/21   Sater, Nanine Means, MD   Social History   Socioeconomic History   Marital status: Married    Spouse name: [name]   Number of children: 0   Years of education: Not on file   Highest education level: Master's degree (e.g., MA, MS, MEng, MEd, MSW, MBA)  Occupational History   Occupation: Special Ed Teacher    Comment: Engineer, manufacturing  Tobacco Use   Smoking status: Former    Packs/day: 0.25    Years: 36.00    Total pack years: 9.00    Types: Cigarettes    Quit date: 08/01/2017    Years since quitting: 4.2   Smokeless tobacco: Never   Tobacco comments:    Started smoking at about 70 yrs old  Vaping Use   Vaping Use: Never used  Substance and Sexual Activity   Alcohol use: Not Currently   Drug use: No   Sexual activity: Not Currently    Partners: Male    Birth control/protection: Post-menopausal, Surgical    Comment: Hysterectomy  Other Topics Concern   Not on file  Social History Narrative   Not on file   Social Determinants of Health   Financial Resource Strain: Low Risk  (08/03/2021)   Overall Financial Resource Strain (CARDIA)    Difficulty of Paying Living Expenses: Not hard at all   Food Insecurity: No Food Insecurity (08/03/2021)   Hunger Vital Sign    Worried About Running Out of Food in the Last Year: Never true    [place] in the Last Year: Never true  Transportation Needs: No Transportation Needs (08/03/2021)   PRAPARE - Hydrologist (Medical): No    Lack of Transportation (Non-Medical): No  Physical Activity: Sufficiently Active (08/03/2021)   Exercise Vital Sign    Days of Exercise per Week: 6 days    Minutes of Exercise per Session: 30 min  Stress: No Stress Concern Present (08/03/2021)   [place]    Feeling of Stress : Not at all  Social Connections: Moderately Integrated (08/03/2021)   Social Connection and Isolation Panel [NHANES]    Frequency of Communication with Friends and Family: More than three times a week    Frequency of Social Gatherings with Friends and Family: More than three times a week    Attends Religious Services: More than 4 times per year    Active Member of [employer] or Organizations: No    Attends Archivist Meetings: Never    Marital Status: Married  Human resources officer Violence: Not At Risk (08/03/2021)   Humiliation, Afraid, Rape, and Kick questionnaire    Fear of Current or Ex-Partner: No    Emotionally Abused: No    Physically Abused: No    Sexually Abused: No    Review of Systems  Constitutional:  Negative for fatigue and unexpected weight change.  Respiratory:  Negative for chest tightness and shortness of breath.   Cardiovascular:  Negative for chest  pain, palpitations and leg swelling.  Gastrointestinal:  Negative for abdominal pain and blood in stool.  Neurological:  Negative for dizziness, syncope, light-headedness and headaches.     Objective:   Vitals:   10/27/21 0946  BP: 130/70  Pulse: 74  Resp: 19  Temp: 98 F (36.7 C)  SpO2: 94%  Weight: 209 lb 3.2 oz (94.9 kg)  Height: 5' 3" (1.6 m)      Physical Exam Vitals reviewed.  Constitutional:      Appearance: Normal appearance. She is well-developed.  HENT:     Head: Normocephalic and atraumatic.  Eyes:     Conjunctiva/sclera: Conjunctivae normal.     Pupils: Pupils are equal, round, and reactive to light.  Neck:     Vascular: No carotid bruit.  Cardiovascular:     Rate and Rhythm: Normal rate and regular rhythm.     Heart sounds: Normal heart sounds.  Pulmonary:     Effort: Pulmonary effort is normal.     Breath sounds: Normal breath sounds.  Abdominal:     Palpations: Abdomen is soft. There is no pulsatile mass.     Tenderness: There is no abdominal tenderness.  Musculoskeletal:     Right lower leg: No edema.     Left lower leg: No edema.  Skin:    General: Skin is warm and dry.  Neurological:     Mental Status: She is alert and oriented to person, place, and time.  Psychiatric:        Mood and Affect: Mood normal.        Behavior: Behavior normal.        Assessment & Plan:  TERRELL SHIMKO is a 71 y.o. female . Essential hypertension Coronary artery disease involving native coronary artery of native heart without angina pectoris PAD (peripheral artery disease) (HCC)  -Asymptomatic, tolerating current regimen, has follow-up with cardiology.  No med changes, recent labs noted.  Mixed hyperlipidemia  -Tolerating current statin regimen, recent labs noted, no changes.  Prediabetes  -Recent labs noted, improved A1c.  Continue follow-up with weight management specialist.  Gastroesophageal reflux disease, unspecified whether esophagitis present  -Rare need for PPI, stable, continue same regimen.  Rheumatoid arthritis of other site with positive rheumatoid factor (HCC) Psoriasis  -Probable psoriatic arthritis versus RA, followed by rheumatology.  Continue follow-up and med regimen, no changes.  Stable.  Adrenal insufficiency (HCC)  -Treated by endocrinology, stable, continue routine  follow-up.  Based on labs from other offices, deferred lab work today.  3-monthfollow-up  No orders of the defined types were placed in this encounter.  Patient Instructions  No change in medications for now, glad you are doing well.  Keep follow-up with specialists.   If you are running out of medications let me know and we can certainly send in a refill temporarily.     Signed,   JMerri Ray MD [facility] 10/28/21 10:59 AM

## 2021-10-27 NOTE — Patient Instructions (Signed)
No change in medications for now, glad you are doing well.  Keep follow-up with specialists.   If you are running out of medications let me know and we can certainly send in a refill temporarily.

## 2021-10-28 ENCOUNTER — Encounter: Payer: Self-pay | Admitting: Family Medicine

## 2021-10-30 ENCOUNTER — Encounter: Payer: Medicare Other | Admitting: Physical Therapy

## 2021-10-30 DIAGNOSIS — H401133 Primary open-angle glaucoma, bilateral, severe stage: Secondary | ICD-10-CM | POA: Diagnosis not present

## 2021-10-30 DIAGNOSIS — D443 Neoplasm of uncertain behavior of pituitary gland: Secondary | ICD-10-CM | POA: Diagnosis not present

## 2021-10-30 DIAGNOSIS — H40043 Steroid responder, bilateral: Secondary | ICD-10-CM | POA: Diagnosis not present

## 2021-11-02 ENCOUNTER — Encounter: Payer: Medicare Other | Admitting: Physical Therapy

## 2021-11-06 ENCOUNTER — Ambulatory Visit (INDEPENDENT_AMBULATORY_CARE_PROVIDER_SITE_OTHER): Payer: Medicare Other | Admitting: Physical Therapy

## 2021-11-06 ENCOUNTER — Encounter: Payer: Self-pay | Admitting: Physical Therapy

## 2021-11-06 DIAGNOSIS — M25561 Pain in right knee: Secondary | ICD-10-CM | POA: Diagnosis not present

## 2021-11-06 DIAGNOSIS — M6281 Muscle weakness (generalized): Secondary | ICD-10-CM

## 2021-11-06 DIAGNOSIS — G8929 Other chronic pain: Secondary | ICD-10-CM | POA: Diagnosis not present

## 2021-11-06 DIAGNOSIS — R262 Difficulty in walking, not elsewhere classified: Secondary | ICD-10-CM | POA: Diagnosis not present

## 2021-11-06 NOTE — Therapy (Addendum)
OUTPATIENT PHYSICAL THERAPY LOWER EXTREMITY TREATMENT   PHYSICAL THERAPY DISCHARGE SUMMARY  Visits from Start of Care: 9  Current functional level related to goals / functional outcomes: Unable to assess due to unplanned discharge    Remaining deficits: Unable to assess due to unplanned discharge    Education / Equipment: Unable to assess due to unplanned discharge    Patient agrees to discharge. Patient goals were partially met. Patient is being discharged due to not returning since the last visit.  2:37 PM, 05/17/22 Jerene Pitch, DPT Physical Therapy with [facility]   Patient Name: Pamela Lowe MRN: [id number] DOB:[birth year], 71 y.o., female Today's Date: 10/19/2021   PT End of Session - 11/06/21 1515     Visit Number 9    Number of Visits 12    Date for PT Re-Evaluation 11/09/21    Authorization Type medicare, BCBS supplement    Progress Note Due on Visit 18    PT Start Time 1518    PT Stop Time 1556    PT Time Calculation (min) 38 min    Activity Tolerance Patient tolerated treatment well    Behavior During Therapy [facility] for tasks assessed/performed                 Past Medical History:  Diagnosis Date   Allergic rhinitis    Allergy    Arthritis    Brain tumor ([place])    Breast cyst 2007   Right   Cataract    surgery 07/2017   Clotting disorder ([place])    On plavix since stents  for artery blockages   Depression 2006   w/ menopause   Family history of breast cancer    Family history of colon cancer    Family history of kidney cancer 01/15/2018   Family history of lung cancer    Family history of pancreatic cancer    Family history of prostate cancer    Family history of uterine cancer    GERD (gastroesophageal reflux disease)    Glaucoma    Heart murmur    History of hysterectomy, supracervical    Hyperlipidemia    Hypertension    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   MLH1 [id number] (Splice site)   Multinodular goiter    PAD  (peripheral artery disease) ([place])    LE Art [place] 1/18: Occluded prox-mid R SFA // ABIs 9/22: R 0.83; L 1.0   Pustular psoriasis    Tobacco user    Past Surgical History:  Procedure Laterality Date   ABDOMINAL HYSTERECTOMY  1995   BRAIN TUMOR EXCISION  02/24/2021   at [place]     CATARACT EXTRACTION Right 07/2017   COLONOSCOPY     CORONARY STENT INTERVENTION N/A 12/26/2018   Procedure: CORONARY STENT INTERVENTION;  Surgeon: Belva Crome, MD;  Location: [facility];  Service: Cardiovascular;  Laterality: N/A;   EYE SURGERY  2019, 2020   Cataracts, eye shunt implants   LEFT HEART CATH AND CORONARY ANGIOGRAPHY N/A 12/17/2018   Procedure: LEFT HEART CATH AND CORONARY ANGIOGRAPHY;  Surgeon: Belva Crome, MD;  Location: [facility];  Service: Cardiovascular;  Laterality: N/A;   SUPRACERVICAL ABDOMINAL HYSTERECTOMY  1986   h/o supracervical hysterectomy   UPPER GASTROINTESTINAL ENDOSCOPY     Patient Active Problem List   Diagnosis Date Noted   Osteoarthritis 09/28/2021   Degeneration of lumbosacral intervertebral disc 09/28/2021   Elevated ALT measurement 09/28/2021  Other fatigue 09/27/2021   SOB (shortness of breath) on exertion 09/27/2021   Elevated glucose 09/27/2021   Health care maintenance 09/27/2021   Vitamin D deficiency 09/27/2021   Depression screening 09/27/2021   Class 1 obesity with serious comorbidity and body mass index (BMI) of 34.0 to 34.9 in adult 09/27/2021   Spondylosis without myelopathy 08/03/2021   Rheumatoid arthritis with rheumatoid factor ([place]) 08/03/2021   Psoriasis 08/03/2021   Hypopituitarism ([place]) 08/03/2021   Adrenal insufficiency ([place]) 08/03/2021   Gastroesophageal reflux disease 07/26/2021   Meningioma ([place]) 03/21/2021   Visual field defect 03/21/2021   Chronic left shoulder pain 03/21/2021   Cervical radiculopathy 03/21/2021   History of pituitary surgery 03/06/2021    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   Pseudophakia of both eyes 06/29/2019   Steroid responders to glaucoma of both eyes 06/29/2019   S/P coronary artery stent placement    PAD (peripheral artery disease) ([facility])    CAD (coronary artery disease) 12/26/2018   Angina pectoris ([place])    Primary open-angle glaucoma 10/28/2018   Glaucoma associated with ocular inflammation 10/28/2018   Uveitic glaucoma of right eye, severe stage 10/28/2018   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 ([employer]) 08/21/2018   Aortic atherosclerosis ([place]) 08/19/2018   Uveitis 05/05/2018   Goiter 05/05/2018   Mixed hyperlipidemia 05/05/2018   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   Genetic testing 02/07/2018   Family history of kidney cancer 01/15/2018   Family history of pancreatic cancer    Family history of breast cancer    Family history of uterine cancer    Family history of colon cancer    Family history of prostate cancer    Family history of lung cancer    Hypertensive retinopathy of both eyes 11/12/2017   Iritis of right eye 11/12/2017   Nuclear sclerotic cataract of left eye 11/12/2017   Ocular hypertension of right eye 11/12/2017   Inflammation of eye, right 08/07/2017   Memory loss 09/21/2016   Essential hypertension 09/21/2016   Low vitamin D level 09/21/2016   DOE (dyspnea on exertion) 11/08/2010    PCP: Wendie Agreste, MD  REFERRING PROVIDER: Gregor Hams, MD  REFERRING DIAG: [phone] (ICD-10-CM) - Chronic pain of right knee M17.11 (ICD-10-CM) - Primary osteoarthritis of right knee  THERAPY DIAG:  Chronic pain of right knee  Muscle weakness (generalized)  Difficulty in walking, not elsewhere classified  Rationale for Evaluation and Treatment Rehabilitation  ONSET DATE: August 2023  SUBJECTIVE:   SUBJECTIVE STATEMENT: States she is feeling good with no pain and has not had any pain and has not had to wear her braces for support. States  she still has difficulties with coming down hill.  Eval: Last august she tore the meniscus in her right knee. States that her MD recommend a knee replacement. States she already had a brain tumor that needed to be removed and she removed in December. States that she tried PT but it didn't help. States that she then saw Dr. Noemi Chapel and he gave her a shot and she has been wearing a brace since. States that she now can walk with the injection and the brace. States that she was out walking and was rec to see Dr. Georgina Snell and they are starting the gel injections next Tuesday. States that she was able to walk all the way [place] which is the first time in 2 years. States she would like to get her knees  stronger.   Patient reports she originally tore her meniscus when she rolled over in bed trying to do an exercise.  PERTINENT HISTORY: clotting issuses - on blood thinners, cardiac disease, history of meningioma.   PAIN:  Are you having pain? Yes: NPRS scale: 0/10 Pain location: right knee medial Pain description: soreness Aggravating factors: walking,   Relieving factors: rest, brace, bands    WEIGHT BEARING RESTRICTIONS No  FALLS:  Has patient fallen in last 6 months? No   OCCUPATION: not currently working  PLOF: Independent  PATIENT GOALS to have less pain and to get more active.    OBJECTIVE:   DIAGNOSTIC FINDINGS: previous imaging of knee demonstrated torn meniscus per patient    COGNITION:  Overall cognitive status: Within functional limits for tasks assessed     SENSATION: WFL  EDEMA:  Circumferential: left 48.5.0 cm, Right 49.0cm    PALPATION: Swelling throughout right leg, increased resting muscle tension in right leg, patella hypermobile laterally/medially but positioned superiorly    LE Measurements Lower Extremity Right 10/24/21 Left 10/24/2021   A/PROM MMT A/PROM MMT  Hip Flexion  4+  4+  Hip Extension      Hip Abduction      Hip  Adduction      Hip Internal rotation      Hip External rotation      Knee Flexion 134 4+ 135 4+  Knee Extension 2 (hyper) 4+ 4 (hyper) 4+  Ankle Dorsiflexion  5  5  Ankle Plantarflexion      Ankle Inversion      Ankle Eversion       (Blank rows = not tested)  * pain    GAIT Comments: with brace wears brace, limited knee extension, foot flat gait    TODAY'S TREATMENT:  11/06/2021 Therapeutic Exercise: Aerobic: bike L2 4 minutes Supine: Seated:  Standing: DF at wall 2 minutes B, steps slow and controlled steps 4" 3 minutes, 6" step 2x12 slow and controlled, side step ups 3x10 B Neuromuscular Re-education:  Narrow BOS on blue foam eyes closed x5 max attempts, tandem on blue foam x4 30" holds, narrow BOS on foam with head turns 2 minutes Manual Therapy:  Modalities:  Self Care:     Previous:  Supine:  bridge 3x10 5" holds, SLR 3 x 10 bil;  Prone: hamstring curl 3x10 B, hip exten 4x5 B  Seated: LAQ 2.5  lb  2x 10 5" bil;    Standing: , HR, hip abd 2x10 bil;    PATIENT EDUCATION:  Education details: Reviewed HEP, on use of trekking poles with ascending and descending hills Person educated: Patient Education method: Explanation, Demonstration, and Handouts Education comprehension: verbalized understanding   HOME EXERCISE PROGRAM: [id number]  ASSESSMENT:  CLINICAL IMPRESSION: 11/06/2021 Overall patient is doing well. Able to progress balance and strengthening exercises. Discussed progress and plan moving forward. Anticipate DC next session pending patent presentation. Will continue to work on hills/stairs as tolerated. Reviewed all exercises on this date.   Eval: Patient is a 71 y.o. female who was seen today for physical therapy evaluation and treatment for right knee pain that primarily hurts when the bone "shifts out of place." Patient presents with swelling, weakness and instability in right knee. Educated patient on signs and symptoms more consistent with patella  instability vs. Meniscal tear but how both could be contributing to current presentation. Patient would greatly benefit from skilled PT to improve overall function and QOL.    OBJECTIVE IMPAIRMENTS Abnormal gait,  decreased activity tolerance, decreased balance, decreased knowledge of use of DME, difficulty walking, decreased ROM, decreased strength, increased edema, and pain.   ACTIVITY LIMITATIONS lifting, standing, squatting, stairs, transfers, and locomotion level  PARTICIPATION LIMITATIONS: cleaning and community activity  PERSONAL FACTORS Age are also affecting patient's functional outcome.   REHAB POTENTIAL: Good  CLINICAL DECISION MAKING: Stable/uncomplicated  EVALUATION COMPLEXITY: Low  GOALS: Goals reviewed with patient?  yes  SHORT TERM GOALS:  Patient will be independent in self management strategies to improve quality of life and functional outcomes. Baseline: new program Target date: 10/19/2021 Goal status: Progressing   2.  Patient will report at least 50% improvement in overall symptoms and/or function to demonstrate improved functional mobility Baseline: 0% Target date: 10/19/2021 Goal status: MET  3.  Patient will demonstrate swelling in legs within .5cm of each other to demonstrate reduced swelling. Baseline: see above Target date: 10/19/2021 Goal status: MET     LONG TERM GOALS:  Patient will report at least 75% improvement in overall symptoms and/or function to demonstrate improved functional mobility Baseline: 0% Target date: 11/09/2021 Goal status: MET  2.  Patient will be able to walk 1-2 miles without pain during or after to improve ability to walk her dog Baseline: unable Target date: 11/09/2021 Goal status: PROGRESSING  3.  Patent will demonstrate 4+/5 MMT in LE to demonstrate improved strength. Baseline: see above Target date: 11/09/2021 Goal status: MET      PLAN: PT FREQUENCY: 2x/week  PT DURATION: 6 weeks  PLANNED INTERVENTIONS:  Therapeutic exercises, Therapeutic activity, Neuromuscular re-education, Balance training, Gait training, Patient/Family education, Self Care, Joint mobilization, Joint manipulation, Stair training, Vestibular training, Orthotic/Fit training, DME instructions, Aquatic Therapy, Dry Needling, Electrical stimulation, Cryotherapy, Moist heat, Traction, Ultrasound, Ionotophoresis '4mg'$ /ml Dexamethasone, and Manual therapy  PLAN FOR NEXT SESSION: last visit, review HEP, progress balance and ascending/descending steps   4:15 PM, 11/06/21 Jerene Pitch, DPT Physical Therapy with [facility]

## 2021-11-08 ENCOUNTER — Encounter: Payer: Medicare Other | Admitting: Physical Therapy

## 2021-11-09 ENCOUNTER — Ambulatory Visit (INDEPENDENT_AMBULATORY_CARE_PROVIDER_SITE_OTHER): Payer: Medicare Other | Admitting: Bariatrics

## 2021-11-10 ENCOUNTER — Telehealth: Payer: Self-pay

## 2021-11-13 ENCOUNTER — Encounter: Payer: Medicare Other | Admitting: Physical Therapy

## 2021-11-14 DIAGNOSIS — J3489 Other specified disorders of nose and nasal sinuses: Secondary | ICD-10-CM | POA: Diagnosis not present

## 2021-11-14 DIAGNOSIS — D329 Benign neoplasm of meninges, unspecified: Secondary | ICD-10-CM | POA: Diagnosis not present

## 2021-11-14 DIAGNOSIS — J310 Chronic rhinitis: Secondary | ICD-10-CM | POA: Diagnosis not present

## 2021-11-16 ENCOUNTER — Encounter: Payer: Medicare Other | Admitting: Physical Therapy

## 2021-11-16 ENCOUNTER — Encounter (INDEPENDENT_AMBULATORY_CARE_PROVIDER_SITE_OTHER): Payer: Self-pay | Admitting: Bariatrics

## 2021-11-16 ENCOUNTER — Ambulatory Visit (INDEPENDENT_AMBULATORY_CARE_PROVIDER_SITE_OTHER): Payer: Medicare Other | Admitting: Bariatrics

## 2021-11-16 ENCOUNTER — Encounter (INDEPENDENT_AMBULATORY_CARE_PROVIDER_SITE_OTHER): Payer: Self-pay

## 2021-11-16 VITALS — BP 99/66 | HR 56 | Temp 98.5°F | Ht 63.0 in | Wt 202.0 lb

## 2021-11-16 DIAGNOSIS — E669 Obesity, unspecified: Secondary | ICD-10-CM

## 2021-11-16 DIAGNOSIS — E559 Vitamin D deficiency, unspecified: Secondary | ICD-10-CM

## 2021-11-16 DIAGNOSIS — R7303 Prediabetes: Secondary | ICD-10-CM | POA: Diagnosis not present

## 2021-11-16 DIAGNOSIS — Z6832 Body mass index (BMI) 32.0-32.9, adult: Secondary | ICD-10-CM

## 2021-11-22 ENCOUNTER — Ambulatory Visit
Admission: RE | Admit: 2021-11-22 | Discharge: 2021-11-22 | Disposition: A | Discharge status: Home / Self Care | Payer: Medicare Other | Source: Ambulatory Visit | Attending: Family Medicine | Admitting: Family Medicine

## 2021-11-22 ENCOUNTER — Encounter: Payer: Self-pay | Admitting: Family Medicine

## 2021-11-22 ENCOUNTER — Inpatient Hospital Stay: Admission: RE | Admit: 2021-11-22 | Payer: Medicare Other | Source: Ambulatory Visit

## 2021-11-22 ENCOUNTER — Ambulatory Visit (INDEPENDENT_AMBULATORY_CARE_PROVIDER_SITE_OTHER): Payer: Medicare Other | Admitting: Family Medicine

## 2021-11-22 VITALS — BP 116/80 | HR 88 | Temp 97.4°F | Resp 17 | Ht 63.0 in | Wt 209.5 lb

## 2021-11-22 DIAGNOSIS — S40022A Contusion of left upper arm, initial encounter: Secondary | ICD-10-CM

## 2021-11-22 DIAGNOSIS — I2510 Atherosclerotic heart disease of native coronary artery without angina pectoris: Secondary | ICD-10-CM

## 2021-11-22 DIAGNOSIS — R58 Hemorrhage, not elsewhere classified: Secondary | ICD-10-CM

## 2021-11-22 DIAGNOSIS — R233 Spontaneous ecchymoses: Secondary | ICD-10-CM

## 2021-11-22 NOTE — Progress Notes (Signed)
Spoke w/ pt and advised of lab results  

## 2021-11-22 NOTE — Telephone Encounter (Signed)
Error

## 2021-11-22 NOTE — Patient Instructions (Signed)
Follow up as needed or as scheduled We'll notify you of your ultrasound results and determine the next steps Warm compresses to the bruise will help it reabsorb I suspect the easy bruising is due to your medications- aspirin, Plavix, fish oil, hydrocortisone We will call you with your hematology referral Call with any questions or concerns Safe travels!

## 2021-11-22 NOTE — Progress Notes (Signed)
   Subjective:    Patient ID: Pamela Lowe, female    DOB: 08/11/1950, 71 y.o.   MRN: [id number]  HPI Bruising- pt reports she has had easy bruising recently.  Had labs done w/ PCP that were reassuring earlier this summer.  Has large bruise on L upper arm that she does not recall mechanism of injury.  Pt has firm, marble sized area in upper aspect of bruise.  No TTP.  + family hx of clots.  She is most concerned about the potential for clotting.  Is currently on ASA, Plavix, fish oil, hydrocortisone '10mg'$  daily.   Review of Systems For ROS see HPI     Objective:   Physical Exam Vitals reviewed.  Constitutional:      General: She is not in acute distress.    Appearance: Normal appearance. She is not ill-appearing.  HENT:     Head: Normocephalic and atraumatic.  Cardiovascular:     Pulses: Normal pulses.  Skin:    General: Skin is warm and dry.     Findings: Bruising (ecchymosis of L upper arm w/ 2cm firm but mobile area in superior aspect of bruise) present.  Neurological:     General: No focal deficit present.     Mental Status: She is alert and oriented to person, place, and time.  Psychiatric:        Mood and Affect: Mood normal.        Behavior: Behavior normal.        Thought Content: Thought content normal.           Assessment & Plan:  Easy bruising/Ecchymosis/Traumatic hematoma- new.  L upper arm.  Discussed w/ pt that her bruising is likely due to her ASA, Plavix, Fish Oil, and Hydrocortisone.  She was not aware that all of them could contribute.  Given her concern for bruising and her extensive family hx of clotting, will get [place] to assess the firm area in L upper arm.  Reviewed supportive care and red flags that should prompt return.  Pt expressed understanding and is in agreement w/ plan.

## 2021-11-22 NOTE — Addendum Note (Signed)
Addended by: Midge Minium on: 11/22/2021 10:32 AM   Modules accepted: Orders

## 2021-11-25 NOTE — Progress Notes (Unsigned)
Chief Complaint:   OBESITY Pamela Lowe is here to discuss her progress with her obesity treatment plan along with follow-up of her obesity related diagnoses. Pamela Lowe is on the Category 1 Plan and states she is following her eating plan approximately 95% of the time. Pamela Lowe states she is walking or riding her bike for 45 minutes 5 times per week.  Today's visit was #: 3 Starting weight: 213 lbs Starting date: 09/27/2021 Today's weight: 202 lbs Today's date: 11/16/2021 Total lbs lost to date: 11 Total lbs lost since last in-office visit: 4  Interim History: Pamela Lowe is down an additional 4 pounds and is doing well overall. She is getting in her water.  Subjective:   1. Prediabetes Pamela Lowe is not on medication.  2. Vitamin D deficiency Pamela Lowe is taking a OTC calcium/vitamin D supplement.  Assessment/Plan:   1. Prediabetes Pamela Lowe agrees to decrease carbohydrates, including sugar and starches.  2. Vitamin D deficiency Pamela Lowe agrees to continue taking her vitamin D supplement and to increase foods with vitamin D.  3. Obesity, current BMI 32.7 Pamela Lowe agrees to meal planning and will adhere closely to the plan. She will also wear her Fitbit.  Pamela Lowe is currently in the action stage of change. As such, her goal is to continue with weight loss efforts. She has agreed to the Category 1 Plan.   Exercise goals:  Pamela Lowe agrees to continue her activities and diet. She has been having Right knee pain and will ride her stationary bike.  Behavioral modification strategies: increasing lean protein intake, decreasing simple carbohydrates, increasing vegetables, increasing water intake, decreasing eating out, no skipping meals, meal planning and cooking strategies, keeping healthy foods in the home, and planning for success.  Pamela Lowe has agreed to follow-up with our clinic in 4 weeks at the [facility] office. She was informed of the importance of frequent follow-up visits to maximize her success  with intensive lifestyle modifications for her multiple health conditions.   Objective:   Blood pressure 99/66, pulse (!) 56, temperature 98.5 F (36.9 C), height '5\' 3"'$  (1.6 m), weight 202 lb (91.6 kg), last menstrual period 03/19/1998, SpO2 99 %. Body mass index is 35.78 kg/m.  General: Cooperative, alert, well developed, in no acute distress. HEENT: Conjunctivae and lids unremarkable. Cardiovascular: Regular rhythm.  Lungs: Normal work of breathing. Neurologic: No focal deficits.   Lab Results  Component Value Date   CREATININE 1.03 (H) 09/27/2021   BUN 13 09/27/2021   NA 140 09/27/2021   K 4.2 09/27/2021   CL 103 09/27/2021   CO2 24 09/27/2021   Lab Results  Component Value Date   ALT 33 (H) 09/27/2021   AST 36 09/27/2021   ALKPHOS 65 09/27/2021   BILITOT 0.8 09/27/2021   Lab Results  Component Value Date   HGBA1C 5.6 09/27/2021   HGBA1C 5.7 07/26/2021   HGBA1C 5.8 (A) 04/05/2021   Lab Results  Component Value Date   INSULIN 12.2 09/27/2021   Lab Results  Component Value Date   TSH 0.823 09/27/2021   Lab Results  Component Value Date   CHOL 148 09/27/2021   HDL 83 09/27/2021   LDLCALC 49 09/27/2021   TRIG 85 09/27/2021   CHOLHDL 1.9 05/29/2021   Lab Results  Component Value Date   VD25OH 30.3 09/27/2021   VD25OH 40.6 08/10/2021   VD25OH 41.0 09/21/2016   Lab Results  Component Value Date   WBC 5.7 09/14/2021   HGB 13.0 09/14/2021   HCT 40.4 09/14/2021  MCV 93.7 09/14/2021   PLT 297.0 09/14/2021   No results found for: "IRON", "TIBC", "FERRITIN"  Obesity Behavioral Intervention:   Approximately 15 minutes were spent on the discussion below.  ASK: We discussed the diagnosis of obesity with Pamela Lowe today and Pamela Lowe agreed to give [place] permission to discuss obesity behavioral modification therapy today.  ASSESS: Pamela Lowe has the diagnosis of obesity and her BMI today is 32.7. Pamela Lowe is in the action stage of change.   ADVISE: Pamela Lowe was  educated on the multiple health risks of obesity as well as the benefit of weight loss to improve her health. She was advised of the need for long term treatment and the importance of lifestyle modifications to improve her current health and to decrease her risk of future health problems.  AGREE: Multiple dietary modification options and treatment options were discussed and Pamela Lowe agreed to follow the recommendations documented in the above note.  ARRANGE: Pamela Lowe was educated on the importance of frequent visits to treat obesity as outlined per CMS and USPSTF guidelines and agreed to schedule her next follow up appointment today.  Attestation Statements:   Reviewed by clinician on day of visit: allergies, medications, problem list, medical history, surgical history, family history, social history, and previous encounter notes.  I, Marcille Blanco, CMA, am acting as Location manager for [employer]. Owens Shark, DO  I have reviewed the above documentation for accuracy and completeness, and I agree with the above. Jearld Lesch, DO

## 2021-11-29 ENCOUNTER — Telehealth: Payer: Self-pay | Admitting: Gastroenterology

## 2021-11-29 ENCOUNTER — Telehealth: Payer: Self-pay | Admitting: Hematology

## 2021-11-29 ENCOUNTER — Telehealth: Payer: Self-pay

## 2021-11-29 ENCOUNTER — Encounter (INDEPENDENT_AMBULATORY_CARE_PROVIDER_SITE_OTHER): Payer: Self-pay | Admitting: Bariatrics

## 2021-11-29 DIAGNOSIS — Z1509 Genetic susceptibility to other malignant neoplasm: Secondary | ICD-10-CM

## 2021-11-29 NOTE — Telephone Encounter (Signed)
This nurse received a message from this patient stating that her primary care physician advised her to reach out to this providers office due to her having bruising on multiple areas of her body for the past few weeks.  Patient states that for the last 2 nights she has noticed some bleeding in her stools.  Patient states that she has also reached out to Dr. Ardis Hughs office as well and has not received a return call.  This nurse will make MD aware.

## 2021-11-29 NOTE — Telephone Encounter (Signed)
Inbound call from patient stating that she has been having rectal bleeding after her bowel movements for the last two days. Patient was wanting to schedule her recall colonoscopy and endoscopy for October. I advised her that she may need to come in for an OV first and she sated she would like to speak with the nurse to see what her options are. Please advise.

## 2021-11-29 NOTE — Telephone Encounter (Signed)
Left message on machine to call back  

## 2021-11-29 NOTE — Telephone Encounter (Signed)
Scheduled appt per 9/6 referral. Pt already established with Dr. Burr Medico. Called pt, no answer. Left msg with appt date/time. Requested for pt to call back to confirm appt.

## 2021-11-30 ENCOUNTER — Ambulatory Visit
Admission: RE | Admit: 2021-11-30 | Discharge: 2021-11-30 | Disposition: A | Discharge status: Home / Self Care | Payer: Medicare Other | Source: Ambulatory Visit | Attending: Physician Assistant | Admitting: Physician Assistant

## 2021-11-30 DIAGNOSIS — Z1239 Encounter for other screening for malignant neoplasm of breast: Secondary | ICD-10-CM | POA: Diagnosis not present

## 2021-11-30 DIAGNOSIS — Z1509 Genetic susceptibility to other malignant neoplasm: Secondary | ICD-10-CM

## 2021-11-30 MED ORDER — GADOBUTROL 1 MMOL/ML IV SOLN
10.0000 mL | Freq: Once | INTRAVENOUS | Status: AC | PRN
  Administered 2021-11-30: 10 mL via INTRAVENOUS

## 2021-11-30 NOTE — Telephone Encounter (Signed)
Mansouraty, Telford Nab., MD  Puryear, Marval Regal, LPN; Timothy Lasso, RN; Wendie Agreste, MD Cc: [name], MD We will be happy to see the patient and get her scheduled for her lower and upper endoscopy.  We would need to have some semblance about getting her off of her Plavix and can work on getting approval for hold of that.  We however would need to know whether her new bruising and bleeding may be a result of progressive thrombocytopenia that may be preventative for [place] to doing procedures.  So we will plan for the patient to come for a CBC to ensure safe ability for [place] to consider endoscopic evaluations.  If she has a significant drop in her hemoglobin she may need an inpatient evaluation.   Scout Guyett,  Please reach out to the patient and let her know that we are going to work on scheduling her for her Lynch syndrome screening upper endoscopy (has been more than 3 years since her last one) and for her colonoscopy as she had been doing with Dr. Ardis Hughs.  Also get a little bit better assessment of what type of bleeding she is having per rectum.  We will need to get Plavix hold approval as you have done previously for DJ.  Also she needs to come in for a CBC for [place] to make sure that she has safe enough platelets to have a procedure done as an outpatient.  If we also find that her hemoglobin has significantly downtrended she may need follow-up with hematology/oncology and PCP versus need for transfusion.  Go ahead and work on scheduling this in the next 2 to 4 weeks with me.  Thanks.  GM        Previous Messages    ----- Message -----  From: Estella Husk, LPN  Sent: 5/42/7062   3:58 PM EDT  To: [name], MD; Irving Copas., MD   Good afternoon,   This patient reached out to our office today and stated that she has been having some bruising over multiple areas of her body and for the past 2 days she has been having some bleeding in her stool.  Patient has Lynch Syndrome and is due for  Colonoscopy.  Last one was performed in 12/2020.  Can you see this patient as soon as possible and discuss with Dr. [name].    Thank You    Joy P. LPN

## 2021-12-01 ENCOUNTER — Telehealth: Payer: Self-pay

## 2021-12-01 ENCOUNTER — Other Ambulatory Visit: Payer: Self-pay

## 2021-12-01 DIAGNOSIS — Z1509 Genetic susceptibility to other malignant neoplasm: Secondary | ICD-10-CM

## 2021-12-01 NOTE — Telephone Encounter (Signed)
Endo colon entered for 01/15/22 at 730 am at [facility] with GM  CBC entered  What type of bleeding??  Need plavix hold  Send prep  Need to enter instructions   Left message on machine to call back

## 2021-12-01 NOTE — Telephone Encounter (Signed)
I have left a detailed message advising as indicated. 

## 2021-12-01 NOTE — Telephone Encounter (Signed)
-----   Message from [employer], PA-C sent at 12/01/2021  1:10 PM EDT ----- Can you call her and just make sure she is aware of the results ----- Message ----- From: Interface, Rad Results In Sent: 11/30/2021   1:26 PM EDT To: Cassandra L Heilingoetter, PA-C

## 2021-12-04 ENCOUNTER — Telehealth: Payer: Self-pay

## 2021-12-04 MED ORDER — PEG 3350-KCL-NA BICARB-NACL 420 G PO SOLR: 4000.0000 mL | Freq: Once | ORAL | 0 refills | Status: AC

## 2021-12-04 NOTE — Telephone Encounter (Signed)
[name], Thank you for the update. Lets see what the blood count and platelet count looks like and can decide if we need to try to get this procedure scheduled sooner than October 30 but glad that her bleeding has stopped. Thanks. GM

## 2021-12-04 NOTE — Telephone Encounter (Signed)
[facility] Pre-operative Risk Assessment     Request for surgical clearance:     Endoscopy Procedure  What type of surgery is being performed?     Endo colon    When is this surgery scheduled?     01/15/22  What type of clearance is required ?   Pharmacy  Are there any medications that need to be held prior to surgery and how long? Plavix   Practice name and name of physician performing surgery?      [facility]  What is your office phone and fax number?      Phone- [phone]  [phone]  Anesthesia type (None, local, MAC, general) ?       MAC

## 2021-12-04 NOTE — Telephone Encounter (Signed)
EGD Colon scheduled, pt instructed and medications reviewed.  Patient instructions mailed to home and sent to My Chart.  Patient to call with any questions or concerns.  Her bleeding has stopped- she believes it was hemorrhoidal.    She will get CBC this week.  Order has been entered.   Plavix letter sent to cardiology

## 2021-12-07 ENCOUNTER — Encounter: Payer: Self-pay | Admitting: Gastroenterology

## 2021-12-07 DIAGNOSIS — D352 Benign neoplasm of pituitary gland: Secondary | ICD-10-CM | POA: Diagnosis not present

## 2021-12-07 DIAGNOSIS — E0590 Thyrotoxicosis, unspecified without thyrotoxic crisis or storm: Secondary | ICD-10-CM | POA: Diagnosis not present

## 2021-12-07 DIAGNOSIS — E230 Hypopituitarism: Secondary | ICD-10-CM | POA: Diagnosis not present

## 2021-12-07 DIAGNOSIS — M059 Rheumatoid arthritis with rheumatoid factor, unspecified: Secondary | ICD-10-CM | POA: Diagnosis not present

## 2021-12-07 DIAGNOSIS — E049 Nontoxic goiter, unspecified: Secondary | ICD-10-CM | POA: Diagnosis not present

## 2021-12-07 DIAGNOSIS — E210 Primary hyperparathyroidism: Secondary | ICD-10-CM | POA: Diagnosis not present

## 2021-12-07 DIAGNOSIS — E2740 Unspecified adrenocortical insufficiency: Secondary | ICD-10-CM | POA: Diagnosis not present

## 2021-12-07 DIAGNOSIS — I10 Essential (primary) hypertension: Secondary | ICD-10-CM | POA: Diagnosis not present

## 2021-12-07 NOTE — Telephone Encounter (Signed)
   Patient Name: Pamela Lowe  DOB: June 24, 1950 MRN: [id number]  Primary Cardiologist: Sinclair Grooms, MD  Chart reviewed as part of pre-operative protocol coverage. Last OV 05/2021, doing well from cardiac standpoint, 1 year follow-up recommended. Last cath/PCI was in 12/2018. Per 11/2020 phone note, patient cleared to hold antiplatelets at that time for meningioma surgery. Given no interim admission for MI/PCI, OK to hold Plavix as requested (typical duration is 5 days).  Will route this bundled recommendation to requesting provider via Epic fax function. Please call with questions.   Charlie Pitter, PA-C 12/07/2021, 12:05 PM

## 2021-12-08 ENCOUNTER — Ambulatory Visit (INDEPENDENT_AMBULATORY_CARE_PROVIDER_SITE_OTHER): Payer: Medicare Other | Admitting: Family Medicine

## 2021-12-08 ENCOUNTER — Telehealth: Payer: Self-pay

## 2021-12-08 DIAGNOSIS — Z23 Encounter for immunization: Secondary | ICD-10-CM | POA: Diagnosis not present

## 2021-12-08 NOTE — Progress Notes (Signed)
Pt presented to the office today to get high dose flu shot, no concerns from patient on this vaccine, did ask if the RSV vaccine was something she could have done her and I did inform her we can provide this to her however we do not advise giving this with other vaccines and I do need to verify with her provider that this it best for her, message sent to Dr Carlota Raspberry on this matter.

## 2021-12-08 NOTE — Telephone Encounter (Signed)
Called pt and advised she states she trust Dr Carlota Raspberry, notes she will call back and schedule when she is ready

## 2021-12-08 NOTE — Telephone Encounter (Signed)
RSV vaccine may be a good idea for her given her medical history.  Here is information per CDC she can look at to help decide.  [email or web address]

## 2021-12-08 NOTE — Telephone Encounter (Signed)
Patient received flu shot today and wanted me to ask if she should get the RSV vaccine.   Please advise and I can let her know

## 2021-12-11 NOTE — Telephone Encounter (Signed)
The pt has been advised of the 5 day hold.

## 2021-12-12 DIAGNOSIS — H209 Unspecified iridocyclitis: Secondary | ICD-10-CM | POA: Diagnosis not present

## 2021-12-12 DIAGNOSIS — H04123 Dry eye syndrome of bilateral lacrimal glands: Secondary | ICD-10-CM | POA: Diagnosis not present

## 2021-12-13 DIAGNOSIS — Z9889 Other specified postprocedural states: Secondary | ICD-10-CM | POA: Diagnosis not present

## 2021-12-13 DIAGNOSIS — D320 Benign neoplasm of cerebral meninges: Secondary | ICD-10-CM | POA: Diagnosis not present

## 2021-12-13 DIAGNOSIS — D329 Benign neoplasm of meninges, unspecified: Secondary | ICD-10-CM | POA: Diagnosis not present

## 2021-12-14 DIAGNOSIS — S83231A Complex tear of medial meniscus, current injury, right knee, initial encounter: Secondary | ICD-10-CM | POA: Diagnosis not present

## 2021-12-14 DIAGNOSIS — M25561 Pain in right knee: Secondary | ICD-10-CM | POA: Diagnosis not present

## 2021-12-14 DIAGNOSIS — M13861 Other specified arthritis, right knee: Secondary | ICD-10-CM | POA: Diagnosis not present

## 2021-12-14 DIAGNOSIS — M1711 Unilateral primary osteoarthritis, right knee: Secondary | ICD-10-CM | POA: Diagnosis not present

## 2021-12-14 DIAGNOSIS — G8929 Other chronic pain: Secondary | ICD-10-CM | POA: Diagnosis not present

## 2021-12-15 DIAGNOSIS — E210 Primary hyperparathyroidism: Secondary | ICD-10-CM | POA: Diagnosis not present

## 2021-12-15 DIAGNOSIS — M059 Rheumatoid arthritis with rheumatoid factor, unspecified: Secondary | ICD-10-CM | POA: Diagnosis not present

## 2021-12-15 DIAGNOSIS — E049 Nontoxic goiter, unspecified: Secondary | ICD-10-CM | POA: Diagnosis not present

## 2021-12-15 DIAGNOSIS — E230 Hypopituitarism: Secondary | ICD-10-CM | POA: Diagnosis not present

## 2021-12-15 DIAGNOSIS — D352 Benign neoplasm of pituitary gland: Secondary | ICD-10-CM | POA: Diagnosis not present

## 2021-12-18 ENCOUNTER — Encounter: Payer: Self-pay | Admitting: Hematology

## 2021-12-18 ENCOUNTER — Inpatient Hospital Stay: Payer: Medicare Other | Attending: Hematology | Admitting: Hematology

## 2021-12-18 ENCOUNTER — Inpatient Hospital Stay: Payer: Medicare Other | Admitting: Hematology

## 2021-12-18 VITALS — BP 143/68 | HR 50 | Temp 98.3°F | Resp 18 | Ht 63.0 in | Wt 210.2 lb

## 2021-12-18 DIAGNOSIS — Z1509 Genetic susceptibility to other malignant neoplasm: Secondary | ICD-10-CM | POA: Diagnosis not present

## 2021-12-18 DIAGNOSIS — Z7982 Long term (current) use of aspirin: Secondary | ICD-10-CM | POA: Insufficient documentation

## 2021-12-18 DIAGNOSIS — K5791 Diverticulosis of intestine, part unspecified, without perforation or abscess with bleeding: Secondary | ICD-10-CM

## 2021-12-18 DIAGNOSIS — R233 Spontaneous ecchymoses: Secondary | ICD-10-CM

## 2021-12-18 DIAGNOSIS — Z7902 Long term (current) use of antithrombotics/antiplatelets: Secondary | ICD-10-CM | POA: Insufficient documentation

## 2021-12-18 LAB — CBC WITH DIFFERENTIAL/PLATELET
Abs Immature Granulocytes: 0.01 10*3/uL (ref 0.00–0.07)
Basophils Absolute: 0.1 10*3/uL (ref 0.0–0.1)
Basophils Relative: 1 %
Eosinophils Absolute: 0.1 10*3/uL (ref 0.0–0.5)
Eosinophils Relative: 1 %
HCT: 41.8 % (ref 36.0–46.0)
Hemoglobin: 13.9 g/dL (ref 12.0–15.0)
Immature Granulocytes: 0 %
Lymphocytes Relative: 42 %
Lymphs Abs: 3 10*3/uL (ref 0.7–4.0)
MCH: 30.7 pg (ref 26.0–34.0)
MCHC: 33.3 g/dL (ref 30.0–36.0)
MCV: 92.3 fL (ref 80.0–100.0)
Monocytes Absolute: 0.5 10*3/uL (ref 0.1–1.0)
Monocytes Relative: 7 %
Neutro Abs: 3.6 10*3/uL (ref 1.7–7.7)
Neutrophils Relative %: 49 %
Platelets: 302 10*3/uL (ref 150–400)
RBC: 4.53 MIL/uL (ref 3.87–5.11)
RDW: 14.6 % (ref 11.5–15.5)
WBC: 7.1 10*3/uL (ref 4.0–10.5)
nRBC: 0 % (ref 0.0–0.2)

## 2021-12-18 LAB — APTT: aPTT: 24 seconds (ref 24–36)

## 2021-12-18 LAB — PROTIME-INR
INR: 0.9 (ref 0.8–1.2)
Prothrombin Time: 12.4 seconds (ref 11.4–15.2)

## 2021-12-18 LAB — FERRITIN: Ferritin: 28 ng/mL (ref 11–307)

## 2021-12-18 NOTE — Progress Notes (Signed)
[facility]   [phone] [phone]   Clinic Follow up Note   Patient Care Team: Wendie Agreste, MD as PCP - General (Family Medicine) Belva Crome, MD as PCP - Cardiology (Cardiology) Belva Crome, MD as Consulting Physician (Cardiology) Marylin Crosby, MD as Referring Physician (Ophthalmology) Ander Slade Carlisle Beers, MD as Referring Physician (Ophthalmology) Felecia Shelling, Nanine Means, MD (Neurology) Milus Banister, MD as Attending Physician (Gastroenterology) Lisabeth Pick, MD as Consulting Physician (Ophthalmology) Valinda Party, MD (Rheumatology) Jacelyn Pi, MD as Referring Physician (Endocrinology) Megan Salon, MD as Consulting Physician (Obstetrics and Gynecology) Wendee Beavers, Deidre Ala, MD as Referring Physician (Otolaryngology) Augusto Garbe, MD (Neurosurgery)  Date of Service:  12/18/2021  CHIEF COMPLAINT: f/u of Lynch Syndrome (MHL1+)  CURRENT THERAPY:  Surveillance  ASSESSMENT & PLAN:  Pamela Lowe is a 71 y.o. female with   1. easy bruising  -She was referred by her primary care physician for easy bruising for the past 3 months. -I think this is likely related to ASA and Plavix.  -We will repeat a CBC, PT, APTT.  -I do not think she needs further work-up, given the mild clinical symptoms. -Okay to continue baby aspirin and Plavix, given the mild symptoms.   2. Lynch Syndrome, MLH1 mutation heterozygous  -seen on genetic testing 12/2017. -we previously discussed cancer risks associated with MLH1 mutation. -she is scheduled for EGD with Dr. Rush Landmark on 01/15/22.   PLAN: -lab today for CBC, ferritin, PT, APTT, will call with results -She will see me as needed.   No problem-specific Assessment & Plan notes found for this encounter.   INTERVAL HISTORY:  Pamela Lowe is here for a follow up of Lynch syndrome. She was last seen by PA Cassie on 04/07/21. She presents to the clinic alone.  She was referred  back due to her easy bruising in the past 3 months.  She had a small hematoma in the left upper arm a few weeks ago, which has resolved.  Most bruising in her hands.  No significant gum bleeding or other bleeding.   All other systems were reviewed with the patient and are negative.  MEDICAL HISTORY:  Past Medical History:  Diagnosis Date   Allergic rhinitis    Allergy    Arthritis    Brain tumor ([place])    Breast cyst 2007   Right   Cataract    surgery 07/2017   Clotting disorder ([place])    On plavix since stents  for artery blockages   Depression 2006   w/ menopause   Family history of breast cancer    Family history of colon cancer    Family history of kidney cancer 01/15/2018   Family history of lung cancer    Family history of pancreatic cancer    Family history of prostate cancer    Family history of uterine cancer    GERD (gastroesophageal reflux disease)    Glaucoma    Heart murmur    History of hysterectomy, supracervical    Hyperlipidemia    Hypertension    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   MLH1 [id number] (Splice site)   Multinodular goiter    PAD (peripheral artery disease) ([place])    LE Art [place] 1/18: Occluded prox-mid R SFA // ABIs 9/22: R 0.83; L 1.0   Pustular psoriasis    Tobacco user     SURGICAL HISTORY: Past Surgical History:  Procedure Laterality Date   ABDOMINAL HYSTERECTOMY  1995  BRAIN TUMOR EXCISION  02/24/2021   at [facility]     CATARACT EXTRACTION Right 07/2017   COLONOSCOPY     CORONARY STENT INTERVENTION N/A 12/26/2018   Procedure: CORONARY STENT INTERVENTION;  Surgeon: Belva Crome, MD;  Location: [facility];  Service: Cardiovascular;  Laterality: N/A;   EYE SURGERY  2019, 2020   Cataracts, eye shunt implants   LEFT HEART CATH AND CORONARY ANGIOGRAPHY N/A 12/17/2018   Procedure: LEFT HEART CATH AND CORONARY ANGIOGRAPHY;  Surgeon: Belva Crome, MD;  Location: [facility];  Service: Cardiovascular;  Laterality: N/A;   SUPRACERVICAL ABDOMINAL HYSTERECTOMY  1986   h/o supracervical hysterectomy   UPPER GASTROINTESTINAL ENDOSCOPY      I have reviewed the social history and family history with the patient and they are unchanged from previous note.  ALLERGIES:  is allergic to sulfamethoxazole-trimethoprim, conjugated estrogens, and wellbutrin [bupropion hcl].  MEDICATIONS:  Current Outpatient Medications  Medication Sig Dispense Refill   AMBULATORY NON FORMULARY MEDICATION Apply 1 drop topically at bedtime. Terbinafine DMSO 3.34% susp     Ascorbic Acid (VITAMIN C) 500 MG CAPS Take 1 tablet by mouth daily.     aspirin 81 MG EC tablet Take 81 mg by mouth every evening.      B Complex Vitamins (VITAMIN B COMPLEX) TABS Take 1 tablet by mouth daily.     benazepril (LOTENSIN) 20 MG tablet TAKE 1 TABLET BY MOUTH DAILY 90 tablet 2   brimonidine (ALPHAGAN) 0.2 % ophthalmic solution Place 1 drop into both eyes 3 (three) times daily.      Calcium Carb-Cholecalciferol (CALCIUM-VITAMIN D) 600-400 MG-UNIT TABS Take 1 tablet by mouth daily.     clopidogrel (PLAVIX) 75 MG tablet Take 1 tablet (75 mg total) by mouth daily. 90 tablet 3   dorzolamide-timolol (COSOPT) 22.3-6.8 MG/ML ophthalmic solution Place 1 drop into the right eye 2 (two) times daily.     ezetimibe (ZETIA) 10 MG tablet TAKE 1 TABLET BY MOUTH DAILY 90 tablet 3   fexofenadine (ALLEGRA) 60 MG tablet Take 60 mg by mouth as needed for allergies or rhinitis.     folic acid (FOLVITE) 1 MG tablet Take 1 mg by mouth daily.      hydrochlorothiazide (MICROZIDE) 12.5 MG capsule Take 1 capsule (12.5 mg total) by mouth every Monday, Wednesday, and Friday. 45 capsule 3   hydrocortisone (CORTEF) 10 MG tablet Take 10 mg by mouth daily.     hyoscyamine (LEVSIN SL) 0.125 MG SL tablet DISSOLVE 1 TO 2 TABLETS IN MOUTH UNDER THE TONGUE TWICE DAILY AS NEEDED 50 tablet 1   methotrexate (RHEUMATREX) 2.5 MG tablet Take by mouth  once a week. Mondays Take 6 tablets     metoprolol succinate (TOPROL-XL) 25 MG 24 hr tablet Take 1 tablet by mouth once daily 90 tablet 2   Multiple Vitamins-Minerals (MULTIVITAMIN WITH MINERALS) tablet Take 1 tablet by mouth daily.     Omega-3 Fatty Acids (FISH OIL) 1000 MG CAPS Take 1 capsule by mouth daily.      pantoprazole (PROTONIX) 40 MG tablet Take 1 tablet (40 mg total) by mouth daily. 90 tablet 3   rosuvastatin (CRESTOR) 20 MG tablet TAKE 1 TABLET BY MOUTH DAILY 90 tablet 3   SIMPONI ARIA 50 MG/4ML SOLN injection Inject 50 mg into the vein. Every 60 Days     Specialty Vitamins Products (COLLAGEN ULTRA) CAPS Take 1 capsule by  mouth daily.     No current facility-administered medications for this visit.    PHYSICAL EXAMINATION: ECOG PERFORMANCE STATUS: 0 - Asymptomatic  Vitals:   12/18/21 1444  BP: (!) 143/68  Pulse: (!) 50  Resp: 18  Temp: 98.3 F (36.8 C)  SpO2: 100%   Wt Readings from Last 3 Encounters:  12/18/21 210 lb 3.2 oz (95.3 kg)  11/22/21 209 lb 8 oz (95 kg)  11/16/21 202 lb (91.6 kg)     GENERAL:alert, no distress and comfortable SKIN: skin color, texture, turgor are normal, no rashes or significant lesions, except a mild ecchymosis in right upper arm, and a small area of discoloration in the left upper arm. Musculoskeletal:no cyanosis of digits and no clubbing  NEURO: alert & oriented x 3 with fluent speech, no focal motor/sensory deficits  LABORATORY DATA:  I have reviewed the data as listed    Latest Ref Rng & Units 12/18/2021    3:07 PM 09/14/2021    2:14 PM 11/02/2019    6:14 PM  CBC  WBC 4.0 - 10.5 K/uL 7.1  5.7  7.2   Hemoglobin 12.0 - 15.0 g/dL 13.9  13.0  13.3   Hematocrit 36.0 - 46.0 % 41.8  40.4  42.2   Platelets 150 - 400 K/uL 302  297.0  303         Latest Ref Rng & Units 09/27/2021    9:00 AM 07/26/2021    1:03 PM 05/29/2021   11:36 AM  CMP  Glucose 70 - 99 mg/dL 103  107    BUN 8 - 27 mg/dL 13  21    Creatinine 0.57 - 1.00 mg/dL  1.03  1.04    Sodium 134 - 144 mmol/L 140  139    Potassium 3.5 - 5.2 mmol/L 4.2  3.7    Chloride 96 - 106 mmol/L 103  103    CO2 20 - 29 mmol/L 24  30    Calcium 8.7 - 10.3 mg/dL 9.7  9.8    Total Protein 6.0 - 8.5 g/dL 6.4   6.3   Total Bilirubin 0.0 - 1.2 mg/dL 0.8   0.7   Alkaline Phos 44 - 121 IU/L 65   64   AST 0 - 40 IU/L 36   39   ALT 0 - 32 IU/L 33   50       RADIOGRAPHIC STUDIES: I have personally reviewed the radiological images as listed and agreed with the findings in the report. No results found.    Orders Placed This Encounter  Procedures   CBC with Differential/Platelet    Standing Status:   Standing    Number of Occurrences:   50    Standing Expiration Date:   12/19/2022   Protime-INR    Standing Status:   Future    Number of Occurrences:   1    Standing Expiration Date:   12/19/2022   APTT    Standing Status:   Future    Number of Occurrences:   1    Standing Expiration Date:   12/18/2022   Ferritin    Standing Status:   Future    Number of Occurrences:   1    Standing Expiration Date:   12/18/2022   All questions were answered. The patient knows to call the clinic with any problems, questions or concerns. No barriers to learning was detected. The total time spent in the appointment was 30 minutes.     Krista Blue  Burr Medico, MD 12/18/2021   I, Wilburn Mylar, am acting as scribe for [name], MD.   I have reviewed the above documentation for accuracy and completeness, and I agree with the above.

## 2021-12-20 ENCOUNTER — Ambulatory Visit: Payer: Medicare Other | Admitting: Podiatry

## 2021-12-22 DIAGNOSIS — D329 Benign neoplasm of meninges, unspecified: Secondary | ICD-10-CM | POA: Diagnosis not present

## 2021-12-27 ENCOUNTER — Ambulatory Visit (INDEPENDENT_AMBULATORY_CARE_PROVIDER_SITE_OTHER): Payer: Medicare Other | Admitting: Bariatrics

## 2021-12-27 ENCOUNTER — Encounter: Payer: Self-pay | Admitting: Bariatrics

## 2021-12-27 VITALS — BP 143/84 | HR 57 | Temp 98.1°F | Ht 63.0 in | Wt 205.0 lb

## 2021-12-27 DIAGNOSIS — E669 Obesity, unspecified: Secondary | ICD-10-CM | POA: Diagnosis not present

## 2021-12-27 DIAGNOSIS — Z6836 Body mass index (BMI) 36.0-36.9, adult: Secondary | ICD-10-CM | POA: Diagnosis not present

## 2021-12-27 DIAGNOSIS — F509 Eating disorder, unspecified: Secondary | ICD-10-CM | POA: Insufficient documentation

## 2021-12-27 DIAGNOSIS — R7303 Prediabetes: Secondary | ICD-10-CM | POA: Diagnosis not present

## 2021-12-27 DIAGNOSIS — F5089 Other specified eating disorder: Secondary | ICD-10-CM

## 2021-12-27 MED ORDER — TOPIRAMATE 50 MG PO TABS: 50.0000 mg | ORAL_TABLET | Freq: Every day | ORAL | 0 refills | Status: DC

## 2022-01-01 ENCOUNTER — Encounter: Payer: Self-pay | Admitting: Bariatrics

## 2022-01-01 DIAGNOSIS — M5137 Other intervertebral disc degeneration, lumbosacral region: Secondary | ICD-10-CM | POA: Diagnosis not present

## 2022-01-01 DIAGNOSIS — M47819 Spondylosis without myelopathy or radiculopathy, site unspecified: Secondary | ICD-10-CM | POA: Diagnosis not present

## 2022-01-01 DIAGNOSIS — L4050 Arthropathic psoriasis, unspecified: Secondary | ICD-10-CM | POA: Diagnosis not present

## 2022-01-01 DIAGNOSIS — M25569 Pain in unspecified knee: Secondary | ICD-10-CM | POA: Diagnosis not present

## 2022-01-01 DIAGNOSIS — E669 Obesity, unspecified: Secondary | ICD-10-CM | POA: Diagnosis not present

## 2022-01-01 DIAGNOSIS — Z79899 Other long term (current) drug therapy: Secondary | ICD-10-CM | POA: Diagnosis not present

## 2022-01-01 DIAGNOSIS — M1990 Unspecified osteoarthritis, unspecified site: Secondary | ICD-10-CM | POA: Diagnosis not present

## 2022-01-01 NOTE — Progress Notes (Signed)
Chief Complaint:   OBESITY Pamela Lowe is here to discuss her progress with her obesity treatment plan along with follow-up of her obesity related diagnoses. Pamela Lowe is on the Category 1 Plan and states she is following her eating plan approximately 75% of the time. Pamela Lowe states she is walking for 30-45 minutes 5 times per week.  Today's visit was #: 4 Starting weight: 213 lbs Starting date: 09/27/2021 Today's weight: 204 lbs Today's date: 12/27/2021 Total lbs lost to date: 9 Total lbs lost since last in-office visit: 0  Interim History: Pamela Lowe is up 2 lbs since her last visit. She has been on vacation.   Subjective:   1. Prediabetes Pamela Lowe is not on medications currently.   2. Other disorder of eating Pamela Lowe notes some stress eating.   Assessment/Plan:   1. Prediabetes Pamela Lowe will keep her carbohydrates low (sweets and starches).   2. Other disorder of eating Pamela Lowe agreed to start Topamax 50 mg daily at 4 pm, with no refills. We discussed eating out. She will not skip meals. She will work on increasing her protein, healthy fats, and fiber intake.   - Pamela Lowe (TOPAMAX) 50 MG tablet; Take 1 tablet (50 mg total) by mouth daily. At 4:00 pm  Dispense: 30 tablet; Refill: 0  3. Obesity, current BMI 36.2 Pamela Lowe is currently in the action stage of change. As such, her goal is to continue with weight loss efforts. She has agreed to the Category 1 Plan.   She will adhere to the plan 80-90%. She will increase fruit and vegetables.   Exercise goals: As is.   Behavioral modification strategies: increasing lean protein intake, decreasing simple carbohydrates, increasing vegetables, increasing water intake, decreasing eating out, no skipping meals, meal planning and cooking strategies, keeping healthy foods in the home, and planning for success.  Pamela Lowe has agreed to follow-up with our clinic in 2 to 3 weeks. She was informed of the importance of frequent follow-up visits to maximize  her success with intensive lifestyle modifications for her multiple health conditions.   Objective:   Blood pressure (!) 143/84, pulse (!) 57, temperature 98.1 F (36.7 C), height '5\' 3"'$  (1.6 m), weight 205 lb (93 kg), last menstrual period 03/19/1998, SpO2 100 %. Body mass index is 36.31 kg/m.  General: Cooperative, alert, well developed, in no acute distress. HEENT: Conjunctivae and lids unremarkable. Cardiovascular: Regular rhythm.  Lungs: Normal work of breathing. Neurologic: No focal deficits.   Lab Results  Component Value Date   CREATININE 1.03 (H) 09/27/2021   BUN 13 09/27/2021   NA 140 09/27/2021   K 4.2 09/27/2021   CL 103 09/27/2021   CO2 24 09/27/2021   Lab Results  Component Value Date   ALT 33 (H) 09/27/2021   AST 36 09/27/2021   ALKPHOS 65 09/27/2021   BILITOT 0.8 09/27/2021   Lab Results  Component Value Date   HGBA1C 5.6 09/27/2021   HGBA1C 5.7 07/26/2021   HGBA1C 5.8 (A) 04/05/2021   Lab Results  Component Value Date   INSULIN 12.2 09/27/2021   Lab Results  Component Value Date   TSH 0.823 09/27/2021   Lab Results  Component Value Date   CHOL 148 09/27/2021   HDL 83 09/27/2021   LDLCALC 49 09/27/2021   TRIG 85 09/27/2021   CHOLHDL 1.9 05/29/2021   Lab Results  Component Value Date   VD25OH 30.3 09/27/2021   VD25OH 40.6 08/10/2021   VD25OH 41.0 09/21/2016   Lab Results  Component Value  Date   WBC 7.1 12/18/2021   HGB 13.9 12/18/2021   HCT 41.8 12/18/2021   MCV 92.3 12/18/2021   PLT 302 12/18/2021   Lab Results  Component Value Date   FERRITIN 28 12/18/2021   Attestation Statements:   Reviewed by clinician on day of visit: allergies, medications, problem list, medical history, surgical history, family history, social history, and previous encounter notes.   Wilhemena Durie, am acting as Location manager for [employer], DO.  I have reviewed the above documentation for accuracy and completeness, and I agree with the above. Jearld Lesch, DO

## 2022-01-03 ENCOUNTER — Other Ambulatory Visit: Payer: Self-pay | Admitting: Endocrinology

## 2022-01-03 DIAGNOSIS — E049 Nontoxic goiter, unspecified: Secondary | ICD-10-CM

## 2022-01-08 ENCOUNTER — Encounter (HOSPITAL_COMMUNITY): Payer: Self-pay | Admitting: Gastroenterology

## 2022-01-08 ENCOUNTER — Other Ambulatory Visit: Payer: Self-pay | Admitting: Endocrinology

## 2022-01-08 DIAGNOSIS — E049 Nontoxic goiter, unspecified: Secondary | ICD-10-CM

## 2022-01-12 ENCOUNTER — Ambulatory Visit
Admission: RE | Admit: 2022-01-12 | Discharge: 2022-01-12 | Disposition: A | Discharge status: Home / Self Care | Payer: Medicare Other | Source: Ambulatory Visit | Attending: Endocrinology | Admitting: Endocrinology

## 2022-01-12 DIAGNOSIS — E049 Nontoxic goiter, unspecified: Secondary | ICD-10-CM

## 2022-01-14 NOTE — Anesthesia Preprocedure Evaluation (Signed)
Anesthesia Evaluation  Patient identified by MRN, date of birth, ID band Patient awake    Reviewed: Allergy & Precautions, NPO status , Patient's Chart, lab work & pertinent test results, reviewed documented beta blocker date and time   History of Anesthesia Complications Negative for: history of anesthetic complications  Airway Mallampati: II  TM Distance: >3 FB Neck ROM: Full    Dental  (+) Dental Advisory Given, Caps   Pulmonary former smoker,    breath sounds clear to auscultation       Cardiovascular hypertension, Pt. on medications and Pt. on home beta blockers + CAD (LAD), + Cardiac Stents and + Peripheral Vascular Disease   Rhythm:Regular Rate:Normal  '20 ECHO: EF 60-65%, mild LVH, normal LVF, normal RVF, mild aortic valve thickening, no significant valvular abnormalities   Neuro/Psych Depression S/p pituitary tumor removal: transsphenoidal    GI/Hepatic Neg liver ROS, GERD  Medicated and Controlled,  Endo/Other  Cortisone s/p adenoma/hypophysectomy BMI 31.8  Renal/GU negative Renal ROS     Musculoskeletal  (+) Arthritis ,   Abdominal (+) + obese,   Peds  Hematology plavix   Anesthesia Other Findings   Reproductive/Obstetrics                            Anesthesia Physical Anesthesia Plan  ASA: 3  Anesthesia Plan: MAC   Post-op Pain Management: Minimal or no pain anticipated   Induction:   PONV Risk Score and Plan: 2 and Ondansetron and Treatment may vary due to age or medical condition  Airway Management Planned: Natural Airway and Nasal Cannula  Additional Equipment: None  Intra-op Plan:   Post-operative Plan:   Informed Consent: I have reviewed the patients History and Physical, chart, labs and discussed the procedure including the risks, benefits and alternatives for the proposed anesthesia with the patient or authorized representative who has indicated his/her  understanding and acceptance.     Dental advisory given  Plan Discussed with: CRNA and Surgeon  Anesthesia Plan Comments:        Anesthesia Quick Evaluation

## 2022-01-15 ENCOUNTER — Encounter (HOSPITAL_COMMUNITY): Payer: Self-pay | Admitting: Gastroenterology

## 2022-01-15 ENCOUNTER — Encounter (HOSPITAL_COMMUNITY)
Admission: RE | Disposition: A | Discharge status: Home / Self Care | Payer: Self-pay | Source: Home / Self Care | Attending: Gastroenterology

## 2022-01-15 ENCOUNTER — Ambulatory Visit (HOSPITAL_BASED_OUTPATIENT_CLINIC_OR_DEPARTMENT_OTHER): Payer: Medicare Other | Admitting: Certified Registered"

## 2022-01-15 ENCOUNTER — Other Ambulatory Visit: Payer: Self-pay

## 2022-01-15 ENCOUNTER — Ambulatory Visit (HOSPITAL_COMMUNITY)
Admission: RE | Admit: 2022-01-15 | Discharge: 2022-01-15 | Disposition: A | Discharge status: Home / Self Care | Payer: Medicare Other | Attending: Gastroenterology | Admitting: Gastroenterology

## 2022-01-15 ENCOUNTER — Ambulatory Visit (HOSPITAL_COMMUNITY): Payer: Medicare Other | Admitting: Certified Registered"

## 2022-01-15 DIAGNOSIS — D125 Benign neoplasm of sigmoid colon: Secondary | ICD-10-CM | POA: Insufficient documentation

## 2022-01-15 DIAGNOSIS — K449 Diaphragmatic hernia without obstruction or gangrene: Secondary | ICD-10-CM

## 2022-01-15 DIAGNOSIS — K209 Esophagitis, unspecified without bleeding: Secondary | ICD-10-CM | POA: Diagnosis not present

## 2022-01-15 DIAGNOSIS — M1990 Unspecified osteoarthritis, unspecified site: Secondary | ICD-10-CM

## 2022-01-15 DIAGNOSIS — K641 Second degree hemorrhoids: Secondary | ICD-10-CM | POA: Insufficient documentation

## 2022-01-15 DIAGNOSIS — K5730 Diverticulosis of large intestine without perforation or abscess without bleeding: Secondary | ICD-10-CM | POA: Insufficient documentation

## 2022-01-15 DIAGNOSIS — Z08 Encounter for follow-up examination after completed treatment for malignant neoplasm: Secondary | ICD-10-CM | POA: Diagnosis not present

## 2022-01-15 DIAGNOSIS — Z1509 Genetic susceptibility to other malignant neoplasm: Secondary | ICD-10-CM | POA: Insufficient documentation

## 2022-01-15 DIAGNOSIS — R12 Heartburn: Secondary | ICD-10-CM | POA: Insufficient documentation

## 2022-01-15 DIAGNOSIS — K3189 Other diseases of stomach and duodenum: Secondary | ICD-10-CM

## 2022-01-15 DIAGNOSIS — K31819 Angiodysplasia of stomach and duodenum without bleeding: Secondary | ICD-10-CM | POA: Diagnosis not present

## 2022-01-15 DIAGNOSIS — Z6831 Body mass index (BMI) 31.0-31.9, adult: Secondary | ICD-10-CM | POA: Diagnosis not present

## 2022-01-15 DIAGNOSIS — Z85038 Personal history of other malignant neoplasm of large intestine: Secondary | ICD-10-CM

## 2022-01-15 DIAGNOSIS — Z1211 Encounter for screening for malignant neoplasm of colon: Secondary | ICD-10-CM | POA: Diagnosis not present

## 2022-01-15 DIAGNOSIS — I2510 Atherosclerotic heart disease of native coronary artery without angina pectoris: Secondary | ICD-10-CM | POA: Diagnosis not present

## 2022-01-15 DIAGNOSIS — K219 Gastro-esophageal reflux disease without esophagitis: Secondary | ICD-10-CM | POA: Insufficient documentation

## 2022-01-15 DIAGNOSIS — Z7902 Long term (current) use of antithrombotics/antiplatelets: Secondary | ICD-10-CM | POA: Diagnosis not present

## 2022-01-15 DIAGNOSIS — K644 Residual hemorrhoidal skin tags: Secondary | ICD-10-CM | POA: Insufficient documentation

## 2022-01-15 DIAGNOSIS — F32A Depression, unspecified: Secondary | ICD-10-CM | POA: Insufficient documentation

## 2022-01-15 DIAGNOSIS — Z87891 Personal history of nicotine dependence: Secondary | ICD-10-CM | POA: Diagnosis not present

## 2022-01-15 DIAGNOSIS — E669 Obesity, unspecified: Secondary | ICD-10-CM | POA: Insufficient documentation

## 2022-01-15 DIAGNOSIS — I10 Essential (primary) hypertension: Secondary | ICD-10-CM | POA: Diagnosis not present

## 2022-01-15 DIAGNOSIS — K635 Polyp of colon: Secondary | ICD-10-CM

## 2022-01-15 HISTORY — PX: ESOPHAGOGASTRODUODENOSCOPY (EGD) WITH PROPOFOL: SHX5813

## 2022-01-15 HISTORY — DX: Family history of other specified conditions: Z84.89

## 2022-01-15 HISTORY — PX: POLYPECTOMY: SHX5525

## 2022-01-15 HISTORY — PX: COLONOSCOPY WITH PROPOFOL: SHX5780

## 2022-01-15 HISTORY — PX: BIOPSY: SHX5522

## 2022-01-15 SURGERY — ESOPHAGOGASTRODUODENOSCOPY (EGD) WITH PROPOFOL
Anesthesia: Monitor Anesthesia Care

## 2022-01-15 MED ORDER — SODIUM CHLORIDE 0.9 % IV SOLN: INTRAVENOUS | Status: DC

## 2022-01-15 MED ORDER — PROPOFOL 10 MG/ML IV BOLUS
INTRAVENOUS | Status: DC | PRN
  Administered 2022-01-15: 20 mg via INTRAVENOUS

## 2022-01-15 MED ORDER — EPHEDRINE SULFATE-NACL 50-0.9 MG/10ML-% IV SOSY
PREFILLED_SYRINGE | INTRAVENOUS | Status: DC | PRN
  Administered 2022-01-15 (×2): 5 mg via INTRAVENOUS

## 2022-01-15 MED ORDER — CLOPIDOGREL BISULFATE 75 MG PO TABS: 75.0000 mg | ORAL_TABLET | Freq: Every day | ORAL | 3 refills | Status: DC

## 2022-01-15 MED ORDER — PROPOFOL 1000 MG/100ML IV EMUL
INTRAVENOUS | Status: AC
  Filled 2022-01-15: qty 100

## 2022-01-15 MED ORDER — LACTATED RINGERS IV SOLN: INTRAVENOUS | Status: DC

## 2022-01-15 MED ORDER — METHYLPREDNISOLONE SODIUM SUCC 125 MG IJ SOLR
INTRAMUSCULAR | Status: DC | PRN
  Administered 2022-01-15: 125 mg via INTRAVENOUS

## 2022-01-15 MED ORDER — PROPOFOL 500 MG/50ML IV EMUL
INTRAVENOUS | Status: AC
  Filled 2022-01-15: qty 50

## 2022-01-15 MED ORDER — PROPOFOL 500 MG/50ML IV EMUL
INTRAVENOUS | Status: DC | PRN
  Administered 2022-01-15: 125 ug/kg/min via INTRAVENOUS

## 2022-01-15 MED ORDER — LIDOCAINE 2% (20 MG/ML) 5 ML SYRINGE
INTRAMUSCULAR | Status: DC | PRN
  Administered 2022-01-15: 100 mg via INTRAVENOUS

## 2022-01-15 MED ORDER — METOPROLOL SUCCINATE ER 25 MG PO TB24: 25.0000 mg | ORAL_TABLET | Freq: Every day | ORAL | 1 refills | Status: DC

## 2022-01-15 MED ORDER — LACTATED RINGERS IV SOLN: INTRAVENOUS | Status: DC | PRN

## 2022-01-15 SURGICAL SUPPLY — 25 items

## 2022-01-15 NOTE — H&P (Signed)
GASTROENTEROLOGY PROCEDURE H&P NOTE   Primary Care Physician: Wendie Agreste, MD  HPI: Pamela Lowe is a 71 y.o. female who presents for EGD/Colonoscopy in setting of Lynch Syndrome and history of rectal bleeding.  Past Medical History:  Diagnosis Date   Allergic rhinitis    Allergy    Arthritis    Brain tumor ([place])    Breast cyst 2007   Right   Cataract    surgery 07/2017   Clotting disorder ([place])    On plavix since stents  for artery blockages   Depression 2006   w/ menopause   Family history of adverse reaction to anesthesia    sister PONV   Family history of breast cancer    Family history of colon cancer    Family history of kidney cancer 01/15/2018   Family history of lung cancer    Family history of pancreatic cancer    Family history of prostate cancer    Family history of uterine cancer    GERD (gastroesophageal reflux disease)    Glaucoma    Heart murmur    History of hysterectomy, supracervical    Hyperlipidemia    Hypertension    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   MLH1 [id number] (Splice site)   Multinodular goiter    PAD (peripheral artery disease) ([place])    LE Art [place] 1/18: Occluded prox-mid R SFA // ABIs 9/22: R 0.83; L 1.0   Pustular psoriasis    Tobacco user    Past Surgical History:  Procedure Laterality Date   ABDOMINAL HYSTERECTOMY  1995   BRAIN TUMOR EXCISION  02/24/2021   at [facility]     CATARACT EXTRACTION Right 07/2017   COLONOSCOPY     CORONARY STENT INTERVENTION N/A 12/26/2018   Procedure: CORONARY STENT INTERVENTION;  Surgeon: Belva Crome, MD;  Location: [facility];  Service: Cardiovascular;  Laterality: N/A;   EYE SURGERY  2019, 2020   Cataracts, eye shunt implants   LEFT HEART CATH AND CORONARY ANGIOGRAPHY N/A 12/17/2018   Procedure: LEFT HEART CATH AND CORONARY ANGIOGRAPHY;  Surgeon: Belva Crome, MD;  Location: [facility];  Service:  Cardiovascular;  Laterality: N/A;   SUPRACERVICAL ABDOMINAL HYSTERECTOMY  1986   h/o supracervical hysterectomy   UPPER GASTROINTESTINAL ENDOSCOPY     Current Facility-Administered Medications  Medication Dose Route Frequency Provider Last Rate Last Admin   0.9 %  sodium chloride infusion   Intravenous Continuous Mansouraty, Telford Nab., MD       lactated ringers infusion   Intravenous Continuous Mansouraty, Telford Nab., MD 10 mL/hr at 01/15/22 0705 New Bag at 01/15/22 0705    Current Facility-Administered Medications:    0.9 %  sodium chloride infusion, , Intravenous, Continuous, Mansouraty, Telford Nab., MD   lactated ringers infusion, , Intravenous, Continuous, Mansouraty, Telford Nab., MD, Last Rate: 10 mL/hr at 01/15/22 0705, New Bag at 01/15/22 0705 Allergies  Allergen Reactions   Sulfamethoxazole-Trimethoprim Other (See Comments) and Rash    Chills/achy Flu like symptoms    Conjugated Estrogens     Other reaction(s): rash   Wellbutrin [Bupropion Hcl] Rash    rash   Family History  Problem Relation Age of Onset   Hypertension Mother    Deep vein thrombosis Mother    Alzheimer's disease Mother    Osteopenia Mother    Arthritis Mother    Varicose Veins Mother    Kidney cancer Father  41   Pneumonia Father    Hypertension Father    Arthritis Father    Early death Father    Kidney disease Father    Breast cancer Sister 94       recurrence 44   Cancer Sister    Breast cancer Sister 74       Stage 0   Cancer Sister    Breast cancer Sister 97       Stage 3, bilateral mastectomy   Cancer Sister    Early death Sister    Deep vein thrombosis Sister            Allergies Brother    Early death Brother    Heart attack Brother 79   Breast cancer Maternal Aunt 67   Prostate cancer Maternal Uncle 68   Lung cancer Maternal Uncle 60   Pancreatic cancer Paternal Aunt 64   Pancreatic cancer Paternal Aunt 16   Brain cancer Paternal Aunt    Pancreatic cancer Paternal Uncle 28    Lung cancer Paternal Uncle 76   Heart attack Maternal Grandfather    Uterine cancer Paternal Grandmother 66   Colon cancer Paternal Grandfather 69   Kidney cancer Cousin 25   Kidney cancer Cousin 52   Uterine cancer Cousin 62   Prostate cancer Cousin 57   Breast cancer Cousin 60   Thyroid disease Neg Hx    Rectal cancer Neg Hx    Stomach cancer Neg Hx    Social History   Socioeconomic History   Marital status: Married    Spouse name: [name]   Number of children: 0   Years of education: Not on file   Highest education level: Master's degree (e.g., MA, MS, MEng, MEd, MSW, MBA)  Occupational History   Occupation: Special Ed Pharmacist, hospital    Comment: Engineer, manufacturing  Tobacco Use   Smoking status: Former    Packs/day: 0.25    Years: 36.00    Total pack years: 9.00    Types: Cigarettes    Quit date: 08/01/2017    Years since quitting: 4.4   Smokeless tobacco: Never   Tobacco comments:    Started smoking at about 71 yrs old  Vaping Use   Vaping Use: Never used  Substance and Sexual Activity   Alcohol use: Not Currently   Drug use: No   Sexual activity: Not Currently    Partners: Male    Birth control/protection: Post-menopausal, Surgical    Comment: Hysterectomy  Other Topics Concern   Not on file  Social History Narrative   Not on file   Social Determinants of Health   Financial Resource Strain: Low Risk  (08/03/2021)   Overall Financial Resource Strain (CARDIA)    Difficulty of Paying Living Expenses: Not hard at all  Food Insecurity: No Food Insecurity (08/03/2021)   Hunger Vital Sign    Worried About Running Out of Food in the Last Year: Never true    [employer] in the Last Year: Never true  Transportation Needs: No Transportation Needs (08/03/2021)   PRAPARE - Hydrologist (Medical): No    Lack of Transportation (Non-Medical): No  Physical Activity: Sufficiently Active (08/03/2021)   Exercise Vital Sign    Days of Exercise per  Week: 6 days    Minutes of Exercise per Session: 30 min  Stress: No Stress Concern Present (08/03/2021)   [place]    Feeling  of Stress : Not at all  Social Connections: Moderately Integrated (08/03/2021)   Social Connection and Isolation Panel [NHANES]    Frequency of Communication with Friends and Family: More than three times a week    Frequency of Social Gatherings with Friends and Family: More than three times a week    Attends Religious Services: More than 4 times per year    Active Member of [employer] or Organizations: No    Attends Archivist Meetings: Never    Marital Status: Married  Human resources officer Violence: Not At Risk (08/03/2021)   Humiliation, Afraid, Rape, and Kick questionnaire    Fear of Current or Ex-Partner: No    Emotionally Abused: No    Physically Abused: No    Sexually Abused: No    Physical Exam: Today's Vitals   01/15/22 0637  BP: 132/74  Pulse: (!) 46  Resp: 20  Temp: 97.9 F (36.6 C)  TempSrc: Tympanic  SpO2: 99%  Weight: 92.1 kg  Height: _0  (1.702 m)   Body mass index is 31.79 kg/m. GEN: NAD EYE: Sclerae anicteric ENT: MMM CV: Non-tachycardic GI: Soft, NT/ND NEURO:  Alert & Oriented x 3  Lab Results: No results for input(s): "WBC", "HGB", "HCT", "PLT" in the last 72 hours. BMET No results for input(s): "NA", "K", "CL", "CO2", "GLUCOSE", "BUN", "CREATININE", "CALCIUM" in the last 72 hours. LFT No results for input(s): "PROT", "ALBUMIN", "AST", "ALT", "ALKPHOS", "BILITOT", "BILIDIR", "IBILI" in the last 72 hours. PT/INR No results for input(s): "LABPROT", "INR" in the last 72 hours.   Impression / Plan: This is a 71 y.o.female who presents for EGD/Colonoscopy in setting of Lynch Syndrome and history of rectal bleeding.  The risks and benefits of endoscopic evaluation/treatment were discussed with the patient and/or family; these include but are not limited to  the risk of perforation, infection, bleeding, missed lesions, lack of diagnosis, severe illness requiring hospitalization, as well as anesthesia and sedation related illnesses.  The patient's history has been reviewed, patient examined, no change in status, and deemed stable for procedure.  The patient and/or family is agreeable to proceed.    Justice Britain, MD [facility] Office # [phone]

## 2022-01-15 NOTE — Anesthesia Postprocedure Evaluation (Signed)
Anesthesia Post Note  Patient: Pamela Lowe  Procedure(s) Performed: ESOPHAGOGASTRODUODENOSCOPY (EGD) WITH PROPOFOL COLONOSCOPY WITH PROPOFOL BIOPSY POLYPECTOMY     Patient location during evaluation: Endoscopy Anesthesia Type: MAC Level of consciousness: awake and alert, patient cooperative and oriented Pain management: pain level controlled Vital Signs Assessment: post-procedure vital signs reviewed and stable Respiratory status: nonlabored ventilation, spontaneous breathing and respiratory function stable Cardiovascular status: stable and blood pressure returned to baseline Postop Assessment: no apparent nausea or vomiting Anesthetic complications: no   No notable events documented.  Last Vitals:  Vitals:   01/15/22 0637 01/15/22 0831  BP: 132/74 (!) 106/56  Pulse: (!) 46 60  Resp: 20 18  Temp: 36.6 C 36.6 C  SpO2: 99% 98%    Last Pain:  Vitals:   01/15/22 0831  TempSrc: Tympanic  PainSc: 0-No pain                 Colene Mines,E. Eilis Chestnutt

## 2022-01-15 NOTE — Op Note (Signed)
[facility] Patient Name: Pamela Lowe Procedure Date: 01/15/2022 MRN: [id number] Attending MD: Justice Britain , MD, [id number] Date of Birth: [birth year] CSN: [id number] Age: 71 Admit Type: Outpatient Procedure:                Upper GI endoscopy Indications:              Surveillance for malignancy secondary to Lynch                            Syndrome, Heartburn Providers:                Justice Britain, MD, Doristine Johns, RN, Cletis Athens, Technician Referring MD:             Milus Banister, MD, [name], Ranell Patrick. Carlota Raspberry,                            MD Medicines:                Monitored Anesthesia Care Complications:            No immediate complications. Estimated Blood Loss:     Estimated blood loss was minimal. Procedure:                Pre-Anesthesia Assessment:                           - Prior to the procedure, a History and Physical                            was performed, and patient medications and                            allergies were reviewed. The patient's tolerance of                            previous anesthesia was also reviewed. The risks                            and benefits of the procedure and the sedation                            options and risks were discussed with the patient.                            All questions were answered, and informed consent                            was obtained. Prior Anticoagulants: The patient has                            taken Plavix (clopidogrel), last dose was 5 days  prior to procedure. ASA Grade Assessment: III - A                            patient with severe systemic disease. After                            reviewing the risks and benefits, the patient was                            deemed in satisfactory condition to undergo the                            procedure.                           After obtaining informed  consent, the endoscope was                            passed under direct vision. Throughout the                            procedure, the patient's blood pressure, pulse, and                            oxygen saturations were monitored continuously. The                            [id number]-H190 ([id number]) Olympus endoscope was introduced                            through the mouth, and advanced to the second part                            of duodenum. The upper GI endoscopy was                            accomplished without difficulty. The patient                            tolerated the procedure. Scope In: Scope Out: Findings:      No gross lesions were noted in the proximal esophagus and in the mid       esophagus.      LA Grade A (one or more mucosal breaks less than 5 mm, not extending       between tops of 2 mucosal folds) esophagitis with no bleeding was found       in the very distal esophagus and at the GE junction.      The Z-line was regular and was found 40 cm from the incisors.      A 2 cm hiatal hernia was present.      Striped mildly erythematous mucosa without bleeding was found in the       gastric antrum.      No other gross lesions were noted in the entire examined stomach.       Biopsies were taken with a  cold forceps for histology and Helicobacter       pylori testing.      No gross lesions were noted in the duodenal bulb, in the first portion       of the duodenum and in the second portion of the duodenum. Impression:               - No gross lesions in the proximal esophagus and in                            the mid esophagus. LA Grade A esophagitis with no                            bleeding found in the very distal esophagus/GE                            junction.                           - Z-line regular, 40 cm from the incisors.                           - 2 cm hiatal hernia.                           - Erythematous mucosa in the antrum. No other gross                             lesions in the entire stomach. Biopsied.                           - No gross lesions in the duodenal bulb, in the                            first portion of the duodenum and in the second                            portion of the duodenum. Moderate Sedation:      Not Applicable - Patient had care per Anesthesia. Recommendation:           - Proceed to scheduled colonoscopy.                           - Consider restarting PPI 20 mg daily.                           - Continue present medications.                           - Await pathology results.                           - Repeat upper endoscopy in 2 years for                            surveillance due to underlying Lynch syndrome.                           -  The findings and recommendations were discussed                            with the patient.                           - The findings and recommendations were discussed                            with the patient's family. Procedure Code(s):        --- Professional ---                           [phone], Esophagogastroduodenoscopy, flexible,                            transoral; with biopsy, single or multiple Diagnosis Code(s):        --- Professional ---                           K20.90, Esophagitis, unspecified without bleeding                           K44.9, Diaphragmatic hernia without obstruction or                            gangrene                           K31.89, Other diseases of stomach and duodenum                           Z15.09, Genetic susceptibility to other malignant                            neoplasm                           R12, Heartburn CPT copyright 2022 American Medical Association. All rights reserved. The codes documented in this report are preliminary and upon coder review may  be revised to meet current compliance requirements. Justice Britain, MD 01/15/2022 8:36:34 AM Number of Addenda: 0

## 2022-01-15 NOTE — Transfer of Care (Signed)
Immediate Anesthesia Transfer of Care Note  Patient: Pamela Lowe  Procedure(s) Performed: ESOPHAGOGASTRODUODENOSCOPY (EGD) WITH PROPOFOL COLONOSCOPY WITH PROPOFOL BIOPSY POLYPECTOMY  Patient Location: PACU and Endoscopy Unit  Anesthesia Type:MAC  Level of Consciousness: drowsy and responds to stimulation  Airway & Oxygen Therapy: Patient Spontanous Breathing and Patient connected to face mask oxygen  Post-op Assessment: Report given to RN and Post -op Vital signs reviewed and stable  Post vital signs: Reviewed and stable  Last Vitals:  Vitals Value Taken Time  BP 88/58   Temp    Pulse 54 01/15/22 0828  Resp 17 01/15/22 0828  SpO2 100 % 01/15/22 0828  Vitals shown include unvalidated device data.  Last Pain:  Vitals:   01/15/22 0637  TempSrc: Tympanic         Complications: No notable events documented.

## 2022-01-15 NOTE — Discharge Instructions (Signed)
YOU HAD AN ENDOSCOPIC PROCEDURE TODAY: Refer to the procedure report and other information in the discharge instructions given to you for any specific questions about what was found during the examination. If this information does not answer your questions, please call [facility] office at [phone] to clarify.  ° °YOU SHOULD EXPECT: Some feelings of bloating in the abdomen. Passage of more gas than usual. Walking can help get rid of the air that was put into your GI tract during the procedure and reduce the bloating. If you had a lower endoscopy (such as a colonoscopy or flexible sigmoidoscopy) you may notice spotting of blood in your stool or on the toilet paper. Some abdominal soreness may be present for a day or two, also. ° °DIET: Your first meal following the procedure should be a light meal and then it is ok to progress to your normal diet. A half-sandwich or bowl of soup is an example of a good first meal. Heavy or fried foods are harder to digest and may make you feel nauseous or bloated. Drink plenty of fluids but you should avoid alcoholic beverages for 24 hours. If you had a esophageal dilation, please see attached instructions for diet.   ° °ACTIVITY: Your care partner should take you home directly after the procedure. You should plan to take it easy, moving slowly for the rest of the day. You can resume normal activity the day after the procedure however YOU SHOULD NOT DRIVE, use power tools, machinery or perform tasks that involve climbing or major physical exertion for 24 hours (because of the sedation medicines used during the test).  ° °SYMPTOMS TO REPORT IMMEDIATELY: °A gastroenterologist can be reached at any hour. Please call [phone]  for any of the following symptoms:  °Following lower endoscopy (colonoscopy, flexible sigmoidoscopy) °Excessive amounts of blood in the stool  °Significant tenderness, worsening of abdominal pains  °Swelling of the abdomen that is new, acute  °Fever of 100° or  higher  °Following upper endoscopy (EGD, EUS, ERCP, esophageal dilation) °Vomiting of blood or coffee ground material  °New, significant abdominal pain  °New, significant chest pain or pain under the shoulder blades  °Painful or persistently difficult swallowing  °New shortness of breath  °Black, tarry-looking or red, bloody stools ° °FOLLOW UP:  °If any biopsies were taken you will be contacted by phone or by letter within the next 1-3 weeks. Call [phone]  if you have not heard about the biopsies in 3 weeks.  °Please also call with any specific questions about appointments or follow up tests. ° °

## 2022-01-15 NOTE — Anesthesia Procedure Notes (Signed)
Procedure Name: MAC Date/Time: 01/15/2022 7:48 AM  Performed by: Eben Burow, CRNAPre-anesthesia Checklist: Patient identified, Emergency Drugs available, Suction available, Patient being monitored and Timeout performed Oxygen Delivery Method: Simple face mask Placement Confirmation: positive ETCO2

## 2022-01-15 NOTE — Op Note (Signed)
[facility] Patient Name: Pamela Lowe Procedure Date: 01/15/2022 MRN: [id number] Attending MD: Justice Britain , MD, [id number] Date of Birth: 04-Jan-1951 CSN: [id number] Age: 70 Admit Type: Outpatient Procedure:                Colonoscopy Indications:              High risk colon cancer surveillance: Personal                            history of hereditary nonpolyposis colorectal                            cancer (Lynch Syndrome), Incidental - Rectal                            bleeding Providers:                Justice Britain, MD, Doristine Johns, RN, Cletis Athens, Technician Referring MD:             [name], [place] Carlota Raspberry, MD, Milus Banister,                            MD Medicines:                Monitored Anesthesia Care Complications:            No immediate complications. Estimated Blood Loss:     Estimated blood loss was minimal. Procedure:                Pre-Anesthesia Assessment:                           - Prior to the procedure, a History and Physical                            was performed, and patient medications and                            allergies were reviewed. The patient's tolerance of                            previous anesthesia was also reviewed. The risks                            and benefits of the procedure and the sedation                            options and risks were discussed with the patient.                            All questions were answered, and informed consent                            was obtained. Prior Anticoagulants: The  patient has                            taken Plavix (clopidogrel), last dose was 5 days                            prior to procedure. ASA Grade Assessment: III - A                            patient with severe systemic disease. After                            reviewing the risks and benefits, the patient was                            deemed in  satisfactory condition to undergo the                            procedure.                           After obtaining informed consent, the colonoscope                            was passed under direct vision. Throughout the                            procedure, the patient's blood pressure, pulse, and                            oxygen saturations were monitored continuously. The                            [id number]-HQ190L ([id number] Olympus colonoscope was                            introduced through the anus and advanced to the 4                            cm into the ileum. The colonoscopy was performed                            without difficulty. The patient tolerated the                            procedure. The quality of the bowel preparation was                            good. The terminal ileum, ileocecal valve,                            appendiceal orifice, and rectum were photographed. Scope In: 8:07:30 AM Scope Out: 8:20:03 AM Total Procedure Duration: 0 hours 12 minutes 33 seconds  Findings:      The digital  rectal exam findings include hemorrhoids. Pertinent       negatives include no palpable rectal lesions.      The terminal ileum and ileocecal valve appeared normal.      A 4 mm polyp was found in the sigmoid colon. The polyp was sessile. The       polyp was removed with a cold snare. Resection and retrieval were       complete.      Multiple small-mouthed diverticula were found in the recto-sigmoid       colon, sigmoid colon and descending colon.      Normal mucosa was found in the entire colon otherwise.      Non-bleeding non-thrombosed external and internal hemorrhoids were found       during retroflexion, during perianal exam and during digital exam. The       hemorrhoids were Grade II (internal hemorrhoids that prolapse but reduce       spontaneously). Impression:               - Hemorrhoids found on digital rectal exam.                           - The examined  portion of the ileum was normal.                           - One 4 mm polyp in the sigmoid colon, removed with                            a cold snare. Resected and retrieved.                           - Diverticulosis in the recto-sigmoid colon, in the                            sigmoid colon and in the descending colon.                           - Normal mucosa in the entire examined colon                            otherwise.                           - Non-bleeding non-thrombosed external and internal                            hemorrhoids. Moderate Sedation:      Not Applicable - Patient had care per Anesthesia. Recommendation:           - The patient will be observed post-procedure,                            until all discharge criteria are met.                           - Discharge patient to home.                           -  Patient has a contact number available for                            emergencies. The signs and symptoms of potential                            delayed complications were discussed with the                            patient. Return to normal activities tomorrow.                            Written discharge instructions were provided to the                            patient.                           - High fiber diet.                           - Use FiberCon 1-2 tablets PO daily.                           - May restart Plavix on 10/31.                           - Continue present medications.                           - Await pathology results.                           - Repeat colonoscopy in 1 year for surveillance.                           - The findings and recommendations were discussed                            with the patient.                           - The findings and recommendations were discussed                            with the patient's family. Procedure Code(s):        --- Professional ---                           [phone],  Colonoscopy, flexible; with removal of                            tumor(s), polyp(s), or other lesion(s) by snare                            technique Diagnosis Code(s):        ---  Professional ---                           T70.017, Personal history of other malignant                            neoplasm of large intestine                           Z15.09, Genetic susceptibility to other malignant                            neoplasm                           K64.1, Second degree hemorrhoids                           D12.5, Benign neoplasm of sigmoid colon                           K57.30, Diverticulosis of large intestine without                            perforation or abscess without bleeding CPT copyright 2022 [employer]. All rights reserved. The codes documented in this report are preliminary and upon coder review may  be revised to meet current compliance requirements. Justice Britain, MD 01/15/2022 8:41:03 AM Number of Addenda: 0

## 2022-01-16 LAB — SURGICAL PATHOLOGY

## 2022-01-17 ENCOUNTER — Encounter (HOSPITAL_COMMUNITY): Payer: Self-pay | Admitting: Gastroenterology

## 2022-01-18 ENCOUNTER — Encounter: Payer: Self-pay | Admitting: Gastroenterology

## 2022-01-24 ENCOUNTER — Ambulatory Visit (INDEPENDENT_AMBULATORY_CARE_PROVIDER_SITE_OTHER): Payer: Medicare Other | Admitting: Bariatrics

## 2022-01-24 ENCOUNTER — Other Ambulatory Visit: Payer: Self-pay | Admitting: Bariatrics

## 2022-01-24 ENCOUNTER — Encounter: Payer: Self-pay | Admitting: Bariatrics

## 2022-01-24 VITALS — BP 146/72 | HR 64 | Temp 97.9°F | Ht 63.0 in | Wt 202.0 lb

## 2022-01-24 DIAGNOSIS — R7303 Prediabetes: Secondary | ICD-10-CM | POA: Diagnosis not present

## 2022-01-24 DIAGNOSIS — F5089 Other specified eating disorder: Secondary | ICD-10-CM

## 2022-01-24 DIAGNOSIS — Z6835 Body mass index (BMI) 35.0-35.9, adult: Secondary | ICD-10-CM

## 2022-01-24 DIAGNOSIS — E669 Obesity, unspecified: Secondary | ICD-10-CM

## 2022-01-25 ENCOUNTER — Other Ambulatory Visit: Payer: Self-pay | Admitting: Interventional Cardiology

## 2022-01-29 DIAGNOSIS — H4042X2 Glaucoma secondary to eye inflammation, left eye, moderate stage: Secondary | ICD-10-CM | POA: Diagnosis not present

## 2022-01-29 DIAGNOSIS — H4041X3 Glaucoma secondary to eye inflammation, right eye, severe stage: Secondary | ICD-10-CM | POA: Diagnosis not present

## 2022-01-29 DIAGNOSIS — H209 Unspecified iridocyclitis: Secondary | ICD-10-CM | POA: Diagnosis not present

## 2022-02-01 DIAGNOSIS — M059 Rheumatoid arthritis with rheumatoid factor, unspecified: Secondary | ICD-10-CM | POA: Diagnosis not present

## 2022-02-05 ENCOUNTER — Encounter: Payer: Self-pay | Admitting: Bariatrics

## 2022-02-05 NOTE — Progress Notes (Signed)
Chief Complaint:   OBESITY Pamela Lowe is here to discuss her progress with her obesity treatment plan along with follow-up of her obesity related diagnoses. Pamela Lowe is on the Category 1 Plan and states she is following her eating plan approximately 80% of the time. Pamela Lowe states she is walking and riding the stationary bike for 30-45 minutes 5-6 times per week.  Today's visit was #: 5 Starting weight: 213 lbs Starting date: 09/27/2021 Today's weight: 202 lbs Today's date: 01/24/2022 Total lbs lost to date: 11 Total lbs lost since last in-office visit: 3  Interim History: Pamela Lowe is down 3 lbs since her last visit.   Subjective:   1. Prediabetes Pamela Lowe is not on medications currently.   2. Other disorder of eating Pamela Lowe was prescribed Topamax, but she has glaucoma and she is at risk for issues.   Assessment/Plan:   1. Prediabetes [name] will consider metformin.   2. Other disorder of eating Pamela Lowe will not begin Topamax. She will consider metformin, and handout was given. She will increase her water and protein intake.   3. Obesity, current BMI 35.8 Pamela Lowe is currently in the action stage of change. As such, her goal is to continue with weight loss efforts. She has agreed to the Category 1 Plan.   She will choose better snacks, and increase her water and protein intake.   Exercise goals: As is.   Behavioral modification strategies: increasing lean protein intake, decreasing simple carbohydrates, increasing vegetables, increasing water intake, decreasing eating out, no skipping meals, meal planning and cooking strategies, keeping healthy foods in the home, and planning for success.  Pamela Lowe has agreed to follow-up with our clinic in 4 weeks. She was informed of the importance of frequent follow-up visits to maximize her success with intensive lifestyle modifications for her multiple health conditions.   Objective:   Blood pressure (!) 146/72, pulse 64, temperature 97.9 F  (36.6 C), height '5\' 3"'$  (1.6 m), last menstrual period 03/19/1998, SpO2 99 %. Body mass index is 35.96 kg/m.  General: Cooperative, alert, well developed, in no acute distress. HEENT: Conjunctivae and lids unremarkable. Cardiovascular: Regular rhythm.  Lungs: Normal work of breathing. Neurologic: No focal deficits.   Lab Results  Component Value Date   CREATININE 1.03 (H) 09/27/2021   BUN 13 09/27/2021   NA 140 09/27/2021   K 4.2 09/27/2021   CL 103 09/27/2021   CO2 24 09/27/2021   Lab Results  Component Value Date   ALT 33 (H) 09/27/2021   AST 36 09/27/2021   ALKPHOS 65 09/27/2021   BILITOT 0.8 09/27/2021   Lab Results  Component Value Date   HGBA1C 5.6 09/27/2021   HGBA1C 5.7 07/26/2021   HGBA1C 5.8 (A) 04/05/2021   Lab Results  Component Value Date   INSULIN 12.2 09/27/2021   Lab Results  Component Value Date   TSH 0.823 09/27/2021   Lab Results  Component Value Date   CHOL 148 09/27/2021   HDL 83 09/27/2021   LDLCALC 49 09/27/2021   TRIG 85 09/27/2021   CHOLHDL 1.9 05/29/2021   Lab Results  Component Value Date   VD25OH 30.3 09/27/2021   VD25OH 40.6 08/10/2021   VD25OH 41.0 09/21/2016   Lab Results  Component Value Date   WBC 7.1 12/18/2021   HGB 13.9 12/18/2021   HCT 41.8 12/18/2021   MCV 92.3 12/18/2021   PLT 302 12/18/2021   Lab Results  Component Value Date   FERRITIN 28 12/18/2021   Attestation Statements:  Reviewed by clinician on day of visit: allergies, medications, problem list, medical history, surgical history, family history, social history, and previous encounter notes.   Wilhemena Durie, am acting as Location manager for [employer], DO.  I have reviewed the above documentation for accuracy and completeness, and I agree with the above. Jearld Lesch, DO

## 2022-02-17 ENCOUNTER — Other Ambulatory Visit: Payer: Self-pay | Admitting: Interventional Cardiology

## 2022-02-20 ENCOUNTER — Other Ambulatory Visit: Payer: Self-pay

## 2022-02-20 MED ORDER — HYDROCHLOROTHIAZIDE 12.5 MG PO CAPS: ORAL_CAPSULE | ORAL | 0 refills | Status: DC

## 2022-02-26 ENCOUNTER — Ambulatory Visit: Payer: Medicare Other | Admitting: Bariatrics

## 2022-03-21 DIAGNOSIS — J339 Nasal polyp, unspecified: Secondary | ICD-10-CM | POA: Diagnosis not present

## 2022-03-21 DIAGNOSIS — D329 Benign neoplasm of meninges, unspecified: Secondary | ICD-10-CM | POA: Diagnosis not present

## 2022-03-21 DIAGNOSIS — J324 Chronic pansinusitis: Secondary | ICD-10-CM | POA: Diagnosis not present

## 2022-03-21 DIAGNOSIS — J3489 Other specified disorders of nose and nasal sinuses: Secondary | ICD-10-CM | POA: Diagnosis not present

## 2022-03-22 ENCOUNTER — Encounter (INDEPENDENT_AMBULATORY_CARE_PROVIDER_SITE_OTHER): Payer: Self-pay | Admitting: Family Medicine

## 2022-03-22 ENCOUNTER — Ambulatory Visit (INDEPENDENT_AMBULATORY_CARE_PROVIDER_SITE_OTHER): Payer: Medicare Other | Admitting: Family Medicine

## 2022-03-22 VITALS — BP 132/81 | HR 75 | Temp 97.9°F | Ht 63.0 in | Wt 201.0 lb

## 2022-03-22 DIAGNOSIS — E559 Vitamin D deficiency, unspecified: Secondary | ICD-10-CM | POA: Diagnosis not present

## 2022-03-22 DIAGNOSIS — E669 Obesity, unspecified: Secondary | ICD-10-CM | POA: Diagnosis not present

## 2022-03-22 DIAGNOSIS — Z6835 Body mass index (BMI) 35.0-35.9, adult: Secondary | ICD-10-CM

## 2022-03-22 DIAGNOSIS — D329 Benign neoplasm of meninges, unspecified: Secondary | ICD-10-CM | POA: Diagnosis not present

## 2022-03-22 DIAGNOSIS — R632 Polyphagia: Secondary | ICD-10-CM | POA: Diagnosis not present

## 2022-03-22 DIAGNOSIS — M170 Bilateral primary osteoarthritis of knee: Secondary | ICD-10-CM | POA: Diagnosis not present

## 2022-03-23 DIAGNOSIS — Z79899 Other long term (current) drug therapy: Secondary | ICD-10-CM | POA: Diagnosis not present

## 2022-03-26 DIAGNOSIS — L658 Other specified nonscarring hair loss: Secondary | ICD-10-CM | POA: Diagnosis not present

## 2022-03-27 ENCOUNTER — Ambulatory Visit (INDEPENDENT_AMBULATORY_CARE_PROVIDER_SITE_OTHER): Payer: Medicare Other | Admitting: Gastroenterology

## 2022-03-27 ENCOUNTER — Other Ambulatory Visit (INDEPENDENT_AMBULATORY_CARE_PROVIDER_SITE_OTHER): Payer: Medicare Other

## 2022-03-27 VITALS — BP 124/80 | HR 87 | Ht 66.0 in | Wt 207.0 lb

## 2022-03-27 DIAGNOSIS — Z1509 Genetic susceptibility to other malignant neoplasm: Secondary | ICD-10-CM | POA: Diagnosis not present

## 2022-03-27 DIAGNOSIS — R12 Heartburn: Secondary | ICD-10-CM | POA: Diagnosis not present

## 2022-03-27 DIAGNOSIS — R1013 Epigastric pain: Secondary | ICD-10-CM

## 2022-03-27 DIAGNOSIS — R140 Abdominal distension (gaseous): Secondary | ICD-10-CM

## 2022-03-27 LAB — CBC
HCT: 43.2 % (ref 36.0–46.0)
Hemoglobin: 14.2 g/dL (ref 12.0–15.0)
MCHC: 33 g/dL (ref 30.0–36.0)
MCV: 92.6 fl (ref 78.0–100.0)
Platelets: 319 10*3/uL (ref 150.0–400.0)
RBC: 4.67 Mil/uL (ref 3.87–5.11)
RDW: 14.4 % (ref 11.5–15.5)
WBC: 9.8 10*3/uL (ref 4.0–10.5)

## 2022-03-27 LAB — COMPREHENSIVE METABOLIC PANEL
ALT: 33 U/L (ref 0–35)
AST: 34 U/L (ref 0–37)
Albumin: 4.2 g/dL (ref 3.5–5.2)
Alkaline Phosphatase: 56 U/L (ref 39–117)
BUN: 24 mg/dL — ABNORMAL HIGH (ref 6–23)
CO2: 27 mEq/L (ref 19–32)
Calcium: 10 mg/dL (ref 8.4–10.5)
Chloride: 105 mEq/L (ref 96–112)
Creatinine, Ser: 1.04 mg/dL (ref 0.40–1.20)
GFR: 54.36 mL/min — ABNORMAL LOW (ref >60.00)
Glucose, Bld: 116 mg/dL — ABNORMAL HIGH (ref 70–99)
Potassium: 4.1 mEq/L (ref 3.5–5.1)
Sodium: 141 mEq/L (ref 135–145)
Total Bilirubin: 0.7 mg/dL (ref 0.2–1.2)
Total Protein: 7.3 g/dL (ref 6.0–8.3)

## 2022-03-27 LAB — AMYLASE: Amylase: 39 U/L (ref 27–131)

## 2022-03-27 LAB — LIPASE: Lipase: 19 U/L (ref 11.0–59.0)

## 2022-03-27 NOTE — Progress Notes (Signed)
[place] VISIT   Primary Care Provider Wendie Agreste, MD [address] [place] [address] [phone]  Patient Profile: Pamela Lowe is a 72 y.o. female with a pmh significant for Lynch syndrome, previous brain tumor excision, PAD & Clotting disorder (on Plavix, s/p hysterectomy.  The patient presents to the [facility] for an evaluation and management of problem(s) noted below:  Problem List 1. Lynch syndrome   2. Pyrosis   3. Bloating   4. Abdominal pain, epigastric     History of Present Illness Please see prior notes for full details of HPI.  Interval History She is a Dr. Eugenia Pancoast patient that I met for recent EGD/Colonoscopy.  The patient is describing a burning sensation in the midepigastrium that occurs at different times of the day.  She had been on PPI therapy as well as Pepcid.  She is experiencing abdominal cramping pains that occur a few times a year and abate with hyoscyamine therapy (2-4 times per year).  She is experience gas and bloating as well at times.  Her bowels are unchanged at this time.  GI Review of Systems Positive as above Negative for dysphagia, odynophagia, melena, hematochezia, change in bowel habits  Review of Systems General: Denies fevers/chills/weight loss unintentionally Cardiovascular: Denies chest pain Pulmonary: Denies shortness of breath Gastroenterological: See HPI Genitourinary: Denies darkened urine  Hematological: Denies easy bruising/bleeding Dermatological: Denies juandice Psychological: Mood is stable   Medications Current Outpatient Medications  Medication Sig Dispense Refill   AMBULATORY NON FORMULARY MEDICATION Apply 1 drop topically at bedtime. Terbinafine DMSO 3.34% susp     Ascorbic Acid (VITAMIN C) 500 MG CAPS Take 1 tablet by mouth daily.     aspirin 81 MG EC tablet Take 81 mg by mouth every evening.      B Complex Vitamins (VITAMIN B COMPLEX) TABS Take  1 tablet by mouth daily.     benazepril (LOTENSIN) 20 MG tablet TAKE 1 TABLET BY MOUTH DAILY 90 tablet 2   brimonidine (ALPHAGAN) 0.2 % ophthalmic solution Place 1 drop into both eyes 3 (three) times daily.      clopidogrel (PLAVIX) 75 MG tablet Take 1 tablet (75 mg total) by mouth daily. Please call to schedule an overdue appointment with Dr. Tamala Julian for refills, [phone], thank you. 1st attempt. 90 tablet 1   dorzolamide-timolol (COSOPT) 22.3-6.8 MG/ML ophthalmic solution Place 1 drop into the right eye 2 (two) times daily.     ezetimibe (ZETIA) 10 MG tablet TAKE 1 TABLET BY MOUTH DAILY 90 tablet 3   fexofenadine (ALLEGRA) 60 MG tablet Take 60 mg by mouth as needed for allergies or rhinitis.     folic acid (FOLVITE) 1 MG tablet Take 1 mg by mouth daily.      hydrochlorothiazide (MICROZIDE) 12.5 MG capsule TAKE 1 CAPSULE EVERY MON-WED-FRI Please call to schedule an overdue appointment with Dr. Tamala Julian for refills, [phone], thank you. 1st attempt. 36 capsule 0   hydrocortisone (CORTEF) 10 MG tablet Take 10 mg by mouth daily.     hyoscyamine (LEVSIN SL) 0.125 MG SL tablet DISSOLVE 1 TO 2 TABLETS IN MOUTH UNDER THE TONGUE TWICE DAILY AS NEEDED 50 tablet 1   methotrexate (RHEUMATREX) 2.5 MG tablet Take by mouth once a week. Mondays Take 6 tablets     metoprolol succinate (TOPROL-XL) 25 MG 24 hr tablet Take 1 tablet (25 mg total) by mouth daily. 90 tablet 1   Multiple Vitamins-Minerals (MULTIVITAMIN WITH MINERALS)  tablet Take 1 tablet by mouth daily.     Omega-3 Fatty Acids (FISH OIL) 1000 MG CAPS Take 1 capsule by mouth daily.      pantoprazole (PROTONIX) 40 MG tablet Take 1 tablet (40 mg total) by mouth daily. 90 tablet 3   rosuvastatin (CRESTOR) 20 MG tablet TAKE 1 TABLET BY MOUTH DAILY 90 tablet 3   SIMPONI ARIA 50 MG/4ML SOLN injection Inject 50 mg into the vein. Every 60 Days     topiramate (TOPAMAX) 50 MG tablet      No current facility-administered medications for this visit.     Allergies Allergies  Allergen Reactions   Sulfamethoxazole-Trimethoprim Other (See Comments) and Rash    Chills/achy Flu like symptoms    Conjugated Estrogens     Other reaction(s): rash   Wellbutrin [Bupropion Hcl] Rash    rash    Histories Past Medical History:  Diagnosis Date   Allergic rhinitis    Allergy    Arthritis    Brain tumor ([place])    Breast cyst 2007   Right   Cataract    surgery 07/2017   Clotting disorder ([place])    On plavix since stents  for artery blockages   Depression 2006   w/ menopause   Depression screening 09/27/2021   Family history of adverse reaction to anesthesia    sister PONV   Family history of breast cancer    Family history of colon cancer    Family history of kidney cancer 01/15/2018   Family history of lung cancer    Family history of pancreatic cancer    Family history of prostate cancer    Family history of uterine cancer    GERD (gastroesophageal reflux disease)    Glaucoma    Heart murmur    History of hysterectomy, supracervical    Hyperlipidemia    Hypertension    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   MLH1 [id number] (Splice site)   Multinodular goiter    PAD (peripheral artery disease) ([place])    LE Art [place] 1/18: Occluded prox-mid R SFA // ABIs 9/22: R 0.83; L 1.0   Pustular psoriasis    Tobacco user    Past Surgical History:  Procedure Laterality Date   ABDOMINAL HYSTERECTOMY  1995   BIOPSY  01/15/2022   Procedure: BIOPSY;  Surgeon: Irving Copas., MD;  Location: [facility];  Service: Gastroenterology;;   BRAIN TUMOR EXCISION  02/24/2021   at [place]     CATARACT EXTRACTION Right 07/2017   COLONOSCOPY     COLONOSCOPY WITH PROPOFOL N/A 01/15/2022   Procedure: COLONOSCOPY WITH PROPOFOL;  Surgeon: Irving Copas., MD;  Location: [facility];  Service: Gastroenterology;  Laterality: N/A;   CORONARY STENT INTERVENTION N/A 12/26/2018    Procedure: CORONARY STENT INTERVENTION;  Surgeon: Belva Crome, MD;  Location: [facility];  Service: Cardiovascular;  Laterality: N/A;   ESOPHAGOGASTRODUODENOSCOPY (EGD) WITH PROPOFOL N/A 01/15/2022   Procedure: ESOPHAGOGASTRODUODENOSCOPY (EGD) WITH PROPOFOL;  Surgeon: Rush Landmark Telford Nab., MD;  Location: [facility];  Service: Gastroenterology;  Laterality: N/A;   EYE SURGERY  2019, 2020   Cataracts, eye shunt implants   LEFT HEART CATH AND CORONARY ANGIOGRAPHY N/A 12/17/2018   Procedure: LEFT HEART CATH AND CORONARY ANGIOGRAPHY;  Surgeon: Belva Crome, MD;  Location: [facility];  Service: Cardiovascular;  Laterality: N/A;   POLYPECTOMY  01/15/2022   Procedure: POLYPECTOMY;  Surgeon: Irving Copas.,  MD;  Location: [facility];  Service: Gastroenterology;;   SUPRACERVICAL ABDOMINAL HYSTERECTOMY  1986   h/o supracervical hysterectomy   UPPER GASTROINTESTINAL ENDOSCOPY     Social History   Socioeconomic History   Marital status: Married    Spouse name: [name]   Number of children: 0   Years of education: Not on file   Highest education level: Master's degree (e.g., MA, MS, MEng, MEd, MSW, MBA)  Occupational History   Occupation: Special Ed Pharmacist, hospital    Comment: Engineer, manufacturing  Tobacco Use   Smoking status: Former    Packs/day: 0.25    Years: 36.00    Total pack years: 9.00    Types: Cigarettes    Quit date: 08/01/2017    Years since quitting: 4.6   Smokeless tobacco: Never   Tobacco comments:    Started smoking at about 72 yrs old  Vaping Use   Vaping Use: Never used  Substance and Sexual Activity   Alcohol use: Not Currently   Drug use: No   Sexual activity: Not Currently    Partners: Male    Birth control/protection: Post-menopausal, Surgical    Comment: Hysterectomy  Other Topics Concern   Not on file  Social History Narrative   Not on file   Social Determinants of Health   Financial Resource Strain: Low Risk  (08/03/2021)   Overall  Financial Resource Strain (CARDIA)    Difficulty of Paying Living Expenses: Not hard at all  Food Insecurity: No Food Insecurity (08/03/2021)   Hunger Vital Sign    Worried About Running Out of Food in the Last Year: Never true    [place] in the Last Year: Never true  Transportation Needs: No Transportation Needs (08/03/2021)   PRAPARE - Hydrologist (Medical): No    Lack of Transportation (Non-Medical): No  Physical Activity: Sufficiently Active (08/03/2021)   Exercise Vital Sign    Days of Exercise per Week: 6 days    Minutes of Exercise per Session: 30 min  Stress: No Stress Concern Present (08/03/2021)   [place]    Feeling of Stress : Not at all  Social Connections: Moderately Integrated (08/03/2021)   Social Connection and Isolation Panel [NHANES]    Frequency of Communication with Friends and Family: More than three times a week    Frequency of Social Gatherings with Friends and Family: More than three times a week    Attends Religious Services: More than 4 times per year    Active Member of [employer] or Organizations: No    Attends Archivist Meetings: Never    Marital Status: Married  Human resources officer Violence: Not At Risk (08/03/2021)   Humiliation, Afraid, Rape, and Kick questionnaire    Fear of Current or Ex-Partner: No    Emotionally Abused: No    Physically Abused: No    Sexually Abused: No   Family History  Problem Relation Age of Onset   Hypertension Mother    Deep vein thrombosis Mother    Alzheimer's disease Mother    Osteopenia Mother    Arthritis Mother    Varicose Veins Mother    Kidney cancer Father 29   Pneumonia Father    Hypertension Father    Arthritis Father    Early death Father    Kidney disease Father    Breast cancer Sister 84       recurrence 85   Cancer  Sister    Breast cancer Sister 60       Stage 0   Cancer Sister    Breast  cancer Sister 7       Stage 3, bilateral mastectomy   Cancer Sister    Early death Sister    Deep vein thrombosis Sister            Allergies Brother    Early death Brother    Heart attack Brother 15   Breast cancer Maternal Aunt 67   Prostate cancer Maternal Uncle 68   Lung cancer Maternal Uncle 60   Pancreatic cancer Paternal Aunt 64   Pancreatic cancer Paternal Aunt 7   Brain cancer Paternal Aunt    Pancreatic cancer Paternal Uncle 68   Lung cancer Paternal Uncle 10   Heart attack Maternal Grandfather    Uterine cancer Paternal Grandmother 57   Colon cancer Paternal Grandfather 55   Kidney cancer Cousin 58   Kidney cancer Cousin 52   Uterine cancer Cousin 62   Prostate cancer Cousin 57   Breast cancer Cousin 60   Thyroid disease Neg Hx    Rectal cancer Neg Hx    Stomach cancer Neg Hx    Esophageal cancer Neg Hx    Inflammatory bowel disease Neg Hx    Liver disease Neg Hx    I have reviewed her medical, social, and family history in detail and updated the electronic medical record as necessary.    PHYSICAL EXAMINATION  BP 124/80   Pulse 87   Ht '5\' 6"'$  (1.676 m)   Wt 207 lb (93.9 kg)   LMP 03/19/1998 Comment: h/o supracervical hysterectomy  BMI 33.41 kg/m  Wt Readings from Last 3 Encounters:  03/27/22 207 lb (93.9 kg)  03/22/22 201 lb (91.2 kg)  01/24/22 202 lb (91.6 kg)  GEN: NAD, appears stated age, doesn't appear chronically ill PSYCH: Cooperative, without pressured speech EYE: Conjunctivae pink, sclerae anicteric ENT: MMM CV: Nontachycardic RESP: No wheezing appreciated GI: NABS, soft, NT/ND, without rebound or guarding MSK/EXT: No LE edema SKIN: No jaundice NEURO:  Alert & Oriented x 3, no focal deficits   REVIEW OF DATA  I reviewed the following data at the time of this encounter:  GI Procedures and Studies  October 2023 EGD - No gross lesions in the proximal esophagus and in the mid esophagus. LA Grade A esophagitis with no bleeding found in  the very distal esophagus/GE junction. - Z-line regular, 40 cm from the incisors. - 2 cm hiatal hernia. - Erythematous mucosa in the antrum. No other gross lesions in the entire stomach. Biopsied. - No gross lesions in the duodenal bulb, in the first portion of the duodenum and in the second portion of the duodenum.  October 2023 Colonoscopy - Hemorrhoids found on digital rectal exam. - The examined portion of the ileum was normal. - One 4 mm polyp in the sigmoid colon, removed with a cold snare. Resected and retrieved. - Diverticulosis in the recto-sigmoid colon, in the sigmoid colon and in the descending colon. - Normal mucosa in the entire examined colon otherwise. - Non-bleeding non-thrombosed external and internal hemorrhoids.  Pathology FINAL MICROSCOPIC DIAGNOSIS:  A. STOMACH, BIOPSY:  Fragments of gastric mucosa with minimal vascular ectasia.  H. pylori, intestinal metaplasia, atrophy and dysplasia are not  identified.  B. COLON, SIGMOID, POLYPECTOMY:  Hyperplastic polyp.  Negative for dysplasia.   Laboratory Studies  Reviewed those in [facility]  Imaging Studies  No relevant studies  ASSESSMENT  Ms. Philippi is a 72 y.o. female with a pmh significant for Lynch syndrome, previous brain tumor excision, PAD & Clotting disorder (on Plavix, s/p hysterectomy.  The patient is seen today for evaluation and management of:  1. Lynch syndrome   2. Pyrosis   3. Bloating   4. Abdominal pain, epigastric    The patient is hemodynamically stable.  Clinically she is relatively stable.  She is having further issues at this time, and I think if we can optimize her bowels and in the setting of her hiatal hernia try to optimize her acid therapy she may have some improvement.  I think as she has not had any updated imaging in quite awhile, we will begin by performing an abdominal ultrasound.  We recommend fiber supplementation.  She may trial Gaviscon in the setting of her symptoms in case there  is any chance this is hiatal hernia related.  She will continue her yearly Colonoscopies and her EGDs every 2-4 years.  All patient questions were answered to the best of my ability, and the patient agrees to the aforementioned plan of action with follow-up as indicated.   PLAN  Laboratories as outlined below Abdominal U/S to be performed Continue PPI daily (take 30 minutes before a meal) Trial Gaviscon for next 4-weeks as needed to see if that will be helpful Increase Fiber intake Start Metamucil or Benefiber or Fibercon If issues persist then consider SIBO breath testing If issues persist then we will need to consider CTAP cross sectional imaging Colonoscopy yearly EGD every 2-4 years   Orders Placed This Encounter  Procedures   US Abdomen Complete   CBC   Comp Met (CMET)   Amylase   Lipase    New Prescriptions   No medications on file   Modified Medications   No medications on file    Planned Follow Up No follow-ups on file.   Total Time in Face-to-Face and in Coordination of Care for patient including independent/personal interpretation/review of prior testing, medical history, examination, medication adjustment, communicating results with the patient directly, and documentation within the EHR is 25 minutes.   Justice Britain, MD [facility] Office # [phone]

## 2022-03-27 NOTE — Patient Instructions (Signed)
Your provider has requested that you go to the basement level for lab work before leaving today. Press "B" on the elevator. The lab is located at the first door on the left as you exit the elevator.  You have been scheduled for an abdominal ultrasound at [facility] (1st floor of hospital) on 03/29/22 at 10:30 am . Please arrive 15  minutes prior to your appointment for registration. Make certain not to have anything to eat or drink 6-8 hours prior to your appointment. Should you need to reschedule your appointment, please contact radiology at [phone]. This test typically takes about 30 minutes to perform.  You may trial Metamucil OR Benefiber -A high fiber diet with plenty of fluids (up to 8 glasses of water daily) is suggested to relieve these symptoms.  Metamucil or Benefiber , 1 tablespoon once or twice daily can be used to keep bowels regular if needed.  You may also trial Gaviscon as directed for 2-3 weeks.   Continue Pantoprazole.    Due to recent changes in healthcare laws, you may see the results of your imaging and laboratory studies on MyChart before your provider has had a chance to review them.  We understand that in some cases there may be results that are confusing or concerning to you. Not all laboratory results come back in the same time frame and the provider may be waiting for multiple results in order to interpret others.  Please give [place] 48 hours in order for your provider to thoroughly review all the results before contacting the office for clarification of your results.   Thank you for choosing me and [facility].  Dr. Rush Landmark

## 2022-03-28 ENCOUNTER — Encounter: Payer: Self-pay | Admitting: Gastroenterology

## 2022-03-29 ENCOUNTER — Ambulatory Visit (HOSPITAL_COMMUNITY)
Admission: RE | Admit: 2022-03-29 | Discharge: 2022-03-29 | Disposition: A | Discharge status: Home / Self Care | Payer: Medicare Other | Source: Ambulatory Visit | Attending: Gastroenterology | Admitting: Gastroenterology

## 2022-03-29 DIAGNOSIS — R12 Heartburn: Secondary | ICD-10-CM

## 2022-03-29 DIAGNOSIS — R1013 Epigastric pain: Secondary | ICD-10-CM

## 2022-03-29 DIAGNOSIS — Z1509 Genetic susceptibility to other malignant neoplasm: Secondary | ICD-10-CM

## 2022-03-29 DIAGNOSIS — R140 Abdominal distension (gaseous): Secondary | ICD-10-CM | POA: Diagnosis not present

## 2022-03-30 DIAGNOSIS — R140 Abdominal distension (gaseous): Secondary | ICD-10-CM | POA: Insufficient documentation

## 2022-03-30 DIAGNOSIS — R12 Heartburn: Secondary | ICD-10-CM | POA: Insufficient documentation

## 2022-03-30 DIAGNOSIS — R1013 Epigastric pain: Secondary | ICD-10-CM | POA: Insufficient documentation

## 2022-04-04 DIAGNOSIS — L4050 Arthropathic psoriasis, unspecified: Secondary | ICD-10-CM | POA: Diagnosis not present

## 2022-04-04 DIAGNOSIS — M25569 Pain in unspecified knee: Secondary | ICD-10-CM | POA: Diagnosis not present

## 2022-04-04 DIAGNOSIS — M1990 Unspecified osteoarthritis, unspecified site: Secondary | ICD-10-CM | POA: Diagnosis not present

## 2022-04-04 DIAGNOSIS — Z79899 Other long term (current) drug therapy: Secondary | ICD-10-CM | POA: Diagnosis not present

## 2022-04-04 DIAGNOSIS — M5137 Other intervertebral disc degeneration, lumbosacral region: Secondary | ICD-10-CM | POA: Diagnosis not present

## 2022-04-04 DIAGNOSIS — E669 Obesity, unspecified: Secondary | ICD-10-CM | POA: Diagnosis not present

## 2022-04-04 DIAGNOSIS — M47819 Spondylosis without myelopathy or radiculopathy, site unspecified: Secondary | ICD-10-CM | POA: Diagnosis not present

## 2022-04-09 ENCOUNTER — Ambulatory Visit (INDEPENDENT_AMBULATORY_CARE_PROVIDER_SITE_OTHER): Payer: Medicare Other | Admitting: Family Medicine

## 2022-04-09 ENCOUNTER — Encounter: Payer: Self-pay | Admitting: Family Medicine

## 2022-04-09 VITALS — BP 110/66 | HR 78 | Temp 98.1°F | Ht 66.0 in | Wt 201.8 lb

## 2022-04-09 DIAGNOSIS — R7303 Prediabetes: Secondary | ICD-10-CM | POA: Diagnosis not present

## 2022-04-09 DIAGNOSIS — R944 Abnormal results of kidney function studies: Secondary | ICD-10-CM

## 2022-04-09 DIAGNOSIS — R9341 Abnormal radiologic findings on diagnostic imaging of renal pelvis, ureter, or bladder: Secondary | ICD-10-CM

## 2022-04-09 DIAGNOSIS — I10 Essential (primary) hypertension: Secondary | ICD-10-CM

## 2022-04-09 DIAGNOSIS — E782 Mixed hyperlipidemia: Secondary | ICD-10-CM | POA: Diagnosis not present

## 2022-04-09 LAB — LIPID PANEL
Cholesterol: 143 mg/dL (ref 0–200)
HDL: 69.9 mg/dL (ref >39.00)
LDL Cholesterol: 56 mg/dL (ref 0–99)
NonHDL: 73.54
Total CHOL/HDL Ratio: 2
Triglycerides: 87 mg/dL (ref 0.0–149.0)
VLDL: 17.4 mg/dL (ref 0.0–40.0)

## 2022-04-09 LAB — COMPREHENSIVE METABOLIC PANEL
ALT: 31 U/L (ref 0–35)
AST: 36 U/L (ref 0–37)
Albumin: 4 g/dL (ref 3.5–5.2)
Alkaline Phosphatase: 55 U/L (ref 39–117)
BUN: 24 mg/dL — ABNORMAL HIGH (ref 6–23)
CO2: 26 mEq/L (ref 19–32)
Calcium: 10 mg/dL (ref 8.4–10.5)
Chloride: 106 mEq/L (ref 96–112)
Creatinine, Ser: 1.12 mg/dL (ref 0.40–1.20)
GFR: 49.72 mL/min — ABNORMAL LOW (ref >60.00)
Glucose, Bld: 104 mg/dL — ABNORMAL HIGH (ref 70–99)
Potassium: 3.7 mEq/L (ref 3.5–5.1)
Sodium: 139 mEq/L (ref 135–145)
Total Bilirubin: 0.9 mg/dL (ref 0.2–1.2)
Total Protein: 7.1 g/dL (ref 6.0–8.3)

## 2022-04-09 LAB — HEMOGLOBIN A1C: Hgb A1c MFr Bld: 5.9 % (ref 4.6–6.5)

## 2022-04-09 NOTE — Progress Notes (Signed)
Subjective:  Patient ID: Pamela Lowe, female    DOB: 10/23/1950  Age: 72 y.o. MRN: [id number]  CC:  Chief Complaint  Patient presents with   Bloated    Pt notes abdominal pain, saw GI for sonogram and they wanted PCP to check kidney function and concerns, otherwise is following up with them in 3 weeks    Prediabetes    labs   Hyperlipidemia    Labs    Hypertension    BP normal in office     HPI Pamela Lowe presents for   Abdominal pain/bloating Recently seen by GI, Dr. Rush Landmark in January 9.  Continued on PPI daily, Gaviscon counter, increase fiber intake and fiber supplement.  Option of SIBO breath testing, option of CT.  Ultrasound was ordered with renal questions as below. Bloating/sx's  improving with pepcid and tx above. Not using gaviscon - plans to discuss gaviscon with her GI.    Renal abnormality Abdominal ultrasound for abdominal discomfort as above ordered by gastroenterology.  Appeared normal except for some changes noted on the right kidney.  Mention of possible slight increased echogenicity of the right renal cortex, and to correlate for medical renal disease.  Renal function has been overall stable with most recent creatinine 1.04, EGFR 54.36.Similar to prior labs. she does have a history of hypertension.  No current nephrologist.  No nsaids  Hypertension: Treated with Toprol-XL 25 mg daily, benazepril 10 mg daily, hydrochlorothiazide 12.5 mg daily 3 days/week.  She also takes aspirin, Plavix with history of CAD, PAD, coronary artery stent in 2020. No new bleeding.   Followed by rheumatology for rheumatoid arthritis, on hydrocortisone for adrenal insufficiency status post meningioma removal.  Followed by endocrinology with history of goiter. Home readings: low at times to 98  BP Readings from Last 3 Encounters:  04/09/22 110/66  03/27/22 124/80  03/22/22 132/81   Lab Results  Component Value Date   CREATININE 1.04 03/27/2022    Prediabetes: Followed by healthy weight and wellness.on topamax, follow u pin next week.   Lab Results  Component Value Date   HGBA1C 5.6 09/27/2021   Wt Readings from Last 3 Encounters:  04/09/22 201 lb 12.8 oz (91.5 kg)  03/27/22 207 lb (93.9 kg)  03/22/22 201 lb (91.2 kg)   Hyperlipidemia: Crestor 20 mg daily, Zetia 10 mg daily.  Lab Results  Component Value Date   CHOL 148 09/27/2021   HDL 83 09/27/2021   LDLCALC 49 09/27/2021   TRIG 85 09/27/2021   CHOLHDL 1.9 05/29/2021   Lab Results  Component Value Date   ALT 33 03/27/2022   AST 34 03/27/2022   ALKPHOS 56 03/27/2022   BILITOT 0.7 03/27/2022      History Patient Active Problem List   Diagnosis Date Noted   Pyrosis 03/30/2022   Bloating 03/30/2022   Abdominal pain, epigastric 03/30/2022   Polyphagia 03/22/2022   Eating disorder 12/27/2021   Prediabetes 11/16/2021   Osteoarthritis 09/28/2021   Degeneration of lumbosacral intervertebral disc 09/28/2021   Elevated ALT measurement 09/28/2021   Other fatigue 09/27/2021   SOB (shortness of breath) on exertion 09/27/2021   Elevated glucose 09/27/2021   Health care maintenance 09/27/2021   Vitamin D deficiency 09/27/2021   Depression screening 09/27/2021   Class 1 obesity with serious comorbidity and body mass index (BMI) of 34.0 to 34.9 in adult 09/27/2021   Spondylosis without myelopathy 08/03/2021   Rheumatoid arthritis with rheumatoid factor ([place]) 08/03/2021   Psoriasis 08/03/2021  Hypopituitarism ([place]) 08/03/2021   Adrenal insufficiency ([place]) 08/03/2021   Gastroesophageal reflux disease 07/26/2021   Meningioma ([place]) 03/21/2021   Visual field defect 03/21/2021   Chronic left shoulder pain 03/21/2021   Cervical radiculopathy 03/21/2021   History of pituitary surgery 03/06/2021   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   Pseudophakia of both eyes 06/29/2019   Steroid responders to glaucoma of both eyes 06/29/2019   S/P coronary artery stent placement     PAD (peripheral artery disease) (HCC)    CAD (coronary artery disease) 12/26/2018   Angina pectoris ([place])    Primary open-angle glaucoma 10/28/2018   Glaucoma associated with ocular inflammation 10/28/2018   Uveitic glaucoma of right eye, severe stage 10/28/2018   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 ([place]) 08/21/2018   Aortic atherosclerosis ([place]) 08/19/2018   Uveitis 05/05/2018   Goiter 05/05/2018   Mixed hyperlipidemia 05/05/2018   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   Genetic testing 02/07/2018   Family history of kidney cancer 01/15/2018   Family history of pancreatic cancer    Family history of breast cancer    Family history of uterine cancer    Family history of colon cancer    Family history of prostate cancer    Family history of lung cancer    Hypertensive retinopathy of both eyes 11/12/2017   Iritis of right eye 11/12/2017   Nuclear sclerotic cataract of left eye 11/12/2017   Ocular hypertension of right eye 11/12/2017   Inflammation of eye, right 08/07/2017   Memory loss 09/21/2016   Essential hypertension 09/21/2016   Low vitamin D level 09/21/2016   DOE (dyspnea on exertion) 11/08/2010   Past Medical History:  Diagnosis Date   Allergic rhinitis    Allergy    Arthritis    Brain tumor ([place])    Breast cyst 2007   Right   Cataract    surgery 07/2017   Clotting disorder ([place])    On plavix since stents  for artery blockages   Depression 2006   w/ menopause   Depression screening 09/27/2021   Family history of adverse reaction to anesthesia    sister PONV   Family history of breast cancer    Family history of colon cancer    Family history of kidney cancer 01/15/2018   Family history of lung cancer    Family history of pancreatic cancer    Family history of prostate cancer    Family history of uterine cancer    GERD (gastroesophageal reflux disease)    Glaucoma    Heart murmur    History of hysterectomy,  supracervical    Hyperlipidemia    Hypertension    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   MLH1 [id number] (Splice site)   Multinodular goiter    PAD (peripheral artery disease) ([place])    LE Art [place] 1/18: Occluded prox-mid R SFA // ABIs 9/22: R 0.83; L 1.0   Pustular psoriasis    Tobacco user    Past Surgical History:  Procedure Laterality Date   ABDOMINAL HYSTERECTOMY  1995   BIOPSY  01/15/2022   Procedure: BIOPSY;  Surgeon: Irving Copas., MD;  Location: [facility];  Service: Gastroenterology;;   BRAIN TUMOR EXCISION  02/24/2021   at [facility]     CATARACT EXTRACTION Right 07/2017   COLONOSCOPY     COLONOSCOPY WITH PROPOFOL N/A 01/15/2022   Procedure: COLONOSCOPY WITH PROPOFOL;  Surgeon: Irving Copas.,  MD;  Location: [facility];  Service: Gastroenterology;  Laterality: N/A;   CORONARY STENT INTERVENTION N/A 12/26/2018   Procedure: CORONARY STENT INTERVENTION;  Surgeon: Belva Crome, MD;  Location: [facility];  Service: Cardiovascular;  Laterality: N/A;   ESOPHAGOGASTRODUODENOSCOPY (EGD) WITH PROPOFOL N/A 01/15/2022   Procedure: ESOPHAGOGASTRODUODENOSCOPY (EGD) WITH PROPOFOL;  Surgeon: Rush Landmark Telford Nab., MD;  Location: [facility];  Service: Gastroenterology;  Laterality: N/A;   EYE SURGERY  2019, 2020   Cataracts, eye shunt implants   LEFT HEART CATH AND CORONARY ANGIOGRAPHY N/A 12/17/2018   Procedure: LEFT HEART CATH AND CORONARY ANGIOGRAPHY;  Surgeon: Belva Crome, MD;  Location: [facility];  Service: Cardiovascular;  Laterality: N/A;   POLYPECTOMY  01/15/2022   Procedure: POLYPECTOMY;  Surgeon: Rush Landmark Telford Nab., MD;  Location: [facility];  Service: Gastroenterology;;   SUPRACERVICAL ABDOMINAL HYSTERECTOMY  1986   h/o supracervical hysterectomy   UPPER GASTROINTESTINAL ENDOSCOPY     Allergies  Allergen Reactions   Sulfamethoxazole-Trimethoprim Other (See Comments)  and Rash    Chills/achy Flu like symptoms    Conjugated Estrogens     Other reaction(s): rash   Wellbutrin [Bupropion Hcl] Rash    rash   Prior to Admission medications   Medication Sig Start Date End Date Taking? Authorizing Provider  AMBULATORY NON FORMULARY MEDICATION Apply 1 drop topically at bedtime. Terbinafine DMSO 3.34% susp 06/15/21  Yes [provider]  Ascorbic Acid (VITAMIN C) 500 MG CAPS Take 1 tablet by mouth daily.   Yes [provider]  aspirin 81 MG EC tablet Take 81 mg by mouth every evening.    Yes [provider]  B Complex Vitamins (VITAMIN B COMPLEX) TABS Take 1 tablet by mouth daily.   Yes [provider]  benazepril (LOTENSIN) 20 MG tablet TAKE 1 TABLET BY MOUTH DAILY 09/28/21  Yes Belva Crome, MD  brimonidine [facility]) 0.2 % ophthalmic solution Place 1 drop into both eyes 3 (three) times daily.  11/20/18  Yes [provider]  clopidogrel (PLAVIX) 75 MG tablet Take 1 tablet (75 mg total) by mouth daily. Please call to schedule an overdue appointment with Dr. Tamala Julian for refills, [phone], thank you. 1st attempt. 02/20/22  Yes Belva Crome, MD  dorzolamide-timolol (COSOPT) 22.3-6.8 MG/ML ophthalmic solution Place 1 drop into the right eye 2 (two) times daily. 06/29/19  Yes [provider]  doxycycline (ADOXA) 50 MG tablet Take 50 mg by mouth 2 (two) times daily. Temporarily for sinus infection   Yes [provider]  ezetimibe (ZETIA) 10 MG tablet TAKE 1 TABLET BY MOUTH DAILY 10/16/21  Yes Belva Crome, MD  fexofenadine (ALLEGRA) 60 MG tablet Take 60 mg by mouth as needed for allergies or rhinitis.   Yes [provider]  folic acid (FOLVITE) 1 MG tablet Take 1 mg by mouth daily.    Yes [provider]  hydrochlorothiazide (MICROZIDE) 12.5 MG capsule TAKE 1 CAPSULE EVERY MON-WED-FRI Please call to schedule an overdue appointment with Dr. Tamala Julian for refills, [phone], thank you. 1st  attempt. 02/20/22  Yes Belva Crome, MD  hydrocortisone (CORTEF) 10 MG tablet Take 10 mg by mouth daily. 07/08/21  Yes [provider]  hyoscyamine (LEVSIN SL) 0.125 MG SL tablet DISSOLVE 1 TO 2 TABLETS IN MOUTH UNDER THE TONGUE TWICE DAILY AS NEEDED 04/19/21  Yes Milus Banister, MD  methotrexate (RHEUMATREX) 2.5 MG tablet Take by mouth once a week. Mondays Take 6 tablets  Yes [provider]  metoprolol succinate (TOPROL-XL) 25 MG 24 hr tablet Take 1 tablet (25 mg total) by mouth daily. 01/15/22  Yes Belva Crome, MD  Multiple Vitamins-Minerals (MULTIVITAMIN WITH MINERALS) tablet Take 1 tablet by mouth daily.   Yes [provider]  Omega-3 Fatty Acids (FISH OIL) 1000 MG CAPS Take 1 capsule by mouth daily.    Yes [provider]  pantoprazole (PROTONIX) 40 MG tablet Take 1 tablet (40 mg total) by mouth daily. 07/26/21  Yes Zehr, Janett Billow D, PA-C  rosuvastatin (CRESTOR) 20 MG tablet TAKE 1 TABLET BY MOUTH DAILY 10/16/21  Yes Belva Crome, MD  [facility] ARIA 50 MG/4ML SOLN injection Inject 50 mg into the vein. Every 60 Days 07/16/19  Yes [provider]  topiramate (TOPAMAX) 50 MG tablet  03/19/22  Yes [provider]   Social History   Socioeconomic History   Marital status: Married    Spouse name: [name]   Number of children: 0   Years of education: Not on file   Highest education level: Master's degree (e.g., MA, MS, MEng, MEd, MSW, MBA)  Occupational History   Occupation: Special Ed Teacher    Comment: Engineer, manufacturing  Tobacco Use   Smoking status: Former    Packs/day: 0.25    Years: 36.00    Total pack years: 9.00    Types: Cigarettes    Quit date: 08/01/2017    Years since quitting: 4.6   Smokeless tobacco: Never   Tobacco comments:    Started smoking at about 72 yrs old  Vaping Use   Vaping Use: Never used  Substance and Sexual Activity   Alcohol use: Not Currently   Drug use: No   Sexual activity: Not Currently     Partners: Male    Birth control/protection: Post-menopausal, Surgical    Comment: Hysterectomy  Other Topics Concern   Not on file  Social History Narrative   Not on file   Social Determinants of Health   Financial Resource Strain: Low Risk  (08/03/2021)   Overall Financial Resource Strain (CARDIA)    Difficulty of Paying Living Expenses: Not hard at all  Food Insecurity: No Food Insecurity (08/03/2021)   Hunger Vital Sign    Worried About Running Out of Food in the Last Year: Never true    [place] in the Last Year: Never true  Transportation Needs: No Transportation Needs (08/03/2021)   PRAPARE - Hydrologist (Medical): No    Lack of Transportation (Non-Medical): No  Physical Activity: Sufficiently Active (08/03/2021)   Exercise Vital Sign    Days of Exercise per Week: 6 days    Minutes of Exercise per Session: 30 min  Stress: No Stress Concern Present (08/03/2021)   [place]    Feeling of Stress : Not at all  Social Connections: Moderately Integrated (08/03/2021)   Social Connection and Isolation Panel [NHANES]    Frequency of Communication with Friends and Family: More than three times a week    Frequency of Social Gatherings with Friends and Family: More than three times a week    Attends Religious Services: More than 4 times per year    Active Member of [employer] or Organizations: No    Attends Archivist Meetings: Never    Marital Status: Married  Human resources officer Violence: Not At Risk (08/03/2021)   Humiliation, Afraid, Rape, and Kick questionnaire  Fear of Current or Ex-Partner: No    Emotionally Abused: No    Physically Abused: No    Sexually Abused: No    Review of Systems   Objective:   Vitals:   04/09/22 0807  BP: 110/66  Pulse: 78  Temp: 98.1 F (36.7 C)  TempSrc: Oral  SpO2: 97%  Weight: 201 lb 12.8 oz (91.5 kg)  Height: '5\' 6"'$  (1.676 m)      Physical Exam Vitals reviewed.  Constitutional:      Appearance: Normal appearance. She is well-developed.  HENT:     Head: Normocephalic and atraumatic.  Eyes:     Conjunctiva/sclera: Conjunctivae normal.     Pupils: Pupils are equal, round, and reactive to light.  Neck:     Vascular: No carotid bruit.  Cardiovascular:     Rate and Rhythm: Normal rate and regular rhythm.     Heart sounds: Normal heart sounds.  Pulmonary:     Effort: Pulmonary effort is normal.     Breath sounds: Normal breath sounds.  Abdominal:     Palpations: Abdomen is soft. There is no pulsatile mass.     Tenderness: There is no abdominal tenderness.  Musculoskeletal:     Right lower leg: No edema.     Left lower leg: No edema.  Skin:    General: Skin is warm and dry.  Neurological:     Mental Status: She is alert and oriented to person, place, and time.  Psychiatric:        Mood and Affect: Mood normal.        Behavior: Behavior normal.        Assessment & Plan:  DHARA SCHEPP is a 72 y.o. female . Prediabetes - Plan: Comprehensive metabolic panel, Hemoglobin A1c Weight stable, follow-up with weight management as planned.  Updated A1c ordered.  Essential hypertension - Plan: Comprehensive metabolic panel  -Some low readings at home, recommended holding hydrochlorothiazide temporarily unless blood pressures increase.  Was only taking 3 days/week previously.  Also needs follow-up with cardiology, she will schedule.  Mixed hyperlipidemia - Plan: Comprehensive metabolic panel, Lipid panel  -Tolerating current regimen, continue Crestor and Zetia.  Cardiology follow-up as above.  Asymptomatic currently.  History of CAD, PAD.  Abnormal radiologic findings on diagnostic imaging of renal pelvis, ureter, or bladder - Plan: Ambulatory referral to Nephrology Decreased GFR - Plan: Ambulatory referral to Nephrology  -Possible medical renal disease noted on ultrasound, GFR in the 50s, suspected  component of chronic kidney disease but has been stable.  Referred to nephrology.  Continue to avoid NSAIDs, blood pressure management as above.   No orders of the defined types were placed in this encounter.  Patient Instructions  I will refer you to kidney specialist to discuss the findings on ultrasound, but kidney test is stable. Call cardiology to establish with new cardiologist.  Hold hydrochlorothiazide for now unless blood pressure increases more. No other med changes.     Signed,   Merri Ray, MD [facility] 04/09/22 8:34 AM

## 2022-04-09 NOTE — Progress Notes (Signed)
Chief Complaint:   OBESITY Pamela Lowe is here to discuss her progress with her obesity treatment plan along with follow-up of her obesity related diagnoses. Pamela Lowe is on the Category 1 Plan and states she is following her eating plan approximately 80% of the time. Pamela Lowe states she is not exercising.   Today's visit was #: 4 Starting weight: 213 lbs Starting date: 09/27/2021 Today's weight: 201 lbs Today's date: 03/22/2022 Total lbs lost to date: 12 lbs Total lbs lost since last in-office visit: 1 lb  Interim History: Net weight loss 12 LB in 5 months of medically supervised weight management.  Patient last seen 01/24/2022 by Dr. Owens Shark.  Patient feels adequately full when eating on plan.  Tries not to skip meals.  Allows a healthy snack around 3 PM.  Sugar cravings have reduced.  Her husband buys sweets.   Subjective:   1. Osteoarthritis of both knees, unspecified osteoarthritis type Patient has been seen Synvisc injection 3 times last year.  Pain has improved, she is using a knee brace.  2. Meningioma [facility]) Notes reviewed from [facility] neurosurgery, patient is healing well and seeing Ortho.  Patient has some permanent vision loss but no chronic headaches.  This has impaired her weight progress over the past 3 months.  3. Vitamin D deficiency Last vitamin D level 30.3.  Patient is taking OTC vitamin D 1000 to 2000 IU daily.  4. Polyphagia Polyphagia is worse at night.  Ophthalmology approved use of topiramate. Topiramate 50 mg tablets has not started yet.  Assessment/Plan:   1. Osteoarthritis of both knees, unspecified osteoarthritis type Patient plans to see Ortho to schedule right total knee replacement.  Patient is wearing a right knee brace when exercising.  2. Meningioma ([place]) Continue to focus on healing from meningioma removal.  3. Vitamin D deficiency Take OTC vitamin D 2000 IUs daily.  Recheck levels in 2 to 3 months.  4. Polyphagia Can begin topiramate 50 mg half tab  with dinner.  If any change in vision can stop.  5. Obesity,current BMI 35.7 1.  Resume category 1 meal plan. 2.  Continue Pamela Lowe program for exercise.  Pamela Lowe is currently in the action stage of change. As such, her goal is to continue with weight loss efforts. She has agreed to the Category 1 Plan.   Exercise goals:  Walks dog, ride stationary bike.  Right knee DJD limits walking.  Behavioral modification strategies: increasing lean protein intake, increasing vegetables, increasing water intake, decreasing eating out, keeping healthy foods in the home, and planning for success, decrease junk food.  Pamela Lowe has agreed to follow-up with our clinic in 4 weeks. She was informed of the importance of frequent follow-up visits to maximize her success with intensive lifestyle modifications for her multiple health conditions.   Objective:   Blood pressure 132/81, pulse 75, temperature 97.9 F (36.6 C), height '5\' 3"'$  (1.6 m), weight 201 lb (91.2 kg), last menstrual period 03/19/1998, SpO2 100 %. Body mass index is 35.61 kg/m.  General: Cooperative, alert, well developed, in no acute distress. HEENT: Conjunctivae and lids unremarkable. Cardiovascular: Regular rhythm.  Lungs: Normal work of breathing. Neurologic: No focal deficits.   Lab Results  Component Value Date   CREATININE 1.04 03/27/2022   BUN 24 (H) 03/27/2022   NA 141 03/27/2022   K 4.1 03/27/2022   CL 105 03/27/2022   CO2 27 03/27/2022   Lab Results  Component Value Date   ALT 33 03/27/2022   AST 34  03/27/2022   ALKPHOS 56 03/27/2022   BILITOT 0.7 03/27/2022   Lab Results  Component Value Date   HGBA1C 5.6 09/27/2021   HGBA1C 5.7 07/26/2021   HGBA1C 5.8 (A) 04/05/2021   Lab Results  Component Value Date   INSULIN 12.2 09/27/2021   Lab Results  Component Value Date   TSH 0.823 09/27/2021   Lab Results  Component Value Date   CHOL 148 09/27/2021   HDL 83 09/27/2021   LDLCALC 49 09/27/2021   TRIG 85 09/27/2021    CHOLHDL 1.9 05/29/2021   Lab Results  Component Value Date   VD25OH 30.3 09/27/2021   VD25OH 40.6 08/10/2021   VD25OH 41.0 09/21/2016   Lab Results  Component Value Date   WBC 9.8 03/27/2022   HGB 14.2 03/27/2022   HCT 43.2 03/27/2022   MCV 92.6 03/27/2022   PLT 319.0 03/27/2022   Lab Results  Component Value Date   FERRITIN 28 12/18/2021   Attestation Statements:   Reviewed by clinician on day of visit: allergies, medications, problem list, medical history, surgical history, family history, social history, and previous encounter notes.  I, Davy Pique, am acting as Location manager for Loyal Gambler, DO.  I have reviewed the above documentation for accuracy and completeness, and I agree with the above. Dell Ponto, DO

## 2022-04-09 NOTE — Patient Instructions (Addendum)
I will refer you to kidney specialist to discuss the findings on ultrasound, but kidney test is stable. Call cardiology to establish with new cardiologist.  Hold hydrochlorothiazide for now unless blood pressure increases more. No other med changes.

## 2022-04-16 DIAGNOSIS — H401133 Primary open-angle glaucoma, bilateral, severe stage: Secondary | ICD-10-CM | POA: Diagnosis not present

## 2022-04-17 NOTE — Progress Notes (Signed)
Cardiology Office Note:    Date:  04/18/2022   ID:  DARRA ROSA, DOB [birth year], MRN [id number]  PCP:  Wendie Agreste, MD   [place] Providers Cardiologist:  Sinclair Grooms, MD (Inactive)     Referring MD: Wendie Agreste, MD   Chief Complaint  Patient presents with   Follow-up    Dr. Tamala Julian pt     History of Present Illness:    Pamela Lowe is a 72 y.o. female with a hx of CAD (DES to LAD and first diagonal 12/2018), hypertension, hyperlipidemia, PAD, GERD, adrenal insufficiency, rheumatoid arthritis, sellar meningioma   Last seen in our office by Dr. Tamala Julian on 05/29/2021, at that time she was doing well from a cardiac perspective.  She was recovering from a sellar meningioma that was removed at [facility] in November 2022.  Saw her PCP on 04/09/22, marginally low BP readings and her HCTZ was held. She was referred to nephrology for abnormal radiological findings of her kidney, overall stable kidney function.   She presents today for follow-up of her coronary artery disease and bruising.  She has had several specialist appointments within the last few months for concerns related to her bleeding.  She saw Dr. Annamaria Boots with hematology, overall there was nothing that was attributing to her bruising.  She was also evaluated by GI for concerns of bloating and GERD, she underwent an EGD and colonoscopy, overall results were reassuring. Healing hematoma to the right inner aspect of her knee, she shows a picture of her RUA with similar sized hematoma (now already healed).  She denies hematochezia, melena, hemoptysis.  She did have 1 episode of a nosebleed that lasted for approximately 20 minutes.  It is hard to determine the cause of her nosebleed as the graft from her sellar meningioma removal is still healing.  She will see her specialist at [facility] on 04/20/2022 and plans to let them know of her recent nosebleed. She denies chest pain, palpitations, dyspnea, pnd, orthopnea,  n, v, dizziness, syncope, edema, weight gain, or early satiety.  She was unaware that her PCP had told her to stop her HCTZ, thought he just mentioned that she should, so she has continued to take it. She advised he was concerned that her BP was too low and this may decrease the perfusion to her kidneys.   She continues to be active, she rides her stationary bike every morning for 30 to 45 minutes.  She was checking her blood pressure daily for her PCP however she is no longer checking it.  Will have her check it daily for the next 2 weeks  Past Medical History:  Diagnosis Date   Allergic rhinitis    Allergy    Arthritis    Brain tumor ([place])    Breast cyst 2007   Right   Cataract    surgery 07/2017   Clotting disorder ([place])    On plavix since stents  for artery blockages   Depression 2006   w/ menopause   Depression screening 09/27/2021   Family history of adverse reaction to anesthesia    sister PONV   Family history of breast cancer    Family history of colon cancer    Family history of kidney cancer 01/15/2018   Family history of lung cancer    Family history of pancreatic cancer    Family history of prostate cancer    Family history of uterine cancer    GERD (gastroesophageal  reflux disease)    Glaucoma    Heart murmur    History of hysterectomy, supracervical    Hyperlipidemia    Hypertension    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   MLH1 [id number] (Splice site)   Multinodular goiter    PAD (peripheral artery disease) ([place])    LE Art [place] 1/18: Occluded prox-mid R SFA // ABIs 9/22: R 0.83; L 1.0   Pustular psoriasis    Tobacco user     Past Surgical History:  Procedure Laterality Date   [place]   BIOPSY  01/15/2022   Procedure: BIOPSY;  Surgeon: Irving Copas., MD;  Location: [facility];  Service: Gastroenterology;;   BRAIN TUMOR EXCISION  02/24/2021   at [place] Right 07/2017   COLONOSCOPY     COLONOSCOPY WITH PROPOFOL N/A 01/15/2022   Procedure: COLONOSCOPY WITH PROPOFOL;  Surgeon: Irving Copas., MD;  Location: [facility];  Service: Gastroenterology;  Laterality: N/A;   CORONARY STENT INTERVENTION N/A 12/26/2018   Procedure: CORONARY STENT INTERVENTION;  Surgeon: Belva Crome, MD;  Location: [place] [facility];  Service: Cardiovascular;  Laterality: N/A;   ESOPHAGOGASTRODUODENOSCOPY (EGD) WITH PROPOFOL N/A 01/15/2022   Procedure: ESOPHAGOGASTRODUODENOSCOPY (EGD) WITH PROPOFOL;  Surgeon: Rush Landmark Telford Nab., MD;  Location: [facility];  Service: Gastroenterology;  Laterality: N/A;   EYE SURGERY  2019, 2020   Cataracts, eye shunt implants   LEFT HEART CATH AND CORONARY ANGIOGRAPHY N/A 12/17/2018   Procedure: LEFT HEART CATH AND CORONARY ANGIOGRAPHY;  Surgeon: Belva Crome, MD;  Location: [place] [facility];  Service: Cardiovascular;  Laterality: N/A;   POLYPECTOMY  01/15/2022   Procedure: POLYPECTOMY;  Surgeon: Rush Landmark Telford Nab., MD;  Location: [facility];  Service: Gastroenterology;;   SUPRACERVICAL ABDOMINAL HYSTERECTOMY  1986   h/o supracervical hysterectomy   UPPER GASTROINTESTINAL ENDOSCOPY      Current Medications: Current Meds  Medication Sig   AMBULATORY NON FORMULARY MEDICATION Apply 1 drop topically at bedtime. Terbinafine DMSO 3.34% susp   Ascorbic Acid (VITAMIN C) 500 MG CAPS Take 1 tablet by mouth daily.   aspirin 81 MG EC tablet Take 81 mg by mouth every evening.    B Complex Vitamins (VITAMIN B COMPLEX) TABS Take 1 tablet by mouth daily.   benazepril (LOTENSIN) 20 MG tablet TAKE 1 TABLET BY MOUTH DAILY   brimonidine (ALPHAGAN) 0.2 % ophthalmic solution Place 1 drop into both eyes 3 (three) times daily.    clopidogrel (PLAVIX) 75 MG tablet Take 1 tablet (75 mg total) by mouth daily. Please call to schedule an overdue appointment with Dr. Tamala Julian for refills, [phone], thank you. 1st attempt.    dorzolamide-timolol (COSOPT) 22.3-6.8 MG/ML ophthalmic solution Place 1 drop into the right eye 2 (two) times daily.   ezetimibe (ZETIA) 10 MG tablet TAKE 1 TABLET BY MOUTH DAILY   folic acid (FOLVITE) 1 MG tablet Take 1 mg by mouth daily.    hydrocortisone (CORTEF) 10 MG tablet Take 10 mg by mouth daily.   hyoscyamine (LEVSIN SL) 0.125 MG SL tablet DISSOLVE 1 TO 2 TABLETS IN MOUTH UNDER THE TONGUE TWICE DAILY AS NEEDED   methotrexate (RHEUMATREX) 2.5 MG tablet Take by mouth once a week. Mondays Take 6 tablets   metoprolol succinate (TOPROL-XL) 25 MG 24 hr tablet Take 1 tablet (25 mg total) by mouth daily.   Multiple Vitamins-Minerals (MULTIVITAMIN WITH MINERALS) tablet Take 1 tablet  by mouth daily.   Omega-3 Fatty Acids (FISH OIL) 1000 MG CAPS Take 1 capsule by mouth daily.    pantoprazole (PROTONIX) 40 MG tablet Take 1 tablet (40 mg total) by mouth daily.   rosuvastatin (CRESTOR) 20 MG tablet TAKE 1 TABLET BY MOUTH DAILY   SIMPONI ARIA 50 MG/4ML SOLN injection Inject 50 mg into the vein. Every 60 Days   topiramate (TOPAMAX) 50 MG tablet Take 50 mg by mouth daily.     Allergies:   Sulfamethoxazole-trimethoprim, Conjugated estrogens, and Wellbutrin [bupropion hcl]   Social History   Socioeconomic History   Marital status: Married    Spouse name: [name]   Number of children: 0   Years of education: Not on file   Highest education level: Master's degree (e.g., MA, MS, MEng, MEd, MSW, MBA)  Occupational History   Occupation: Special Ed Pharmacist, hospital    Comment: Engineer, manufacturing  Tobacco Use   Smoking status: Former    Packs/day: 0.25    Years: 36.00    Total pack years: 9.00    Types: Cigarettes    Quit date: 08/01/2017    Years since quitting: 4.7   Smokeless tobacco: Never   Tobacco comments:    Started smoking at about 72 yrs old  Vaping Use   Vaping Use: Never used  Substance and Sexual Activity   Alcohol use: Not Currently   Drug use: No   Sexual activity: Not Currently     Partners: Male    Birth control/protection: Post-menopausal, Surgical    Comment: Hysterectomy  Other Topics Concern   Not on file  Social History Narrative   Not on file   Social Determinants of Health   Financial Resource Strain: Low Risk  (08/03/2021)   Overall Financial Resource Strain (CARDIA)    Difficulty of Paying Living Expenses: Not hard at all  Food Insecurity: No Food Insecurity (08/03/2021)   Hunger Vital Sign    Worried About Running Out of Food in the Last Year: Never true    [place] in the Last Year: Never true  Transportation Needs: No Transportation Needs (08/03/2021)   PRAPARE - Hydrologist (Medical): No    Lack of Transportation (Non-Medical): No  Physical Activity: Sufficiently Active (08/03/2021)   Exercise Vital Sign    Days of Exercise per Week: 6 days    Minutes of Exercise per Session: 30 min  Stress: No Stress Concern Present (08/03/2021)   Halfway    Feeling of Stress : Not at all  Social Connections: Moderately Integrated (08/03/2021)   Social Connection and Isolation Panel [NHANES]    Frequency of Communication with Friends and Family: More than three times a week    Frequency of Social Gatherings with Friends and Family: More than three times a week    Attends Religious Services: More than 4 times per year    Active Member of [employer] or Organizations: No    Attends Music therapist: Never    Marital Status: Married     Family History: The patient's family history includes Allergies in her brother; Alzheimer's disease in her mother; Arthritis in her father and mother; Brain cancer in her paternal aunt; Breast cancer (age of onset: 31) in her sister; Breast cancer (age of onset: 49) in her sister; Breast cancer (age of onset: 17) in her sister; Breast cancer (age of onset: 34) in her cousin; Breast cancer (age  of onset: 44) in her maternal  aunt; Cancer in her sister, sister, and sister; Colon cancer (age of onset: 56) in her paternal grandfather; Deep vein thrombosis in her mother and sister; Early death in her brother, father, and sister; Heart attack in her maternal grandfather; Heart attack (age of onset: 78) in her brother; Hypertension in her father and mother; Kidney cancer (age of onset: 35) in her cousin; Kidney cancer (age of onset: 46) in her father; Kidney cancer (age of onset: 27) in her cousin; Kidney disease in her father; Lung cancer (age of onset: 30) in her paternal uncle; Lung cancer (age of onset: 88) in her maternal uncle; Osteopenia in her mother; Pancreatic cancer (age of onset: 56) in her paternal uncle; Pancreatic cancer (age of onset: 79) in her paternal aunt; Pancreatic cancer (age of onset: 3) in her paternal aunt; Pneumonia in her father; Prostate cancer (age of onset: 34) in her cousin; Prostate cancer (age of onset: 26) in her maternal uncle; Uterine cancer (age of onset: 60) in her cousin; Uterine cancer (age of onset: 83) in her paternal grandmother; Varicose Veins in her mother. There is no history of Thyroid disease, Rectal cancer, Stomach cancer, Esophageal cancer, Inflammatory bowel disease, or Liver disease.  ROS:   Please see the history of present illness.     All other systems reviewed and are negative.  EKGs/Labs/Other Studies Reviewed:    The following studies were reviewed today:  12/26/18 Coronary stent intervention -  Successful stent implantation in the mid segment of the second diagonal reducing 95% stenosis to 0% with TIMI grade III flow using a 2.0 x 18 Onyx postdilated the 2.25 mm in diameter.   Successful overlapping stent implantation in the mid to distal LAD using a 2.5 x 26 stent proximally overlapping a 22 x 2.5 mm stent more distally all postdilated to 2.75 mm.  Total occlusion with TIMI grade 0 flow was reduced to 0% with TIMI grade III flow.   RECOMMENDATIONS:   Continue aspirin  and clopidogrel indefinitely Monitor symptoms for continued angina and if present perhaps the more proximal eccentric disease that involves the second diagonal may need therapy with PCI. Aggressive preventive therapy aiming to get LDL less than 55  Echocardiogram 05/27/2018  1. The left ventricle has normal systolic function with an ejection  fraction of 60-65%. The cavity size was normal. There is mildly increased  left ventricular wall thickness. Left ventricular diastolic Doppler  parameters are consistent with impaired  relaxation.   2. The right ventricle has normal systolic function. The cavity was  normal. There is no increase in right ventricular wall thickness.   3. There is mild mitral annular calcification present.   4. Mild thickening of the aortic valve Mild calcification of the aortic  valve.   5. There is mild dilatation of the ascending aorta measuring 39 mm.   EKG:  EKG is not ordered today.    Recent Labs: 09/27/2021: TSH 0.823 03/27/2022: Hemoglobin 14.2; Platelets 319.0 04/09/2022: ALT 31; BUN 24; Creatinine, Ser 1.12; Potassium 3.7; Sodium 139  Recent Lipid Panel    Component Value Date/Time   CHOL 143 04/09/2022 0837   CHOL 148 09/27/2021 0900   TRIG 87.0 04/09/2022 0837   HDL 69.90 04/09/2022 0837   HDL 83 09/27/2021 0900   CHOLHDL 2 04/09/2022 0837   VLDL 17.4 04/09/2022 0837   LDLCALC 56 04/09/2022 0837   LDLCALC 49 09/27/2021 0900     Risk Assessment/Calculations:  Physical Exam:    VS:  BP 106/68   Pulse 77   Ht '5\' 6"'$  (1.676 m)   Wt 203 lb 3.2 oz (92.2 kg)   LMP 03/19/1998 Comment: h/o supracervical hysterectomy  SpO2 95%   BMI 32.80 kg/m     Wt Readings from Last 3 Encounters:  04/18/22 203 lb 3.2 oz (92.2 kg)  04/18/22 199 lb (90.3 kg)  04/09/22 201 lb 12.8 oz (91.5 kg)     GEN:  Well nourished, well developed in no acute distress HEENT: Normal NECK: No JVD; No carotid bruits LYMPHATICS: No  lymphadenopathy CARDIAC: RRR, no murmurs, rubs, gallops RESPIRATORY:  Clear to auscultation without rales, wheezing or rhonchi  ABDOMEN: Soft, non-tender, non-distended MUSCULOSKELETAL:  trace sockline edema; No deformity  SKIN: Warm and dry, healing hematoma noted to right inner aspect of her knee NEUROLOGIC:  Alert and oriented x 3 PSYCHIATRIC:  Normal affect   ASSESSMENT:    1. Coronary artery disease involving native coronary artery of native heart with other form of angina pectoris (HCC)   2. Easy bruising   3. Essential hypertension   4. Mixed hyperlipidemia   5. Adrenal insufficiency (HCC)   6. Stage 3a chronic kidney disease (HCC)    PLAN:    In order of problems listed above:  Coronary artery disease - s/p DES to LAD and first diagonal 12/2018.  Denies chest pain or acute decompensation. Exercised daily on stationary bike for 30-45 minutes/day.  Continue metoprolol, aspirin, Plavix (indefinite DAPT), rosuvastatin.   Easy bruising - healing hematoma noted to right inner aspect of her knee ~ silver dollar size. She shows a picture of her RUA with similar sized hematoma (now already healed). She has followed up with hematology and GI, with no evident reason for her bruising. Recent nosebleed, hard to decipher if it is r/t healing graft from her transphenoidal removal of meningioma. She will see her specialist at [facility] on 04[facility]20/22 and will d/w him. Discussed that the addition of hydrocortisone could be the culprit of her bruising as this was added within the last year. Recent work up has been unremarkable, hgb 14.2, HCT 43.2, plt 319. If the bruising persists, may need to consider stopping her ASA.   HTN - BP today 106/68, well controlled, denies orthostasis, was advised by her PCP to stop her HCTZ as she had a lower BP reading in his office, she checked her BP per his request and had several readings that were ~ 56'E/33'I systolic.  She continued to take her HCTZ (was not sure she was  supposed to stop). Ok to stop HCTZ, she will check her BP daily x 2 weeks and report any untoward readings. Continue benazepril and metoprolol.   HLD - LDL on 04/09/22 was 56, controlled on current regimen.   Adrenal insufficiency - managed by endocrinology, could be contributing to her easy bruising.   CKD IIIa - has a referral to see nephrology, avoid nephrotoxic agents. HCTZ was stopped d/t low BP readings by PCP.    Disposition - return in 6 months with Richardson Dopp PA.         Medication Adjustments/Labs and Tests Ordered: Current medicines are reviewed at length with the patient today.  Concerns regarding medicines are outlined above.  No orders of the defined types were placed in this encounter.  No orders of the defined types were placed in this encounter.   Patient Instructions  Medication Instructions:  Your physician has recommended you make the following  change in your medication:   STOP Hydrochlorothiazide.    Your physician has requested that you regularly monitor and record your blood pressure readings at home, 1 time a day.  . Please use the same machine at the same time of day.  If you get readings over 140/90 or lower than 90 on the top, call us and let us know.   Please monitor blood pressures and keep a log of your readings.    Make sure to check 2 hours after your medications.    AVOID these things for 30 minutes before checking your blood pressure: No Drinking caffeine. No Drinking alcohol. No Eating. No Smoking. No Exercising.   Five minutes before checking your blood pressure: Pee. Sit in a dining chair. Avoid sitting in a soft couch or armchair. Be quiet. Do not talk   *If you need a refill on your cardiac medications before your next appointment, please call your pharmacy*   Lab Work: None ordered  If you have labs (blood work) drawn today and your tests are completely normal, you will receive your results only by: [place] (if you  have MyChart) OR A paper copy in the mail If you have any lab test that is abnormal or we need to change your treatment, we will call you to review the results.   Testing/Procedures: None ordered   Follow-Up: At [facility], you and your health needs are our priority.  As part of our continuing mission to provide you with exceptional heart care, we have created designated Provider Care Teams.  These Care Teams include your primary Cardiologist (physician) and Advanced Practice Providers (APPs -  Physician Assistants and Nurse Practitioners) who all work together to provide you with the care you need, when you need it.  We recommend signing up for the patient portal called "MyChart".  Sign up information is provided on this After Visit Summary.  MyChart is used to connect with patients for Virtual Visits (Telemedicine).  Patients are able to view lab/test results, encounter notes, upcoming appointments, etc.  Non-urgent messages can be sent to your provider as well.   To learn more about what you can do with MyChart, go to [email or web address].    Your next appointment:   6 month(s)  Provider:   Richardson Dopp, PA-C         Other Instructions      Signed, Trudi Ida, NP  04/18/2022 4:21 PM    [place]

## 2022-04-18 ENCOUNTER — Ambulatory Visit (HOSPITAL_BASED_OUTPATIENT_CLINIC_OR_DEPARTMENT_OTHER)
Admission: RE | Admit: 2022-04-18 | Discharge: 2022-04-18 | Disposition: A | Discharge status: Home / Self Care | Payer: Medicare Other | Source: Ambulatory Visit | Attending: Cardiology | Admitting: Cardiology

## 2022-04-18 ENCOUNTER — Ambulatory Visit (INDEPENDENT_AMBULATORY_CARE_PROVIDER_SITE_OTHER): Payer: Medicare Other | Admitting: Cardiology

## 2022-04-18 ENCOUNTER — Encounter: Payer: Self-pay | Admitting: Cardiology

## 2022-04-18 VITALS — BP 106/68 | HR 77 | Ht 66.0 in | Wt 203.2 lb

## 2022-04-18 DIAGNOSIS — R233 Spontaneous ecchymoses: Secondary | ICD-10-CM

## 2022-04-18 DIAGNOSIS — Z87891 Personal history of nicotine dependence: Secondary | ICD-10-CM | POA: Diagnosis not present

## 2022-04-18 DIAGNOSIS — E782 Mixed hyperlipidemia: Secondary | ICD-10-CM | POA: Diagnosis present

## 2022-04-18 DIAGNOSIS — E2740 Unspecified adrenocortical insufficiency: Secondary | ICD-10-CM | POA: Diagnosis present

## 2022-04-18 DIAGNOSIS — N1831 Chronic kidney disease, stage 3a: Secondary | ICD-10-CM

## 2022-04-18 DIAGNOSIS — I25118 Atherosclerotic heart disease of native coronary artery with other forms of angina pectoris: Secondary | ICD-10-CM | POA: Insufficient documentation

## 2022-04-18 DIAGNOSIS — I10 Essential (primary) hypertension: Secondary | ICD-10-CM | POA: Insufficient documentation

## 2022-04-18 NOTE — Patient Instructions (Signed)
Medication Instructions:  Your physician has recommended you make the following change in your medication:   STOP Hydrochlorothiazide.    Your physician has requested that you regularly monitor and record your blood pressure readings at home, 1 time a day.  . Please use the same machine at the same time of day.  If you get readings over 140/90 or lower than 90 on the top, call us and let us know.   Please monitor blood pressures and keep a log of your readings.    Make sure to check 2 hours after your medications.    AVOID these things for 30 minutes before checking your blood pressure: No Drinking caffeine. No Drinking alcohol. No Eating. No Smoking. No Exercising.   Five minutes before checking your blood pressure: Pee. Sit in a dining chair. Avoid sitting in a soft couch or armchair. Be quiet. Do not talk   *If you need a refill on your cardiac medications before your next appointment, please call your pharmacy*   Lab Work: None ordered  If you have labs (blood work) drawn today and your tests are completely normal, you will receive your results only by: [place] (if you have MyChart) OR A paper copy in the mail If you have any lab test that is abnormal or we need to change your treatment, we will call you to review the results.   Testing/Procedures: None ordered   Follow-Up: At [facility], you and your health needs are our priority.  As part of our continuing mission to provide you with exceptional heart care, we have created designated Provider Care Teams.  These Care Teams include your primary Cardiologist (physician) and Advanced Practice Providers (APPs -  Physician Assistants and Nurse Practitioners) who all work together to provide you with the care you need, when you need it.  We recommend signing up for the patient portal called "MyChart".  Sign up information is provided on this After Visit Summary.  MyChart is used to connect with patients for  Virtual Visits (Telemedicine).  Patients are able to view lab/test results, encounter notes, upcoming appointments, etc.  Non-urgent messages can be sent to your provider as well.   To learn more about what you can do with MyChart, go to [email or web address].    Your next appointment:   6 month(s)  Provider:   Richardson Dopp, PA-C         Other Instructions

## 2022-04-19 ENCOUNTER — Encounter: Payer: Self-pay | Admitting: Pulmonary Disease

## 2022-04-19 ENCOUNTER — Ambulatory Visit (INDEPENDENT_AMBULATORY_CARE_PROVIDER_SITE_OTHER): Payer: Medicare Other | Admitting: Pulmonary Disease

## 2022-04-19 ENCOUNTER — Telehealth: Payer: Self-pay | Admitting: *Deleted

## 2022-04-19 ENCOUNTER — Telehealth: Payer: Self-pay | Admitting: Acute Care

## 2022-04-19 VITALS — BP 140/80 | HR 60 | Ht 66.5 in | Wt 202.0 lb

## 2022-04-19 DIAGNOSIS — R918 Other nonspecific abnormal finding of lung field: Secondary | ICD-10-CM

## 2022-04-19 DIAGNOSIS — R911 Solitary pulmonary nodule: Secondary | ICD-10-CM | POA: Diagnosis not present

## 2022-04-19 DIAGNOSIS — Z87891 Personal history of nicotine dependence: Secondary | ICD-10-CM | POA: Diagnosis not present

## 2022-04-19 NOTE — Telephone Encounter (Signed)
See other telephone note from 04/19/22

## 2022-04-19 NOTE — Progress Notes (Signed)
Synopsis: Referred in February 2024 for lung nodule by Wendie Agreste, MD  Subjective:   PATIENT ID: Pamela Lowe GENDER: female DOB: 14-Sep-1950, MRN: [id number]  Chief Complaint  Patient presents with   Consult    Lung nodule.    This is a 72 year old female enrolled in our lung cancer screening program.  She has a family history of breast cancer colon cancer kidney cancer lung cancer pancreatic cancer prostate cancer uterine cancer.  She herself has Lynch syndrome.  She has hypertension and hyperlipidemia.Patient is enrolled in a lung cancer screening program had a lung cancer screening CT complete yesterday.  This reveals a slowly enlarging irregular 10 mm solid nodule in the right upper lobe that had increased from previous CT imaging in size.    Past Medical History:  Diagnosis Date   Allergic rhinitis    Allergy    Arthritis    Brain tumor ([place])    Breast cyst 2007   Right   Cataract    surgery 07/2017   Clotting disorder ([place])    On plavix since stents  for artery blockages   Depression 2006   w/ menopause   Depression screening 09/27/2021   Family history of adverse reaction to anesthesia    sister PONV   Family history of breast cancer    Family history of colon cancer    Family history of kidney cancer 01/15/2018   Family history of lung cancer    Family history of pancreatic cancer    Family history of prostate cancer    Family history of uterine cancer    GERD (gastroesophageal reflux disease)    Glaucoma    Heart murmur    History of hysterectomy, supracervical    Hyperlipidemia    Hypertension    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   MLH1 [id number] (Splice site)   Multinodular goiter    PAD (peripheral artery disease) ([place])    LE Art [place] 1/18: Occluded prox-mid R SFA // ABIs 9/22: R 0.83; L 1.0   Pustular psoriasis    Tobacco user      Family History  Problem Relation Age of Onset   Hypertension Mother    Deep vein thrombosis Mother     Alzheimer's disease Mother    Osteopenia Mother    Arthritis Mother    Varicose Veins Mother    Kidney cancer Father 39   Pneumonia Father    Hypertension Father    Arthritis Father    Early death Father    Kidney disease Father    Breast cancer Sister 6       recurrence 25   Cancer Sister    Breast cancer Sister 47       Stage 0   Cancer Sister    Breast cancer Sister 28       Stage 3, bilateral mastectomy   Cancer Sister    Early death Sister    Deep vein thrombosis Sister            Allergies Brother    Early death Brother    Heart attack Brother 10   Breast cancer Maternal Aunt 67   Prostate cancer Maternal Uncle 68   Lung cancer Maternal Uncle 60   Pancreatic cancer Paternal Aunt 64   Pancreatic cancer Paternal Aunt 27   Brain cancer Paternal Aunt    Pancreatic cancer Paternal Uncle 20   Lung cancer Paternal Uncle 101   Heart attack Maternal Grandfather  Uterine cancer Paternal Grandmother 22   Colon cancer Paternal Grandfather 30   Kidney cancer Cousin 55   Kidney cancer Cousin 52   Uterine cancer Cousin 62   Prostate cancer Cousin 57   Breast cancer Cousin 60   Thyroid disease Neg Hx    Rectal cancer Neg Hx    Stomach cancer Neg Hx    Esophageal cancer Neg Hx    Inflammatory bowel disease Neg Hx    Liver disease Neg Hx      Past Surgical History:  Procedure Laterality Date   ABDOMINAL HYSTERECTOMY  1995   BIOPSY  01/15/2022   Procedure: BIOPSY;  Surgeon: Irving Copas., MD;  Location: [facility];  Service: Gastroenterology;;   BRAIN TUMOR EXCISION  02/24/2021   at [facility]     CATARACT EXTRACTION Right 07/2017   COLONOSCOPY     COLONOSCOPY WITH PROPOFOL N/A 01/15/2022   Procedure: COLONOSCOPY WITH PROPOFOL;  Surgeon: Irving Copas., MD;  Location: [facility];  Service: Gastroenterology;  Laterality: N/A;   CORONARY STENT INTERVENTION N/A 12/26/2018   Procedure:  CORONARY STENT INTERVENTION;  Surgeon: Belva Crome, MD;  Location: [facility];  Service: Cardiovascular;  Laterality: N/A;   ESOPHAGOGASTRODUODENOSCOPY (EGD) WITH PROPOFOL N/A 01/15/2022   Procedure: ESOPHAGOGASTRODUODENOSCOPY (EGD) WITH PROPOFOL;  Surgeon: Rush Landmark Telford Nab., MD;  Location: [facility];  Service: Gastroenterology;  Laterality: N/A;   EYE SURGERY  2019, 2020   Cataracts, eye shunt implants   LEFT HEART CATH AND CORONARY ANGIOGRAPHY N/A 12/17/2018   Procedure: LEFT HEART CATH AND CORONARY ANGIOGRAPHY;  Surgeon: Belva Crome, MD;  Location: [facility];  Service: Cardiovascular;  Laterality: N/A;   POLYPECTOMY  01/15/2022   Procedure: POLYPECTOMY;  Surgeon: Rush Landmark Telford Nab., MD;  Location: [facility];  Service: Gastroenterology;;   SUPRACERVICAL ABDOMINAL HYSTERECTOMY  1986   h/o supracervical hysterectomy   UPPER GASTROINTESTINAL ENDOSCOPY      Social History   Socioeconomic History   Marital status: Married    Spouse name: [name]   Number of children: 0   Years of education: Not on file   Highest education level: Master's degree (e.g., MA, MS, MEng, MEd, MSW, MBA)  Occupational History   Occupation: Special Ed Teacher    Comment: Engineer, manufacturing  Tobacco Use   Smoking status: Former    Packs/day: 0.25    Years: 36.00    Total pack years: 9.00    Types: Cigarettes    Quit date: 08/01/2017    Years since quitting: 4.7   Smokeless tobacco: Never   Tobacco comments:    Started smoking at about 72 yrs old  Vaping Use   Vaping Use: Never used  Substance and Sexual Activity   Alcohol use: Not Currently   Drug use: No   Sexual activity: Not Currently    Partners: Male    Birth control/protection: Post-menopausal, Surgical    Comment: Hysterectomy  Other Topics Concern   Not on file  Social History Narrative   Not on file   Social Determinants of Health   Financial Resource Strain: Low Risk  (08/03/2021)   Overall Financial  Resource Strain (CARDIA)    Difficulty of Paying Living Expenses: Not hard at all  Food Insecurity: No Food Insecurity (08/03/2021)   Hunger Vital Sign    Worried About Running Out of Food in the Last Year: Never true    Ran Out of  Food in the Last Year: Never true  Transportation Needs: No Transportation Needs (08/03/2021)   PRAPARE - Hydrologist (Medical): No    Lack of Transportation (Non-Medical): No  Physical Activity: Sufficiently Active (08/03/2021)   Exercise Vital Sign    Days of Exercise per Week: 6 days    Minutes of Exercise per Session: 30 min  Stress: No Stress Concern Present (08/03/2021)   [place]    Feeling of Stress : Not at all  Social Connections: Moderately Integrated (08/03/2021)   Social Connection and Isolation Panel [NHANES]    Frequency of Communication with Friends and Family: More than three times a week    Frequency of Social Gatherings with Friends and Family: More than three times a week    Attends Religious Services: More than 4 times per year    Active Member of [employer] or Organizations: No    Attends Archivist Meetings: Never    Marital Status: Married  Human resources officer Violence: Not At Risk (08/03/2021)   Humiliation, Afraid, Rape, and Kick questionnaire    Fear of Current or Ex-Partner: No    Emotionally Abused: No    Physically Abused: No    Sexually Abused: No     Allergies  Allergen Reactions   Sulfamethoxazole-Trimethoprim Other (See Comments) and Rash    Chills/achy Flu like symptoms    Conjugated Estrogens     Other reaction(s): rash   Wellbutrin [Bupropion Hcl] Rash    rash     Outpatient Medications Prior to Visit  Medication Sig Dispense Refill   AMBULATORY NON FORMULARY MEDICATION Apply 1 drop topically at bedtime. Terbinafine DMSO 3.34% susp     Ascorbic Acid (VITAMIN C) 500 MG CAPS Take 1 tablet by mouth daily.     aspirin  81 MG EC tablet Take 81 mg by mouth every evening.      B Complex Vitamins (VITAMIN B COMPLEX) TABS Take 1 tablet by mouth daily.     benazepril (LOTENSIN) 20 MG tablet TAKE 1 TABLET BY MOUTH DAILY 90 tablet 2   brimonidine (ALPHAGAN) 0.2 % ophthalmic solution Place 1 drop into both eyes 3 (three) times daily.      clopidogrel (PLAVIX) 75 MG tablet Take 1 tablet (75 mg total) by mouth daily. Please call to schedule an overdue appointment with Dr. Tamala Julian for refills, [phone], thank you. 1st attempt. 90 tablet 1   dorzolamide-timolol (COSOPT) 22.3-6.8 MG/ML ophthalmic solution Place 1 drop into the right eye 2 (two) times daily.     ezetimibe (ZETIA) 10 MG tablet TAKE 1 TABLET BY MOUTH DAILY 90 tablet 3   folic acid (FOLVITE) 1 MG tablet Take 1 mg by mouth daily.      hydrocortisone (CORTEF) 10 MG tablet Take 10 mg by mouth daily.     hyoscyamine (LEVSIN SL) 0.125 MG SL tablet DISSOLVE 1 TO 2 TABLETS IN MOUTH UNDER THE TONGUE TWICE DAILY AS NEEDED 50 tablet 1   methotrexate (RHEUMATREX) 2.5 MG tablet Take by mouth once a week. Mondays Take 6 tablets     metoprolol succinate (TOPROL-XL) 25 MG 24 hr tablet Take 1 tablet (25 mg total) by mouth daily. 90 tablet 1   Multiple Vitamins-Minerals (MULTIVITAMIN WITH MINERALS) tablet Take 1 tablet by mouth daily.     Omega-3 Fatty Acids (FISH OIL) 1000 MG CAPS Take 1 capsule by mouth daily.      pantoprazole (PROTONIX) 40 MG  tablet Take 1 tablet (40 mg total) by mouth daily. 90 tablet 3   rosuvastatin (CRESTOR) 20 MG tablet TAKE 1 TABLET BY MOUTH DAILY 90 tablet 3   SIMPONI ARIA 50 MG/4ML SOLN injection Inject 50 mg into the vein. Every 60 Days     topiramate (TOPAMAX) 50 MG tablet Take 50 mg by mouth daily.     No facility-administered medications prior to visit.    Review of Systems  Constitutional:  Negative for chills, fever, malaise/fatigue and weight loss.  HENT:  Negative for hearing loss, sore throat and tinnitus.   Eyes:  Negative for  blurred vision and double vision.  Respiratory:  Negative for cough, hemoptysis, sputum production, shortness of breath, wheezing and stridor.   Cardiovascular:  Negative for chest pain, palpitations, orthopnea, leg swelling and PND.  Gastrointestinal:  Negative for abdominal pain, constipation, diarrhea, heartburn, nausea and vomiting.  Genitourinary:  Negative for dysuria, hematuria and urgency.  Musculoskeletal:  Negative for joint pain and myalgias.  Skin:  Negative for itching and rash.  Neurological:  Negative for dizziness, tingling, weakness and headaches.  Endo/Heme/Allergies:  Negative for environmental allergies. Does not bruise/bleed easily.  Psychiatric/Behavioral:  Negative for depression. The patient is not nervous/anxious and does not have insomnia.   All other systems reviewed and are negative.    Objective:  Physical Exam Vitals reviewed.  Constitutional:      General: She is not in acute distress.    Appearance: She is well-developed.  HENT:     Head: Normocephalic and atraumatic.  Eyes:     General: No scleral icterus.    Conjunctiva/sclera: Conjunctivae normal.     Pupils: Pupils are equal, round, and reactive to light.  Neck:     Vascular: No JVD.     Trachea: No tracheal deviation.  Cardiovascular:     Rate and Rhythm: Normal rate and regular rhythm.     Heart sounds: Normal heart sounds. No murmur heard. Pulmonary:     Effort: Pulmonary effort is normal. No tachypnea, accessory muscle usage or respiratory distress.     Breath sounds: No stridor. No wheezing, rhonchi or rales.  Abdominal:     General: There is no distension.     Palpations: Abdomen is soft.     Tenderness: There is no abdominal tenderness.  Musculoskeletal:        General: No tenderness.     Cervical back: Neck supple.  Lymphadenopathy:     Cervical: No cervical adenopathy.  Skin:    General: Skin is warm and dry.     Capillary Refill: Capillary refill takes less than 2 seconds.      Findings: No rash.  Neurological:     Mental Status: She is alert and oriented to person, place, and time.  Psychiatric:        Behavior: Behavior normal.      Vitals:   04/19/22 1457  BP: (!) 140/80  Pulse: 60  SpO2: 97%  Weight: 202 lb (91.6 kg)  Height: 5' 6.5" (1.689 m)   97% on RA BMI Readings from Last 3 Encounters:  04/19/22 32.12 kg/m  04/18/22 32.12 kg/m  04/18/22 32.80 kg/m   Wt Readings from Last 3 Encounters:  04/19/22 202 lb (91.6 kg)  04/18/22 199 lb (90.3 kg)  04/18/22 203 lb 3.2 oz (92.2 kg)     CBC    Component Value Date/Time   WBC 9.8 03/27/2022 1209   RBC 4.67 03/27/2022 1209   HGB 14.2 03/27/2022  1209   HGB 13.5 04/06/2013 1527   HCT 43.2 03/27/2022 1209   PLT 319.0 03/27/2022 1209   MCV 92.6 03/27/2022 1209   MCH 30.7 12/18/2021 1507   MCHC 33.0 03/27/2022 1209   RDW 14.4 03/27/2022 1209   LYMPHSABS 3.0 12/18/2021 1507   MONOABS 0.5 12/18/2021 1507   EOSABS 0.1 12/18/2021 1507   BASOSABS 0.1 12/18/2021 1507     Chest Imaging:  Lung cancer screening CT 04/18/2022: Slowly enlarging small nodule in the right upper lobe now 10 mm in size. Appears to be subsolid in nature. The patient's images have been independently reviewed by me.    Pulmonary Functions Testing Results:     No data to display          FeNO:   Pathology:   Echocardiogram:   Heart Catheterization:     Assessment & Plan:     ICD-10-CM   1. Lung nodule  R91.1 NM PET Image Initial (PI) Skull Base To Thigh (F-18 FDG)    2. Former smoker  Z87.891     3. Abnormal CT lung screening  R91.8       Discussion:  This is a 72 year old female seen today for a right upper lobe pulmonary nodule.  Patient was screened yesterday with a lung cancer screening for annual follow-up that demonstrated a right upper lobe nodule that is slightly increased in size.  Now 10 mm subsolid lesion.  Plan: Today in the office to about restratification for pulmonary nodules  including watchful waiting with continued follow-up to observe any behavior change or consideration for PET scan imaging. I did explain that a subsolid lesion of hers may not be PET avid even though it could potentially still be a malignancy. We are going to move forward with a PET scan for neck step evaluation. At that time after we have the PET scan back we will make some decisions whether or not we want to continue with conservative image follow-up in the next 3 months or consideration for robotic assisted navigational bronchoscopy and tissue sampling. We talked about all of this today in the office and she would like to move forward with PET scan follow-up with [place] in a couple of weeks and decide whether or not we need to move forward with biopsy. RTC 4 weeks.  After PET scan complete   Current Outpatient Medications:    AMBULATORY NON FORMULARY MEDICATION, Apply 1 drop topically at bedtime. Terbinafine DMSO 3.34% susp, Disp: , Rfl:    Ascorbic Acid (VITAMIN C) 500 MG CAPS, Take 1 tablet by mouth daily., Disp: , Rfl:    aspirin 81 MG EC tablet, Take 81 mg by mouth every evening. , Disp: , Rfl:    B Complex Vitamins (VITAMIN B COMPLEX) TABS, Take 1 tablet by mouth daily., Disp: , Rfl:    benazepril (LOTENSIN) 20 MG tablet, TAKE 1 TABLET BY MOUTH DAILY, Disp: 90 tablet, Rfl: 2   brimonidine (ALPHAGAN) 0.2 % ophthalmic solution, Place 1 drop into both eyes 3 (three) times daily. , Disp: , Rfl:    clopidogrel (PLAVIX) 75 MG tablet, Take 1 tablet (75 mg total) by mouth daily. Please call to schedule an overdue appointment with Dr. Tamala Julian for refills, [phone], thank you. 1st attempt., Disp: 90 tablet, Rfl: 1   dorzolamide-timolol (COSOPT) 22.3-6.8 MG/ML ophthalmic solution, Place 1 drop into the right eye 2 (two) times daily., Disp: , Rfl:    ezetimibe (ZETIA) 10 MG tablet, TAKE 1 TABLET BY MOUTH DAILY,  Disp: 90 tablet, Rfl: 3   folic acid (FOLVITE) 1 MG tablet, Take 1 mg by mouth daily. , Disp: ,  Rfl:    hydrocortisone (CORTEF) 10 MG tablet, Take 10 mg by mouth daily., Disp: , Rfl:    hyoscyamine (LEVSIN SL) 0.125 MG SL tablet, DISSOLVE 1 TO 2 TABLETS IN MOUTH UNDER THE TONGUE TWICE DAILY AS NEEDED, Disp: 50 tablet, Rfl: 1   methotrexate (RHEUMATREX) 2.5 MG tablet, Take by mouth once a week. Mondays Take 6 tablets, Disp: , Rfl:    metoprolol succinate (TOPROL-XL) 25 MG 24 hr tablet, Take 1 tablet (25 mg total) by mouth daily., Disp: 90 tablet, Rfl: 1   Multiple Vitamins-Minerals (MULTIVITAMIN WITH MINERALS) tablet, Take 1 tablet by mouth daily., Disp: , Rfl:    Omega-3 Fatty Acids (FISH OIL) 1000 MG CAPS, Take 1 capsule by mouth daily. , Disp: , Rfl:    pantoprazole (PROTONIX) 40 MG tablet, Take 1 tablet (40 mg total) by mouth daily., Disp: 90 tablet, Rfl: 3   rosuvastatin (CRESTOR) 20 MG tablet, TAKE 1 TABLET BY MOUTH DAILY, Disp: 90 tablet, Rfl: 3   SIMPONI ARIA 50 MG/4ML SOLN injection, Inject 50 mg into the vein. Every 60 Days, Disp: , Rfl:    topiramate (TOPAMAX) 50 MG tablet, Take 50 mg by mouth daily., Disp: , Rfl:   I spent 62 minutes dedicated to the care of this patient on the date of this encounter to include pre-visit review of records, face-to-face time with the patient discussing conditions above, post visit ordering of testing, clinical documentation with the electronic health record, making appropriate referrals as documented, and communicating necessary findings to members of the patients care team.    Garner Nash, DO [facility] 04/19/2022 3:38 PM

## 2022-04-19 NOTE — Telephone Encounter (Signed)
Results/plan of LDCT faxed to PCP

## 2022-04-19 NOTE — Telephone Encounter (Signed)
Call report on chest LDCT done 04/18/22:   IMPRESSION: 1. Irregular 10 mm solid nodule of the right upper lobe is increased in size. Lung-RADS 4B, suspicious. Additional imaging evaluation or consultation with Pulmonology or Thoracic Surgery recommended. 2. Aortic Atherosclerosis (ICD10-I70.0) and Emphysema (ICD10-J43.9).     Electronically Signed   By: Yetta Glassman M.D.   On: 04/18/2022 22:31

## 2022-04-19 NOTE — Patient Instructions (Signed)
Thank you for visiting Dr. Valeta Harms at [facility]. Today we recommend the following:  Orders Placed This Encounter  Procedures   NM PET Image Initial (PI) Skull Base To Thigh (F-18 FDG)   Follow up after your PET is complete.   Return in about 4 weeks (around 05/17/2022) for with Eric Form, NP, or Dr. Valeta Harms.    Please do your part to reduce the spread of COVID-19.

## 2022-04-19 NOTE — Telephone Encounter (Signed)
I have called the patient with the results of her low dose CT Chest. I explained that her scan was read as a 4B as there is a RUL nodule that has increased in size from 3.9 mm 04/2021 to 10 mm 03/2022.I explained that this is concerning growth, and that we need to do additional imaging, and have her see one of our lung nodule specialists.  Dr. Valeta Harms had an opening in his schedule today at 2:30, and Ms. Thorington is going to see him then to discuss best next steps. Plan is for a PET scan after she sees Dr. Valeta Harms today, and based on PET results , most likely tissue sampling. Pt. Verbalized understanding of the above and had no questions at completion of the call. I have given her our address for the visit today. Langley Gauss, please fax results to PCP and let him know plan of care. Thanks so much

## 2022-04-20 DIAGNOSIS — J3489 Other specified disorders of nose and nasal sinuses: Secondary | ICD-10-CM | POA: Diagnosis not present

## 2022-04-20 DIAGNOSIS — J324 Chronic pansinusitis: Secondary | ICD-10-CM | POA: Diagnosis not present

## 2022-04-20 DIAGNOSIS — D329 Benign neoplasm of meninges, unspecified: Secondary | ICD-10-CM | POA: Diagnosis not present

## 2022-04-25 ENCOUNTER — Encounter (INDEPENDENT_AMBULATORY_CARE_PROVIDER_SITE_OTHER): Payer: Self-pay | Admitting: Family Medicine

## 2022-04-25 ENCOUNTER — Ambulatory Visit (INDEPENDENT_AMBULATORY_CARE_PROVIDER_SITE_OTHER): Payer: Medicare Other | Admitting: Family Medicine

## 2022-04-25 VITALS — BP 126/76 | HR 82 | Temp 98.3°F | Ht 66.0 in | Wt 199.0 lb

## 2022-04-25 DIAGNOSIS — R7303 Prediabetes: Secondary | ICD-10-CM | POA: Diagnosis not present

## 2022-04-25 DIAGNOSIS — R632 Polyphagia: Secondary | ICD-10-CM | POA: Diagnosis not present

## 2022-04-25 DIAGNOSIS — I10 Essential (primary) hypertension: Secondary | ICD-10-CM

## 2022-04-25 DIAGNOSIS — Z6835 Body mass index (BMI) 35.0-35.9, adult: Secondary | ICD-10-CM

## 2022-04-25 DIAGNOSIS — E669 Obesity, unspecified: Secondary | ICD-10-CM

## 2022-04-25 DIAGNOSIS — E559 Vitamin D deficiency, unspecified: Secondary | ICD-10-CM | POA: Diagnosis not present

## 2022-04-25 DIAGNOSIS — K219 Gastro-esophageal reflux disease without esophagitis: Secondary | ICD-10-CM

## 2022-04-30 ENCOUNTER — Ambulatory Visit: Payer: Medicare Other | Admitting: Family Medicine

## 2022-05-04 ENCOUNTER — Encounter (HOSPITAL_COMMUNITY)
Admission: RE | Admit: 2022-05-04 | Discharge: 2022-05-04 | Disposition: A | Discharge status: Home / Self Care | Payer: Medicare Other | Source: Ambulatory Visit | Attending: Pulmonary Disease | Admitting: Pulmonary Disease

## 2022-05-04 DIAGNOSIS — R911 Solitary pulmonary nodule: Secondary | ICD-10-CM | POA: Diagnosis not present

## 2022-05-04 LAB — GLUCOSE, CAPILLARY: Glucose-Capillary: 104 mg/dL — ABNORMAL HIGH (ref 70–99)

## 2022-05-04 MED ORDER — FLUDEOXYGLUCOSE F - 18 (FDG) INJECTION
10.0000 | Freq: Once | INTRAVENOUS | Status: AC | PRN
  Administered 2022-05-04: 10 via INTRAVENOUS

## 2022-05-07 DIAGNOSIS — M059 Rheumatoid arthritis with rheumatoid factor, unspecified: Secondary | ICD-10-CM | POA: Diagnosis not present

## 2022-05-09 NOTE — Progress Notes (Signed)
Chief Complaint:   OBESITY Pamela Lowe is here to discuss her progress with her obesity treatment plan along with follow-up of her obesity related diagnoses. Pamela Lowe is on the Category 1 Plan and states she is following her eating plan approximately 90% of the time. Pamela Lowe states she is riding a stationary bike for 30 minutes 4 times per week.  Today's visit was #: 5 Starting weight: 213 LBS Starting date: 09/27/2021 Today's weight: 201 LBS Today's date: 04/25/2022 Total lbs lost to date: 12 LBS Total lbs lost since last in-office visit: 2 LBS  Interim History: Patient had a sinus infection between visits.  Has not been weight training.  Doing exercise videos and using a stationary bike.  Patient denies hunger or cravings.  Subjective:   1. Essential hypertension Patient is of HCTZ with weight loss.  Body water is down on bioimpedance.  Blood pressure is well-controlled.  2. Polyphagia Patient started topiramate 50 mg daily prescribed by Dr. Manson Passey for food impulse control.  Patient had a recent abdominal ultrasound reviewed from 03/29/2022 and is set up to see nephrology.  Patient complains of right flank pain.  3. Gastroesophageal reflux disease, unspecified whether esophagitis present EGD reviewed from Dr.Mansouraty on 01/15/2022.  Patient had a 2 cm HH.  Changed to famotidine.  4. Vitamin D deficiency Patient was taking a OTC vitamin D 2000 IU daily.  Patient's energy level is stable.  Last vitamin D level was 30.3.  5. Pre-diabetes On 04/09/2022 A1c was 5.9 up from 5.6.  Patient has lost 13 LBS since 6 months of medically supervised weight management.  Patient has been limiting intake of sugar.  Assessment/Plan:   1. Essential hypertension Continue benazepril 20 mg daily and Toprol XL 25 mg daily.  2. Polyphagia Discontinued topiramate due to risk of nephrolithiasis.  3. Gastroesophageal reflux disease, unspecified whether esophagitis present Avoid high acid foods and  late-night eating.  4. Vitamin D deficiency Plan to recheck vitamin D level with next labs.  5. Pre-diabetes Continue active plan for weight reduction.  She read labels on foods and drinks with sugar avoiding products with greater than 8 g of sugar per serving.  6. Morbid obesity (HCC)  7. Obesity,current BMI 35.4 1.  Adding resistance training 3 times per week. 2.  Reviewed easy dinners to stay on plan.  Pamela Lowe is currently in the action stage of change. As such, her goal is to continue with weight loss efforts. She has agreed to the Category 1 Plan.   Exercise goals:  As is.  Behavioral modification strategies: increasing lean protein intake, increasing vegetables, increasing water intake, meal planning and cooking strategies, keeping healthy foods in the home, avoiding temptations, and planning for success.  Pamela Lowe has agreed to follow-up with our clinic in 4 weeks. She was informed of the importance of frequent follow-up visits to maximize her success with intensive lifestyle modifications for her multiple health conditions.   Objective:   Blood pressure 126/76, pulse 82, temperature 98.3 F (36.8 C), height 5\' 6"  (1.676 m), weight 199 lb (90.3 kg), last menstrual period 03/19/1998, SpO2 97 %. Body mass index is 32.12 kg/m.  General: Cooperative, alert, well developed, in no acute distress. HEENT: Conjunctivae and lids unremarkable. Cardiovascular: Regular rhythm.  Lungs: Normal work of breathing. Neurologic: No focal deficits.   Lab Results  Component Value Date   CREATININE 1.12 04/09/2022   BUN 24 (H) 04/09/2022   NA 139 04/09/2022   K 3.7 04/09/2022   CL  106 04/09/2022   CO2 26 04/09/2022   Lab Results  Component Value Date   ALT 31 04/09/2022   AST 36 04/09/2022   ALKPHOS 55 04/09/2022   BILITOT 0.9 04/09/2022   Lab Results  Component Value Date   HGBA1C 5.9 04/09/2022   HGBA1C 5.6 09/27/2021   HGBA1C 5.7 07/26/2021   HGBA1C 5.8 (A) 04/05/2021   Lab  Results  Component Value Date   INSULIN 12.2 09/27/2021   Lab Results  Component Value Date   TSH 0.823 09/27/2021   Lab Results  Component Value Date   CHOL 143 04/09/2022   HDL 69.90 04/09/2022   LDLCALC 56 04/09/2022   TRIG 87.0 04/09/2022   CHOLHDL 2 04/09/2022   Lab Results  Component Value Date   VD25OH 30.3 09/27/2021   VD25OH 40.6 08/10/2021   VD25OH 41.0 09/21/2016   Lab Results  Component Value Date   WBC 9.8 03/27/2022   HGB 14.2 03/27/2022   HCT 43.2 03/27/2022   MCV 92.6 03/27/2022   PLT 319.0 03/27/2022   Lab Results  Component Value Date   FERRITIN 28 12/18/2021   Attestation Statements:   Reviewed by clinician on day of visit: allergies, medications, problem list, medical history, surgical history, family history, social history, and previous encounter notes.  I, Malcolm Metro, am acting as Energy manager for Seymour Bars, DO.  I have reviewed the above documentation for accuracy and completeness, and I agree with the above. Glennis Brink, DO

## 2022-05-11 ENCOUNTER — Ambulatory Visit
Admission: RE | Admit: 2022-05-11 | Discharge: 2022-05-11 | Disposition: A | Discharge status: Home / Self Care | Payer: Medicare Other | Source: Ambulatory Visit | Attending: Endocrinology | Admitting: Endocrinology

## 2022-05-11 DIAGNOSIS — E049 Nontoxic goiter, unspecified: Secondary | ICD-10-CM

## 2022-05-11 DIAGNOSIS — E042 Nontoxic multinodular goiter: Secondary | ICD-10-CM | POA: Diagnosis not present

## 2022-05-15 NOTE — Progress Notes (Signed)
Sarah, patient seeing you tomorrow in clinic for pet follow up.   Thanks,  BLI  Garner Nash, DO [facility] 05/15/2022 1:35 PM

## 2022-05-16 ENCOUNTER — Other Ambulatory Visit: Payer: Self-pay

## 2022-05-16 ENCOUNTER — Encounter: Payer: Self-pay | Admitting: Acute Care

## 2022-05-16 ENCOUNTER — Ambulatory Visit (INDEPENDENT_AMBULATORY_CARE_PROVIDER_SITE_OTHER): Payer: Medicare Other | Admitting: Acute Care

## 2022-05-16 VITALS — BP 128/80 | HR 58 | Temp 98.1°F | Ht 66.5 in | Wt 196.4 lb

## 2022-05-16 DIAGNOSIS — R911 Solitary pulmonary nodule: Secondary | ICD-10-CM

## 2022-05-16 DIAGNOSIS — M87059 Idiopathic aseptic necrosis of unspecified femur: Secondary | ICD-10-CM | POA: Diagnosis not present

## 2022-05-16 DIAGNOSIS — Z87891 Personal history of nicotine dependence: Secondary | ICD-10-CM

## 2022-05-16 DIAGNOSIS — E049 Nontoxic goiter, unspecified: Secondary | ICD-10-CM

## 2022-05-16 DIAGNOSIS — I25118 Atherosclerotic heart disease of native coronary artery with other forms of angina pectoris: Secondary | ICD-10-CM | POA: Diagnosis not present

## 2022-05-16 NOTE — Patient Instructions (Addendum)
It is good to see you today, and finally meet you. Your PET scan did not show significant  hypermetabolism. We will continue to watch this nodule closely. We will do a 3 month follow up scan abd re-evaluate at that time.  If it continues to grow , we will consider biopsy. I am glad you are feeling better after your surgery/ Follow up with Dr. Carlota Raspberry regarding the incidental finding of Probable right femoral head avascular necrosis. We will call you with the results of the follow up CT scan.  Have a great Spring.

## 2022-05-16 NOTE — Progress Notes (Signed)
History of Present Illness Pamela Lowe is a 72 y.o. female with hypertension and hyperlipidemia, and a slowly enlarging irregular 10 mm solid nodule in the right upper lobe that had increased from previous CT imaging in size.She was referred to Dr. Valeta Harms for risk stratification.  Synopsis This is a 72 year old female enrolled in our lung cancer screening program. She has a family history of breast cancer colon cancer kidney cancer lung cancer pancreatic cancer prostate cancer uterine cancer. She herself has Lynch syndrome. She has hypertension and hyperlipidemia.Patient is enrolled in a lung cancer screening program had a lung cancer screening CT complete yesterday. This reveals a slowly enlarging irregular 10 mm solid nodule in the right upper lobe that had increased from previous CT imaging in size. She saw Dr. Valeta Harms 04/19/2022 in the  office to discuss risk stratification for pulmonary nodules including watchful waiting with continued follow-up to observe any behavior change or consideration for PET scan imaging. I did explain that a subsolid lesion of hers may not be PET avid even though it could potentially still be a malignancy. We are going to move forward with a PET scan for next  step evaluation. Plan was for follow up after PET to  make some decisions regarding whether or not we want to continue with conservative image follow-up in the next 3 months or consideration for robotic assisted navigational bronchoscopy and tissue sampling.She is here today for follow up.   05/16/2022 Pt. Presents for follow up after abnormal CT Chest and PET scan. Her PET scan did not show significant hypermetabolism, but per the radiology read did point out that the 9 mm nodule is at the low end of PET resolution. They recommended 3 month follow up Ct chest to re-evaluate the nodule for growth. Patient is in agreement with this plan. If the nodule does show continued growth, patient will most likely opt for biopsy  with her family history.   Pt. Did have an [place] of her thyroid gland.   Test [id number] No hypermetabolic correlate for the right upper lobe 9 mm pulmonary nodule, which is only subtly apparent, somewhat linear, and at the low end of PET resolution. Recommend chest CT follow-up at 3 months. 2. Left adrenal hypermetabolism is without CT correlate and likely physiologic. Recommend attention on follow-up. 3. Incidental findings, including: Coronary artery atherosclerosis. Aortic Atherosclerosis (ICD10-I70.0). Probable right femoral head avascular necrosis. Independently reviewed by me  Thyroid US 05/11/2022 Nodule 4: 1.8 x 1.7 x 1.2 cm spongiform fall in the inferior left thyroid lobe is not significantly changed in size since prior examination. This nodule does not meet criteria for FNA or imaging follow-up.   Remaining bilateral thyroid nodules are subcentimeter and do not meet criteria for FNA or imaging surveillance.   IMPRESSION: Bilateral thyroid nodules which do not meet criteria for FNA or imaging follow-up. Independently reviewed by me.       Latest Ref Rng & Units 03/27/2022   12:09 PM 12/18/2021    3:07 PM 09/14/2021    2:14 PM  CBC  WBC 4.0 - 10.5 K/uL 9.8  7.1  5.7   Hemoglobin 12.0 - 15.0 g/dL 14.2  13.9  13.0   Hematocrit 36.0 - 46.0 % 43.2  41.8  40.4   Platelets 150.0 - 400.0 K/uL 319.0  302  297.0        Latest Ref Rng & Units 04/09/2022    8:37 AM 03/27/2022   12:09 PM 09/27/2021    9:00 AM  BMP  Glucose 70 - 99 mg/dL 104  116  103   BUN 6 - 23 mg/dL '24  24  13   '$ Creatinine 0.40 - 1.20 mg/dL 1.12  1.04  1.03   BUN/Creat Ratio 12 - 28   13   Sodium 135 - 145 mEq/L 139  141  140   Potassium 3.5 - 5.1 mEq/L 3.7  4.1  4.2   Chloride 96 - 112 mEq/L 106  105  103   CO2 19 - 32 mEq/L '26  27  24   '$ Calcium 8.4 - 10.5 mg/dL 10.0  10.0  9.7     BNP No results found for: "BNP"  ProBNP    Component Value Date/Time   PROBNP 37 05/22/2018 1246     PFT No results found for: "FEV1PRE", "FEV1POST", "FVCPRE", "FVCPOST", "TLC", "DLCOUNC", "PREFEV1FVCRT", "PSTFEV1FVCRT"  US THYROID  Result Date: 05/11/2022 CLINICAL DATA:  Nontoxic goiter EXAM: THYROID ULTRASOUND TECHNIQUE: Ultrasound examination of the thyroid gland and adjacent soft tissues was performed. COMPARISON:  04/28/2021 FINDINGS: Parenchymal Echotexture: Mildly heterogenous Isthmus: 0.5 cm Right lobe: 5.3 x 1.4 x 2.0 cm Left lobe: 5.3 x 1.7 x 2.0 cm _________________________________________________________ Estimated total number of nodules >/= 1 cm: 1 Number of spongiform nodules >/=  2 cm not described below (TR1): 0 Number of mixed cystic and solid nodules >/= 1.5 cm not described below ([place]): 0 _________________________________________________________ Nodule 4: 1.8 x 1.7 x 1.2 cm spongiform fall in the inferior left thyroid lobe is not significantly changed in size since prior examination. This nodule does not meet criteria for FNA or imaging follow-up. Remaining bilateral thyroid nodules are subcentimeter and do not meet criteria for FNA or imaging surveillance. IMPRESSION: Bilateral thyroid nodules which do not meet criteria for FNA or imaging follow-up. The above is in keeping with the ACR TI-RADS recommendations - J Am Coll Radiol 2017;14:587-595. Electronically Signed   By: Miachel Roux M.D.   On: 05/11/2022 16:34   NM PET Image Initial (PI) Skull Base To Thigh (F-18 FDG)  Result Date: 05/04/2022 CLINICAL DATA:  Initial treatment strategy for enlarging right upper lobe pulmonary nodule on screening CT. EXAM: NUCLEAR MEDICINE PET SKULL BASE TO THIGH TECHNIQUE: 10.0 mCi F-18 FDG was injected intravenously. Full-ring PET imaging was performed from the skull base to thigh after the radiotracer. CT data was obtained and used for attenuation correction and anatomic localization. Fasting blood glucose: 104 mg/dl COMPARISON:  04/18/2022 lung cancer screening CT. FINDINGS: Mediastinal blood  pool activity: SUV max 2.7 Liver activity: SUV max NA NECK: No areas of abnormal hypermetabolism. Incidental CT findings: No cervical adenopathy. Bilateral carotid atherosclerosis. CHEST: The anterior right upper lobe pulmonary nodule is subtly present and relatively linear on 73/4. On the order of 9 mm and not hypermetabolic. No thoracic nodal hypermetabolism. Incidental CT findings: Deferred to recent diagnostic CT. Aortic and coronary artery calcification. Mild cardiomegaly. ABDOMEN/PELVIS: No abdominopelvic nodal hypermetabolism. Left adrenal hypermetabolism at a S.U.V. max of 3.8 is without correlate mass and favored to be physiologic. Incidental CT findings: Abdominal aortic atherosclerosis. Extensive colonic diverticulosis. SKELETON: No abnormal marrow activity. Incidental CT findings: Probable right femoral head avascular necrosis, suboptimally evaluated including on 179/4. IMPRESSION: 1. No hypermetabolic correlate for the right upper lobe 9 mm pulmonary nodule, which is only subtly apparent, somewhat linear, and at the low end of PET resolution. Recommend chest CT follow-up at 3 months. 2. Left adrenal hypermetabolism is without CT correlate and likely physiologic. Recommend attention on follow-up. 3.  Incidental findings, including: Coronary artery atherosclerosis. Aortic Atherosclerosis (ICD10-I70.0). Probable right femoral head avascular necrosis. Electronically Signed   By: Abigail Miyamoto M.D.   On: 05/04/2022 15:58   CT CHEST LUNG CA SCREEN LOW DOSE W/O CM  Result Date: 04/18/2022 CLINICAL DATA:  Former smoker 50 pack-year history EXAM: CT CHEST WITHOUT CONTRAST LOW-DOSE FOR LUNG CANCER SCREENING TECHNIQUE: Multidetector CT imaging of the chest was performed following the standard protocol without IV contrast. RADIATION DOSE REDUCTION: This exam was performed according to the departmental dose-optimization program which includes automated exposure control, adjustment of the mA and/or kV according to  patient size and/or use of iterative reconstruction technique. COMPARISON:  Lung cancer screening CT dated April 18, 2021 FINDINGS: Cardiovascular: Normal heart size. No pericardial effusion. Moderate coronary artery calcifications with stent of the LAD. Normal caliber thoracic aorta with moderate calcified plaque. Mediastinum/Nodes: Esophagus and thyroid are unremarkable. No pathologically enlarged lymph nodes seen in the chest. Lungs/Pleura: Central airways are patent. No consolidation, pleural effusion or pneumothorax. Irregular solid nodule of the right upper lobe is increased in size measuring 10 mm in mean diameter, previously measured 3.9 mm. Other previously seen pulmonary nodules are stable two-view of the sulcal structure. Upper Abdomen: No acute abnormality. Musculoskeletal: No chest wall mass or suspicious bone lesions identified. IMPRESSION: 1. Irregular 10 mm solid nodule of the right upper lobe is increased in size. Lung-RADS 4B, suspicious. Additional imaging evaluation or consultation with Pulmonology or Thoracic Surgery recommended. 2. Aortic Atherosclerosis (ICD10-I70.0) and Emphysema (ICD10-J43.9). Electronically Signed   By: Yetta Glassman M.D.   On: 04/18/2022 22:31     Past medical hx Past Medical History:  Diagnosis Date   Allergic rhinitis    Allergy    Arthritis    Brain tumor ([place])    Breast cyst 2007   Right   Cataract    surgery 07/2017   Clotting disorder ([place])    On plavix since stents  for artery blockages   Depression 2006   w/ menopause   Depression screening 09/27/2021   Family history of adverse reaction to anesthesia    sister PONV   Family history of breast cancer    Family history of colon cancer    Family history of kidney cancer 01/15/2018   Family history of lung cancer    Family history of pancreatic cancer    Family history of prostate cancer    Family history of uterine cancer    GERD (gastroesophageal reflux disease)    Glaucoma    Heart  murmur    History of hysterectomy, supracervical    Hyperlipidemia    Hypertension    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   MLH1 [id number] (Splice site)   Multinodular goiter    PAD (peripheral artery disease) ([place])    LE Art [place] 1/18: Occluded prox-mid R SFA // ABIs 9/22: R 0.83; L 1.0   Pustular psoriasis    Tobacco user      Social History   Tobacco Use   Smoking status: Former    Packs/day: 0.25    Years: 36.00    Total pack years: 9.00    Types: Cigarettes    Quit date: 08/01/2017    Years since quitting: 4.7   Smokeless tobacco: Never   Tobacco comments:    Started smoking at about 72 yrs old  Vaping Use   Vaping Use: Never used  Substance Use Topics   Alcohol use: Not Currently   Drug use: No  Ms.Boran reports that she quit smoking about 4 years ago. Her smoking use included cigarettes. She has a 9.00 pack-year smoking history. She has never used smokeless tobacco. She reports that she does not currently use alcohol. She reports that she does not use drugs.  Tobacco Cessation: Counseling given: Not Answered Tobacco comments: Started smoking at about 72 yrs old Former smoker , Quit 2019 with a 9 pack year smoking history  Past surgical hx, Family hx, Social hx all reviewed.  Current Outpatient Medications on File Prior to Visit  Medication Sig   AMBULATORY NON FORMULARY MEDICATION Apply 1 drop topically at bedtime. Terbinafine DMSO 3.34% susp   Ascorbic Acid (VITAMIN C) 500 MG CAPS Take 1 tablet by mouth daily.   aspirin 81 MG EC tablet Take 81 mg by mouth every evening.    B Complex Vitamins (VITAMIN B COMPLEX) TABS Take 1 tablet by mouth daily.   benazepril (LOTENSIN) 20 MG tablet TAKE 1 TABLET BY MOUTH DAILY   brimonidine (ALPHAGAN) 0.2 % ophthalmic solution Place 1 drop into both eyes 3 (three) times daily.    clopidogrel (PLAVIX) 75 MG tablet Take 1 tablet (75 mg total) by mouth daily. Please call to schedule an overdue appointment with Dr. Tamala Julian for  refills, [phone], thank you. 1st attempt.   dorzolamide-timolol (COSOPT) 22.3-6.8 MG/ML ophthalmic solution Place 1 drop into the right eye 2 (two) times daily.   ezetimibe (ZETIA) 10 MG tablet TAKE 1 TABLET BY MOUTH DAILY   folic acid (FOLVITE) 1 MG tablet Take 1 mg by mouth daily.    hydrocortisone (CORTEF) 10 MG tablet Take 10 mg by mouth daily.   hyoscyamine (LEVSIN SL) 0.125 MG SL tablet DISSOLVE 1 TO 2 TABLETS IN MOUTH UNDER THE TONGUE TWICE DAILY AS NEEDED   methotrexate (RHEUMATREX) 2.5 MG tablet Take by mouth once a week. Mondays Take 6 tablets   metoprolol succinate (TOPROL-XL) 25 MG 24 hr tablet Take 1 tablet (25 mg total) by mouth daily.   Multiple Vitamins-Minerals (MULTIVITAMIN WITH MINERALS) tablet Take 1 tablet by mouth daily.   Omega-3 Fatty Acids (FISH OIL) 1000 MG CAPS Take 1 capsule by mouth daily.    pantoprazole (PROTONIX) 40 MG tablet Take 1 tablet (40 mg total) by mouth daily.   rosuvastatin (CRESTOR) 20 MG tablet TAKE 1 TABLET BY MOUTH DAILY   SIMPONI ARIA 50 MG/4ML SOLN injection Inject 50 mg into the vein. Every 60 Days   No current facility-administered medications on file prior to visit.     Allergies  Allergen Reactions   Sulfamethoxazole-Trimethoprim Other (See Comments) and Rash    Chills/achy Flu like symptoms    Conjugated Estrogens     Other reaction(s): rash   Wellbutrin [Bupropion Hcl] Rash    rash    Review Of Systems:  Constitutional:   No  weight loss, night sweats,  Fevers, chills, fatigue, or  lassitude.  HEENT:   No headaches,  Difficulty swallowing,  Tooth/dental problems, or  Sore throat,                No sneezing, itching, ear ache, nasal congestion, post nasal drip,   CV:  No chest pain,  Orthopnea, PND, swelling in lower extremities, anasarca, dizziness, palpitations, syncope.   GI  No heartburn, indigestion, abdominal pain, nausea, vomiting, diarrhea, change in bowel habits, loss of appetite, bloody stools.   Resp: No  shortness of breath with exertion or at rest.  No excess mucus, no productive cough,  No non-productive  cough,  No coughing up of blood.  No change in color of mucus.  No wheezing.  No chest wall deformity  Skin: no rash or lesions.  GU: no dysuria, change in color of urine, no urgency or frequency.  No flank pain, no hematuria   MS:  No joint pain or swelling.  No decreased range of motion.  No back pain.  Psych:  No change in mood or affect. No depression or anxiety.  No memory loss.   Vital Signs BP 128/80   Pulse (!) 58   Temp 98.1 F (36.7 C) (Oral)   Ht 5' 6.5" (1.689 m)   Wt 196 lb 6.4 oz (89.1 kg)   LMP 03/19/1998 Comment: h/o supracervical hysterectomy  SpO2 99%   BMI 31.22 kg/m    Physical Exam:  General- No distress,  A&Ox3, pleasant  ENT: No sinus tenderness, TM clear, pale nasal mucosa, no oral exudate,no post nasal drip, no LAN Cardiac: S1, S2, regular rate and rhythm, no murmur Chest: No wheeze/ rales/ dullness; no accessory muscle use, no nasal flaring, no sternal retractions Abd.: Soft Non-tender, ND, BS +, Body mass index is 31.22 kg/m.  Ext: No clubbing cyanosis, edema Neuro:  normal strength, MAE x 4, A&O x 3 Skin: No rashes, warm and dry, no lesions  Psych: normal mood and behavior   Assessment/Plan RUL 10 mm Pulmonary Nodule with interval growth Not PET Avid on PET Former smoker  Plan Your PET scan did not show significant  hypermetabolism. We will continue to watch this nodule closely. We will do a 3 month follow up scan abd re-evaluate at that time.  If it continues to grow , we will consider biopsy.  Thyroid Nodule  [place] >> Does not meet criteria for biopsy Plan Continued surveillance per PCP  Incidental Finding of Probable right femoral head avascular necrosis. Message sent to PCP Plan  Per PCP, may need orthopedic referral  I spent  35 minutes dedicated to the care of this patient on the date of this encounter to include pre-visit  review of records, face-to-face time with the patient discussing conditions above, post visit ordering of testing, clinical documentation with the electronic health record, making appropriate referrals as documented, and communicating necessary information to the patient's healthcare team.   Magdalen Spatz, NP 05/16/2022  9:44 PM

## 2022-05-18 ENCOUNTER — Telehealth: Payer: Self-pay | Admitting: Family Medicine

## 2022-05-18 NOTE — Telephone Encounter (Signed)
Scheduled patient for Monday afternoon

## 2022-05-18 NOTE — Telephone Encounter (Signed)
Recent lung cancer screening, and PET screening for enlarging right upper lobe pulmonary nodule on screening CT.  PET scan on 05/04/2022, notation of probable right femoral head avascular necrosis.  Please schedule her to see me next week to discuss these findings, exam and discussion of next step.  Thanks.

## 2022-05-21 ENCOUNTER — Ambulatory Visit (INDEPENDENT_AMBULATORY_CARE_PROVIDER_SITE_OTHER): Payer: Medicare Other | Admitting: Family Medicine

## 2022-05-21 ENCOUNTER — Encounter: Payer: Self-pay | Admitting: Family Medicine

## 2022-05-21 ENCOUNTER — Telehealth: Payer: Self-pay | Admitting: Family Medicine

## 2022-05-21 VITALS — BP 128/72 | HR 56 | Temp 98.1°F | Ht 66.5 in | Wt 196.8 lb

## 2022-05-21 DIAGNOSIS — R9389 Abnormal findings on diagnostic imaging of other specified body structures: Secondary | ICD-10-CM

## 2022-05-21 DIAGNOSIS — I25118 Atherosclerotic heart disease of native coronary artery with other forms of angina pectoris: Secondary | ICD-10-CM

## 2022-05-21 DIAGNOSIS — Z8739 Personal history of other diseases of the musculoskeletal system and connective tissue: Secondary | ICD-10-CM | POA: Diagnosis not present

## 2022-05-21 DIAGNOSIS — Z7952 Long term (current) use of systemic steroids: Secondary | ICD-10-CM

## 2022-05-21 NOTE — Progress Notes (Signed)
Subjective:  Patient ID: [name], female    DOB: May 29, 1950  Age: 72 y.o. MRN: [id number]  CC:  Chief Complaint  Patient presents with   Results    Pt was seen for PET scan and noted an abnormality on Rt hip     HPI Kimberlynn C Caras presents for   Abnormal imaging of right hip Recently underwent PET scan for further evaluation of pulmonary nodule noted on CT.  PET scan notated probable right femoral head avascular necrosis. Has received cortisone injections for hip pain, arthritis in past - about 4 years ago, about 3 times, none needed recently. No recent hip pain, no difficulty walking. No recent injury.  No acute or suspicious osseous abnormalities on CT pelvis July 2019 seen, CT abdomen pelvis November 2020.  No recent dedicated hip imaging.  She does have history of adrenal insufficiency, treated with hydrocortisone since last year, as well as rheumatoid arthritis.  Ortho - Dr. Wynelle Link. Appt for knee issues next month.   History Patient Active Problem List   Diagnosis Date Noted   Pre-diabetes 04/25/2022   Pyrosis 03/30/2022   Bloating 03/30/2022   Abdominal pain, epigastric 03/30/2022   Polyphagia 03/22/2022   Eating disorder 12/27/2021   Prediabetes 11/16/2021   Osteoarthritis 09/28/2021   Degeneration of lumbosacral intervertebral disc 09/28/2021   Elevated ALT measurement 09/28/2021   Other fatigue 09/27/2021   SOB (shortness of breath) on exertion 09/27/2021   Elevated glucose 09/27/2021   Health care maintenance 09/27/2021   Vitamin D deficiency 09/27/2021   Depression screening 09/27/2021   Morbid obesity ([place]) 09/27/2021   Spondylosis without myelopathy 08/03/2021   Rheumatoid arthritis with rheumatoid factor ([place]) 08/03/2021   Psoriasis 08/03/2021   Hypopituitarism ([place]) 08/03/2021   Adrenal insufficiency ([place]) 08/03/2021   Gastroesophageal reflux disease 07/26/2021   Meningioma ([place]) 03/21/2021   Visual field defect 03/21/2021   Chronic left  shoulder pain 03/21/2021   Cervical radiculopathy 03/21/2021   History of pituitary surgery 03/06/2021   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   Pseudophakia of both eyes 06/29/2019   Steroid responders to glaucoma of both eyes 06/29/2019   S/P coronary artery stent placement    PAD (peripheral artery disease) (HCC)    CAD (coronary artery disease) 12/26/2018   Angina pectoris ([place])    Primary open-angle glaucoma 10/28/2018   Glaucoma associated with ocular inflammation 10/28/2018   Uveitic glaucoma of right eye, severe stage 10/28/2018   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 ([place]) 08/21/2018   Aortic atherosclerosis ([place]) 08/19/2018   Uveitis 05/05/2018   Goiter 05/05/2018   Mixed hyperlipidemia 05/05/2018   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   Genetic testing 02/07/2018   Family history of kidney cancer 01/15/2018   Family history of pancreatic cancer    Family history of breast cancer    Family history of uterine cancer    Family history of colon cancer    Family history of prostate cancer    Family history of lung cancer    Hypertensive retinopathy of both eyes 11/12/2017   Iritis of right eye 11/12/2017   Nuclear sclerotic cataract of left eye 11/12/2017   Ocular hypertension of right eye 11/12/2017   Inflammation of eye, right 08/07/2017   Memory loss 09/21/2016   Essential hypertension 09/21/2016   Low vitamin D level 09/21/2016   DOE (dyspnea on exertion) 11/08/2010   Past Medical History:  Diagnosis Date   Allergic rhinitis    Allergy  Arthritis    Brain tumor ([place])    Breast cyst 2007   Right   Cataract    surgery 07/2017   Clotting disorder ([place])    On plavix since stents  for artery blockages   Depression 2006   w/ menopause   Depression screening 09/27/2021   Family history of adverse reaction to anesthesia    sister PONV   Family history of breast cancer    Family history of colon cancer    Family history of  kidney cancer 01/15/2018   Family history of lung cancer    Family history of pancreatic cancer    Family history of prostate cancer    Family history of uterine cancer    GERD (gastroesophageal reflux disease)    Glaucoma    Heart murmur    History of hysterectomy, supracervical    Hyperlipidemia    Hypertension    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   MLH1 [id number] (Splice site)   Multinodular goiter    PAD (peripheral artery disease) ([place])    LE Art [place] 1/18: Occluded prox-mid R SFA // ABIs 9/22: R 0.83; L 1.0   Pustular psoriasis    Tobacco user    Past Surgical History:  Procedure Laterality Date   ABDOMINAL HYSTERECTOMY  1995   BIOPSY  01/15/2022   Procedure: BIOPSY;  Surgeon: Irving Copas., MD;  Location: [facility];  Service: Gastroenterology;;   BRAIN TUMOR EXCISION  02/24/2021   at [place]     CATARACT EXTRACTION Right 07/2017   COLONOSCOPY     COLONOSCOPY WITH PROPOFOL N/A 01/15/2022   Procedure: COLONOSCOPY WITH PROPOFOL;  Surgeon: Irving Copas., MD;  Location: [facility];  Service: Gastroenterology;  Laterality: N/A;   CORONARY STENT INTERVENTION N/A 12/26/2018   Procedure: CORONARY STENT INTERVENTION;  Surgeon: Belva Crome, MD;  Location: [facility];  Service: Cardiovascular;  Laterality: N/A;   ESOPHAGOGASTRODUODENOSCOPY (EGD) WITH PROPOFOL N/A 01/15/2022   Procedure: ESOPHAGOGASTRODUODENOSCOPY (EGD) WITH PROPOFOL;  Surgeon: Rush Landmark Telford Nab., MD;  Location: [facility];  Service: Gastroenterology;  Laterality: N/A;   EYE SURGERY  2019, 2020   Cataracts, eye shunt implants   LEFT HEART CATH AND CORONARY ANGIOGRAPHY N/A 12/17/2018   Procedure: LEFT HEART CATH AND CORONARY ANGIOGRAPHY;  Surgeon: Belva Crome, MD;  Location: [place] [facility];  Service: Cardiovascular;  Laterality: N/A;   POLYPECTOMY  01/15/2022   Procedure: POLYPECTOMY;  Surgeon: Rush Landmark  Telford Nab., MD;  Location: [facility];  Service: Gastroenterology;;   SUPRACERVICAL ABDOMINAL HYSTERECTOMY  1986   h/o supracervical hysterectomy   UPPER GASTROINTESTINAL ENDOSCOPY     Allergies  Allergen Reactions   Sulfamethoxazole-Trimethoprim Other (See Comments) and Rash    Chills/achy Flu like symptoms    Conjugated Estrogens     Other reaction(s): rash   Wellbutrin [Bupropion Hcl] Rash    rash   Prior to Admission medications   Medication Sig Start Date End Date Taking? Authorizing Provider  AMBULATORY NON FORMULARY MEDICATION Apply 1 drop topically at bedtime. Terbinafine DMSO 3.34% susp 06/15/21   [provider]  Ascorbic Acid (VITAMIN C) 500 MG CAPS Take 1 tablet by mouth daily.    [provider]  aspirin 81 MG EC tablet Take 81 mg by mouth every evening.     [provider]  B Complex Vitamins (VITAMIN B COMPLEX) TABS Take 1 tablet by mouth daily.    [provider]  benazepril (LOTENSIN) 20 MG tablet TAKE 1 TABLET BY MOUTH DAILY 09/28/21   Belva Crome, MD  brimonidine (ALPHAGAN) 0.2 % ophthalmic solution Place 1 drop into both eyes 3 (three) times daily.  11/20/18   [provider]  clopidogrel (PLAVIX) 75 MG tablet Take 1 tablet (75 mg total) by mouth daily. Please call to schedule an overdue appointment with Dr. Tamala Julian for refills, [phone], thank you. 1st attempt. 02/20/22   Belva Crome, MD  dorzolamide-timolol (COSOPT) 22.3-6.8 MG/ML ophthalmic solution Place 1 drop into the right eye 2 (two) times daily. 06/29/19   [provider]  ezetimibe (ZETIA) 10 MG tablet TAKE 1 TABLET BY MOUTH DAILY 10/16/21   Belva Crome, MD  folic acid (FOLVITE) 1 MG tablet Take 1 mg by mouth daily.     [provider]  hydrocortisone (CORTEF) 10 MG tablet Take 10 mg by mouth daily. 07/08/21   [provider]  hyoscyamine (LEVSIN SL) 0.125 MG SL tablet DISSOLVE 1 TO 2 TABLETS IN MOUTH UNDER THE TONGUE TWICE DAILY AS  NEEDED 04/19/21   Milus Banister, MD  methotrexate (RHEUMATREX) 2.5 MG tablet Take by mouth once a week. Mondays Take 6 tablets    [provider]  metoprolol succinate (TOPROL-XL) 25 MG 24 hr tablet Take 1 tablet (25 mg total) by mouth daily. 01/15/22   Belva Crome, MD  Multiple Vitamins-Minerals (MULTIVITAMIN WITH MINERALS) tablet Take 1 tablet by mouth daily.    [provider]  Omega-3 Fatty Acids (FISH OIL) 1000 MG CAPS Take 1 capsule by mouth daily.     [provider]  pantoprazole (PROTONIX) 40 MG tablet Take 1 tablet (40 mg total) by mouth daily. 07/26/21   Zehr, Janett Billow D, PA-C  rosuvastatin (CRESTOR) 20 MG tablet TAKE 1 TABLET BY MOUTH DAILY 10/16/21   Belva Crome, MD  [facility] ARIA 50 MG/4ML SOLN injection Inject 50 mg into the vein. Every 60 Days 07/16/19   [provider]   Social History   Socioeconomic History   Marital status: Married    Spouse name: [name]   Number of children: 0   Years of education: Not on file   Highest education level: Master's degree (e.g., MA, MS, MEng, MEd, MSW, MBA)  Occupational History   Occupation: Special Ed Teacher    Comment: Engineer, manufacturing  Tobacco Use   Smoking status: Former    Packs/day: 0.25    Years: 36.00    Total pack years: 9.00    Types: Cigarettes    Quit date: 08/01/2017    Years since quitting: 4.8   Smokeless tobacco: Never   Tobacco comments:    Started smoking at about 72 yrs old  Vaping Use   Vaping Use: Never used  Substance and Sexual Activity   Alcohol use: Not Currently   Drug use: No   Sexual activity: Not Currently    Partners: Male    Birth control/protection: Post-menopausal, Surgical    Comment: Hysterectomy  Other Topics Concern   Not on file  Social History Narrative   Not on file   Social Determinants of Health   Financial Resource Strain: Low Risk  (08/03/2021)   Overall Financial Resource Strain (CARDIA)    Difficulty of Paying Living Expenses: Not hard  at all  Food Insecurity: No Food Insecurity (08/03/2021)   Hunger Vital Sign    Worried About Running Out of Food in the Last Year: Never true  Ran Out of Food in the Last Year: Never true  Transportation Needs: No Transportation Needs (08/03/2021)   PRAPARE - Hydrologist (Medical): No    Lack of Transportation (Non-Medical): No  Physical Activity: Sufficiently Active (08/03/2021)   Exercise Vital Sign    Days of Exercise per Week: 6 days    Minutes of Exercise per Session: 30 min  Stress: No Stress Concern Present (08/03/2021)   [place]    Feeling of Stress : Not at all  Social Connections: Moderately Integrated (08/03/2021)   Social Connection and Isolation Panel [NHANES]    Frequency of Communication with Friends and Family: More than three times a week    Frequency of Social Gatherings with Friends and Family: More than three times a week    Attends Religious Services: More than 4 times per year    Active Member of [employer] or Organizations: No    Attends Archivist Meetings: Never    Marital Status: Married  Human resources officer Violence: Not At Risk (08/03/2021)   Humiliation, Afraid, Rape, and Kick questionnaire    Fear of Current or Ex-Partner: No    Emotionally Abused: No    Physically Abused: No    Sexually Abused: No    Review of Systems Per HPI.   Objective:   Vitals:   05/21/22 1418  BP: 128/72  Pulse: (!) 56  Temp: 98.1 F (36.7 C)  TempSrc: Oral  SpO2: 96%  Weight: 196 lb 12.8 oz (89.3 kg)  Height: 5' 6.5" (1.689 m)   Physical Exam Vitals reviewed.  Constitutional:      General: She is not in acute distress.    Appearance: Normal appearance. She is well-developed.  HENT:     Head: Normocephalic and atraumatic.  Cardiovascular:     Rate and Rhythm: Normal rate.  Pulmonary:     Effort: Pulmonary effort is normal.  Musculoskeletal:     Comments:  Right hip: Pain-free range of motion.  No focal bony tenderness.  Ambulating without assistive device or difficulty.  Neurological:     Mental Status: She is alert and oriented to person, place, and time.  Psychiatric:        Mood and Affect: Mood normal.     Assessment & Plan:  SHANEDRA EGNER is a 72 y.o. female . History of hip disorder - Plan: DG HIP UNILAT W OR W/O PELVIS 2-3 VIEWS RIGHT  Abnormal finding on imaging - Plan: DG HIP UNILAT W OR W/O PELVIS 2-3 VIEWS RIGHT  Current chronic use of systemic steroids  Abnormal PET scan with possible avascular necrosis.  History of prior pain thought to be arthritis, requiring multiple injections but denies recent pain.  Possible risk factor of avascular necrosis with chronic corticosteroid use.  -Check plain imaging initially with hip/pelvic x-ray, then if no definitive findings of osteonecrosis, MRI would be next step.  Depending on results will have follow-up with orthopedics.  Has already established with Dr. Wynelle Link.  RTC precautions.   No orders of the defined types were placed in this encounter.  Patient Instructions  Based on your PET scan results, I will order an xray, then if that is normal an MRI may be needed.  If any pain, let me know, but no activity restrictions for now. Depending on imaging results, can have further discussion with Dr. Wynelle Link.   For xray: [facility] or xray: Walk in 8:30-4:30 during  weekdays, no appointment needed [place].  [place], [place] [address]     Signed,   Merri Ray, MD [place] 05/21/22 2:58 PM

## 2022-05-21 NOTE — Patient Instructions (Addendum)
Based on your PET scan results, I will order an xray, then if that is normal an MRI may be needed.  If any pain, let me know, but no activity restrictions for now. Depending on imaging results, can have further discussion with Dr. Wynelle Link.   For xray: [facility] or xray: Walk in 8:30-4:30 during weekdays, no appointment needed [place].  [place], [place] [address]

## 2022-05-21 NOTE — Telephone Encounter (Signed)
Caller name: GRACIEANN CONVEY  On [place]?: Yes  Call back number: [phone] (home)  Provider they see: Wendie Agreste, MD  Reason for call: Pt called [facility] they claimed they never got referral. Pt is asking if there is another place for her to go?

## 2022-05-22 ENCOUNTER — Encounter (INDEPENDENT_AMBULATORY_CARE_PROVIDER_SITE_OTHER): Payer: Self-pay | Admitting: Family Medicine

## 2022-05-22 ENCOUNTER — Ambulatory Visit (INDEPENDENT_AMBULATORY_CARE_PROVIDER_SITE_OTHER): Payer: Medicare Other | Admitting: Family Medicine

## 2022-05-22 VITALS — BP 145/80 | HR 50 | Temp 97.6°F | Ht 66.0 in | Wt 193.0 lb

## 2022-05-22 DIAGNOSIS — Z6831 Body mass index (BMI) 31.0-31.9, adult: Secondary | ICD-10-CM | POA: Diagnosis not present

## 2022-05-22 DIAGNOSIS — R7303 Prediabetes: Secondary | ICD-10-CM

## 2022-05-22 DIAGNOSIS — I10 Essential (primary) hypertension: Secondary | ICD-10-CM | POA: Diagnosis not present

## 2022-05-22 DIAGNOSIS — E559 Vitamin D deficiency, unspecified: Secondary | ICD-10-CM | POA: Diagnosis not present

## 2022-05-22 NOTE — Assessment & Plan Note (Signed)
Blood pressure is mildly elevated today.  She has been monitoring her blood pressure readings at home usually running 120s over 70s.  She is current Toprol-XL 25 mg once daily, benazepril 20 mg once daily.  Continue current blood pressure medications.  Avoid use of stimulant type antiobesity medications and look for improvements with further weight reduction.

## 2022-05-22 NOTE — Assessment & Plan Note (Signed)
Last vitamin D Lab Results  Component Value Date   VD25OH 30.3 09/27/2021    Last vitamin D level low normal at 30.3.  We discussed a target vitamin D level of 50-70.  She denies issues with fatigue.  She has chosen over-the-counter vitamin D.  Encouraged at least 2000 IU once daily.  Recheck level in 2 months.

## 2022-05-22 NOTE — Telephone Encounter (Signed)
Referral placed 04/09/2022 can you look into this and see what is going on where she needs to go?

## 2022-05-22 NOTE — Assessment & Plan Note (Addendum)
Improving Patient has a net weight loss of 18 pounds in the past 7 months of medically supervised weight management. Reviewed her bioimpedance results.  She is gaining muscle and losing body fat and seeing inches lost. She has been able to ramp up exercise now that her RA pain has improved.  Additional meal option handouts provided today. Continue prescribed meal plan

## 2022-05-22 NOTE — Assessment & Plan Note (Signed)
Lab Results  Component Value Date   HGBA1C 5.9 04/09/2022   Reviewed labs from January including an A1c of 5.9.  She is doing a better job reducing intake of added sugar and reading labels on food and drink for sugar.  Continue to avoid products with over 8 g of added sugar per serving.  Increase walking time or other form of exercise to 30 minutes 5 days a week.  Recheck A1c in the next 2 months.

## 2022-05-22 NOTE — Telephone Encounter (Signed)
Called [facility] they said they didn't get the referral I have faxed it again and sent an email to [place] who is now doing the referrals.  Danae Chen

## 2022-05-22 NOTE — Progress Notes (Signed)
Office: [phone]  /  Fax: [phone]  WEIGHT SUMMARY AND BIOMETRICS  Vitals Temp: 97.6 F (36.4 C) BP: (!) 145/80 Pulse Rate: (!) 50 SpO2: 100 %   Anthropometric Measurements Height: '5\' 6"'$  (1.676 m) Weight: 193 lb (87.5 kg) BMI (Calculated): 31.17 Weight at Last Visit: 199lb Weight Lost Since Last Visit: 6lb Starting Weight: 213lb Total Weight Loss (lbs): 18 lb (8.165 kg)   Body Composition  Body Fat %: 42 % Fat Mass (lbs): 81.2 lbs Muscle Mass (lbs): 106.4 lbs Total Body Water (lbs): 81 lbs Visceral Fat Rating : 12   Other Clinical Data Fasting: no Labs: no Today's Visit #: 6 Starting Date: 09/27/21     HPI  Chief Complaint: OBESITY  Pamela Lowe is here to discuss her progress with her obesity treatment plan. She is on the the Category 1 Plan and states she is following her eating plan approximately 98 % of the time. She states she is exercising 30-45 minutes 5 times per week.   Interval History:  Since last office visit she is down 6 lb She was able to get back on her RA meds post sinusitis and has been more physically active She is getting all the foods on her plan Topiramate was discontinued last visit due to R flank pain Reviewed bioimpedence: up 2 lb of muscle mass, down 8.4 lb of body fat in the past 4 weeks She is doing well with limiting sweets and meals out  Pharmacotherapy: none  PHYSICAL EXAM:  Blood pressure (!) 145/80, pulse (!) 50, temperature 97.6 F (36.4 C), height '5\' 6"'$  (1.676 m), weight 193 lb (87.5 kg), last menstrual period 03/19/1998, SpO2 100 %. Body mass index is 31.15 kg/m.  General: She is overweight, cooperative, alert, well developed, and in no acute distress. PSYCH: Has normal mood, affect and thought process.   Lungs: Normal breathing effort, no conversational dyspnea.  DIAGNOSTIC DATA REVIEWED:  BMET    Component Value Date/Time   NA 139 04/09/2022 0837   NA 140 09/27/2021 0900   K 3.7 04/09/2022 0837   CL  106 04/09/2022 0837   CO2 26 04/09/2022 0837   GLUCOSE 104 (H) 04/09/2022 0837   BUN 24 (H) 04/09/2022 0837   BUN 13 09/27/2021 0900   CREATININE 1.12 04/09/2022 0837   CREATININE 1.41 (H) 12/16/2012 1554   CALCIUM 10.0 04/09/2022 0837   GFRNONAA 53 (L) 11/02/2019 1814   GFRAA >60 11/02/2019 1814   Lab Results  Component Value Date   HGBA1C 5.9 04/09/2022   HGBA1C 5.8 (A) 04/05/2021   Lab Results  Component Value Date   INSULIN 12.2 09/27/2021   Lab Results  Component Value Date   TSH 0.823 09/27/2021   CBC    Component Value Date/Time   WBC 9.8 03/27/2022 1209   RBC 4.67 03/27/2022 1209   HGB 14.2 03/27/2022 1209   HGB 13.5 04/06/2013 1527   HCT 43.2 03/27/2022 1209   PLT 319.0 03/27/2022 1209   MCV 92.6 03/27/2022 1209   MCH 30.7 12/18/2021 1507   MCHC 33.0 03/27/2022 1209   RDW 14.4 03/27/2022 1209   Iron Studies    Component Value Date/Time   FERRITIN 28 12/18/2021 1507   Lipid Panel     Component Value Date/Time   CHOL 143 04/09/2022 0837   CHOL 148 09/27/2021 0900   TRIG 87.0 04/09/2022 0837   HDL 69.90 04/09/2022 0837   HDL 83 09/27/2021 0900   CHOLHDL 2 04/09/2022 0837   VLDL 17.4  04/09/2022 0837   LDLCALC 56 04/09/2022 0837   LDLCALC 49 09/27/2021 0900   Hepatic Function Panel     Component Value Date/Time   PROT 7.1 04/09/2022 0837   PROT 6.4 09/27/2021 0900   ALBUMIN 4.0 04/09/2022 0837   ALBUMIN 3.9 09/27/2021 0900   AST 36 04/09/2022 0837   ALT 31 04/09/2022 0837   ALKPHOS 55 04/09/2022 0837   BILITOT 0.9 04/09/2022 0837   BILITOT 0.8 09/27/2021 0900   BILIDIR 0.21 05/29/2021 1136   IBILI 0.6 12/13/2012 1058      Component Value Date/Time   TSH 0.823 09/27/2021 0900   Nutritional Lab Results  Component Value Date   VD25OH 30.3 09/27/2021   VD25OH 40.6 08/10/2021   VD25OH 41.0 09/21/2016     ASSESSMENT AND PLAN  TREATMENT PLAN FOR OBESITY:  Recommended Dietary Goals  Pamela Lowe is currently in the action stage of  change. As such, her goal is to continue weight management plan. She has agreed to the Category 1 Plan.  Behavioral Intervention  We discussed the following Behavioral Modification Strategies today: increasing lean protein intake, increasing vegetables, increasing water intake, work on meal planning and easy cooking plans, decreasing eating out, consumption of processed foods, and making healthy choices when eating convenient foods, and reading food labels .  Additional resources provided today: NA  Recommended Physical Activity Goals  Pamela Lowe has been advised to work up to 150 minutes of moderate intensity aerobic activity a week and strengthening exercises 2-3 times per week for cardiovascular health, weight loss maintenance and preservation of muscle mass.   She has agreed to continue physical activity as is.    Pharmacotherapy We discussed various medication options to help Pamela Lowe with her weight loss efforts and we both agreed to none.  ASSOCIATED CONDITIONS ADDRESSED TODAY  Essential hypertension Assessment & Plan: Blood pressure is mildly elevated today.  She has been monitoring her blood pressure readings at home usually running 120s over 70s.  She is current Toprol-XL 25 mg once daily, benazepril 20 mg once daily.  Continue current blood pressure medications.  Avoid use of stimulant type antiobesity medications and look for improvements with further weight reduction.   Vitamin D deficiency Assessment & Plan: Last vitamin D Lab Results  Component Value Date   VD25OH 30.3 09/27/2021    Last vitamin D level low normal at 30.3.  We discussed a target vitamin D level of 50-70.  She denies issues with fatigue.  She has chosen over-the-counter vitamin D.  Encouraged at least 2000 IU once daily.  Recheck level in 2 months.   Pre-diabetes Assessment & Plan: Lab Results  Component Value Date   HGBA1C 5.9 04/09/2022   Reviewed labs from January including an A1c of 5.9.  She  is doing a better job reducing intake of added sugar and reading labels on food and drink for sugar.  Continue to avoid products with over 8 g of added sugar per serving.  Increase walking time or other form of exercise to 30 minutes 5 days a week.  Recheck A1c in the next 2 months.   Morbid obesity ([place]) Assessment & Plan: Improving Patient has a net weight loss of 18 pounds in the past 7 months of medically supervised weight management. Reviewed her bioimpedance results.  She is gaining muscle and losing body fat and seeing inches lost. She has been able to ramp up exercise now that her RA pain has improved.  Additional meal option handouts provided today. Continue  prescribed meal plan   BMI 31.0-31.9,adult      No follow-ups on file.[name] She was informed of the importance of frequent follow up visits to maximize her success with intensive lifestyle modifications for her multiple health conditions.   ATTESTASTION STATEMENTS:  Reviewed by clinician on day of visit: allergies, medications, problem list, medical history, surgical history, family history, social history, and previous encounter notes pertinent to obesity diagnosis.   I have personally spent 30 minutes total time today in preparation, patient care, nutritional counseling and documentation for this visit, including the following: review of clinical lab tests; review of medical tests/procedures/services.      Dell Ponto, DO

## 2022-05-30 ENCOUNTER — Ambulatory Visit (INDEPENDENT_AMBULATORY_CARE_PROVIDER_SITE_OTHER)
Admission: RE | Admit: 2022-05-30 | Discharge: 2022-05-30 | Disposition: A | Discharge status: Home / Self Care | Payer: Medicare Other | Source: Ambulatory Visit | Attending: Family Medicine | Admitting: Family Medicine

## 2022-05-30 DIAGNOSIS — Z8739 Personal history of other diseases of the musculoskeletal system and connective tissue: Secondary | ICD-10-CM

## 2022-05-30 DIAGNOSIS — M25551 Pain in right hip: Secondary | ICD-10-CM | POA: Diagnosis not present

## 2022-05-30 DIAGNOSIS — Z1231 Encounter for screening mammogram for malignant neoplasm of breast: Secondary | ICD-10-CM | POA: Diagnosis not present

## 2022-05-30 DIAGNOSIS — Z803 Family history of malignant neoplasm of breast: Secondary | ICD-10-CM | POA: Diagnosis not present

## 2022-05-30 DIAGNOSIS — R9389 Abnormal findings on diagnostic imaging of other specified body structures: Secondary | ICD-10-CM

## 2022-05-30 LAB — HM MAMMOGRAPHY

## 2022-06-01 ENCOUNTER — Encounter: Payer: Self-pay | Admitting: Family Medicine

## 2022-06-07 DIAGNOSIS — I10 Essential (primary) hypertension: Secondary | ICD-10-CM | POA: Diagnosis not present

## 2022-06-07 DIAGNOSIS — E210 Primary hyperparathyroidism: Secondary | ICD-10-CM | POA: Diagnosis not present

## 2022-06-07 DIAGNOSIS — D352 Benign neoplasm of pituitary gland: Secondary | ICD-10-CM | POA: Diagnosis not present

## 2022-06-07 DIAGNOSIS — E0590 Thyrotoxicosis, unspecified without thyrotoxic crisis or storm: Secondary | ICD-10-CM | POA: Diagnosis not present

## 2022-06-07 DIAGNOSIS — E230 Hypopituitarism: Secondary | ICD-10-CM | POA: Diagnosis not present

## 2022-06-07 DIAGNOSIS — E2740 Unspecified adrenocortical insufficiency: Secondary | ICD-10-CM | POA: Diagnosis not present

## 2022-06-11 ENCOUNTER — Other Ambulatory Visit: Payer: Self-pay | Admitting: *Deleted

## 2022-06-11 MED ORDER — BENAZEPRIL HCL 20 MG PO TABS: 20.0000 mg | ORAL_TABLET | Freq: Every day | ORAL | 1 refills | Status: DC

## 2022-06-11 MED ORDER — METOPROLOL SUCCINATE ER 25 MG PO TB24: 25.0000 mg | ORAL_TABLET | Freq: Every day | ORAL | 1 refills | Status: DC

## 2022-06-12 DIAGNOSIS — H4041X3 Glaucoma secondary to eye inflammation, right eye, severe stage: Secondary | ICD-10-CM | POA: Diagnosis not present

## 2022-06-12 DIAGNOSIS — H4042X1 Glaucoma secondary to eye inflammation, left eye, mild stage: Secondary | ICD-10-CM | POA: Diagnosis not present

## 2022-06-12 DIAGNOSIS — H209 Unspecified iridocyclitis: Secondary | ICD-10-CM | POA: Diagnosis not present

## 2022-06-13 DIAGNOSIS — E210 Primary hyperparathyroidism: Secondary | ICD-10-CM | POA: Diagnosis not present

## 2022-06-13 DIAGNOSIS — D352 Benign neoplasm of pituitary gland: Secondary | ICD-10-CM | POA: Diagnosis not present

## 2022-06-13 DIAGNOSIS — E230 Hypopituitarism: Secondary | ICD-10-CM | POA: Diagnosis not present

## 2022-06-13 DIAGNOSIS — E049 Nontoxic goiter, unspecified: Secondary | ICD-10-CM | POA: Diagnosis not present

## 2022-06-27 DIAGNOSIS — D329 Benign neoplasm of meninges, unspecified: Secondary | ICD-10-CM | POA: Diagnosis not present

## 2022-06-27 DIAGNOSIS — J322 Chronic ethmoidal sinusitis: Secondary | ICD-10-CM | POA: Diagnosis not present

## 2022-06-27 DIAGNOSIS — J3489 Other specified disorders of nose and nasal sinuses: Secondary | ICD-10-CM | POA: Diagnosis not present

## 2022-06-28 ENCOUNTER — Ambulatory Visit (INDEPENDENT_AMBULATORY_CARE_PROVIDER_SITE_OTHER): Payer: Medicare Other | Admitting: Family Medicine

## 2022-06-28 DIAGNOSIS — M1711 Unilateral primary osteoarthritis, right knee: Secondary | ICD-10-CM | POA: Diagnosis not present

## 2022-06-28 DIAGNOSIS — M170 Bilateral primary osteoarthritis of knee: Secondary | ICD-10-CM | POA: Diagnosis not present

## 2022-06-29 DIAGNOSIS — H53462 Homonymous bilateral field defects, left side: Secondary | ICD-10-CM | POA: Diagnosis not present

## 2022-06-29 DIAGNOSIS — H209 Unspecified iridocyclitis: Secondary | ICD-10-CM | POA: Diagnosis not present

## 2022-06-29 DIAGNOSIS — H4041X3 Glaucoma secondary to eye inflammation, right eye, severe stage: Secondary | ICD-10-CM | POA: Diagnosis not present

## 2022-06-29 DIAGNOSIS — H4042X2 Glaucoma secondary to eye inflammation, left eye, moderate stage: Secondary | ICD-10-CM | POA: Diagnosis not present

## 2022-07-02 DIAGNOSIS — M059 Rheumatoid arthritis with rheumatoid factor, unspecified: Secondary | ICD-10-CM | POA: Diagnosis not present

## 2022-07-03 DIAGNOSIS — N1831 Chronic kidney disease, stage 3a: Secondary | ICD-10-CM | POA: Diagnosis not present

## 2022-07-03 DIAGNOSIS — I2510 Atherosclerotic heart disease of native coronary artery without angina pectoris: Secondary | ICD-10-CM | POA: Diagnosis not present

## 2022-07-03 DIAGNOSIS — N390 Urinary tract infection, site not specified: Secondary | ICD-10-CM | POA: Diagnosis not present

## 2022-07-03 DIAGNOSIS — E2740 Unspecified adrenocortical insufficiency: Secondary | ICD-10-CM | POA: Diagnosis not present

## 2022-07-03 DIAGNOSIS — M069 Rheumatoid arthritis, unspecified: Secondary | ICD-10-CM | POA: Diagnosis not present

## 2022-07-03 DIAGNOSIS — E230 Hypopituitarism: Secondary | ICD-10-CM | POA: Diagnosis not present

## 2022-07-03 DIAGNOSIS — I129 Hypertensive chronic kidney disease with stage 1 through stage 4 chronic kidney disease, or unspecified chronic kidney disease: Secondary | ICD-10-CM | POA: Diagnosis not present

## 2022-07-03 DIAGNOSIS — N184 Chronic kidney disease, stage 4 (severe): Secondary | ICD-10-CM | POA: Diagnosis not present

## 2022-07-11 DIAGNOSIS — M1990 Unspecified osteoarthritis, unspecified site: Secondary | ICD-10-CM | POA: Diagnosis not present

## 2022-07-11 DIAGNOSIS — E669 Obesity, unspecified: Secondary | ICD-10-CM | POA: Diagnosis not present

## 2022-07-11 DIAGNOSIS — M25569 Pain in unspecified knee: Secondary | ICD-10-CM | POA: Diagnosis not present

## 2022-07-11 DIAGNOSIS — Z79899 Other long term (current) drug therapy: Secondary | ICD-10-CM | POA: Diagnosis not present

## 2022-07-11 DIAGNOSIS — L4050 Arthropathic psoriasis, unspecified: Secondary | ICD-10-CM | POA: Diagnosis not present

## 2022-07-11 DIAGNOSIS — M47819 Spondylosis without myelopathy or radiculopathy, site unspecified: Secondary | ICD-10-CM | POA: Diagnosis not present

## 2022-07-11 DIAGNOSIS — M5137 Other intervertebral disc degeneration, lumbosacral region: Secondary | ICD-10-CM | POA: Diagnosis not present

## 2022-07-19 DIAGNOSIS — M1711 Unilateral primary osteoarthritis, right knee: Secondary | ICD-10-CM | POA: Diagnosis not present

## 2022-07-26 DIAGNOSIS — M1711 Unilateral primary osteoarthritis, right knee: Secondary | ICD-10-CM | POA: Diagnosis not present

## 2022-07-31 DIAGNOSIS — H401133 Primary open-angle glaucoma, bilateral, severe stage: Secondary | ICD-10-CM | POA: Diagnosis not present

## 2022-07-31 DIAGNOSIS — H20023 Recurrent acute iridocyclitis, bilateral: Secondary | ICD-10-CM | POA: Diagnosis not present

## 2022-07-31 DIAGNOSIS — H40043 Steroid responder, bilateral: Secondary | ICD-10-CM | POA: Diagnosis not present

## 2022-07-31 DIAGNOSIS — D443 Neoplasm of uncertain behavior of pituitary gland: Secondary | ICD-10-CM | POA: Diagnosis not present

## 2022-08-02 ENCOUNTER — Ambulatory Visit (HOSPITAL_BASED_OUTPATIENT_CLINIC_OR_DEPARTMENT_OTHER)
Admission: RE | Admit: 2022-08-02 | Discharge: 2022-08-02 | Disposition: A | Discharge status: Home / Self Care | Payer: Medicare Other | Source: Ambulatory Visit | Attending: Acute Care | Admitting: Acute Care

## 2022-08-02 DIAGNOSIS — R911 Solitary pulmonary nodule: Secondary | ICD-10-CM

## 2022-08-02 DIAGNOSIS — Z87891 Personal history of nicotine dependence: Secondary | ICD-10-CM | POA: Diagnosis not present

## 2022-08-02 DIAGNOSIS — M1711 Unilateral primary osteoarthritis, right knee: Secondary | ICD-10-CM | POA: Diagnosis not present

## 2022-08-06 ENCOUNTER — Ambulatory Visit: Payer: Medicare Other | Admitting: Neurology

## 2022-08-08 ENCOUNTER — Other Ambulatory Visit: Payer: Self-pay | Admitting: Acute Care

## 2022-08-08 DIAGNOSIS — Z122 Encounter for screening for malignant neoplasm of respiratory organs: Secondary | ICD-10-CM

## 2022-08-08 DIAGNOSIS — Z87891 Personal history of nicotine dependence: Secondary | ICD-10-CM

## 2022-08-14 ENCOUNTER — Ambulatory Visit (INDEPENDENT_AMBULATORY_CARE_PROVIDER_SITE_OTHER): Payer: Medicare Other | Admitting: Family Medicine

## 2022-08-16 ENCOUNTER — Ambulatory Visit (INDEPENDENT_AMBULATORY_CARE_PROVIDER_SITE_OTHER): Payer: Medicare Other | Admitting: *Deleted

## 2022-08-16 DIAGNOSIS — Z0000 Encounter for general adult medical examination without abnormal findings: Secondary | ICD-10-CM | POA: Diagnosis not present

## 2022-08-16 NOTE — Patient Instructions (Signed)
Pamela Lowe , Thank you for taking time to come for your Medicare Wellness Visit. I appreciate your ongoing commitment to your health goals. Please review the following plan we discussed and let me know if I can assist you in the future.   Screening recommendations/referrals: Colonoscopy: up to date Mammogram: up to date Bone Density: up to date Recommended yearly ophthalmology/optometry visit for glaucoma screening and checkup Recommended yearly dental visit for hygiene and checkup  Vaccinations: Influenza vaccine: up to date Pneumococcal vaccine: Education provided Tdap vaccine: up to date Shingles vaccine: up to date    Advanced directives: Education provided   Preventive Care 65 Years and Older, Female Preventive care refers to lifestyle choices and visits with your health care provider that can promote health and wellness. What does preventive care include? A yearly physical exam. This is also called an annual well check. Dental exams once or twice a year. Routine eye exams. Ask your health care provider how often you should have your eyes checked. Personal lifestyle choices, including: Daily care of your teeth and gums. Regular physical activity. Eating a healthy diet. Avoiding tobacco and drug use. Limiting alcohol use. Practicing safe sex. Taking low-dose aspirin every day. Taking vitamin and mineral supplements as recommended by your health care provider. What happens during an annual well check? The services and screenings done by your health care provider during your annual well check will depend on your age, overall health, lifestyle risk factors, and family history of disease. Counseling  Your health care provider may ask you questions about your: Alcohol use. Tobacco use. Drug use. Emotional well-being. Home and relationship well-being. Sexual activity. Eating habits. History of falls. Memory and ability to understand (cognition). Work and work  Astronomer. Reproductive health. Screening  You may have the following tests or measurements: Height, weight, and BMI. Blood pressure. Lipid and cholesterol levels. These may be checked every 5 years, or more frequently if you are over 94 years old. Skin check. Lung cancer screening. You may have this screening every year starting at age 69 if you have a 30-pack-year history of smoking and currently smoke or have quit within the past 15 years. Fecal occult blood test (FOBT) of the stool. You may have this test every year starting at age 10. Flexible sigmoidoscopy or colonoscopy. You may have a sigmoidoscopy every 5 years or a colonoscopy every 10 years starting at age 80. Hepatitis C blood test. Hepatitis B blood test. Sexually transmitted disease (STD) testing. Diabetes screening. This is done by checking your blood sugar (glucose) after you have not eaten for a while (fasting). You may have this done every 1-3 years. Bone density scan. This is done to screen for osteoporosis. You may have this done starting at age 22. Mammogram. This may be done every 1-2 years. Talk to your health care provider about how often you should have regular mammograms. Talk with your health care provider about your test results, treatment options, and if necessary, the need for more tests. Vaccines  Your health care provider may recommend certain vaccines, such as: Influenza vaccine. This is recommended every year. Tetanus, diphtheria, and acellular pertussis (Tdap, Td) vaccine. You may need a Td booster every 10 years. Zoster vaccine. You may need this after age 10. Pneumococcal 13-valent conjugate (PCV13) vaccine. One dose is recommended after age 33. Pneumococcal polysaccharide (PPSV23) vaccine. One dose is recommended after age 24. Talk to your health care provider about which screenings and vaccines you need and how often you  need them. This information is not intended to replace advice given to you by  your health care provider. Make sure you discuss any questions you have with your health care provider. Document Released: 04/01/2015 Document Revised: 11/23/2015 Document Reviewed: 01/04/2015 Elsevier Interactive Patient Education  2017 [employer].  Fall Prevention in the Home Falls can cause injuries. They can happen to people of all ages. There are many things you can do to make your home safe and to help prevent falls. What can I do on the outside of my home? Regularly fix the edges of walkways and driveways and fix any cracks. Remove anything that might make you trip as you walk through a door, such as a raised step or threshold. Trim any bushes or trees on the path to your home. Use bright outdoor lighting. Clear any walking paths of anything that might make someone trip, such as rocks or tools. Regularly check to see if handrails are loose or broken. Make sure that both sides of any steps have handrails. Any raised decks and porches should have guardrails on the edges. Have any leaves, snow, or ice cleared regularly. Use sand or salt on walking paths during winter. Clean up any spills in your garage right away. This includes oil or grease spills. What can I do in the bathroom? Use night lights. Install grab bars by the toilet and in the tub and shower. Do not use towel bars as grab bars. Use non-skid mats or decals in the tub or shower. If you need to sit down in the shower, use a plastic, non-slip stool. Keep the floor dry. Clean up any water that spills on the floor as soon as it happens. Remove soap buildup in the tub or shower regularly. Attach bath mats securely with double-sided non-slip rug tape. Do not have throw rugs and other things on the floor that can make you trip. What can I do in the bedroom? Use night lights. Make sure that you have a light by your bed that is easy to reach. Do not use any sheets or blankets that are too big for your bed. They should not hang  down onto the floor. Have a firm chair that has side arms. You can use this for support while you get dressed. Do not have throw rugs and other things on the floor that can make you trip. What can I do in the kitchen? Clean up any spills right away. Avoid walking on wet floors. Keep items that you use a lot in easy-to-reach places. If you need to reach something above you, use a strong step stool that has a grab bar. Keep electrical cords out of the way. Do not use floor polish or wax that makes floors slippery. If you must use wax, use non-skid floor wax. Do not have throw rugs and other things on the floor that can make you trip. What can I do with my stairs? Do not leave any items on the stairs. Make sure that there are handrails on both sides of the stairs and use them. Fix handrails that are broken or loose. Make sure that handrails are as long as the stairways. Check any carpeting to make sure that it is firmly attached to the stairs. Fix any carpet that is loose or worn. Avoid having throw rugs at the top or bottom of the stairs. If you do have throw rugs, attach them to the floor with carpet tape. Make sure that you have a light switch  at the top of the stairs and the bottom of the stairs. If you do not have them, ask someone to add them for you. What else can I do to help prevent falls? Wear shoes that: Do not have high heels. Have rubber bottoms. Are comfortable and fit you well. Are closed at the toe. Do not wear sandals. If you use a stepladder: Make sure that it is fully opened. Do not climb a closed stepladder. Make sure that both sides of the stepladder are locked into place. Ask someone to hold it for you, if possible. Clearly mark and make sure that you can see: Any grab bars or handrails. First and last steps. Where the edge of each step is. Use tools that help you move around (mobility aids) if they are needed. These  include: Canes. Walkers. Scooters. Crutches. Turn on the lights when you go into a dark area. Replace any light bulbs as soon as they burn out. Set up your furniture so you have a clear path. Avoid moving your furniture around. If any of your floors are uneven, fix them. If there are any pets around you, be aware of where they are. Review your medicines with your doctor. Some medicines can make you feel dizzy. This can increase your chance of falling. Ask your doctor what other things that you can do to help prevent falls. This information is not intended to replace advice given to you by your health care provider. Make sure you discuss any questions you have with your health care provider. Document Released: 12/30/2008 Document Revised: 08/11/2015 Document Reviewed: 04/09/2014 Elsevier Interactive Patient Education  2017 [employer].

## 2022-08-16 NOTE — Progress Notes (Signed)
Subjective:   Pamela Lowe is a 72 y.o. female who presents for Medicare Annual (Subsequent) preventive examination.  I connected with  Santiago Bur on 08/16/22 by a telephone enabled telemedicine application and verified that I am speaking with the correct person using two identifiers.   I discussed the limitations of evaluation and management by telemedicine. The patient expressed understanding and agreed to proceed.  Patient location: home  Provider location: telephone/home    Review of Systems     Cardiac Risk Factors include: advanced age (>25men, >4 women);hypertension;family history of premature cardiovascular disease     Objective:    Today's Vitals   There is no height or weight on file to calculate BMI.     08/16/2022    9:07 AM 01/15/2022    6:28 AM 09/28/2021    1:59 PM 08/03/2021   11:21 AM 11/02/2019    5:07 PM 09/23/2019    1:27 PM 08/02/2019    9:52 PM  Advanced Directives  Does Patient Have a Medical Advance Directive? No No No No No No No  Would patient like information on creating a medical advance directive? No - Patient declined No - Patient declined No - Patient declined No - Patient declined   No - Patient declined    Current Medications (verified) Outpatient Encounter Medications as of 08/16/2022  Medication Sig   AMBULATORY NON FORMULARY MEDICATION Apply 1 drop topically at bedtime. Terbinafine DMSO 3.34% susp   Ascorbic Acid (VITAMIN C) 500 MG CAPS Take 1 tablet by mouth daily.   aspirin 81 MG EC tablet Take 81 mg by mouth every evening.    B Complex Vitamins (VITAMIN B COMPLEX) TABS Take 1 tablet by mouth daily.   benazepril (LOTENSIN) 20 MG tablet Take 1 tablet (20 mg total) by mouth daily.   brimonidine (ALPHAGAN) 0.2 % ophthalmic solution Place 1 drop into both eyes 3 (three) times daily.    clopidogrel (PLAVIX) 75 MG tablet Take 1 tablet (75 mg total) by mouth daily. Please call to schedule an overdue appointment with Dr. Katrinka Blazing for  refills, [phone], thank you. 1st attempt.   dorzolamide-timolol (COSOPT) 22.3-6.8 MG/ML ophthalmic solution Place 1 drop into the right eye 2 (two) times daily.   ezetimibe (ZETIA) 10 MG tablet TAKE 1 TABLET BY MOUTH DAILY   folic acid (FOLVITE) 1 MG tablet Take 1 mg by mouth daily.    hydrocortisone (CORTEF) 10 MG tablet Take 10 mg by mouth daily.   hyoscyamine (LEVSIN SL) 0.125 MG SL tablet DISSOLVE 1 TO 2 TABLETS IN MOUTH UNDER THE TONGUE TWICE DAILY AS NEEDED   methotrexate (RHEUMATREX) 2.5 MG tablet Take by mouth once a week. Mondays Take 6 tablets   metoprolol succinate (TOPROL-XL) 25 MG 24 hr tablet Take 1 tablet (25 mg total) by mouth daily.   Multiple Vitamins-Minerals (MULTIVITAMIN WITH MINERALS) tablet Take 1 tablet by mouth daily.   Omega-3 Fatty Acids (FISH OIL) 1000 MG CAPS Take 1 capsule by mouth daily.    pantoprazole (PROTONIX) 40 MG tablet Take 1 tablet (40 mg total) by mouth daily.   rosuvastatin (CRESTOR) 20 MG tablet TAKE 1 TABLET BY MOUTH DAILY   SIMPONI ARIA 50 MG/4ML SOLN injection Inject 50 mg into the vein. Every 60 Days   No facility-administered encounter medications on file as of 08/16/2022.    Allergies (verified) Sulfamethoxazole-trimethoprim, Conjugated estrogens, and Wellbutrin [bupropion hcl]   History: Past Medical History:  Diagnosis Date   Allergic rhinitis  Allergy    Arthritis    Brain tumor [facility])    Breast cyst 2007   Right   Cataract    surgery 07/2017   Clotting disorder (HCC)    On plavix since stents  for artery blockages   Depression 2006   w/ menopause   Depression screening 09/27/2021   Family history of adverse reaction to anesthesia    sister PONV   Family history of breast cancer    Family history of colon cancer    Family history of kidney cancer 01/15/2018   Family history of lung cancer    Family history of pancreatic cancer    Family history of prostate cancer    Family history of uterine cancer    GERD  (gastroesophageal reflux disease)    Glaucoma    Heart murmur    History of hysterectomy, supracervical    Hyperlipidemia    Hypertension    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   MLH1 [id number] (Splice site)   Multinodular goiter    PAD (peripheral artery disease) (HCC)    LE Art [place] 1/18: Occluded prox-mid R SFA // ABIs 9/22: R 0.83; L 1.0   Pustular psoriasis    Tobacco user    Past Surgical History:  Procedure Laterality Date   ABDOMINAL HYSTERECTOMY  1995   BIOPSY  01/15/2022   Procedure: BIOPSY;  Surgeon: Lemar Lofty., MD;  Location: [facility];  Service: Gastroenterology;;   BRAIN TUMOR EXCISION  02/24/2021   at [facility]   BREAST BIOPSY Bilateral 1985   CARDIAC CATHETERIZATION     CATARACT EXTRACTION Right 07/2017   COLONOSCOPY     COLONOSCOPY WITH PROPOFOL N/A 01/15/2022   Procedure: COLONOSCOPY WITH PROPOFOL;  Surgeon: Lemar Lofty., MD;  Location: [facility];  Service: Gastroenterology;  Laterality: N/A;   CORONARY STENT INTERVENTION N/A 12/26/2018   Procedure: CORONARY STENT INTERVENTION;  Surgeon: Lyn Records, MD;  Location: [facility];  Service: Cardiovascular;  Laterality: N/A;   ESOPHAGOGASTRODUODENOSCOPY (EGD) WITH PROPOFOL N/A 01/15/2022   Procedure: ESOPHAGOGASTRODUODENOSCOPY (EGD) WITH PROPOFOL;  Surgeon: Meridee Score Netty Starring., MD;  Location: [facility];  Service: Gastroenterology;  Laterality: N/A;   EYE SURGERY  2019, 2020   Cataracts, eye shunt implants   LEFT HEART CATH AND CORONARY ANGIOGRAPHY N/A 12/17/2018   Procedure: LEFT HEART CATH AND CORONARY ANGIOGRAPHY;  Surgeon: Lyn Records, MD;  Location: [facility] CV [facility];  Service: Cardiovascular;  Laterality: N/A;   POLYPECTOMY  01/15/2022   Procedure: POLYPECTOMY;  Surgeon: Meridee Score Netty Starring., MD;  Location: [facility];  Service: Gastroenterology;;   SUPRACERVICAL ABDOMINAL HYSTERECTOMY  1986   h/o supracervical hysterectomy   UPPER GASTROINTESTINAL  ENDOSCOPY     Family History  Problem Relation Age of Onset   Hypertension Mother    Deep vein thrombosis Mother    Alzheimer's disease Mother    Osteopenia Mother    Arthritis Mother    Varicose Veins Mother    Kidney cancer Father 53   Pneumonia Father    Hypertension Father    Arthritis Father    Early death Father    Kidney disease Father    Breast cancer Sister 41       recurrence 36   Cancer Sister    Breast cancer Sister 22       Stage 0   Cancer Sister    Breast cancer Sister 90       Stage 3, bilateral mastectomy   Cancer Sister  Early death Sister    Deep vein thrombosis Sister            Allergies Brother    Early death Brother    Heart attack Brother 34   Breast cancer Maternal Aunt 67   Prostate cancer Maternal Uncle 25   Lung cancer Maternal Uncle 60   Pancreatic cancer Paternal Aunt 49   Pancreatic cancer Paternal Aunt 100   Brain cancer Paternal Aunt    Pancreatic cancer Paternal Uncle 77   Lung cancer Paternal Uncle 34   Heart attack Maternal Grandfather    Uterine cancer Paternal Grandmother 52   Colon cancer Paternal Grandfather 88   Kidney cancer Cousin 59   Kidney cancer Cousin 52   Uterine cancer Cousin 62   Prostate cancer Cousin 57   Breast cancer Cousin 60   Thyroid disease Neg Hx    Rectal cancer Neg Hx    Stomach cancer Neg Hx    Esophageal cancer Neg Hx    Inflammatory bowel disease Neg Hx    Liver disease Neg Hx    Social History   Socioeconomic History   Marital status: Married    Spouse name: Tasia Catchings   Number of children: 0   Years of education: Not on file   Highest education level: Master's degree (e.g., MA, MS, MEng, MEd, MSW, MBA)  Occupational History   Occupation: Special Ed Runner, broadcasting/film/video    Comment: Dentist  Tobacco Use   Smoking status: Former    Packs/day: 0.25    Years: 36.00    Additional pack years: 0.00    Total pack years: 9.00    Types: Cigarettes    Quit date: 08/01/2017    Years since quitting:  5.0   Smokeless tobacco: Never   Tobacco comments:    Started smoking at about 72 yrs old  Vaping Use   Vaping Use: Never used  Substance and Sexual Activity   Alcohol use: Not Currently   Drug use: No   Sexual activity: Not Currently    Partners: Male    Birth control/protection: Post-menopausal, Surgical    Comment: Hysterectomy  Other Topics Concern   Not on file  Social History Narrative   Not on file   Social Determinants of Health   Financial Resource Strain: Low Risk  (08/16/2022)   Overall Financial Resource Strain (CARDIA)    Difficulty of Paying Living Expenses: Not hard at all  Food Insecurity: No Food Insecurity (08/16/2022)   Hunger Vital Sign    Worried About Running Out of Food in the Last Year: Never true    Ran Out of Food in the Last Year: Never true  Transportation Needs: No Transportation Needs (08/16/2022)   PRAPARE - Administrator, Civil Service (Medical): No    Lack of Transportation (Non-Medical): No  Physical Activity: Sufficiently Active (08/03/2021)   Exercise Vital Sign    Days of Exercise per Week: 6 days    Minutes of Exercise per Session: 30 min  Stress: No Stress Concern Present (08/16/2022)   [employer] of Occupational Health - Occupational Stress Questionnaire    Feeling of Stress : Not at all  Social Connections: Moderately Integrated (08/16/2022)   Social Connection and Isolation Panel [NHANES]    Frequency of Communication with Friends and Family: Three times a week    Frequency of Social Gatherings with Friends and Family: Once a week    Attends Religious Services: Never    Active Member of  Clubs or Organizations: Yes    Attends Engineer, structural: More than 4 times per year    Marital Status: Married    Tobacco Counseling Counseling given: Not Answered Tobacco comments: Started smoking at about 72 yrs old   Clinical Intake:  Pre-visit preparation completed: Yes  Pain : No/denies pain      Diabetes: No  How often do you need to have someone help you when you read instructions, pamphlets, or other written materials from your doctor or pharmacy?: 1 - Never  Diabetic?  no  Interpreter Needed?: No  Information entered by :: Remi Haggard LPN   Activities of Daily Living    08/16/2022    9:06 AM 08/16/2022    9:05 AM  In your present state of health, do you have any difficulty performing the following activities:  Hearing? 0 0  Vision? 0 0  Difficulty concentrating or making decisions? 0 0  Walking or climbing stairs? 1   Dressing or bathing? 0   Doing errands, shopping? 0   Preparing Food and eating ? N   In the past six months, have you accidently leaked urine? Y   Do you have problems with loss of bowel control? N   Managing your Medications? N   Managing your Finances? N   Housekeeping or managing your Housekeeping? N     Patient Care Team: Shade Flood, MD as PCP - General (Family Medicine) Lyn Records, MD (Inactive) as PCP - Cardiology (Cardiology) Lyn Records, MD (Inactive) as Consulting Physician (Cardiology) Rudie Meyer, MD as Referring Physician (Ophthalmology) Loraine Grip Harrietta Guardian, MD as Referring Physician (Ophthalmology) Epimenio Foot, Pearletha Furl, MD (Neurology) Rachael Fee, MD as Attending Physician (Gastroenterology) Diona Foley, MD as Consulting Physician (Ophthalmology) Rossie Muskrat, MD (Rheumatology) Dorisann Frames, MD as Referring Physician (Endocrinology) Jerene Bears, MD as Consulting Physician (Obstetrics and Gynecology) Edythe Lynn, Rayna Sexton, MD as Referring Physician (Otolaryngology) Marliss Czar, MD (Neurosurgery)  Indicate any recent Medical Services you may have received from other than [facility] providers in the past year (date may be approximate).     Assessment:   This is a routine wellness examination for [place].  Hearing/Vision screen Hearing Screening - Comments:: No trouble hearing Vision Screening -  Comments:: Has three eye doctor Hecker Glaucoma, some vision loss  Dietary issues and exercise activities discussed: Current Exercise Habits: Home exercise routine, Type of exercise: walking (stationary bike), Time (Minutes): 40, Frequency (Times/Week): 4, Weekly Exercise (Minutes/Week): 160, Intensity: Mild, Exercise limited by: orthopedic condition(s)   Goals Addressed             This Visit's Progress    Weight (lb) < 200 lb (90.7 kg)         Depression Screen    08/16/2022    9:13 AM 05/21/2022    2:17 PM 04/09/2022    8:05 AM 11/22/2021    9:45 AM 10/27/2021    9:43 AM 09/27/2021    7:03 AM 09/14/2021    1:33 PM  PHQ 2/9 Scores  PHQ - 2 Score 0 0 0 0 0 4 0  PHQ- 9 Score 0 0 0 0 2 5 0    Fall Risk    08/16/2022    9:07 AM 08/15/2022   12:15 PM 05/21/2022    2:17 PM 04/09/2022    8:05 AM 11/22/2021    9:46 AM  Fall Risk   Falls in the past year? 0 0 0 0 0  Number falls in past yr: 0 0 0 0   Injury with Fall? 0  0 0   Risk for fall due to :   No Fall Risks No Fall Risks No Fall Risks  Follow up Falls evaluation completed;Education provided;Falls prevention discussed  Falls evaluation completed Falls evaluation completed Falls evaluation completed    FALL RISK PREVENTION PERTAINING TO THE HOME:  Any stairs in or around the home? Yes  If so, are there any without handrails? No  Home free of loose throw rugs in walkways, pet beds, electrical cords, etc? Yes  Adequate lighting in your home to reduce risk of falls? Yes   ASSISTIVE DEVICES UTILIZED TO PREVENT FALLS:  Life alert? No  Use of a cane, walker or w/c? No  Grab bars in the bathroom? Yes  Shower chair or bench in shower? No  Elevated toilet seat or a handicapped toilet? Yes   TIMED UP AND GO:  Was the test performed? No .    Cognitive Function:      08/10/2021   10:59 AM 09/21/2016   10:41 AM  Montreal Cognitive Assessment   Visuospatial/ Executive (0/5) 4 5  Naming (0/3) 3 3  Attention: Read list of  digits (0/2) 1 2  Attention: Read list of letters (0/1) 1 1  Attention: Serial 7 subtraction starting at 100 (0/3) 2 3  Language: Repeat phrase (0/2) 2 2  Language : Fluency (0/1) 1 1  Abstraction (0/2) 2 2  Delayed Recall (0/5) 2 5  Orientation (0/6) 5 6  Total 23 30      08/16/2022    9:10 AM  6CIT Screen  What Year? 0 points  What month? 0 points  What time? 0 points  Count back from 20 0 points  Months in reverse 0 points  Repeat phrase 0 points  Total Score 0 points    Immunizations Immunization History  Administered Date(s) Administered   Fluad Quad(high Dose 65+) 12/08/2021   Influenza, High Dose Seasonal PF 01/08/2020   Influenza-Unspecified 12/13/2017, 12/05/2020   PFIZER Comirnaty(Gray Top)Covid-19 Tri-Sucrose Vaccine 08/01/2020   PFIZER(Purple Top)SARS-COV-2 Vaccination 04/24/2019, 05/19/2019, 12/01/2019   Pfizer Covid-19 Vaccine Bivalent Booster 56yrs & up 01/27/2021   Pneumococcal Conjugate-13 01/08/2020   Tdap 03/19/2006, 11/21/2018   Zoster Recombinat (Shingrix) 12/03/2018, 02/06/2019    TDAP status: Up to date  Flu Vaccine status: Up to date  Pneumococcal vaccine status: Up to date  Covid-19 vaccine status: Information provided on how to obtain vaccines.   Qualifies for Shingles Vaccine? No   Zostavax completed No   Shingrix Completed?: Yes  Screening Tests Health Maintenance  Topic Date Due   INFLUENZA VACCINE  10/18/2022   Colonoscopy  01/16/2023   Medicare Annual Wellness (AWV)  08/16/2023   MAMMOGRAM  05/29/2024   DTaP/Tdap/Td (3 - Td or Tdap) 11/20/2028   DEXA SCAN  Completed   Hepatitis C Screening  Completed   Zoster Vaccines- Shingrix  Completed   HPV VACCINES  Aged Out   Pneumonia Vaccine 40+ Years old  Discontinued   COVID-19 Vaccine  Discontinued    Health Maintenance  There are no preventive care reminders to display for this patient.   Colorectal cancer screening: Type of screening: Colonoscopy. Completed 2023. Repeat  every 1 years  Mammogram status: Completed  . Repeat every year  Bone Density status: Completed 2021. Results reflect: Bone density results: NORMAL. Repeat every 3-5 years.  Lung Cancer Screening: (Low Dose CT Chest recommended if Age 48-80 years,  30 pack-year currently smoking OR have quit w/in 15years.) does qualify.   Lung Cancer Screening Referral: completed  Additional Screening:  Hepatitis C Screening: does not qualify; Completed 2021  Vision Screening: Recommended annual ophthalmology exams for early detection of glaucoma and other disorders of the eye. Is the patient up to date with their annual eye exam?  Yes  Who is the provider or what is the name of the office in which the patient attends annual eye exams? [facility] If pt is not established with a provider, would they like to be referred to a provider to establish care? No .   Dental Screening: Recommended annual dental exams for proper oral hygiene  Community Resource Referral / Chronic Care Management: CRR required this visit?  No   CCM required this visit?  No      Plan:     I have personally reviewed and noted the following in the patient's chart:   Medical and social history Use of alcohol, tobacco or illicit drugs  Current medications and supplements including opioid prescriptions. Patient is not currently taking opioid prescriptions. Functional ability and status Nutritional status Physical activity Advanced directives List of other physicians Hospitalizations, surgeries, and ER visits in previous 12 months Vitals Screenings to include cognitive, depression, and falls Referrals and appointments  In addition, I have reviewed and discussed with patient certain preventive protocols, quality metrics, and best practice recommendations. A written personalized care plan for preventive services as well as general preventive health recommendations were provided to patient.     Remi Haggard, LPN   1/61/0960    Nurse Notes:

## 2022-08-27 DIAGNOSIS — M059 Rheumatoid arthritis with rheumatoid factor, unspecified: Secondary | ICD-10-CM | POA: Diagnosis not present

## 2022-08-29 DIAGNOSIS — H401133 Primary open-angle glaucoma, bilateral, severe stage: Secondary | ICD-10-CM | POA: Diagnosis not present

## 2022-08-30 LAB — LAB REPORT - SCANNED
EGFR: 67
HM Hepatitis Screen: NEGATIVE

## 2022-09-05 ENCOUNTER — Ambulatory Visit (INDEPENDENT_AMBULATORY_CARE_PROVIDER_SITE_OTHER): Payer: Medicare Other | Admitting: Family Medicine

## 2022-09-05 ENCOUNTER — Encounter (INDEPENDENT_AMBULATORY_CARE_PROVIDER_SITE_OTHER): Payer: Self-pay | Admitting: Family Medicine

## 2022-09-05 VITALS — BP 108/72 | HR 68 | Temp 98.1°F | Ht 66.0 in | Wt 197.0 lb

## 2022-09-05 DIAGNOSIS — R7303 Prediabetes: Secondary | ICD-10-CM | POA: Diagnosis not present

## 2022-09-05 DIAGNOSIS — Z6831 Body mass index (BMI) 31.0-31.9, adult: Secondary | ICD-10-CM

## 2022-09-05 DIAGNOSIS — I2510 Atherosclerotic heart disease of native coronary artery without angina pectoris: Secondary | ICD-10-CM | POA: Diagnosis not present

## 2022-09-05 DIAGNOSIS — E669 Obesity, unspecified: Secondary | ICD-10-CM | POA: Diagnosis not present

## 2022-09-05 NOTE — Assessment & Plan Note (Signed)
Reviewed patient's overall progress, down 16 lb in the past 11 mos of medically supervised weight management.  This is a 7.5% TBW loss with lifestyle change intervention. She is gaining muscle mass and down in body fat despite travel and social celebrations She is logging intake most days, getting 800-100 cal/ day (lower than target) but is making a point to get in lean protein, fruits and veggies without meal skipping.  She asks about taking something for appetite control due to hunger late in the day.  We reviewed her plan and her last IC result.  I moved her calorie target to 1200 per day to include ~80 g of protein intake daily.  She can have up to 2 fruit servings per day and plenty of veggies.  Handout on Mediterranean diet given.  Her hunger likely due to undereating.  She is not a good candidate for use of anti obesity medication given risk/ benefit as reviewed today.  She has room to increase regular exercise frequency.

## 2022-09-05 NOTE — Assessment & Plan Note (Signed)
Lab Results  Component Value Date   HGBA1C 5.9 04/09/2022   She has been working on reducing her intake of starches and sweets, adding more regular exercise and has lost 7.5% TBW in the past 11 mos.  Continue healthy lifestyle changes. Recheck A1c in 2 mos

## 2022-09-05 NOTE — Progress Notes (Signed)
Office: [phone]  /  Fax: [phone]  Pamela SUMMARY AND BIOMETRICS  Starting Date: 09/27/21  Starting Pamela: 213lb   Pamela Lost Since Last Visit: 0lb   Vitals Temp: 98.1 F (36.7 C) BP: 108/72 Pulse Rate: 68 SpO2: 96 %   Body Composition  Body Fat %: 40.4 % Fat Mass (lbs): 79.8 lbs Muscle Mass (lbs): 111.6 lbs Total Body Water (lbs): 81.6 lbs Visceral Fat Rating : 12     HPI  Chief Complaint: OBESITY  Pamela Lowe is here to discuss her progress with her obesity treatment plan. She is on the the Category 1 Plan and states she is following her eating plan approximately 50 % of the time. She states she is exercising 30-60 minutes 3 times per week.   Interval History:  Since last office visit she is up 4 lb  She gained 5.2 lb of muscle mass and lost 1.4 lb of body fat in the past 3 mos She has lost 16 lb in 11 mos of medically supervised Pamela management. She has been to celebrations and had travel She cancelled April and May visits She has indulged in more desserts but is still eating on a schedule and gets protein with her meals She has ramped up the resistance on exercise bike She is off of Topamax due to rise in Cr and BP She is motivated to lose about 10-15 more pounds When logging, she has been getting [phone] cal/ day noting hunger in the afternoon   Pharmacotherapy: none  PHYSICAL EXAM:  Blood pressure 108/72, pulse 68, temperature 98.1 F (36.7 C), height 5\' 6"  (1.676 m), Pamela 197 lb (89.4 kg), last menstrual period 03/19/1998, SpO2 96 %. Body mass index is 31.8 kg/m.  General: She is overweight, cooperative, alert, well developed, and in no acute distress. PSYCH: Has normal mood, affect and thought process.   Lungs: Normal breathing effort, no conversational dyspnea.   ASSESSMENT AND PLAN  TREATMENT PLAN FOR OBESITY:  Recommended Dietary Goals  Pamela Lowe is currently in the action stage of change. As such, her goal is to continue  Pamela management plan. She has agreed to following a lower carbohydrate, vegetable and lean protein rich diet plan. Mediterranean Diet handout given Log daily calories, increasing to 1200 cal/ day to include 75-80 g of protein daily  Behavioral Intervention  We discussed the following Behavioral Modification Strategies today: increasing lean protein intake, decreasing simple carbohydrates , increasing vegetables, increasing lower glycemic fruits, increasing fiber rich foods, increasing water intake, keeping healthy foods at home, continue to practice mindfulness when eating, and planning for success.  Additional resources provided today: NA  Recommended Physical Activity Goals  Pamela Lowe, Pamela loss maintenance and preservation of muscle mass.   She has agreed to Increase the intensity, frequency or duration of strengthening exercises   Pharmacotherapy changes for the treatment of obesity: none  ASSOCIATED CONDITIONS ADDRESSED TODAY  Pre-diabetes Assessment & Plan: Lab Results  Component Value Date   HGBA1C 5.9 04/09/2022   She has been working on reducing her intake of starches and sweets, adding more regular exercise and has lost 7.5% TBW in the past 11 mos.  Continue healthy lifestyle changes. Recheck A1c in 2 mos   Generalized obesity wtih starting BMI 34 Assessment & Plan: Reviewed patient's overall progress, down 16 lb in the past 11 mos of medically  supervised Pamela management.  This is a 7.5% TBW loss with lifestyle change intervention. She is gaining muscle mass and down in body fat despite travel and social celebrations She is logging intake most days, getting 800-100 cal/ day (lower than target) but is making a point to get in lean protein, fruits and veggies without meal skipping.  She asks about taking  something for appetite control due to hunger late in the day.  We reviewed her plan and her last IC result.  I moved her calorie target to 1200 per day to include ~80 g of protein intake daily.  She can have up to 2 fruit servings per day and plenty of veggies.  Handout on Mediterranean diet given.  Her hunger likely due to undereating.  She is not a good candidate for use of anti obesity medication given risk/ benefit as reviewed today.  She has room to increase regular exercise frequency.   BMI 31.0-31.9,adult  Coronary artery disease involving native coronary artery of native heart without angina pectoris Assessment & Plan: Pt denies issues with anginal chest pain during exercise.  She is taking rosuvastatin 20 mg daily, toprol XL 25 mg daily, Benazepril 20 mg daily and ASA 81 mg daily.  Last cardiac visit done with Wallis Bamberg FNP 03/31/22, notes reviewed.  She is s/p stenting done Oct 2020.  She has been able to increase her time on exercise bike, adding in resistance without problem.  Continue current medications/ follow up with cardiology Consider use of Wegovy for cardiac indication in the future if needed       She was informed of the importance of frequent follow up visits to maximize her success with intensive lifestyle modifications for her multiple Lowe conditions.   ATTESTASTION STATEMENTS:  Reviewed by clinician on day of visit: allergies, medications, problem list, medical history, surgical history, family history, social history, and previous encounter notes pertinent to obesity diagnosis.   I have personally spent 30 minutes total time today in preparation, patient care, nutritional counseling and documentation for this visit, including the following: review of clinical lab tests; review of medical tests/procedures/services.      Glennis Brink, DO DABFM, DABOM [facility] [address] [place], [place] [address] [phone]

## 2022-09-05 NOTE — Assessment & Plan Note (Signed)
Pt denies issues with anginal chest pain during exercise.  She is taking rosuvastatin 20 mg daily, toprol XL 25 mg daily, Benazepril 20 mg daily and ASA 81 mg daily.  Last cardiac visit done with Wallis Bamberg FNP 03/31/22, notes reviewed.  She is s/p stenting done Oct 2020.  She has been able to increase her time on exercise bike, adding in resistance without problem.  Continue current medications/ follow up with cardiology Consider use of Wegovy for cardiac indication in the future if needed

## 2022-09-11 ENCOUNTER — Other Ambulatory Visit: Payer: Self-pay | Admitting: *Deleted

## 2022-09-11 MED ORDER — CLOPIDOGREL BISULFATE 75 MG PO TABS: 75.0000 mg | ORAL_TABLET | Freq: Every day | ORAL | 1 refills | Status: DC

## 2022-09-28 ENCOUNTER — Other Ambulatory Visit: Payer: Self-pay | Admitting: *Deleted

## 2022-09-28 MED ORDER — EZETIMIBE 10 MG PO TABS: 10.0000 mg | ORAL_TABLET | Freq: Every day | ORAL | 0 refills | Status: DC

## 2022-09-28 MED ORDER — ROSUVASTATIN CALCIUM 20 MG PO TABS: 20.0000 mg | ORAL_TABLET | Freq: Every day | ORAL | 0 refills | Status: DC

## 2022-10-04 ENCOUNTER — Ambulatory Visit (INDEPENDENT_AMBULATORY_CARE_PROVIDER_SITE_OTHER): Payer: Medicare Other | Admitting: Family Medicine

## 2022-10-04 ENCOUNTER — Telehealth (INDEPENDENT_AMBULATORY_CARE_PROVIDER_SITE_OTHER): Payer: Self-pay | Admitting: Family Medicine

## 2022-10-04 ENCOUNTER — Encounter (INDEPENDENT_AMBULATORY_CARE_PROVIDER_SITE_OTHER): Payer: Self-pay | Admitting: Family Medicine

## 2022-10-04 VITALS — BP 115/74 | HR 64 | Temp 97.8°F | Ht 66.0 in | Wt 199.0 lb

## 2022-10-04 DIAGNOSIS — I2510 Atherosclerotic heart disease of native coronary artery without angina pectoris: Secondary | ICD-10-CM | POA: Diagnosis not present

## 2022-10-04 DIAGNOSIS — E669 Obesity, unspecified: Secondary | ICD-10-CM

## 2022-10-04 DIAGNOSIS — R7303 Prediabetes: Secondary | ICD-10-CM | POA: Diagnosis not present

## 2022-10-04 DIAGNOSIS — E559 Vitamin D deficiency, unspecified: Secondary | ICD-10-CM

## 2022-10-04 DIAGNOSIS — Z6832 Body mass index (BMI) 32.0-32.9, adult: Secondary | ICD-10-CM

## 2022-10-04 MED ORDER — WEGOVY 0.25 MG/0.5ML ~~LOC~~ SOAJ: 0.2500 mg | SUBCUTANEOUS | 0 refills | Status: DC

## 2022-10-04 NOTE — Progress Notes (Signed)
Office: [phone]  /  Fax: [phone]  WEIGHT SUMMARY AND BIOMETRICS  Starting Date: 09/27/21  Starting Weight: 213lb   Weight Lost Since Last Visit: 0lb   Vitals Temp: 97.8 F (36.6 C) BP: 115/74 Pulse Rate: 64 SpO2: 100 %   Body Composition  Body Fat %: 41.8 % Fat Mass (lbs): 83.4 lbs Muscle Mass (lbs): 110.4 lbs Total Body Water (lbs): 81.6 lbs Visceral Fat Rating : 12     HPI  Chief Complaint: OBESITY  Pamela Lowe is here to discuss her progress with her obesity treatment plan. She is on the the Category 1 Plan and states she is following her eating plan approximately 80 % of the time. She states she is exercising 30 minutes 3 times per week.   Interval History:  Since last office visit she is up 2 lb She has changed to the Mediterranean diet but feels like she may be overeating She got a Mediterranean diet cookbook and is cooking at home more She has a net weight loss of 14 lb in the past year Her husband is supportive  Muscle mass is down 1.2 lb and is up 2.6 lb of body fat She is using an exercise bike 3 x a week and plans to add in yoga She hasn't been doing [employer] workout classes due to a busy schedule Energy level has improved and started a B complex vitamin   Pharmacotherapy: none  PHYSICAL EXAM:  Blood pressure 115/74, pulse 64, temperature 97.8 F (36.6 C), height 5\' 6"  (1.676 m), weight 199 lb (90.3 kg), last menstrual period 03/19/1998, SpO2 100%. Body mass index is 32.12 kg/m.  General: She is overweight, cooperative, alert, well developed, and in no acute distress. PSYCH: Has normal mood, affect and thought process.   Lungs: Normal breathing effort, no conversational dyspnea.   ASSESSMENT AND PLAN  TREATMENT PLAN FOR OBESITY:  Recommended Dietary Goals  Pamela Lowe is currently in the action stage of change. As such, her goal is to continue weight management plan. She has agreed to following a lower carbohydrate, vegetable and lean  protein rich diet plan. On Mediterranean Diet  Behavioral Intervention  We discussed the following Behavioral Modification Strategies today: increasing lean protein intake, decreasing simple carbohydrates , increasing vegetables, increasing lower glycemic fruits, increasing water intake, work on meal planning and preparation, keeping healthy foods at home, avoiding temptations and identifying enticing environmental cues, continue to practice mindfulness when eating, planning for success, and better snacking choices.  Additional resources provided today: NA  Recommended Physical Activity Goals  Pamela Lowe has been advised to work up to 150 minutes of moderate intensity aerobic activity a week and strengthening exercises 2-3 times per week for cardiovascular health, weight loss maintenance and preservation of muscle mass.   She has agreed to [employer] strengthening exercises with a goal of 2-3 sessions a week  and Increase the intensity, frequency or duration of aerobic exercises    Pharmacotherapy changes for the treatment of obesity: Wegovy 0.25 mg once weekly injection for CAD indication  ASSOCIATED CONDITIONS ADDRESSED TODAY  Coronary artery disease involving native coronary artery of native heart without angina pectoris Assessment & Plan: Patient has stable CAD, managed on metoprolol XL 25 mg once daily, benazepril 20 mg once daily and aspirin 81 mg daily.  She denies chest pain or dyspnea on exertion.  She is working on a Mediterranean style diet with handouts provided last visit.  She plans to increase exercise frequency to 3-4 times a week.  She  is a good candidate for use of Wegovy 0.25 mg once weekly injection for CAD indication. Patient denies a personal or family history of pancreatitis, medullary thyroid carcinoma or multiple endocrine neoplasia type II. Recommend reviewing pen training video online.   Orders: -     Wegovy; Inject 0.25 mg into the skin once a week.  Dispense: 2 mL;  Refill: 0  Generalized obesity wtih starting BMI 34  BMI 32.0-32.9,adult  Pre-diabetes Assessment & Plan: Last A1c 5.9 04/09/2022.  She is currently working on reducing her intake of added sugar and refined carbohydrates.  She is doing cardio exercises 30 minutes 2-3 times a week with plans to increase frequency.  She has been resistant to use of metformin.  Continue working on a reduced calorie Mediterranean style diet, low in added sugar and refined carbohydrates while focusing on lean protein and fiber with meals.  Increase exercise frequency to 30 minutes 5 days a week.      Vitamin D deficiency Assessment & Plan: Last vitamin D Lab Results  Component Value Date   VD25OH 30.3 09/27/2021   Her last vitamin D level was below goal of 50-70.  Advised her to take over-the-counter vitamin D 4000 IU once daily.  Bone density testing due every 2 years       She was informed of the importance of frequent follow up visits to maximize her success with intensive lifestyle modifications for her multiple health conditions.   ATTESTASTION STATEMENTS:  Reviewed by clinician on day of visit: allergies, medications, problem list, medical history, surgical history, family history, social history, and previous encounter notes pertinent to obesity diagnosis.   I have personally spent 30 minutes total time today in preparation, patient care, nutritional counseling and documentation for this visit, including the following: review of clinical lab tests; review of medical tests/procedures/services.      Glennis Brink, DO DABFM, DABOM [facility] [address] [place], [place] [address] [phone]

## 2022-10-04 NOTE — Assessment & Plan Note (Signed)
Patient has stable CAD, managed on metoprolol XL 25 mg once daily, benazepril 20 mg once daily and aspirin 81 mg daily.  She denies chest pain or dyspnea on exertion.  She is working on a Mediterranean style diet with handouts provided last visit.  She plans to increase exercise frequency to 3-4 times a week.  She is a good candidate for use of Wegovy 0.25 mg once weekly injection for CAD indication. Patient denies a personal or family history of pancreatitis, medullary thyroid carcinoma or multiple endocrine neoplasia type II. Recommend reviewing pen training video online.

## 2022-10-04 NOTE — Assessment & Plan Note (Signed)
Last vitamin D Lab Results  Component Value Date   VD25OH 30.3 09/27/2021   Her last vitamin D level was below goal of 50-70.  Advised her to take over-the-counter vitamin D 4000 IU once daily.  Bone density testing due every 2 years

## 2022-10-04 NOTE — Telephone Encounter (Signed)
Prior authorization for patients Wegovy done via cover my meds. Waiting on determination.

## 2022-10-04 NOTE — Assessment & Plan Note (Signed)
Last A1c 5.9 04/09/2022.  She is currently working on reducing her intake of added sugar and refined carbohydrates.  She is doing cardio exercises 30 minutes 2-3 times a week with plans to increase frequency.  She has been resistant to use of metformin.  Continue working on a reduced calorie Mediterranean style diet, low in added sugar and refined carbohydrates while focusing on lean protein and fiber with meals.  Increase exercise frequency to 30 minutes 5 days a week.

## 2022-10-08 ENCOUNTER — Encounter: Payer: Self-pay | Admitting: Family Medicine

## 2022-10-08 ENCOUNTER — Ambulatory Visit (INDEPENDENT_AMBULATORY_CARE_PROVIDER_SITE_OTHER): Payer: Medicare Other | Admitting: Family Medicine

## 2022-10-08 VITALS — BP 130/80 | HR 79 | Temp 98.3°F | Resp 18 | Ht 66.0 in | Wt 204.1 lb

## 2022-10-08 DIAGNOSIS — S00412A Abrasion of left ear, initial encounter: Secondary | ICD-10-CM

## 2022-10-08 DIAGNOSIS — R7303 Prediabetes: Secondary | ICD-10-CM

## 2022-10-08 DIAGNOSIS — E782 Mixed hyperlipidemia: Secondary | ICD-10-CM

## 2022-10-08 DIAGNOSIS — I2510 Atherosclerotic heart disease of native coronary artery without angina pectoris: Secondary | ICD-10-CM

## 2022-10-08 DIAGNOSIS — I10 Essential (primary) hypertension: Secondary | ICD-10-CM | POA: Diagnosis not present

## 2022-10-08 NOTE — Progress Notes (Signed)
Subjective:  Patient ID: Pamela Lowe, female    DOB: Jan 10, 1951  Age: 72 y.o. MRN: [id number]  CC:  Chief Complaint  Patient presents with   Follow-up    6 month follow up HTN  Doing well per pt  Would like her left ear checked noticed dry blood in it this morning     HPI Pamela Lowe presents for   Hyperlipidemia: Treated with Crestor, Zetia.  History of CAD, PAD, followed by cardiology - new cardiologist in next few months. Has cut back on omega 3, prior bruising concern stopped.  No new CP/dyspnea with activity.  Lab Results  Component Value Date   CHOL 143 04/09/2022   HDL 69.90 04/09/2022   LDLCALC 56 04/09/2022   TRIG 87.0 04/09/2022   CHOLHDL 2 04/09/2022   Lab Results  Component Value Date   ALT 31 04/09/2022   AST 36 04/09/2022   ALKPHOS 55 04/09/2022   BILITOT 0.9 04/09/2022   Hypertension: Treated with Toprol-XL 25 mg daily, Zestril 10 mg daily and previously hydrochlorothiazide, few days per week when discussed in January.  Rheumatology for rheumatoid arthritis and hydrocortisone for adrenal sufficiency status post meningioma removal.  Followed by endocrinology with history of goiter. Noted to have possible increased echogenicity of right renal cortex on previous ultrasound.  Referred to nephrology.Appointment with [facility] April 16.  CKD stage IIIa, presumably nephrosclerosis due to hypertension age, atherosclerosis plus or minus methotrexate.  Plan to continue to follow. Off the hydrochlorothiazide. Home readings: usually 120/70 range.no new med side effects.  BP Readings from Last 3 Encounters:  10/08/22 130/80  10/04/22 115/74  09/05/22 108/72   Lab Results  Component Value Date   CREATININE 1.12 04/09/2022   Prediabetes: Diet/exercise approach and plan for follow-up with weight management at her January visit, on Topamax.  Recent visit July 18 with Dr. Cathey Endow, started on Wegovy, with CAD indication  - not covered by  insurance.  Lab Results  Component Value Date   HGBA1C 5.9 04/09/2022   Wt Readings from Last 3 Encounters:  10/08/22 204 lb 2 oz (92.6 kg)  10/04/22 199 lb (90.3 kg)  09/05/22 197 lb (89.4 kg)   Left ear irritation/bleeding Noticed dried blood this morning in left ear canal. Dried blood on q tip this morning. Cleaning a little harder last week. No known injury.  No hearing change, no pain. No d/c.   History Patient Active Problem List   Diagnosis Date Noted   Generalized obesity wtih starting BMI 34 09/05/2022   Pre-diabetes 04/25/2022   Pyrosis 03/30/2022   Bloating 03/30/2022   Abdominal pain, epigastric 03/30/2022   Polyphagia 03/22/2022   Eating disorder 12/27/2021   Osteoarthritis 09/28/2021   Degeneration of lumbosacral intervertebral disc 09/28/2021   Elevated ALT measurement 09/28/2021   Other fatigue 09/27/2021   SOB (shortness of breath) on exertion 09/27/2021   Elevated glucose 09/27/2021   Health care maintenance 09/27/2021   Vitamin D deficiency 09/27/2021   Depression screening 09/27/2021   Spondylosis without myelopathy 08/03/2021   Rheumatoid arthritis with rheumatoid factor (HCC) 08/03/2021   Psoriasis 08/03/2021   Hypopituitarism (HCC) 08/03/2021   Adrenal insufficiency (HCC) 08/03/2021   Gastroesophageal reflux disease 07/26/2021   Meningioma (HCC) 03/21/2021   Visual field defect 03/21/2021   Chronic left shoulder pain 03/21/2021   Cervical radiculopathy 03/21/2021   History of pituitary surgery 03/06/2021   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   Pseudophakia of both eyes 06/29/2019   Steroid responders to  glaucoma of both eyes 06/29/2019   S/P coronary artery stent placement    PAD (peripheral artery disease) (HCC)    CAD (coronary artery disease) 12/26/2018   Angina pectoris (HCC)    Primary open-angle glaucoma 10/28/2018   Glaucoma associated with ocular inflammation 10/28/2018   Uveitic glaucoma of right eye, severe stage 10/28/2018    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) 08/21/2018   Aortic atherosclerosis (HCC) 08/19/2018   Uveitis 05/05/2018   Goiter 05/05/2018   Mixed hyperlipidemia 05/05/2018   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   Genetic testing 02/07/2018   Family history of kidney cancer 01/15/2018   Family history of pancreatic cancer    Family history of breast cancer    Family history of uterine cancer    Family history of colon cancer    Family history of prostate cancer    Family history of lung cancer    Hypertensive retinopathy of both eyes 11/12/2017   Iritis of right eye 11/12/2017   Nuclear sclerotic cataract of left eye 11/12/2017   Ocular hypertension of right eye 11/12/2017   Inflammation of eye, right 08/07/2017   Memory loss 09/21/2016   Essential hypertension 09/21/2016   Low vitamin D level 09/21/2016   Past Medical History:  Diagnosis Date   Allergic rhinitis    Allergy    Arthritis    Brain tumor (HCC)    Breast cyst 2007   Right   Cataract    surgery 07/2017   Clotting disorder (HCC)    On plavix since stents  for artery blockages   Depression 2006   w/ menopause   Depression screening 09/27/2021   Family history of adverse reaction to anesthesia    sister PONV   Family history of breast cancer    Family history of colon cancer    Family history of kidney cancer 01/15/2018   Family history of lung cancer    Family history of pancreatic cancer    Family history of prostate cancer    Family history of uterine cancer    GERD (gastroesophageal reflux disease)    Glaucoma    Heart murmur    History of hysterectomy, supracervical    Hyperlipidemia    Hypertension    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   MLH1 [id number] (Splice site)   Multinodular goiter    PAD (peripheral artery disease) (HCC)    LE Art [place] 1/18: Occluded prox-mid R SFA // ABIs 9/22: R 0.83; L 1.0   Pustular psoriasis    Tobacco user    Past  Surgical History:  Procedure Laterality Date   ABDOMINAL HYSTERECTOMY  1995   BIOPSY  01/15/2022   Procedure: BIOPSY;  Surgeon: Lemar Lofty., MD;  Location: [facility];  Service: Gastroenterology;;   BRAIN TUMOR EXCISION  02/24/2021   at [facility]   BREAST BIOPSY Bilateral 1985   CARDIAC CATHETERIZATION     CATARACT EXTRACTION Right 07/2017   COLONOSCOPY     COLONOSCOPY WITH PROPOFOL N/A 01/15/2022   Procedure: COLONOSCOPY WITH PROPOFOL;  Surgeon: Lemar Lofty., MD;  Location: [facility];  Service: Gastroenterology;  Laterality: N/A;   CORONARY STENT INTERVENTION N/A 12/26/2018   Procedure: CORONARY STENT INTERVENTION;  Surgeon: Lyn Records, MD;  Location: [facility];  Service: Cardiovascular;  Laterality: N/A;   ESOPHAGOGASTRODUODENOSCOPY (EGD) WITH PROPOFOL N/A 01/15/2022   Procedure: ESOPHAGOGASTRODUODENOSCOPY (EGD) WITH PROPOFOL;  Surgeon: Meridee Score Netty Starring., MD;  Location: [facility];  Service:  Gastroenterology;  Laterality: N/A;   EYE SURGERY  2019, 2020   Cataracts, eye shunt implants   LEFT HEART CATH AND CORONARY ANGIOGRAPHY N/A 12/17/2018   Procedure: LEFT HEART CATH AND CORONARY ANGIOGRAPHY;  Surgeon: Lyn Records, MD;  Location: [facility];  Service: Cardiovascular;  Laterality: N/A;   POLYPECTOMY  01/15/2022   Procedure: POLYPECTOMY;  Surgeon: Meridee Score Netty Starring., MD;  Location: [facility];  Service: Gastroenterology;;   SUPRACERVICAL ABDOMINAL HYSTERECTOMY  1986   h/o supracervical hysterectomy   UPPER GASTROINTESTINAL ENDOSCOPY     Allergies  Allergen Reactions   Sulfamethoxazole-Trimethoprim Other (See Comments) and Rash    Chills/achy Flu like symptoms    Conjugated Estrogens     Other reaction(s): rash   Wellbutrin [Bupropion Hcl] Rash    rash   Prior to Admission medications   Medication Sig Start Date End Date Taking? Authorizing Provider  Ascorbic Acid (VITAMIN C) 500 MG CAPS Take 1 tablet by mouth  daily.   Yes [provider]  aspirin 81 MG EC tablet Take 81 mg by mouth every evening.    Yes [provider]  B Complex Vitamins (VITAMIN B COMPLEX) TABS Take 1 tablet by mouth daily.   Yes [provider]  benazepril (LOTENSIN) 20 MG tablet Take 1 tablet (20 mg total) by mouth daily. 06/11/22  Yes Weaver, Scott T, PA-C  brimonidine (ALPHAGAN) 0.2 % ophthalmic solution Place 1 drop into both eyes 3 (three) times daily.  11/20/18  Yes [provider]  clopidogrel (PLAVIX) 75 MG tablet Take 1 tablet (75 mg total) by mouth daily. 09/11/22 03/10/23 Yes Flossie Dibble, NP  dorzolamide-timolol (COSOPT) 22.3-6.8 MG/ML ophthalmic solution Place 1 drop into the right eye 2 (two) times daily. 06/29/19  Yes [provider]  ezetimibe (ZETIA) 10 MG tablet Take 1 tablet (10 mg total) by mouth daily. 09/28/22  Yes Chilton Si, MD  folic acid (FOLVITE) 1 MG tablet Take 1 mg by mouth daily.    Yes [provider]  hydrocortisone (CORTEF) 10 MG tablet Take 10 mg by mouth daily. 07/08/21  Yes [provider]  hyoscyamine (LEVSIN SL) 0.125 MG SL tablet DISSOLVE 1 TO 2 TABLETS IN MOUTH UNDER THE TONGUE TWICE DAILY AS NEEDED 04/19/21  Yes Rachael Fee, MD  methotrexate (RHEUMATREX) 2.5 MG tablet Take by mouth once a week. Mondays Take 6 tablets   Yes [provider]  metoprolol succinate (TOPROL-XL) 25 MG 24 hr tablet Take 1 tablet (25 mg total) by mouth daily. 06/11/22  Yes Weaver, Scott T, PA-C  Multiple Vitamins-Minerals (MULTIVITAMIN WITH MINERALS) tablet Take 1 tablet by mouth daily.   Yes [provider]  Omega-3 Fatty Acids (FISH OIL) 1000 MG CAPS Take 1 capsule by mouth daily.    Yes [provider]  pantoprazole (PROTONIX) 40 MG tablet Take 1 tablet (40 mg total) by mouth daily. 07/26/21  Yes Zehr, Princella Pellegrini, PA-C  rosuvastatin (CRESTOR) 20 MG tablet Take 1 tablet (20 mg total) by mouth daily. 09/28/22  Yes Chilton Si, MD  SIMPONI ARIA 50 MG/4ML SOLN injection Inject 50 mg into the vein. Every 60 Days 07/16/19  Yes [provider]  Semaglutide-Weight Management (WEGOVY) 0.25 MG/0.5ML SOAJ Inject 0.25 mg into the skin once a week. Patient not taking: Reported on 10/08/2022 10/04/22   Glennis Brink, DO   Social History   Socioeconomic History   Marital status: Married    Spouse name: Tasia Catchings  Number of children: 0   Years of education: Not on file   Highest education level: Master's degree (e.g., MA, MS, MEng, MEd, MSW, MBA)  Occupational History   Occupation: Special Ed Teacher    Comment: Dentist  Tobacco Use   Smoking status: Former    Current packs/day: 0.00    Average packs/day: 0.3 packs/day for 36.0 years (9.0 ttl pk-yrs)    Types: Cigarettes    Start date: 08/01/1981    Quit date: 08/01/2017    Years since quitting: 5.1   Smokeless tobacco: Never   Tobacco comments:    Started smoking at about 72 yrs old  Vaping Use   Vaping status: Never Used  Substance and Sexual Activity   Alcohol use: Not Currently   Drug use: No   Sexual activity: Not Currently    Partners: Male    Birth control/protection: Post-menopausal, Surgical    Comment: Hysterectomy  Other Topics Concern   Not on file  Social History Narrative   Not on file   Social Determinants of Health   Financial Resource Strain: Low Risk  (08/16/2022)   Overall Financial Resource Strain (CARDIA)    Difficulty of Paying Living Expenses: Not hard at all  Food Insecurity: No Food Insecurity (08/16/2022)   Hunger Vital Sign    Worried About Running Out of Food in the Last Year: Never true    Ran Out of Food in the Last Year: Never true  Transportation Needs: No Transportation Needs (08/16/2022)   PRAPARE - Administrator, Civil Service (Medical): No    Lack of Transportation (Non-Medical): No  Physical Activity: Sufficiently Active (08/03/2021)   Exercise Vital Sign    Days of Exercise per Week:  6 days    Minutes of Exercise per Session: 30 min  Stress: No Stress Concern Present (08/16/2022)   [employer] of Occupational Health - Occupational Stress Questionnaire    Feeling of Stress : Not at all  Social Connections: Moderately Integrated (08/16/2022)   Social Connection and Isolation Panel [NHANES]    Frequency of Communication with Friends and Family: Three times a week    Frequency of Social Gatherings with Friends and Family: Once a week    Attends Religious Services: Never    Database administrator or Organizations: Yes    Attends Engineer, structural: More than 4 times per year    Marital Status: Married  Catering manager Violence: Not At Risk (08/16/2022)   Humiliation, Afraid, Rape, and Kick questionnaire    Fear of Current or Ex-Partner: No    Emotionally Abused: No    Physically Abused: No    Sexually Abused: No    Review of Systems  Constitutional:  Negative for fatigue and unexpected weight change.  Respiratory:  Negative for chest tightness and shortness of breath.   Cardiovascular:  Negative for chest pain, palpitations and leg swelling.  Gastrointestinal:  Negative for abdominal pain and blood in stool.  Neurological:  Negative for dizziness, syncope, light-headedness and headaches.     Objective:   Vitals:   10/08/22 0825  BP: 130/80  Pulse: 79  Resp: 18  Temp: 98.3 F (36.8 C)  TempSrc: Temporal  SpO2: 97%  Weight: 204 lb 2 oz (92.6 kg)  Height: 5\' 6"  (1.676 m)     Physical Exam Vitals reviewed.  Constitutional:      Appearance: Normal appearance. She is well-developed.  HENT:     Head: Normocephalic and atraumatic.  Right Ear: Tympanic membrane, ear canal and external ear normal.     Left Ear: Tympanic membrane and external ear normal.     Ears:     Comments: Abrasion left canal near entrance, dried blood covering.  No active bleeding.  TM intact, no canal edema or erythema surrounding the abrasion. Eyes:      Conjunctiva/sclera: Conjunctivae normal.     Pupils: Pupils are equal, round, and reactive to light.  Neck:     Vascular: No carotid bruit.  Cardiovascular:     Rate and Rhythm: Normal rate and regular rhythm.     Heart sounds: Normal heart sounds.  Pulmonary:     Effort: Pulmonary effort is normal.     Breath sounds: Normal breath sounds.  Abdominal:     Palpations: Abdomen is soft. There is no pulsatile mass.     Tenderness: There is no abdominal tenderness.  Musculoskeletal:     Right lower leg: No edema.     Left lower leg: No edema.  Skin:    General: Skin is warm and dry.  Neurological:     Mental Status: She is alert and oriented to person, place, and time.  Psychiatric:        Mood and Affect: Mood normal.        Behavior: Behavior normal.        Assessment & Plan:  DAVISHA LINTHICUM is a 72 y.o. female . Pre-diabetes  -  Stable, check updated A1c.  Continue follow-up with healthy weight and wellness.  Unfortunately semaglutide was not covered.  Essential hypertension  -Stable off HCTZ, continue same regimen and follow-up with nephrology, cardiology as planned.  Avoid nephrotoxins with history of CKD.  Check updated labs.  Coronary artery disease involving native coronary artery of native heart without angina pectoris  -Asymptomatic, follow-up with cardiology as above.  Mixed hyperlipidemia  -Tolerating current med regimen, no changes.  Check updated labs and adjust Crestor as needed.  Abrasion of left ear, initial encounter  -Avoidance of Q-tips discussed, small abrasion without signs of infection.  TM intact without perforation.  RTC precautions given.  No orders of the defined types were placed in this encounter.  Patient Instructions  No sign of infection, but there is a scratch/abrasion in your left ear canal. Avoid q tips for now. If any pain, swelling or worse symptoms be seen.   No other med changes today. Take care!     Signed,   Meredith Staggers, MD [facility], [facility] 10/08/22 8:52 AM

## 2022-10-08 NOTE — Patient Instructions (Signed)
No sign of infection, but there is a scratch/abrasion in your left ear canal. Avoid q tips for now. If any pain, swelling or worse symptoms be seen.   No other med changes today. Take care!

## 2022-10-09 ENCOUNTER — Other Ambulatory Visit: Payer: Medicare Other

## 2022-10-09 LAB — COMPREHENSIVE METABOLIC PANEL
ALT: 45 U/L — ABNORMAL HIGH (ref 0–35)
AST: 50 U/L — ABNORMAL HIGH (ref 0–37)
Albumin: 4.1 g/dL (ref 3.5–5.2)
Alkaline Phosphatase: 61 U/L (ref 39–117)
BUN: 19 mg/dL (ref 6–23)
CO2: 30 mEq/L (ref 19–32)
Calcium: 10 mg/dL (ref 8.4–10.5)
Chloride: 106 mEq/L (ref 96–112)
Creatinine, Ser: 1.09 mg/dL (ref 0.40–1.20)
GFR: 51.18 mL/min — ABNORMAL LOW (ref >60.00)
Glucose, Bld: 107 mg/dL — ABNORMAL HIGH (ref 70–99)
Potassium: 4 mEq/L (ref 3.5–5.1)
Sodium: 141 mEq/L (ref 135–145)
Total Bilirubin: 0.9 mg/dL (ref 0.2–1.2)
Total Protein: 7.1 g/dL (ref 6.0–8.3)

## 2022-10-09 LAB — LIPID PANEL
Cholesterol: 162 mg/dL (ref 0–200)
HDL: 78.5 mg/dL (ref >39.00)
LDL Cholesterol: 73 mg/dL (ref 0–99)
NonHDL: 83.95
Total CHOL/HDL Ratio: 2
Triglycerides: 56 mg/dL (ref 0.0–149.0)
VLDL: 11.2 mg/dL (ref 0.0–40.0)

## 2022-10-09 LAB — HEMOGLOBIN A1C: Hgb A1c MFr Bld: 5.7 % (ref 4.6–6.5)

## 2022-10-09 NOTE — Telephone Encounter (Signed)
Patient Name: Pamela Lowe Patient DOB: [birth year] Patient ID: [id number]  Status of Request: Deny Medication Name: Vincente Liberty 0.25mg  GPI/NDC: [id number]  Decision Notes:  The requested drug is not covered by the plan. Please contact member services for further assistance.

## 2022-10-10 DIAGNOSIS — M47819 Spondylosis without myelopathy or radiculopathy, site unspecified: Secondary | ICD-10-CM | POA: Diagnosis not present

## 2022-10-10 DIAGNOSIS — M25571 Pain in right ankle and joints of right foot: Secondary | ICD-10-CM | POA: Diagnosis not present

## 2022-10-10 DIAGNOSIS — M25572 Pain in left ankle and joints of left foot: Secondary | ICD-10-CM | POA: Diagnosis not present

## 2022-10-10 DIAGNOSIS — Z79899 Other long term (current) drug therapy: Secondary | ICD-10-CM | POA: Diagnosis not present

## 2022-10-10 DIAGNOSIS — M79672 Pain in left foot: Secondary | ICD-10-CM | POA: Diagnosis not present

## 2022-10-10 DIAGNOSIS — E669 Obesity, unspecified: Secondary | ICD-10-CM | POA: Diagnosis not present

## 2022-10-10 DIAGNOSIS — M1990 Unspecified osteoarthritis, unspecified site: Secondary | ICD-10-CM | POA: Diagnosis not present

## 2022-10-10 DIAGNOSIS — M25569 Pain in unspecified knee: Secondary | ICD-10-CM | POA: Diagnosis not present

## 2022-10-10 DIAGNOSIS — M5137 Other intervertebral disc degeneration, lumbosacral region: Secondary | ICD-10-CM | POA: Diagnosis not present

## 2022-10-10 DIAGNOSIS — L4050 Arthropathic psoriasis, unspecified: Secondary | ICD-10-CM | POA: Diagnosis not present

## 2022-10-10 DIAGNOSIS — M79671 Pain in right foot: Secondary | ICD-10-CM | POA: Diagnosis not present

## 2022-10-15 DIAGNOSIS — Z961 Presence of intraocular lens: Secondary | ICD-10-CM | POA: Diagnosis not present

## 2022-10-15 DIAGNOSIS — H4042X2 Glaucoma secondary to eye inflammation, left eye, moderate stage: Secondary | ICD-10-CM | POA: Diagnosis not present

## 2022-10-15 DIAGNOSIS — H209 Unspecified iridocyclitis: Secondary | ICD-10-CM | POA: Diagnosis not present

## 2022-10-15 DIAGNOSIS — H4041X3 Glaucoma secondary to eye inflammation, right eye, severe stage: Secondary | ICD-10-CM | POA: Diagnosis not present

## 2022-10-17 ENCOUNTER — Other Ambulatory Visit: Payer: Self-pay | Admitting: Cardiovascular Disease

## 2022-10-17 ENCOUNTER — Other Ambulatory Visit: Payer: Self-pay | Admitting: Physician Assistant

## 2022-10-17 NOTE — Telephone Encounter (Signed)
Rx request sent to pharmacy.  

## 2022-10-17 NOTE — Progress Notes (Signed)
This encounter was created in error - please disregard.

## 2022-10-22 ENCOUNTER — Other Ambulatory Visit: Payer: Self-pay | Admitting: Physician Assistant

## 2022-10-22 DIAGNOSIS — L4050 Arthropathic psoriasis, unspecified: Secondary | ICD-10-CM | POA: Diagnosis not present

## 2022-10-30 ENCOUNTER — Encounter (INDEPENDENT_AMBULATORY_CARE_PROVIDER_SITE_OTHER): Payer: Self-pay | Admitting: Family Medicine

## 2022-10-30 ENCOUNTER — Ambulatory Visit (INDEPENDENT_AMBULATORY_CARE_PROVIDER_SITE_OTHER): Payer: Medicare Other | Admitting: Family Medicine

## 2022-10-30 VITALS — BP 145/85 | HR 61 | Temp 98.2°F | Ht 66.0 in | Wt 193.0 lb

## 2022-10-30 DIAGNOSIS — I10 Essential (primary) hypertension: Secondary | ICD-10-CM | POA: Diagnosis not present

## 2022-10-30 DIAGNOSIS — Z6831 Body mass index (BMI) 31.0-31.9, adult: Secondary | ICD-10-CM

## 2022-10-30 DIAGNOSIS — R7303 Prediabetes: Secondary | ICD-10-CM

## 2022-10-30 DIAGNOSIS — E559 Vitamin D deficiency, unspecified: Secondary | ICD-10-CM | POA: Diagnosis not present

## 2022-10-30 DIAGNOSIS — E669 Obesity, unspecified: Secondary | ICD-10-CM

## 2022-10-30 NOTE — Progress Notes (Signed)
Office: [phone]  /  Fax: [phone]  WEIGHT SUMMARY AND BIOMETRICS  Starting Date: 09/27/21  Starting Weight: 213lb   Weight Lost Since Last Visit: 6lb   Vitals Temp: 98.2 F (36.8 C) BP: (!) 145/85 Pulse Rate: 61 SpO2: 100 %   Body Composition  Body Fat %: 41.5 % Fat Mass (lbs): 80.4 lbs Muscle Mass (lbs): 107.6 lbs Total Body Water (lbs): 79 lbs Visceral Fat Rating : 12    HPI  Chief Complaint: OBESITY  Pamela Lowe is here to discuss her progress with her obesity treatment plan. She is on the the Category 1 Plan and states she is following her eating plan approximately 80 % of the time. She states she is biking and walking for  30 minutes 4-5 times per week.   Interval History:  Since last office visit she is down 6 lb She did try implementing more dietary changes on the Meditteranean diet which flared her gout.  She was eating a lot of shellfish and higher purine veggies She is measuring her food and weighing it She is back to riding her bike now that her foot pain has improved She is down a net 20 lb in the past year This is a 9.3% TBW loss Her max weight was 235 lb.  Her energy level has improved Her insurance did not cover [phone] (even with CAD diagnosis)  Pharmacotherapy: none  PHYSICAL EXAM:  Blood pressure (!) 145/85, pulse 61, temperature 98.2 F (36.8 C), height 5\' 6"  (1.676 m), weight 193 lb (87.5 kg), last menstrual period 03/19/1998, SpO2 100%. Body mass index is 31.15 kg/m.  General: She is overweight, cooperative, alert, well developed, and in no acute distress. PSYCH: Has normal mood, affect and thought process.   Lungs: Normal breathing effort, no conversational dyspnea.   ASSESSMENT AND PLAN  TREATMENT PLAN FOR OBESITY:  Recommended Dietary Goals  Joud is currently in the action stage of change. As such, her goal is to continue weight management plan. She has agreed to practicing portion control and making smarter food  choices, such as increasing vegetables and decreasing simple carbohydrates.  Behavioral Intervention  We discussed the following Behavioral Modification Strategies today: increasing lean protein intake, decreasing simple carbohydrates , increasing vegetables, increasing lower glycemic fruits, increasing water intake, work on meal planning and preparation, keeping healthy foods at home, continue to practice mindfulness when eating, and planning for success.  Additional resources provided today: NA  Recommended Physical Activity Goals  Madysin has been advised to work up to 150 minutes of moderate intensity aerobic activity a week and strengthening exercises 2-3 times per week for cardiovascular health, weight loss maintenance and preservation of muscle mass.   She has agreed to Increase the intensity, frequency or duration of strengthening exercises  and Increase the intensity, frequency or duration of aerobic exercises    Pharmacotherapy changes for the treatment of obesity: none  ASSOCIATED CONDITIONS ADDRESSED TODAY  Pre-diabetes Assessment & Plan: Lab Results  Component Value Date   HGBA1C 5.7 10/09/2022   Reviewed most recent lab with patient.  Her A1c has dropped from 5.9 to 5.7 with dietary changes, regular exercise and weight loss.  She has not needed metformin.   Continue to work on a low sugar diet rich in lean protein and fiber     Generalized obesity wtih starting BMI 34  BMI 31.0-31.9,adult  Essential hypertension Assessment & Plan: BP is elevated today while on Toprol XL 25 mg daily + Benazepril 20 mg daily.  Denies CP or HA.  She is scheduled for upcoming cardiology follow up visit with Dr [facility] Salvia 10/30 with HTN and stable CAD.  Continue current BP medications Continue active plan for weight reduction with a goal BMI < 30   Vitamin D deficiency Assessment & Plan: Last vitamin D Lab Results  Component Value Date   VD25OH 30.3 09/27/2021   She has been  taking OTC vitamin D 2,000 international units  once daily.  Her level has not been rechecked and her goal is 50-70 range. She denies excess fatigue.  Continue current vitamin D supplement and recheck next visit.       She was informed of the importance of frequent follow up visits to maximize her success with intensive lifestyle modifications for her multiple health conditions.   ATTESTASTION STATEMENTS:  Reviewed by clinician on day of visit: allergies, medications, problem list, medical history, surgical history, family history, social history, and previous encounter notes pertinent to obesity diagnosis.   I have personally spent 30 minutes total time today in preparation, patient care, nutritional counseling and documentation for this visit, including the following: review of clinical lab tests; review of medical tests/procedures/services.      Glennis Brink, DO DABFM, DABOM [facility] [address] [place], [place] [address] [phone]

## 2022-10-30 NOTE — Assessment & Plan Note (Signed)
BP is elevated today while on Toprol XL 25 mg daily + Benazepril 20 mg daily.  Denies CP or HA.  She is scheduled for upcoming cardiology follow up visit with Dr [facility] Salvia 10/30 with HTN and stable CAD.  Continue current BP medications Continue active plan for weight reduction with a goal BMI < 30

## 2022-10-30 NOTE — Assessment & Plan Note (Signed)
Last vitamin D Lab Results  Component Value Date   VD25OH 30.3 09/27/2021   She has been taking OTC vitamin D 2,000 international units  once daily.  Her level has not been rechecked and her goal is 50-70 range. She denies excess fatigue.  Continue current vitamin D supplement and recheck next visit.

## 2022-10-30 NOTE — Assessment & Plan Note (Signed)
Lab Results  Component Value Date   HGBA1C 5.7 10/09/2022   Reviewed most recent lab with patient.  Her A1c has dropped from 5.9 to 5.7 with dietary changes, regular exercise and weight loss.  She has not needed metformin.   Continue to work on a low sugar diet rich in lean protein and fiber

## 2022-12-03 ENCOUNTER — Ambulatory Visit (INDEPENDENT_AMBULATORY_CARE_PROVIDER_SITE_OTHER): Payer: Medicare Other | Admitting: Family Medicine

## 2022-12-03 DIAGNOSIS — E210 Primary hyperparathyroidism: Secondary | ICD-10-CM | POA: Diagnosis not present

## 2022-12-03 DIAGNOSIS — D352 Benign neoplasm of pituitary gland: Secondary | ICD-10-CM | POA: Diagnosis not present

## 2022-12-03 DIAGNOSIS — E049 Nontoxic goiter, unspecified: Secondary | ICD-10-CM | POA: Diagnosis not present

## 2022-12-03 DIAGNOSIS — E230 Hypopituitarism: Secondary | ICD-10-CM | POA: Diagnosis not present

## 2022-12-17 DIAGNOSIS — M059 Rheumatoid arthritis with rheumatoid factor, unspecified: Secondary | ICD-10-CM | POA: Diagnosis not present

## 2022-12-17 DIAGNOSIS — L4050 Arthropathic psoriasis, unspecified: Secondary | ICD-10-CM | POA: Diagnosis not present

## 2022-12-24 DIAGNOSIS — D320 Benign neoplasm of cerebral meninges: Secondary | ICD-10-CM | POA: Diagnosis not present

## 2022-12-24 DIAGNOSIS — Z9889 Other specified postprocedural states: Secondary | ICD-10-CM | POA: Diagnosis not present

## 2022-12-24 DIAGNOSIS — D329 Benign neoplasm of meninges, unspecified: Secondary | ICD-10-CM | POA: Diagnosis not present

## 2022-12-24 DIAGNOSIS — I6782 Cerebral ischemia: Secondary | ICD-10-CM | POA: Diagnosis not present

## 2022-12-31 DIAGNOSIS — L658 Other specified nonscarring hair loss: Secondary | ICD-10-CM | POA: Diagnosis not present

## 2023-01-01 DIAGNOSIS — H4042X1 Glaucoma secondary to eye inflammation, left eye, mild stage: Secondary | ICD-10-CM | POA: Diagnosis not present

## 2023-01-01 DIAGNOSIS — H209 Unspecified iridocyclitis: Secondary | ICD-10-CM | POA: Diagnosis not present

## 2023-01-01 DIAGNOSIS — H4041X3 Glaucoma secondary to eye inflammation, right eye, severe stage: Secondary | ICD-10-CM | POA: Diagnosis not present

## 2023-01-02 DIAGNOSIS — D329 Benign neoplasm of meninges, unspecified: Secondary | ICD-10-CM | POA: Diagnosis not present

## 2023-01-04 ENCOUNTER — Telehealth (HOSPITAL_BASED_OUTPATIENT_CLINIC_OR_DEPARTMENT_OTHER): Payer: Self-pay | Admitting: Cardiovascular Disease

## 2023-01-04 DIAGNOSIS — E782 Mixed hyperlipidemia: Secondary | ICD-10-CM

## 2023-01-04 DIAGNOSIS — I10 Essential (primary) hypertension: Secondary | ICD-10-CM

## 2023-01-04 MED ORDER — HYDROCHLOROTHIAZIDE 12.5 MG PO CAPS: 12.5000 mg | ORAL_CAPSULE | Freq: Every day | ORAL | 1 refills | Status: DC

## 2023-01-04 NOTE — Telephone Encounter (Signed)
Returned call to patient,   Patient states she is wanting to speak with someone because her blood pressure has been running a little high. She is wanting to know if she needs to take more medications until she can be seen. She states she has not been checking it regularly but started taking it when her head started hurting   Her BP log as reported includes,  Am before meds 139/86 Took meds at 0800 Repeat pressure while on the phone at 1037 is 155/91 61.    She states she had an MRI done showed some microvascular changes of her white matter.   Advised patient we would send for review to see if any changes need to be made before scheduled appointment on 10/30.

## 2023-01-04 NOTE — Telephone Encounter (Signed)
Pt c/o BP issue: STAT if pt c/o blurred vision, one-sided weakness or slurred speech  1. What are your last 5 BP readings? 147/85 139/86 137/87 this morning  2. Are you having any other symptoms (ex. Dizziness, headache, blurred vision, passed out)? Headache off and on for the past 2 days  3. What is your BP issue? High blood pressure for her- please call to evaluate she sad she also concerns about her MRI results and will tell you about them

## 2023-01-04 NOTE — Telephone Encounter (Signed)
Returned call to patient, recommendations reviewed with patient, she verbalizes understanding.    "Restart hydrochlorothiazide 12.5mg  daily.   BMP 3 days before her appointment.  TCR"

## 2023-01-06 ENCOUNTER — Other Ambulatory Visit: Payer: Self-pay | Admitting: Cardiology

## 2023-01-07 ENCOUNTER — Encounter (INDEPENDENT_AMBULATORY_CARE_PROVIDER_SITE_OTHER): Payer: Self-pay | Admitting: Family Medicine

## 2023-01-07 ENCOUNTER — Ambulatory Visit (INDEPENDENT_AMBULATORY_CARE_PROVIDER_SITE_OTHER): Payer: Medicare Other | Admitting: Family Medicine

## 2023-01-07 VITALS — BP 103/70 | HR 78 | Temp 98.0°F | Ht 66.0 in | Wt 194.0 lb

## 2023-01-07 DIAGNOSIS — E559 Vitamin D deficiency, unspecified: Secondary | ICD-10-CM

## 2023-01-07 DIAGNOSIS — Z6831 Body mass index (BMI) 31.0-31.9, adult: Secondary | ICD-10-CM | POA: Diagnosis not present

## 2023-01-07 DIAGNOSIS — I25119 Atherosclerotic heart disease of native coronary artery with unspecified angina pectoris: Secondary | ICD-10-CM

## 2023-01-07 DIAGNOSIS — E782 Mixed hyperlipidemia: Secondary | ICD-10-CM | POA: Diagnosis not present

## 2023-01-07 DIAGNOSIS — R7303 Prediabetes: Secondary | ICD-10-CM | POA: Diagnosis not present

## 2023-01-07 DIAGNOSIS — I10 Essential (primary) hypertension: Secondary | ICD-10-CM

## 2023-01-07 DIAGNOSIS — E669 Obesity, unspecified: Secondary | ICD-10-CM

## 2023-01-07 NOTE — Progress Notes (Signed)
Pamela Lowe, D.O.  ABFM, ABOM Specializing in Clinical Bariatric Medicine  Office located at: [address] [place], [place]  [address]     Assessment and Plan:  Draw labs next OV if not done by PCP that she see's on 10/30.   Medications Discontinued During This Encounter  Medication Reason   pantoprazole (PROTONIX) 40 MG tablet     Pre-diabetes Assessment: Condition is Not at goal.. This is diet/exercise controlled. Pt notes that since she started wearing invisaline this has deterred her from eating as much. He endorses having hunger and cravings when she eats carbs. She was previously prescribed Wegovy but her insurance didn't cover it.  Lab Results  Component Value Date   HGBA1C 5.7 10/09/2022   HGBA1C 5.9 04/09/2022   HGBA1C 5.6 09/27/2021   INSULIN 12.2 09/27/2021    Plan: - Continue to decrease simple carbs/ sugars; increase fiber and proteins -> follow her meal plan.    - Explained role of simple carbs and insulin levels on hunger and cravings  - Anticipatory guidance given.    - Callen will continue to work on weight loss, exercise, via their meal plan we devised to help decrease the risk of progressing to diabetes.  - We will recheck A1c and fasting insulin level in approximately 3 months from last check, or as deemed appropriate.    Vitamin D deficiency Assessment: Condition is Not at goal.. She continues OTC vitamin D supplement without difficulty. Her vitamin D level was at 30.3 as of last year.  Lab Results  Component Value Date   VD25OH 30.3 09/27/2021   VD25OH 40.6 08/10/2021   VD25OH 41.0 09/21/2016   Plan: - Continue OTC 2,000 lU vitamin D supplement as directed.   - weight loss will likely improve availability of vitamin D, thus encouraged Pamela Lowe to continue with meal plan and their weight loss efforts to further improve this condition.  Thus, we will need to monitor levels regularly (every 3-4 mo on average) to keep levels within normal  limits and prevent over supplementation.   Essential hypertension Assessment: Condition is Controlled.. She continues with Toprol XL, Benazepril, and hydrochlorothiazide without difficulty. She denies any adverse side effects on these medications. She occasionally checks her BP at home. She restarted hydrochlorothiazide 01/04/2023 BP has come down and is regulated.  Last 3 blood pressure readings in our office are as follows: BP Readings from Last 3 Encounters:  01/07/23 103/70  10/30/22 (!) 145/85  10/08/22 130/80   Plan: Continue Toprol XL 25mg , Benazepril 20mg , and hydrochlorothiazide 12.5mg  as directed.   - Pamela Lowe BP is 103/70 at goal today. I informed her to make sure her BP isn't too low and explained potential symptoms of low BP.  - Ambulatory blood pressure monitoring encouraged.   - We will continue to monitor closely alongside PCP/ specialists.  Pt reminded to also f/up with those individuals as instructed by them.    Mixed hyperlipidemia Assessment: This is controlled with Crestor, Zetia, and fish oil to help improve this. She tolerates this well and denies any adverse side effects.  Lab Results  Component Value Date   CHOL 162 10/09/2022   HDL 78.50 10/09/2022   LDLCALC 73 10/09/2022   TRIG 56.0 10/09/2022   CHOLHDL 2 10/09/2022   Plan: - Continue with Zetia 10mg , Crestor 20mg , and fish oil as directed.   - I stressed the importance that patient continue with our prudent nutritional plan that is low in saturated and  trans fats, and low in fatty carbs to improve these numbers.   - We recommend: aerobic activity with eventual goal of a minimum of 150+ min wk plus 2 days/ week of resistance or strength training.    - We will continue routine screening as patient continues to achieve health goals along their weight loss journey.   Coronary artery disease involving native coronary artery of native heart with angina pectoris [employer]) Assessment: Pt has a hx of CAD  (DES to LAD and first diagonal 12/2018). Because of her history of CAD she remains on Plavix and is followed regularly by cardiology. She will see them in 2 weeks. Dr. Cathey Endow previously prescribed [phone] but this was not covered by insurance  Plan: -  Pt is to have BP well controlled of 130/80 or less and have a LDL of 70 or less.   - She is to continue to exercise and continue excellent control of A1c  - I educated pt that often insurance pays for [facility] for CAD and we can consider this in the future if hunger and cravings become a issue.    TREATMENT PLAN FOR OBESITY: BMI 31.0-31.9,adult Generalized obesity wtih starting BMI 34 Assessment:  Pamela Lowe is here to discuss her progress with her obesity treatment plan along with follow-up of her obesity related diagnoses. See Medical Weight Management Flowsheet for complete bioelectrical impedance results.  Condition is docourse: optimized. Biometric data collected today, was reviewed with patient.   Since last office visit on 10/30/2022 patient's  Muscle mass has increased by 2lb. Fat mass has decreased by 1.6lb. Total body water has increased by 2.2lb.  Counseling done on how various foods will affect these numbers and how to maximize success  Total lbs lost to date: 19 Total weight loss percentage to date: 8.92%  Plan: - Continue to follow the Category 1 nutritional plan.   Behavioral Intervention Additional resources provided today: patient declined Evidence-based interventions for health behavior change were utilized today including the discussion of self monitoring techniques, problem-solving barriers and SMART goal setting techniques.   Regarding patient's less desirable eating habits and patterns, we employed the technique of small changes.  Pt will specifically work on: start weight lifting 2-3 days and continue [facility] program for next visit.     She has agreed to Continue current level of physical activity    FOLLOW  UP: Return in about 4 weeks (around 02/04/2023).  She was informed of the importance of frequent follow up visits to maximize her success with intensive lifestyle modifications for her multiple health conditions.  Subjective:   Chief complaint: Obesity Pamela Lowe is here to discuss her progress with her obesity treatment plan. She is on the the Category 1 Plan and states she is following her eating plan approximately 80% of the time. She states she is biking 30 minutes 5 days per week.  Interval History:  Pamela Lowe is here for a follow up office visit.     Since last office visit:  She usually sees Dr. Cathey Endow. She notes having good days and bad days. She has rheumatoid arthritis and informs me she will eventually need knee replacement. This gives her limitations on the exercises and workouts she can do. She uses her stationary bike currently. She started with the [facility] diet program and has lost 43lbs in total, she enjoys this program better than [facility] as she became bored of the plan and liquid diet. She was on vacation for the past  3 weeks and ate off plan with family but was physically active while away. She informed me that she is apart of [facility] exercise program.   We reviewed her meal plan and all questions were answered.   Review of Systems:  Pertinent positives were addressed with patient today.  Reviewed by clinician on day of visit: allergies, medications, problem list, medical history, surgical history, family history, social history, and previous encounter notes.  Weight Summary and Biometrics   Weight Lost Since Last Visit: 0l b  Weight Gained Since Last Visit: 1lb   Vitals Temp: 98 F (36.7 C) BP: 103/70 Pulse Rate: 78 SpO2: 97 %   Anthropometric Measurements Height: 5\' 6"  (1.676 m) Weight: 194 lb (88 kg) BMI (Calculated): 31.33 Weight at Last Visit: 193lb Weight Lost Since Last Visit: 0l b Weight Gained Since Last Visit: 1lb Starting Weight:  213lb Total Weight Loss (lbs): 19 lb (8.618 kg) Peak Weight: 213lb   Body Composition  Body Fat %: 40.6 % Fat Mass (lbs): 78.8 lbs Muscle Mass (lbs): 109.6 lbs Total Body Water (lbs): 76.8 lbs Visceral Fat Rating : 12   Other Clinical Data Fasting: no Labs: no Today's Visit #: 10 Starting Date: 09/27/21     Objective:   PHYSICAL EXAM: Blood pressure 103/70, pulse 78, temperature 98 F (36.7 C), height 5\' 6"  (1.676 m), weight 194 lb (88 kg), last menstrual period 03/19/1998, SpO2 97%. Body mass index is 31.31 kg/m.  General: Well Developed, well nourished, and in no acute distress.  HEENT: Normocephalic, atraumatic Skin: Warm and dry, cap RF less 2 sec, good turgor Chest:  Normal excursion, shape, no gross abn Respiratory: speaking in full sentences, no conversational dyspnea NeuroM-Sk: Ambulates w/o assistance, moves * 4 Psych: A and O *3, insight good, mood-full  DIAGNOSTIC DATA REVIEWED:  BMET    Component Value Date/Time   NA 141 10/09/2022 1008   NA 140 09/27/2021 0900   K 4.0 10/09/2022 1008   CL 106 10/09/2022 1008   CO2 30 10/09/2022 1008   GLUCOSE 107 (H) 10/09/2022 1008   BUN 19 10/09/2022 1008   BUN 13 09/27/2021 0900   CREATININE 1.09 10/09/2022 1008   CREATININE 1.41 (H) 12/16/2012 1554   CALCIUM 10.0 10/09/2022 1008   GFRNONAA 53 (L) 11/02/2019 1814   GFRAA >60 11/02/2019 1814   Lab Results  Component Value Date   HGBA1C 5.7 10/09/2022   HGBA1C 5.8 (A) 04/05/2021   Lab Results  Component Value Date   INSULIN 12.2 09/27/2021   Lab Results  Component Value Date   TSH 0.823 09/27/2021   CBC    Component Value Date/Time   WBC 9.8 03/27/2022 1209   RBC 4.67 03/27/2022 1209   HGB 14.2 03/27/2022 1209   HGB 13.5 04/06/2013 1527   HCT 43.2 03/27/2022 1209   PLT 319.0 03/27/2022 1209   MCV 92.6 03/27/2022 1209   MCH 30.7 12/18/2021 1507   MCHC 33.0 03/27/2022 1209   RDW 14.4 03/27/2022 1209   Iron Studies    Component Value  Date/Time   FERRITIN 28 12/18/2021 1507   Lipid Panel     Component Value Date/Time   CHOL 162 10/09/2022 1008   CHOL 148 09/27/2021 0900   TRIG 56.0 10/09/2022 1008   HDL 78.50 10/09/2022 1008   HDL 83 09/27/2021 0900   CHOLHDL 2 10/09/2022 1008   VLDL 11.2 10/09/2022 1008   LDLCALC 73 10/09/2022 1008   LDLCALC 49 09/27/2021 0900   Hepatic Function Panel  Component Value Date/Time   PROT 7.1 10/09/2022 1008   PROT 6.4 09/27/2021 0900   ALBUMIN 4.1 10/09/2022 1008   ALBUMIN 3.9 09/27/2021 0900   AST 50 (H) 10/09/2022 1008   ALT 45 (H) 10/09/2022 1008   ALKPHOS 61 10/09/2022 1008   BILITOT 0.9 10/09/2022 1008   BILITOT 0.8 09/27/2021 0900   BILIDIR 0.21 05/29/2021 1136   IBILI 0.6 12/13/2012 1058      Component Value Date/Time   TSH 0.823 09/27/2021 0900   Nutritional Lab Results  Component Value Date   VD25OH 30.3 09/27/2021   VD25OH 40.6 08/10/2021   VD25OH 41.0 09/21/2016    Attestations:   This encounter took 43 total minutes of time including any pre-visit and post-visit time spent on this date of service, including taking a thorough history, reviewing any labs and/or imaging, reviewing prior notes, as well as documenting in the electronic health record on the date of service. Over 50% of that time was in direct face-to-face counseling and coordinating care for the patient today  I, Clinical biochemist, acting as a Stage manager for [employer], DO., have compiled all relevant documentation for today's office visit on behalf of Thomasene Lot, DO, while in the presence of [employer], DO.  I have reviewed the above documentation for accuracy and completeness, and I agree with the above. Pamela Lowe, D.O.  The 21st Century Cures Act was signed into law in 2016 which includes the topic of electronic health records.  This provides immediate access to information in MyChart.  This includes consultation notes, operative notes, office notes, lab  results and pathology reports.  If you have any questions about what you read please let us know at your next visit so we can discuss your concerns and take corrective action if need be.  We are right here with you.

## 2023-01-09 DIAGNOSIS — J3489 Other specified disorders of nose and nasal sinuses: Secondary | ICD-10-CM | POA: Diagnosis not present

## 2023-01-09 DIAGNOSIS — J310 Chronic rhinitis: Secondary | ICD-10-CM | POA: Diagnosis not present

## 2023-01-09 DIAGNOSIS — D329 Benign neoplasm of meninges, unspecified: Secondary | ICD-10-CM | POA: Diagnosis not present

## 2023-01-09 DIAGNOSIS — J339 Nasal polyp, unspecified: Secondary | ICD-10-CM | POA: Diagnosis not present

## 2023-01-09 DIAGNOSIS — Z9889 Other specified postprocedural states: Secondary | ICD-10-CM | POA: Diagnosis not present

## 2023-01-10 ENCOUNTER — Ambulatory Visit (INDEPENDENT_AMBULATORY_CARE_PROVIDER_SITE_OTHER): Payer: Medicare Other | Admitting: Family Medicine

## 2023-01-10 ENCOUNTER — Telehealth: Payer: Self-pay | Admitting: Gastroenterology

## 2023-01-10 DIAGNOSIS — Z23 Encounter for immunization: Secondary | ICD-10-CM | POA: Diagnosis not present

## 2023-01-10 NOTE — Telephone Encounter (Signed)
She does need an office visit. Can you please get her set up for his next available appt.

## 2023-01-10 NOTE — Progress Notes (Signed)
Pt was given HD Flu shot in left arm no issues

## 2023-01-10 NOTE — Telephone Encounter (Signed)
Inbound call from patient wishing to schedule recall colonoscopy. Patient is currently taking Plavix. Patient last seen with Dr. Meridee Score in January 2024. Patient states she has an appointment with her cardiologist next week. Patient requesting to know if she needs an office visit prior to colonoscopy. Please advise on scheduling. Thank you.

## 2023-01-14 DIAGNOSIS — N1831 Chronic kidney disease, stage 3a: Secondary | ICD-10-CM | POA: Diagnosis not present

## 2023-01-14 DIAGNOSIS — I10 Essential (primary) hypertension: Secondary | ICD-10-CM | POA: Diagnosis not present

## 2023-01-15 LAB — BASIC METABOLIC PANEL
BUN/Creatinine Ratio: 19 (ref 12–28)
BUN: 22 mg/dL (ref 8–27)
CO2: 26 mmol/L (ref 20–29)
Calcium: 9.6 mg/dL (ref 8.7–10.3)
Chloride: 102 mmol/L (ref 96–106)
Creatinine, Ser: 1.15 mg/dL — ABNORMAL HIGH (ref 0.57–1.00)
Glucose: 103 mg/dL — ABNORMAL HIGH (ref 70–99)
Potassium: 4.1 mmol/L (ref 3.5–5.2)
Sodium: 137 mmol/L (ref 134–144)
eGFR: 51 mL/min/{1.73_m2} — ABNORMAL LOW (ref >59)

## 2023-01-16 ENCOUNTER — Ambulatory Visit (INDEPENDENT_AMBULATORY_CARE_PROVIDER_SITE_OTHER): Payer: Medicare Other | Admitting: Cardiovascular Disease

## 2023-01-16 ENCOUNTER — Encounter (HOSPITAL_BASED_OUTPATIENT_CLINIC_OR_DEPARTMENT_OTHER): Payer: Self-pay | Admitting: Cardiovascular Disease

## 2023-01-16 VITALS — BP 112/68 | HR 55 | Ht 66.14 in | Wt 198.0 lb

## 2023-01-16 DIAGNOSIS — I10 Essential (primary) hypertension: Secondary | ICD-10-CM | POA: Diagnosis not present

## 2023-01-16 DIAGNOSIS — I25758 Atherosclerosis of native coronary artery of transplanted heart with other forms of angina pectoris: Secondary | ICD-10-CM

## 2023-01-16 DIAGNOSIS — R7989 Other specified abnormal findings of blood chemistry: Secondary | ICD-10-CM | POA: Diagnosis not present

## 2023-01-16 DIAGNOSIS — I25119 Atherosclerotic heart disease of native coronary artery with unspecified angina pectoris: Secondary | ICD-10-CM | POA: Diagnosis not present

## 2023-01-16 DIAGNOSIS — M25569 Pain in unspecified knee: Secondary | ICD-10-CM | POA: Diagnosis not present

## 2023-01-16 DIAGNOSIS — E669 Obesity, unspecified: Secondary | ICD-10-CM | POA: Diagnosis not present

## 2023-01-16 DIAGNOSIS — I739 Peripheral vascular disease, unspecified: Secondary | ICD-10-CM | POA: Diagnosis not present

## 2023-01-16 DIAGNOSIS — M25531 Pain in right wrist: Secondary | ICD-10-CM | POA: Diagnosis not present

## 2023-01-16 DIAGNOSIS — Z79899 Other long term (current) drug therapy: Secondary | ICD-10-CM | POA: Diagnosis not present

## 2023-01-16 DIAGNOSIS — L4050 Arthropathic psoriasis, unspecified: Secondary | ICD-10-CM | POA: Diagnosis not present

## 2023-01-16 DIAGNOSIS — I700 Atherosclerosis of aorta: Secondary | ICD-10-CM | POA: Diagnosis not present

## 2023-01-16 DIAGNOSIS — M47819 Spondylosis without myelopathy or radiculopathy, site unspecified: Secondary | ICD-10-CM | POA: Diagnosis not present

## 2023-01-16 DIAGNOSIS — E2740 Unspecified adrenocortical insufficiency: Secondary | ICD-10-CM | POA: Diagnosis not present

## 2023-01-16 DIAGNOSIS — M1990 Unspecified osteoarthritis, unspecified site: Secondary | ICD-10-CM | POA: Diagnosis not present

## 2023-01-16 DIAGNOSIS — E782 Mixed hyperlipidemia: Secondary | ICD-10-CM

## 2023-01-16 MED ORDER — BENAZEPRIL HCL 10 MG PO TABS: 10.0000 mg | ORAL_TABLET | Freq: Every day | ORAL | 3 refills | Status: DC

## 2023-01-16 NOTE — Progress Notes (Deleted)
Cardiology Office Note:  .   Date:  01/16/2023  ID:  Pamela Lowe, DOB 11-22-1950, MRN [id number] PCP: Shade Flood, MD  [facility] Providers Cardiologist:  Lesleigh Noe, MD (Inactive) { Click to update primary MD,subspecialty MD or APP then REFRESH:1}   History of Present Illness: .   Pamela Lowe is a 72 y.o. female with CAD status post LAD PCI, hypertension, hyperlipidemia, and PAD.  Pamela Lowe underwent CTA and FFR 6 of 2022 that revealed significant stenosis in the LAD.  She subsequently underwent cardiac catheterization which revealed complete occlusion of the mid LAD 85% stenosis of D2, 50% stenosis of D1, and 65% stenosis of the mid RCA with 90% occlusion of the PDA.  She underwent PCI of the CTO in the LAD as well as the D2 lesion the following week.  She has a history of meningioma that was [facility] at [facility].  She last saw Dr. Katrinka Blazing 05/2021 and was doing well.  She saw Wallis Bamberg, NP 03/2022 and was concerned about lbruising and bleeding.  She underwent upper and lower endoscopy 12/2021, which were unremarkable. She was also struggling with nosebleeds.  Hydrochlorothiazide was discontinued by her PCP but subsequently restarted 12/2022 due to persistently elevated BP.    ROS: ***  Studies Reviewed: .        *** Risk Assessment/Calculations:   {Does this patient have ATRIAL FIBRILLATION?:[phone]} No BP recorded.  {Refresh Note OR Click here to enter BP  :1}***       Physical Exam:   VS:  LMP 03/19/1998 Comment: h/o supracervical hysterectomy   Wt Readings from Last 3 Encounters:  01/07/23 194 lb (88 kg)  10/30/22 193 lb (87.5 kg)  10/08/22 204 lb 2 oz (92.6 kg)    GEN: Well nourished, well developed in no acute distress NECK: No JVD; No carotid bruits CARDIAC: ***RRR, no murmurs, rubs, gallops RESPIRATORY:  Clear to auscultation without rales, wheezing or rhonchi  ABDOMEN: Soft, non-tender, non-distended EXTREMITIES:  No  edema; No deformity   ASSESSMENT AND PLAN: .   ***    {Are you ordering a CV Procedure (e.g. stress test, cath, DCCV, TEE, etc)?   Press F2        :[id number]}  Dispo: ***  Signed, Chilton Si, MD

## 2023-01-16 NOTE — Patient Instructions (Signed)
Medication Instructions:  DECREASE YOUR BENAZEPRIL TO 10 MG DAILY   *If you need a refill on your cardiac medications before your next appointment, please call your pharmacy*  Lab Work: FASTING LP/CMET IN [place] OR FEBRUARY   If you have labs (blood work) drawn today and your tests are completely normal, you will receive your results only by: MyChart Message (if you have MyChart) OR A paper copy in the mail If you have any lab test that is abnormal or we need to change your treatment, we will call you to review the results.  Testing/Procedures: NONE  Follow-Up: At [facility], you and your health needs are our priority.  As part of our continuing mission to provide you with exceptional heart care, we have created designated Provider Care Teams.  These Care Teams include your primary Cardiologist (physician) and Advanced Practice Providers (APPs -  Physician Assistants and Nurse Practitioners) who all work together to provide you with the care you need, when you need it.  We recommend signing up for the patient portal called "MyChart".  Sign up information is provided on this After Visit Summary.  MyChart is used to connect with patients for Virtual Visits (Telemedicine).  Patients are able to view lab/test results, encounter notes, upcoming appointments, etc.  Non-urgent messages can be sent to your provider as well.   To learn more about what you can do with MyChart, go to [email or web address].    Your next appointment:   6 month(s)  Provider:   Chilton Si, MD or Gillian Shields, NP    Other Instructions MONITOR AND LOG YOUR BLOOD PRESSURE TWICE A DAY FOR 2 WEEKS, SEND READINGS VIA MYCHART OR CALL IN

## 2023-01-16 NOTE — Progress Notes (Signed)
Cardiology Office Note:  .    Date:  01/16/2023  ID:  Pamela Lowe, DOB December 30, 1950, MRN [id number] PCP: Shade Flood, MD  [facility] Providers Cardiologist:  Lesleigh Noe, MD (Inactive)     History of Present Illness: .    Pamela Lowe is a 72 y.o. female with CAD s/p LAD PCI 12/2018, hypertension, hyperlipidemia, PAD, who presents for follow-up and to establish care with me. Ms. Pamela Lowe underwent CTA and FFR 08/2018 that revealed significant stenosis in the LAD.  She subsequently underwent cardiac catheterization which revealed complete occlusion of the mid LAD 85% stenosis of D2, 50% stenosis of D1, and 65% stenosis of the mid RCA with 90% occlusion of the PDA.  She underwent PCI of the CTO in the LAD as well as the D2 lesion the following week.   She has a history of meningioma that was removed at [facility] in 01/2021. She last saw Dr. Katrinka Blazing 05/2021 and was doing well. She saw Wallis Bamberg, NP 03/2022 and was concerned about bruising and bleeding. She underwent upper and lower endoscopy 12/2021, which were unremarkable. She was also struggling with nosebleeds. Hydrochlorothiazide was discontinued by her PCP but subsequently restarted 12/2022 due to persistently elevated BP.   Today, she reports feeling great with no new cardiovascular complaints. EKG performed today shows sinus bradycardia at 55 bpm, with LAFB. In the office her blood pressure is 96/58 initially, and manual recheck shows 114/70 in left arm, 112/68 in right arm. She has had some similar readings at home in the 90s systolic, but had previously thought her BP cuff was inaccurate. More often she has home readings closer to 120s systolic. Last year she had noticed blood pressures in the 90s after she worked out, so HCTZ had been discontinued. However, she confirms that her blood pressure had been increasing as she developed more frequent headaches, so the HCTZ was restarted. Occasionally she  experiences some central/right chest pain, although nothing sustained. At one time this occurred as she woke up in the morning. She does have other known acid reflux symptoms, and she has started taking her Pepcid more frequently. She typically sleeps on an incline; has an adjustable bed. She continues to work with [facility]. Generally she has been eating healthier and has lost a total of 45 lbs so far with a goal of losing 20 more lbs.  Emphasizing more protein and less carbs. Monitoring saturated fats, cholesterol, etc. She also switched to decaf coffee, one cup in the mornings. For exercise she has been walking around [facility], and rides her stationary bike for 30 minutes at a time. She is working up to exercising 5 days a week. She has been exercising more routinely without anginal symptoms. She denies any palpitations, shortness of breath, peripheral edema, lightheadedness, syncope, orthopnea, or PND.  ROS:  Please see the history of present illness. All other systems are reviewed and negative.  (+) Intermittent chest pain  (+) Acid reflux  Studies Reviewed: [name]   EKG Interpretation Date/Time:  Wednesday January 16 2023 09:21:02 EDT Ventricular Rate:  55 PR Interval:  168 QRS Duration:  98 QT Interval:  406 QTC Calculation: 388 R Axis:   -62  Text Interpretation: Sinus bradycardia Left anterior fascicular block Possible Anterior infarct , age undetermined No significant change since last tracing Confirmed by Chilton Si ([id number]) on 01/16/2023 9:28:50 AM    CT Chest  08/02/2022: IMPRESSION: 1. Lung-RADS 2,  benign appearance or behavior. Continue annual screening with low-dose chest CT without contrast in 12 months. 2. Three-vessel coronary atherosclerosis. 3. Left colonic diverticulosis. 4. Aortic Atherosclerosis (ICD10-I70.0) and Emphysema (ICD10-J43.9).  Coronary Stent Intervention  12/26/2018: Successful stent implantation in the mid segment of the second  diagonal reducing 95% stenosis to 0% with TIMI grade III flow using a 2.0 x 18 Onyx postdilated the 2.25 mm in diameter.   Successful overlapping stent implantation in the mid to distal LAD using a 2.5 x 26 stent proximally overlapping a 22 x 2.5 mm stent more distally all postdilated to 2.75 mm.  Total occlusion with TIMI grade 0 flow was reduced to 0% with TIMI grade III flow.   RECOMMENDATIONS:   Continue aspirin and clopidogrel indefinitely Monitor symptoms for continued angina and if present perhaps the more proximal eccentric disease that involves the second diagonal may need therapy with PCI. Aggressive preventive therapy aiming to get LDL less than 55. Discharge in a.m. if no femoral artery complications or other issues overnight.  Left Heart Catheterization  12/17/2018: Obstructive coronary artery disease with complex mid LAD diagonal obstruction.  There is functional total occlusion of the LAD beyond the second septal perforator and second diagonal.  The mid and apical LAD is supplied by collaterals from right to left.  The second diagonal contains segmental 90% stenosis.  The LAD proximal to the total occlusion contains diffuse disease extending proximal to the origin of the second diagonal and second septal perforator.  A percutaneous approach would cover the ostia of these 2 large branches. Widely patent circumflex with 3 large obtuse marginals. Diffusely diseased RCA with 40 to 60% stenosis throughout the mid segment.  PDA is relatively small but contains greater than 90% ostial narrowing.  The very distal end of the right coronary beyond the first left ventricular branch appears to be totally occluded. Normal left ventricular function with LVEF 60%.  LVEDP normal.   RECOMMENDATIONS:   Treatment options include percutaneous intervention with therapy on the mid diagonal only versus extensive stenting of the LAD stent total occlusion but stents would JL a large diagonal and septal  perforator.  Considering the disease in the right coronary uppercase could also be made to consider coronary bypass grafting.  Will discuss with colleagues.  Risk Assessment/Calculations:             Physical Exam:    VS:  BP 112/68 (BP Location: Right Arm, Patient Position: Sitting, Cuff Size: Large)   Pulse (!) 55   Ht 5' 6.14" (1.68 m)   Wt 198 lb (89.8 kg)   LMP 03/19/1998 Comment: h/o supracervical hysterectomy  SpO2 97%   BMI 31.82 kg/m  , BMI Body mass index is 31.82 kg/m. GENERAL:  Well appearing HEENT: Pupils equal round and reactive, fundi not visualized, oral mucosa unremarkable NECK:  No jugular venous distention, waveform within normal limits, carotid upstroke brisk and symmetric, no bruits, no thyromegaly LUNGS:  Clear to auscultation bilaterally HEART:  RRR.  PMI not displaced or sustained,S1 and S2 within normal limits, no S3, no S4, no clicks, no rubs, no murmurs ABD:  Flat, positive bowel sounds normal in frequency in pitch, no bruits, no rebound, no guarding, no midline pulsatile mass, no hepatomegaly, no splenomegaly EXT:  2 plus pulses throughout, no edema, no cyanosis no clubbing SKIN:  No rashes no nodules NEURO:  Cranial nerves II through XII grossly intact, motor grossly intact throughout PSYCH:  Cognitively intact, oriented to person place and time  Wt Readings from Last 3 Encounters:  01/16/23 198 lb (89.8 kg)  01/07/23 194 lb (88 kg)  10/30/22 193 lb (87.5 kg)     ASSESSMENT AND PLAN: .    Assessment and Plan    # Hypertension Recent increase in blood pressure with associated headaches. Patient has resumed hydrochlorothiazide and blood pressure has improved. However, there are concerns about fluctuations in blood pressure readings.  Several readings have been in the 90s, including her initial reading today.   -Reduce Benazepril to 10mg  daily to maintain a more stable regimen.  Continue HCTZ and metoprolol.  -Check blood pressure twice daily for two  weeks and record readings. -Report readings via MyChart for further adjustments if necessary.  # Hyperlipidemia LDL slightly elevated in July, but good in January. Patient has made significant lifestyle changes including diet and exercise. -Recheck lipid profile in January/February 2025. -Consider medication adjustments if LDL is not under 70.  Ideally she will be <55 given her CAD and cerebral microvascular disease.   # Chest Pain # CAD s/p PCI:  Intermittent chest pain reported, but not associated with exercise. No other symptoms of cardiac ischemia. -Continue current medications including aspirin, clopidogrel, metoprolol, rosuvastatin, Zetia, and fish oil. -Continue monitoring for symptoms. -Encouraged her to increase her exercise.   #Gastroesophageal Reflux Disease (GERD) Patient reports symptoms of acid reflux, which may be contributing to chest discomfort. -Continue current management with Pepcid as needed. -Ensure patient is sleeping on an incline to reduce nighttime symptoms.  # History of Brain Tumor Patient had a meningioma removed in 2022. Recent MRI showed new white matter changes, and patient is scheduled to see a neurologist. -Continue current follow-up plan with neurology.  Follow-up in 6 months.        Dispo:  FU with Jeffery Gammell C. Duke Salvia, MD, [facility] in 6 months.  I,Mathew Stumpf,acting as a Neurosurgeon for Chilton Si, MD.,have documented all relevant documentation on the behalf of Chilton Si, MD,as directed by  Chilton Si, MD while in the presence of Chilton Si, MD.  I, Jaqua Ching C. Duke Salvia, MD have reviewed all documentation for this visit.  The documentation of the exam, diagnosis, procedures, and orders on 01/16/2023 are all accurate and complete.   Signed, Chilton Si, MD

## 2023-01-17 ENCOUNTER — Ambulatory Visit: Payer: Medicare Other | Admitting: Neurology

## 2023-01-17 ENCOUNTER — Encounter: Payer: Self-pay | Admitting: Neurology

## 2023-01-17 VITALS — BP 111/74 | HR 60 | Ht 66.0 in | Wt 196.5 lb

## 2023-01-17 DIAGNOSIS — D329 Benign neoplasm of meninges, unspecified: Secondary | ICD-10-CM

## 2023-01-17 DIAGNOSIS — F32A Depression, unspecified: Secondary | ICD-10-CM

## 2023-01-17 DIAGNOSIS — R413 Other amnesia: Secondary | ICD-10-CM | POA: Diagnosis not present

## 2023-01-17 DIAGNOSIS — E230 Hypopituitarism: Secondary | ICD-10-CM

## 2023-01-17 DIAGNOSIS — H5340 Unspecified visual field defects: Secondary | ICD-10-CM | POA: Diagnosis not present

## 2023-01-17 NOTE — Progress Notes (Signed)
[facility]  PATIENT: Pamela Lowe DOB: [birth year]  REFERRING DOCTOR OR PCP: Gildardo Cranker MD; Jeri Cos, PA-C; Dr. Thomasena Edis [facility]) SOURCE: Patient, notes from PCP, imaging reports, MRI images personally reviewed.  _________________________________   HISTORICAL  CHIEF COMPLAINT:  Chief Complaint  Patient presents with   Follow-up    Rm 11. Alone. Patient is doing well. She would like to discuss MRI from [facility] 10/07. C/o worsening memory.    HISTORY OF PRESENT ILLNESS:  Pamela Lowe is a 72 y.o. woman with a suprasellar meningioma (resected 02/24/2021), , visual changes and shoulder and arm pain and left arm numbness.  Update 01/17/23: She feels memory is worse.  However, she scored 28/30 on the MoCA which is normal.    (Actually did better than last year).  She had repeat imaging at [facility] to f/u the tumor.    It showed:  1. Stable postoperative appearance status post transsphenoidal resection of sellar/suprasellar/planum sphenoidale meningioma. No evidence of disease progression. Severe chronic microvascular ischemic changes of the cerebral white matter, similar to prior.  ---- I personally reviewed the images on her phone and no significant change in WM change.  Brain volume is Normal for age  VF is stable with bi-temporal field cuts.   She will be seeing a neuro-ophthalmologist as well.   She is on acetazolamide pills and eye drops.    She is also reporting memory issues ad feels it is worse than last year.[name]   MoCA today was 28/30  Memory is better with hints in general.  Advised to stay physically and cognitively active.  She is sleeping well most nights usually 7 hours.   She has not been told that she snores.   She wakes up just 0-1 tines a noght (nocturia).      She quickly falls back asleep.  She feels mood is stable and occasionally feels down.   Still accomplishes daily goals.   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      She has moderate chronic microvascular ischemic change.  She has HTN (since 2005), former smoker [id number], much of it 2 ppd), (also has prediabetes and adrenal insufficient since surgery)  She has RA and is on Simponi and MTX.   Shoulder pain and  arm pain I beter.      She had a cervical spine MRI performed 08/25/2020 that shows severe bilateral neural foraminal narrowing at C4C5 affecting the C5 nerve roots and changes at C5-C6 that could affect the left C6 nerve root.  At the last visit, we did left subacromial bursa injection and pain has been much better since.  She feels range of motion is normal in the shoulder now.  Tumor history: I had noticed a sellar mass on the cervical spine MRI in 2022 and a pituitary MRI 10/26/2020 was performed confirming the mass and that it was compressing the optic nerves.  She had the suprasellar mass resected at [facility] (02/24/2021 Dr. Luiz Iron - [facility], [facility] Amor - ENT).  It turned out o be a suprasellar meningioma.   It was displacing the optic nerves.   S Because she has uveitis and glaucoma, visual changes were felt to be related to her existing problems initially.  The pathology was meningioma WHO grade I.        01/17/2023   10:48 AM 08/10/2021   10:59 AM 09/21/2016   10:41 AM  [place]  Cognitive Assessment   Visuospatial/ Executive (0/5) 5 4 5   Naming (0/3) 3 3 3   Attention: Read list of digits (0/2) 2 1 2   Attention: Read list of letters (0/1) 1 1 1   Attention: Serial 7 subtraction starting at 100 (0/3) 3 2 3   Language: Repeat phrase (0/2) 2 2 2   Language : Fluency (0/1) 0 1 1  Abstraction (0/2) 2 2 2   Delayed Recall (0/5) 4 2 5   Orientation (0/6) 6 5 6   Total 28 23 30   Adjusted Score (based on education) 28      Imaging reviewed today MRI of the brain 06/02/2021 (performed at [facility]) was personally reviewed.  The pituitary gland is just minimally enlarged on the right and there is no impingement of  the optic nerves.  The brain shows moderate chronic microvascular ischemic changes, essentially unchanged compared to the previous MRI.[name]  Brain volume was normal for age.  MRI of the brain 10/26/2020 showed 25 x 21 x 15 mm sellar/suprasellar mass upwardly displacing the optic nerves.  It has fairly homogenous enhancement after contrast.  This is consistent with a pituitary macroadenoma.     Extensive T2/FLAIR hyperintense foci in the hemispheres, right thalamus and a few foci in the brainstem and cerebellum consistent with moderate chronic microvascular ischemic changes   MRI of the cervical spine 08/25/2020.  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  The spine shows large disc osteophyte complexes at C4-C5 and C5-C6.  At C4-C5, there is probable left and possible right C5 nerve root compression.  At C5-C6, there is potential left C6 nerve root compression.  There is also minimal retrolisthesis of C5 upon C6.  There are milder degenerative changes at C6-C7 more to the left that probably do not cause any nerve root compression.   REVIEW OF SYSTEMS: Constitutional: No fevers, chills, sweats, or change in appetite Eyes: No visual changes, double vision, eye pain Ear, nose and throat: No hearing loss, ear pain, nasal congestion, sore throat Cardiovascular: No chest pain, palpitations Respiratory:  No shortness of breath at rest or with exertion.   No wheezes GastrointestinaI: No nausea, vomiting, diarrhea, abdominal pain, fecal incontinence Genitourinary:  No dysuria, urinary retention or frequency.  No nocturia. Musculoskeletal:  No neck pain, back pain Integumentary: No rash, pruritus, skin lesions Neurological: as above Psychiatric: No depression at this time.  No anxiety Endocrine: No palpitations, diaphoresis, change in appetite, change in weigh or increased thirst Hematologic/Lymphatic:  No anemia, purpura, petechiae. Allergic/Immunologic: No itchy/runny eyes, nasal  congestion, recent allergic reactions, rashes  ALLERGIES: Allergies  Allergen Reactions   Sulfamethoxazole-Trimethoprim Other (See Comments) and Rash    Chills/achy Flu like symptoms    Conjugated Estrogens     Other reaction(s): rash   Wellbutrin [Bupropion Hcl] Rash    rash    HOME MEDICATIONS:  Current Outpatient Medications:    aspirin 81 MG EC tablet, Take 81 mg by mouth every evening. , Disp: , Rfl:    B Complex Vitamins (VITAMIN B COMPLEX) TABS, Take 1 tablet by mouth daily., Disp: , Rfl:    benazepril (LOTENSIN) 10 MG tablet, Take 1 tablet (10 mg total) by mouth daily., Disp: 90 tablet, Rfl: 3   brimonidine (ALPHAGAN) 0.2 % ophthalmic solution, Place 1 drop into both eyes 3 (three) times daily. , Disp: , Rfl:    clopidogrel (PLAVIX) 75 MG tablet, Take 1 tablet (75 mg total) by mouth daily., Disp: 90 tablet, Rfl: 1   dorzolamide-timolol (COSOPT)  22.3-6.8 MG/ML ophthalmic solution, Place 1 drop into the right eye 2 (two) times daily., Disp: , Rfl:    ezetimibe (ZETIA) 10 MG tablet, TAKE 1 TABLET BY MOUTH DAILY, Disp: 90 tablet, Rfl: 3   folic acid (FOLVITE) 1 MG tablet, Take 1 mg by mouth daily. , Disp: , Rfl:    hydrochlorothiazide (MICROZIDE) 12.5 MG capsule, Take 1 capsule (12.5 mg total) by mouth daily., Disp: 30 capsule, Rfl: 1   hydrocortisone (CORTEF) 10 MG tablet, Take 10 mg by mouth daily., Disp: , Rfl:    hyoscyamine (LEVSIN SL) 0.125 MG SL tablet, DISSOLVE 1 TO 2 TABLETS IN MOUTH UNDER THE TONGUE TWICE DAILY AS NEEDED, Disp: 50 tablet, Rfl: 1   methotrexate (RHEUMATREX) 2.5 MG tablet, Take by mouth once a week. Mondays Take 6 tablets, Disp: , Rfl:    metoprolol succinate (TOPROL-XL) 25 MG 24 hr tablet, TAKE 1 TABLET BY MOUTH DAILY, Disp: 90 tablet, Rfl: 1   minoxidil (LONITEN) 2.5 MG tablet, Take 2.5 mg by mouth daily., Disp: , Rfl:    Multiple Vitamins-Minerals (MULTIVITAMIN WITH MINERALS) tablet, Take 1 tablet by mouth daily., Disp: , Rfl:    Omega-3 Fatty Acids  (FISH OIL) 1000 MG CAPS, Take 1 capsule by mouth daily. , Disp: , Rfl:    rosuvastatin (CRESTOR) 20 MG tablet, TAKE 1 TABLET BY MOUTH DAILY, Disp: 90 tablet, Rfl: 3   SIMPONI ARIA 50 MG/4ML SOLN injection, Inject 50 mg into the vein. Every 60 Days, Disp: , Rfl:   PAST MEDICAL HISTORY: Past Medical History:  Diagnosis Date   Allergic rhinitis    Allergy    Arthritis    Brain tumor (HCC)    Breast cyst 2007   Right   Cataract    surgery 07/2017   Clotting disorder (HCC)    On plavix since stents  for artery blockages   Depression 2006   w/ menopause   Depression screening 09/27/2021   Family history of adverse reaction to anesthesia    sister PONV   Family history of breast cancer    Family history of colon cancer    Family history of kidney cancer 01/15/2018   Family history of lung cancer    Family history of pancreatic cancer    Family history of prostate cancer    Family history of uterine cancer    GERD (gastroesophageal reflux disease)    Glaucoma    Heart murmur    History of hysterectomy, supracervical    Hyperlipidemia    Hypertension    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   MLH1 [id number] (Splice site)   Multinodular goiter    PAD (peripheral artery disease) (HCC)    LE Art [place] 1/18: Occluded prox-mid R SFA // ABIs 9/22: R 0.83; L 1.0   Pustular psoriasis    Tobacco user     PAST SURGICAL HISTORY: Past Surgical History:  Procedure Laterality Date   ABDOMINAL HYSTERECTOMY  1995   BIOPSY  01/15/2022   Procedure: BIOPSY;  Surgeon: Lemar Lofty., MD;  Location: [facility];  Service: Gastroenterology;;   BRAIN TUMOR EXCISION  02/24/2021   at [facility]   BREAST BIOPSY Bilateral 1985   CARDIAC CATHETERIZATION     CATARACT EXTRACTION Right 07/2017   COLONOSCOPY     COLONOSCOPY WITH PROPOFOL N/A 01/15/2022   Procedure: COLONOSCOPY WITH PROPOFOL;  Surgeon: Lemar Lofty., MD;  Location: [facility];  Service: Gastroenterology;  Laterality:  N/A;   CORONARY STENT INTERVENTION N/A 12/26/2018  Procedure: CORONARY STENT INTERVENTION;  Surgeon: Lyn Records, MD;  Location: [facility] INVASIVE CV LAB;  Service: Cardiovascular;  Laterality: N/A;   ESOPHAGOGASTRODUODENOSCOPY (EGD) WITH PROPOFOL N/A 01/15/2022   Procedure: ESOPHAGOGASTRODUODENOSCOPY (EGD) WITH PROPOFOL;  Surgeon: Meridee Score Netty Starring., MD;  Location: [facility];  Service: Gastroenterology;  Laterality: N/A;   EYE SURGERY  2019, 2020   Cataracts, eye shunt implants   LEFT HEART CATH AND CORONARY ANGIOGRAPHY N/A 12/17/2018   Procedure: LEFT HEART CATH AND CORONARY ANGIOGRAPHY;  Surgeon: Lyn Records, MD;  Location: [facility] INVASIVE CV LAB;  Service: Cardiovascular;  Laterality: N/A;   POLYPECTOMY  01/15/2022   Procedure: POLYPECTOMY;  Surgeon: Meridee Score Netty Starring., MD;  Location: [facility];  Service: Gastroenterology;;   SUPRACERVICAL ABDOMINAL HYSTERECTOMY  1986   h/o supracervical hysterectomy   UPPER GASTROINTESTINAL ENDOSCOPY      FAMILY HISTORY: Family History  Problem Relation Age of Onset   Hypertension Mother    Deep vein thrombosis Mother    Alzheimer's disease Mother    Osteopenia Mother    Arthritis Mother    Varicose Veins Mother    Kidney cancer Father 20   Pneumonia Father    Hypertension Father    Arthritis Father    Early death Father    Kidney disease Father    Breast cancer Sister 51       recurrence 75   Cancer Sister    Breast cancer Sister 105       Stage 0   Cancer Sister    Breast cancer Sister 87       Stage 3, bilateral mastectomy   Cancer Sister    Early death Sister    Deep vein thrombosis Sister            Allergies Brother    Early death Brother    Heart attack Brother 66   Breast cancer Maternal Aunt 67   Prostate cancer Maternal Uncle 68   Lung cancer Maternal Uncle 60   Pancreatic cancer Paternal Aunt 76   Pancreatic cancer Paternal Aunt 8   Brain cancer Paternal Aunt    Pancreatic cancer Paternal Uncle 24    Lung cancer Paternal Uncle 65   Heart attack Maternal Grandfather    Uterine cancer Paternal Grandmother 58   Colon cancer Paternal Grandfather 15   Kidney cancer Cousin 39   Kidney cancer Cousin 52   Uterine cancer Cousin 62   Prostate cancer Cousin 57   Breast cancer Cousin 60   Thyroid disease Neg Hx    Rectal cancer Neg Hx    Stomach cancer Neg Hx    Esophageal cancer Neg Hx    Inflammatory bowel disease Neg Hx    Liver disease Neg Hx     SOCIAL HISTORY:  Social History   Socioeconomic History   Marital status: Married    Spouse name: Tasia Catchings   Number of children: 0   Years of education: Not on file   Highest education level: Master's degree (e.g., MA, MS, MEng, MEd, MSW, MBA)  Occupational History   Occupation: Special Ed Teacher    Comment: Dentist  Tobacco Use   Smoking status: Former    Current packs/day: 0.00    Average packs/day: 0.3 packs/day for 36.0 years (9.0 ttl pk-yrs)    Types: Cigarettes    Start date: 08/01/1981    Quit date: 08/01/2017    Years since quitting: 5.4   Smokeless tobacco: Never   Tobacco comments:  Started smoking at about 72 yrs old  Vaping Use   Vaping status: Never Used  Substance and Sexual Activity   Alcohol use: Not Currently   Drug use: No   Sexual activity: Not Currently    Partners: Male    Birth control/protection: Post-menopausal, Surgical    Comment: Hysterectomy  Other Topics Concern   Not on file  Social History Narrative   Not on file   Social Determinants of Health   Financial Resource Strain: Low Risk  (08/16/2022)   Overall Financial Resource Strain (CARDIA)    Difficulty of Paying Living Expenses: Not hard at all  Food Insecurity: No Food Insecurity (08/16/2022)   Hunger Vital Sign    Worried About Running Out of Food in the Last Year: Never true    Ran Out of Food in the Last Year: Never true  Transportation Needs: No Transportation Needs (08/16/2022)   PRAPARE - Scientist, research (physical sciences) (Medical): No    Lack of Transportation (Non-Medical): No  Physical Activity: Sufficiently Active (08/03/2021)   Exercise Vital Sign    Days of Exercise per Week: 6 days    Minutes of Exercise per Session: 30 min  Stress: No Stress Concern Present (08/16/2022)   [employer] of Occupational Health - Occupational Stress Questionnaire    Feeling of Stress : Not at all  Social Connections: Moderately Integrated (08/16/2022)   Social Connection and Isolation Panel [NHANES]    Frequency of Communication with Friends and Family: Three times a week    Frequency of Social Gatherings with Friends and Family: Once a week    Attends Religious Services: Never    Database administrator or Organizations: Yes    Attends Engineer, structural: More than 4 times per year    Marital Status: Married  Catering manager Violence: Not At Risk (08/16/2022)   Humiliation, Afraid, Rape, and Kick questionnaire    Fear of Current or Ex-Partner: No    Emotionally Abused: No    Physically Abused: No    Sexually Abused: No     PHYSICAL EXAM  Vitals:   01/17/23 1047  BP: 111/74  Pulse: 60  Weight: 196 lb 8 oz (89.1 kg)  Height: 5\' 6"  (1.676 m)    Body mass index is 31.72 kg/m.   General: The patient is well-developed and well-nourished and in no acute distress  HEENT:  Head is [place]/AT.  Sclera are anicteric.     Neck: No carotid bruits are noted.  The neck is nontender.  Cardiovascular: The heart has a regular rate and rhythm with a normal S1 and S2. There were no murmurs, gallops or rubs.    Skin: Extremities are without rash or  edema.  Musculoskeletal: The neck was fairly nontender but there was reduced range of motion.  She had some tenderness over the subacromial bursa of the left shoulder and had a reduced range of motion.  Neurologic Exam  Mental status: The patient is alert and oriented x 3 at the time of the examination. The patient has apparent normal recent and  remote memory, with an apparently normal attention span and concentration ability.   Speech is normal.  Cranial nerves: Extraocular movements are full.  She has greatly reduced temporal visual field in the upper and lower quadrant on the right and to a lesser extent on the left.   Facial strength and sensation are fine.  . No obvious hearing deficits are noted.  Motor:  Muscle bulk is normal.   Tone is normal.  Strength was 4+/5 in the left biceps and deltoid and 5/5 elsewhere in the left arm.  Strength is  5 / 5 in the right arm and both legs.  Sensory: Sensory testing is intact to pinprick, soft touch and vibration sensation in all 4 extremities.  Coordination: Cerebellar testing reveals good finger-nose-finger and heel-to-shin bilaterally.  Gait and station: Station is normal.   The gait was normal.  Tandem gait was mildly wide but likely normal for age.  Romberg is negative.   Reflexes: Deep tendon reflexes are symmetric and normal bilaterally.       DIAGNOSTIC DATA (LABS, IMAGING, TESTING) - I reviewed patient records, labs, notes, testing and imaging myself where available.  Lab Results  Component Value Date   WBC 9.8 03/27/2022   HGB 14.2 03/27/2022   HCT 43.2 03/27/2022   MCV 92.6 03/27/2022   PLT 319.0 03/27/2022      Component Value Date/Time   NA 137 01/14/2023 0913   K 4.1 01/14/2023 0913   CL 102 01/14/2023 0913   CO2 26 01/14/2023 0913   GLUCOSE 103 (H) 01/14/2023 0913   GLUCOSE 107 (H) 10/09/2022 1008   BUN 22 01/14/2023 0913   CREATININE 1.15 (H) 01/14/2023 0913   CREATININE 1.41 (H) 12/16/2012 1554   CALCIUM 9.6 01/14/2023 0913   PROT 7.1 10/09/2022 1008   PROT 6.4 09/27/2021 0900   ALBUMIN 4.1 10/09/2022 1008   ALBUMIN 3.9 09/27/2021 0900   AST 50 (H) 10/09/2022 1008   ALT 45 (H) 10/09/2022 1008   ALKPHOS 61 10/09/2022 1008   BILITOT 0.9 10/09/2022 1008   BILITOT 0.8 09/27/2021 0900   GFRNONAA 53 (L) 11/02/2019 1814   GFRAA >60 11/02/2019 1814    Lab Results  Component Value Date   CHOL 162 10/09/2022   HDL 78.50 10/09/2022   LDLCALC 73 10/09/2022   TRIG 56.0 10/09/2022   CHOLHDL 2 10/09/2022   Lab Results  Component Value Date   HGBA1C 5.7 10/09/2022   Lab Results  Component Value Date   VITAMINB12 1,042 08/10/2021   Lab Results  Component Value Date   TSH 0.823 09/27/2021       ASSESSMENT AND PLAN  Meningioma (HCC)  Visual field defect  Hypopituitarism (HCC)  Memory loss  Depression, unspecified depression type   She will continue to  f/u at [facility] for suprasellar meningioma and visual field issues.   The pituitary gland is now normal size and there is no pressure on the optic nerves Shoulder and neck are doing well --- if worsens can repear subacromial bursa injeciton.    The reduced memory appears to be due to reduced focus not to reduce storage.   There is no evidence of Alzheimer's disease.  No temporal lobe atrophy on imaging studies.  However, she does have extensive white matter changes consistent with chronic microvascular ischemic change that may be playing a role along with her endocrine and mood issues.  Advised to continue to stay physically active, mentally active and to eat a well-balanced heart healthy diet She will f/u 1 year or as needed   Idan Prime A. Epimenio Foot, MD, [facility] 01/17/2023, 1:08 PM Certified in Neurology, Clinical Neurophysiology, Sleep Medicine and Neuroimaging  [facility] [address], [address] [place], [place] [address] [phone]

## 2023-01-20 ENCOUNTER — Encounter (HOSPITAL_BASED_OUTPATIENT_CLINIC_OR_DEPARTMENT_OTHER): Payer: Self-pay | Admitting: Cardiovascular Disease

## 2023-01-21 ENCOUNTER — Other Ambulatory Visit: Payer: Self-pay | Admitting: Cardiology

## 2023-01-21 MED ORDER — HYDROCHLOROTHIAZIDE 12.5 MG PO CAPS: 12.5000 mg | ORAL_CAPSULE | Freq: Every day | ORAL | 3 refills | Status: DC

## 2023-01-21 MED ORDER — CLOPIDOGREL BISULFATE 75 MG PO TABS: 75.0000 mg | ORAL_TABLET | Freq: Every day | ORAL | 3 refills | Status: AC

## 2023-01-21 MED ORDER — METOPROLOL SUCCINATE ER 25 MG PO TB24: 25.0000 mg | ORAL_TABLET | Freq: Every day | ORAL | 3 refills | Status: DC

## 2023-01-21 NOTE — Addendum Note (Signed)
Addended by: Marlene Lard on: 01/21/2023 10:30 AM   Modules accepted: Orders

## 2023-01-24 DIAGNOSIS — N2581 Secondary hyperparathyroidism of renal origin: Secondary | ICD-10-CM | POA: Diagnosis not present

## 2023-01-24 DIAGNOSIS — M069 Rheumatoid arthritis, unspecified: Secondary | ICD-10-CM | POA: Diagnosis not present

## 2023-01-24 DIAGNOSIS — N1831 Chronic kidney disease, stage 3a: Secondary | ICD-10-CM | POA: Diagnosis not present

## 2023-01-24 DIAGNOSIS — I129 Hypertensive chronic kidney disease with stage 1 through stage 4 chronic kidney disease, or unspecified chronic kidney disease: Secondary | ICD-10-CM | POA: Diagnosis not present

## 2023-01-25 ENCOUNTER — Encounter (HOSPITAL_BASED_OUTPATIENT_CLINIC_OR_DEPARTMENT_OTHER): Payer: Self-pay | Admitting: Cardiovascular Disease

## 2023-01-28 ENCOUNTER — Ambulatory Visit (INDEPENDENT_AMBULATORY_CARE_PROVIDER_SITE_OTHER): Payer: Medicare Other | Admitting: Family Medicine

## 2023-01-28 ENCOUNTER — Encounter (INDEPENDENT_AMBULATORY_CARE_PROVIDER_SITE_OTHER): Payer: Self-pay | Admitting: Family Medicine

## 2023-01-28 VITALS — BP 123/76 | HR 70 | Temp 98.3°F | Ht 66.0 in | Wt 194.0 lb

## 2023-01-28 DIAGNOSIS — I10 Essential (primary) hypertension: Secondary | ICD-10-CM

## 2023-01-28 DIAGNOSIS — E669 Obesity, unspecified: Secondary | ICD-10-CM | POA: Diagnosis not present

## 2023-01-28 DIAGNOSIS — Z6831 Body mass index (BMI) 31.0-31.9, adult: Secondary | ICD-10-CM

## 2023-01-28 DIAGNOSIS — R7303 Prediabetes: Secondary | ICD-10-CM

## 2023-01-28 DIAGNOSIS — E559 Vitamin D deficiency, unspecified: Secondary | ICD-10-CM

## 2023-01-28 DIAGNOSIS — E66811 Obesity, class 1: Secondary | ICD-10-CM

## 2023-01-28 NOTE — Progress Notes (Signed)
Pamela Lowe, D.O.  ABFM, ABOM Specializing in Clinical Bariatric Medicine  Office located at: [address] [place], [place]  [address]     Assessment and Plan:  Review labs with pt next OV, vit D and A1c.  Orders Placed This Encounter  Procedures   VITAMIN D 25 Hydroxy (Vit-D Deficiency, Fractures)   Hemoglobin A1c   Medications Discontinued During This Encounter  Medication Reason   rosuvastatin (CRESTOR) 20 MG tablet     Essential hypertension Assessment: Condition is Controlled.[name] Pt BP is well controlled with Toprol XL 25mg , Benazepril 20mg , and hydrochlorothiazide 12.5mg . Pt is asymptomatic and denies any light-headiness or dizziness. She continues to monitor her BP and follow-up with cardiology. Pt serum creatinine is elevated at 1.15 with a BUN of 22 as of 01/14/2023.  Last 3 blood pressure readings in our office are as follows: BP Readings from Last 3 Encounters:  01/28/23 123/76  01/17/23 111/74  01/16/23 112/68   Lab Results  Component Value Date   CREATININE 1.15 (H) 01/14/2023   BUN 22 01/14/2023   NA 137 01/14/2023   K 4.1 01/14/2023   CL 102 01/14/2023   CO2 26 01/14/2023   Plan: - Continue with Toprol XL, Benazepril, and hydrochlorothiazide as directed by prescribing provider.   - Donnamae C Kadar BP is 123/76 at goal today.   - Lifestyle changes such as following our low salt, heart healthy meal plan and engaging in a regular exercise program discussed.   - We will continue to monitor closely alongside PCP/Cardiologist.  Pt reminded to also f/up with those individuals as instructed by them.   - Labs were reviewed with patient today and education provided on them. We discussed how the foods patient eats may influence these laboratory findings.  All of the patient's questions about them were answered    Vitamin D deficiency Assessment: Condition is Not optimized..Patient reports good compliance and tolerance of taking OTC vitamin D 1,000 lU  BID. She denies any adverse side effects and denies any bone pain.  Lab Results  Component Value Date   VD25OH 30.3 09/27/2021   VD25OH 40.6 08/10/2021   VD25OH 41.0 09/21/2016   Plan: - Continue OTC Cholecalciferol at current dose, BID.   - I reviewed possible symptoms of low Vitamin D:  low energy, depressed mood, muscle aches, joint aches, osteoporosis etc. with patient  - ideal vitamin D levels reviewed with patient    - We will continue to monitor levels regularly to keep levels within normal limits and prevent over supplementation.   Pre-diabetes Assessment: Condition is Not optimized.. This is diet/exercise controlled. Pt endorses still having hunger and cravings especially when eating off plan.  Lab Results  Component Value Date   HGBA1C 5.7 10/09/2022   HGBA1C 5.9 04/09/2022   HGBA1C 5.6 09/27/2021   INSULIN 12.2 09/27/2021    Plan: - Continue her prudent nutritional plan that is low in simple carbohydrates, saturated fats and trans fats to goal of 5-10% weight loss to achieve significant health benefits.  Pt encouraged to continually advance exercise and cardiovascular fitness as tolerated throughout weight loss journey.  - I reminded pt that every lbs of excess weight feels 4-6x heavier on your knees.   - Pt is to continue to decrease simple carbs/ sugars; increase fiber and proteins.  - We will recheck A1c and fasting insulin level from last check, or as deemed appropriate. Pt informs me that her PCP will be obtaining more labs sometime near  January.    TREATMENT PLAN FOR OBESITY: Obesity,current BMI 31.33 Generalized obesity wtih starting BMI 34 Assessment:  Pamela Lowe is here to discuss her progress with her obesity treatment plan along with follow-up of her obesity related diagnoses. See Medical Weight Management Flowsheet for complete bioelectrical impedance results.  Condition is not optimized. Biometric data collected today, was reviewed with patient.    Since last office visit on 01/07/2023 patient's  Muscle mass has decreased by 2lb. Fat mass has increased by 2.4lb. Total body water has increased by 3.2lb.  Counseling done on how various foods will affect these numbers and how to maximize success  Total lbs lost to date: 19 Total weight loss percentage to date: 8.92%   Plan: - Mckenley will work on following the Category 1 nutritional plan while staying within the recommended goals of 1950-1050 calories and 75+g of protein daily. .  Behavioral Intervention Additional resources provided today:  food log Evidence-based interventions for health behavior change were utilized today including the discussion of self monitoring techniques, problem-solving barriers and SMART goal setting techniques.   Regarding patient's less desirable eating habits and patterns, we employed the technique of small changes.  Pt will specifically work on: Go to [employer] 3 days a week minimum, increase protein, and measure her food intake/bring in log  for next visit.     She has agreed to Increase the intensity, frequency or duration of aerobic exercises   and continue to gradually increase the amount and intensity of exercise   FOLLOW UP: Return in about 2 weeks (around 02/11/2023).  She was informed of the importance of frequent follow up visits to maximize her success with intensive lifestyle modifications for her multiple health conditions. [name] is aware that we will review all of her lab results at our next visit together in person.  She is aware that if anything is critical/ life threatening with the results, we will be contacting her via MyChart or by my CMA will be calling them prior to the office visit to discuss acute management.    Subjective:   Chief complaint: Obesity Pamela Lowe is here to discuss her progress with her obesity treatment plan. She is on the the Category 1 Plan and states she is following her eating plan approximately 655% of the time.  She states she is walking 45-60 minutes 5 days per week and bikes 30 minutes 4 days a week.  Interval History:  Pamela Lowe is here for a follow up office visit.     Since last office visit:  Since last OV she has not been the best. Pt states that she is not eating 3 meals a day as she typically skips breakfast. She eats low sodium [place], lettuce, a slice of cheese, and Terrill Mohr bread. She eats bananas occasionally as a snack as her cardiologist recommended this for her BP. Pt did admit to having pork ribs, green bean casserole yesterday and usually eats chicken, baked fish, and frozen veggies as a typical dinner.  Pt states that she journals her food intake with her fitbit. She admits to losing more weight when she did consistently journal. Pt informed me that she loved the mediterranean meal plan but did develop gout on this.   We reviewed her meal plan and all questions were answered.   Review of Systems:  Pertinent positives were addressed with patient today.  Reviewed by clinician on day of visit: allergies, medications, problem list, medical history, surgical history, family  history, social history, and previous encounter notes.  Weight Summary and Biometrics   Weight Lost Since Last Visit: 0lb  Weight Gained Since Last Visit: 0lb   Vitals Temp: 98.3 F (36.8 C) BP: 123/76 Pulse Rate: 70 SpO2: 99 %   Anthropometric Measurements Height: 5\' 6"  (1.676 m) Weight: 194 lb (88 kg) BMI (Calculated): 31.33 Weight at Last Visit: 194lb Weight Lost Since Last Visit: 0lb Weight Gained Since Last Visit: 0lb Starting Weight: 213lb Total Weight Loss (lbs): 19 lb (8.618 kg) Peak Weight: 213lb   Body Composition  Body Fat %: 41.7 % Fat Mass (lbs): 81.2 lbs Muscle Mass (lbs): 107.6 lbs Total Body Water (lbs): 80 lbs Visceral Fat Rating : 12   Other Clinical Data Fasting: no Labs: No Today's Visit #: 11 Starting Date: 09/27/21     Objective:   PHYSICAL EXAM: Blood  pressure 123/76, pulse 70, temperature 98.3 F (36.8 C), height 5\' 6"  (1.676 m), weight 194 lb (88 kg), last menstrual period 03/19/1998, SpO2 99%. Body mass index is 31.31 kg/m.  General: Well Developed, well nourished, and in no acute distress.  HEENT: Normocephalic, atraumatic Skin: Warm and dry, cap RF less 2 sec, good turgor Chest:  Normal excursion, shape, no gross abn Respiratory: speaking in full sentences, no conversational dyspnea NeuroM-Sk: Ambulates w/o assistance, moves * 4 Psych: A and O *3, insight good, mood-full  DIAGNOSTIC DATA REVIEWED:  BMET    Component Value Date/Time   NA 137 01/14/2023 0913   K 4.1 01/14/2023 0913   CL 102 01/14/2023 0913   CO2 26 01/14/2023 0913   GLUCOSE 103 (H) 01/14/2023 0913   GLUCOSE 107 (H) 10/09/2022 1008   BUN 22 01/14/2023 0913   CREATININE 1.15 (H) 01/14/2023 0913   CREATININE 1.41 (H) 12/16/2012 1554   CALCIUM 9.6 01/14/2023 0913   GFRNONAA 53 (L) 11/02/2019 1814   GFRAA >60 11/02/2019 1814   Lab Results  Component Value Date   HGBA1C 5.7 10/09/2022   HGBA1C 5.8 (A) 04/05/2021   Lab Results  Component Value Date   INSULIN 12.2 09/27/2021   Lab Results  Component Value Date   TSH 0.823 09/27/2021   CBC    Component Value Date/Time   WBC 9.8 03/27/2022 1209   RBC 4.67 03/27/2022 1209   HGB 14.2 03/27/2022 1209   HGB 13.5 04/06/2013 1527   HCT 43.2 03/27/2022 1209   PLT 319.0 03/27/2022 1209   MCV 92.6 03/27/2022 1209   MCH 30.7 12/18/2021 1507   MCHC 33.0 03/27/2022 1209   RDW 14.4 03/27/2022 1209   Iron Studies    Component Value Date/Time   FERRITIN 28 12/18/2021 1507   Lipid Panel     Component Value Date/Time   CHOL 162 10/09/2022 1008   CHOL 148 09/27/2021 0900   TRIG 56.0 10/09/2022 1008   HDL 78.50 10/09/2022 1008   HDL 83 09/27/2021 0900   CHOLHDL 2 10/09/2022 1008   VLDL 11.2 10/09/2022 1008   LDLCALC 73 10/09/2022 1008   LDLCALC 49 09/27/2021 0900   Hepatic Function Panel      Component Value Date/Time   PROT 7.1 10/09/2022 1008   PROT 6.4 09/27/2021 0900   ALBUMIN 4.1 10/09/2022 1008   ALBUMIN 3.9 09/27/2021 0900   AST 50 (H) 10/09/2022 1008   ALT 45 (H) 10/09/2022 1008   ALKPHOS 61 10/09/2022 1008   BILITOT 0.9 10/09/2022 1008   BILITOT 0.8 09/27/2021 0900   BILIDIR 0.21 05/29/2021 1136   IBILI 0.6  12/13/2012 1058      Component Value Date/Time   TSH 0.823 09/27/2021 0900   Nutritional Lab Results  Component Value Date   VD25OH 30.3 09/27/2021   VD25OH 40.6 08/10/2021   VD25OH 41.0 09/21/2016    Attestations:   I, Clinical biochemist, acting as a Stage manager for [employer], DO., have compiled all relevant documentation for today's office visit on behalf of Thomasene Lot, DO, while in the presence of [employer], DO.  I have reviewed the above documentation for accuracy and completeness, and I agree with the above. Pamela Lowe, D.O.  The 21st Century Cures Act was signed into law in 2016 which includes the topic of electronic health records.  This provides immediate access to information in MyChart.  This includes consultation notes, operative notes, office notes, lab results and pathology reports.  If you have any questions about what you read please let us know at your next visit so we can discuss your concerns and take corrective action if need be.  We are right here with you.

## 2023-01-29 LAB — VITAMIN D 25 HYDROXY (VIT D DEFICIENCY, FRACTURES): Vit D, 25-Hydroxy: 37.7 ng/mL (ref 30.0–100.0)

## 2023-01-29 LAB — HEMOGLOBIN A1C
Est. average glucose Bld gHb Est-mCnc: 117 mg/dL
Hgb A1c MFr Bld: 5.7 % — ABNORMAL HIGH (ref 4.8–5.6)

## 2023-02-01 DIAGNOSIS — H401133 Primary open-angle glaucoma, bilateral, severe stage: Secondary | ICD-10-CM | POA: Diagnosis not present

## 2023-02-04 ENCOUNTER — Encounter: Payer: Self-pay | Admitting: Gastroenterology

## 2023-02-05 DIAGNOSIS — M1711 Unilateral primary osteoarthritis, right knee: Secondary | ICD-10-CM | POA: Diagnosis not present

## 2023-02-11 ENCOUNTER — Encounter (HOSPITAL_BASED_OUTPATIENT_CLINIC_OR_DEPARTMENT_OTHER): Payer: Self-pay | Admitting: Certified Nurse Midwife

## 2023-02-11 ENCOUNTER — Ambulatory Visit (HOSPITAL_BASED_OUTPATIENT_CLINIC_OR_DEPARTMENT_OTHER): Payer: Medicare Other | Admitting: Certified Nurse Midwife

## 2023-02-11 VITALS — BP 119/68 | HR 52 | Ht 66.0 in | Wt 203.8 lb

## 2023-02-11 DIAGNOSIS — L4050 Arthropathic psoriasis, unspecified: Secondary | ICD-10-CM | POA: Diagnosis not present

## 2023-02-11 DIAGNOSIS — Z1509 Genetic susceptibility to other malignant neoplasm: Secondary | ICD-10-CM | POA: Diagnosis not present

## 2023-02-11 DIAGNOSIS — R350 Frequency of micturition: Secondary | ICD-10-CM | POA: Diagnosis not present

## 2023-02-11 DIAGNOSIS — M059 Rheumatoid arthritis with rheumatoid factor, unspecified: Secondary | ICD-10-CM | POA: Diagnosis not present

## 2023-02-12 LAB — URINE CULTURE

## 2023-02-12 NOTE — Progress Notes (Signed)
   Subjective:     Pamela Lowe is a 72 y.o. female here to rule out UTI. Pt states that she was recently evaluated by Cardiology and was told she may  have a UTI. She currently denies dysuria, urinary frequency, hesitancy, urgency, or hematuria.   Pt also reports that she has [facility] Syndrome and typically undergoes Pelvic US every 2 years to monitor ovaries due to increased risk of ovarian cancer. Her most recent Pelvic US was 01/07/21.   Review of Systems Pertinent items are noted in HPI.    Objective:    BP 119/68 (BP Location: Left Arm, Patient Position: Sitting, Cuff Size: Normal)   Pulse (!) 52   Ht 5\' 6"  (1.676 m)   Wt 203 lb 12.8 oz (92.4 kg)   LMP 03/19/1998 Comment: h/o supracervical hysterectomy  BMI 32.89 kg/m  General appearance: alert, cooperative, and appears stated age    Assessment:    Rule out UTI .  Lynch Syndrome   Plan:    Urine culture pending. Pt w/o symptoms today. RTO for Pelvic GYN [place] to evaluate ovaries due to Lynch Syndrome/high-risk status for ovarian cancer.  Pt plans to schedule her routine gyn exam. Letta Kocher

## 2023-02-13 DIAGNOSIS — M1711 Unilateral primary osteoarthritis, right knee: Secondary | ICD-10-CM | POA: Diagnosis not present

## 2023-02-19 DIAGNOSIS — M1711 Unilateral primary osteoarthritis, right knee: Secondary | ICD-10-CM | POA: Diagnosis not present

## 2023-02-26 ENCOUNTER — Ambulatory Visit (INDEPENDENT_AMBULATORY_CARE_PROVIDER_SITE_OTHER): Payer: Medicare Other | Admitting: Family Medicine

## 2023-03-06 ENCOUNTER — Other Ambulatory Visit (HOSPITAL_BASED_OUTPATIENT_CLINIC_OR_DEPARTMENT_OTHER): Payer: Self-pay | Admitting: Cardiovascular Disease

## 2023-03-26 DIAGNOSIS — R040 Epistaxis: Secondary | ICD-10-CM | POA: Diagnosis not present

## 2023-03-26 DIAGNOSIS — Z7902 Long term (current) use of antithrombotics/antiplatelets: Secondary | ICD-10-CM | POA: Diagnosis not present

## 2023-03-28 ENCOUNTER — Ambulatory Visit (INDEPENDENT_AMBULATORY_CARE_PROVIDER_SITE_OTHER): Payer: Medicare Other | Admitting: Family Medicine

## 2023-04-01 ENCOUNTER — Institutional Professional Consult (permissible substitution) (HOSPITAL_BASED_OUTPATIENT_CLINIC_OR_DEPARTMENT_OTHER): Payer: Medicare Other | Admitting: Obstetrics & Gynecology

## 2023-04-02 DIAGNOSIS — E782 Mixed hyperlipidemia: Secondary | ICD-10-CM | POA: Diagnosis not present

## 2023-04-02 DIAGNOSIS — I10 Essential (primary) hypertension: Secondary | ICD-10-CM | POA: Diagnosis not present

## 2023-04-02 DIAGNOSIS — Z79899 Other long term (current) drug therapy: Secondary | ICD-10-CM | POA: Diagnosis not present

## 2023-04-03 ENCOUNTER — Ambulatory Visit (INDEPENDENT_AMBULATORY_CARE_PROVIDER_SITE_OTHER): Payer: Medicare Other | Admitting: Obstetrics & Gynecology

## 2023-04-03 ENCOUNTER — Encounter (HOSPITAL_BASED_OUTPATIENT_CLINIC_OR_DEPARTMENT_OTHER): Payer: Self-pay | Admitting: Obstetrics & Gynecology

## 2023-04-03 ENCOUNTER — Ambulatory Visit (INDEPENDENT_AMBULATORY_CARE_PROVIDER_SITE_OTHER): Payer: Medicare Other

## 2023-04-03 VITALS — BP 118/63 | HR 52 | Ht 66.0 in | Wt 209.6 lb

## 2023-04-03 DIAGNOSIS — I10 Essential (primary) hypertension: Secondary | ICD-10-CM | POA: Diagnosis not present

## 2023-04-03 DIAGNOSIS — R7303 Prediabetes: Secondary | ICD-10-CM | POA: Diagnosis not present

## 2023-04-03 DIAGNOSIS — Z955 Presence of coronary angioplasty implant and graft: Secondary | ICD-10-CM | POA: Diagnosis not present

## 2023-04-03 DIAGNOSIS — D329 Benign neoplasm of meninges, unspecified: Secondary | ICD-10-CM

## 2023-04-03 DIAGNOSIS — Z1509 Genetic susceptibility to other malignant neoplasm: Secondary | ICD-10-CM

## 2023-04-03 DIAGNOSIS — R978 Other abnormal tumor markers: Secondary | ICD-10-CM

## 2023-04-03 DIAGNOSIS — M057A Rheumatoid arthritis with rheumatoid factor of other specified site without organ or systems involvement: Secondary | ICD-10-CM | POA: Diagnosis not present

## 2023-04-03 LAB — COMPREHENSIVE METABOLIC PANEL
ALT: 40 [IU]/L — ABNORMAL HIGH (ref 0–32)
AST: 39 [IU]/L (ref 0–40)
Albumin: 4 g/dL (ref 3.8–4.8)
Alkaline Phosphatase: 59 [IU]/L (ref 44–121)
BUN/Creatinine Ratio: 16 (ref 12–28)
BUN: 19 mg/dL (ref 8–27)
Bilirubin Total: 0.9 mg/dL (ref 0.0–1.2)
CO2: 27 mmol/L (ref 20–29)
Calcium: 9.6 mg/dL (ref 8.7–10.3)
Chloride: 105 mmol/L (ref 96–106)
Creatinine, Ser: 1.2 mg/dL — ABNORMAL HIGH (ref 0.57–1.00)
Globulin, Total: 2.6 g/dL (ref 1.5–4.5)
Glucose: 95 mg/dL (ref 70–99)
Potassium: 4 mmol/L (ref 3.5–5.2)
Sodium: 144 mmol/L (ref 134–144)
Total Protein: 6.6 g/dL (ref 6.0–8.5)
eGFR: 48 mL/min/{1.73_m2} — ABNORMAL LOW (ref >59)

## 2023-04-03 LAB — LIPID PANEL
Chol/HDL Ratio: 2 {ratio} (ref 0.0–4.4)
Cholesterol, Total: 186 mg/dL (ref 100–199)
HDL: 92 mg/dL (ref >39)
LDL Chol Calc (NIH): 80 mg/dL (ref 0–99)
Triglycerides: 79 mg/dL (ref 0–149)
VLDL Cholesterol Cal: 14 mg/dL (ref 5–40)

## 2023-04-04 ENCOUNTER — Other Ambulatory Visit (HOSPITAL_COMMUNITY)
Admission: RE | Admit: 2023-04-04 | Discharge: 2023-04-04 | Disposition: A | Discharge status: Home / Self Care | Payer: Medicare Other | Source: Ambulatory Visit | Attending: Obstetrics & Gynecology | Admitting: Obstetrics & Gynecology

## 2023-04-04 DIAGNOSIS — Z1509 Genetic susceptibility to other malignant neoplasm: Secondary | ICD-10-CM | POA: Diagnosis not present

## 2023-04-04 LAB — CA 125: Cancer Antigen (CA) 125: 7.1 U/mL (ref 0.0–38.1)

## 2023-04-05 LAB — CYTOLOGY - NON PAP

## 2023-04-06 ENCOUNTER — Encounter (HOSPITAL_BASED_OUTPATIENT_CLINIC_OR_DEPARTMENT_OTHER): Payer: Self-pay | Admitting: Obstetrics & Gynecology

## 2023-04-06 NOTE — Progress Notes (Signed)
GYNECOLOGY  VISIT  CC:   Discuss ultrasound results, h/o Lynch syndrome  HPI: 73 y.o. [id number] Married [place] or [place] American female here for discussion of ultrasound results.  Ultrasound obtained due to Lynch syndrome, MLH1 gene mutation and increased risks for ovarian cancer.  We have discussed BSO for last several years but she keeps having medical issues that have gotten in the way-- significant uveitis issues after cataracts that ultimately improved after diagnosis of auto-immune disorder and correct treatment, SOB with exertion that was found to be narrowing of coronary artery and is now s/p stent placement and lastly parasellar meningioma that was resected 02/24/2021 at [facility] and followed there for follow- up.  All of these issues are under good control and she feels very medically stable.  Would like to proceed with surgical scheduling.  Ultrasound today with normal ovaries.  Ca-125 will be obtained as well as urine cytology due to MLH1 gene mutation.  Discussed with pt possible need for removal of cervix as well (h/o supracervical hysterectomy in 1986.  Also will need to reach out to cardiology for clearance and recommendations regarding plavix.  Will also need to reach out to rheumatology for guidance about scheduling procedure as she received Simponi Aria (golimumab) injection every 60 days.    Procedure discussed with patient.  Hospital stay, recovery and pain management all discussed.  Risks discussed including but not limited to bleeding, <1% risk of receiving a  transfusion, infection, risk of bowel/bladder/ureteral/vascular injury discussed as well as possible need for additional surgery if injury does occur discussed.  DVT/PE and rare risk of death discussed.  My actual complications with prior surgeries discussed.  Positioning and incision locations discussed.  Patient aware if pathology abnormal she may need additional treatment.  All questions answered.    Will reach out to gyn/onc  about whether cervix needs to be removed as well.  Will need to touch base with her cardiologist about clearance and guidance for her plavix use before surgery as well as rheumatology regarding golimumab infusion and surgical timing.  Pt knows I will be working on these.    Past Medical History:  Diagnosis Date   Allergic rhinitis    Allergy    Arthritis    Brain tumor (HCC)    Breast cyst 2007   Right   Cataract    surgery 07/2017   Clotting disorder (HCC)    On plavix since stents  for artery blockages   Depression 2006   w/ menopause   Depression screening 09/27/2021   Family history of adverse reaction to anesthesia    sister PONV   Family history of breast cancer    Family history of colon cancer    Family history of kidney cancer 01/15/2018   Family history of lung cancer    Family history of pancreatic cancer    Family history of prostate cancer    Family history of uterine cancer    GERD (gastroesophageal reflux disease)    Glaucoma    Heart murmur    History of hysterectomy, supracervical    Hyperlipidemia    Hypertension    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   MLH1 [id number] (Splice site)   Multinodular goiter    PAD (peripheral artery disease) (HCC)    LE Art [place] 1/18: Occluded prox-mid R SFA // ABIs 9/22: R 0.83; L 1.0   Pustular psoriasis    Tobacco user     MEDS:   Current Outpatient Medications on File Prior to Visit  Medication Sig Dispense Refill   aspirin 81 MG EC tablet Take 81 mg by mouth every evening.      B Complex Vitamins (VITAMIN B COMPLEX) TABS Take 1 tablet by mouth daily.     brimonidine (ALPHAGAN) 0.2 % ophthalmic solution Place 1 drop into both eyes 3 (three) times daily.      clopidogrel (PLAVIX) 75 MG tablet Take 1 tablet (75 mg total) by mouth daily. 90 tablet 3   dorzolamide-timolol (COSOPT) 22.3-6.8 MG/ML ophthalmic solution Place 1 drop into the right eye 2 (two) times daily.     ezetimibe (ZETIA) 10 MG tablet TAKE 1 TABLET BY  MOUTH DAILY 90 tablet 3   folic acid (FOLVITE) 1 MG tablet Take 1 mg by mouth daily.      hydrochlorothiazide (MICROZIDE) 12.5 MG capsule TAKE 1 CAPSULE(12.5 MG) BY MOUTH DAILY 30 capsule 0   hydrocortisone (CORTEF) 10 MG tablet Take 10 mg by mouth daily.     hyoscyamine (LEVSIN SL) 0.125 MG SL tablet DISSOLVE 1 TO 2 TABLETS IN MOUTH UNDER THE TONGUE TWICE DAILY AS NEEDED 50 tablet 1   methotrexate (RHEUMATREX) 2.5 MG tablet Take by mouth once a week. Mondays Take 6 tablets     metoprolol succinate (TOPROL-XL) 25 MG 24 hr tablet Take 1 tablet (25 mg total) by mouth daily. 90 tablet 3   minoxidil (LONITEN) 2.5 MG tablet Take 2.5 mg by mouth daily.     Multiple Vitamins-Minerals (MULTIVITAMIN WITH MINERALS) tablet Take 1 tablet by mouth daily.     Omega-3 Fatty Acids (FISH OIL) 1000 MG CAPS Take 1 capsule by mouth daily.      SIMPONI ARIA 50 MG/4ML SOLN injection Inject 50 mg into the vein. Every 60 Days     No current facility-administered medications on file prior to visit.    ALLERGIES: Sulfamethoxazole-trimethoprim, Conjugated estrogens, and Wellbutrin [bupropion hcl]  SH:  married, non smoker  Review of Systems  Constitutional: Negative.   Genitourinary: Negative.     PHYSICAL EXAMINATION:    BP 118/63 (BP Location: Left Arm, Patient Position: Sitting, Cuff Size: Large)   Pulse (!) 52   Ht 5\' 6"  (1.676 m)   Wt 209 lb 9.6 oz (95.1 kg)   LMP 03/19/1998 Comment: h/o supracervical hysterectomy  BMI 33.83 kg/m      Physical Exam Constitutional:      Appearance: Normal appearance.  Cardiovascular:     Rate and Rhythm: Normal rate and regular rhythm.  Pulmonary:     Effort: Pulmonary effort is normal.     Breath sounds: Normal breath sounds.  Neurological:     General: No focal deficit present.     Mental Status: She is alert.  Psychiatric:        Mood and Affect: Mood normal.        Behavior: Behavior normal.     Assessment/Plan: 1. MLH1-related Lynch syndrome  (HNPCC2) (Primary) - will proceed with surgical planning. - CA 125 - Cytology on urine obtained today - last pap smear 12/08/2020 and was normal  2. Other abnormal tumor markers  3. S/P coronary artery stent placement - will reach out to Dr. Duke Salvia about surgical clearance and plavix perioperatively  4. Essential hypertension  5. Meningioma (HCC) - s/p resection.  Followed at [facility].  6. Pre-diabetes - last hba1c was 5.7 on 01/28/2023  7. 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  H/O heart artery stent   Total time with pt and documentation: 43 minutes

## 2023-04-09 ENCOUNTER — Other Ambulatory Visit: Payer: Self-pay | Admitting: *Deleted

## 2023-04-09 DIAGNOSIS — E782 Mixed hyperlipidemia: Secondary | ICD-10-CM

## 2023-04-10 ENCOUNTER — Ambulatory Visit: Payer: Medicare Other | Admitting: Family Medicine

## 2023-04-10 ENCOUNTER — Encounter: Payer: Self-pay | Admitting: Family Medicine

## 2023-04-10 VITALS — BP 128/74 | HR 57 | Temp 97.0°F | Ht 66.0 in | Wt 209.8 lb

## 2023-04-10 DIAGNOSIS — I10 Essential (primary) hypertension: Secondary | ICD-10-CM

## 2023-04-10 DIAGNOSIS — I25119 Atherosclerotic heart disease of native coronary artery with unspecified angina pectoris: Secondary | ICD-10-CM

## 2023-04-10 DIAGNOSIS — R7303 Prediabetes: Secondary | ICD-10-CM | POA: Diagnosis not present

## 2023-04-10 DIAGNOSIS — R7989 Other specified abnormal findings of blood chemistry: Secondary | ICD-10-CM

## 2023-04-10 DIAGNOSIS — E782 Mixed hyperlipidemia: Secondary | ICD-10-CM | POA: Diagnosis not present

## 2023-04-10 DIAGNOSIS — N1831 Chronic kidney disease, stage 3a: Secondary | ICD-10-CM

## 2023-04-10 NOTE — Patient Instructions (Addendum)
Follow up with nephrology regarding slight recent increase in creatinine. Continue to stay hydrated. No change in blood pressure meds today.   I recommend repeat liver test in next month. That can be done with a lab visit if not checked with one of your other doctors.  Lab visit can be scheduled here in the next month or can walk into the [place] [facility] location below if that is more convenient. [facility] or xray: Walk in 8:30-4:30 during weekdays, no appointment needed [address]  [place], [place] [address]  I will check prediabetes test at next visit in 3 months.   Take care.

## 2023-04-10 NOTE — Progress Notes (Signed)
Subjective:  Patient ID: Pamela Lowe, female    DOB: 1951-01-31  Age: 73 y.o. MRN: [id number]  CC:  Chief Complaint  Patient presents with   Medical Management of Chronic Issues    Pt notes doing well, just wants to ask about lab results notes Rheum was concerned about Kidney and liver values wanted to review those today, otherwise well     HPI Pamela Lowe presents for   Hypertension: She is followed by endocrinology with history of goiter, rheumatology for rheumatoid arthritis and adrenal insufficiency status post meningioma removal.  Has also been seen by nephrology with previous increased echogenicity of right renal cortex on prior ultrasound, CKD stage IIIa presumably nephrosclerosis due to hypertension, age and atherosclerosis plus or minus her methotrexate.  Continue to follow-up planned. Dr. Ronalee Belts. Unsure of next appt. Plans on follow up with nephrology - requested that labs sent to her nephrologist.  No nsaids. Increased fluid intake since elevated reading - back to prior levels. Some decreased fluid intake with holidays.  Home readings: normal recently.  Has seen Dr. [facility] Salvia few months ago.  Taking hydrochlorothiazide 12.5mg  every day, toprol.  Off ace- inhibitor.  BP Readings from Last 3 Encounters:  04/10/23 128/74  04/03/23 118/63  02/11/23 119/68   Lab Results  Component Value Date   CREATININE 1.20 (H) 04/02/2023   Hyperlipidemia: Treated with Crestor, Zetia, history of CAD, PAD followed by cardiology.  Borderline elevated LFT of ALT 40 on January 14. Plan for pharmacy visit to discuss meds for LDL goal under 70.  Denies nausea, vomiting, abdominal pain, scleral icterus  Denies alcohol use or frequent Tylenol use.  Coming off methotrexate. Higher lfts in July 2024. Plans to discuss her methotrexate dosing with rheumatology.  No new side effects with meds.   Lab Results  Component Value Date   CHOL 186 04/02/2023   HDL 92 04/02/2023   LDLCALC 80  04/02/2023   TRIG 79 04/02/2023   CHOLHDL 2.0 04/02/2023   Lab Results  Component Value Date   ALT 40 (H) 04/02/2023   AST 39 04/02/2023   ALKPHOS 59 04/02/2023   BILITOT 0.9 04/02/2023    Prediabetes: Diet/exercise approach.  Has been followed by weight management, attempt at semaglutide previously but not covered with insurance.  No current weight loss meds.  Bicycle and walking for exercise. No sugar beverages. Some desserts over the holidays. Doing better now.  Lab Results  Component Value Date   HGBA1C 5.7 (H) 01/28/2023   Wt Readings from Last 3 Encounters:  04/10/23 209 lb 12.8 oz (95.2 kg)  04/03/23 209 lb 9.6 oz (95.1 kg)  02/11/23 203 lb 12.8 oz (92.4 kg)   Planned oophorectomy with GYN, hx of Lynch syndrome.   History Patient Active Problem List   Diagnosis Date Noted   Generalized obesity wtih starting BMI 34 09/05/2022   Pre-diabetes 04/25/2022   Pyrosis 03/30/2022   Bloating 03/30/2022   Abdominal pain, epigastric 03/30/2022   Polyphagia 03/22/2022   Eating disorder 12/27/2021   Osteoarthritis 09/28/2021   Degeneration of lumbosacral intervertebral disc 09/28/2021   Elevated ALT measurement 09/28/2021   Other fatigue 09/27/2021   Health care maintenance 09/27/2021   Vitamin D deficiency 09/27/2021   Spondylosis without myelopathy 08/03/2021   Rheumatoid arthritis with rheumatoid factor (HCC) 08/03/2021   Psoriasis 08/03/2021   Hypopituitarism (HCC) 08/03/2021   Adrenal insufficiency (HCC) 08/03/2021   Gastroesophageal reflux disease 07/26/2021   Meningioma (HCC) 03/21/2021   Visual field defect  03/21/2021   Chronic left shoulder pain 03/21/2021   Cervical radiculopathy 03/21/2021   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   Pseudophakia of both eyes 06/29/2019   Steroid responders to glaucoma of both eyes 06/29/2019   S/P coronary artery stent placement    PAD (peripheral artery disease) (HCC)    CAD (coronary artery disease) 12/26/2018   Primary  open-angle glaucoma 10/28/2018   Glaucoma associated with ocular inflammation 10/28/2018   Aortic atherosclerosis (HCC) 08/19/2018   Uveitis 05/05/2018   Goiter 05/05/2018   Mixed hyperlipidemia 05/05/2018   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   Family history of kidney cancer 01/15/2018   Family history of pancreatic cancer    Family history of breast cancer    Family history of uterine cancer    Family history of colon cancer    Family history of prostate cancer    Family history of lung cancer    Hypertensive retinopathy of both eyes 11/12/2017   Iritis of right eye 11/12/2017   Nuclear sclerotic cataract of left eye 11/12/2017   Ocular hypertension of right eye 11/12/2017   Inflammation of eye, right 08/07/2017   Memory loss 09/21/2016   Essential hypertension 09/21/2016   Low vitamin D level 09/21/2016   Past Medical History:  Diagnosis Date   Allergic rhinitis    Allergy    Arthritis    Brain tumor (HCC)    Breast cyst 2007   Right   Cataract    surgery 07/2017   Clotting disorder (HCC)    On plavix since stents  for artery blockages   Depression 2006   w/ menopause   Depression screening 09/27/2021   Family history of adverse reaction to anesthesia    sister PONV   Family history of breast cancer    Family history of colon cancer    Family history of kidney cancer 01/15/2018   Family history of lung cancer    Family history of pancreatic cancer    Family history of prostate cancer    Family history of uterine cancer    GERD (gastroesophageal reflux disease)    Glaucoma    Heart murmur    History of hysterectomy, supracervical    Hyperlipidemia    Hypertension    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   MLH1 c.[id number]_[id number] (Splice site)   Multinodular goiter    PAD (peripheral artery disease) (HCC)    LE Art [place] 1/18: Occluded prox-mid R SFA // ABIs 9/22: R 0.83; L 1.0   Pustular psoriasis    Tobacco user    Past Surgical History:  Procedure Laterality Date    BIOPSY  01/15/2022   Procedure: BIOPSY;  Surgeon: Lemar Lofty., MD;  Location: [facility];  Service: Gastroenterology;;   BRAIN TUMOR EXCISION  02/24/2021   at [facility]   BREAST BIOPSY Bilateral 1985   CARDIAC CATHETERIZATION     CATARACT EXTRACTION Right 07/2017   COLONOSCOPY     COLONOSCOPY WITH PROPOFOL N/A 01/15/2022   Procedure: COLONOSCOPY WITH PROPOFOL;  Surgeon: Lemar Lofty., MD;  Location: [facility];  Service: Gastroenterology;  Laterality: N/A;   CORONARY STENT INTERVENTION N/A 12/26/2018   Procedure: CORONARY STENT INTERVENTION;  Surgeon: Lyn Records, MD;  Location: [facility];  Service: Cardiovascular;  Laterality: N/A;   ESOPHAGOGASTRODUODENOSCOPY (EGD) WITH PROPOFOL N/A 01/15/2022   Procedure: ESOPHAGOGASTRODUODENOSCOPY (EGD) WITH PROPOFOL;  Surgeon: Meridee Score Netty Starring., MD;  Location: [facility];  Service: Gastroenterology;  Laterality: N/A;   EYE SURGERY  2019, 2020   Cataracts,  eye shunt implants   LEFT HEART CATH AND CORONARY ANGIOGRAPHY N/A 12/17/2018   Procedure: LEFT HEART CATH AND CORONARY ANGIOGRAPHY;  Surgeon: Lyn Records, MD;  Location: [facility];  Service: Cardiovascular;  Laterality: N/A;   POLYPECTOMY  01/15/2022   Procedure: POLYPECTOMY;  Surgeon: Meridee Score Netty Starring., MD;  Location: [facility];  Service: Gastroenterology;;   SUPRACERVICAL ABDOMINAL HYSTERECTOMY  1986   UPPER GASTROINTESTINAL ENDOSCOPY     Allergies  Allergen Reactions   Sulfamethoxazole-Trimethoprim Other (See Comments) and Rash    Chills/achy Flu like symptoms    Conjugated Estrogens     Other reaction(s): rash   Wellbutrin [Bupropion Hcl] Rash    rash   Prior to Admission medications   Medication Sig Start Date End Date Taking? Authorizing Provider  aspirin 81 MG EC tablet Take 81 mg by mouth every evening.    Yes [provider]  B Complex Vitamins (VITAMIN B COMPLEX) TABS Take 1 tablet by mouth daily.   Yes  [provider]  brimonidine (ALPHAGAN) 0.2 % ophthalmic solution Place 1 drop into both eyes 3 (three) times daily.  11/20/18  Yes [provider]  clopidogrel (PLAVIX) 75 MG tablet Take 1 tablet (75 mg total) by mouth daily. 01/21/23 01/16/24 Yes Chilton Si, MD  dorzolamide-timolol (COSOPT) 22.3-6.8 MG/ML ophthalmic solution Place 1 drop into the right eye 2 (two) times daily. 06/29/19  Yes [provider]  ezetimibe (ZETIA) 10 MG tablet TAKE 1 TABLET BY MOUTH DAILY 10/17/22  Yes Chilton Si, MD  folic acid (FOLVITE) 1 MG tablet Take 1 mg by mouth daily.    Yes [provider]  hydrochlorothiazide (MICROZIDE) 12.5 MG capsule TAKE 1 CAPSULE(12.5 MG) BY MOUTH DAILY 03/06/23  Yes Chilton Si, MD  hydrocortisone (CORTEF) 10 MG tablet Take 10 mg by mouth daily. 07/08/21  Yes [provider]  methotrexate (RHEUMATREX) 2.5 MG tablet Take by mouth once a week. Mondays Take 6 tablets   Yes [provider]  metoprolol succinate (TOPROL-XL) 25 MG 24 hr tablet Take 1 tablet (25 mg total) by mouth daily. 01/21/23  Yes Chilton Si, MD  minoxidil (LONITEN) 2.5 MG tablet Take 2.5 mg by mouth daily.   Yes [provider]  Multiple Vitamins-Minerals (MULTIVITAMIN WITH MINERALS) tablet Take 1 tablet by mouth daily.   Yes [provider]  Omega-3 Fatty Acids (FISH OIL) 1000 MG CAPS Take 1 capsule by mouth daily.    Yes [provider]  SIMPONI ARIA 50 MG/4ML SOLN injection Inject 50 mg into the vein. Every 60 Days 07/16/19  Yes [provider]   Social History   Socioeconomic History   Marital status: Married    Spouse name: Tasia Catchings   Number of children: 0   Years of education: Not on file   Highest education level: Master's degree (e.g., MA, MS, MEng, MEd, MSW, MBA)  Occupational History   Occupation: Special Ed Teacher    Comment: Dentist  Tobacco Use   Smoking status: Former    Current  packs/day: 0.00    Average packs/day: 0.3 packs/day for 36.0 years (9.0 ttl pk-yrs)    Types: Cigarettes    Start date: 08/01/1981    Quit date: 08/01/2017    Years since quitting: 5.6   Smokeless tobacco: Never   Tobacco comments:    Started smoking at about 73 yrs old  Vaping Use   Vaping status: Never Used  Substance and Sexual Activity   Alcohol use: Not  Currently   Drug use: No   Sexual activity: Not Currently    Partners: Male    Birth control/protection: Post-menopausal, Surgical    Comment: Hysterectomy  Other Topics Concern   Not on file  Social History Narrative   Not on file   Social Drivers of Health   Financial Resource Strain: Low Risk  (04/05/2023)   Overall Financial Resource Strain (CARDIA)    Difficulty of Paying Living Expenses: Not hard at all  Food Insecurity: No Food Insecurity (04/05/2023)   Hunger Vital Sign    Worried About Running Out of Food in the Last Year: Never true    Ran Out of Food in the Last Year: Never true  Transportation Needs: No Transportation Needs (04/05/2023)   PRAPARE - Administrator, Civil Service (Medical): No    Lack of Transportation (Non-Medical): No  Physical Activity: Sufficiently Active (04/05/2023)   Exercise Vital Sign    Days of Exercise per Week: 5 days    Minutes of Exercise per Session: 30 min  Stress: No Stress Concern Present (04/05/2023)   [employer] of Occupational Health - Occupational Stress Questionnaire    Feeling of Stress : Not at all  Social Connections: Moderately Integrated (04/05/2023)   Social Connection and Isolation Panel [NHANES]    Frequency of Communication with Friends and Family: More than three times a week    Frequency of Social Gatherings with Friends and Family: Once a week    Attends Religious Services: Never    Database administrator or Organizations: No    Attends Engineer, structural: More than 4 times per year    Marital Status: Married  Catering manager  Violence: Not At Risk (08/16/2022)   Humiliation, Afraid, Rape, and Kick questionnaire    Fear of Current or Ex-Partner: No    Emotionally Abused: No    Physically Abused: No    Sexually Abused: No    Review of Systems  Constitutional:  Negative for fatigue and unexpected weight change.  Respiratory:  Negative for chest tightness and shortness of breath.   Cardiovascular:  Negative for chest pain, palpitations and leg swelling.  Gastrointestinal:  Negative for abdominal pain and blood in stool.  Neurological:  Negative for dizziness, syncope, light-headedness and headaches.     Objective:   Vitals:   04/10/23 0956  BP: 128/74  Pulse: (!) 57  Temp: (!) 97 F (36.1 C)  TempSrc: Temporal  SpO2: 98%  Weight: 209 lb 12.8 oz (95.2 kg)  Height: 5\' 6"  (1.676 m)     Physical Exam Vitals reviewed.  Constitutional:      Appearance: Normal appearance. She is well-developed.  HENT:     Head: Normocephalic and atraumatic.  Eyes:     Conjunctiva/sclera: Conjunctivae normal.     Pupils: Pupils are equal, round, and reactive to light.  Neck:     Vascular: No carotid bruit.  Cardiovascular:     Rate and Rhythm: Normal rate and regular rhythm.     Heart sounds: Normal heart sounds.  Pulmonary:     Effort: Pulmonary effort is normal.     Breath sounds: Normal breath sounds.  Abdominal:     Palpations: Abdomen is soft. There is no pulsatile mass.     Tenderness: There is no abdominal tenderness.  Musculoskeletal:     Right lower leg: No edema.     Left lower leg: No edema.  Skin:    General: Skin is warm and  dry.  Neurological:     Mental Status: She is alert and oriented to person, place, and time.  Psychiatric:        Mood and Affect: Mood normal.        Behavior: Behavior normal.        Assessment & Plan:  Pamela Lowe is a 73 y.o. female . Essential hypertension Stable on current regimen, seen by cardiology as above.  No med changes at this time.  Recent labs  noted.  Pre-diabetes  -Will hold on A1c at this time, recheck in 3 months.  Some dietary indiscretion with holidays, now back on track.  Mixed hyperlipidemia Coronary artery disease involving native coronary artery of native heart with angina pectoris (HCC)  -Tolerating current meds but apparently will be meeting with pharmacist to review changes with goal LDL under 70.  Stage 3a chronic kidney disease (HCC) - Plan: Comprehensive metabolic panel  -Minimal increase in creatinine noted on most recent labs but has increased her fluid intake back to her previous regimen.  This could be a factor.  She does have a nephrologist and has requested that the labs be sent to nephrology, other testing or changes to be determined by nephrology with RTC precautions given.  Asymptomatic.  LFT elevation - Plan: Comprehensive metabolic panel  -Mild elevated ALT, has been higher previously.  Potentially could be related to her medications.  Planning on medication adjustment with rheumatology.  Recommended CMP as a lab only visit within the next month if not checked by one of her other specialists.  Further workup to be determined by that level.  No orders of the defined types were placed in this encounter.  Patient Instructions  Follow up with nephrology regarding slight recent increase in creatinine. Continue to stay hydrated. No change in blood pressure meds today.   I recommend repeat liver test in next month. That can be done with a lab visit if not checked with one of your other doctors.  Lab visit can be scheduled here in the next month or can walk into the [facility] location below if that is more convenient. [facility] or xray: Walk in 8:30-4:30 during weekdays, no appointment needed [address]  [place], [place] [address]      Signed,   Meredith Staggers, MD [facility], [facility] 04/10/23 10:24 AM

## 2023-04-12 ENCOUNTER — Encounter: Payer: Self-pay | Admitting: Pharmacist

## 2023-04-12 ENCOUNTER — Ambulatory Visit: Payer: Medicare Other | Attending: Internal Medicine | Admitting: Pharmacist

## 2023-04-12 DIAGNOSIS — E782 Mixed hyperlipidemia: Secondary | ICD-10-CM | POA: Insufficient documentation

## 2023-04-12 DIAGNOSIS — I25758 Atherosclerosis of native coronary artery of transplanted heart with other forms of angina pectoris: Secondary | ICD-10-CM | POA: Insufficient documentation

## 2023-04-12 DIAGNOSIS — I739 Peripheral vascular disease, unspecified: Secondary | ICD-10-CM | POA: Insufficient documentation

## 2023-04-12 NOTE — Progress Notes (Signed)
Patient ID: Pamela Lowe                 DOB: 12-07-1950                    MRN: [id number]     HPI: Pamela Lowe is a 73 y.o. female patient referred to lipid clinic by Dr [facility] Salvia. PMH is significant for HTN, CAD, PAD, and history of meningioma. Currently managed on rosuvastatin and ezetimibe.    Patient presents today in good spirits. Has switched her diet after going to healthy weight and wellness. Eats a more Mediterranean style diet.  Had meningioma removal in 2022 at [facility].  Currently on oral hydrocortisone, methotrexate and Simponi. Managed by rheumatology  HLD managed by rosuvastatin and ezetimibe without reported side effects. Was referred to start PCSK9i therapy in the past but insurance would not cover.  Current Medications:  Rosuvastatin 20mg  daily Ezetimibe 10mg  daily  Risk Factors:  CAD PAD  LDL goal: <55  Labs:TC 186, Trigs 79, HDL 92, LDL 80 (04/02/23)  Past Medical History:  Diagnosis Date   Allergic rhinitis    Allergy    Arthritis    Brain tumor (HCC)    Breast cyst 2007   Right   Cataract    surgery 07/2017   Clotting disorder (HCC)    On plavix since stents  for artery blockages   Depression 2006   w/ menopause   Depression screening 09/27/2021   Family history of adverse reaction to anesthesia    sister PONV   Family history of breast cancer    Family history of colon cancer    Family history of kidney cancer 01/15/2018   Family history of lung cancer    Family history of pancreatic cancer    Family history of prostate cancer    Family history of uterine cancer    GERD (gastroesophageal reflux disease)    Glaucoma    Heart murmur    History of hysterectomy, supracervical    Hyperlipidemia    Hypertension    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   MLH1 c.1410-2_1410-1delinsCC (Splice site)   Multinodular goiter    PAD (peripheral artery disease) (HCC)    LE Art [place] 1/18: Occluded prox-mid R SFA // ABIs 9/22: R 0.83; L 1.0   Pustular psoriasis     Tobacco user     Current Outpatient Medications on File Prior to Visit  Medication Sig Dispense Refill   aspirin 81 MG EC tablet Take 81 mg by mouth every evening.      B Complex Vitamins (VITAMIN B COMPLEX) TABS Take 1 tablet by mouth daily.     brimonidine (ALPHAGAN) 0.2 % ophthalmic solution Place 1 drop into both eyes 3 (three) times daily.      clopidogrel (PLAVIX) 75 MG tablet Take 1 tablet (75 mg total) by mouth daily. 90 tablet 3   dorzolamide-timolol (COSOPT) 22.3-6.8 MG/ML ophthalmic solution Place 1 drop into the right eye 2 (two) times daily.     ezetimibe (ZETIA) 10 MG tablet TAKE 1 TABLET BY MOUTH DAILY 90 tablet 3   folic acid (FOLVITE) 1 MG tablet Take 1 mg by mouth daily.      hydrochlorothiazide (MICROZIDE) 12.5 MG capsule TAKE 1 CAPSULE(12.5 MG) BY MOUTH DAILY 30 capsule 0   hydrocortisone (CORTEF) 10 MG tablet Take 10 mg by mouth daily.     methotrexate (RHEUMATREX) 2.5 MG tablet Take by mouth once a week. Mondays Take 6 tablets  metoprolol succinate (TOPROL-XL) 25 MG 24 hr tablet Take 1 tablet (25 mg total) by mouth daily. 90 tablet 3   minoxidil (LONITEN) 2.5 MG tablet Take 2.5 mg by mouth daily.     Multiple Vitamins-Minerals (MULTIVITAMIN WITH MINERALS) tablet Take 1 tablet by mouth daily.     Omega-3 Fatty Acids (FISH OIL) 1000 MG CAPS Take 1 capsule by mouth daily.      SIMPONI ARIA 50 MG/4ML SOLN injection Inject 50 mg into the vein. Every 60 Days     No current facility-administered medications on file prior to visit.    Allergies  Allergen Reactions   Sulfamethoxazole-Trimethoprim Other (See Comments) and Rash    Chills/achy Flu like symptoms    Conjugated Estrogens     Other reaction(s): rash   Wellbutrin [Bupropion Hcl] Rash    rash    Assessment/Plan:  1. Hyperlipidemia - Patient last LDL was 80 which is above goal of <55. Aggressive goal due to CAD, PAD, and microvascular complications. Was previously at goal but LDL has increased in last 2  years. Steroid use could be contributory.  Discussed next LDL lowering options such as PCSK9i or bempedoic acid. Explained pros and cons of both products and patient prefers to try Repatha. Using demo pen, educated patient on mechanism of action, storage, site selection, administration, and possible adverse effects. Will submit PA and contact patient when approved. Recheck fasting lipid panel in 2-3 months.  Continue rosuvastatin 20mg  daily Continue ezetimibe 10mg  daily Start Repatha 140mg  q 2 weeks Recheck lipid panel in 2-3 months  Laural Golden, PharmD, BCACP, CDCES, CPP [address], [address] [place], [place], [address] Phone: [phone], Fax: [phone]

## 2023-04-12 NOTE — Patient Instructions (Addendum)
It was nice meeting you today  We would like your LDL (bad cholesterol) to be less than 55  Please continue your rosuvastatin and ezetimibe  The medication we discussed today is called Repatha which is an injection you would take every 2 weeks  I will complete the prior authorization for you and contact you when it is approved  Once you start the medication we will recheck your fasting lipid panel in 2-3 months  Please let us know if you have any questions or issues  Laural Golden, PharmD, BCACP, CDCES, CPP [address] [place], [place], [address] Phone: [phone], Fax: [phone]

## 2023-04-15 DIAGNOSIS — M059 Rheumatoid arthritis with rheumatoid factor, unspecified: Secondary | ICD-10-CM | POA: Diagnosis not present

## 2023-04-15 DIAGNOSIS — L4050 Arthropathic psoriasis, unspecified: Secondary | ICD-10-CM | POA: Diagnosis not present

## 2023-04-16 ENCOUNTER — Ambulatory Visit: Payer: Medicare Other | Admitting: Gastroenterology

## 2023-04-26 DIAGNOSIS — I10 Essential (primary) hypertension: Secondary | ICD-10-CM | POA: Diagnosis not present

## 2023-04-26 DIAGNOSIS — E230 Hypopituitarism: Secondary | ICD-10-CM | POA: Diagnosis not present

## 2023-04-26 DIAGNOSIS — E0590 Thyrotoxicosis, unspecified without thyrotoxic crisis or storm: Secondary | ICD-10-CM | POA: Diagnosis not present

## 2023-04-26 DIAGNOSIS — E2740 Unspecified adrenocortical insufficiency: Secondary | ICD-10-CM | POA: Diagnosis not present

## 2023-04-26 DIAGNOSIS — E210 Primary hyperparathyroidism: Secondary | ICD-10-CM | POA: Diagnosis not present

## 2023-04-26 DIAGNOSIS — E049 Nontoxic goiter, unspecified: Secondary | ICD-10-CM | POA: Diagnosis not present

## 2023-04-26 DIAGNOSIS — D352 Benign neoplasm of pituitary gland: Secondary | ICD-10-CM | POA: Diagnosis not present

## 2023-04-26 DIAGNOSIS — M8580 Other specified disorders of bone density and structure, unspecified site: Secondary | ICD-10-CM | POA: Diagnosis not present

## 2023-05-01 ENCOUNTER — Telehealth (HOSPITAL_BASED_OUTPATIENT_CLINIC_OR_DEPARTMENT_OTHER): Payer: Self-pay | Admitting: *Deleted

## 2023-05-01 NOTE — Telephone Encounter (Signed)
Patient called and left a message  that she returning Kim's call.

## 2023-05-01 NOTE — Telephone Encounter (Signed)
Pt called to get update on status of scheduling surgery for removal of ovaries due to Lynch syndrome. Advised pt that I would send her request to provider and get back to her with update. Pt verbalized understanding.

## 2023-05-08 DIAGNOSIS — Z79899 Other long term (current) drug therapy: Secondary | ICD-10-CM | POA: Diagnosis not present

## 2023-05-08 DIAGNOSIS — D352 Benign neoplasm of pituitary gland: Secondary | ICD-10-CM | POA: Diagnosis not present

## 2023-05-08 DIAGNOSIS — E049 Nontoxic goiter, unspecified: Secondary | ICD-10-CM | POA: Diagnosis not present

## 2023-05-08 DIAGNOSIS — E230 Hypopituitarism: Secondary | ICD-10-CM | POA: Diagnosis not present

## 2023-05-08 DIAGNOSIS — E210 Primary hyperparathyroidism: Secondary | ICD-10-CM | POA: Diagnosis not present

## 2023-05-14 ENCOUNTER — Telehealth: Payer: Self-pay

## 2023-05-14 ENCOUNTER — Encounter: Payer: Self-pay | Admitting: Gastroenterology

## 2023-05-14 ENCOUNTER — Ambulatory Visit (INDEPENDENT_AMBULATORY_CARE_PROVIDER_SITE_OTHER): Payer: Medicare Other | Admitting: Gastroenterology

## 2023-05-14 VITALS — BP 118/68 | HR 71 | Ht 66.0 in | Wt 200.0 lb

## 2023-05-14 DIAGNOSIS — Z1509 Genetic susceptibility to other malignant neoplasm: Secondary | ICD-10-CM | POA: Diagnosis not present

## 2023-05-14 DIAGNOSIS — Z860101 Personal history of adenomatous and serrated colon polyps: Secondary | ICD-10-CM | POA: Diagnosis not present

## 2023-05-14 DIAGNOSIS — K2100 Gastro-esophageal reflux disease with esophagitis, without bleeding: Secondary | ICD-10-CM | POA: Diagnosis not present

## 2023-05-14 DIAGNOSIS — Z7902 Long term (current) use of antithrombotics/antiplatelets: Secondary | ICD-10-CM | POA: Diagnosis not present

## 2023-05-14 MED ORDER — NA SULFATE-K SULFATE-MG SULF 17.5-3.13-1.6 GM/177ML PO SOLN: 1.0000 | ORAL | 0 refills | Status: DC

## 2023-05-14 NOTE — Telephone Encounter (Signed)
   Patient Name: Pamela Lowe  DOB: 12/05/1950 MRN: [id number]  Primary Cardiologist: Lesleigh Noe, MD (Inactive)  Chart reviewed as part of pre-operative protocol coverage.   Patient can hold Plavix 5 days prior to procedure and should continue ASA 81 mg daily perioperative period.   Napoleon Form, Leodis Rains, NP 05/14/2023, 4:39 PM

## 2023-05-14 NOTE — Patient Instructions (Addendum)
 We have sent the following medications to your pharmacy for you to pick up at your convenience: Suprep   You have been scheduled for an endoscopy and colonoscopy. Please follow the written instructions given to you at your visit today.  If you use inhalers (even only as needed), please bring them with you on the day of your procedure.  DO NOT TAKE 7 DAYS PRIOR TO TEST- Trulicity (dulaglutide) Ozempic, Wegovy (semaglutide) Mounjaro (tirzepatide) Bydureon Bcise (exanatide extended release)  DO NOT TAKE 1 DAY PRIOR TO YOUR TEST Rybelsus (semaglutide) Adlyxin (lixisenatide) Victoza (liraglutide) Byetta (exanatide)   _____________________________________________________  If your blood pressure at your visit was 140/90 or greater, please contact your primary care physician to follow up on this.  _______________________________________________________  If you are age 72 or older, your body mass index should be between 23-30. Your Body mass index is 32.28 kg/m. If this is out of the aforementioned range listed, please consider follow up with your Primary Care Provider.  If you are age 83 or younger, your body mass index should be between 19-25. Your Body mass index is 32.28 kg/m. If this is out of the aformentioned range listed, please consider follow up with your Primary Care Provider.   ________________________________________________________  The [facility] providers would like to encourage you to use [facility] to communicate with providers for non-urgent requests or questions.  Due to long hold times on the telephone, sending your provider a message by [facility] may be a faster and more efficient way to get a response.  Please allow 48 business hours for a response.  Please remember that this is for non-urgent requests.  _______________________________________________________   Due to recent changes in healthcare laws, you may see the results of your imaging and laboratory studies on  MyChart before your provider has had a chance to review them.  We understand that in some cases there may be results that are confusing or concerning to you. Not all laboratory results come back in the same time frame and the provider may be waiting for multiple results in order to interpret others.  Please give [place] 48 hours in order for your provider to thoroughly review all the results before contacting the office for clarification of your results.   Thank you for choosing me and [facility].  Dr. Meridee Score

## 2023-05-14 NOTE — Telephone Encounter (Signed)
 Request for surgical clearance:     Endoscopy Procedure  What type of surgery is being performed?     Colon /EGD   When is this surgery scheduled?     06/24/23  What type of clearance is required ?   Pharmacy  Are there any medications that need to be held prior to surgery and how long? Plavix x5 days   Practice name and name of physician performing surgery?      [facility]  What is your office phone and fax number?      Phone- [phone]  Fax- [phone]  Anesthesia type (None, local, MAC, general) ?       MAC  Please route your response to Lucky Rathke, CMA

## 2023-05-14 NOTE — Progress Notes (Signed)
 GASTROENTEROLOGY OUTPATIENT CLINIC VISIT   Primary Care Provider Shade Flood, MD [address] [place] [address] [phone]  Patient Profile: Pamela Lowe is a 73 y.o. female with a pmh significant for Lynch syndrome, previous brain tumor excision, PAD & Clotting disorder (on Plavix), s/p hysterectomy, prior adrenal insufficiency, colon polyps (TA's), diverticulosis.  The patient presents to the [facility] for an evaluation and management of problem(s) noted below:  Problem List 1. Lynch syndrome   2. Hx of adenomatous colonic polyps   3. Gastroesophageal reflux disease with esophagitis without hemorrhage   4. Antiplatelet or antithrombotic long-term use    History of Present Illness Please see prior notes for full details of HPI.  Interval History The patient returns for follow-up.  Overall, the patient states that she is doing quite well at this time.  Pepcid has been helpful for her pyrosis symptoms.  She does not have any dysphagia symptoms or odynophagia symptoms.  Her bowel habits are are unchanged at this time.  Rarely she has seen a small amount of blood per rectum while wiping.  This is very rare.  She wonders if this is diverticular related or hemorrhoid related.  The previous issues of abdominal gas and bloating are not in place currently.  GI Review of Systems Positive as above Negative for abdominal pain, melena, change in bowel habits  Review of Systems General: Denies fevers/chills/weight loss unintentionally Cardiovascular: Denies chest pain Pulmonary: Denies shortness of breath Gastroenterological: See HPI Genitourinary: Denies darkened urine  Hematological: Denies easy bruising/bleeding Dermatological: Denies juandice Psychological: Mood is stable   Medications Current Outpatient Medications  Medication Sig Dispense Refill   aspirin 81 MG EC tablet Take 81 mg by mouth every evening.      B Complex Vitamins  (VITAMIN B COMPLEX) TABS Take 1 tablet by mouth daily.     brimonidine (ALPHAGAN) 0.2 % ophthalmic solution Place 1 drop into both eyes 3 (three) times daily.      clopidogrel (PLAVIX) 75 MG tablet Take 1 tablet (75 mg total) by mouth daily. 90 tablet 3   dorzolamide-timolol (COSOPT) 22.3-6.8 MG/ML ophthalmic solution Place 1 drop into the right eye 2 (two) times daily.     ezetimibe (ZETIA) 10 MG tablet TAKE 1 TABLET BY MOUTH DAILY 90 tablet 3   folic acid (FOLVITE) 1 MG tablet Take 1 mg by mouth daily.      hydrochlorothiazide (MICROZIDE) 12.5 MG capsule TAKE 1 CAPSULE(12.5 MG) BY MOUTH DAILY 30 capsule 0   methotrexate (RHEUMATREX) 2.5 MG tablet Take by mouth once a week. Mondays Take 6 tablets     metoprolol succinate (TOPROL-XL) 25 MG 24 hr tablet Take 1 tablet (25 mg total) by mouth daily. 90 tablet 3   minoxidil (LONITEN) 2.5 MG tablet Take 2.5 mg by mouth daily.     Multiple Vitamins-Minerals (MULTIVITAMIN WITH MINERALS) tablet Take 1 tablet by mouth daily.     Omega-3 Fatty Acids (FISH OIL) 1000 MG CAPS Take 1 capsule by mouth daily.      rosuvastatin (CRESTOR) 20 MG tablet Take 20 mg by mouth daily.     SIMPONI ARIA 50 MG/4ML SOLN injection Inject 50 mg into the vein. Every 60 Days     No current facility-administered medications for this visit.    Allergies Allergies  Allergen Reactions   Sulfamethoxazole-Trimethoprim Other (See Comments) and Rash    Chills/achy Flu like symptoms    Conjugated Estrogens  Other reaction(s): rash   Wellbutrin [Bupropion Hcl] Rash    rash    Histories Past Medical History:  Diagnosis Date   Allergic rhinitis    Allergy    Arthritis    Brain tumor (HCC)    Breast cyst 2007   Right   Cataract    surgery 07/2017   Clotting disorder (HCC)    On plavix since stents  for artery blockages   Depression 2006   w/ menopause   Depression screening 09/27/2021   Family history of adverse reaction to anesthesia    sister PONV   Family  history of breast cancer    Family history of colon cancer    Family history of kidney cancer 01/15/2018   Family history of lung cancer    Family history of pancreatic cancer    Family history of prostate cancer    Family history of uterine cancer    GERD (gastroesophageal reflux disease)    Glaucoma    Heart murmur    History of hysterectomy, supracervical    Hyperlipidemia    Hypertension    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   MLH1 [id number] (Splice site)   Multinodular goiter    PAD (peripheral artery disease) (HCC)    LE Art [place] 1/18: Occluded prox-mid R SFA // ABIs 9/22: R 0.83; L 1.0   Pustular psoriasis    Tobacco user    Past Surgical History:  Procedure Laterality Date   BIOPSY  01/15/2022   Procedure: BIOPSY;  Surgeon: Lemar Lofty., MD;  Location: [facility];  Service: Gastroenterology;;   BRAIN TUMOR EXCISION  02/24/2021   at [facility]   BREAST BIOPSY Bilateral 1985   CARDIAC CATHETERIZATION     CATARACT EXTRACTION Right 07/2017   COLONOSCOPY     COLONOSCOPY WITH PROPOFOL N/A 01/15/2022   Procedure: COLONOSCOPY WITH PROPOFOL;  Surgeon: Lemar Lofty., MD;  Location: [facility];  Service: Gastroenterology;  Laterality: N/A;   CORONARY STENT INTERVENTION N/A 12/26/2018   Procedure: CORONARY STENT INTERVENTION;  Surgeon: Lyn Records, MD;  Location: [facility];  Service: Cardiovascular;  Laterality: N/A;   ESOPHAGOGASTRODUODENOSCOPY (EGD) WITH PROPOFOL N/A 01/15/2022   Procedure: ESOPHAGOGASTRODUODENOSCOPY (EGD) WITH PROPOFOL;  Surgeon: Meridee Score Netty Starring., MD;  Location: [facility];  Service: Gastroenterology;  Laterality: N/A;   EYE SURGERY  2019, 2020   Cataracts, eye shunt implants   LEFT HEART CATH AND CORONARY ANGIOGRAPHY N/A 12/17/2018   Procedure: LEFT HEART CATH AND CORONARY ANGIOGRAPHY;  Surgeon: Lyn Records, MD;  Location: [facility];  Service: Cardiovascular;  Laterality: N/A;   POLYPECTOMY  01/15/2022    Procedure: POLYPECTOMY;  Surgeon: Meridee Score Netty Starring., MD;  Location: [facility];  Service: Gastroenterology;;   SUPRACERVICAL ABDOMINAL HYSTERECTOMY  1986   UPPER GASTROINTESTINAL ENDOSCOPY     Social History   Socioeconomic History   Marital status: Married    Spouse name: Tasia Catchings   Number of children: 0   Years of education: Not on file   Highest education level: Master's degree (e.g., MA, MS, MEng, MEd, MSW, MBA)  Occupational History   Occupation: Special Ed Teacher    Comment: Dentist  Tobacco Use   Smoking status: Former    Current packs/day: 0.00    Average packs/day: 0.3 packs/day for 36.0 years (9.0 ttl pk-yrs)    Types: Cigarettes    Start date: 08/01/1981    Quit date: 08/01/2017    Years since quitting: 5.7   Smokeless tobacco:  Never   Tobacco comments:    Started smoking at about 73 yrs old  Vaping Use   Vaping status: Never Used  Substance and Sexual Activity   Alcohol use: Not Currently   Drug use: No   Sexual activity: Not Currently    Partners: Male    Birth control/protection: Post-menopausal, Surgical    Comment: Hysterectomy  Other Topics Concern   Not on file  Social History Narrative   Not on file   Social Drivers of Health   Financial Resource Strain: Low Risk  (04/05/2023)   Overall Financial Resource Strain (CARDIA)    Difficulty of Paying Living Expenses: Not hard at all  Food Insecurity: No Food Insecurity (04/05/2023)   Hunger Vital Sign    Worried About Running Out of Food in the Last Year: Never true    Ran Out of Food in the Last Year: Never true  Transportation Needs: No Transportation Needs (04/05/2023)   PRAPARE - Administrator, Civil Service (Medical): No    Lack of Transportation (Non-Medical): No  Physical Activity: Sufficiently Active (04/05/2023)   Exercise Vital Sign    Days of Exercise per Week: 5 days    Minutes of Exercise per Session: 30 min  Stress: No Stress Concern Present (04/05/2023)   [employer] of Occupational [employer] - Occupational Stress Questionnaire    Feeling of Stress : Not at all  Social Connections: Moderately Integrated (04/05/2023)   Social Connection and Isolation Panel [NHANES]    Frequency of Communication with Friends and Family: More than three times a week    Frequency of Social Gatherings with Friends and Family: Once a week    Attends Religious Services: Never    Database administrator or Organizations: No    Attends Engineer, structural: More than 4 times per year    Marital Status: Married  Catering manager Violence: Not At Risk (08/16/2022)   Humiliation, Afraid, Rape, and Kick questionnaire    Fear of Current or Ex-Partner: No    Emotionally Abused: No    Physically Abused: No    Sexually Abused: No   Family History  Problem Relation Age of Onset   Hypertension Mother    Deep vein thrombosis Mother    Alzheimer's disease Mother    Osteopenia Mother    Arthritis Mother    Varicose Veins Mother    Kidney cancer Father 33   Pneumonia Father    Hypertension Father    Arthritis Father    Early death Father    Kidney disease Father    Breast cancer Sister 73       recurrence 92   Cancer Sister    Breast cancer Sister 65       Stage 0   Cancer Sister    Breast cancer Sister 46       Stage 3, bilateral mastectomy   Cancer Sister    Early death Sister    Deep vein thrombosis Sister            Allergies Brother    Early death Brother    Heart attack Brother 42   Breast cancer Maternal Aunt 67   Prostate cancer Maternal Uncle 68   Lung cancer Maternal Uncle 60   Pancreatic cancer Paternal Aunt 70   Pancreatic cancer Paternal Aunt 32   Brain cancer Paternal Aunt    Pancreatic cancer Paternal Uncle 23   Lung cancer Paternal Uncle 73   Heart  attack Maternal Grandfather    Uterine cancer Paternal Grandmother 85   Colon cancer Paternal Grandfather 87   Kidney cancer Cousin 38   Kidney cancer Cousin 52   Uterine cancer Cousin  62   Prostate cancer Cousin 57   Breast cancer Cousin 60   Thyroid disease Neg Hx    Rectal cancer Neg Hx    Stomach cancer Neg Hx    Esophageal cancer Neg Hx    Inflammatory bowel disease Neg Hx    Liver disease Neg Hx    I have reviewed her medical, social, and family history in detail and updated the electronic medical record as necessary.    PHYSICAL EXAMINATION  BP 118/68   Pulse 71   Ht 5\' 6"  (1.676 m)   Wt 200 lb (90.7 kg)   LMP 03/19/1998 Comment: h/o supracervical hysterectomy  BMI 32.28 kg/m  Wt Readings from Last 3 Encounters:  05/14/23 200 lb (90.7 kg)  04/10/23 209 lb 12.8 oz (95.2 kg)  04/03/23 209 lb 9.6 oz (95.1 kg)  GEN: NAD, appears younger than stated age, doesn't appear chronically ill PSYCH: Cooperative, without pressured speech EYE: Conjunctivae pink, sclerae anicteric ENT: MMM CV: Nontachycardic RESP: No wheezing appreciated GI: NABS, soft, NT/ND, without rebound or guarding MSK/EXT: No LE edema SKIN: No jaundice NEURO:  Alert & Oriented x 3, no focal deficits   REVIEW OF DATA  I reviewed the following data at the time of this encounter:  GI Procedures and Studies  October 2023 EGD - No gross lesions in the proximal esophagus and in the mid esophagus. LA Grade A esophagitis with no bleeding found in the very distal esophagus/GE junction. - Z-line regular, 40 cm from the incisors. - 2 cm hiatal hernia. - Erythematous mucosa in the antrum. No other gross lesions in the entire stomach. Biopsied. - No gross lesions in the duodenal bulb, in the first portion of the duodenum and in the second portion of the duodenum.  October 2023 Colonoscopy - Hemorrhoids found on digital rectal exam. - The examined portion of the ileum was normal. - One 4 mm polyp in the sigmoid colon, removed with a cold snare. Resected and retrieved. - Diverticulosis in the recto-sigmoid colon, in the sigmoid colon and in the descending colon. - Normal mucosa in the entire  examined colon otherwise. - Non-bleeding non-thrombosed external and internal hemorrhoids.  Pathology FINAL MICROSCOPIC DIAGNOSIS:  A. STOMACH, BIOPSY:  Fragments of gastric mucosa with minimal vascular ectasia.  H. pylori, intestinal metaplasia, atrophy and dysplasia are not  identified.  B. COLON, SIGMOID, POLYPECTOMY:  Hyperplastic polyp.  Negative for dysplasia.   Laboratory Studies  Reviewed those in EPIC  Imaging Studies  No relevant studies   ASSESSMENT  Ms. Walthall is a 73 y.o. female with a pmh significant for Lynch syndrome, previous brain tumor excision, PAD & Clotting disorder (on Plavix), s/p hysterectomy, prior adrenal insufficiency, colon polyps (TA's), diverticulosis.  The patient is seen today for evaluation and management of:  1. Lynch syndrome   2. Hx of adenomatous colonic polyps   3. Gastroesophageal reflux disease with esophagitis without hemorrhage   4. Antiplatelet or antithrombotic long-term use    The patient is hemodynamically and clinically stable at this time.  She has had very small episodes of rectal bleeding which is most likely hemorrhoidal in nature.  With this being said, she is slightly overdue for her Lynch syndrome colon polyp surveillance/colon cancer screening.  Lynch syndrome recommendation is every 2 to  4 years to also perform upper endoscopy.  She has previous esophagitis though seems to be doing quite well at this time.  The risks and benefits of endoscopic evaluation were discussed with the patient; these include but are not limited to the risk of perforation, infection, bleeding, missed lesions, lack of diagnosis, severe illness requiring hospitalization, as well as anesthesia and sedation related illnesses.  After an extensive discussion with the patient about the risks and benefits, the patient does agree to move forward with colonoscopy and upper endoscopy.  With prior issues of adrenal insufficiency, we will plan to do procedures in the  hospital-based outpatient setting as we have done previously.  She may maintain her current H2 RA blocker.  Hopefully we do not find that she has more significant esophagitis that will require PPI to be considered again versus PCAB.  All patient questions were answered to the best of my ability, and the patient agrees to the aforementioned plan of action with follow-up as indicated.   PLAN  Pepcid 20 mg twice daily as needed Fiber supplementation dietarily and extra if needed Colonoscopy to be scheduled EGD to be scheduled Will obtain approval for antiplatelet therapy hold of Plavix for 5 days prior to procedures   Orders Placed This Encounter  Procedures   Procedural/ Surgical Case Request: COLONOSCOPY WITH PROPOFOL, ESOPHAGOGASTRODUODENOSCOPY (EGD) WITH PROPOFOL    New Prescriptions   No medications on file   Modified Medications   No medications on file    Planned Follow Up No follow-ups on file.   Total Time in Face-to-Face and in Coordination of Care for patient including independent/personal interpretation/review of prior testing, medical history, examination, medication adjustment, communicating results with the patient directly, and documentation within the EHR is 25 minutes.   Corliss Parish, MD [facility] Advanced Endoscopy Office # [phone]

## 2023-05-15 NOTE — Telephone Encounter (Signed)
 Patient informed okay to hold Plavix for 5 days prior to procedure. Will continue ASA. Patient voiced understanding.

## 2023-05-28 DIAGNOSIS — E049 Nontoxic goiter, unspecified: Secondary | ICD-10-CM | POA: Diagnosis not present

## 2023-05-28 DIAGNOSIS — E210 Primary hyperparathyroidism: Secondary | ICD-10-CM | POA: Diagnosis not present

## 2023-05-28 DIAGNOSIS — E230 Hypopituitarism: Secondary | ICD-10-CM | POA: Diagnosis not present

## 2023-05-28 DIAGNOSIS — E2740 Unspecified adrenocortical insufficiency: Secondary | ICD-10-CM | POA: Diagnosis not present

## 2023-05-31 ENCOUNTER — Telehealth: Payer: Self-pay | Admitting: Pharmacist

## 2023-05-31 DIAGNOSIS — I25758 Atherosclerosis of native coronary artery of transplanted heart with other forms of angina pectoris: Secondary | ICD-10-CM

## 2023-05-31 DIAGNOSIS — I739 Peripheral vascular disease, unspecified: Secondary | ICD-10-CM

## 2023-05-31 MED ORDER — REPATHA SURECLICK 140 MG/ML ~~LOC~~ SOAJ: 1.0000 mL | SUBCUTANEOUS | 1 refills | Status: DC

## 2023-05-31 NOTE — Telephone Encounter (Signed)
 PA for Repatha approved. Key: [id number]. Called patient and lmom.

## 2023-05-31 NOTE — Addendum Note (Signed)
 Addended by: Cheree Ditto on: 05/31/2023 05:01 PM   Modules accepted: Orders

## 2023-05-31 NOTE — Telephone Encounter (Signed)
 Patient called to let me know she has a new prescription drug plan:  ID: [id number] BIN: [id number] PCN: [id number] GRP: [id number]

## 2023-06-03 DIAGNOSIS — M51379 Other intervertebral disc degeneration, lumbosacral region without mention of lumbar back pain or lower extremity pain: Secondary | ICD-10-CM | POA: Diagnosis not present

## 2023-06-03 DIAGNOSIS — M47819 Spondylosis without myelopathy or radiculopathy, site unspecified: Secondary | ICD-10-CM | POA: Diagnosis not present

## 2023-06-03 DIAGNOSIS — L4050 Arthropathic psoriasis, unspecified: Secondary | ICD-10-CM | POA: Diagnosis not present

## 2023-06-03 DIAGNOSIS — R7989 Other specified abnormal findings of blood chemistry: Secondary | ICD-10-CM | POA: Diagnosis not present

## 2023-06-03 DIAGNOSIS — E669 Obesity, unspecified: Secondary | ICD-10-CM | POA: Diagnosis not present

## 2023-06-03 DIAGNOSIS — M25569 Pain in unspecified knee: Secondary | ICD-10-CM | POA: Diagnosis not present

## 2023-06-03 DIAGNOSIS — Z79899 Other long term (current) drug therapy: Secondary | ICD-10-CM | POA: Diagnosis not present

## 2023-06-03 DIAGNOSIS — M1990 Unspecified osteoarthritis, unspecified site: Secondary | ICD-10-CM | POA: Diagnosis not present

## 2023-06-06 ENCOUNTER — Telehealth: Payer: Self-pay | Admitting: Pharmacist

## 2023-06-06 ENCOUNTER — Encounter (HOSPITAL_BASED_OUTPATIENT_CLINIC_OR_DEPARTMENT_OTHER): Payer: Self-pay | Admitting: Cardiovascular Disease

## 2023-06-06 NOTE — Telephone Encounter (Signed)
**Note De-identified  [place] Obfuscation** Please advise 

## 2023-06-06 NOTE — Telephone Encounter (Signed)
 Patient called to let me know she had received Repatha. Would like further instruction on how to administer. Offered appt slot today but also advised if she goes to [email or web address] she can watch training videos. Patient will call back if she needs further assistance

## 2023-06-07 DIAGNOSIS — Z1231 Encounter for screening mammogram for malignant neoplasm of breast: Secondary | ICD-10-CM | POA: Diagnosis not present

## 2023-06-07 LAB — HM MAMMOGRAPHY

## 2023-06-12 DIAGNOSIS — L4050 Arthropathic psoriasis, unspecified: Secondary | ICD-10-CM | POA: Diagnosis not present

## 2023-06-12 DIAGNOSIS — M059 Rheumatoid arthritis with rheumatoid factor, unspecified: Secondary | ICD-10-CM | POA: Diagnosis not present

## 2023-06-13 DIAGNOSIS — H20023 Recurrent acute iridocyclitis, bilateral: Secondary | ICD-10-CM | POA: Diagnosis not present

## 2023-06-13 DIAGNOSIS — D443 Neoplasm of uncertain behavior of pituitary gland: Secondary | ICD-10-CM | POA: Diagnosis not present

## 2023-06-13 DIAGNOSIS — H04123 Dry eye syndrome of bilateral lacrimal glands: Secondary | ICD-10-CM | POA: Diagnosis not present

## 2023-06-13 DIAGNOSIS — H401133 Primary open-angle glaucoma, bilateral, severe stage: Secondary | ICD-10-CM | POA: Diagnosis not present

## 2023-06-14 ENCOUNTER — Encounter: Payer: Self-pay | Admitting: Family Medicine

## 2023-06-17 ENCOUNTER — Telehealth: Payer: Self-pay

## 2023-06-17 NOTE — Telephone Encounter (Signed)
 Procedure:Colon Procedure date: 06/24/23 Procedure location: [facility]l Arrival Time: 10am Spoke with the patient Y/N: y Any prep concerns? n  Has the patient obtained the prep from the pharmacy ? y Do you have a care partner and transportation: y Any additional concerns? n

## 2023-06-18 ENCOUNTER — Encounter (HOSPITAL_COMMUNITY): Payer: Self-pay | Admitting: Gastroenterology

## 2023-06-24 ENCOUNTER — Telehealth: Payer: Self-pay

## 2023-06-24 ENCOUNTER — Encounter (HOSPITAL_COMMUNITY): Payer: Self-pay | Admitting: Gastroenterology

## 2023-06-24 ENCOUNTER — Ambulatory Visit (HOSPITAL_COMMUNITY)
Admission: RE | Admit: 2023-06-24 | Discharge: 2023-06-24 | Disposition: A | Discharge status: Home / Self Care | Payer: Medicare Other | Attending: Gastroenterology | Admitting: Gastroenterology

## 2023-06-24 ENCOUNTER — Encounter (HOSPITAL_COMMUNITY)
Admission: RE | Disposition: A | Discharge status: Home / Self Care | Payer: Self-pay | Source: Home / Self Care | Attending: Gastroenterology

## 2023-06-24 ENCOUNTER — Ambulatory Visit (HOSPITAL_COMMUNITY): Admitting: Anesthesiology

## 2023-06-24 ENCOUNTER — Other Ambulatory Visit: Payer: Self-pay

## 2023-06-24 DIAGNOSIS — Z7902 Long term (current) use of antithrombotics/antiplatelets: Secondary | ICD-10-CM | POA: Diagnosis not present

## 2023-06-24 DIAGNOSIS — K5730 Diverticulosis of large intestine without perforation or abscess without bleeding: Secondary | ICD-10-CM | POA: Diagnosis not present

## 2023-06-24 DIAGNOSIS — K449 Diaphragmatic hernia without obstruction or gangrene: Secondary | ICD-10-CM

## 2023-06-24 DIAGNOSIS — Z1509 Genetic susceptibility to other malignant neoplasm: Secondary | ICD-10-CM

## 2023-06-24 DIAGNOSIS — I10 Essential (primary) hypertension: Secondary | ICD-10-CM | POA: Insufficient documentation

## 2023-06-24 DIAGNOSIS — I739 Peripheral vascular disease, unspecified: Secondary | ICD-10-CM | POA: Insufficient documentation

## 2023-06-24 DIAGNOSIS — I2510 Atherosclerotic heart disease of native coronary artery without angina pectoris: Secondary | ICD-10-CM | POA: Diagnosis not present

## 2023-06-24 DIAGNOSIS — Z860101 Personal history of adenomatous and serrated colon polyps: Secondary | ICD-10-CM | POA: Diagnosis not present

## 2023-06-24 DIAGNOSIS — Z860100 Personal history of colon polyps, unspecified: Secondary | ICD-10-CM | POA: Diagnosis not present

## 2023-06-24 DIAGNOSIS — Z79899 Other long term (current) drug therapy: Secondary | ICD-10-CM | POA: Diagnosis not present

## 2023-06-24 DIAGNOSIS — Z85038 Personal history of other malignant neoplasm of large intestine: Secondary | ICD-10-CM

## 2023-06-24 DIAGNOSIS — Z87891 Personal history of nicotine dependence: Secondary | ICD-10-CM | POA: Diagnosis not present

## 2023-06-24 DIAGNOSIS — D122 Benign neoplasm of ascending colon: Secondary | ICD-10-CM

## 2023-06-24 DIAGNOSIS — K3189 Other diseases of stomach and duodenum: Secondary | ICD-10-CM | POA: Diagnosis not present

## 2023-06-24 DIAGNOSIS — K219 Gastro-esophageal reflux disease without esophagitis: Secondary | ICD-10-CM | POA: Insufficient documentation

## 2023-06-24 DIAGNOSIS — K209 Esophagitis, unspecified without bleeding: Secondary | ICD-10-CM | POA: Diagnosis not present

## 2023-06-24 DIAGNOSIS — K641 Second degree hemorrhoids: Secondary | ICD-10-CM

## 2023-06-24 DIAGNOSIS — F32A Depression, unspecified: Secondary | ICD-10-CM | POA: Diagnosis not present

## 2023-06-24 DIAGNOSIS — K2100 Gastro-esophageal reflux disease with esophagitis, without bleeding: Secondary | ICD-10-CM

## 2023-06-24 DIAGNOSIS — Z1211 Encounter for screening for malignant neoplasm of colon: Secondary | ICD-10-CM | POA: Diagnosis not present

## 2023-06-24 HISTORY — PX: ESOPHAGOGASTRODUODENOSCOPY (EGD) WITH PROPOFOL: SHX5813

## 2023-06-24 HISTORY — PX: COLONOSCOPY WITH PROPOFOL: SHX5780

## 2023-06-24 HISTORY — PX: POLYPECTOMY: SHX149

## 2023-06-24 HISTORY — PX: BONE BIOPSY: SHX375

## 2023-06-24 SURGERY — COLONOSCOPY WITH PROPOFOL
Anesthesia: Monitor Anesthesia Care

## 2023-06-24 MED ORDER — PHENYLEPHRINE 80 MCG/ML (10ML) SYRINGE FOR IV PUSH (FOR BLOOD PRESSURE SUPPORT)
PREFILLED_SYRINGE | INTRAVENOUS | Status: DC | PRN
Start: 2023-06-24 — End: 2023-06-24
  Administered 2023-06-24: 80 ug via INTRAVENOUS

## 2023-06-24 MED ORDER — LIDOCAINE 2% (20 MG/ML) 5 ML SYRINGE
INTRAMUSCULAR | Status: DC | PRN
  Administered 2023-06-24: 100 mg via INTRAVENOUS

## 2023-06-24 MED ORDER — PROPOFOL 500 MG/50ML IV EMUL
INTRAVENOUS | Status: DC | PRN
  Administered 2023-06-24: 200 ug/kg/min via INTRAVENOUS

## 2023-06-24 MED ORDER — SODIUM CHLORIDE 0.9 % IV SOLN: INTRAVENOUS | Status: DC

## 2023-06-24 MED ORDER — PROPOFOL 1000 MG/100ML IV EMUL
INTRAVENOUS | Status: AC
Start: 2023-06-24 — End: ?
  Filled 2023-06-24: qty 100

## 2023-06-24 MED ORDER — EPHEDRINE SULFATE-NACL 50-0.9 MG/10ML-% IV SOSY
PREFILLED_SYRINGE | INTRAVENOUS | Status: DC | PRN
  Administered 2023-06-24: 10 mg via INTRAVENOUS

## 2023-06-24 SURGICAL SUPPLY — 23 items
BLOCK BITE 60FR ADLT L/F BLUE (MISCELLANEOUS) ×2 IMPLANT
ELECT REM PT RETURN 9FT ADLT (ELECTROSURGICAL) IMPLANT
ELECTRODE REM PT RTRN 9FT ADLT (ELECTROSURGICAL) IMPLANT
FLOOR PAD 36X40 (MISCELLANEOUS) ×2 IMPLANT
FORCEP RJ3 GP 1.8X160 W-NEEDLE (CUTTING FORCEPS) IMPLANT
FORCEPS BIOP RAD 4 LRG CAP 4 (CUTTING FORCEPS) IMPLANT
FORCEPS BIOP RJ4 240 W/NDL (CUTTING FORCEPS) IMPLANT
FORCEPS BXJMBJMB 240X2.8X (CUTTING FORCEPS) IMPLANT
INJECTOR/SNARE I SNARE (MISCELLANEOUS) IMPLANT
LUBRICANT JELLY 4.5OZ STERILE (MISCELLANEOUS) IMPLANT
MANIFOLD NEPTUNE II (INSTRUMENTS) IMPLANT
NDL SCLEROTHERAPY 25GX240 (NEEDLE) IMPLANT
NEEDLE SCLEROTHERAPY 25GX240 (NEEDLE) IMPLANT
PAD FLOOR 36X40 (MISCELLANEOUS) ×2 IMPLANT
PROBE APC STR FIRE (PROBE) IMPLANT
PROBE INJECTION GOLD 7FR (MISCELLANEOUS) IMPLANT
SNARE ROTATE MED OVAL 20MM (MISCELLANEOUS) IMPLANT
SNARE SHORT THROW 13M SML OVAL (MISCELLANEOUS) IMPLANT
SYR 50ML LL SCALE MARK (SYRINGE) IMPLANT
TRAP SPECIMEN MUCOUS 40CC (MISCELLANEOUS) IMPLANT
TUBING ENDO SMARTCAP PENTAX (MISCELLANEOUS) ×4 IMPLANT
TUBING IRRIGATION ENDOGATOR (MISCELLANEOUS) ×2 IMPLANT
WATER STERILE IRR 1000ML POUR (IV SOLUTION) IMPLANT

## 2023-06-24 NOTE — Anesthesia Preprocedure Evaluation (Signed)
 Anesthesia Evaluation  Patient identified by MRN, date of birth, ID band Patient awake    Reviewed: Allergy & Precautions, NPO status , Patient's Chart, lab work & pertinent test results, reviewed documented beta blocker date and time   Airway Mallampati: II  TM Distance: >3 FB Neck ROM: Full    Dental  (+) Teeth Intact, Dental Advisory Given   Pulmonary former smoker   Pulmonary exam normal breath sounds clear to auscultation       Cardiovascular hypertension, Pt. on home beta blockers + CAD and + Peripheral Vascular Disease  Normal cardiovascular exam Rhythm:Regular Rate:Normal     Neuro/Psych  PSYCHIATRIC DISORDERS  Depression     Neuromuscular disease    GI/Hepatic Neg liver ROS,GERD  Medicated,,Hx of colon polyps, Lynch Syndrome, Gerd   Endo/Other  negative endocrine ROS    Renal/GU negative Renal ROS     Musculoskeletal  (+) Arthritis ,    Abdominal   Peds  Hematology  (+) Blood dyscrasia (Plavix)   Anesthesia Other Findings Day of surgery medications reviewed with the patient.  Reproductive/Obstetrics                             Anesthesia Physical Anesthesia Plan  ASA: 3  Anesthesia Plan: MAC   Post-op Pain Management:    Induction: Intravenous  PONV Risk Score and Plan: 2 and TIVA and Treatment may vary due to age or medical condition  Airway Management Planned: Natural Airway and Simple Face Mask  Additional Equipment:   Intra-op Plan:   Post-operative Plan:   Informed Consent: I have reviewed the patients History and Physical, chart, labs and discussed the procedure including the risks, benefits and alternatives for the proposed anesthesia with the patient or authorized representative who has indicated his/her understanding and acceptance.     Dental advisory given  Plan Discussed with: CRNA and Anesthesiologist  Anesthesia Plan Comments:         Anesthesia Quick Evaluation

## 2023-06-24 NOTE — Telephone Encounter (Signed)
Recall has been entered as ordered  

## 2023-06-24 NOTE — Transfer of Care (Signed)
 Immediate Anesthesia Transfer of Care Note  Patient: Pamela Lowe  Procedure(s) Performed: COLONOSCOPY WITH PROPOFOL ESOPHAGOGASTRODUODENOSCOPY (EGD) WITH PROPOFOL BIOPSY POLYPECTOMY, INTESTINE  Patient Location: PACU  Anesthesia Type:MAC  Level of Consciousness: awake and alert   Airway & Oxygen Therapy: Patient Spontanous Breathing  Post-op Assessment: Report given to RN and Post -op Vital signs reviewed and stable  Post vital signs: Reviewed and stable  Last Vitals:  Vitals Value Taken Time  BP 102/58 06/24/23 1108  Temp    Pulse 71 06/24/23 1109  Resp 17 06/24/23 1109  SpO2 100 % 06/24/23 1109  Vitals shown include unfiled device data.  Last Pain:  Vitals:   06/24/23 1108  TempSrc: Temporal  PainSc: 0-No pain         Complications: No notable events documented.

## 2023-06-24 NOTE — H&P (Signed)
 GASTROENTEROLOGY PROCEDURE H&P NOTE   Primary Care Physician: Shade Flood, MD  HPI: Pamela Lowe is a 73 y.o. female who presents for EGD/Colonoscopy for followup of GERD & Esophagitis and High Risk screening/surveillance in setting of Lynch Syndrome.  Past Medical History:  Diagnosis Date   Allergic rhinitis    Allergy    Arthritis    Brain tumor (HCC)    Breast cyst 2007   Right   Cataract    surgery 07/2017   Clotting disorder (HCC)    On plavix since stents  for artery blockages   Depression 2006   w/ menopause   Depression screening 09/27/2021   Family history of adverse reaction to anesthesia    sister PONV   Family history of breast cancer    Family history of colon cancer    Family history of kidney cancer 01/15/2018   Family history of lung cancer    Family history of pancreatic cancer    Family history of prostate cancer    Family history of uterine cancer    GERD (gastroesophageal reflux disease)    Glaucoma    Heart murmur    History of hysterectomy, supracervical    Hyperlipidemia    Hypertension    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   MLH1 [id number] (Splice site)   Multinodular goiter    PAD (peripheral artery disease) (HCC)    LE Art [place] 1/18: Occluded prox-mid R SFA // ABIs 9/22: R 0.83; L 1.0   Pustular psoriasis    Tobacco user    Past Surgical History:  Procedure Laterality Date   BIOPSY  01/15/2022   Procedure: BIOPSY;  Surgeon: Lemar Lofty., MD;  Location: [facility];  Service: Gastroenterology;;   BRAIN TUMOR EXCISION  02/24/2021   at [facility]   BREAST BIOPSY Bilateral 1985   CARDIAC CATHETERIZATION     CATARACT EXTRACTION Right 07/2017   COLONOSCOPY     COLONOSCOPY WITH PROPOFOL N/A 01/15/2022   Procedure: COLONOSCOPY WITH PROPOFOL;  Surgeon: Lemar Lofty., MD;  Location: [facility];  Service: Gastroenterology;  Laterality: N/A;   CORONARY STENT INTERVENTION N/A 12/26/2018   Procedure: CORONARY  STENT INTERVENTION;  Surgeon: Lyn Records, MD;  Location: [facility];  Service: Cardiovascular;  Laterality: N/A;   ESOPHAGOGASTRODUODENOSCOPY (EGD) WITH PROPOFOL N/A 01/15/2022   Procedure: ESOPHAGOGASTRODUODENOSCOPY (EGD) WITH PROPOFOL;  Surgeon: Meridee Score Netty Starring., MD;  Location: [facility];  Service: Gastroenterology;  Laterality: N/A;   EYE SURGERY  2019, 2020   Cataracts, eye shunt implants   LEFT HEART CATH AND CORONARY ANGIOGRAPHY N/A 12/17/2018   Procedure: LEFT HEART CATH AND CORONARY ANGIOGRAPHY;  Surgeon: Lyn Records, MD;  Location: [facility];  Service: Cardiovascular;  Laterality: N/A;   POLYPECTOMY  01/15/2022   Procedure: POLYPECTOMY;  Surgeon: Meridee Score Netty Starring., MD;  Location: [facility];  Service: Gastroenterology;;   SUPRACERVICAL ABDOMINAL HYSTERECTOMY  1986   UPPER GASTROINTESTINAL ENDOSCOPY     No current facility-administered medications for this encounter.   No current facility-administered medications for this encounter. Allergies  Allergen Reactions   Sulfamethoxazole-Trimethoprim Other (See Comments) and Rash    Chills/achy Flu like symptoms    Conjugated Estrogens     Other reaction(s): rash   Wellbutrin [Bupropion Hcl] Rash    rash   Family History  Problem Relation Age of Onset   Hypertension Mother    Deep vein thrombosis Mother    Alzheimer's disease Mother    Osteopenia Mother  Arthritis Mother    Varicose Veins Mother    Kidney cancer Father 58   Pneumonia Father    Hypertension Father    Arthritis Father    Early death Father    Kidney disease Father    Breast cancer Sister 38       recurrence 71   Cancer Sister    Breast cancer Sister 36       Stage 0   Cancer Sister    Breast cancer Sister 59       Stage 3, bilateral mastectomy   Cancer Sister    Early death Sister    Deep vein thrombosis Sister            Allergies Brother    Early death Brother    Heart attack Brother 52   Breast cancer  Maternal Aunt 67   Prostate cancer Maternal Uncle 23   Lung cancer Maternal Uncle 60   Pancreatic cancer Paternal Aunt 6   Pancreatic cancer Paternal Aunt 57   Brain cancer Paternal Aunt    Pancreatic cancer Paternal Uncle 17   Lung cancer Paternal Uncle 50   Heart attack Maternal Grandfather    Uterine cancer Paternal Grandmother 52   Colon cancer Paternal Grandfather 50   Kidney cancer Cousin 56   Kidney cancer Cousin 52   Uterine cancer Cousin 62   Prostate cancer Cousin 57   Breast cancer Cousin 60   Thyroid disease Neg Hx    Rectal cancer Neg Hx    Stomach cancer Neg Hx    Esophageal cancer Neg Hx    Inflammatory bowel disease Neg Hx    Liver disease Neg Hx    Social History   Socioeconomic History   Marital status: Married    Spouse name: Tasia Catchings   Number of children: 0   Years of education: Not on file   Highest education level: Master's degree (e.g., MA, MS, MEng, MEd, MSW, MBA)  Occupational History   Occupation: Special Ed Runner, broadcasting/film/video    Comment: Dentist  Tobacco Use   Smoking status: Former    Current packs/day: 0.00    Average packs/day: 0.3 packs/day for 36.0 years (9.0 ttl pk-yrs)    Types: Cigarettes    Start date: 08/01/1981    Quit date: 08/01/2017    Years since quitting: 5.8   Smokeless tobacco: Never   Tobacco comments:    Started smoking at about 73 yrs old  Vaping Use   Vaping status: Never Used  Substance and Sexual Activity   Alcohol use: Not Currently   Drug use: No   Sexual activity: Not Currently    Partners: Male    Birth control/protection: Post-menopausal, Surgical    Comment: Hysterectomy  Other Topics Concern   Not on file  Social History Narrative   Not on file   Social Drivers of Health   Financial Resource Strain: Low Risk  (04/05/2023)   Overall Financial Resource Strain (CARDIA)    Difficulty of Paying Living Expenses: Not hard at all  Food Insecurity: No Food Insecurity (04/05/2023)   Hunger Vital Sign    Worried  About Running Out of Food in the Last Year: Never true    Ran Out of Food in the Last Year: Never true  Transportation Needs: No Transportation Needs (04/05/2023)   PRAPARE - Administrator, Civil Service (Medical): No    Lack of Transportation (Non-Medical): No  Physical Activity: Sufficiently Active (04/05/2023)   Exercise  Vital Sign    Days of Exercise per Week: 5 days    Minutes of Exercise per Session: 30 min  Stress: No Stress Concern Present (04/05/2023)   [employer] of Occupational Health - Occupational Stress Questionnaire    Feeling of Stress : Not at all  Social Connections: Moderately Integrated (04/05/2023)   Social Connection and Isolation Panel [NHANES]    Frequency of Communication with Friends and Family: More than three times a week    Frequency of Social Gatherings with Friends and Family: Once a week    Attends Religious Services: Never    Database administrator or Organizations: No    Attends Engineer, structural: More than 4 times per year    Marital Status: Married  Catering manager Violence: Not At Risk (08/16/2022)   Humiliation, Afraid, Rape, and Kick questionnaire    Fear of Current or Ex-Partner: No    Emotionally Abused: No    Physically Abused: No    Sexually Abused: No    Physical Exam: There were no vitals filed for this visit. There is no height or weight on file to calculate BMI. GEN: NAD EYE: Sclerae anicteric ENT: MMM CV: Non-tachycardic GI: Soft, NT/ND NEURO:  Alert & Oriented x 3  Lab Results: No results for input(s): "WBC", "HGB", "HCT", "PLT" in the last 72 hours. BMET No results for input(s): "NA", "K", "CL", "CO2", "GLUCOSE", "BUN", "CREATININE", "CALCIUM" in the last 72 hours. LFT No results for input(s): "PROT", "ALBUMIN", "AST", "ALT", "ALKPHOS", "BILITOT", "BILIDIR", "IBILI" in the last 72 hours. PT/INR No results for input(s): "LABPROT", "INR" in the last 72 hours.   Impression / Plan: This is a 73  y.o.female who presents for EGD/Colonoscopy for followup of GERD & Esophagitis and High Risk screening/surveillance in setting of Lynch Syndrome.  The risks and benefits of endoscopic evaluation/treatment were discussed with the patient and/or family; these include but are not limited to the risk of perforation, infection, bleeding, missed lesions, lack of diagnosis, severe illness requiring hospitalization, as well as anesthesia and sedation related illnesses.  The patient's history has been reviewed, patient examined, no change in status, and deemed stable for procedure.  The patient and/or family is agreeable to proceed.    Corliss Parish, MD [facility]oscopy Office # [phone]

## 2023-06-24 NOTE — Op Note (Signed)
 [facility] Patient Name: Pamela Lowe Procedure Date: 06/24/2023 MRN: [id number] Attending MD: Corliss Parish , MD, [id number] Date of Birth: Dec 12, 1950 CSN: [id number] Age: 73 Admit Type: Outpatient Procedure:                Upper GI endoscopy Indications:              Surveillance for malignancy secondary to Lynch                            Syndrome, Follow-up of esophagitis Providers:                Corliss Parish, MD, Norman Clay, RN, Kandice Robinsons, Technician Referring MD:             Corliss Parish, MD Medicines:                Monitored Anesthesia Care Complications:            No immediate complications. Estimated Blood Loss:     Estimated blood loss was minimal. Procedure:                Pre-Anesthesia Assessment:                           - Prior to the procedure, a History and Physical                            was performed, and patient medications and                            allergies were reviewed. The patient's tolerance of                            previous anesthesia was also reviewed. The risks                            and benefits of the procedure and the sedation                            options and risks were discussed with the patient.                            All questions were answered, and informed consent                            was obtained. Prior Anticoagulants: The patient has                            taken no anticoagulant or antiplatelet agents. ASA                            Grade Assessment: III - A patient with severe  systemic disease. After reviewing the risks and                            benefits, the patient was deemed in satisfactory                            condition to undergo the procedure.                           After obtaining informed consent, the endoscope was                            passed under direct vision. Throughout the                             procedure, the patient's blood pressure, pulse, and                            oxygen saturations were monitored continuously. The                            [id number]-H190 ([id number]) Olympus endoscope was introduced                            through the mouth, and advanced to the second part                            of duodenum. The upper GI endoscopy was                            accomplished without difficulty. The patient                            tolerated the procedure. Scope In: Scope Out: Findings:      No gross lesions were noted in the proximal esophagus and in the mid       esophagus.      LA Grade A (one or more mucosal breaks less than 5 mm, not extending       between tops of 2 mucosal folds) esophagitis with no bleeding was found       in the distal esophagus.      A 2 cm hiatal hernia was present.      Patchy mildly erythematous mucosa without bleeding was found in the       cardia, in the gastric fundus and in the gastric antrum.      No other gross lesions were noted in the entire examined stomach.       Biopsies were taken with a cold forceps for histology and Helicobacter       pylori testing.      No gross lesions were noted in the duodenal bulb, in the first portion       of the duodenum and in the second portion of the duodenum. Impression:               - No gross lesions in the proximal esophagus and in  the mid esophagus. LA Grade A esophagitis with no                            bleeding found distally.                           - 2 cm hiatal hernia.                           - Erythematous mucosa in the cardia, gastric fundus                            and antrum. No other gross lesions in the entire                            stomach. Biopsied.                           - No gross lesions in the duodenal bulb, in the                            first portion of the duodenum and in the second                             portion of the duodenum. Moderate Sedation:      Not Applicable - Patient had care per Anesthesia. Recommendation:           - Proceed to scheduled colonoscopy.                           - Consider initiation of PPI versus PCAB for                            healing of esophagitis.                           - Observe patient's clinical course.                           - Await pathology results.                           - Consideration of repeat upper endoscopy can be                            had, though this remains just grade A esophagitis.                           - Surveillance for underlying Lynch syndrome, is                            every 2 to 4 years (though consider earlier if                            repeat endoscopy for esophagitis is warranted).                           -  The findings and recommendations were discussed                            with the patient.                           - The findings and recommendations were discussed                            with the patient's family. Procedure Code(s):        --- Professional ---                           [phone], Esophagogastroduodenoscopy, flexible,                            transoral; with biopsy, single or multiple Diagnosis Code(s):        --- Professional ---                           K20.90, Esophagitis, unspecified without bleeding                           K44.9, Diaphragmatic hernia without obstruction or                            gangrene                           K31.89, Other diseases of stomach and duodenum                           Z15.09, Genetic susceptibility to other malignant                            neoplasm CPT copyright 2022 American Medical Association. All rights reserved. The codes documented in this report are preliminary and upon coder review may  be revised to meet current compliance requirements. Corliss Parish, MD 06/24/2023 11:10:31 AM Number of Addenda: 0

## 2023-06-24 NOTE — Telephone Encounter (Signed)
-----   Message from [facility] sent at 06/24/2023 11:23 AM EDT ----- Regarding: Follow-up This patient needs a 78-month follow-up with APP or myself for GERD and esophagitis. Thanks. GM

## 2023-06-24 NOTE — Anesthesia Postprocedure Evaluation (Signed)
 Anesthesia Post Note  Patient: Pamela Lowe  Procedure(s) Performed: COLONOSCOPY WITH PROPOFOL ESOPHAGOGASTRODUODENOSCOPY (EGD) WITH PROPOFOL BIOPSY POLYPECTOMY, INTESTINE     Patient location during evaluation: Endoscopy Anesthesia Type: MAC Level of consciousness: oriented, awake and alert and awake Pain management: pain level controlled Vital Signs Assessment: post-procedure vital signs reviewed and stable Respiratory status: spontaneous breathing, nonlabored ventilation, respiratory function stable and patient connected to nasal cannula oxygen Cardiovascular status: blood pressure returned to baseline and stable Postop Assessment: no headache, no backache and no apparent nausea or vomiting Anesthetic complications: no   No notable events documented.  Last Vitals:  Vitals:   06/24/23 1110 06/24/23 1120  BP: 101/61 109/60  Pulse: 67 71  Resp: 19 15  Temp:    SpO2: 100% 98%    Last Pain:  Vitals:   06/24/23 1120  TempSrc:   PainSc: 0-No pain                 Collene Schlichter

## 2023-06-24 NOTE — Discharge Instructions (Signed)
YOU HAD AN ENDOSCOPIC PROCEDURE TODAY: Refer to the procedure report and other information in the discharge instructions given to you for any specific questions about what was found during the examination. If this information does not answer your questions, please call  office at [phone] to clarify.  ° °YOU SHOULD EXPECT: Some feelings of bloating in the abdomen. Passage of more gas than usual. Walking can help get rid of the air that was put into your GI tract during the procedure and reduce the bloating. If you had a lower endoscopy (such as a colonoscopy or flexible sigmoidoscopy) you may notice spotting of blood in your stool or on the toilet paper. Some abdominal soreness may be present for a day or two, also. ° °DIET: Your first meal following the procedure should be a light meal and then it is ok to progress to your normal diet. A half-sandwich or bowl of soup is an example of a good first meal. Heavy or fried foods are harder to digest and may make you feel nauseous or bloated. Drink plenty of fluids but you should avoid alcoholic beverages for 24 hours. If you had a esophageal dilation, please see attached instructions for diet.   ° °ACTIVITY: Your care partner should take you home directly after the procedure. You should plan to take it easy, moving slowly for the rest of the day. You can resume normal activity the day after the procedure however YOU SHOULD NOT DRIVE, use power tools, machinery or perform tasks that involve climbing or major physical exertion for 24 hours (because of the sedation medicines used during the test).  ° °SYMPTOMS TO REPORT IMMEDIATELY: °A gastroenterologist can be reached at any hour. Please call [phone]  for any of the following symptoms:  °Following lower endoscopy (colonoscopy, flexible sigmoidoscopy) °Excessive amounts of blood in the stool  °Significant tenderness, worsening of abdominal pains  °Swelling of the abdomen that is new, acute  °Fever of 100° or  higher  °Following upper endoscopy (EGD, EUS, ERCP, esophageal dilation) °Vomiting of blood or coffee ground material  °New, significant abdominal pain  °New, significant chest pain or pain under the shoulder blades  °Painful or persistently difficult swallowing  °New shortness of breath  °Black, tarry-looking or red, bloody stools ° °FOLLOW UP:  °If any biopsies were taken you will be contacted by phone or by letter within the next 1-3 weeks. Call [phone]  if you have not heard about the biopsies in 3 weeks.  °Please also call with any specific questions about appointments or follow up tests. ° °

## 2023-06-24 NOTE — Op Note (Addendum)
 [facility] Patient Name: Pamela Lowe Procedure Date: 06/24/2023 MRN: [id number] Attending MD: Corliss Parish , MD, [id number] Date of Birth: October 09, 1950 CSN: [id number] Age: 73 Admit Type: Outpatient Procedure:                Colonoscopy Indications:              High risk colon cancer surveillance: Personal                            history of colonic polyps, High risk colon cancer                            surveillance: Personal history of hereditary                            nonpolyposis colorectal cancer (Lynch Syndrome) Providers:                Corliss Parish, MD, Norman Clay, RN, Kandice Robinsons, Technician Referring MD:             Corliss Parish, MD Medicines:                Monitored Anesthesia Care Complications:            No immediate complications. Estimated Blood Loss:     Estimated blood loss was minimal. Procedure:                Pre-Anesthesia Assessment:                           - Prior to the procedure, a History and Physical                            was performed, and patient medications and                            allergies were reviewed. The patient's tolerance of                            previous anesthesia was also reviewed. The risks                            and benefits of the procedure and the sedation                            options and risks were discussed with the patient.                            All questions were answered, and informed consent                            was obtained. Prior Anticoagulants: The patient has  taken no anticoagulant or antiplatelet agents. ASA                            Grade Assessment: III - A patient with severe                            systemic disease. After reviewing the risks and                            benefits, the patient was deemed in satisfactory                            condition to undergo the procedure.                            After obtaining informed consent, the colonoscope                            was passed under direct vision. Throughout the                            procedure, the patient's blood pressure, pulse, and                            oxygen saturations were monitored continuously. The                            CF-HQ190L ([id number] Olympus colonoscope was                            introduced through the anus and advanced to the 3                            cm into the ileum. The colonoscopy was performed                            without difficulty. The patient tolerated the                            procedure. The quality of the bowel preparation was                            good. The terminal ileum, ileocecal valve,                            appendiceal orifice, and rectum were photographed. Scope In: 10:48:11 AM Scope Out: 10:59:46 AM Scope Withdrawal Time: 0 hours 7 minutes 57 seconds  Total Procedure Duration: 0 hours 11 minutes 35 seconds  Findings:      The digital rectal exam findings include hemorrhoids. Pertinent       negatives include no palpable rectal lesions.      The terminal ileum and ileocecal valve appeared normal.      A 4 mm polyp was found in the ascending colon. The polyp was sessile.  The polyp was removed with a cold snare. Resection and retrieval were       complete.      Multiple small-mouthed diverticula were found in the recto-sigmoid colon       and sigmoid colon.      Normal mucosa was found in the entire colon otherwise.      Non-bleeding non-thrombosed external and internal hemorrhoids were found       during retroflexion, during perianal exam and during digital exam. The       hemorrhoids were Grade II (internal hemorrhoids that prolapse but reduce       spontaneously). Impression:               - Hemorrhoids found on digital rectal exam.                           - The examined portion of the ileum was normal.                            - One 4 mm polyp in the ascending colon, removed                            with a cold snare. Resected and retrieved.                           - Diverticulosis in the recto-sigmoid colon and in                            the sigmoid colon.                           - Normal mucosa in the entire examined colon                            otherwise.                           - Non-bleeding non-thrombosed external and internal                            hemorrhoids. Moderate Sedation:      Not Applicable - Patient had care per Anesthesia. Recommendation:           - The patient will be observed post-procedure,                            until all discharge criteria are met.                           - Discharge patient to home.                           - Patient has a contact number available for                            emergencies. The signs and symptoms of potential  delayed complications were discussed with the                            patient. Return to normal activities tomorrow.                            Written discharge instructions were provided to the                            patient.                           - High fiber diet.                           - Use FiberCon 1-2 tablets PO daily.                           - May restart Plavix tomorrow to decrease risk of                            post interventional bleeding.                           - Continue present medications.                           - Await pathology results.                           - Repeat colonoscopy in 1 year for                            surveillance/high risk screening in the setting of                            underlying Lynch syndrome.                           - The findings and recommendations were discussed                            with the patient.                           - The findings and recommendations were discussed                             with the patient's family. Procedure Code(s):        --- Professional ---                           [phone], Colonoscopy, flexible; with removal of                            tumor(s), polyp(s), or other lesion(s) by snare  technique Diagnosis Code(s):        --- Professional ---                           Z86.010, Personal history of colonic polyps                           Z85.038, Personal history of other malignant                            neoplasm of large intestine                           Z15.09, Genetic susceptibility to other malignant                            neoplasm                           K64.1, Second degree hemorrhoids                           D12.2, Benign neoplasm of ascending colon                           K57.30, Diverticulosis of large intestine without                            perforation or abscess without bleeding CPT copyright 2022 American Medical Association. All rights reserved. The codes documented in this report are preliminary and upon coder review may  be revised to meet current compliance requirements. Corliss Parish, MD 06/24/2023 11:14:03 AM Number of Addenda: 0

## 2023-06-25 LAB — SURGICAL PATHOLOGY

## 2023-06-26 ENCOUNTER — Encounter (HOSPITAL_COMMUNITY): Payer: Self-pay | Admitting: Gastroenterology

## 2023-06-26 ENCOUNTER — Encounter: Payer: Self-pay | Admitting: Gastroenterology

## 2023-06-27 ENCOUNTER — Other Ambulatory Visit (INDEPENDENT_AMBULATORY_CARE_PROVIDER_SITE_OTHER)

## 2023-06-27 DIAGNOSIS — N1831 Chronic kidney disease, stage 3a: Secondary | ICD-10-CM

## 2023-06-27 DIAGNOSIS — R7989 Other specified abnormal findings of blood chemistry: Secondary | ICD-10-CM

## 2023-06-27 LAB — COMPREHENSIVE METABOLIC PANEL WITH GFR
ALT: 33 U/L (ref 0–35)
AST: 38 U/L — ABNORMAL HIGH (ref 0–37)
Albumin: 4 g/dL (ref 3.5–5.2)
Alkaline Phosphatase: 48 U/L (ref 39–117)
BUN: 19 mg/dL (ref 6–23)
CO2: 32 meq/L (ref 19–32)
Calcium: 9.6 mg/dL (ref 8.4–10.5)
Chloride: 104 meq/L (ref 96–112)
Creatinine, Ser: 1.17 mg/dL (ref 0.40–1.20)
GFR: 46.78 mL/min — ABNORMAL LOW (ref >60.00)
Glucose, Bld: 105 mg/dL — ABNORMAL HIGH (ref 70–99)
Potassium: 3.7 meq/L (ref 3.5–5.1)
Sodium: 141 meq/L (ref 135–145)
Total Bilirubin: 1.7 mg/dL — ABNORMAL HIGH (ref 0.2–1.2)
Total Protein: 6.8 g/dL (ref 6.0–8.3)

## 2023-06-28 ENCOUNTER — Encounter: Payer: Self-pay | Admitting: Family Medicine

## 2023-06-28 ENCOUNTER — Other Ambulatory Visit (HOSPITAL_BASED_OUTPATIENT_CLINIC_OR_DEPARTMENT_OTHER): Payer: Self-pay | Admitting: Obstetrics & Gynecology

## 2023-06-28 DIAGNOSIS — Z1509 Genetic susceptibility to other malignant neoplasm: Secondary | ICD-10-CM

## 2023-07-01 ENCOUNTER — Telehealth: Payer: Self-pay

## 2023-07-01 NOTE — Telephone Encounter (Signed)
 Spoke with Pamela Lowe regarding her referral to GYN oncology. She has an appointment scheduled with Dr. Orvil Bland on 08/02/23 at 9:00. Patient agrees to date and time. She has been provided with office address and location. She is also aware of our mask and visitor policy. Patient verbalized understanding and will call with any questions.

## 2023-07-02 ENCOUNTER — Encounter (HOSPITAL_BASED_OUTPATIENT_CLINIC_OR_DEPARTMENT_OTHER): Payer: Self-pay | Admitting: Obstetrics & Gynecology

## 2023-07-02 NOTE — Telephone Encounter (Signed)
 Called pt in response to myChart message. Informed pt that she was referred to Dr Orvil Bland because she has lynch syndrome and needs removal of cervix (h/o hysterectomy) and ovaries. Advised that Dr. Annabell Key discussed her case with Dr. Orvil Bland who agreed to see her for laparoscopic cervix removal and bilateral oophorectomy. Pt was appreciative of information

## 2023-07-10 ENCOUNTER — Encounter: Payer: Self-pay | Admitting: Family Medicine

## 2023-07-10 ENCOUNTER — Ambulatory Visit (INDEPENDENT_AMBULATORY_CARE_PROVIDER_SITE_OTHER): Payer: Medicare Other | Admitting: Family Medicine

## 2023-07-10 VITALS — BP 116/68 | HR 70 | Temp 98.0°F | Ht 67.0 in | Wt 192.2 lb

## 2023-07-10 DIAGNOSIS — I10 Essential (primary) hypertension: Secondary | ICD-10-CM | POA: Diagnosis not present

## 2023-07-10 DIAGNOSIS — R7303 Prediabetes: Secondary | ICD-10-CM | POA: Diagnosis not present

## 2023-07-10 DIAGNOSIS — R7989 Other specified abnormal findings of blood chemistry: Secondary | ICD-10-CM | POA: Diagnosis not present

## 2023-07-10 DIAGNOSIS — E782 Mixed hyperlipidemia: Secondary | ICD-10-CM | POA: Diagnosis not present

## 2023-07-10 LAB — COMPREHENSIVE METABOLIC PANEL WITH GFR
ALT: 28 U/L (ref 0–35)
AST: 36 U/L (ref 0–37)
Albumin: 4.1 g/dL (ref 3.5–5.2)
Alkaline Phosphatase: 54 U/L (ref 39–117)
BUN: 22 mg/dL (ref 6–23)
CO2: 33 meq/L — ABNORMAL HIGH (ref 19–32)
Calcium: 10 mg/dL (ref 8.4–10.5)
Chloride: 101 meq/L (ref 96–112)
Creatinine, Ser: 1.16 mg/dL (ref 0.40–1.20)
GFR: 47.25 mL/min — ABNORMAL LOW (ref >60.00)
Glucose, Bld: 110 mg/dL — ABNORMAL HIGH (ref 70–99)
Potassium: 3.5 meq/L (ref 3.5–5.1)
Sodium: 140 meq/L (ref 135–145)
Total Bilirubin: 1.3 mg/dL — ABNORMAL HIGH (ref 0.2–1.2)
Total Protein: 7.2 g/dL (ref 6.0–8.3)

## 2023-07-10 LAB — HEMOGLOBIN A1C: Hgb A1c MFr Bld: 5.4 % (ref 4.6–6.5)

## 2023-07-10 NOTE — Progress Notes (Signed)
 Subjective:  Patient ID: Pamela Lowe, female    DOB: 09-11-1950  Age: 73 y.o. MRN: [id number]  CC:  Chief Complaint  Patient presents with   Medical Management of Chronic Issues    Pt notes doing okay, no questions     HPI Pamela Lowe presents for    Follow up.  Recent colonoscopy endoscopy. Pepcid BID for inflamed esophagus. Lynch syndrome.   Hypertension: With CKD stage IIIa presumably nephrosclerosis due to hypertension, age and atherosclerosis plus or minus use of her methotrexate when discussed with nephrology previously.  Avoids NSAIDs.  She is followed by rheumatology with history of rheumatoid arthritis and adrenal insufficiency status post meningioma removal. Hypertension treated with hydrochlorothiazide  12.5 mg daily as well as Toprol . Home readings: 120/70 range.  No med side effects of BP meds.  Off methotrexate  - now on Arava. Some diarrhea with new med. Followed by rheumatology. Improving.   BP Readings from Last 3 Encounters:  07/10/23 116/68  06/24/23 109/60  05/14/23 118/68   Lab Results  Component Value Date   CREATININE 1.17 06/27/2023   Hyperlipidemia: Treated with Crestor , Zetia  with history of CAD, PAD followed by cardiology. Cardiology appt in January. Now on REpatha  - appt next month. Labwork in May. Appt with Dr. Theodis Fiscal in June.   Lab Results  Component Value Date   CHOL 186 04/02/2023   HDL 92 04/02/2023   LDLCALC 80 04/02/2023   TRIG 79 04/02/2023   CHOLHDL 2.0 04/02/2023   Lab Results  Component Value Date   ALT 33 06/27/2023   AST 38 (H) 06/27/2023   ALKPHOS 48 06/27/2023   BILITOT 1.7 (H) 06/27/2023   Prediabetes: Trying to watch diet, lose weight. Down a few pounds today.  Lab Results  Component Value Date   HGBA1C 5.7 (H) 01/28/2023   Wt Readings from Last 3 Encounters:  07/10/23 192 lb 3.2 oz (87.2 kg)  06/24/23 194 lb (88 kg)  05/14/23 200 lb (90.7 kg)    Lab Results  Component Value Date   ALT 33  06/27/2023   AST 38 (H) 06/27/2023   ALKPHOS 48 06/27/2023   BILITOT 1.7 (H) 06/27/2023  Elevated bilirubin and AST in 4/10 labs.     History Patient Active Problem List   Diagnosis Date Noted   Hx of adenomatous colonic polyps 05/14/2023   Antiplatelet or antithrombotic long-term use 05/14/2023   Generalized obesity wtih starting BMI 34 09/05/2022   Pre-diabetes 04/25/2022   Pyrosis 03/30/2022   Bloating 03/30/2022   Abdominal pain, epigastric 03/30/2022   Polyphagia 03/22/2022   Eating disorder 12/27/2021   Osteoarthritis 09/28/2021   Degeneration of lumbosacral intervertebral disc 09/28/2021   Elevated ALT measurement 09/28/2021   Other fatigue 09/27/2021   Health care maintenance 09/27/2021   Vitamin D  deficiency 09/27/2021   Spondylosis without myelopathy 08/03/2021   Rheumatoid arthritis with rheumatoid factor (HCC) 08/03/2021   Psoriasis 08/03/2021   Hypopituitarism (HCC) 08/03/2021   Adrenal insufficiency (HCC) 08/03/2021   Gastroesophageal reflux disease 07/26/2021   Meningioma (HCC) 03/21/2021   Visual field defect 03/21/2021   Chronic left shoulder pain 03/21/2021   Cervical radiculopathy 03/21/2021   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   Pseudophakia of both eyes 06/29/2019   Steroid responders to glaucoma of both eyes 06/29/2019   S/P coronary artery stent placement    PAD (peripheral artery disease) (HCC)    CAD (coronary artery disease) 12/26/2018   Primary open-angle glaucoma 10/28/2018   Glaucoma associated with ocular inflammation  10/28/2018   Aortic atherosclerosis (HCC) 08/19/2018   Uveitis 05/05/2018   Goiter 05/05/2018   Mixed hyperlipidemia 05/05/2018   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   Family history of kidney cancer 01/15/2018   Family history of pancreatic cancer    Family history of breast cancer    Family history of uterine cancer    Family history of colon cancer    Family history of prostate cancer    Family history of lung cancer     Hypertensive retinopathy of both eyes 11/12/2017   Iritis of right eye 11/12/2017   Nuclear sclerotic cataract of left eye 11/12/2017   Ocular hypertension of right eye 11/12/2017   Inflammation of eye, right 08/07/2017   Memory loss 09/21/2016   Essential hypertension 09/21/2016   Low vitamin D  level 09/21/2016   Past Medical History:  Diagnosis Date   Allergic rhinitis    Allergy    Arthritis    Brain tumor (HCC)    Breast cyst 2007   Right   Cataract    surgery 07/2017   Clotting disorder (HCC)    On plavix  since stents  for artery blockages   Depression 2006   w/ menopause   Depression screening 09/27/2021   Family history of adverse reaction to anesthesia    sister PONV   Family history of breast cancer    Family history of colon cancer    Family history of kidney cancer 01/15/2018   Family history of lung cancer    Family history of pancreatic cancer    Family history of prostate cancer    Family history of uterine cancer    GERD (gastroesophageal reflux disease)    Glaucoma    Heart murmur    History of hysterectomy, supracervical    Hyperlipidemia    Hypertension    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   MLH1 [id number] (Splice site)   Multinodular goiter    PAD (peripheral artery disease) (HCC)    LE Art US  1/18: Occluded prox-mid R SFA // ABIs 9/22: R 0.83; L 1.0   Pustular psoriasis    Tobacco user    Past Surgical History:  Procedure Laterality Date   BIOPSY  01/15/2022   Procedure: BIOPSY;  Surgeon: Normie Becton., MD;  Location: [facility];  Service: Gastroenterology;;   BONE BIOPSY  06/24/2023   Procedure: BIOPSY;  Surgeon: Normie Becton., MD;  Location: [facility];  Service: Gastroenterology;;   BRAIN TUMOR EXCISION  02/24/2021   at [facility]   BREAST BIOPSY Bilateral 1985   CARDIAC CATHETERIZATION     CATARACT EXTRACTION Right 07/2017   COLONOSCOPY     COLONOSCOPY WITH PROPOFOL  N/A 01/15/2022   Procedure: COLONOSCOPY WITH  PROPOFOL ;  Surgeon: Normie Becton., MD;  Location: [facility];  Service: Gastroenterology;  Laterality: N/A;   COLONOSCOPY WITH PROPOFOL  N/A 06/24/2023   Procedure: COLONOSCOPY WITH PROPOFOL ;  Surgeon: Brice Campi Albino Alu., MD;  Location: [facility];  Service: Gastroenterology;  Laterality: N/A;   CORONARY STENT INTERVENTION N/A 12/26/2018   Procedure: CORONARY STENT INTERVENTION;  Surgeon: Arty Binning, MD;  Location: [facility];  Service: Cardiovascular;  Laterality: N/A;   ESOPHAGOGASTRODUODENOSCOPY (EGD) WITH PROPOFOL  N/A 01/15/2022   Procedure: ESOPHAGOGASTRODUODENOSCOPY (EGD) WITH PROPOFOL ;  Surgeon: Brice Campi Albino Alu., MD;  Location: [facility];  Service: Gastroenterology;  Laterality: N/A;   ESOPHAGOGASTRODUODENOSCOPY (EGD) WITH PROPOFOL  N/A 06/24/2023   Procedure: ESOPHAGOGASTRODUODENOSCOPY (EGD) WITH PROPOFOL ;  Surgeon: Brice Campi Albino Alu., MD;  Location: [facility];  Service: Gastroenterology;  Laterality: N/A;   EYE SURGERY  2019, 2020   Cataracts, eye shunt implants   LEFT HEART CATH AND CORONARY ANGIOGRAPHY N/A 12/17/2018   Procedure: LEFT HEART CATH AND CORONARY ANGIOGRAPHY;  Surgeon: Arty Binning, MD;  Location: [facility];  Service: Cardiovascular;  Laterality: N/A;   POLYPECTOMY  01/15/2022   Procedure: POLYPECTOMY;  Surgeon: Brice Campi Albino Alu., MD;  Location: [facility];  Service: Gastroenterology;;   POLYPECTOMY  06/24/2023   Procedure: POLYPECTOMY, INTESTINE;  Surgeon: Normie Becton., MD;  Location: [facility];  Service: Gastroenterology;;   SUPRACERVICAL ABDOMINAL HYSTERECTOMY  1986   UPPER GASTROINTESTINAL ENDOSCOPY     Allergies  Allergen Reactions   Sulfamethoxazole-Trimethoprim Other (See Comments) and Rash    Chills/achy Flu like symptoms    Conjugated Estrogens     Other reaction(s): rash   Wellbutrin [Bupropion Hcl] Rash    rash   Prior to Admission medications   Medication Sig Start Date End Date  Taking? Authorizing Provider  aspirin  81 MG EC tablet Take 81 mg by mouth every evening.    Yes [provider]  B Complex Vitamins (VITAMIN B COMPLEX) TABS Take 1 tablet by mouth daily.   Yes [provider]  brimonidine  (ALPHAGAN ) 0.2 % ophthalmic solution Place 1 drop into both eyes 3 (three) times daily.  11/20/18  Yes [provider]  clopidogrel  (PLAVIX ) 75 MG tablet Take 1 tablet (75 mg total) by mouth daily. 01/21/23 01/16/24 Yes Maudine Sos, MD  dorzolamide -timolol (COSOPT) 22.3-6.8 MG/ML ophthalmic solution Place 1 drop into the right eye 2 (two) times daily. 06/29/19  Yes [provider]  Evolocumab  (REPATHA  SURECLICK) 140 MG/ML SOAJ Inject 140 mg into the skin every 14 (fourteen) days. 05/31/23  Yes Maudine Sos, MD  ezetimibe  (ZETIA ) 10 MG tablet TAKE 1 TABLET BY MOUTH DAILY 10/17/22  Yes Maudine Sos, MD  famotidine (PEPCID) 20 MG tablet Take 20 mg by mouth 2 (two) times daily.   Yes [provider]  folic acid  (FOLVITE ) 1 MG tablet Take 1 mg by mouth daily.    Yes [provider]  hydrochlorothiazide  (MICROZIDE ) 12.5 MG capsule TAKE 1 CAPSULE(12.5 MG) BY MOUTH DAILY 03/06/23  Yes Maudine Sos, MD  leflunomide (ARAVA) 20 MG tablet Take 20 mg by mouth daily.   Yes [provider]  metoprolol  succinate (TOPROL -XL) 25 MG 24 hr tablet Take 1 tablet (25 mg total) by mouth daily. 01/21/23  Yes Maudine Sos, MD  minoxidil (LONITEN) 2.5 MG tablet Take 2.5 mg by mouth daily.   Yes [provider]  Multiple Vitamins-Minerals (MULTIVITAMIN WITH MINERALS) tablet Take 1 tablet by mouth daily.   Yes [provider]  Omega-3 Fatty Acids (FISH OIL) 1000 MG CAPS Take 1 capsule by mouth daily.    Yes [provider]  rosuvastatin  (CRESTOR ) 20 MG tablet Take 20 mg by mouth daily.   Yes [provider]  SIMPONI  ARIA 50 MG/4ML SOLN injection Inject 50 mg into the vein. Every 60 Days 07/16/19   Yes [provider]   Social History   Socioeconomic History   Marital status: Married    Spouse name: Cortland Ding   Number of children: 0   Years of education: Not on file   Highest education level: Master's degree (e.g., MA, MS, MEng, MEd, MSW, MBA)  Occupational History   Occupation: Special Ed Teacher    Comment: Dentist  Tobacco Use   Smoking status: Former    Current packs/day: 0.00  Average packs/day: 0.3 packs/day for 36.0 years (9.0 ttl pk-yrs)    Types: Cigarettes    Start date: 08/01/1981    Quit date: 08/01/2017    Years since quitting: 5.9   Smokeless tobacco: Never   Tobacco comments:    Started smoking at about 73 yrs old  Vaping Use   Vaping status: Never Used  Substance and Sexual Activity   Alcohol use: Not Currently   Drug use: No   Sexual activity: Not Currently    Partners: Male    Birth control/protection: Post-menopausal, Surgical    Comment: Hysterectomy  Other Topics Concern   Not on file  Social History Narrative   Not on file   Social Drivers of Health   Financial Resource Strain: Low Risk  (04/05/2023)   Overall Financial Resource Strain (CARDIA)    Difficulty of Paying Living Expenses: Not hard at all  Food Insecurity: No Food Insecurity (04/05/2023)   Hunger Vital Sign    Worried About Running Out of Food in the Last Year: Never true    Ran Out of Food in the Last Year: Never true  Transportation Needs: No Transportation Needs (04/05/2023)   PRAPARE - Administrator, Civil Service (Medical): No    Lack of Transportation (Non-Medical): No  Physical Activity: Sufficiently Active (04/05/2023)   Exercise Vital Sign    Days of Exercise per Week: 5 days    Minutes of Exercise per Session: 30 min  Stress: No Stress Concern Present (04/05/2023)   [employer] of Occupational Health - Occupational Stress Questionnaire    Feeling of Stress : Not at all  Social Connections: Moderately Integrated (04/05/2023)   Social  Connection and Isolation Panel [NHANES]    Frequency of Communication with Friends and Family: More than three times a week    Frequency of Social Gatherings with Friends and Family: Once a week    Attends Religious Services: Never    Database administrator or Organizations: No    Attends Engineer, structural: More than 4 times per year    Marital Status: Married  Catering manager Violence: Not At Risk (08/16/2022)   Humiliation, Afraid, Rape, and Kick questionnaire    Fear of Current or Ex-Partner: No    Emotionally Abused: No    Physically Abused: No    Sexually Abused: No    Review of Systems  Constitutional:  Negative for fatigue and unexpected weight change.  Respiratory:  Negative for chest tightness and shortness of breath.   Cardiovascular:  Negative for chest pain, palpitations and leg swelling.  Gastrointestinal:  Negative for abdominal pain and blood in stool.  Neurological:  Negative for dizziness, syncope, light-headedness and headaches.     Objective:   Vitals:   07/10/23 1337  BP: 116/68  Pulse: 70  Temp: 98 F (36.7 C)  TempSrc: Temporal  SpO2: 97%  Weight: 192 lb 3.2 oz (87.2 kg)  Height: 5\' 7"  (1.702 m)     Physical Exam Vitals reviewed.  Constitutional:      Appearance: Normal appearance. She is well-developed.  HENT:     Head: Normocephalic and atraumatic.  Eyes:     Conjunctiva/sclera: Conjunctivae normal.     Pupils: Pupils are equal, round, and reactive to light.  Neck:     Vascular: No carotid bruit.  Cardiovascular:     Rate and Rhythm: Normal rate and regular rhythm.     Heart sounds: Normal heart sounds.  Pulmonary:  Effort: Pulmonary effort is normal.     Breath sounds: Normal breath sounds.  Abdominal:     Palpations: Abdomen is soft. There is no pulsatile mass.     Tenderness: There is no abdominal tenderness.  Musculoskeletal:     Right lower leg: No edema.     Left lower leg: No edema.  Skin:    General: Skin is  warm and dry.  Neurological:     Mental Status: She is alert and oriented to person, place, and time.  Psychiatric:        Mood and Affect: Mood normal.        Behavior: Behavior normal.     Assessment & Plan:  Pamela Lowe is a 73 y.o. female . Essential hypertension - Plan: Comprehensive metabolic panel with GFR  - Stable on current regimen.  Check labs above, no changes for now, continue follow-up with cardiology as planned.  Pre-diabetes - Plan: Hemoglobin A1c  - Check A1c, commended on weight loss.  No new meds for now.  Mixed hyperlipidemia  - Now on Repatha , follow-up with cardiology as planned for repeat lipids and management.  LFT elevation - Plan: Comprehensive metabolic panel with GFR  - Elevated bilirubin not noted on prior labs, history of elevated LFTs, most recent labs with slight elevated LFT as above, repeat labs.  Now with new meds from rheumatology, initial diarrhea which has improved.  No orders of the defined types were placed in this encounter.  Patient Instructions  Thanks for coming in today.  I will repeat the liver test, and look at the 59-month blood sugar test today.  Keep follow-up with cardiology regarding your cholesterol medicine.  Follow-up with me in 6 months, let me know if there are any questions in the meantime and take care.    Signed,   Caro Christmas, MD [facility], [facility] 07/10/23 2:18 PM

## 2023-07-10 NOTE — Patient Instructions (Signed)
 Thanks for coming in today.  I will repeat the liver test, and look at the 63-month blood sugar test today.  Keep follow-up with cardiology regarding your cholesterol medicine.  Follow-up with me in 6 months, let me know if there are any questions in the meantime and take care.

## 2023-07-16 ENCOUNTER — Encounter: Payer: Self-pay | Admitting: Family Medicine

## 2023-07-27 IMAGING — MR MR KNEE*R* W/O CM
4 of 7 series · 23 of 40 positions shown · non-contrast
Comparison: None.

CLINICAL DATA: Right knee pain and weakness for 2 weeks

EXAM:
MRI OF THE RIGHT KNEE WITHOUT CONTRAST
TECHNIQUE: Multiplanar, multisequence MR imaging of the knee was performed. No
intravenous contrast was administered.

[Series 4: T1 · coronal · 4.0mm · 0.31mm/px · 3 of 29 slices shown]
[im 6/29]
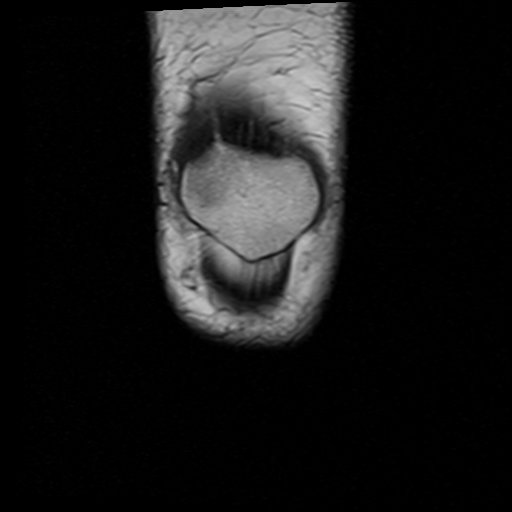
[im 17/29]
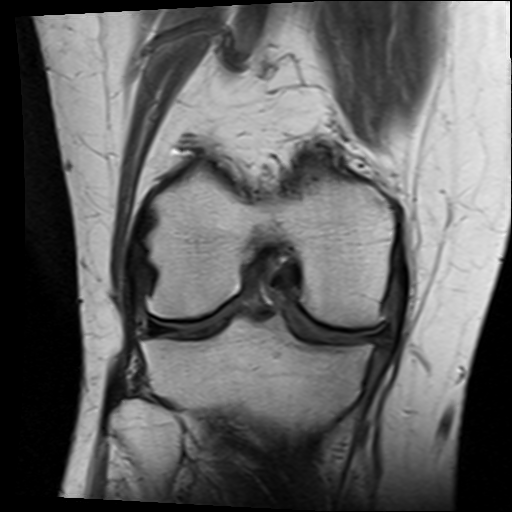
[im 29/29]
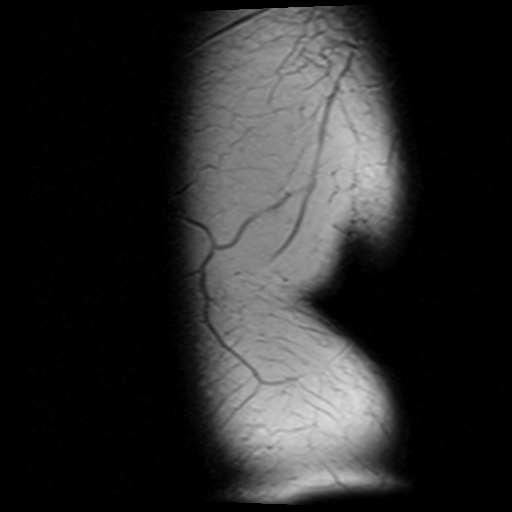

[Series 5: T2 fat-sat · coronal · 4.0mm · 0.62mm/px · 6 of 29 slices shown (1 of 2)]
[im 1/29]
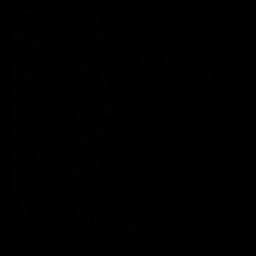
[im 6/29]
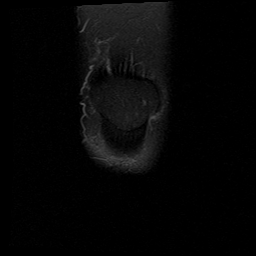
[im 12/29]
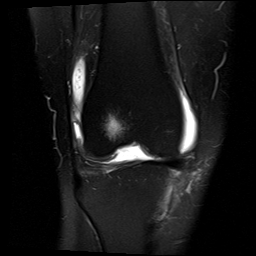
[im 17/29]
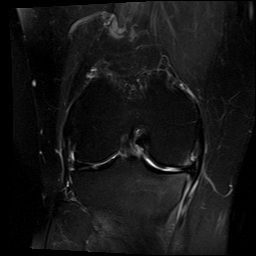
[im 23/29]
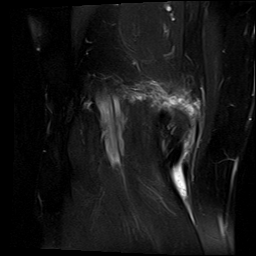
[im 29/29]
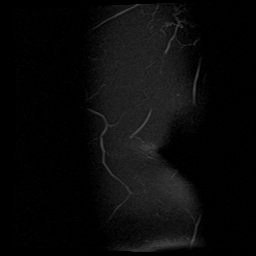

[Series 7: PD fat-sat · sagittal · 3.0mm · 0.31mm/px · 7 of 32 slices shown]
[im 1/32]
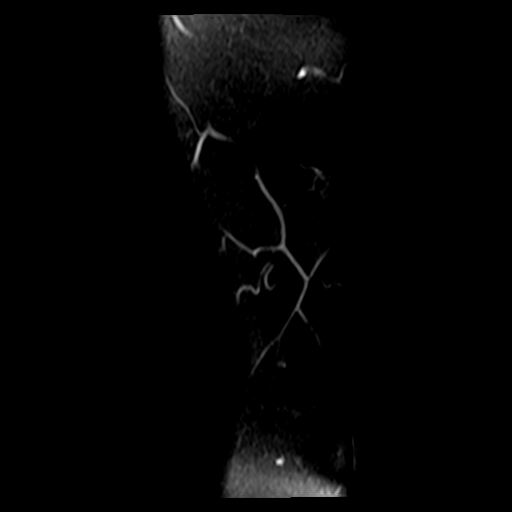
[im 6/32]
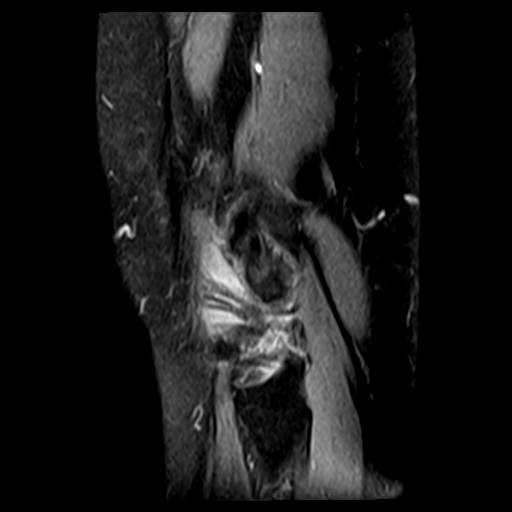
[im 11/32]
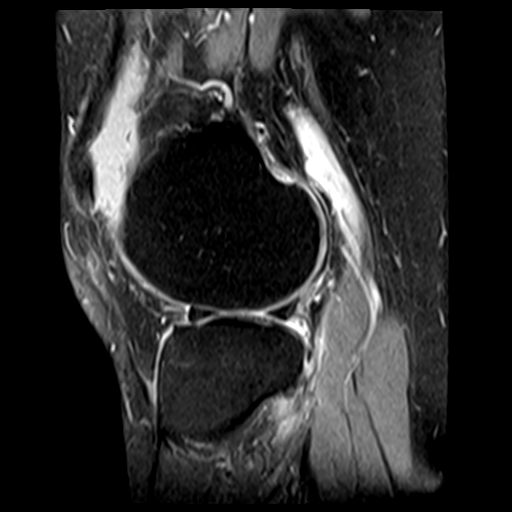
[im 16/32]
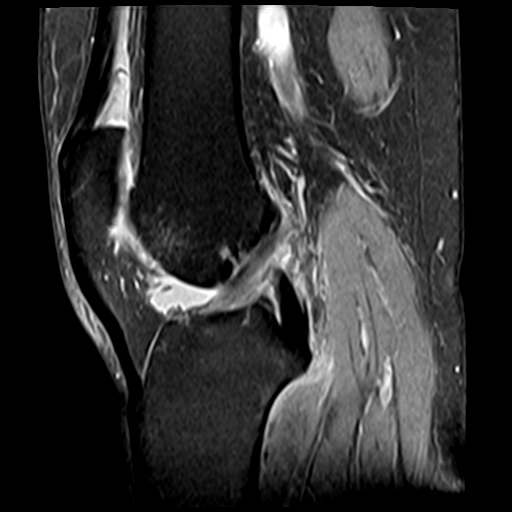
[im 21/32]
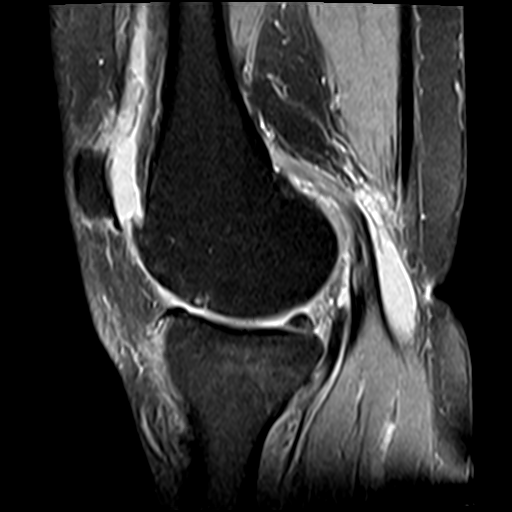
[im 26/32]
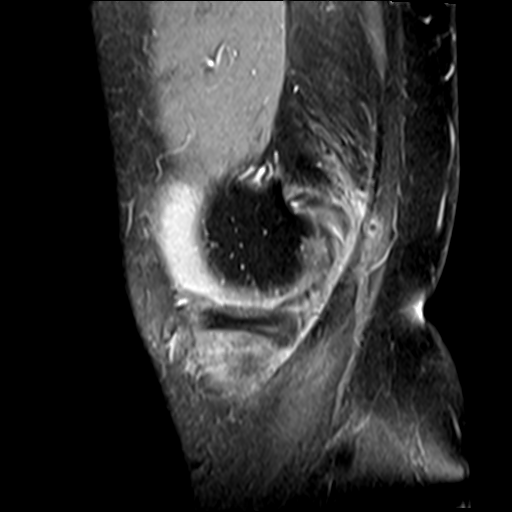
[im 32/32]
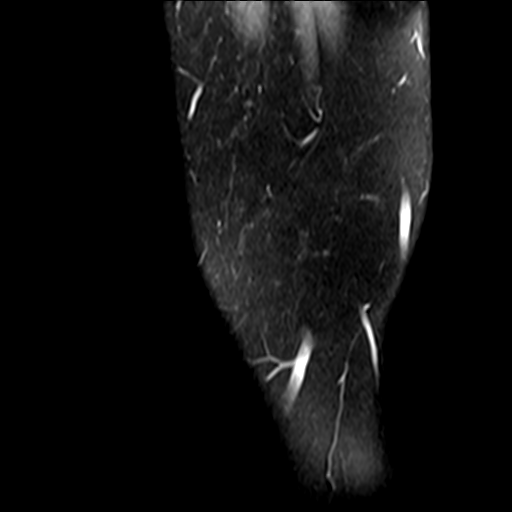

[Series 8: T2 fat-sat · sagittal · 3.0mm · 0.31mm/px · 7 of 32 slices shown (2 of 2)]
[im 1/32]
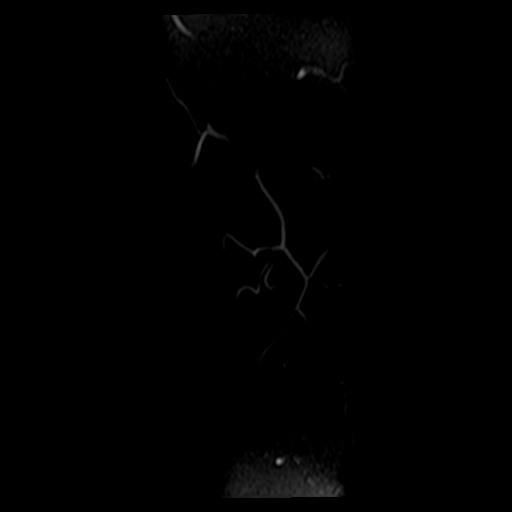
[im 6/32]
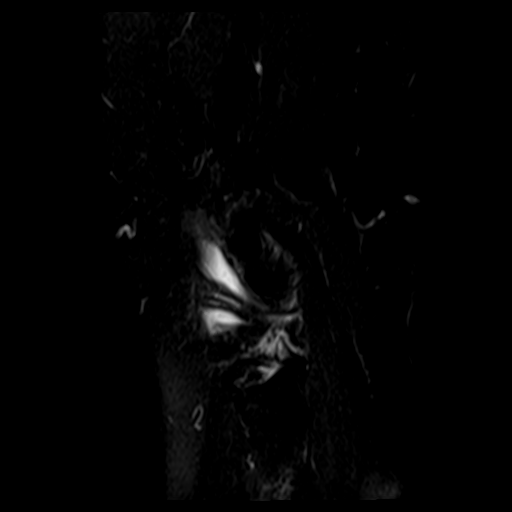
[im 11/32]
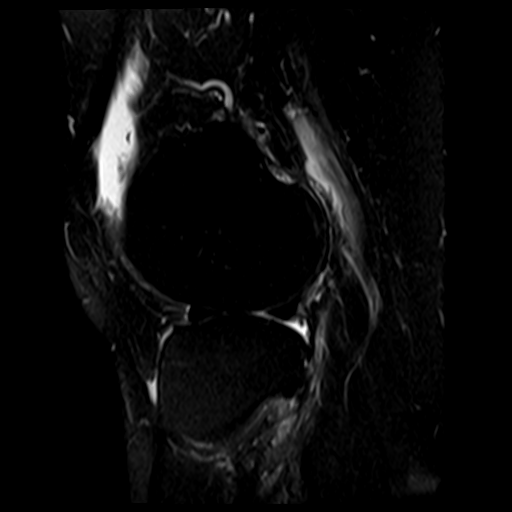
[im 16/32]
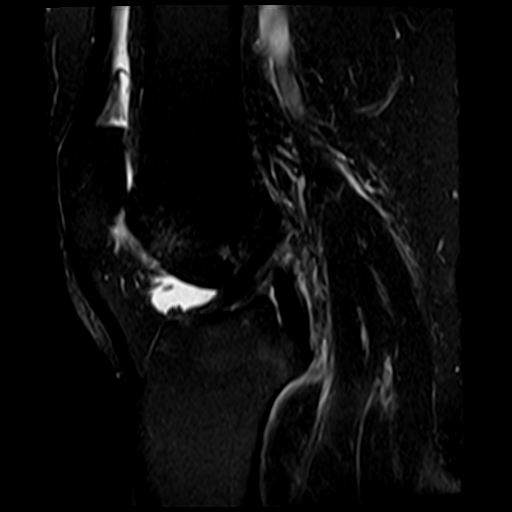
[im 21/32]
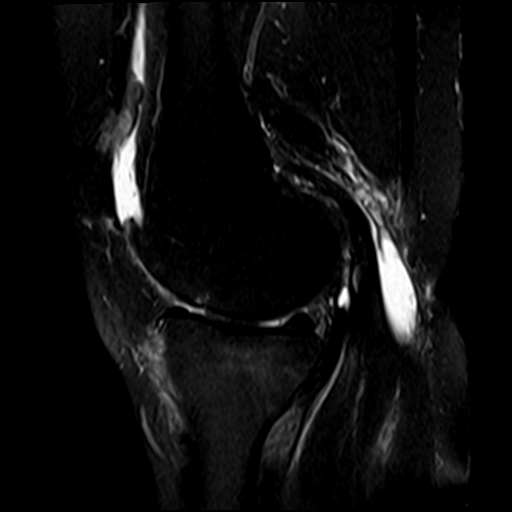
[im 26/32]
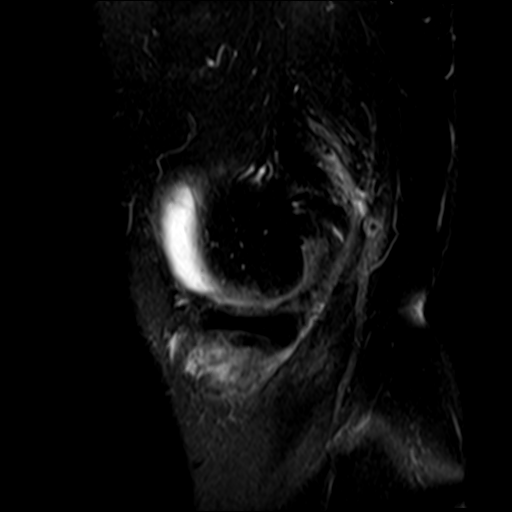
[im 32/32]
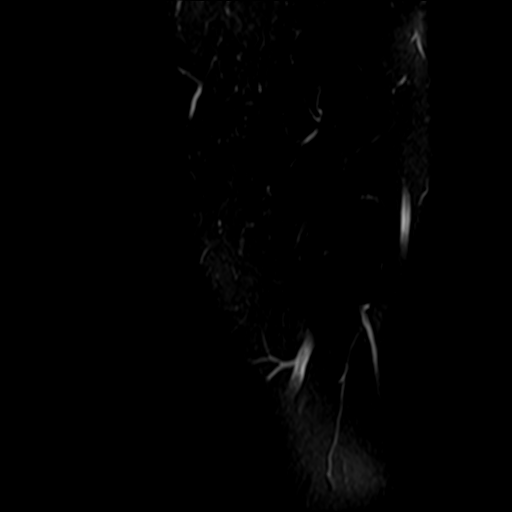

[23 of 40 positions shown; findings below may reference images not displayed]

FINDINGS: MENISCI

Medial: Radial tear of the posterior horn of the medial meniscus at
the meniscal root with peripheral meniscal extrusion. Degeneration
of the medial meniscus.

Lateral: Intact.

LIGAMENTS

Cruciates: ACL and PCL are intact.

Collaterals: Medial collateral ligament is intact. Lateral
collateral ligament complex is intact.

CARTILAGE

Patellofemoral: Partial-thickness cartilage loss of the medial
patellar facet and patellar apex with areas of full-thickness
cartilage loss. Full-thickness cartilage loss of the medial
trochlea. Focal area of high-grade partial-thickness cartilage loss
of the lateral trochlea with subchondral reactive marrow edema.

Medial: High-grade partial-thickness cartilage loss of the medial
femorotibial compartment with subchondral reactive marrow edema.

Lateral:  Cartilage fissuring of the lateral tibial plateau.

JOINT: Large joint effusion. Normal Maleek Constantine. No plical
thickening.

POPLITEAL FOSSA: Popliteus tendon is intact. Small Baker's cyst.

EXTENSOR MECHANISM: Intact quadriceps tendon. Intact patellar
tendon. Intact lateral patellar retinaculum. Intact medial patellar
retinaculum. Intact MPFL.

BONES: No aggressive osseous lesion. No fracture or dislocation.

Other: No fluid collection or hematoma. Muscles are normal.
IMPRESSION: 1. Radial tear of the posterior horn of the medial meniscus at the
meniscal root with peripheral meniscal extrusion. Degeneration of
the medial meniscus.
2. Tricompartmental cartilage abnormalities as described above.

## 2023-07-31 ENCOUNTER — Encounter: Payer: Self-pay | Admitting: Gynecologic Oncology

## 2023-07-31 NOTE — Progress Notes (Unsigned)
 GYNECOLOGIC ONCOLOGY NEW PATIENT CONSULTATION   Patient Name: Pamela Lowe  Patient Age: 73 y.o. Date of Service: 08/02/23 Referring Provider: Amiel Balder MD  Primary Care Provider: Benjiman Bras, MD Consulting Provider: Wiley Hanger, MD   Assessment/Plan:  Postmenopausal patient with remote history of supracervical hysterectomy with more recent germline genetic testing showing Lynch syndrome.  We discussed Lynch syndrome and the increased risk of certain cancers.  We also specifically reviewed the risks related to MLH1 mutations.  The risk of endometrial cancer is 35-53% and for ovarian cancer is 4-20% for an MLH1 mutation. NCCN recommends consideration of risk reducing BSO, with timing individualized to each patient. For MLH1 mutations, given increased risk of endometrial cancer and ovarian cancer, hysterectomy with BSO can be considered starting at age 22. Routine screening for ovarian cancer is not recommended but CA-125 and ultrasound are recommended for preoperative planning.    The patient recently had both pelvic ultrasound and Ca1 25 which were normal.  We discussed that given her age of 73, risk of ovarian cancer is likely overestimated in the numbers mentioned above.  Reviewed her prior supracervical hysterectomy history.  We discussed the theoretical risk that if some of her lower uterine segment was left in situ at the time of this surgery, she could be at increased risk of cancer.  We reviewed the risks and benefits of concurrent trachelectomy at the time of risk reducing BSO.  Specifically, we discussed the risk of injury to bladder or rectum if there is adhesive disease from her prior surgery.  Ultimately, after our discussion, she is accepting of trachelectomy if feasible without significant risk of injuring surrounding organs.  If there does appear to be significant adhesive disease or risk of injury to the colon, would likely plan to leave the cervix in situ.  Given  medical comorbidities, my office will reach out to her rheumatologist as well as her cardiologist.  In addition to clearance for surgery, we will ask for recommendations with regard to her golimumab , loniten, and Plavix .  We will reach out to her ophthalmologist regarding clearance in the setting of her glaucoma.  We reviewed the plan for robotic assisted bilateral salpingo-oophorectomy, possible trachelectomy, possible laparotomy, and any other indicated procedures. The risks of surgery were discussed in detail and she understands these to include infection; wound separation; hernia; vaginal cuff separation, injury to adjacent organs such as bowel, bladder, blood vessels, ureters and nerves; bleeding which may require blood transfusion; anesthesia risk; thromboembolic events; possible death; unforeseen complications; possible need for re-exploration; medical complications such as heart attack, stroke, pleural effusion and pneumonia. The patient will receive DVT and antibiotic prophylaxis as indicated. She voiced a clear understanding. She had the opportunity to ask questions. Perioperative instructions were reviewed with her. Prescriptions for post-op medications were sent to her pharmacy of choice.  A copy of this note was sent to the patient's referring provider.   72 minutes of total time was spent for this patient encounter, including preparation, face-to-face counseling with the patient and coordination of care, and documentation of the encounter.  Wiley Hanger, MD  Division of Gynecologic Oncology  Department of Obstetrics and Gynecology  [facility]  ___________________________________________  Chief Complaint: Chief Complaint  Patient presents with   Lynch Syndrome    History of Present Illness:  Pamela Lowe is a 73 y.o. y.o. female who is seen in consultation at the request of Dr. Amiel Balder for an evaluation of Lynch syndrome,  discussion regarding  risk-reducing surgery.   The patient has a strong family history of multiple tumors, most notably breast cancer. [facility] germline testing in 01/2018 showed likely pathogenic mutation in MLH1 [phone]).  There have been discussions about risk reducing surgery for her within the past 5 years but secondary to medical comorbidities, surgery was deferred.  Pelvic ultrasound on 04/03/23 showed surgically absent uterus, normal cervix. Normal appearing bilateral adnexa, no adnexal masses. No free fluid.  CA-125 was 7.1.  Today, the patient presents by herself.  She notes overall doing well.  She had a colonoscopy about a month and a half ago and has had 2 episodes of blood in her stool, which she describes as looking like old blood with some clots.  This happened most recently a couple of days ago.  She plans to reach out to her gastroenterologist.  She denies any vaginal bleeding or discharge.  Denies any pelvic pain.  Reports baseline bowel and bladder function.  Her medical history is notable for uveitic glaucoma of both eyes, coronary artery disease (s/p stent placement on plavix  and baby aspirin ), and a meningioma (resected in 2022).  She denies any significant shortness of breath or chest pain.  Methotrexate was recently transitioned to Leflunomide.  PAST MEDICAL HISTORY:  Past Medical History:  Diagnosis Date   Allergic rhinitis    Allergy    Arthritis    Brain tumor (HCC)    Breast cyst 2007   Right   Cataract    surgery 07/2017   Clotting disorder (HCC)    On plavix  since stents  for artery blockages   Depression 2006   w/ menopause   Depression screening 09/27/2021   Family history of adverse reaction to anesthesia    sister PONV   Family history of breast cancer    Family history of colon cancer    Family history of kidney cancer 01/15/2018   Family history of lung cancer    Family history of pancreatic cancer    Family history of prostate cancer    Family  history of uterine cancer    GERD (gastroesophageal reflux disease)    Glaucoma    Heart murmur    History of hysterectomy, supracervical    Hyperlipidemia    Hypertension    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   MLH1 [id number] (Splice site)   Multinodular goiter    PAD (peripheral artery disease) (HCC)    LE Art US  1/18: Occluded prox-mid R SFA // ABIs 9/22: R 0.83; L 1.0   Pustular psoriasis    Tobacco user      PAST SURGICAL HISTORY:  Past Surgical History:  Procedure Laterality Date   BIOPSY  01/15/2022   Procedure: BIOPSY;  Surgeon: Normie Becton., MD;  Location: [facility];  Service: Gastroenterology;;   BONE BIOPSY  06/24/2023   Procedure: BIOPSY;  Surgeon: Normie Becton., MD;  Location: [facility];  Service: Gastroenterology;;   BRAIN TUMOR EXCISION  02/24/2021   at [facility]   BREAST BIOPSY Bilateral 1985   CARDIAC CATHETERIZATION     CATARACT EXTRACTION Right 07/2017   COLONOSCOPY     COLONOSCOPY WITH PROPOFOL  N/A 01/15/2022   Procedure: COLONOSCOPY WITH PROPOFOL ;  Surgeon: Normie Becton., MD;  Location: [facility];  Service: Gastroenterology;  Laterality: N/A;   COLONOSCOPY WITH PROPOFOL  N/A 06/24/2023   Procedure: COLONOSCOPY WITH PROPOFOL ;  Surgeon: Mansouraty, Albino Alu., MD;  Location: [facility];  Service: Gastroenterology;  Laterality: N/A;   CORONARY STENT INTERVENTION N/A  12/26/2018   Procedure: CORONARY STENT INTERVENTION;  Surgeon: Arty Binning, MD;  Location: [facility];  Service: Cardiovascular;  Laterality: N/A;   ESOPHAGOGASTRODUODENOSCOPY (EGD) WITH PROPOFOL  N/A 01/15/2022   Procedure: ESOPHAGOGASTRODUODENOSCOPY (EGD) WITH PROPOFOL ;  Surgeon: Brice Campi Albino Alu., MD;  Location: [facility];  Service: Gastroenterology;  Laterality: N/A;   ESOPHAGOGASTRODUODENOSCOPY (EGD) WITH PROPOFOL  N/A 06/24/2023   Procedure: ESOPHAGOGASTRODUODENOSCOPY (EGD) WITH PROPOFOL ;  Surgeon: Brice Campi Albino Alu., MD;   Location: [facility];  Service: Gastroenterology;  Laterality: N/A;   EYE SURGERY  2019, 2020   Cataracts, eye shunt implants   LEFT HEART CATH AND CORONARY ANGIOGRAPHY N/A 12/17/2018   Procedure: LEFT HEART CATH AND CORONARY ANGIOGRAPHY;  Surgeon: Arty Binning, MD;  Location: [facility]AS[facility] CV LAB;  Service: Cardiovascular;  Laterality: N/A;   POLYPECTOMY  01/15/2022   Procedure: POLYPECTOMY;  Surgeon: Brice Campi Albino Alu., MD;  Location: [facility];  Service: Gastroenterology;;   POLYPECTOMY  06/24/2023   Procedure: POLYPECTOMY, INTESTINE;  Surgeon: Normie Becton., MD;  Location: [facility];  Service: Gastroenterology;;   SUPRACERVICAL ABDOMINAL HYSTERECTOMY  1986   UPPER GASTROINTESTINAL ENDOSCOPY      OB/GYN HISTORY:  OB History  Gravida Para Term Preterm AB Living  1 0   1 0  SAB IAB Ectopic Multiple Live Births  1        # Outcome Date GA Lbr Len/2nd Weight Sex Type Anes PTL Lv  1 SAB             Patient's last menstrual period was 03/19/1998.  Age at menarche: [age]  Age at menopause: unsure, some time after her hysterectomy Hx of HRT: yes Hx of STDs: denies Last pap: 2022 - NIML, HR HPV negative History of abnormal pap smears: denies  SCREENING STUDIES:  Last mammogram: 2025  Last colonoscopy: 2025  MEDICATIONS: Outpatient Encounter Medications as of 08/02/2023  Medication Sig   aspirin  81 MG EC tablet Take 81 mg by mouth every evening.    B Complex Vitamins (VITAMIN B COMPLEX) TABS Take 1 tablet by mouth daily.   betamethasone , augmented, (DIPROLENE ) 0.05 % lotion Apply topically.   brimonidine  (ALPHAGAN ) 0.2 % ophthalmic solution Place 1 drop into both eyes 3 (three) times daily.    Calcium -Vitamin D -Vitamin K (CALCIUM  + D PO)    Cholecalciferol (VITAMIN D ) 50 MCG (2000 UT) tablet Take 2,000 Units by mouth daily.   clopidogrel  (PLAVIX ) 75 MG tablet Take 1 tablet (75 mg total) by mouth daily.   dorzolamide -timolol (COSOPT) 22.3-6.8 MG/ML  ophthalmic solution Place 1 drop into the right eye 2 (two) times daily.   Evolocumab  (REPATHA  SURECLICK) 140 MG/ML SOAJ Inject 140 mg into the skin every 14 (fourteen) days.   ezetimibe  (ZETIA ) 10 MG tablet TAKE 1 TABLET BY MOUTH DAILY   famotidine (PEPCID) 20 MG tablet Take 20 mg by mouth 2 (two) times daily.   folic acid  (FOLVITE ) 1 MG tablet Take 1 mg by mouth daily.    hydrochlorothiazide  (MICROZIDE ) 12.5 MG capsule TAKE 1 CAPSULE(12.5 MG) BY MOUTH DAILY   leflunomide (ARAVA) 20 MG tablet Take 20 mg by mouth daily.   metoprolol  succinate (TOPROL -XL) 25 MG 24 hr tablet Take 1 tablet (25 mg total) by mouth daily.   minoxidil (LONITEN) 2.5 MG tablet Take 2.5 mg by mouth daily.   Multiple Vitamins-Minerals (MULTIVITAMIN WITH MINERALS) tablet Take 1 tablet by mouth daily.   Nutritional Supplements (VITAL HIGH PROTEIN PO) Take by mouth.   Omega-3 Fatty Acids (FISH  OIL) 1000 MG CAPS Take 1 capsule by mouth daily.    rosuvastatin  (CRESTOR ) 20 MG tablet Take 20 mg by mouth daily.   SIMPONI  ARIA 50 MG/4ML SOLN injection Inject 50 mg into the vein. Every 60 Days   VYZULTA 0.024 % SOLN Apply to eye. Right eye   No facility-administered encounter medications on file as of 08/02/2023.    ALLERGIES:  Allergies  Allergen Reactions   Sulfamethoxazole-Trimethoprim Other (See Comments) and Rash    Chills/achy Flu like symptoms    Conjugated Estrogens     Other reaction(s): rash   Wellbutrin [Bupropion Hcl] Rash    rash     FAMILY HISTORY:  Family History  Problem Relation Age of Onset   Hypertension Mother    Deep vein thrombosis Mother    Alzheimer's disease Mother    Osteopenia Mother    Arthritis Mother    Varicose Veins Mother    Kidney cancer Father 62   Pneumonia Father    Hypertension Father    Arthritis Father    Early death Father    Kidney disease Father    Breast cancer Sister 41       recurrence 77   Cancer Sister    Breast cancer Sister 17       Stage 0   Cancer  Sister    Breast cancer Sister 61       Stage 3, bilateral mastectomy   Cancer Sister    Early death Sister    Deep vein thrombosis Sister            Allergies Brother    Early death Brother    Heart attack Brother 19   Breast cancer Maternal Aunt 67   Prostate cancer Maternal Uncle 68   Lung cancer Maternal Uncle 60   Pancreatic cancer Paternal Aunt 1   Pancreatic cancer Paternal Aunt 66   Brain cancer Paternal Aunt    Pancreatic cancer Paternal Uncle 90   Lung cancer Paternal Uncle 39   Heart attack Maternal Grandfather    Uterine cancer Paternal Grandmother 58   Colon cancer Paternal Grandfather 71   Kidney cancer Cousin 79   Kidney cancer Cousin 52   Uterine cancer Cousin 62   Prostate cancer Cousin 57   Breast cancer Cousin 60   Thyroid  disease Neg Hx    Rectal cancer Neg Hx    Stomach cancer Neg Hx    Esophageal cancer Neg Hx    Inflammatory bowel disease Neg Hx    Liver disease Neg Hx      SOCIAL HISTORY:  Social Connections: Moderately Integrated (04/05/2023)   Social Connection and Isolation Panel [NHANES]    Frequency of Communication with Friends and Family: More than three times a week    Frequency of Social Gatherings with Friends and Family: Once a week    Attends Religious Services: Never    Database administrator or Organizations: No    Attends Engineer, structural: More than 4 times per year    Marital Status: Married    REVIEW OF SYSTEMS:  + vision problems, easy bruising/bleeding Denies appetite changes, fevers, chills, fatigue, unexplained weight changes. Denies hearing loss, neck lumps or masses, mouth sores, ringing in ears or voice changes. Denies cough or wheezing.  Denies shortness of breath. Denies chest pain or palpitations. Denies leg swelling. Denies abdominal distention, pain, blood in stools, constipation, diarrhea, nausea, vomiting, or early satiety. Denies pain with intercourse, dysuria, frequency, hematuria  or  incontinence. Denies hot flashes, pelvic pain, vaginal bleeding or vaginal discharge.   Denies joint pain, back pain or muscle pain/cramps. Denies itching, rash, or wounds. Denies dizziness, headaches, numbness or seizures. Denies swollen lymph nodes or glands. Denies anxiety, depression, confusion, or decreased concentration.  Physical Exam:  Vital Signs for this encounter:  Blood pressure 114/63, pulse 70, temperature 98.3 F (36.8 C), temperature source Oral, resp. rate 18, height 5' 7.32" (1.71 m), weight 194 lb 9.6 oz (88.3 kg), last menstrual period 03/19/1998, SpO2 98%. Body mass index is 30.19 kg/m. General: Alert, oriented, no acute distress.  HEENT: Normocephalic, atraumatic. Sclera anicteric.  Chest: Clear to auscultation bilaterally. No wheezes, rhonchi, or rales. Cardiovascular: Regular rate and rhythm, no murmurs, rubs, or gallops.  Abdomen: Normoactive bowel sounds. Soft, nondistended, nontender to palpation. No masses or hepatosplenomegaly appreciated. No palpable fluid wave.  Extremities: Grossly normal range of motion. Warm, well perfused. No edema bilaterally.  Skin: No rashes or lesions.  Lymphatics: No cervical, supraclavicular, or inguinal adenopathy.  GU:  Normal external female genitalia. No lesions. No discharge or bleeding.             Bladder/urethra:  No lesions or masses, well supported bladder             Vagina: Mildly atrophic vaginal mucosa.             Cervix: Normal appearing, no lesions.             Uterus: Ridgely absent.             Adnexa: No masses.  Rectal: Deferred.  LABORATORY AND RADIOLOGIC DATA:  Outside medical records were reviewed to synthesize the above history, along with the history and physical obtained during the visit.   Lab Results  Component Value Date   WBC 9.8 03/27/2022   HGB 14.2 03/27/2022   HCT 43.2 03/27/2022   PLT 319.0 03/27/2022   GLUCOSE 110 (H) 07/10/2023   CHOL 186 04/02/2023   TRIG 79 04/02/2023   HDL 92  04/02/2023   LDLCALC 80 04/02/2023   ALT 28 07/10/2023   AST 36 07/10/2023   NA 140 07/10/2023   K 3.5 07/10/2023   CL 101 07/10/2023   CREATININE 1.16 07/10/2023   BUN 22 07/10/2023   CO2 33 (H) 07/10/2023   TSH 0.823 09/27/2021   INR 0.9 12/18/2021   HGBA1C 5.4 07/10/2023

## 2023-08-02 ENCOUNTER — Telehealth: Payer: Self-pay

## 2023-08-02 ENCOUNTER — Inpatient Hospital Stay (HOSPITAL_BASED_OUTPATIENT_CLINIC_OR_DEPARTMENT_OTHER): Admitting: Gynecologic Oncology

## 2023-08-02 ENCOUNTER — Encounter: Payer: Self-pay | Admitting: Gynecologic Oncology

## 2023-08-02 ENCOUNTER — Inpatient Hospital Stay: Attending: Gynecologic Oncology | Admitting: Gynecologic Oncology

## 2023-08-02 VITALS — BP 114/63 | HR 70 | Temp 98.3°F | Resp 18 | Ht 67.32 in | Wt 194.6 lb

## 2023-08-02 DIAGNOSIS — R011 Cardiac murmur, unspecified: Secondary | ICD-10-CM | POA: Diagnosis not present

## 2023-08-02 DIAGNOSIS — Z801 Family history of malignant neoplasm of trachea, bronchus and lung: Secondary | ICD-10-CM | POA: Diagnosis not present

## 2023-08-02 DIAGNOSIS — Z7969 Long term (current) use of other immunomodulators and immunosuppressants: Secondary | ICD-10-CM | POA: Diagnosis not present

## 2023-08-02 DIAGNOSIS — Z7982 Long term (current) use of aspirin: Secondary | ICD-10-CM | POA: Insufficient documentation

## 2023-08-02 DIAGNOSIS — Z148 Genetic carrier of other disease: Secondary | ICD-10-CM | POA: Diagnosis not present

## 2023-08-02 DIAGNOSIS — Z955 Presence of coronary angioplasty implant and graft: Secondary | ICD-10-CM | POA: Insufficient documentation

## 2023-08-02 DIAGNOSIS — I2510 Atherosclerotic heart disease of native coronary artery without angina pectoris: Secondary | ICD-10-CM | POA: Diagnosis not present

## 2023-08-02 DIAGNOSIS — Z803 Family history of malignant neoplasm of breast: Secondary | ICD-10-CM | POA: Insufficient documentation

## 2023-08-02 DIAGNOSIS — Z1589 Genetic susceptibility to other disease: Secondary | ICD-10-CM

## 2023-08-02 DIAGNOSIS — Z8051 Family history of malignant neoplasm of kidney: Secondary | ICD-10-CM | POA: Diagnosis not present

## 2023-08-02 DIAGNOSIS — Z7902 Long term (current) use of antithrombotics/antiplatelets: Secondary | ICD-10-CM | POA: Insufficient documentation

## 2023-08-02 DIAGNOSIS — H409 Unspecified glaucoma: Secondary | ICD-10-CM | POA: Diagnosis not present

## 2023-08-02 DIAGNOSIS — Z1509 Genetic susceptibility to other malignant neoplasm: Secondary | ICD-10-CM

## 2023-08-02 DIAGNOSIS — Z8049 Family history of malignant neoplasm of other genital organs: Secondary | ICD-10-CM | POA: Diagnosis not present

## 2023-08-02 DIAGNOSIS — D329 Benign neoplasm of meninges, unspecified: Secondary | ICD-10-CM

## 2023-08-02 DIAGNOSIS — M1990 Unspecified osteoarthritis, unspecified site: Secondary | ICD-10-CM | POA: Insufficient documentation

## 2023-08-02 DIAGNOSIS — Z90711 Acquired absence of uterus with remaining cervical stump: Secondary | ICD-10-CM | POA: Insufficient documentation

## 2023-08-02 DIAGNOSIS — Z800 Family history of malignant neoplasm of digestive organs: Secondary | ICD-10-CM | POA: Insufficient documentation

## 2023-08-02 DIAGNOSIS — Z79899 Other long term (current) drug therapy: Secondary | ICD-10-CM | POA: Insufficient documentation

## 2023-08-02 DIAGNOSIS — Z808 Family history of malignant neoplasm of other organs or systems: Secondary | ICD-10-CM | POA: Diagnosis not present

## 2023-08-02 NOTE — Telephone Encounter (Signed)
   Pre-operative Risk Assessment    Patient Name: JAQUAYA HELDMAN  DOB: 03-31-1950 MRN: [id number]   Date of last office visit: 01/16/23 with Dr. Theodis Fiscal Date of next office visit: 08/29/23 with Dr. Theodis Fiscal   Request for Surgical Clearance    Procedure:  Robotic assisted bilateral salpingo-oophorectomy trachelectomy, possible laparotomy, and any other indicated procedures.   Date of Surgery:  Clearance 09/05/23                                Surgeon:  Dr. Wiley Hanger  Surgeon's Group or Practice Name:  [facility] Phone number:  [phone] Fax number:  [phone]   Type of Clearance Requested:   - Medical  - Pharmacy:  Hold Aspirin  and Clopidogrel  (Plavix ) recommendations pre and post op.    Type of Anesthesia:  General    Additional requests/questions:    Signed, Sudie Ely   08/02/2023, 1:39 PM

## 2023-08-02 NOTE — Telephone Encounter (Signed)
   Name: Pamela Lowe  DOB: 1951/03/02  MRN: [id number]  Primary Cardiologist: Mickiel Albany, MD (Inactive)  Chart reviewed as part of pre-operative protocol coverage. The patient has an upcoming visit scheduled with Dr. Theodis Fiscal on 08/29/2023 at which time clearance can be addressed in case there are any issues that would impact surgical recommendations.  Robotic assisted bilateral salpingo-oophorectomy trachelectomy, possible laparotomy, and any other indicated procedures is not scheduled until 09/05/2023 as below. I added preop FYI to appointment note so that provider is aware to address at time of outpatient visit.  Per office protocol the cardiology provider should forward their finalized clearance decision and recommendations regarding antiplatelet therapy to the requesting party below.    Patient's primary cardiologist Dr. Theodis Fiscal will give instructions on Aspirin  and Plavix  held at time of office visit.  I will route this message as FYI to requesting party and remove this message from the preop box as separate preop APP input not needed at this time.   Please call with any questions.  Ava Boatman, NP  08/02/2023, 4:46 PM

## 2023-08-02 NOTE — Patient Instructions (Signed)
 Preparing for your Surgery   Plan for surgery on September 05, 2023 with Dr. Wiley Hanger at [facility]. You will be scheduled for robotic assisted laparoscopic bilateral salpingo-oophorectomy (removal of both ovaries and fallopian tubes), possible trachelectomy (removal of the cervix), possible laparotomy (larger incision on your abdomen if needed).    We will need to obtain clearance for surgery from your cardiologist, rheumatologist, and ophthalmologist.    WE WILL OBTAIN RECOMMENDATIONS ABOUT YOUR MEDICATIONS INCLUDING WHEN TO STOP YOUR PLAVIX  AND RHEUM MEDS.   Pre-operative Testing -You will receive a phone call from presurgical testing at [facility] to arrange for a pre-operative appointment and lab work.   -Bring your insurance card, copy of an advanced directive if applicable, medication list   -At that visit, you will be asked to sign a consent for a possible blood transfusion in case a transfusion becomes necessary during surgery.  The need for a blood transfusion is rare but having consent is a necessary part of your care.      -Do not take supplements such as fish oil (omega 3), red yeast rice, turmeric before your surgery. STOP TAKING AT LEAST 10 DAYS BEFORE SURGERY. You want to avoid medications with aspirin  in them including headache powders such as BC or Goody's), Excedrin migraine.   Day Before Surgery at Home -You will be asked to take in a light diet the day before surgery. You will be advised you can have clear liquids up until 3 hours before your surgery.     Eat a light diet the day before surgery.  Examples including soups, broths, toast, yogurt, mashed potatoes.  AVOID GAS PRODUCING FOODS AND BEVERAGES. Things to avoid include carbonated beverages (fizzy beverages, sodas), raw fruits and raw vegetables (uncooked), or beans.    If your bowels are filled with gas, your surgeon will have difficulty visualizing your pelvic organs which increases your  surgical risks.   Your role in recovery Your role is to become active as soon as directed by your doctor, while still giving yourself time to heal.  Rest when you feel tired. You will be asked to do the following in order to speed your recovery:   - Cough and breathe deeply. This helps to clear and expand your lungs and can prevent pneumonia after surgery.  - STAY ACTIVE WHEN YOU GET HOME. Do mild physical activity. Walking or moving your legs help your circulation and body functions return to normal. Do not try to get up or walk alone the first time after surgery.   -If you develop swelling on one leg or the other, pain in the back of your leg, redness/warmth in one of your legs, please call the office or go to the Emergency Room to have a doppler to rule out a blood clot. For shortness of breath, chest pain-seek care in the Emergency Room as soon as possible. - Actively manage your pain. Managing your pain lets you move in comfort. We will ask you to rate your pain on a scale of zero to 10. It is your responsibility to tell your doctor or nurse where and how much you hurt so your pain can be treated.   Special Considerations -If you are diabetic, you may be placed on insulin  after surgery to have closer control over your blood sugars to promote healing and recovery.  This does not mean that you will be discharged on insulin .  If applicable, your oral antidiabetics will be resumed when you  are tolerating a solid diet.   -Your final pathology results from surgery should be available around one week after surgery and the results will be relayed to you when available.   -FMLA forms can be faxed to [phone] and please allow 5-7 business days for completion.   Pain Management After Surgery -You will be prescribed your pain medication and bowel regimen medications before surgery so that you can have these available when you are discharged from the hospital. The pain medication is for use ONLY AFTER  surgery and a new prescription will not be given.    -Make sure that you have Tylenol  IF YOU ARE ABLE TO TAKE THESE MEDICATION at home to use on a regular basis after surgery for pain control.    -Review the attached handout on narcotic use and their risks and side effects.    Bowel Regimen -You will be prescribed Sennakot-S to take nightly to prevent constipation especially if you are taking the narcotic pain medication intermittently.  It is important to prevent constipation and drink adequate amounts of liquids. You can stop taking this medication when you are not taking pain medication and you are back on your normal bowel routine.   Risks of Surgery Risks of surgery are low but include bleeding, infection, damage to surrounding structures, re-operation, blood clots, and very rarely death.     Blood Transfusion Information (For the consent to be signed before surgery)   We will be checking your blood type before surgery so in case of emergencies, we will know what type of blood you would need.                                             WHAT IS A BLOOD TRANSFUSION?   A transfusion is the replacement of blood or some of its parts. Blood is made up of multiple cells which provide different functions. Red blood cells carry oxygen and are used for blood loss replacement. White blood cells fight against infection. Platelets control bleeding. Plasma helps clot blood. Other blood products are available for specialized needs, such as hemophilia or other clotting disorders. BEFORE THE TRANSFUSION  Who gives blood for transfusions?  You may be able to donate blood to be used at a later date on yourself (autologous donation). Relatives can be asked to donate blood. This is generally not any safer than if you have received blood from a stranger. The same precautions are taken to ensure safety when a relative's blood is donated. Healthy volunteers who are fully evaluated to make sure their blood  is safe. This is blood bank blood. Transfusion therapy is the safest it has ever been in the practice of medicine. Before blood is taken from a donor, a complete history is taken to make sure that person has no history of diseases nor engages in risky social behavior (examples are intravenous drug use or sexual activity with multiple partners). The donor's travel history is screened to minimize risk of transmitting infections, such as malaria. The donated blood is tested for signs of infectious diseases, such as HIV and hepatitis. The blood is then tested to be sure it is compatible with you in order to minimize the chance of a transfusion reaction. If you or a relative donates blood, this is often done in anticipation of surgery and is not appropriate for emergency situations. It takes many  days to process the donated blood. RISKS AND COMPLICATIONS Although transfusion therapy is very safe and saves many lives, the main dangers of transfusion include:  Getting an infectious disease. Developing a transfusion reaction. This is an allergic reaction to something in the blood you were given. Every precaution is taken to prevent this. The decision to have a blood transfusion has been considered carefully by your caregiver before blood is given. Blood is not given unless the benefits outweigh the risks.   AFTER SURGERY INSTRUCTIONS   Return to work: 4-6 weeks if applicable   Activity: 1. Be up and out of the bed during the day.  Take a nap if needed.  You may walk up steps but be careful and use the hand rail.  Stair climbing will tire you more than you think, you may need to stop part way and rest.    2. No lifting or straining for 6 weeks over 10 pounds. No pushing, pulling, straining for 6 weeks.   3. No driving for 2-95 days when the following criteria have been met: Do not drive if you are taking narcotic pain medicine and make sure that your reaction time has returned.    4. You can shower as soon  as the next day after surgery. Shower daily.  Use your regular soap and water (not directly on the incision) and pat your incision(s) dry afterwards; don't rub.  No tub baths or submerging your body in water until cleared by your surgeon. If you have the soap that was given to you by pre-surgical testing that was used before surgery, you do not need to use it afterwards because this can irritate your incisions.    5. No sexual activity and nothing in the vagina for 12 weeks.   6. You may experience a small amount of clear drainage from your incisions, which is normal.  If the drainage persists, increases, or changes color please call the office.   7. Do not use creams, lotions, or ointments such as neosporin on your incisions after surgery until advised by your surgeon because they can cause removal of the dermabond glue on your incisions.     8. You may experience vaginal spotting after surgery or when the stitches at the top of the vagina begin to dissolve.  The spotting is normal but if you experience heavy bleeding, call our office.   9. Take Tylenol  first for pain if you are able to take these medication and only use narcotic pain medication for severe pain not relieved by the Tylenol .  Monitor your Tylenol  intake to a max of 4,000 mg in a 24 hour period.   Diet: 1. Low sodium Heart Healthy Diet is recommended but you are cleared to resume your normal (before surgery) diet after your procedure.   2. It is safe to use a laxative, such as Miralax or Colace, if you have difficulty moving your bowels before surgery. You have been prescribed Sennakot-S to take at bedtime every evening after surgery to keep bowel movements regular and to prevent constipation.     Wound Care: 1. Keep clean and dry.  Shower daily.   Reasons to call the Doctor: Fever - Oral temperature greater than 100.4 degrees Fahrenheit Foul-smelling vaginal discharge Difficulty urinating Nausea and vomiting Increased pain at  the site of the incision that is unrelieved with pain medicine. Difficulty breathing with or without chest pain New calf pain especially if only on one side Sudden, continuing increased vaginal bleeding  with or without clots.   Contacts: For questions or concerns you should contact:   Dr. Dana Duncan [id number]/19/2024cker at [phone]   Vira Grieves, NP at [phone]   After Hours: call [phone] and have the GYN Oncologist paged/contacted (after 5 pm or on the weekends). You will speak with an after hours RN and let he or she know you have had surgery.   Messages sent via mychart are for non-urgent matters and are not responded to after hours so for urgent needs, please call the after hours number.

## 2023-08-02 NOTE — Patient Instructions (Addendum)
 Preparing for your Surgery  Plan for surgery on September 05, 2023 with Dr. Wiley Hanger at [facility]. You will be scheduled for robotic assisted laparoscopic bilateral salpingo-oophorectomy (removal of both ovaries and fallopian tubes), possible trachelectomy (removal of the cervix), possible laparotomy (larger incision on your abdomen if needed).   We will need to obtain clearance for surgery from your cardiologist, rheumatologist, and ophthalmologist.   WE WILL OBTAIN RECOMMENDATIONS ABOUT YOUR MEDICATIONS INCLUDING WHEN TO STOP YOUR PLAVIX  AND RHEUM MEDS.  Pre-operative Testing -You will receive a phone call from presurgical testing at [facility] to arrange for a pre-operative appointment and lab work.  -Bring your insurance card, copy of an advanced directive if applicable, medication list  -At that visit, you will be asked to sign a consent for a possible blood transfusion in case a transfusion becomes necessary during surgery.  The need for a blood transfusion is rare but having consent is a necessary part of your care.     -Do not take supplements such as fish oil (omega 3), red yeast rice, turmeric before your surgery. STOP TAKING AT LEAST 10 DAYS BEFORE SURGERY. You want to avoid medications with aspirin  in them including headache powders such as BC or Goody's), Excedrin migraine.  Day Before Surgery at Home -You will be asked to take in a light diet the day before surgery. You will be advised you can have clear liquids up until 3 hours before your surgery.    Eat a light diet the day before surgery.  Examples including soups, broths, toast, yogurt, mashed potatoes.  AVOID GAS PRODUCING FOODS AND BEVERAGES. Things to avoid include carbonated beverages (fizzy beverages, sodas), raw fruits and raw vegetables (uncooked), or beans.   If your bowels are filled with gas, your surgeon will have difficulty visualizing your pelvic organs which increases your surgical  risks.  Your role in recovery Your role is to become active as soon as directed by your doctor, while still giving yourself time to heal.  Rest when you feel tired. You will be asked to do the following in order to speed your recovery:  - Cough and breathe deeply. This helps to clear and expand your lungs and can prevent pneumonia after surgery.  - STAY ACTIVE WHEN YOU GET HOME. Do mild physical activity. Walking or moving your legs help your circulation and body functions return to normal. Do not try to get up or walk alone the first time after surgery.   -If you develop swelling on one leg or the other, pain in the back of your leg, redness/warmth in one of your legs, please call the office or go to the Emergency Room to have a doppler to rule out a blood clot. For shortness of breath, chest pain-seek care in the Emergency Room as soon as possible. - Actively manage your pain. Managing your pain lets you move in comfort. We will ask you to rate your pain on a scale of zero to 10. It is your responsibility to tell your doctor or nurse where and how much you hurt so your pain can be treated.  Special Considerations -If you are diabetic, you may be placed on insulin  after surgery to have closer control over your blood sugars to promote healing and recovery.  This does not mean that you will be discharged on insulin .  If applicable, your oral antidiabetics will be resumed when you are tolerating a solid diet.  -Your final pathology results from surgery should  be available around one week after surgery and the results will be relayed to you when available.  -FMLA forms can be faxed to [phone] and please allow 5-7 business days for completion.  Pain Management After Surgery -You will be prescribed your pain medication and bowel regimen medications before surgery so that you can have these available when you are discharged from the hospital. The pain medication is for use ONLY AFTER surgery and a  new prescription will not be given.   -Make sure that you have Tylenol  IF YOU ARE ABLE TO TAKE THESE MEDICATION at home to use on a regular basis after surgery for pain control.   -Review the attached handout on narcotic use and their risks and side effects.   Bowel Regimen -You will be prescribed Sennakot-S to take nightly to prevent constipation especially if you are taking the narcotic pain medication intermittently.  It is important to prevent constipation and drink adequate amounts of liquids. You can stop taking this medication when you are not taking pain medication and you are back on your normal bowel routine.  Risks of Surgery Risks of surgery are low but include bleeding, infection, damage to surrounding structures, re-operation, blood clots, and very rarely death.   Blood Transfusion Information (For the consent to be signed before surgery)  We will be checking your blood type before surgery so in case of emergencies, we will know what type of blood you would need.                                            WHAT IS A BLOOD TRANSFUSION?  A transfusion is the replacement of blood or some of its parts. Blood is made up of multiple cells which provide different functions. Red blood cells carry oxygen and are used for blood loss replacement. White blood cells fight against infection. Platelets control bleeding. Plasma helps clot blood. Other blood products are available for specialized needs, such as hemophilia or other clotting disorders. BEFORE THE TRANSFUSION  Who gives blood for transfusions?  You may be able to donate blood to be used at a later date on yourself (autologous donation). Relatives can be asked to donate blood. This is generally not any safer than if you have received blood from a stranger. The same precautions are taken to ensure safety when a relative's blood is donated. Healthy volunteers who are fully evaluated to make sure their blood is safe. This is blood  bank blood. Transfusion therapy is the safest it has ever been in the practice of medicine. Before blood is taken from a donor, a complete history is taken to make sure that person has no history of diseases nor engages in risky social behavior (examples are intravenous drug use or sexual activity with multiple partners). The donor's travel history is screened to minimize risk of transmitting infections, such as malaria. The donated blood is tested for signs of infectious diseases, such as HIV and hepatitis. The blood is then tested to be sure it is compatible with you in order to minimize the chance of a transfusion reaction. If you or a relative donates blood, this is often done in anticipation of surgery and is not appropriate for emergency situations. It takes many days to process the donated blood. RISKS AND COMPLICATIONS Although transfusion therapy is very safe and saves many lives, the main dangers of transfusion include:  Getting an infectious disease. Developing a transfusion reaction. This is an allergic reaction to something in the blood you were given. Every precaution is taken to prevent this. The decision to have a blood transfusion has been considered carefully by your caregiver before blood is given. Blood is not given unless the benefits outweigh the risks.  AFTER SURGERY INSTRUCTIONS  Return to work: 4-6 weeks if applicable  Activity: 1. Be up and out of the bed during the day.  Take a nap if needed.  You may walk up steps but be careful and use the hand rail.  Stair climbing will tire you more than you think, you may need to stop part way and rest.   2. No lifting or straining for 6 weeks over 10 pounds. No pushing, pulling, straining for 6 weeks.  3. No driving for 6-96 days when the following criteria have been met: Do not drive if you are taking narcotic pain medicine and make sure that your reaction time has returned.   4. You can shower as soon as the next day after  surgery. Shower daily.  Use your regular soap and water (not directly on the incision) and pat your incision(s) dry afterwards; don't rub.  No tub baths or submerging your body in water until cleared by your surgeon. If you have the soap that was given to you by pre-surgical testing that was used before surgery, you do not need to use it afterwards because this can irritate your incisions.   5. No sexual activity and nothing in the vagina for 12 weeks.  6. You may experience a small amount of clear drainage from your incisions, which is normal.  If the drainage persists, increases, or changes color please call the office.  7. Do not use creams, lotions, or ointments such as neosporin on your incisions after surgery until advised by your surgeon because they can cause removal of the dermabond glue on your incisions.    8. You may experience vaginal spotting after surgery or when the stitches at the top of the vagina begin to dissolve.  The spotting is normal but if you experience heavy bleeding, call our office.  9. Take Tylenol  first for pain if you are able to take these medication and only use narcotic pain medication for severe pain not relieved by the Tylenol .  Monitor your Tylenol  intake to a max of 4,000 mg in a 24 hour period.  Diet: 1. Low sodium Heart Healthy Diet is recommended but you are cleared to resume your normal (before surgery) diet after your procedure.  2. It is safe to use a laxative, such as Miralax or Colace, if you have difficulty moving your bowels before surgery. You have been prescribed Sennakot-S to take at bedtime every evening after surgery to keep bowel movements regular and to prevent constipation.    Wound Care: 1. Keep clean and dry.  Shower daily.  Reasons to call the Doctor: Fever - Oral temperature greater than 100.4 degrees Fahrenheit Foul-smelling vaginal discharge Difficulty urinating Nausea and vomiting Increased pain at the site of the incision that  is unrelieved with pain medicine. Difficulty breathing with or without chest pain New calf pain especially if only on one side Sudden, continuing increased vaginal bleeding with or without clots.   Contacts: For questions or concerns you should contact:  Dr. Wiley Hanger at [phone]  Vira Grieves, NP at [phone]  After Hours: call [phone] and have the GYN Oncologist paged/contacted (after 5 pm or  on the weekends). You will speak with an after hours RN and let he or she know you have had surgery.  Messages sent via mychart are for non-urgent matters and are not responded to after hours so for urgent needs, please call the after hours number.

## 2023-08-02 NOTE — Telephone Encounter (Signed)
 Faxed the following clearances to be the appropriate providers:  Cardiac: Dr Charles Connor [phone] Rhematology: Dr Henrine Logan [phone] Ophthalmology: Dr Rodrigo Clara [phone]

## 2023-08-02 NOTE — Telephone Encounter (Signed)
 Received fax confirmation that all 3 faxes have been received in the appropriate offices.

## 2023-08-02 NOTE — Progress Notes (Signed)
 Patient here for new patient consultation with Dr. Orvil Bland and for a pre-operative appointment prior to her scheduled surgery on 09/05/2023. She is scheduled for robotic assisted laparoscopic bilateral salpingo-oophorectomy, possible trachelectomy, possible laparotomy. The surgery was discussed in detail.  See after visit summary for additional details.    Discussed post-op pain management in detail including the aspects of the enhanced recovery pathway.  Advised her that a new prescription would be sent in closer to the surgery date. We discussed the use of tylenol  post-op and to monitor for a maximum of 4,000 mg in a 24 hour period.  Also plan to prescribe sennakot to be used after surgery and to hold if having loose stools.  Discussed bowel regimen in detail.     Discussed measures to take at home to prevent DVT including frequent mobility.  Reportable signs and symptoms of DVT discussed. Post-operative instructions discussed and expectations for after surgery. Incisional care discussed as well including reportable signs and symptoms including erythema, drainage, wound separation.     10 minutes spent preparing information and with the patient.  Verbalizing understanding of material discussed. No needs or concerns voiced at the end of the visit.   Advised patient to call for any needs.   This appointment is included in the global surgical bundle as pre-operative teaching and has no charge.

## 2023-08-05 DIAGNOSIS — L658 Other specified nonscarring hair loss: Secondary | ICD-10-CM | POA: Diagnosis not present

## 2023-08-05 DIAGNOSIS — L6681 Central centrifugal cicatricial alopecia: Secondary | ICD-10-CM | POA: Diagnosis not present

## 2023-08-05 DIAGNOSIS — L409 Psoriasis, unspecified: Secondary | ICD-10-CM | POA: Diagnosis not present

## 2023-08-06 ENCOUNTER — Telehealth: Payer: Self-pay

## 2023-08-06 NOTE — Telephone Encounter (Addendum)
 5/225 LVM for Pamela Lowe to call office for potential surgery dates with Dr.Tucker as reported by Vira Grieves [id number] dates in July include July 2, 3, 9, 16, 17, and 23. In August, dates include 6, 13, 20, 21. Please let me know what she decides.   Ms.Raczka called stating she would like to reschedule her surgery with Dr.Tucker from 6/19 to after July. Reports she has made a lot of previous engagements in June and knows after the surgery she will not fill up to doing much for awhile.   Aware I will pass message on to Vira Grieves NP and will call her back with potential surgery dates.

## 2023-08-07 DIAGNOSIS — L4050 Arthropathic psoriasis, unspecified: Secondary | ICD-10-CM | POA: Diagnosis not present

## 2023-08-08 ENCOUNTER — Telehealth: Payer: Self-pay | Admitting: *Deleted

## 2023-08-08 NOTE — Telephone Encounter (Signed)
 Copied from CRM [id number]. Topic: Clinical - Medical Advice >> Aug 08, 2023  2:07 PM Pamela Lowe wrote: Reason for CRM: Patient calling in to request that Lung Cancer Screening CT be re-ordered, as it expired when patient went to schedule it.  She was last seen in February of 2024.

## 2023-08-09 ENCOUNTER — Other Ambulatory Visit: Payer: Self-pay

## 2023-08-09 DIAGNOSIS — I25758 Atherosclerosis of native coronary artery of transplanted heart with other forms of angina pectoris: Secondary | ICD-10-CM | POA: Diagnosis not present

## 2023-08-09 DIAGNOSIS — I739 Peripheral vascular disease, unspecified: Secondary | ICD-10-CM | POA: Diagnosis not present

## 2023-08-09 NOTE — Telephone Encounter (Signed)
 Left voicemail for pt to call to schedule yearly lung screening CT.

## 2023-08-10 LAB — LIPID PANEL
Chol/HDL Ratio: 1.5 ratio (ref 0.0–4.4)
Cholesterol, Total: 98 mg/dL — ABNORMAL LOW (ref 100–199)
HDL: 67 mg/dL (ref >39)
LDL Chol Calc (NIH): 17 mg/dL (ref 0–99)
Triglycerides: 60 mg/dL (ref 0–149)
VLDL Cholesterol Cal: 14 mg/dL (ref 5–40)

## 2023-08-13 ENCOUNTER — Ambulatory Visit: Payer: Self-pay | Admitting: Cardiovascular Disease

## 2023-08-16 ENCOUNTER — Other Ambulatory Visit: Payer: Self-pay | Admitting: Acute Care

## 2023-08-16 DIAGNOSIS — Z87891 Personal history of nicotine dependence: Secondary | ICD-10-CM

## 2023-08-16 DIAGNOSIS — Z122 Encounter for screening for malignant neoplasm of respiratory organs: Secondary | ICD-10-CM

## 2023-08-27 NOTE — Progress Notes (Unsigned)
 08/28/2023 Pamela Lowe [id number] June 23, 1950  Referring provider: Benjiman Bras, MD Primary GI doctor: Dr. Brice Campi  ASSESSMENT AND PLAN:  Rectal bleeding likely hemorrhoids, some AB pain has history of diverticulosis 06/24/2023 colonoscopy at [facility] with good prep internal and external hemorrhoids, 4 mm TA polyp in ascending colon diverticulosis normal mucosa repeat colonoscopy 1 year Over a year intermittent bleeding every few months - with AB pain and history will get CT AB and pelvis with contrast -Sitz baths, increase fiber, increase water -Hydrocortisone supp given sent in.  -We discussed hemorrhoid banding here in the office for internal hemorrhoids if not improving with conservative treatment. - follow up for evaluation here in the office.  - likely does not need repeat colonoscopy at this time, consider repeat flex sig if continuing bleeding or abnormal CT  GERD history of esophagitis and Lynch syndrome EGD 06/24/23 at [place] grade a, 2 cm HH erythematous mucosa cardia gastric fundus normal duodenum Recall endoscopy every 2 to 4 years consider earlier if repeat endoscopy for esophagitis is warranted, negative H. pylori gastritis Can have some GERD at night, some epigastric tenderness Has been off pantoprazole  for about a year due to fear of dementia, on pepcid as needed - continue 20 mg BID for 3 months -alginate therapy given -Lifestyle changes discussed, avoid NSAIDS, ETOH, hand out given to the patient  Lynch syndrome with personal history of polyps Status post supracervical hysterectomy 09/2023 plan for salpingo-oophorectomy, possible trachelectomy, possible laparotomy, and any other indicated procedures  06/24/2023 colonoscopy at [facility] with good prep internal and external hemorrhoids, 4 mm TA polyp in ascending colon diverticulosis normal mucosa repeat colonoscopy 1 year  Rheumatoid arthritis positive RF On simponia and arava 2-3 months ago, stopped  methotrexate due to CKD  CKD stage IIIa  CAD/PAD status post PCI 2017 On Plavix  No chest pain or SOB  Meningioma Status post brain tumor excision  Adrenal insufficiency Abnormal PET of adrenal gland 1 year ago Repeat CT AB and pelvis with contrast  Patient Care Team: Benjiman Bras, MD as PCP - General (Family Medicine) Arty Binning, MD (Inactive) as PCP - Cardiology (Cardiology) Arty Binning, MD (Inactive) as Consulting Physician (Cardiology) Alanda Allegra, MD as Referring Physician (Ophthalmology) Illona Malone, Karlyn Overman, MD as Referring Physician (Ophthalmology) Godwin Lat, Sherida Dimmer, MD (Neurology) Janel Medford, MD (Inactive) as Attending Physician (Gastroenterology) Florance Hun, MD as Consulting Physician (Ophthalmology) Alvester Aw, MD (Rheumatology) Tasia Farr, MD as Referring Physician (Endocrinology) Lillian Rein, MD as Consulting Physician (Obstetrics and Gynecology) Thresa Floor, Primus Brookes, MD as Referring Physician (Otolaryngology) Zomorodi, Ali, MD (Neurosurgery)  HISTORY OF PRESENT ILLNESS: 73 y.o. female with a past medical history listed below presents for evaluation of rectal bleeding.   Patient well-known to Dr. Brice Campi for Lynch syndrome last seen in the office 05/14/2023.  Discussed the use of AI scribe software for clinical note transcription with the patient, who gave verbal consent to proceed.  History of Present Illness   Pamela Lowe is a 73 year old female with Lynch syndrome who presents with rectal bleeding.  She has experienced intermittent rectal bleeding for over a year, occurring sporadically every few months, typically after bowel movements. The bleeding is not consistent, and she has documented it with a picture of her stool. No rectal pain or straining during bowel movements, and her stool is not hard. She uses Fibercon and previously used Metamucil to manage her bowel movements.  She has  a history of Lynch syndrome  and undergoes annual colonoscopies, the last of which was in April 2025. During her last colonoscopy, she was informed of internal and external hemorrhoids, a small polyp, and diverticulosis. An endoscopy at the same time revealed a hiatal hernia and inflammation.  Her past medical history includes a stent placement in 2017, for which she is on Plavix . No chest pain or shortness of breath. She also has a history of meningioma removal and rheumatoid arthritis, for which she receives Simponi  Aria infusions every eight weeks and was recently switched from methotrexate to leflunomide due to kidney concerns.  She experiences symptoms of gastroesophageal reflux disease (GERD), particularly at night, and takes Pepcid as needed, having switched from pantoprazole  about a year ago. She also takes a low dose aspirin , minoxidil, and rosuvastatin  at night. Occasional abdominal discomfort is associated with spicy foods or acid reflux. No night sweats or significant weight loss, although she has intentionally lost 45 pounds through a weight loss program.  Her surgical history includes a partial cervical hysterectomy, and she is scheduled for an oophorectomy in July. She is actively managing her weight and dietary habits, having lost 45 pounds through a weight loss program without the use of injectables.      She  reports that she quit smoking about 6 years ago. Her smoking use included cigarettes. She started smoking about 42 years ago. She has a 9 pack-year smoking history. She has never used smokeless tobacco. She reports that she does not currently use alcohol. She reports that she does not use drugs.  RELEVANT GI HISTORY, IMAGING AND LABS: Results   LABS Hb: Normal (08/07/2023)  RADIOLOGY PET scan: No hypermetabolic activity in pulmonary lobe nodule, left adrenal hypermetabolism without CT correlate (04/2022) CT chest: Follow-up for lung nodule Pelvic transvaginal ultrasound: Evaluated uterus and  ovaries  DIAGNOSTIC REPORTS Colonoscopy: Internal and external hemorrhoids, small polyp, diverticulosis (06/24/2023) Endoscopy: Hiatal hernia, esophageal erythema (06/24/2023)     October 2023 EGD - No gross lesions in the proximal esophagus and in the mid esophagus. [place] Grade A esophagitis with no bleeding found in the very distal esophagus/GE junction. - Z-line regular, 40 cm from the incisors. - 2 cm hiatal hernia. - Erythematous mucosa in the antrum. No other gross lesions in the entire stomach. Biopsied. - No gross lesions in the duodenal bulb, in the first portion of the duodenum and in the second portion of the duodenum.   October 2023 Colonoscopy - Hemorrhoids found on digital rectal exam. - The examined portion of the ileum was normal. - One 4 mm polyp in the sigmoid colon, removed with a cold snare. Resected and retrieved. - Diverticulosis in the recto-sigmoid colon, in the sigmoid colon and in the descending colon. - Normal mucosa in the entire examined colon otherwise. - Non-bleeding non-thrombosed external and internal hemorrhoids.   Pathology FINAL MICROSCOPIC DIAGNOSIS:  A. STOMACH, BIOPSY:  Fragments of gastric mucosa with minimal vascular ectasia.  H. pylori, intestinal metaplasia, atrophy and dysplasia are not  identified.  B. COLON, SIGMOID, POLYPECTOMY:  Hyperplastic polyp.  Negative for dysplasia.  CBC    Component Value Date/Time   WBC 9.8 03/27/2022 1209   RBC 4.67 03/27/2022 1209   HGB 14.2 03/27/2022 1209   HGB 13.5 04/06/2013 1527   HCT 43.2 03/27/2022 1209   PLT 319.0 03/27/2022 1209   MCV 92.6 03/27/2022 1209   MCH 30.7 12/18/2021 1507   MCHC 33.0 03/27/2022 1209   RDW 14.4 03/27/2022 1209  LYMPHSABS 3.0 12/18/2021 1507   MONOABS 0.5 12/18/2021 1507   EOSABS 0.1 12/18/2021 1507   BASOSABS 0.1 12/18/2021 1507   No results for input(s): HGB in the last 8760 hours.  CMP     Component Value Date/Time   NA 140 07/10/2023 1422   NA 144  04/02/2023 1056   K 3.5 07/10/2023 1422   CL 101 07/10/2023 1422   CO2 33 (H) 07/10/2023 1422   GLUCOSE 110 (H) 07/10/2023 1422   BUN 22 07/10/2023 1422   BUN 19 04/02/2023 1056   CREATININE 1.16 07/10/2023 1422   CREATININE 1.41 (H) 12/16/2012 1554   CALCIUM  10.0 07/10/2023 1422   PROT 7.2 07/10/2023 1422   PROT 6.6 04/02/2023 1056   ALBUMIN 4.1 07/10/2023 1422   ALBUMIN 4.0 04/02/2023 1056   AST 36 07/10/2023 1422   ALT 28 07/10/2023 1422   ALKPHOS 54 07/10/2023 1422   BILITOT 1.3 (H) 07/10/2023 1422   BILITOT 0.9 04/02/2023 1056   GFRNONAA 53 (L) 11/02/2019 1814   GFRAA >60 11/02/2019 1814      Latest Ref Rng & Units 07/10/2023    2:22 PM 06/27/2023    7:42 AM 04/02/2023   10:56 AM  Hepatic Function  Total Protein 6.0 - 8.3 g/dL 7.2  6.8  6.6   Albumin 3.5 - 5.2 g/dL 4.1  4.0  4.0   AST 0 - 37 U/L 36  38  39   ALT 0 - 35 U/L 28  33  40   Alk Phosphatase 39 - 117 U/L 54  48  59   Total Bilirubin 0.2 - 1.2 mg/dL 1.3  1.7  0.9       Current Medications:    Current Outpatient Medications (Cardiovascular):    Evolocumab  (REPATHA  SURECLICK) 140 MG/ML SOAJ, Inject 140 mg into the skin every 14 (fourteen) days.   ezetimibe  (ZETIA ) 10 MG tablet, TAKE 1 TABLET BY MOUTH DAILY   hydrochlorothiazide  (MICROZIDE ) 12.5 MG capsule, TAKE 1 CAPSULE(12.5 MG) BY MOUTH DAILY   metoprolol  succinate (TOPROL -XL) 25 MG 24 hr tablet, Take 1 tablet (25 mg total) by mouth daily.   minoxidil (LONITEN) 2.5 MG tablet, Take 2.5 mg by mouth daily.   rosuvastatin  (CRESTOR ) 20 MG tablet, Take 20 mg by mouth daily.   Current Outpatient Medications (Analgesics):    aspirin  81 MG EC tablet, Take 81 mg by mouth every evening.    leflunomide (ARAVA) 20 MG tablet, Take 20 mg by mouth daily.   SIMPONI  ARIA 50 MG/4ML SOLN injection, Inject 50 mg into the vein. Every 60 Days  Current Outpatient Medications (Hematological):    clopidogrel  (PLAVIX ) 75 MG tablet, Take 1 tablet (75 mg total) by mouth daily.    folic acid  (FOLVITE ) 1 MG tablet, Take 1 mg by mouth daily.   Current Outpatient Medications (Other):    B Complex Vitamins (VITAMIN B COMPLEX) TABS, Take 1 tablet by mouth daily.   betamethasone , augmented, (DIPROLENE ) 0.05 % lotion, Apply topically.   brimonidine  (ALPHAGAN ) 0.2 % ophthalmic solution, Place 1 drop into both eyes 3 (three) times daily.    Calcium -Vitamin D -Vitamin K (CALCIUM  + D PO),    Cholecalciferol (VITAMIN D ) 50 MCG (2000 UT) tablet, Take 2,000 Units by mouth daily.   dorzolamide -timolol (COSOPT) 22.3-6.8 MG/ML ophthalmic solution, Place 1 drop into the right eye 2 (two) times daily.   famotidine (PEPCID) 20 MG tablet, Take 20 mg by mouth 2 (two) times daily.   hydrocortisone (ANUSOL-HC) 25 MG suppository, Place  1 suppository (25 mg total) rectally 2 (two) times daily.   Multiple Vitamins-Minerals (MULTIVITAMIN WITH MINERALS) tablet, Take 1 tablet by mouth daily.   Nutritional Supplements (VITAL HIGH PROTEIN PO), Take by mouth.   Omega-3 Fatty Acids (FISH OIL) 1000 MG CAPS, Take 1 capsule by mouth daily.    VYZULTA 0.024 % SOLN, Apply to eye. Right eye  Medical History:  Past Medical History:  Diagnosis Date   Allergic rhinitis    Allergy    Arthritis    Brain tumor (HCC)    Breast cyst 2007   Right   Cataract    surgery 07/2017   Clotting disorder (HCC)    On plavix  since stents  for artery blockages   Depression 2006   w/ menopause   Depression screening 09/27/2021   Family history of adverse reaction to anesthesia    sister PONV   Family history of breast cancer    Family history of colon cancer    Family history of kidney cancer 01/15/2018   Family history of lung cancer    Family history of pancreatic cancer    Family history of prostate cancer    Family history of uterine cancer    GERD (gastroesophageal reflux disease)    Glaucoma    Heart murmur    History of hysterectomy, supracervical    Hyperlipidemia    Hypertension    Lynch syndrome  02/07/2018   MLH1 [id number] (Splice site)   Multinodular goiter    PAD (peripheral artery disease) (HCC)    LE Art US  1/18: Occluded prox-mid R SFA // ABIs 9/22: R 0.83; L 1.0   Pustular psoriasis    Tobacco user    Allergies:  Allergies  Allergen Reactions   Sulfamethoxazole-Trimethoprim Other (See Comments) and Rash    Chills/achy Flu like symptoms    Conjugated Estrogens     Other reaction(s): rash   Wellbutrin [Bupropion Hcl] Rash    rash     Surgical History:  She  has a past surgical history that includes Breast biopsy (Bilateral, 1985); Cataract extraction (Right, 07/2017); Supracervical abdominal hysterectomy (1986); Colonoscopy; Upper gastrointestinal endoscopy; LEFT HEART CATH AND CORONARY ANGIOGRAPHY (N/A, 12/17/2018); CORONARY STENT INTERVENTION (N/A, 12/26/2018); Cardiac catheterization; Brain tumor excision (02/24/2021); Eye surgery (2019, 2020); Esophagogastroduodenoscopy (egd) with propofol  (N/A, 01/15/2022); Colonoscopy with propofol  (N/A, 01/15/2022); biopsy (01/15/2022); polypectomy (01/15/2022); Colonoscopy with propofol  (N/A, 06/24/2023); Esophagogastroduodenoscopy (egd) with propofol  (N/A, 06/24/2023); Bone biopsy (06/24/2023); and Polypectomy (06/24/2023). Family History:  Her family history includes Allergies in her brother; Alzheimer's disease in her mother; Arthritis in her father and mother; Brain cancer in her paternal aunt; Breast cancer (age of onset: 73) in her sister; Breast cancer (age of onset: 24) in her sister; Breast cancer (age of onset: 34) in her sister; Breast cancer (age of onset: 39) in her cousin; Breast cancer (age of onset: 68) in her maternal aunt; Cancer in her sister, sister, and sister; Colon cancer (age of onset: 5) in her paternal grandfather; Deep vein thrombosis in her mother and sister; Early death in her brother, father, and sister; Heart attack in her maternal grandfather; Heart attack (age of onset: 59) in her brother;  Hypertension in her father and mother; Kidney cancer (age of onset: 36) in her cousin; Kidney cancer (age of onset: 78) in her father; Kidney cancer (age of onset: 32) in her cousin; Kidney disease in her father; Lung cancer (age of onset: 54) in her paternal uncle; Lung cancer (age of onset: 53) in  her maternal uncle; Osteopenia in her mother; Pancreatic cancer (age of onset: 74) in her paternal uncle; Pancreatic cancer (age of onset: 71) in her paternal aunt; Pancreatic cancer (age of onset: 39) in her paternal aunt; Pneumonia in her father; Prostate cancer (age of onset: 41) in her cousin; Prostate cancer (age of onset: 44) in her maternal uncle; Uterine cancer (age of onset: 36) in her cousin; Uterine cancer (age of onset: 70) in her paternal grandmother; Varicose Veins in her mother.  REVIEW OF SYSTEMS  : All other systems reviewed and negative except where noted in the History of Present Illness.  PHYSICAL EXAM: BP 112/72   Pulse 74   Ht 5' 7 (1.702 m)   Wt 193 lb (87.5 kg)   LMP 03/19/1998 Comment: h/o supracervical hysterectomy  BMI 30.23 kg/m  Physical Exam   GENERAL APPEARANCE: Well nourished, in no apparent distress. HEENT: No cervical lymphadenopathy, unremarkable thyroid , sclerae anicteric, conjunctiva pink. RESPIRATORY: Respiratory effort normal, breath sounds equal bilaterally without rales, rhonchi, or wheezing. CARDIO: Regular rate and rhythm with no murmurs, rubs, or gallops, peripheral pulses intact. ABDOMEN: Soft, non-distended, active bowel sounds in all four quadrants, no tenderness to palpation, no rebound, no mass appreciated. RECTAL: No significant external hemorrhoids, no fissure, internal hemorrhoids present, no blood. MUSCULOSKELETAL: Full range of motion, normal gait, without edema. SKIN: Dry, intact without rashes or lesions. No jaundice. NEURO: Alert, oriented, no focal deficits. PSYCH: Cooperative, normal mood and affect.      Edmonia Gottron, PA-C 11:34  AM

## 2023-08-28 ENCOUNTER — Ambulatory Visit: Admitting: Physician Assistant

## 2023-08-28 ENCOUNTER — Other Ambulatory Visit (INDEPENDENT_AMBULATORY_CARE_PROVIDER_SITE_OTHER)

## 2023-08-28 ENCOUNTER — Other Ambulatory Visit: Payer: Self-pay | Admitting: Cardiovascular Disease

## 2023-08-28 ENCOUNTER — Ambulatory Visit: Payer: Self-pay | Admitting: Physician Assistant

## 2023-08-28 ENCOUNTER — Encounter: Payer: Self-pay | Admitting: Physician Assistant

## 2023-08-28 VITALS — BP 112/72 | HR 74 | Ht 67.0 in | Wt 193.0 lb

## 2023-08-28 DIAGNOSIS — R1084 Generalized abdominal pain: Secondary | ICD-10-CM | POA: Diagnosis not present

## 2023-08-28 DIAGNOSIS — K2100 Gastro-esophageal reflux disease with esophagitis, without bleeding: Secondary | ICD-10-CM | POA: Diagnosis not present

## 2023-08-28 DIAGNOSIS — K625 Hemorrhage of anus and rectum: Secondary | ICD-10-CM

## 2023-08-28 DIAGNOSIS — Z860101 Personal history of adenomatous and serrated colon polyps: Secondary | ICD-10-CM

## 2023-08-28 DIAGNOSIS — Z1509 Genetic susceptibility to other malignant neoplasm: Secondary | ICD-10-CM

## 2023-08-28 DIAGNOSIS — I25119 Atherosclerotic heart disease of native coronary artery with unspecified angina pectoris: Secondary | ICD-10-CM

## 2023-08-28 DIAGNOSIS — I739 Peripheral vascular disease, unspecified: Secondary | ICD-10-CM

## 2023-08-28 DIAGNOSIS — I25758 Atherosclerosis of native coronary artery of transplanted heart with other forms of angina pectoris: Secondary | ICD-10-CM

## 2023-08-28 DIAGNOSIS — Z7902 Long term (current) use of antithrombotics/antiplatelets: Secondary | ICD-10-CM

## 2023-08-28 LAB — COMPREHENSIVE METABOLIC PANEL WITH GFR
ALT: 34 U/L (ref 0–35)
AST: 42 U/L — ABNORMAL HIGH (ref 0–37)
Albumin: 4.2 g/dL (ref 3.5–5.2)
Alkaline Phosphatase: 47 U/L (ref 39–117)
BUN: 20 mg/dL (ref 6–23)
CO2: 29 meq/L (ref 19–32)
Calcium: 9.5 mg/dL (ref 8.4–10.5)
Chloride: 105 meq/L (ref 96–112)
Creatinine, Ser: 1.03 mg/dL (ref 0.40–1.20)
GFR: 54.44 mL/min — ABNORMAL LOW (ref >60.00)
Glucose, Bld: 96 mg/dL (ref 70–99)
Potassium: 3.5 meq/L (ref 3.5–5.1)
Sodium: 145 meq/L (ref 135–145)
Total Bilirubin: 1.3 mg/dL — ABNORMAL HIGH (ref 0.2–1.2)
Total Protein: 6.8 g/dL (ref 6.0–8.3)

## 2023-08-28 MED ORDER — HYDROCORTISONE ACETATE 25 MG RE SUPP: 25.0000 mg | Freq: Two times a day (BID) | RECTAL | 0 refills | Status: DC

## 2023-08-28 NOTE — Patient Instructions (Addendum)
 Your provider has requested that you go to the basement level for lab work before leaving today. Press B on the elevator. The lab is located at the first door on the left as you exit the elevator.  We have sent the following medications to your pharmacy for you to pick up at your convenience:  Hydrocortisone suppositories twice daily  You have been scheduled for a CT scan of the abdomen and pelvis at [facility]. You are scheduled on 09-05-23 at 2pm. You should arrive at 12pm Please follow the written instructions below on the day of your exam:   1) Do not eat anything after 10am (4 hours prior to your test)   You may take any medications as prescribed with a small amount of water, if necessary. If you take any of the following medications: METFORMIN, GLUCOPHAGE, GLUCOVANCE, AVANDAMET, RIOMET, FORTAMET, ACTOPLUS MET, JANUMET, GLUMETZA or METAGLIP, you MAY be asked to HOLD this medication 48 hours AFTER the exam.   The purpose of you drinking the oral contrast is to aid in the visualization of your intestinal tract. The contrast solution may cause some diarrhea. Depending on your individual set of symptoms, you may also receive an intravenous injection of x-ray contrast/dye. Plan on being at [facility] for 45 minutes or longer, depending on the type of exam you are having performed.   If you have any questions regarding your exam or if you need to reschedule, you may call [facility] at [phone] between the hours of 8:00 am and 5:00 pm, Monday-Friday.   Due to recent changes in healthcare laws, you may see the results of your imaging and laboratory studies on MyChart before your provider has had a chance to review them.  We understand that in some cases there may be results that are confusing or concerning to you. Not all laboratory results come back in the same time frame and the provider may be waiting for multiple results in order to interpret others.  Please give us  48  hours in order for your provider to thoroughly review all the results before contacting the office for clarification of your results.   Please do sitz baths- these can be found at the pharmacy. It is a Chief Operating Officer that is put in your toliet.  Please increase fiber or add benefiber, increase water and increase acitivity.  Will send in hydrocoritsone suppository, cheapest with GOODRX from [employer], [employer], [employer] or [employer] if your insurance does not pay for it. If the hemorrhoid suppository sent in is too expensive you can do this over the counter trick.  Apply a pea size amount of generic prescription Anusol HC cream that has been sent into your pharmacy to the tip of an over the counter PrepH suppository and insert rectally once every night for at least 7 nights.  If this does not improve there are procedures that can be done.   About Hemorrhoids  Hemorrhoids are swollen veins in the lower rectum and anus.  Also called piles, hemorrhoids are a common problem.  Hemorrhoids may be internal (inside the rectum) or external (around the anus).  Internal Hemorrhoids  Internal hemorrhoids are often painless, but they rarely cause bleeding.  The internal veins may stretch and fall down (prolapse) through the anus to the outside of the body.  The veins may then become irritated and painful.  External Hemorrhoids  External hemorrhoids can be easily seen or felt around the anal opening.  They are under the skin around  the anus.  When the swollen veins are scratched or broken by straining, rubbing or wiping they sometimes bleed.  How Hemorrhoids Occur  Veins in the rectum and around the anus tend to swell under pressure.  Hemorrhoids can result from increased pressure in the veins of your anus or rectum.  Some sources of pressure are:  Straining to have a bowel movement because of constipation Waiting too long to have a bowel movement Coughing and sneezing often Sitting for extended periods  of time, including on the toilet Diarrhea Obesity Trauma or injury to the anus Some liver diseases Stress Family history of hemorrhoids Pregnancy  Pregnant women should try to avoid becoming constipated, because they are more likely to have hemorrhoids during pregnancy.  In the last trimester of pregnancy, the enlarged uterus may press on blood vessels and causes hemorrhoids.  In addition, the strain of childbirth sometimes causes hemorrhoids after the birth.  Symptoms of Hemorrhoids  Some symptoms of hemorrhoids include: Swelling and/or a tender lump around the anus Itching, mild burning and bleeding around the anus Painful bowel movements with or without constipation Bright red blood covering the stool, on toilet paper or in the toilet bowel.   Symptoms usually go away within a few days.  Always talk to your doctor about any bleeding to make sure it is not from some other causes.  Diagnosing and Treating Hemorrhoids  Diagnosis is made by an examination by your healthcare provider.  Special test can be performed by your doctor.    Most cases of hemorrhoids can be treated with: High-fiber diet: Eat more high-fiber foods, which help prevent constipation.  Ask for more detailed fiber information on types and sources of fiber from your healthcare provider. Fluids: Drink plenty of water.  This helps soften bowel movements so they are easier to pass. Sitz baths and cold packs: Sitting in lukewarm water two or three times a day for 15 minutes cleases the anal area and may relieve discomfort.  If the water is too hot, swelling around the anus will get worse.  Placing a cloth-covered ice pack on the anus for ten minutes four times a day can also help reduce selling.  Gently pushing a prolapsed hemorrhoid back inside after the bath or ice pack can be helpful. Medications: For mild discomfort, your healthcare provider may suggest over-the-counter pain medication or prescribe a cream or ointment for  topical use.  The cream may contain witch hazel, zinc oxide or petroleum jelly.  Medicated suppositories are also a treatment option.  Always consult your doctor before applying medications or creams. Procedures and surgeries: There are also a number of procedures and surgeries to shrink or remove hemorrhoids in more serious cases.  Talk to your physician about these options.  You can often prevent hemorrhoids or keep them from becoming worse by maintaining a healthy lifestyle.  Eat a fiber-rich diet of fruits, vegetables and whole grains.  Also, drink plenty of water and exercise regularly.   2007, [employer] Doc.30   Please take your proton pump inhibitor medication, continue pepcid twice a day  Avoid spicy and acidic foods Avoid fatty foods Limit your intake of coffee, tea, alcohol, and carbonated drinks Work to maintain a healthy weight Keep the head of the bed elevated at least 3 inches with blocks or a wedge pillow if you are having any nighttime symptoms Stay upright for 2 hours after eating Avoid meals and snacks three to four hours before bedtime  Reflux Gourmet Rescue  It is an ALGINATE THERAPY which is the only intervention that works to safeguard the esophagus by creating a protective barrier that actually stops reflux from happening. -The general directions for use are as stated on the packaging: Take 1 teaspoon (5 ml), or more as needed or as directed by your physician, after meals and before bed. -These general directions address the most common times for reflux to occur, but our Rescue products may be taken anytime. Some individuals may take our product preemptively, when they know they will suffer from reflux, or as needed - when discomfort arises. (If taken around food, it should be consumed last.) -You do not have to take 1 teaspoon (5 ml) of the product. While one teaspoon (5ml) may be the perfect average amount to relieve reflux suffering in some, others may  require more or less. You may adjust the amount of Mint Chocolate Rescue and Vanilla Caramel Rescue to the lowest amount necessary to meet your individual needs to improve your quality of life. -You may dilute the product if it is too viscous for you to consume. Keep in mind, however, that the thickness of the product was formulated to provide optimal coating and protection of your throat and esophagus. Though diluting the product is possible, it may reduce the protective function and/or length of action. -This can be used in conjunction with reflux medications and lifestyle changes.  100% ALL-NATURAL  Paraben FREE, glycerin FREE, & potassium FREE  Made entirely from all-natural ingredients considered safe for children and during pregnancy  No known side effects  All-natural flavor Gluten FREE  Allergen FREE  Vegan  Can find more information here: [email or web address]

## 2023-08-29 ENCOUNTER — Encounter (HOSPITAL_BASED_OUTPATIENT_CLINIC_OR_DEPARTMENT_OTHER): Payer: Self-pay | Admitting: Cardiovascular Disease

## 2023-08-29 ENCOUNTER — Ambulatory Visit (INDEPENDENT_AMBULATORY_CARE_PROVIDER_SITE_OTHER): Payer: Medicare Other | Admitting: Cardiovascular Disease

## 2023-08-29 VITALS — BP 104/70 | HR 61 | Ht 67.0 in | Wt 193.4 lb

## 2023-08-29 DIAGNOSIS — I739 Peripheral vascular disease, unspecified: Secondary | ICD-10-CM | POA: Diagnosis not present

## 2023-08-29 DIAGNOSIS — I25119 Atherosclerotic heart disease of native coronary artery with unspecified angina pectoris: Secondary | ICD-10-CM

## 2023-08-29 DIAGNOSIS — Z860101 Personal history of adenomatous and serrated colon polyps: Secondary | ICD-10-CM

## 2023-08-29 DIAGNOSIS — I10 Essential (primary) hypertension: Secondary | ICD-10-CM | POA: Diagnosis not present

## 2023-08-29 DIAGNOSIS — I700 Atherosclerosis of aorta: Secondary | ICD-10-CM

## 2023-08-29 NOTE — Progress Notes (Signed)
 Cardiology Office Note:  .   Date:  08/29/2023  ID:  Pamela Lowe, DOB [birth year], MRN [id number] PCP: Benjiman Bras, MD  [facility] Providers Cardiologist:  Mickiel Albany, MD (Inactive)    History of Present Illness: .   Pamela Lowe is a 74 y.o. female with CAD s/p LAD PCI 12/2018, hypertension, hyperlipidemia, PAD, and Lynch syndrome who presents for follow-up.  Pamela Lowe underwent CTA and FFR 08/2018 that revealed significant stenosis in the LAD.  She subsequently underwent cardiac catheterization which revealed complete occlusion of the mid LAD 85% stenosis of D2, 50% stenosis of D1, and 65% stenosis of the mid RCA with 90% occlusion of the PDA.  She underwent PCI of the CTO in the LAD as well as the D2 lesion the following week.    She has a history of meningioma that was removed at [facility] in 01/2021. She last saw Dr. Felipe Horton 05/2021 and was doing well. She saw Pattricia Bores, NP 03/2022 and was concerned about bruising and bleeding. She underwent upper and lower endoscopy 12/2021, which were unremarkable. She was also struggling with nosebleeds. Hydrochlorothiazide  was discontinued by her PCP but subsequently restarted 12/2022 due to persistently elevated BP.  At her visit 12/2022 she was doing well from a cardiac standpoint.  Blood pressures were running low and HCTZ had been previously discontinued but then restarted when her blood pressure started to elevate.  She was working with the [facility].  Benazepril  was reduced to 10 mg.  Discussed the use of AI scribe software for clinical note transcription with the patient, who gave verbal consent to proceed.  History of Present Illness   Pamela Lowe notes that her blood pressure has stabilized with the current medication regimen, with readings usually around 120 mmHg, improved from the previous range of 130-140 mmHg. She attributes this improvement to recent changes in her medication. She is  currently taking metoprolol  and hydrochlorothiazide  for blood pressure management.  She is not exercising as much as she would like due to family issues but tries to walk with her dog and aims to ride her stationary bike for at least 30 minutes, five days a week. She is focused on losing weight, with a goal of losing 20 more pounds.  Her cholesterol levels have improved significantly, with her LDL dropping from 80 mg/dL to 17 mg/dL, which she attributes to the medication Repatha . She is currently on rosuvastatin  and Repatha  for cholesterol management. She has discontinued Zetia  as her cholesterol levels are well-controlled with her current regimen.  She is on aspirin  and Plavix  for her heart condition. She will be undergoing a procedure related to her Lynch syndrome, which requires the removal of her ovaries. She was referred to a specialist for this procedure due to her genetic testing results.  ROS:  As per HPI  Studies Reviewed: Aaron Aas   EKG Interpretation Date/Time:  Thursday August 29 2023 08:29:18 EDT Ventricular Rate:  61 PR Interval:  164 QRS Duration:  92 QT Interval:  416 QTC Calculation: 418 R Axis:   -55  Text Interpretation: Normal sinus rhythm Left anterior fascicular block Possible Anterolateral infarct (cited on or before 16-Jan-2023) When compared with ECG of 16-Jan-2023 09:21, No significant change was found Confirmed by Maudine Sos ([id number]) on 08/29/2023 8:37:04 AM   LHC 12/2018:   Successful stent implantation in the mid segment of the second diagonal reducing 95% stenosis to 0% with TIMI grade III flow  using a 2.0 x 18 Onyx postdilated the 2.25 mm in diameter.   Successful overlapping stent implantation in the mid to distal LAD using a 2.5 x 26 stent proximally overlapping a 22 x 2.5 mm stent more distally all postdilated to 2.75 mm.  Total occlusion with TIMI grade 0 flow was reduced to 0% with TIMI grade III flow.   RECOMMENDATIONS:   Continue aspirin  and clopidogrel   indefinitely Monitor symptoms for continued angina and if present perhaps the more proximal eccentric disease that involves the second diagonal may need therapy with PCI. Aggressive preventive therapy aiming to get LDL less than 55. Discharge in a.m. if no femoral artery complications or other issues overnight.  Risk Assessment/Calculations:             Physical Exam:   VS:  BP 104/70 (BP Location: Left Arm, Patient Position: Sitting, Cuff Size: Normal)   Pulse 61   Ht 5' 7 (1.702 m)   Wt 193 lb 6.4 oz (87.7 kg)   LMP 03/19/1998 Comment: h/o supracervical hysterectomy  SpO2 96%   BMI 30.29 kg/m  , BMI Body mass index is 30.29 kg/m. GENERAL:  Well appearing HEENT: Pupils equal round and reactive, fundi not visualized, oral mucosa unremarkable NECK:  No jugular venous distention, waveform within normal limits, carotid upstroke brisk and symmetric, no bruits, no thyromegaly LUNGS:  Clear to auscultation bilaterally HEART:  RRR.  PMI not displaced or sustained,S1 and S2 within normal limits, no S3, no S4, no clicks, no rubs, no murmurs ABD:  Flat, positive bowel sounds normal in frequency in pitch, no bruits, no rebound, no guarding, no midline pulsatile mass, no hepatomegaly, no splenomegaly EXT:  2 plus pulses throughout, no edema, no cyanosis no clubbing SKIN:  No rashes no nodules NEURO:  Cranial nerves II through XII grossly intact, motor grossly intact throughout PSYCH:  Cognitively intact, oriented to person place and time   ASSESSMENT AND PLAN: .    Assessment & Plan   #  Pre-operative risk assessment: Upcoming surgery for prophylactic ovary removal.  OK to hold aspirin  and Plavix  for 5 days if needed.  She is low risk for surgery as she can complete more than 4 METS of physical activity and exercises regularly.   # Coronary artery disease s/p PCI: Coronary artery disease is well-managed with dual antiplatelet therapy. Indefinite continuation recommended due to  overlapping stents.  - Hold aspirin  and Plavix  for upcoming procedure and resume post-procedure as advised by the surgical team. - Continue metoprolol  for heart disease management.  # Hypertension Hypertension is well-controlled with current medication regimen. Blood pressure consistently around 120 mmHg. - Continue current antihypertensive medications: metoprolol  and hydrochlorothiazide .  SHe is also on minoxidil for hair.  - Encourage regular physical activity, aiming for at least 30 minutes, five days a week.  # Hyperlipidemia Hyperlipidemia is well-managed. LDL cholesterol decreased to 17 mg/dL. Repatha  effective.  LDL goal <55.  - Continue rosuvastatin  and Repatha  for cholesterol management. - Discontinue Zetia .  # Lynch syndrome Referred for prophylactic oophorectomy as recommended by genetic testing. - Provide surgical clearance for prophylactic oophorectomy.      Dispo: f/u 1 year  Signed, Maudine Sos, MD

## 2023-08-29 NOTE — Progress Notes (Signed)
 Attending Physician's Attestation   I have reviewed the chart.   I agree with the Advanced Practitioner's note, impression, and recommendations with any updates as below. If continued issues, will recommend internal hemorrhoidal banding.   Yong Henle, MD [facility] Office # [phone]

## 2023-08-29 NOTE — Patient Instructions (Signed)
  Medication Instructions:   DISCONTINUE Zetia   *If you need a refill on your cardiac medications before your next appointment, please call your pharmacy*  Lab Work:  None ordered.  If you have labs (blood work) drawn today and your tests are completely normal, you will receive your results only by: MyChart Message (if you have MyChart) OR A paper copy in the mail If you have any lab test that is abnormal or we need to change your treatment, we will call you to review the results.  Testing/Procedures:  None ordered.  Follow-Up: At [facility] [place], you and your health needs are our priority.  As part of our continuing mission to provide you with exceptional heart care, our providers are all part of one team.  This team includes your primary Cardiologist (physician) and Advanced Practice Providers or APPs (Physician Assistants and Nurse Practitioners) who all work together to provide you with the care you need, when you need it.  Your next appointment:   1 year(s)  Provider:   Maudine Sos, MD    We recommend signing up for the patient portal called MyChart.  Sign up information is provided on this After Visit Summary.  MyChart is used to connect with patients for Virtual Visits (Telemedicine).  Patients are able to view lab/test results, encounter notes, upcoming appointments, etc.  Non-urgent messages can be sent to your provider as well.   To learn more about what you can do with MyChart, go to [email or web address].   Other Instructions  Your physician wants you to follow-up in: 1 year.  You will receive a reminder letter in the mail two months in advance. If you don't receive a letter, please call our office to schedule the follow-up appointment.       Ask your friend to call in a couple weeks to try and schedule.  If there are no openings, ask to speak with [facility].

## 2023-08-30 ENCOUNTER — Encounter (HOSPITAL_COMMUNITY): Admission: RE | Admit: 2023-08-30 | Source: Ambulatory Visit

## 2023-09-02 ENCOUNTER — Other Ambulatory Visit (HOSPITAL_COMMUNITY): Payer: Self-pay

## 2023-09-02 ENCOUNTER — Telehealth: Payer: Self-pay

## 2023-09-02 MED ORDER — HYDROCORTISONE (PERIANAL) 2.5 % EX CREA
1.0000 | TOPICAL_CREAM | Freq: Two times a day (BID) | CUTANEOUS | 2 refills | Status: DC
Start: 2023-09-02 — End: 2023-11-25

## 2023-09-02 NOTE — Telephone Encounter (Signed)
 Pharmacy Patient Advocate Encounter   Received notification from Patient Advice Request messages that prior authorization for Anucort-HC  25MG  suppositories is required/requested.   Insurance verification completed.   The patient is insured through [facility] Medicare Part D .   Per test claim: PA required; PA submitted to above mentioned insurance via CoverMyMeds Key/confirmation #/EOC [id number] Status is pending

## 2023-09-02 NOTE — Telephone Encounter (Signed)
 Pharmacy Patient Advocate Encounter  Received notification from [employer] Part D that Prior Authorization for Anucort-HC  25MG  suppositories has been DENIED.  Full denial letter will be uploaded to the media tab. See denial reason below.  Your requested medication belongs to a class of drugs called Less Than Effective Drug Efficacy Study Implementation (DESI) Drugs. DESI drugs are excluded from coverage under Medicare rules.   PA #/Case ID/Reference #: [id number]

## 2023-09-02 NOTE — Telephone Encounter (Signed)
 PA request has been Submitted. New Encounter has been or will be created for follow up. For additional info see Pharmacy Prior Auth telephone encounter from 09-02-2023.

## 2023-09-03 ENCOUNTER — Ambulatory Visit (INDEPENDENT_AMBULATORY_CARE_PROVIDER_SITE_OTHER): Admitting: *Deleted

## 2023-09-03 ENCOUNTER — Telehealth: Payer: Self-pay | Admitting: Cardiovascular Disease

## 2023-09-03 DIAGNOSIS — Z0000 Encounter for general adult medical examination without abnormal findings: Secondary | ICD-10-CM | POA: Diagnosis not present

## 2023-09-03 NOTE — Progress Notes (Signed)
 Subjective:   Pamela Lowe is a 73 y.o. female who presents for Medicare Annual (Subsequent) preventive examination.  Visit Complete: Virtual I connected with  Pamela Lowe on 09/03/23 by a audio enabled telemedicine application and verified that I am speaking with the correct person using two identifiers.  Patient Location: Home  Provider Location: Home Office  I discussed the limitations of evaluation and management by telemedicine. The patient expressed understanding and agreed to proceed.  Vital Signs: Because this visit was a virtual/telehealth visit, some criteria may be missing or patient reported. Any vitals not documented were not able to be obtained and vitals that have been documented are patient reported.  Patient Medicare AWV questionnaire was completed by the patient on 09-02-2023; I have confirmed that all information answered by patient is correct and no changes since this date.  Cardiac Risk Factors include: advanced age (>81men, >63 women);obesity (BMI >30kg/m2)     Objective:    There were no vitals filed for this visit. There is no height or weight on file to calculate BMI.     09/03/2023   11:37 AM 06/24/2023   10:19 AM 08/16/2022    9:07 AM 01/15/2022    6:28 AM 09/28/2021    1:59 PM 08/03/2021   11:21 AM 11/02/2019    5:07 PM  Advanced Directives  Does Patient Have a Medical Advance Directive? No No No No No No No  Would patient like information on creating a medical advance directive? No - Patient declined  No - Patient declined No - Patient declined No - Patient declined No - Patient declined     Current Medications (verified) Outpatient Encounter Medications as of 09/03/2023  Medication Sig   aspirin  81 MG EC tablet Take 81 mg by mouth every evening.    B Complex Vitamins (VITAMIN B COMPLEX) TABS Take 1 tablet by mouth daily.   betamethasone , augmented, (DIPROLENE ) 0.05 % lotion Apply topically.   brimonidine  (ALPHAGAN ) 0.2 % ophthalmic solution  Place 1 drop into both eyes 3 (three) times daily.    calcipotriene (DOVONOX) 0.005 % cream Apply 0.0005 Applications topically daily at 2 PM.   Calcium -Vitamin D -Vitamin K (CALCIUM  + D PO)    Cholecalciferol (VITAMIN D ) 50 MCG (2000 UT) tablet Take 2,000 Units by mouth daily.   clopidogrel  (PLAVIX ) 75 MG tablet Take 1 tablet (75 mg total) by mouth daily.   dorzolamide -timolol (COSOPT) 22.3-6.8 MG/ML ophthalmic solution Place 1 drop into the right eye 2 (two) times daily.   famotidine (PEPCID) 20 MG tablet Take 20 mg by mouth 2 (two) times daily.   folic acid  (FOLVITE ) 1 MG tablet Take 1 mg by mouth daily.    hydrochlorothiazide  (MICROZIDE ) 12.5 MG capsule TAKE 1 CAPSULE(12.5 MG) BY MOUTH DAILY   hydrocortisone  (ANUSOL -HC) 2.5 % rectal cream Place 1 Application rectally 2 (two) times daily.   leflunomide (ARAVA) 20 MG tablet Take 20 mg by mouth daily.   metoprolol  succinate (TOPROL -XL) 25 MG 24 hr tablet Take 1 tablet (25 mg total) by mouth daily.   minoxidil (LONITEN) 2.5 MG tablet Take 2.5 mg by mouth daily.   Multiple Vitamins-Minerals (MULTIVITAMIN WITH MINERALS) tablet Take 1 tablet by mouth daily.   Nutritional Supplements (VITAL HIGH PROTEIN PO) Take by mouth.   Omega-3 Fatty Acids (FISH OIL) 1000 MG CAPS Take 1 capsule by mouth daily.    REPATHA  SURECLICK 140 MG/ML SOAJ ADMINISTER 1 ML UNDER THE SKIN EVERY 14 DAYS   rosuvastatin  (CRESTOR ) 20 MG  tablet Take 20 mg by mouth daily.   SIMPONI  ARIA 50 MG/4ML SOLN injection Inject 50 mg into the vein. Every 60 Days   VYZULTA 0.024 % SOLN Apply to eye. Right eye   No facility-administered encounter medications on file as of 09/03/2023.    Allergies (verified) Sulfamethoxazole-trimethoprim, Conjugated estrogens, and Wellbutrin [bupropion hcl]   History: Past Medical History:  Diagnosis Date   Allergic rhinitis    Allergy    Arthritis    Brain tumor (HCC)    Breast cyst 2007   Right   Cataract    surgery 07/2017   Clotting disorder  (HCC)    On plavix  since stents  for artery blockages   Depression 2006   w/ menopause   Depression screening 09/27/2021   Family history of adverse reaction to anesthesia    sister PONV   Family history of breast cancer    Family history of colon cancer    Family history of kidney cancer 01/15/2018   Family history of lung cancer    Family history of pancreatic cancer    Family history of prostate cancer    Family history of uterine cancer    GERD (gastroesophageal reflux disease)    Glaucoma    Heart murmur    History of hysterectomy, supracervical    Hyperlipidemia    Hypertension    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   MLH1 [id number] (Splice site)   Multinodular goiter    PAD (peripheral artery disease) (HCC)    LE Art US  1/18: Occluded prox-mid R SFA // ABIs 9/22: R 0.83; L 1.0   Pustular psoriasis    Tobacco user    Past Surgical History:  Procedure Laterality Date   BIOPSY  01/15/2022   Procedure: BIOPSY;  Surgeon: Normie Becton., MD;  Location: [facility];  Service: Gastroenterology;;   BONE BIOPSY  06/24/2023   Procedure: BIOPSY;  Surgeon: Normie Becton., MD;  Location: [facility];  Service: Gastroenterology;;   BRAIN TUMOR EXCISION  02/24/2021   at [facility]   BREAST BIOPSY Bilateral 1985   CARDIAC CATHETERIZATION     CATARACT EXTRACTION Right 07/2017   COLONOSCOPY     COLONOSCOPY WITH PROPOFOL  N/A 01/15/2022   Procedure: COLONOSCOPY WITH PROPOFOL ;  Surgeon: Normie Becton., MD;  Location: [facility];  Service: Gastroenterology;  Laterality: N/A;   COLONOSCOPY WITH PROPOFOL  N/A 06/24/2023   Procedure: COLONOSCOPY WITH PROPOFOL ;  Surgeon: Mansouraty, Albino Alu., MD;  Location: [facility];  Service: Gastroenterology;  Laterality: N/A;   CORONARY STENT INTERVENTION N/A 12/26/2018   Procedure: CORONARY STENT INTERVENTION;  Surgeon: Arty Binning, MD;  Location: [facility];  Service: Cardiovascular;  Laterality: N/A;    ESOPHAGOGASTRODUODENOSCOPY (EGD) WITH PROPOFOL  N/A 01/15/2022   Procedure: ESOPHAGOGASTRODUODENOSCOPY (EGD) WITH PROPOFOL ;  Surgeon: Brice Campi Albino Alu., MD;  Location: [facility];  Service: Gastroenterology;  Laterality: N/A;   ESOPHAGOGASTRODUODENOSCOPY (EGD) WITH PROPOFOL  N/A 06/24/2023   Procedure: ESOPHAGOGASTRODUODENOSCOPY (EGD) WITH PROPOFOL ;  Surgeon: Brice Campi Albino Alu., MD;  Location: [facility];  Service: Gastroenterology;  Laterality: N/A;   EYE SURGERY  2019, 2020   Cataracts, eye shunt implants   LEFT HEART CATH AND CORONARY ANGIOGRAPHY N/A 12/17/2018   Procedure: LEFT HEART CATH AND CORONARY ANGIOGRAPHY;  Surgeon: Arty Binning, MD;  Location: [facility];  Service: Cardiovascular;  Laterality: N/A;   POLYPECTOMY  01/15/2022   Procedure: POLYPECTOMY;  Surgeon: Brice Campi Albino Alu., MD;  Location: [facility];  Service: Gastroenterology;;   POLYPECTOMY  06/24/2023   Procedure: POLYPECTOMY, INTESTINE;  Surgeon: Brice Campi Albino Alu., MD;  Location: [facility];  Service: Gastroenterology;;   SUPRACERVICAL ABDOMINAL HYSTERECTOMY  1986   UPPER GASTROINTESTINAL ENDOSCOPY     Family History  Problem Relation Age of Onset   Hypertension Mother    Deep vein thrombosis Mother    Alzheimer's disease Mother    Osteopenia Mother    Arthritis Mother    Varicose Veins Mother    Kidney cancer Father 60   Pneumonia Father    Hypertension Father    Arthritis Father    Early death Father    Kidney disease Father    Breast cancer Sister 32       recurrence 75   Cancer Sister    Breast cancer Sister 42       Stage 0   Cancer Sister    Breast cancer Sister 79       Stage 3, bilateral mastectomy   Cancer Sister    Early death Sister    Deep vein thrombosis Sister            Allergies Brother    Early death Brother    Heart attack Brother 68   Breast cancer Maternal Aunt 67   Prostate cancer Maternal Uncle 68   Lung cancer Maternal Uncle 60    Pancreatic cancer Paternal Aunt 60   Pancreatic cancer Paternal Aunt 8   Brain cancer Paternal Aunt    Pancreatic cancer Paternal Uncle 47   Lung cancer Paternal Uncle 70   Heart attack Maternal Grandfather    Uterine cancer Paternal Grandmother 61   Colon cancer Paternal Grandfather 50   Kidney cancer Cousin 6   Kidney cancer Cousin 52   Uterine cancer Cousin 62   Prostate cancer Cousin 57   Breast cancer Cousin 60   Thyroid  disease Neg Hx    Rectal cancer Neg Hx    Stomach cancer Neg Hx    Esophageal cancer Neg Hx    Inflammatory bowel disease Neg Hx    Liver disease Neg Hx    Social History   Socioeconomic History   Marital status: Married    Spouse name: Cortland Ding   Number of children: 0   Years of education: Not on file   Highest education level: Master's degree (e.g., MA, MS, MEng, MEd, MSW, MBA)  Occupational History   Occupation: Special Ed Runner, broadcasting/film/video    Comment: Dentist  Tobacco Use   Smoking status: Former    Current packs/day: 0.00    Average packs/day: 0.3 packs/day for 36.0 years (9.0 ttl pk-yrs)    Types: Cigarettes    Start date: 08/01/1981    Quit date: 08/01/2017    Years since quitting: 6.0   Smokeless tobacco: Never   Tobacco comments:    Started smoking at about 73 yrs old  Vaping Use   Vaping status: Never Used  Substance and Sexual Activity   Alcohol use: Not Currently   Drug use: No   Sexual activity: Not Currently    Partners: Male    Birth control/protection: Post-menopausal, Surgical    Comment: Hysterectomy  Other Topics Concern   Not on file  Social History Narrative   Not on file   Social Drivers of Health   Financial Resource Strain: Medium Risk (09/03/2023)   Overall Financial Resource Strain (CARDIA)    Difficulty of Paying Living Expenses: Somewhat hard  Food Insecurity: No Food Insecurity (09/03/2023)   Hunger Vital Sign  Worried About Programme researcher, broadcasting/film/video in the Last Year: Never true    Ran Out of Food in the Last  Year: Never true  Transportation Needs: Unmet Transportation Needs (09/03/2023)   PRAPARE - Administrator, Civil Service (Medical): Yes    Lack of Transportation (Non-Medical): No  Physical Activity: Insufficiently Active (09/03/2023)   Exercise Vital Sign    Days of Exercise per Week: 4 days    Minutes of Exercise per Session: 30 min  Stress: No Stress Concern Present (09/03/2023)   [employer] of Occupational Health - Occupational Stress Questionnaire    Feeling of Stress: Only a little  Social Connections: Moderately Integrated (09/03/2023)   Social Connection and Isolation Panel    Frequency of Communication with Friends and Family: More than three times a week    Frequency of Social Gatherings with Friends and Family: Three times a week    Attends Religious Services: More than 4 times per year    Active Member of Clubs or Organizations: No    Attends Banker Meetings: Never    Marital Status: Married    Tobacco Counseling Counseling given: Not Answered Tobacco comments: Started smoking at about 73 yrs old   Clinical Intake:  Pre-visit preparation completed: Yes  Pain : No/denies pain     Diabetes: No  How often do you need to have someone help you when you read instructions, pamphlets, or other written materials from your doctor or pharmacy?: 1 - Never  Interpreter Needed?: No  Information entered by :: Kieth Pelt LPN   Activities of Daily Living    09/03/2023   11:42 AM 09/02/2023    9:02 AM  In your present state of health, do you have any difficulty performing the following activities:  Hearing? 0 0  Vision? 0 0  Difficulty concentrating or making decisions? 0 0  Walking or climbing stairs? 1 1  Dressing or bathing? 0 0  Doing errands, shopping? 0 0  Preparing Food and eating ? N N  Using the Toilet? N N  In the past six months, have you accidently leaked urine? Y Y  Do you have problems with loss of bowel control? N N   Managing your Medications? N N  Managing your Finances? N N  Housekeeping or managing your Housekeeping? N N    Patient Care Team: Benjiman Bras, MD as PCP - General (Family Medicine) Arty Binning, MD (Inactive) as PCP - Cardiology (Cardiology) Arty Binning, MD (Inactive) as Consulting Physician (Cardiology) Alanda Allegra, MD as Referring Physician (Ophthalmology) Illona Malone Karlyn Overman, MD as Referring Physician (Ophthalmology) Godwin Lat, Sherida Dimmer, MD (Neurology) Janel Medford, MD (Inactive) as Attending Physician (Gastroenterology) Florance Hun, MD as Consulting Physician (Ophthalmology) Alvester Aw, MD (Rheumatology) Tasia Farr, MD as Referring Physician (Endocrinology) Lillian Rein, MD as Consulting Physician (Obstetrics and Gynecology) Thresa Floor, Primus Brookes, MD as Referring Physician (Otolaryngology) Zomorodi, Ali, MD (Neurosurgery)  Indicate any recent Medical Services you may have received from other than [facility] providers in the past year (date may be approximate).     Assessment:   This is a routine wellness examination for Pamela Lowe.  Hearing/Vision screen Hearing Screening - Comments:: No trouble hearing Vision Screening - Comments:: Up to date Pamela Lowe / Pamela Lowe   Goals Addressed             This Visit's Progress    Weight (lb) < 200 lb (90.7 kg)  Depression Screen    09/03/2023   11:44 AM 07/10/2023    1:35 PM 04/10/2023    9:54 AM 04/03/2023    2:10 PM 02/11/2023    1:46 PM 10/08/2022    8:25 AM 08/16/2022    9:13 AM  PHQ 2/9 Scores  PHQ - 2 Score 0 0 0 0 0 0 0  PHQ- 9 Score 1 0 0   0 0    Fall Risk    09/03/2023   11:42 AM 09/02/2023    9:02 AM 07/10/2023    1:35 PM 04/10/2023    9:54 AM 02/11/2023    1:46 PM  Fall Risk   Falls in the past year? 0 0 0 0 0  Number falls in past yr: 0  0 0 0  Injury with Fall? 0  0 0 0  Risk for fall due to :   No Fall Risks No Fall Risks No Fall Risks  Follow up Falls evaluation  completed;Education provided;Falls prevention discussed  Falls evaluation completed Falls evaluation completed Falls evaluation completed    MEDICARE RISK AT HOME: Medicare Risk at Home Any stairs in or around the home?: Yes If so, are there any without handrails?: Yes Home free of loose throw rugs in walkways, pet beds, electrical cords, etc?: Yes Adequate lighting in your home to reduce risk of falls?: Yes Life alert?: No Use of a cane, walker or w/c?: No Grab bars in the bathroom?: Yes Shower chair or bench in shower?: No Elevated toilet seat or a handicapped toilet?: Yes  TIMED UP AND GO:  Was the test performed?  No    Cognitive Function:      01/17/2023   10:48 AM 08/10/2021   10:59 AM 09/21/2016   10:41 AM  Montreal Cognitive Assessment   Visuospatial/ Executive (0/5) 5 4 5   Naming (0/3) 3 3 3   Attention: Read list of digits (0/2) 2 1 2   Attention: Read list of letters (0/1) 1 1 1   Attention: Serial 7 subtraction starting at 100 (0/3) 3 2 3   Language: Repeat phrase (0/2) 2 2 2   Language : Fluency (0/1) 0 1 1  Abstraction (0/2) 2 2 2   Delayed Recall (0/5) 4 2 5   Orientation (0/6) 6 5 6   Total 28 23 30   Adjusted Score (based on education) 28        09/03/2023   11:43 AM 08/16/2022    9:10 AM  6CIT Screen  What Year? 0 Lowe 0 Lowe  What month? 0 Lowe 0 Lowe  What time? 0 Lowe 0 Lowe  Count back from 20 0 Lowe 0 Lowe  Months in reverse 0 Lowe 0 Lowe  Repeat phrase 0 Lowe 0 Lowe  Total Score 0 Lowe 0 Lowe    Immunizations Immunization History  Administered Date(s) Administered   Fluad Quad(high Dose 65+) 12/08/2021   Fluad Trivalent(High Dose 65+) 01/10/2023   Influenza, High Dose Seasonal PF 01/08/2020   Influenza-Unspecified 12/13/2017, 12/05/2020   PFIZER Comirnaty(Gray Top)Covid-19 Tri-Sucrose Vaccine 08/01/2020   PFIZER(Purple Top)SARS-COV-2 Vaccination 04/24/2019, 05/19/2019, 12/01/2019   Pfizer Covid-19 Vaccine Bivalent  Booster 22yrs & up 01/27/2021   Pneumococcal Conjugate-13 01/08/2020   Pneumococcal-Unspecified 01/08/2020   Tdap 03/19/2006, 11/21/2018   Zoster Recombinant(Shingrix) 12/03/2018, 02/06/2019    TDAP status: Up to date  Flu Vaccine status: Up to date  Pneumococcal vaccine status: Up to date  Covid-19 vaccine status: Information provided on how to obtain vaccines.   Qualifies for Shingles Vaccine? No  Zostavax completed Yes   Shingrix Completed?: Yes  Screening Tests Health Maintenance  Topic Date Due   INFLUENZA VACCINE  10/18/2023   Colonoscopy  06/23/2024   Medicare Annual Wellness (AWV)  09/02/2024   MAMMOGRAM  06/06/2025   DTaP/Tdap/Td (3 - Td or Tdap) 11/20/2028   DEXA SCAN  Completed   Hepatitis C Screening  Completed   Zoster Vaccines- Shingrix  Completed   HPV VACCINES  Aged Out   Meningococcal B Vaccine  Aged Out   Pneumococcal Vaccine: 50+ Years  Discontinued   COVID-19 Vaccine  Discontinued    Health Maintenance  There are no preventive care reminders to display for this patient.   Colorectal cancer screening: Type of screening: Colonoscopy. Completed 2025. Repeat every 1 years  Mammogram status: Completed  . Repeat every year  Bone Density status: Completed 2021. Results reflect: Bone density results: NORMAL. Repeat every Educaiton provided years.  Lung Cancer Screening: (Low Dose CT Chest recommended if Age 7-80 years, 20 pack-year currently smoking OR have quit w/in 15years.) does qualify.   Lung Cancer Screening Referral: scheduled 09-05-2023  Additional Screening:  Hepatitis C Screening: does not qualify; Completed 2024  Vision Screening: Recommended annual ophthalmology exams for early detection of glaucoma and other disorders of the eye. Is the patient up to date with their annual eye exam?  Yes  Who is the provider or what is the name of the office in which the patient attends annual eye exams? Pamela Lowe If pt is not established with a  provider, would they like to be referred to a provider to establish care? No .   Dental Screening: Recommended annual dental exams for proper oral hygiene   Community Resource Referral / Chronic Care Management: CRR required this visit?  No   CCM required this visit?  No     Plan:     I have personally reviewed and noted the following in the patient's chart:   Medical and social history Use of alcohol, tobacco or illicit drugs  Current medications and supplements including opioid prescriptions. Patient is not currently taking opioid prescriptions. Functional ability and status Nutritional status Physical activity Advanced directives List of other physicians Hospitalizations, surgeries, and ER visits in previous 12 months Vitals Screenings to include cognitive, depression, and falls Referrals and appointments  In addition, I have reviewed and discussed with patient certain preventive protocols, quality metrics, and best practice recommendations. A written personalized care plan for preventive services as well as general preventive health recommendations were provided to patient.     Kieth Pelt, LPN   06/25/8117   After Visit Summary: (MyChart) Due to this being a telephonic visit, the after visit summary with patients personalized plan was offered to patient via MyChart   Nurse Notes:

## 2023-09-03 NOTE — Telephone Encounter (Signed)
 Follow Up:      April is calling to check on the status of this patient's clearance. She is scheduled for surgery on Thursday(09-05-23).

## 2023-09-03 NOTE — Patient Instructions (Signed)
 Pamela Lowe , Thank you for taking time to come for your Medicare Wellness Visit. I appreciate your ongoing commitment to your health goals. Please review the following plan we discussed and let me know if I can assist you in the future.   Screening recommendations/referrals: Colonoscopy: up to date Mammogram: up to date Bone Density: Education provided Recommended yearly ophthalmology/optometry visit for glaucoma screening and checkup Recommended yearly dental visit for hygiene and checkup  Vaccinations: Influenza vaccine: up to date Pneumococcal vaccine: up to date Tdap vaccine: up to date Shingles vaccine: up to date        Preventive Care 65 Years and Older, Female Preventive care refers to lifestyle choices and visits with your health care provider that can promote health and wellness. What does preventive care include? A yearly physical exam. This is also called an annual well check. Dental exams once or twice a year. Routine eye exams. Ask your health care provider how often you should have your eyes checked. Personal lifestyle choices, including: Daily care of your teeth and gums. Regular physical activity. Eating a healthy diet. Avoiding tobacco and drug use. Limiting alcohol use. Practicing safe sex. Taking low-dose aspirin  every day. Taking vitamin and mineral supplements as recommended by your health care provider. What happens during an annual well check? The services and screenings done by your health care provider during your annual well check will depend on your age, overall health, lifestyle risk factors, and family history of disease. Counseling  Your health care provider may ask you questions about your: Alcohol use. Tobacco use. Drug use. Emotional well-being. Home and relationship well-being. Sexual activity. Eating habits. History of falls. Memory and ability to understand (cognition). Work and work Astronomer. Reproductive health. Screening   You may have the following tests or measurements: Height, weight, and BMI. Blood pressure. Lipid and cholesterol levels. These may be checked every 5 years, or more frequently if you are over 3 years old. Skin check. Lung cancer screening. You may have this screening every year starting at age 72 if you have a 30-pack-year history of smoking and currently smoke or have quit within the past 15 years. Fecal occult blood test (FOBT) of the stool. You may have this test every year starting at age 64. Flexible sigmoidoscopy or colonoscopy. You may have a sigmoidoscopy every 5 years or a colonoscopy every 10 years starting at age 64. Hepatitis C blood test. Hepatitis B blood test. Sexually transmitted disease (STD) testing. Diabetes screening. This is done by checking your blood sugar (glucose) after you have not eaten for a while (fasting). You may have this done every 1-3 years. Bone density scan. This is done to screen for osteoporosis. You may have this done starting at age 39. Mammogram. This may be done every 1-2 years. Talk to your health care provider about how often you should have regular mammograms. Talk with your health care provider about your test results, treatment options, and if necessary, the need for more tests. Vaccines  Your health care provider may recommend certain vaccines, such as: Influenza vaccine. This is recommended every year. Tetanus, diphtheria, and acellular pertussis (Tdap, Td) vaccine. You may need a Td booster every 10 years. Zoster vaccine. You may need this after age 24. Pneumococcal 13-valent conjugate (PCV13) vaccine. One dose is recommended after age 60. Pneumococcal polysaccharide (PPSV23) vaccine. One dose is recommended after age 24. Talk to your health care provider about which screenings and vaccines you need and how often you need them.  This information is not intended to replace advice given to you by your health care provider. Make sure you discuss  any questions you have with your health care provider. Document Released: 04/01/2015 Document Revised: 11/23/2015 Document Reviewed: 01/04/2015 Elsevier Interactive Patient Education  2017 [employer].  Fall Prevention in the Home Falls can cause injuries. They can happen to people of all ages. There are many things you can do to make your home safe and to help prevent falls. What can I do on the outside of my home? Regularly fix the edges of walkways and driveways and fix any cracks. Remove anything that might make you trip as you walk through a door, such as a raised step or threshold. Trim any bushes or trees on the path to your home. Use bright outdoor lighting. Clear any walking paths of anything that might make someone trip, such as rocks or tools. Regularly check to see if handrails are loose or broken. Make sure that both sides of any steps have handrails. Any raised decks and porches should have guardrails on the edges. Have any leaves, snow, or ice cleared regularly. Use sand or salt on walking paths during winter. Clean up any spills in your garage right away. This includes oil or grease spills. What can I do in the bathroom? Use night lights. Install grab bars by the toilet and in the tub and shower. Do not use towel bars as grab bars. Use non-skid mats or decals in the tub or shower. If you need to sit down in the shower, use a plastic, non-slip stool. Keep the floor dry. Clean up any water that spills on the floor as soon as it happens. Remove soap buildup in the tub or shower regularly. Attach bath mats securely with double-sided non-slip rug tape. Do not have throw rugs and other things on the floor that can make you trip. What can I do in the bedroom? Use night lights. Make sure that you have a light by your bed that is easy to reach. Do not use any sheets or blankets that are too big for your bed. They should not hang down onto the floor. Have a firm chair that has  side arms. You can use this for support while you get dressed. Do not have throw rugs and other things on the floor that can make you trip. What can I do in the kitchen? Clean up any spills right away. Avoid walking on wet floors. Keep items that you use a lot in easy-to-reach places. If you need to reach something above you, use a strong step stool that has a grab bar. Keep electrical cords out of the way. Do not use floor polish or wax that makes floors slippery. If you must use wax, use non-skid floor wax. Do not have throw rugs and other things on the floor that can make you trip. What can I do with my stairs? Do not leave any items on the stairs. Make sure that there are handrails on both sides of the stairs and use them. Fix handrails that are broken or loose. Make sure that handrails are as long as the stairways. Check any carpeting to make sure that it is firmly attached to the stairs. Fix any carpet that is loose or worn. Avoid having throw rugs at the top or bottom of the stairs. If you do have throw rugs, attach them to the floor with carpet tape. Make sure that you have a light switch at the  top of the stairs and the bottom of the stairs. If you do not have them, ask someone to add them for you. What else can I do to help prevent falls? Wear shoes that: Do not have high heels. Have rubber bottoms. Are comfortable and fit you well. Are closed at the toe. Do not wear sandals. If you use a stepladder: Make sure that it is fully opened. Do not climb a closed stepladder. Make sure that both sides of the stepladder are locked into place. Ask someone to hold it for you, if possible. Clearly mark and make sure that you can see: Any grab bars or handrails. First and last steps. Where the edge of each step is. Use tools that help you move around (mobility aids) if they are needed. These include: Canes. Walkers. Scooters. Crutches. Turn on the lights when you go into a dark area.  Replace any light bulbs as soon as they burn out. Set up your furniture so you have a clear path. Avoid moving your furniture around. If any of your floors are uneven, fix them. If there are any pets around you, be aware of where they are. Review your medicines with your doctor. Some medicines can make you feel dizzy. This can increase your chance of falling. Ask your doctor what other things that you can do to help prevent falls. This information is not intended to replace advice given to you by your health care provider. Make sure you discuss any questions you have with your health care provider. Document Released: 12/30/2008 Document Revised: 08/11/2015 Document Reviewed: 04/09/2014 Elsevier Interactive Patient Education  2017 [employer].

## 2023-09-03 NOTE — Telephone Encounter (Signed)
 I have returned April's phone and sent over there fax of Dr. Gerlene Koh office note. She thanked me for the call.

## 2023-09-04 ENCOUNTER — Other Ambulatory Visit (HOSPITAL_COMMUNITY): Payer: Self-pay

## 2023-09-04 ENCOUNTER — Telehealth: Payer: Self-pay

## 2023-09-04 NOTE — Telephone Encounter (Signed)
 Office called back to say they haven't received the clearance. Fax # [phone]. Please advise

## 2023-09-04 NOTE — Telephone Encounter (Signed)
 Pharmacy Patient Advocate Encounter   Received notification from CoverMyMeds that prior authorization for Hydrocortisone  2.5% cream is required/requested.   Insurance verification completed.   The patient is insured through [facility] Medicare Part D .   Per test claim: The current 10 day co-pay is, $1.33.  No PA needed at this time. This test claim was processed through [facility]- copay amounts may vary at other pharmacies due to pharmacy/plan contracts, or as the patient moves through the different stages of their insurance plan.

## 2023-09-04 NOTE — Telephone Encounter (Signed)
 I s/w April surgery scheduler for Dr. Orvil Bland. I did state that we have faxed over x 2 the clearance notes from Dr. Theodis Fiscal.   I also did read the section of the ov notes from Dr. Theodis Fiscal that she provides clearance and recommendations. As well as we are in the [facility] system. April was able to pull up the ov notes from Dr. Theodis Fiscal and I directed her to the A&P section of the note to find the clearance and recommendations.   April thanked me for the help and she printed the notes for Dr. Orvil Bland.

## 2023-09-04 NOTE — Telephone Encounter (Signed)
 I called the pt to keep her in the loop that the surgeon office  keeps saying they have not received the clearance notes from our office. I have assured the pt that we have sent the clearance notes x 2. I will be happy to re-fax the notes on her behalf. I did also tell the pt that they are in the [facility] system which means they should be able to see the notes. I will however re-fax notes. Pt tells me to address the fax to [facility]. Pt thanked me for my call to keep her in th loop as well and the help.    Assessment & Plan     #  Pre-operative risk assessment: Upcoming surgery for prophylactic ovary removal.  OK to hold aspirin  and Plavix  for 5 days if needed.  She is low risk for surgery as she can complete more than 4 METS of physical activity and exercises regularly.

## 2023-09-05 ENCOUNTER — Ambulatory Visit (HOSPITAL_BASED_OUTPATIENT_CLINIC_OR_DEPARTMENT_OTHER)
Admission: RE | Admit: 2023-09-05 | Discharge: 2023-09-05 | Disposition: A | Discharge status: Home / Self Care | Source: Ambulatory Visit | Attending: Acute Care | Admitting: Acute Care

## 2023-09-05 ENCOUNTER — Ambulatory Visit (HOSPITAL_BASED_OUTPATIENT_CLINIC_OR_DEPARTMENT_OTHER)
Admission: RE | Admit: 2023-09-05 | Discharge: 2023-09-05 | Disposition: A | Discharge status: Home / Self Care | Source: Ambulatory Visit | Attending: Physician Assistant | Admitting: Physician Assistant

## 2023-09-05 ENCOUNTER — Encounter (HOSPITAL_BASED_OUTPATIENT_CLINIC_OR_DEPARTMENT_OTHER): Payer: Self-pay

## 2023-09-05 DIAGNOSIS — Z122 Encounter for screening for malignant neoplasm of respiratory organs: Secondary | ICD-10-CM | POA: Insufficient documentation

## 2023-09-05 DIAGNOSIS — K5730 Diverticulosis of large intestine without perforation or abscess without bleeding: Secondary | ICD-10-CM | POA: Diagnosis not present

## 2023-09-05 DIAGNOSIS — R1032 Left lower quadrant pain: Secondary | ICD-10-CM | POA: Diagnosis not present

## 2023-09-05 DIAGNOSIS — N281 Cyst of kidney, acquired: Secondary | ICD-10-CM | POA: Diagnosis not present

## 2023-09-05 DIAGNOSIS — Z87891 Personal history of nicotine dependence: Secondary | ICD-10-CM | POA: Diagnosis not present

## 2023-09-05 DIAGNOSIS — R1084 Generalized abdominal pain: Secondary | ICD-10-CM | POA: Insufficient documentation

## 2023-09-05 DIAGNOSIS — Z1509 Genetic susceptibility to other malignant neoplasm: Secondary | ICD-10-CM

## 2023-09-05 DIAGNOSIS — N200 Calculus of kidney: Secondary | ICD-10-CM | POA: Diagnosis not present

## 2023-09-05 MED ORDER — IOHEXOL 350 MG/ML SOLN
100.0000 mL | Freq: Once | INTRAVENOUS | Status: AC | PRN
  Administered 2023-09-05: 100 mL via INTRAVENOUS

## 2023-09-05 NOTE — Progress Notes (Signed)
 COVID Vaccine Completed: yes  Date of COVID positive in last 90 days:  PCP - Caro Christmas, MD Cardiologist - Kay Parson, MD  Chest x-ray -  EKG - 08/29/23 Epic Stress Test - 12/08/15 Epic ECHO - 05/27/18 Epic Cardiac Cath - 12/17/18 Epic Pacemaker/ICD device last checked: Spinal Cord Stimulator:  Bowel Prep -   Sleep Study -  CPAP -   Fasting Blood Sugar -  Checks Blood Sugar _____ times a day  Last dose of GLP1 agonist-  N/A GLP1 instructions:  Do not take after     Last dose of SGLT-2 inhibitors-  N/A SGLT-2 instructions:  Do not take after     Blood Thinner Instructions:  Plavix  Aspirin  Instructions: ASA 81 Last Dose:  Activity level:  Can go up a flight of stairs and perform activities of daily living without stopping and without symptoms of chest pain or shortness of breath.  Able to exercise without symptoms  Unable to go up a flight of stairs without symptoms of     Anesthesia review: HTN, CAD, PAD, aortic atherosclerosis,   Patient denies shortness of breath, fever, cough and chest pain at PAT appointment  Patient verbalized understanding of instructions that were given to them at the PAT appointment. Patient was also instructed that they will need to review over the PAT instructions again at home before surgery.

## 2023-09-06 ENCOUNTER — Encounter (HOSPITAL_COMMUNITY): Payer: Self-pay

## 2023-09-06 ENCOUNTER — Telehealth: Payer: Self-pay | Admitting: *Deleted

## 2023-09-06 ENCOUNTER — Other Ambulatory Visit: Payer: Self-pay

## 2023-09-06 ENCOUNTER — Encounter (HOSPITAL_COMMUNITY)
Admission: RE | Admit: 2023-09-06 | Discharge: 2023-09-06 | Disposition: A | Discharge status: Home / Self Care | Source: Ambulatory Visit | Attending: Gynecologic Oncology | Admitting: Gynecologic Oncology

## 2023-09-06 ENCOUNTER — Telehealth: Payer: Self-pay

## 2023-09-06 DIAGNOSIS — Z01812 Encounter for preprocedural laboratory examination: Secondary | ICD-10-CM | POA: Diagnosis not present

## 2023-09-06 DIAGNOSIS — Z1509 Genetic susceptibility to other malignant neoplasm: Secondary | ICD-10-CM | POA: Insufficient documentation

## 2023-09-06 DIAGNOSIS — K219 Gastro-esophageal reflux disease without esophagitis: Secondary | ICD-10-CM | POA: Insufficient documentation

## 2023-09-06 DIAGNOSIS — I129 Hypertensive chronic kidney disease with stage 1 through stage 4 chronic kidney disease, or unspecified chronic kidney disease: Secondary | ICD-10-CM | POA: Insufficient documentation

## 2023-09-06 DIAGNOSIS — I739 Peripheral vascular disease, unspecified: Secondary | ICD-10-CM | POA: Insufficient documentation

## 2023-09-06 DIAGNOSIS — N183 Chronic kidney disease, stage 3 unspecified: Secondary | ICD-10-CM | POA: Diagnosis not present

## 2023-09-06 DIAGNOSIS — I2510 Atherosclerotic heart disease of native coronary artery without angina pectoris: Secondary | ICD-10-CM | POA: Diagnosis not present

## 2023-09-06 DIAGNOSIS — Z87891 Personal history of nicotine dependence: Secondary | ICD-10-CM | POA: Diagnosis not present

## 2023-09-06 HISTORY — DX: Atherosclerotic heart disease of native coronary artery without angina pectoris: I25.10

## 2023-09-06 LAB — COMPREHENSIVE METABOLIC PANEL WITH GFR
ALT: 47 U/L — ABNORMAL HIGH (ref 0–44)
AST: 53 U/L — ABNORMAL HIGH (ref 15–41)
Albumin: 3.7 g/dL (ref 3.5–5.0)
Alkaline Phosphatase: 49 U/L (ref 38–126)
Anion gap: 10 (ref 5–15)
BUN: 18 mg/dL (ref 8–23)
CO2: 26 mmol/L (ref 22–32)
Calcium: 9.4 mg/dL (ref 8.9–10.3)
Chloride: 104 mmol/L (ref 98–111)
Creatinine, Ser: 1.02 mg/dL — ABNORMAL HIGH (ref 0.44–1.00)
GFR, Estimated: 58 mL/min — ABNORMAL LOW (ref >60)
Glucose, Bld: 96 mg/dL (ref 70–99)
Potassium: 3.9 mmol/L (ref 3.5–5.1)
Sodium: 140 mmol/L (ref 135–145)
Total Bilirubin: 1.5 mg/dL — ABNORMAL HIGH (ref 0.0–1.2)
Total Protein: 6.8 g/dL (ref 6.5–8.1)

## 2023-09-06 LAB — CBC
HCT: 44.1 % (ref 36.0–46.0)
Hemoglobin: 13.6 g/dL (ref 12.0–15.0)
MCH: 28.6 pg (ref 26.0–34.0)
MCHC: 30.8 g/dL (ref 30.0–36.0)
MCV: 92.8 fL (ref 80.0–100.0)
Platelets: 211 10*3/uL (ref 150–400)
RBC: 4.75 MIL/uL (ref 3.87–5.11)
RDW: 13.4 % (ref 11.5–15.5)
WBC: 4.7 10*3/uL (ref 4.0–10.5)
nRBC: 0 % (ref 0.0–0.2)

## 2023-09-06 LAB — TYPE AND SCREEN
ABO/RH(D): A POS
Antibody Screen: NEGATIVE

## 2023-09-06 NOTE — Patient Instructions (Addendum)
 SURGICAL WAITING ROOM VISITATION  Patients having surgery or a procedure may have no more than 2 support people in the waiting area - these visitors may rotate.    Children under the age of 40 must have an adult with them who is not the patient.  Visitors with respiratory illnesses are discouraged from visiting and should remain at home.  If the patient needs to stay at the hospital during part of their recovery, the visitor guidelines for inpatient rooms apply. Pre-op nurse will coordinate an appropriate time for 1 support person to accompany patient in pre-op.  This support person may not rotate.    Please refer to the [facility] website for the visitor guidelines for Inpatients (after your surgery is over and you are in a regular room).    Your procedure is scheduled on: 09/18/23   Report to [facility] Main Entrance    Report to admitting at 6:15 AM   Call this number if you have problems the morning of surgery [phone]   Do not eat food :After Midnight.   After Midnight you may have the following liquids until 5:30 AM DAY OF SURGERY  Water Non-Citrus Juices (without pulp, NO RED-Apple, White grape, White cranberry) Black Coffee (NO MILK/CREAM OR CREAMERS, sugar ok)  Clear Tea (NO MILK/CREAM OR CREAMERS, sugar ok) regular and decaf                             Plain Jell-O (NO RED)                                           Fruit ices (not with fruit pulp, NO RED)                                     Popsicles (NO RED)                                                               Sports drinks like Gatorade (NO RED)          If you have questions, please contact your surgeon's office.   FOLLOW BOWEL PREP AND ANY ADDITIONAL PRE OP INSTRUCTIONS YOU RECEIVED FROM YOUR SURGEON'S OFFICE!!!     Oral Hygiene is also important to reduce your risk of infection.                                    Remember - BRUSH YOUR TEETH THE MORNING OF SURGERY WITH YOUR REGULAR  TOOTHPASTE  DENTURES WILL BE REMOVED PRIOR TO SURGERY PLEASE DO NOT APPLY Poly grip OR ADHESIVES!!!   Stop all vitamins and herbal supplements 7 days before surgery.   Take these medicines the morning of surgery with A SIP OF WATER: Famotidine, Metoprolol , Rosuvastatin                                You may not have any metal  on your body including hair pins, jewelry, and body piercing             Do not wear make-up, lotions, powders, perfumes, or deodorant  Do not wear nail polish including gel and S&S, artificial/acrylic nails, or any other type of covering on natural nails including finger and toenails. If you have artificial nails, gel coating, etc. that needs to be removed by a nail salon please have this removed prior to surgery or surgery may need to be canceled/ delayed if the surgeon/ anesthesia feels like they are unable to be safely monitored.   Do not shave  48 hours prior to surgery.    Do not bring valuables to the hospital. [facility] IS NOT             RESPONSIBLE   FOR VALUABLES.   Contacts, glasses, dentures or bridgework may not be worn into surgery.  DO NOT BRING YOUR HOME MEDICATIONS TO THE HOSPITAL. PHARMACY WILL DISPENSE MEDICATIONS LISTED ON YOUR MEDICATION LIST TO YOU DURING YOUR ADMISSION IN THE HOSPITAL!    Patients discharged on the day of surgery will not be allowed to drive home.  Someone NEEDS to stay with you for the first 24 hours after anesthesia.   Special Instructions: Bring a copy of your healthcare power of attorney and living will documents the day of surgery if you haven't scanned them before.              Please read over the following fact sheets you were given: IF YOU HAVE QUESTIONS ABOUT YOUR PRE-OP INSTRUCTIONS PLEASE CALL [phone] Party   If you received a COVID test during your pre-op visit  it is requested that you wear a mask when out in public, stay away from anyone that may not be feeling well and notify your surgeon if you  develop symptoms. If you test positive for Covid or have been in contact with anyone that has tested positive in the last 10 days please notify you surgeon.    [place] - Preparing for Surgery Before surgery, you can play an important role.  Because skin is not sterile, your skin needs to be as free of germs as possible.  You can reduce the number of germs on your skin by washing with CHG (chlorahexidine gluconate) soap before surgery.  CHG is an antiseptic cleaner which kills germs and bonds with the skin to continue killing germs even after washing. Please DO NOT use if you have an allergy to CHG or antibacterial soaps.  If your skin becomes reddened/irritated stop using the CHG and inform your nurse when you arrive at Short Stay. Do not shave (including legs and underarms) for at least 48 hours prior to the first CHG shower.  You may shave your face/neck.  Please follow these instructions carefully:  1.  Shower with CHG Soap the night before surgery and the  morning of surgery.  2.  If you choose to wash your hair, wash your hair first as usual with your normal  shampoo.  3.  After you shampoo, rinse your hair and body thoroughly to remove the shampoo.                             4.  Use CHG as you would any other liquid soap.  You can apply chg directly to the skin and wash.  Gently with a scrungie or clean washcloth.  5.  Apply the CHG Soap to your body ONLY FROM THE NECK DOWN.   Do   not use on face/ open                           Wound or open sores. Avoid contact with eyes, ears mouth and   genitals (private parts).                       Wash face,  Genitals (private parts) with your normal soap.             6.  Wash thoroughly, paying special attention to the area where your    surgery  will be performed.  7.  Thoroughly rinse your body with warm water from the neck down.  8.  DO NOT shower/wash with your normal soap after using and rinsing off the CHG Soap.                9.  Pat  yourself dry with a clean towel.            10.  Wear clean pajamas.            11.  Place clean sheets on your bed the night of your first shower and do not  sleep with pets. Day of Surgery : Do not apply any lotions/deodorants the morning of surgery.  Please wear clean clothes to the hospital/surgery center.  FAILURE TO FOLLOW THESE INSTRUCTIONS MAY RESULT IN THE CANCELLATION OF YOUR SURGERY  PATIENT SIGNATURE_________________________________  NURSE SIGNATURE__________________________________  ________________________________________________________________________ WHAT IS A BLOOD TRANSFUSION? Blood Transfusion Information  A transfusion is the replacement of blood or some of its parts. Blood is made up of multiple cells which provide different functions. Red blood cells carry oxygen and are used for blood loss replacement. White blood cells fight against infection. Platelets control bleeding. Plasma helps clot blood. Other blood products are available for specialized needs, such as hemophilia or other clotting disorders. BEFORE THE TRANSFUSION  Who gives blood for transfusions?  Healthy volunteers who are fully evaluated to make sure their blood is safe. This is blood bank blood. Transfusion therapy is the safest it has ever been in the practice of medicine. Before blood is taken from a donor, a complete history is taken to make sure that person has no history of diseases nor engages in risky social behavior (examples are intravenous drug use or sexual activity with multiple partners). The donor's travel history is screened to minimize risk of transmitting infections, such as malaria. The donated blood is tested for signs of infectious diseases, such as HIV and hepatitis. The blood is then tested to be sure it is compatible with you in order to minimize the chance of a transfusion reaction. If you or a relative donates blood, this is often done in anticipation of surgery and is not  appropriate for emergency situations. It takes many days to process the donated blood. RISKS AND COMPLICATIONS Although transfusion therapy is very safe and saves many lives, the main dangers of transfusion include:  Getting an infectious disease. Developing a transfusion reaction. This is an allergic reaction to something in the blood you were given. Every precaution is taken to prevent this. The decision to have a blood transfusion has been considered carefully by your caregiver before blood is given. Blood is not given unless the benefits outweigh the risks. AFTER THE TRANSFUSION Right after receiving a blood transfusion,  you will usually feel much better and more energetic. This is especially true if your red blood cells have gotten low (anemic). The transfusion raises the level of the red blood cells which carry oxygen, and this usually causes an energy increase. The nurse administering the transfusion will monitor you carefully for complications. HOME CARE INSTRUCTIONS  No special instructions are needed after a transfusion. You may find your energy is better. Speak with your caregiver about any limitations on activity for underlying diseases you may have. SEEK MEDICAL CARE IF:  Your condition is not improving after your transfusion. You develop redness or irritation at the intravenous (IV) site. SEEK IMMEDIATE MEDICAL CARE IF:  Any of the following symptoms occur over the next 12 hours: Shaking chills. You have a temperature by mouth above 102 F (38.9 C), not controlled by medicine. Chest, back, or muscle pain. People around you feel you are not acting correctly or are confused. Shortness of breath or difficulty breathing. Dizziness and fainting. You get a rash or develop hives. You have a decrease in urine output. Your urine turns a dark color or changes to pink, red, or brown. Any of the following symptoms occur over the next 10 days: You have a temperature by mouth above 102 F  (38.9 C), not controlled by medicine. Shortness of breath. Weakness after normal activity. The white part of the eye turns yellow (jaundice). You have a decrease in the amount of urine or are urinating less often. Your urine turns a dark color or changes to pink, red, or brown. Document Released: 03/02/2000 Document Revised: 05/28/2011 Document Reviewed: 10/20/2007 [facility] Patient Information 2014 [place], [place].  _______________________________________________________________________

## 2023-09-06 NOTE — Telephone Encounter (Signed)
 Attempted to reach Pamela Lowe in regards to her medication recommendations prior to her surgery with Dr. Orvil Bland on 7/2. Left voicemail requesting call back.

## 2023-09-06 NOTE — Telephone Encounter (Signed)
 Per Vira Grieves NP, recent labs faxed to PCP.

## 2023-09-06 NOTE — Telephone Encounter (Signed)
 Pt returned call from office. Pt is aware to stop her plavix  and aspirin  5 days prior to her surgery on 7/2. Pt's last dose of plavix  and aspirin  will be on Thursday, 6/26.   Pt states she has an appointment with Dr. Henrine Logan her Rhematologist on Monday at 0815. Pt will call the office to confirm instructions on her Leflunomide that she takes daily. And pt states her Aria (Simponi ) injection that she takes every 8 weeks, per patient surgery  needs to be in between her dose. Last dose May 21 st, next dose due 7/16.  Will confirm with Dr. Henrine Logan on 6/23.  Pt thanked the office for calling.

## 2023-09-09 DIAGNOSIS — M47819 Spondylosis without myelopathy or radiculopathy, site unspecified: Secondary | ICD-10-CM | POA: Diagnosis not present

## 2023-09-09 DIAGNOSIS — R7989 Other specified abnormal findings of blood chemistry: Secondary | ICD-10-CM | POA: Diagnosis not present

## 2023-09-09 DIAGNOSIS — L4050 Arthropathic psoriasis, unspecified: Secondary | ICD-10-CM | POA: Diagnosis not present

## 2023-09-09 DIAGNOSIS — M1990 Unspecified osteoarthritis, unspecified site: Secondary | ICD-10-CM | POA: Diagnosis not present

## 2023-09-09 DIAGNOSIS — Z79899 Other long term (current) drug therapy: Secondary | ICD-10-CM | POA: Diagnosis not present

## 2023-09-09 DIAGNOSIS — E669 Obesity, unspecified: Secondary | ICD-10-CM | POA: Diagnosis not present

## 2023-09-09 DIAGNOSIS — M51379 Other intervertebral disc degeneration, lumbosacral region without mention of lumbar back pain or lower extremity pain: Secondary | ICD-10-CM | POA: Diagnosis not present

## 2023-09-09 DIAGNOSIS — M25569 Pain in unspecified knee: Secondary | ICD-10-CM | POA: Diagnosis not present

## 2023-09-09 NOTE — Telephone Encounter (Signed)
 Spoke with Pamela Lowe who called the office to relay that she was seen by Dr. Leni this morning and her recommendations for the patient prior to her surgery are that she can stop taking her leflunomide the day of surgery and resume back on it July 4th.  Also patient is in a good place in regards to her Aria injection and she is able to take that at the end of July. (Pt only takes this every 8 weeks and will be in between doses for her surgery). The office thanked the patient for calling back and reminded of her pre-op call on Tuesday, July 1st.

## 2023-09-10 NOTE — Progress Notes (Signed)
 Anesthesia Chart Review   Case: [id number] Date/Time: 09/18/23 0815   Procedures:      SALPINGO-OOPHORECTOMY, BILATERAL, ROBOT-ASSISTED (Bilateral) - POSSIBLE LAPAROTOMY     TRACHELECTOMY, ROBOT ASSIST - POSSIBLE ROBOT ASSISTED TRACHELECTOMY   Anesthesia type: General   Diagnosis: Lynch syndrome [Z15.09]   Pre-op diagnosis: Lynch syndrome   Location: [facility]OM 05 / [facility]   Surgeons: Viktoria Comer SAUNDERS, MD       DISCUSSION:73 y.o. former smoker with h/o GERD, meningioma s/p resection at [facility] in 01/2021, HTN, CAD s/p LAD PCI 12/2018, PAD, CKD Stage III, Lynch syndrome scheduled for above procedure 09/18/2023 with Dr. Comer Viktoria.   Pt last seen by cardiology 08/29/23. Per OV note, Upcoming surgery for prophylactic ovary removal. OK to hold aspirin  and Plavix  for 5 days if needed. She is low risk for surgery as she can complete more than 4 METS of physical activity and exercises regularly.   VS: BP 130/84   Pulse (!) 59   Temp 36.8 C (Oral)   Resp 16   Ht 5' 7 (1.702 m)   Wt 87.5 kg   LMP 03/19/1998 Comment: h/o supracervical hysterectomy  SpO2 99%   BMI 30.23 kg/m   PROVIDERS: Levora Reyes SAUNDERS, MD is PCP   Raford Riggs, MD is Cardiologist  LABS: Labs reviewed: Acceptable for surgery. (all labs ordered are listed, but only abnormal results are displayed)  Labs Reviewed  COMPREHENSIVE METABOLIC PANEL WITH GFR - Abnormal; Notable for the following components:      Result Value   Creatinine, Ser 1.02 (*)    AST 53 (*)    ALT 47 (*)    Total Bilirubin 1.5 (*)    GFR, Estimated 58 (*)    All other components within normal limits  CBC  TYPE AND SCREEN     IMAGES:   EKG:   CV: Echo 05/27/2018  1. The left ventricle has normal systolic function with an ejection  fraction of 60-65%. The cavity size was normal. There is mildly increased  left ventricular wall thickness. Left ventricular diastolic Doppler  parameters are consistent with  impaired  relaxation.   2. The right ventricle has normal systolic function. The cavity was  normal. There is no increase in right ventricular wall thickness.   3. There is mild mitral annular calcification present.   4. Mild thickening of the aortic valve Mild calcification of the aortic  valve.   5. There is mild dilatation of the ascending aorta measuring 39 mm.  Past Medical History:  Diagnosis Date   Allergic rhinitis    Allergy    Arthritis    Brain tumor (HCC)    Breast cyst 2007   Right   Cataract    surgery 07/2017   Clotting disorder (HCC)    On plavix  since stents  for artery blockages   Coronary artery disease    Depression 2006   w/ menopause   Depression screening 09/27/2021   Family history of adverse reaction to anesthesia    sister PONV   Family history of breast cancer    Family history of colon cancer    Family history of kidney cancer 01/15/2018   Family history of lung cancer    Family history of pancreatic cancer    Family history of prostate cancer    Family history of uterine cancer    GERD (gastroesophageal reflux disease)    Glaucoma    Heart murmur    History  of hysterectomy, supracervical    Hyperlipidemia    Hypertension    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   MLH1 c.1410-2_1410-1delinsCC (Splice site)   Multinodular goiter    PAD (peripheral artery disease) (HCC)    LE Art US  1/18: Occluded prox-mid R SFA // ABIs 9/22: R 0.83; L 1.0   Pustular psoriasis    Tobacco user     Past Surgical History:  Procedure Laterality Date   BIOPSY  01/15/2022   Procedure: BIOPSY;  Surgeon: Wilhelmenia Aloha Raddle., MD;  Location: [facility];  Service: Gastroenterology;;   BONE BIOPSY  06/24/2023   Procedure: BIOPSY;  Surgeon: Wilhelmenia Aloha Raddle., MD;  [facility];  Service: Gastroenterology;;   BRAIN TUMOR EXCISION  02/24/2021   at [facility]   BREAST BIOPSY Bilateral 1985   CARDIAC CATHETERIZATION     CATARACT EXTRACTION Right 07/2017    COLONOSCOPY     COLONOSCOPY WITH PROPOFOL  N/A 01/15/2022   Procedure: COLONOSCOPY WITH PROPOFOL ;  Surgeon: Wilhelmenia Aloha Raddle., MD;  Location: [facility];  Service: Gastroenterology;  Laterality: N/A;   COLONOSCOPY WITH PROPOFOL  N/A 06/24/2023   Procedure: COLONOSCOPY WITH PROPOFOL ;  Surgeon: Mansouraty, Aloha Raddle., MD;  [facility];  Service: Gastroenterology;  Laterality: N/A;   CORONARY STENT INTERVENTION N/A 12/26/2018   Procedure: CORONARY STENT INTERVENTION;  Surgeon: Claudene Victory ORN, MD;  [facility];  Service: Cardiovascular;  Laterality: N/A;   ESOPHAGOGASTRODUODENOSCOPY (EGD) WITH PROPOFOL  N/A 01/15/2022   Procedure: ESOPHAGOGASTRODUODENOSCOPY (EGD) WITH PROPOFOL ;  Surgeon: Wilhelmenia Aloha Raddle., MD;  [facility];  Service: Gastroenterology;  Laterality: N/A;   ESOPHAGOGASTRODUODENOSCOPY (EGD) WITH PROPOFOL  N/A 06/24/2023   Procedure: ESOPHAGOGASTRODUODENOSCOPY (EGD) WITH PROPOFOL ;  Surgeon: Wilhelmenia Aloha Raddle., MD;  [facility];  Service: Gastroenterology;  Laterality: N/A;   EYE SURGERY  2019, 2020   Cataracts, eye shunt implants   LEFT HEART CATH AND CORONARY ANGIOGRAPHY N/A 12/17/2018   Procedure: LEFT HEART CATH AND CORONARY ANGIOGRAPHY;  Surgeon: Claudene Victory ORN, MD;  [facility];  Service: Cardiovascular;  Laterality: N/A;   POLYPECTOMY  01/15/2022   Procedure: POLYPECTOMY;  Surgeon: Wilhelmenia Aloha Raddle., MD;  [facility];  Service: Gastroenterology;;   POLYPECTOMY  06/24/2023   Procedure: POLYPECTOMY, INTESTINE;  Surgeon: Wilhelmenia Aloha Raddle., MD;  [facility];  Service: Gastroenterology;;   SUPRACERVICAL ABDOMINAL HYSTERECTOMY  1986   UPPER GASTROINTESTINAL ENDOSCOPY      MEDICATIONS:  aspirin  81 MG EC tablet   B Complex Vitamins (VITAMIN B COMPLEX) TABS   betamethasone , augmented, (DIPROLENE ) 0.05 % lotion   brimonidine  (ALPHAGAN ) 0.2 % ophthalmic solution    calcipotriene (DOVONOX) 0.005 % cream   CALCIUM -VITAMIN D  PO   Cholecalciferol (VITAMIN D ) 50 MCG (2000 UT) tablet   clopidogrel  (PLAVIX ) 75 MG tablet   Collagen-Vitamin C-Biotin (COLLAGEN PO)   dorzolamide -timolol (COSOPT) 22.3-6.8 MG/ML ophthalmic solution   famotidine (PEPCID) 20 MG tablet   folic acid  (FOLVITE ) 1 MG tablet   hydrochlorothiazide  (MICROZIDE ) 12.5 MG capsule   hydrocortisone  (ANUSOL -HC) 2.5 % rectal cream   leflunomide (ARAVA) 20 MG tablet   metoprolol  succinate (TOPROL -XL) 25 MG 24 hr tablet   minoxidil (LONITEN) 2.5 MG tablet   Multiple Vitamins-Minerals (MULTIVITAMIN WITH MINERALS) tablet   REPATHA  SURECLICK 140 MG/ML SOAJ   rosuvastatin  (CRESTOR ) 20 MG tablet   SIMPONI  ARIA 50 MG/4ML SOLN injection   VYZULTA 0.024 % SOLN   No current facility-administered medications for this encounter.    Harlene Hoots Ward, PA-C [facility] [phone]

## 2023-09-16 ENCOUNTER — Other Ambulatory Visit: Payer: Self-pay | Admitting: Acute Care

## 2023-09-16 DIAGNOSIS — Z87891 Personal history of nicotine dependence: Secondary | ICD-10-CM

## 2023-09-16 DIAGNOSIS — Z122 Encounter for screening for malignant neoplasm of respiratory organs: Secondary | ICD-10-CM

## 2023-09-17 ENCOUNTER — Telehealth: Payer: Self-pay | Admitting: *Deleted

## 2023-09-17 ENCOUNTER — Other Ambulatory Visit: Payer: Self-pay | Admitting: Gynecologic Oncology

## 2023-09-17 DIAGNOSIS — Z1509 Genetic susceptibility to other malignant neoplasm: Secondary | ICD-10-CM

## 2023-09-17 MED ORDER — SENNOSIDES-DOCUSATE SODIUM 8.6-50 MG PO TABS: 2.0000 | ORAL_TABLET | Freq: Every day | ORAL | 0 refills | Status: DC

## 2023-09-17 MED ORDER — TRAMADOL HCL 50 MG PO TABS: 50.0000 mg | ORAL_TABLET | Freq: Four times a day (QID) | ORAL | 0 refills | Status: DC | PRN

## 2023-09-17 NOTE — Telephone Encounter (Signed)
 Telephone call to check on pre-operative status.  Patient compliant with pre-operative instructions.  Reinforced nothing to eat after midnight. Clear liquids until 0515. Patient to arrive at 0615.  No questions or concerns voiced.  Instructed to call for any needs.

## 2023-09-17 NOTE — Anesthesia Preprocedure Evaluation (Signed)
 Anesthesia Evaluation  Patient identified by MRN, date of birth, ID band Patient awake    Reviewed: Allergy & Precautions, NPO status , Patient's Chart, lab work & pertinent test results, reviewed documented beta blocker date and time   Airway Mallampati: III  TM Distance: >3 FB Neck ROM: Full    Dental no notable dental hx. (+) Teeth Intact, Dental Advisory Given   Pulmonary former smoker   Pulmonary exam normal breath sounds clear to auscultation       Cardiovascular hypertension, Pt. on home beta blockers and Pt. on medications + CAD, + Cardiac Stents and + Peripheral Vascular Disease  Normal cardiovascular exam Rhythm:Regular Rate:Normal  TTE 2020 1. The left ventricle has normal systolic function with an ejection  fraction of 60-65%. The cavity size was normal. There is mildly increased  left ventricular wall thickness. Left ventricular diastolic Doppler  parameters are consistent with impaired  relaxation.   2. The right ventricle has normal systolic function. The cavity was  normal. There is no increase in right ventricular wall thickness.   3. There is mild mitral annular calcification present.   4. Mild thickening of the aortic valve Mild calcification of the aortic  valve.   5. There is mild dilatation of the ascending aorta measuring 39 mm.   Cath 2020  Successful stent implantation in the mid segment of the second diagonal reducing 95% stenosis to 0% with TIMI grade III flow using a 2.0 x 18 Onyx postdilated the 2.25 mm in diameter.    Successful overlapping stent implantation in the mid to distal LAD using a 2.5 x 26 stent proximally overlapping a 22 x 2.5 mm stent more distally all postdilated to 2.75 mm.  Total occlusion with TIMI grade 0 flow was reduced to 0% with TIMI grade III flow.     Neuro/Psych  PSYCHIATRIC DISORDERS  Depression    negative neurological ROS     GI/Hepatic Neg liver ROS,GERD  ,,   Endo/Other  negative endocrine ROS    Renal/GU negative Renal ROS  negative genitourinary   Musculoskeletal  (+) Arthritis ,    Abdominal   Peds  Hematology  (+) Blood dyscrasia (plavix )   Anesthesia Other Findings Lynch syndrome  Reproductive/Obstetrics                              Anesthesia Physical Anesthesia Plan  ASA: 3  Anesthesia Plan: General   Post-op Pain Management: Tylenol  PO (pre-op)*   Induction: Intravenous  PONV Risk Score and Plan: 3 and Dexamethasone, Ondansetron  and Treatment may vary due to age or medical condition  Airway Management Planned: Oral ETT  Additional Equipment:   Intra-op Plan:   Post-operative Plan: Extubation in OR  Informed Consent: I have reviewed the patients History and Physical, chart, labs and discussed the procedure including the risks, benefits and alternatives for the proposed anesthesia with the patient or authorized representative who has indicated his/her understanding and acceptance.     Dental advisory given  Plan Discussed with: CRNA  Anesthesia Plan Comments: (2 IVs)        Anesthesia Quick Evaluation

## 2023-09-18 ENCOUNTER — Other Ambulatory Visit: Payer: Self-pay

## 2023-09-18 ENCOUNTER — Ambulatory Visit (HOSPITAL_COMMUNITY): Payer: Self-pay | Admitting: Physician Assistant

## 2023-09-18 ENCOUNTER — Ambulatory Visit (HOSPITAL_COMMUNITY)
Admission: RE | Admit: 2023-09-18 | Discharge: 2023-09-18 | Disposition: A | Discharge status: Home / Self Care | Source: Ambulatory Visit | Attending: Gynecologic Oncology | Admitting: Gynecologic Oncology

## 2023-09-18 ENCOUNTER — Ambulatory Visit (HOSPITAL_BASED_OUTPATIENT_CLINIC_OR_DEPARTMENT_OTHER): Payer: Self-pay | Admitting: Anesthesiology

## 2023-09-18 ENCOUNTER — Encounter (HOSPITAL_COMMUNITY)
Admission: RE | Disposition: A | Discharge status: Home / Self Care | Payer: Self-pay | Source: Ambulatory Visit | Attending: Gynecologic Oncology

## 2023-09-18 ENCOUNTER — Encounter (HOSPITAL_COMMUNITY): Payer: Self-pay | Admitting: Gynecologic Oncology

## 2023-09-18 ENCOUNTER — Other Ambulatory Visit: Payer: Self-pay | Admitting: Cardiovascular Disease

## 2023-09-18 DIAGNOSIS — N736 Female pelvic peritoneal adhesions (postinfective): Secondary | ICD-10-CM | POA: Diagnosis not present

## 2023-09-18 DIAGNOSIS — Z148 Genetic carrier of other disease: Secondary | ICD-10-CM | POA: Diagnosis not present

## 2023-09-18 DIAGNOSIS — K219 Gastro-esophageal reflux disease without esophagitis: Secondary | ICD-10-CM | POA: Diagnosis not present

## 2023-09-18 DIAGNOSIS — Z7902 Long term (current) use of antithrombotics/antiplatelets: Secondary | ICD-10-CM | POA: Diagnosis not present

## 2023-09-18 DIAGNOSIS — I10 Essential (primary) hypertension: Secondary | ICD-10-CM | POA: Diagnosis not present

## 2023-09-18 DIAGNOSIS — I2510 Atherosclerotic heart disease of native coronary artery without angina pectoris: Secondary | ICD-10-CM | POA: Insufficient documentation

## 2023-09-18 DIAGNOSIS — N879 Dysplasia of cervix uteri, unspecified: Secondary | ICD-10-CM | POA: Diagnosis not present

## 2023-09-18 DIAGNOSIS — Z1509 Genetic susceptibility to other malignant neoplasm: Secondary | ICD-10-CM | POA: Insufficient documentation

## 2023-09-18 DIAGNOSIS — Z8049 Family history of malignant neoplasm of other genital organs: Secondary | ICD-10-CM | POA: Diagnosis not present

## 2023-09-18 DIAGNOSIS — N838 Other noninflammatory disorders of ovary, fallopian tube and broad ligament: Secondary | ICD-10-CM | POA: Insufficient documentation

## 2023-09-18 DIAGNOSIS — Z4002 Encounter for prophylactic removal of ovary: Secondary | ICD-10-CM

## 2023-09-18 DIAGNOSIS — Z4009 Encounter for prophylactic removal of other organ: Secondary | ICD-10-CM

## 2023-09-18 DIAGNOSIS — I700 Atherosclerosis of aorta: Secondary | ICD-10-CM

## 2023-09-18 DIAGNOSIS — Z955 Presence of coronary angioplasty implant and graft: Secondary | ICD-10-CM | POA: Insufficient documentation

## 2023-09-18 DIAGNOSIS — Z90711 Acquired absence of uterus with remaining cervical stump: Secondary | ICD-10-CM

## 2023-09-18 DIAGNOSIS — Z87891 Personal history of nicotine dependence: Secondary | ICD-10-CM | POA: Diagnosis not present

## 2023-09-18 HISTORY — PX: ROBOTIC ASSISTED BILATERAL SALPINGO OOPHERECTOMY: SHX6078

## 2023-09-18 HISTORY — PX: ROBOT ASSISTED TRACHELECTOMY: SHX6728

## 2023-09-18 LAB — ABO/RH: ABO/RH(D): A POS

## 2023-09-18 SURGERY — SALPINGO-OOPHORECTOMY, BILATERAL, ROBOT-ASSISTED
Anesthesia: General | Site: Cervix

## 2023-09-18 MED ORDER — LACTATED RINGERS IV SOLN: INTRAVENOUS | Status: DC

## 2023-09-18 MED ORDER — SODIUM CHLORIDE 0.9 % IV SOLN: INTRAVENOUS | Status: DC | PRN

## 2023-09-18 MED ORDER — ROCURONIUM BROMIDE 100 MG/10ML IV SOLN
INTRAVENOUS | Status: DC | PRN
Start: 2023-09-18 — End: 2023-09-18
  Administered 2023-09-18: 15 mg via INTRAVENOUS
  Administered 2023-09-18: 50 mg via INTRAVENOUS

## 2023-09-18 MED ORDER — HEPARIN SODIUM (PORCINE) 5000 UNIT/ML IJ SOLN
5000.0000 [IU] | INTRAMUSCULAR | Status: AC
  Administered 2023-09-18: 5000 [IU] via SUBCUTANEOUS
  Filled 2023-09-18: qty 1

## 2023-09-18 MED ORDER — PHENYLEPHRINE 80 MCG/ML (10ML) SYRINGE FOR IV PUSH (FOR BLOOD PRESSURE SUPPORT)
PREFILLED_SYRINGE | INTRAVENOUS | Status: AC
  Filled 2023-09-18: qty 10

## 2023-09-18 MED ORDER — CHLORHEXIDINE GLUCONATE 0.12 % MT SOLN
15.0000 mL | Freq: Once | OROMUCOSAL | Status: AC
  Administered 2023-09-18: 15 mL via OROMUCOSAL

## 2023-09-18 MED ORDER — PROPOFOL 10 MG/ML IV BOLUS
INTRAVENOUS | Status: AC
  Filled 2023-09-18: qty 20

## 2023-09-18 MED ORDER — GLYCOPYRROLATE 0.2 MG/ML IJ SOLN
INTRAMUSCULAR | Status: DC | PRN
  Administered 2023-09-18: .2 mg via INTRAVENOUS

## 2023-09-18 MED ORDER — LACTATED RINGERS IR SOLN
Status: DC | PRN
  Administered 2023-09-18: 1000 mL

## 2023-09-18 MED ORDER — GLYCOPYRROLATE 0.2 MG/ML IJ SOLN
INTRAMUSCULAR | Status: AC
  Filled 2023-09-18: qty 1

## 2023-09-18 MED ORDER — SUGAMMADEX SODIUM 200 MG/2ML IV SOLN
INTRAVENOUS | Status: DC | PRN
Start: 2023-09-18 — End: 2023-09-18
  Administered 2023-09-18: 200 mg via INTRAVENOUS

## 2023-09-18 MED ORDER — BUPIVACAINE HCL (PF) 0.25 % IJ SOLN
INTRAMUSCULAR | Status: AC
  Filled 2023-09-18: qty 30

## 2023-09-18 MED ORDER — BUPIVACAINE HCL 0.25 % IJ SOLN
INTRAMUSCULAR | Status: DC | PRN
  Administered 2023-09-18: 30 mL

## 2023-09-18 MED ORDER — EPHEDRINE SULFATE-NACL 50-0.9 MG/10ML-% IV SOSY
PREFILLED_SYRINGE | INTRAVENOUS | Status: DC | PRN
  Administered 2023-09-18 (×2): 10 mg via INTRAVENOUS

## 2023-09-18 MED ORDER — EPHEDRINE 5 MG/ML INJ
INTRAVENOUS | Status: AC
  Filled 2023-09-18: qty 5

## 2023-09-18 MED ORDER — FENTANYL CITRATE (PF) 250 MCG/5ML IJ SOLN
INTRAMUSCULAR | Status: AC
Start: 2023-09-18 — End: 2023-09-18
  Filled 2023-09-18: qty 5

## 2023-09-18 MED ORDER — ONDANSETRON HCL 4 MG/2ML IJ SOLN
INTRAMUSCULAR | Status: AC
  Filled 2023-09-18: qty 2

## 2023-09-18 MED ORDER — OXYCODONE HCL 5 MG PO TABS
5.0000 mg | ORAL_TABLET | Freq: Once | ORAL | Status: AC | PRN
  Administered 2023-09-18: 5 mg via ORAL

## 2023-09-18 MED ORDER — HYDROMORPHONE HCL 1 MG/ML IJ SOLN
INTRAMUSCULAR | Status: DC | PRN
  Administered 2023-09-18: .2 mg via INTRAVENOUS

## 2023-09-18 MED ORDER — AMISULPRIDE (ANTIEMETIC) 5 MG/2ML IV SOLN: 10.0000 mg | Freq: Once | INTRAVENOUS | Status: DC | PRN

## 2023-09-18 MED ORDER — ROCURONIUM BROMIDE 10 MG/ML (PF) SYRINGE
PREFILLED_SYRINGE | INTRAVENOUS | Status: AC
  Filled 2023-09-18: qty 10

## 2023-09-18 MED ORDER — DEXMEDETOMIDINE HCL IN NACL 80 MCG/20ML IV SOLN
INTRAVENOUS | Status: DC | PRN
  Administered 2023-09-18: 4 ug via INTRAVENOUS
  Administered 2023-09-18: 6 ug via INTRAVENOUS

## 2023-09-18 MED ORDER — OXYCODONE HCL 5 MG PO TABS
ORAL_TABLET | ORAL | Status: AC
  Filled 2023-09-18: qty 1

## 2023-09-18 MED ORDER — STERILE WATER FOR IRRIGATION IR SOLN
Status: DC | PRN
  Administered 2023-09-18: 1000 mL

## 2023-09-18 MED ORDER — LIDOCAINE HCL (PF) 2 % IJ SOLN
INTRAMUSCULAR | Status: DC | PRN
  Administered 2023-09-18: 80 mg via INTRADERMAL

## 2023-09-18 MED ORDER — ORAL CARE MOUTH RINSE: 15.0000 mL | Freq: Once | OROMUCOSAL | Status: AC

## 2023-09-18 MED ORDER — ONDANSETRON HCL 4 MG/2ML IJ SOLN
INTRAMUSCULAR | Status: DC | PRN
  Administered 2023-09-18: 4 mg via INTRAVENOUS

## 2023-09-18 MED ORDER — OXYCODONE HCL 5 MG/5ML PO SOLN: 5.0000 mg | Freq: Once | ORAL | Status: AC | PRN

## 2023-09-18 MED ORDER — CEFAZOLIN SODIUM-DEXTROSE 2-3 GM-%(50ML) IV SOLR
INTRAVENOUS | Status: DC | PRN
  Administered 2023-09-18: 2 g via INTRAVENOUS

## 2023-09-18 MED ORDER — PROPOFOL 10 MG/ML IV BOLUS
INTRAVENOUS | Status: DC | PRN
  Administered 2023-09-18: 100 mg via INTRAVENOUS

## 2023-09-18 MED ORDER — ACETAMINOPHEN 500 MG PO TABS
1000.0000 mg | ORAL_TABLET | ORAL | Status: AC
Start: 2023-09-18 — End: 2023-09-18
  Administered 2023-09-18: 1000 mg via ORAL
  Filled 2023-09-18: qty 2

## 2023-09-18 MED ORDER — PHENYLEPHRINE 80 MCG/ML (10ML) SYRINGE FOR IV PUSH (FOR BLOOD PRESSURE SUPPORT)
PREFILLED_SYRINGE | INTRAVENOUS | Status: DC | PRN
Start: 2023-09-18 — End: 2023-09-18
  Administered 2023-09-18: 160 ug via INTRAVENOUS
  Administered 2023-09-18: 80 ug via INTRAVENOUS

## 2023-09-18 MED ORDER — LIDOCAINE HCL 1 % IJ SOLN
INTRAMUSCULAR | Status: AC
  Filled 2023-09-18: qty 20

## 2023-09-18 MED ORDER — DEXAMETHASONE SODIUM PHOSPHATE 10 MG/ML IJ SOLN
INTRAMUSCULAR | Status: DC | PRN
  Administered 2023-09-18: 4 mg via INTRAVENOUS

## 2023-09-18 MED ORDER — ACETAMINOPHEN 500 MG PO TABS: 1000.0000 mg | ORAL_TABLET | Freq: Once | ORAL | Status: DC

## 2023-09-18 MED ORDER — FENTANYL CITRATE PF 50 MCG/ML IJ SOSY: 25.0000 ug | PREFILLED_SYRINGE | INTRAMUSCULAR | Status: DC | PRN

## 2023-09-18 MED ORDER — HYDROMORPHONE HCL 2 MG/ML IJ SOLN
INTRAMUSCULAR | Status: AC
  Filled 2023-09-18: qty 1

## 2023-09-18 MED ORDER — FENTANYL CITRATE (PF) 250 MCG/5ML IJ SOLN
INTRAMUSCULAR | Status: DC | PRN
  Administered 2023-09-18: 50 ug via INTRAVENOUS
  Administered 2023-09-18: 75 ug via INTRAVENOUS
  Administered 2023-09-18: 25 ug via INTRAVENOUS

## 2023-09-18 MED ORDER — DEXAMETHASONE SODIUM PHOSPHATE 10 MG/ML IJ SOLN
INTRAMUSCULAR | Status: AC
  Filled 2023-09-18: qty 1

## 2023-09-18 SURGICAL SUPPLY — 67 items
APPLICATOR SURGIFLO ENDO (HEMOSTASIS) IMPLANT
BAG COUNTER SPONGE SURGICOUNT (BAG) IMPLANT
BAG LAPAROSCOPIC 12 15 PORT 16 (BASKET) IMPLANT
BLADE SURG SZ10 CARB STEEL (BLADE) IMPLANT
COVER BACK TABLE 60X90IN (DRAPES) ×2 IMPLANT
COVER TIP SHEARS 8 DVNC (MISCELLANEOUS) ×2 IMPLANT
DERMABOND ADVANCED .7 DNX12 (GAUZE/BANDAGES/DRESSINGS) ×2 IMPLANT
DRAPE ARM DVNC X/XI (DISPOSABLE) ×8 IMPLANT
DRAPE COLUMN DVNC XI (DISPOSABLE) ×2 IMPLANT
DRAPE SHEET LG 3/4 BI-LAMINATE (DRAPES) ×2 IMPLANT
DRAPE SURG IRRIG POUCH 19X23 (DRAPES) ×2 IMPLANT
DRIVER NDL MEGA SUTCUT DVNCXI (INSTRUMENTS) ×2 IMPLANT
DRIVER NDLE MEGA SUTCUT DVNCXI (INSTRUMENTS) ×2 IMPLANT
DRSG OPSITE POSTOP 4X6 (GAUZE/BANDAGES/DRESSINGS) IMPLANT
DRSG OPSITE POSTOP 4X8 (GAUZE/BANDAGES/DRESSINGS) IMPLANT
ELECT PENCIL ROCKER SW 15FT (MISCELLANEOUS) IMPLANT
ELECT REM PT RETURN 15FT ADLT (MISCELLANEOUS) ×2 IMPLANT
FORCEPS BPLR FENES DVNC XI (FORCEP) ×2 IMPLANT
FORCEPS PROGRASP DVNC XI (FORCEP) ×2 IMPLANT
GAUZE 4X4 16PLY ~~LOC~~+RFID DBL (SPONGE) ×4 IMPLANT
GLOVE BIO SURGEON STRL SZ 6 (GLOVE) ×8 IMPLANT
GLOVE BIO SURGEON STRL SZ 6.5 (GLOVE) ×2 IMPLANT
GOWN STRL REUS W/ TWL LRG LVL3 (GOWN DISPOSABLE) ×8 IMPLANT
GRASPER SUT TROCAR 14GX15 (MISCELLANEOUS) IMPLANT
HOLDER FOLEY CATH W/STRAP (MISCELLANEOUS) IMPLANT
IRRIGATION SUCT STRKRFLW 2 WTP (MISCELLANEOUS) ×2 IMPLANT
KIT PROCEDURE DVNC SI (MISCELLANEOUS) IMPLANT
KIT TURNOVER KIT A (KITS) ×2 IMPLANT
LIGASURE IMPACT 36 18CM CVD LR (INSTRUMENTS) IMPLANT
MANIPULATOR ADVINCU DEL 3.0 PL (MISCELLANEOUS) IMPLANT
MANIPULATOR ADVINCU DEL 3.5 PL (MISCELLANEOUS) IMPLANT
MANIPULATOR UTERINE 4.5 ZUMI (MISCELLANEOUS) IMPLANT
NDL HYPO 21X1.5 SAFETY (NEEDLE) ×2 IMPLANT
NDL SPNL 18GX3.5 QUINCKE PK (NEEDLE) IMPLANT
NEEDLE HYPO 21X1.5 SAFETY (NEEDLE) ×2 IMPLANT
NEEDLE SPNL 18GX3.5 QUINCKE PK (NEEDLE) IMPLANT
OBTURATOR OPTICALSTD 8 DVNC (TROCAR) ×2 IMPLANT
PACK ROBOT GYN CUSTOM WL (TRAY / TRAY PROCEDURE) ×2 IMPLANT
PAD POSITIONING PINK XL (MISCELLANEOUS) ×2 IMPLANT
PORT ACCESS TROCAR AIRSEAL 12 (TROCAR) IMPLANT
SCISSORS MNPLR CVD DVNC XI (INSTRUMENTS) ×2 IMPLANT
SCRUB CHG 4% DYNA-HEX 4OZ (MISCELLANEOUS) IMPLANT
SEAL UNIV 5-12 XI (MISCELLANEOUS) ×8 IMPLANT
SET TRI-LUMEN FLTR TB AIRSEAL (TUBING) ×2 IMPLANT
SPIKE FLUID TRANSFER (MISCELLANEOUS) ×2 IMPLANT
SPONGE T-LAP 18X18 ~~LOC~~+RFID (SPONGE) IMPLANT
SURGIFLO W/THROMBIN 8M KIT (HEMOSTASIS) IMPLANT
SUT MNCRL AB 4-0 PS2 18 (SUTURE) IMPLANT
SUT PDS AB 1 TP1 96 (SUTURE) IMPLANT
SUT STRATA PDS 0 30 CT-2.5 (SUTURE) IMPLANT
SUT V-LOC 180 0-0 GS22 (SUTURE) IMPLANT
SUT VIC AB 0 CT1 27XBRD ANTBC (SUTURE) IMPLANT
SUT VIC AB 2-0 CT1 TAPERPNT 27 (SUTURE) IMPLANT
SUT VIC AB 2-0 SH 27X BRD (SUTURE) IMPLANT
SUT VIC AB 4-0 PS2 18 (SUTURE) ×4 IMPLANT
SUT VICRYL 0 27 CT2 27 ABS (SUTURE) ×2 IMPLANT
SUT VLOC 180 0 9IN GS21 (SUTURE) IMPLANT
SYR 10ML LL (SYRINGE) IMPLANT
SYSTEM BAG RETRIEVAL 10MM (BASKET) IMPLANT
SYSTEM RETRIEVL 5MM INZII UNIV (BASKET) IMPLANT
SYSTEM WOUND ALEXIS 18CM MED (MISCELLANEOUS) IMPLANT
TRAP SPECIMEN MUCUS 40CC (MISCELLANEOUS) IMPLANT
TRAY FOLEY MTR SLVR 16FR STAT (SET/KITS/TRAYS/PACK) ×2 IMPLANT
TROCAR PORT AIRSEAL 5X120 (TROCAR) IMPLANT
UNDERPAD 30X36 HEAVY ABSORB (UNDERPADS AND DIAPERS) ×4 IMPLANT
WATER STERILE IRR 1000ML POUR (IV SOLUTION) ×2 IMPLANT
YANKAUER SUCT BULB TIP 10FT TU (MISCELLANEOUS) IMPLANT

## 2023-09-18 NOTE — H&P (Signed)
 Gynecologic Oncology H&P  09/18/23  Treatment History: The patient has a strong family history of multiple tumors, most notably breast cancer. 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   There have been discussions about risk reducing surgery for her within the past 5 years but secondary to medical comorbidities, surgery was deferred.   Pelvic ultrasound on 04/03/23 showed surgically absent uterus, normal cervix. Normal appearing bilateral adnexa, no adnexal masses. No free fluid.  CA-125 was 7.1.  Interval History: Doing well. No significant changes since visit in May.  Past Medical/Surgical History: Past Medical History:  Diagnosis Date   Allergic rhinitis    Allergy    Arthritis    Brain tumor (HCC)    Breast cyst 2007   Right   Cataract    surgery 07/2017   Clotting disorder (HCC)    On plavix  since stents  for artery blockages   Coronary artery disease    Depression 2006   w/ menopause   Depression screening 09/27/2021   Family history of adverse reaction to anesthesia    sister PONV   Family history of breast cancer    Family history of colon cancer    Family history of kidney cancer 01/15/2018   Family history of lung cancer    Family history of pancreatic cancer    Family history of prostate cancer    Family history of uterine cancer    GERD (gastroesophageal reflux disease)    Glaucoma    Heart murmur    History of hysterectomy, supracervical    Hyperlipidemia    Hypertension    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   MLH1 c.1410-2_1410-1delinsCC (Splice site)   Multinodular goiter    PAD (peripheral artery disease) (HCC)    LE Art US  1/18: Occluded prox-mid R SFA // ABIs 9/22: R 0.83; L 1.0   Pustular psoriasis    Tobacco user     Past Surgical History:  Procedure Laterality Date   BIOPSY  01/15/2022   Procedure: BIOPSY;  Surgeon: Wilhelmenia Aloha Raddle., MD;  Location: [facility];  Service:  Gastroenterology;;   BONE BIOPSY  06/24/2023   Procedure: BIOPSY;  Surgeon: Wilhelmenia Aloha Raddle., MD;  [facility];  Service: Gastroenterology;;   BRAIN TUMOR EXCISION  02/24/2021   at [facility]   BREAST BIOPSY Bilateral 1985   CARDIAC CATHETERIZATION     CATARACT EXTRACTION Right 07/2017   COLONOSCOPY     COLONOSCOPY WITH PROPOFOL  N/A 01/15/2022   Procedure: COLONOSCOPY WITH PROPOFOL ;  Surgeon: Wilhelmenia Aloha Raddle., MD;  Location: [facility];  Service: Gastroenterology;  Laterality: N/A;   COLONOSCOPY WITH PROPOFOL  N/A 06/24/2023   Procedure: COLONOSCOPY WITH PROPOFOL ;  Surgeon: Wilhelmenia Aloha Raddle., MD;  [facility];  Service: Gastroenterology;  Laterality: N/A;   CORONARY STENT INTERVENTION N/A 12/26/2018   Procedure: CORONARY STENT INTERVENTION;  Surgeon: Claudene Victory ORN, MD;  [facility];  Service: Cardiovascular;  Laterality: N/A;   ESOPHAGOGASTRODUODENOSCOPY (EGD) WITH PROPOFOL  N/A 01/15/2022   Procedure: ESOPHAGOGASTRODUODENOSCOPY (EGD) WITH PROPOFOL ;  Surgeon: Wilhelmenia Aloha Raddle., MD;  [facility];  Service: Gastroenterology;  Laterality: N/A;   ESOPHAGOGASTRODUODENOSCOPY (EGD) WITH PROPOFOL  N/A 06/24/2023   Procedure: ESOPHAGOGASTRODUODENOSCOPY (EGD) WITH PROPOFOL ;  Surgeon: Wilhelmenia Aloha Raddle., MD;  [facility];  Service: Gastroenterology;  Laterality: N/A;   EYE SURGERY  2019, 2020   Cataracts, eye shunt implants   LEFT HEART CATH AND CORONARY ANGIOGRAPHY N/A 12/17/2018   Procedure: LEFT HEART CATH AND  CORONARY ANGIOGRAPHY;  Surgeon: Claudene Victory ORN, MD;  [facility];  Service: Cardiovascular;  Laterality: N/A;   POLYPECTOMY  01/15/2022   Procedure: POLYPECTOMY;  Surgeon: Wilhelmenia Aloha Raddle., MD;  [facility];  Service: Gastroenterology;;   POLYPECTOMY  06/24/2023   Procedure: POLYPECTOMY, INTESTINE;  Surgeon: Wilhelmenia Aloha Raddle., MD;  Location: [facility];  Service:  Gastroenterology;;   SUPRACERVICAL ABDOMINAL HYSTERECTOMY  1986   UPPER GASTROINTESTINAL ENDOSCOPY      Family History  Problem Relation Age of Onset   Hypertension Mother    Deep vein thrombosis Mother    Alzheimer's disease Mother    Osteopenia Mother    Arthritis Mother    Varicose Veins Mother    Kidney cancer Father 12   Pneumonia Father    Hypertension Father    Arthritis Father    Early death Father    Kidney disease Father    Breast cancer Sister 25       recurrence 53   Cancer Sister    Breast cancer Sister 57       Stage 0   Cancer Sister    Breast cancer Sister 68       Stage 3, bilateral mastectomy   Cancer Sister    Early death Sister    Deep vein thrombosis Sister            Allergies Brother    Early death Brother    Heart attack Brother 2   Breast cancer Maternal Aunt 67   Prostate cancer Maternal Uncle 68   Lung cancer Maternal Uncle 60   Pancreatic cancer Paternal Aunt 23   Pancreatic cancer Paternal Aunt 59   Brain cancer Paternal Aunt    Pancreatic cancer Paternal Uncle 4   Lung cancer Paternal Uncle 55   Heart attack Maternal Grandfather    Uterine cancer Paternal Grandmother 45   Colon cancer Paternal Grandfather 26   Kidney cancer Cousin 79   Kidney cancer Cousin 52   Uterine cancer Cousin 62   Prostate cancer Cousin 57   Breast cancer Cousin 60   Thyroid  disease Neg Hx    Rectal cancer Neg Hx    Stomach cancer Neg Hx    Esophageal cancer Neg Hx    Inflammatory bowel disease Neg Hx    Liver disease Neg Hx     Social History   Socioeconomic History   Marital status: Married    Spouse name: Lorrene   Number of children: 0   Years of education: Not on file   Highest education level: Master's degree (e.g., MA, MS, MEng, MEd, MSW, MBA)  Occupational History   Occupation: Special Ed Runner, broadcasting/film/video    Comment: Dentist  Tobacco Use   Smoking status: Former    Current packs/day: 0.00    Average packs/day: 0.3 packs/day for 36.0  years (9.0 ttl pk-yrs)    Types: Cigarettes    Start date: 08/01/1981    Quit date: 08/01/2017    Years since quitting: 6.1   Smokeless tobacco: Never   Tobacco comments:    Started smoking at about 73 yrs old  Vaping Use   Vaping status: Never Used  Substance and Sexual Activity   Alcohol use: Not Currently   Drug use: No   Sexual activity: Not Currently    Partners: Male    Birth control/protection: Post-menopausal, Surgical    Comment: Hysterectomy  Other Topics Concern   Not on file  Social History Narrative  Not on file   Social Drivers of Health   Financial Resource Strain: Medium Risk (09/03/2023)   Overall Financial Resource Strain (CARDIA)    Difficulty of Paying Living Expenses: Somewhat hard  Food Insecurity: No Food Insecurity (09/03/2023)   Hunger Vital Sign    Worried About Running Out of Food in the Last Year: Never true    Ran Out of Food in the Last Year: Never true  Transportation Needs: Unmet Transportation Needs (09/03/2023)   PRAPARE - Administrator, Civil Service (Medical): Yes    Lack of Transportation (Non-Medical): No  Physical Activity: Insufficiently Active (09/03/2023)   Exercise Vital Sign    Days of Exercise per Week: 4 days    Minutes of Exercise per Session: 30 min  Stress: No Stress Concern Present (09/03/2023)   [employer] of Occupational Health - Occupational Stress Questionnaire    Feeling of Stress: Only a little  Social Connections: Moderately Integrated (09/03/2023)   Social Connection and Isolation Panel    Frequency of Communication with Friends and Family: More than three times a week    Frequency of Social Gatherings with Friends and Family: Three times a week    Attends Religious Services: More than 4 times per year    Active Member of Clubs or Organizations: No    Attends Banker Meetings: Never    Marital Status: Married    Current Medications:  Current Facility-Administered Medications:     acetaminophen  (TYLENOL ) tablet 1,000 mg, 1,000 mg, Oral, On Call to OR, Cross, Melissa D, NP   chlorhexidine  (PERIDEX) 0.12 % solution 15 mL, 15 mL, Mouth/Throat, Once **OR** Oral care mouth rinse, 15 mL, Mouth Rinse, Once, Corinne Garnette BRAVO, MD   heparin  injection 5,000 Units, 5,000 Units, Subcutaneous, 120 min pre-op, Cross, Melissa D, NP   lactated ringers  infusion, , Intravenous, Continuous, Corinne Garnette BRAVO, MD  Review of Systems: + vision problems, easy bruising/bleeding Denies appetite changes, fevers, chills, fatigue, unexplained weight changes. Denies hearing loss, neck lumps or masses, mouth sores, ringing in ears or voice changes. Denies cough or wheezing.  Denies shortness of breath. Denies chest pain or palpitations. Denies leg swelling. Denies abdominal distention, pain, blood in stools, constipation, diarrhea, nausea, vomiting, or early satiety. Denies pain with intercourse, dysuria, frequency, hematuria or incontinence. Denies hot flashes, pelvic pain, vaginal bleeding or vaginal discharge.   Denies joint pain, back pain or muscle pain/cramps. Denies itching, rash, or wounds. Denies dizziness, headaches, numbness or seizures. Denies swollen lymph nodes or glands. Denies anxiety, depression, confusion, or decreased concentration.  Physical Exam: There were no vitals filed for this visit. General: Alert, oriented, no acute distress.  HEENT: Normocephalic, atraumatic. Sclera anicteric.  Chest: Clear to auscultation bilaterally. No wheezes, rhonchi, or rales. Cardiovascular: Regular rate and rhythm, no murmurs, rubs, or gallops.  Abdomen: Normoactive bowel sounds. Soft, nondistended, nontender to palpation. No masses or hepatosplenomegaly appreciated. No palpable fluid wave.  Extremities: Grossly normal range of motion. Warm, well perfused. No edema bilaterally.   Laboratory & Radiologic Studies:    Latest Ref Rng & Units 09/06/2023    2:51 PM 03/27/2022   12:09 PM 12/18/2021     3:07 PM  CBC  WBC 4.0 - 10.5 K/uL 4.7  9.8  7.1   Hemoglobin 12.0 - 15.0 g/dL 86.3  85.7  86.0   Hematocrit 36.0 - 46.0 % 44.1  43.2  41.8   Platelets 150 - 400 K/uL 211  319.0  302  Latest Ref Rng & Units 09/06/2023    2:51 PM 08/28/2023   11:16 AM 07/10/2023    2:22 PM  BMP  Glucose 70 - 99 mg/dL 96  96  889   BUN 8 - 23 mg/dL 18  20  22    Creatinine 0.44 - 1.00 mg/dL 8.97  8.96  8.83   Sodium 135 - 145 mmol/L 140  145  140   Potassium 3.5 - 5.1 mmol/L 3.9  3.5  3.5   Chloride 98 - 111 mmol/L 104  105  101   CO2 22 - 32 mmol/L 26  29  33   Calcium  8.9 - 10.3 mg/dL 9.4  9.5  89.9    Assessment & Plan: TIMA CURET is a 73 y.o. woman with remote history of supracervical hysterectomy with more recent germline genetic testing showing Lynch syndrome.   Plan for robotic BSO, possible trachelectomy. See counseling from visit on 08/02/23.  Comer Dollar, MD  Division of Gynecologic Oncology  Department of Obstetrics and Gynecology  [facility] [place]  [facility]

## 2023-09-18 NOTE — Op Note (Signed)
 OPERATIVE NOTE  Pre-operative Diagnosis: Lynch syndrome, prior supracervical hysterectomy  Post-operative Diagnosis: same  Operation: Robotic-assisted laparoscopic bilateral salpingo-oophorectomy, trachelectomy  Surgeon: Viktoria Crank MD  Assistant Surgeon: Eleanor Epps, NP (an NP assistant was necessary for tissue manipulation, management of robotic instrumentation, retraction and positioning due to the complexity of the case and hospital policies).   Anesthesia: GET  Urine Output: 700 cc  Operative Findings: On EUA, cervix normal size and consistency. NO adnexal masses. On intra-abdominal entry, normal upper abdominal survey. Normal omentum, small and large bowel. Normal bilateral tubes/ovaries, some adhesions between the sigmoid epiploica and bilateral ovaries, mostly filmy. Normal appearing adnexa. No ascites. No significant adhesive disease in the deep pelvis involving cervix. Bladder flap easily developed.   Estimated Blood Loss:  50 cc      Total IV Fluids: see I&O flowsheet         Specimens: cervix, bilateral tubes and ovaries, pelvic washings         Complications:  None apparent; patient tolerated the procedure well.         Disposition: PACU - hemodynamically stable.  Procedure Details  The patient was seen in the Holding Room. The risks, benefits, complications, treatment options, and expected outcomes were discussed with the patient.  The patient concurred with the proposed plan, giving informed consent.  The site of surgery properly noted/marked. The patient was identified as Pamela Lowe and the procedure verified as a Robotic-assisted bilateral salpingo oophorectomy, possible trachelectomy.   After induction of anesthesia, the patient was draped and prepped in the usual sterile manner. Patient was placed in supine position after anesthesia and draped and prepped in the usual sterile manner as follows: Her arms were tucked to her side with all appropriate  precautions.  The patient was secured to the bed using padding and tape across her chest.  The patient was placed in the semi-lithotomy position in [place] stirrups.  The perineum and vagina were prepped with Betadine. The patient's abdomen was prepped with ChloraPrep and then she was draped after the prep had been allowed to dry for 3 minutes.  A Time Out was held and the above information confirmed.  The urethra was prepped with Betadine. Foley catheter was placed.  A spongestick was placed in the vagina. OG tube placement was confirmed and to suction.   Next, a 10 mm skin incision was made 1 cm below the subcostal margin in the midclavicular line.  The 5 mm Optiview port and scope was used for direct entry.  Opening pressure was under 10 mm CO2.  The abdomen was insufflated and the findings were noted as above.   At this point and all points during the procedure, the patient's intra-abdominal pressure did not exceed 15 mmHg. Next, an 8 mm skin incision was made superior to the umbilicus and a right and left port were placed about 8 cm lateral to the robot port on the right and left side.  A fourth arm was placed on the right.  The 5 mm assist trocar was exchanged for a 12 mm airseal port. All ports were placed under direct visualization.  The patient was placed in steep Trendelenburg.  Bowel was folded away into the upper abdomen.  The robot was docked in the normal manner.  Pelvic washings were collected. Adhesions between each ovary and the sigmoid epiploica were lysed using sharp dissection.   The right and left peritoneum were opened parallel to the IP ligament to open the retroperitoneal spaces bilaterally. The  ureter was noted to be on the medial leaf of the broad ligament.  The peritoneum above the ureter was incised and stretched and the infundibulopelvic ligament was skeletonized, cauterized and cut.  The distal portion of the adnexa was separated from the peritoneum with monopolar electrocautery.  Each adnexa was placed in a bag.   Attention was then turned to the cervix. The spongestick was replaced by an EEA sizer in the vagina. The posterior peritoneum was taken down to the level of the EEA sizer.  The anterior peritoneum was also taken down.  The bladder flap was created to the level of the EEA sizer. The uterine artery on the right side was skeletonized, cauterized and cut in the normal manner.  A similar procedure was performed on the left.  The colpotomy was made on the EEA sizer and the cervix was amputated and delivered through the vagina.  Pedicles were inspected and excellent hemostasis was achieved.    Both adnexa were delivered through the vagina within their endocatch bags.   The colpotomy at the vaginal cuff was closed with 0 Vicryl with a figure of eight stitch at each apex and 0 V-Loc to close the midportion of the cuff in a running manner.  Irrigation was used and excellent hemostasis was achieved.  At this point in the procedure was completed.  Robotic instruments were removed under direct visulaization.  The robot was undocked. The fascia at the 12 mm port was closed with 0 Vicryl using a PMI fascial closure device under direct visualization.  The subcuticular tissue was closed with 4-0 Vicryl and the skin was closed with 4-0 Monocryl in a subcuticular manner.  Dermabond was applied.    The vagina was swabbed with minimal bleeding noted. Foley catheter was removed.  All sponge, lap and needle counts were correct x  3.   The patient was transferred to the recovery room in stable condition.  Comer Dollar, MD

## 2023-09-18 NOTE — Anesthesia Procedure Notes (Addendum)
 Procedure Name: Intubation Date/Time: 09/18/2023 8:51 AM  Performed by: Augusta Daved SAILOR, CRNAPre-anesthesia Checklist: Patient identified, Emergency Drugs available, Suction available and Patient being monitored Patient Re-evaluated:Patient Re-evaluated prior to induction Oxygen Delivery Method: Circle System Utilized Preoxygenation: Pre-oxygenation with 100% oxygen Induction Type: IV induction Ventilation: Mask ventilation without difficulty and Oral airway inserted - appropriate to patient size Laryngoscope Size: Cleotilde and 2 Grade View: Grade II Tube type: Oral Tube size: 7.0 mm Number of attempts: 1 Airway Equipment and Method: Stylet, Oral airway and Patient positioned with wedge pillow Placement Confirmation: ETT inserted through vocal cords under direct vision, positive ETCO2 and breath sounds checked- equal and bilateral Secured at: 22 (at the lip) cm Tube secured with: Tape Dental Injury: Teeth and Oropharynx as per pre-operative assessment  Comments: No change to dentition after dl/intubation. Pt. Had pre-existing chip to upper front right tooth prior to entering the OR.

## 2023-09-18 NOTE — Anesthesia Postprocedure Evaluation (Signed)
 Anesthesia Post Note  Patient: Pamela Lowe  Procedure(s) Performed: SALPINGO-OOPHORECTOMY, BILATERAL, ROBOT-ASSISTED (Bilateral: Abdomen) TRACHELECTOMY, ROBOT ASSIST (Cervix)     Patient location during evaluation: PACU Anesthesia Type: General Level of consciousness: awake and alert Pain management: pain level controlled Vital Signs Assessment: post-procedure vital signs reviewed and stable Respiratory status: spontaneous breathing, nonlabored ventilation, respiratory function stable and patient connected to nasal cannula oxygen Cardiovascular status: blood pressure returned to baseline and stable Postop Assessment: no apparent nausea or vomiting Anesthetic complications: no   No notable events documented.  Last Vitals:  Vitals:   09/18/23 1130 09/18/23 1145  BP: (!) 142/99 (!) 152/77  Pulse: (!) 50 (!) 53  Resp: 10   Temp:    SpO2: 92% 92%    Last Pain:  Vitals:   09/18/23 1145  TempSrc:   PainSc: 3                  Joell Buerger L Jadee Golebiewski

## 2023-09-18 NOTE — Discharge Instructions (Addendum)
 09/18/2023  I removed your tubes and ovaries as well as your cervix. Luckily, there was very little scar tissue from your prior hysterectomy.  Activity: 1. Be up and out of the bed during the day.  Take a nap if needed.  You may walk up steps but be careful and use the hand rail.  Stair climbing will tire you more than you think, you may need to stop part way and rest.   2. No lifting or straining for 6 weeks.  3. Do Not drive if you are taking narcotic pain medicine.  4. Shower daily.  Use soap and water on your incision and pat dry; don't rub.   5. No sexual activity and nothing in the vagina for 12 weeks.  Medications:  - Take ibuprofen and tylenol  first line for pain control. Take these regularly (every 6 hours) to decrease the build up of pain.  - If necessary, for severe pain not relieved by ibuprofen, take tramadol.  - While taking tramadol you should take sennakot every night to reduce the likelihood of constipation. If this causes diarrhea, stop its use.  Diet: 1. Low sodium Heart Healthy Diet is recommended.  2. It is safe to use a laxative if you have difficulty moving your bowels.   Wound Care: 1. Keep clean and dry.  Shower daily.  Reasons to call the Doctor:  Fever - Oral temperature greater than 100.4 degrees Fahrenheit Foul-smelling vaginal discharge Difficulty urinating Nausea and vomiting Increased pain at the site of the incision that is unrelieved with pain medicine. Difficulty breathing with or without chest pain New calf pain especially if only on one side Sudden, continuing increased vaginal bleeding with or without clots.   Follow-up: 1. See Comer Dollar in 10/10/23.  Contacts: For questions or concerns you should contact:  Dr. Comer Dollar at [phone] After hours and on week-ends call [phone] and ask to speak to the physician on call for Gynecologic Oncology

## 2023-09-18 NOTE — Transfer of Care (Signed)
 Immediate Anesthesia Transfer of Care Note  Patient: Pamela Lowe  Procedure(s) Performed: SALPINGO-OOPHORECTOMY, BILATERAL, ROBOT-ASSISTED (Bilateral: Abdomen) TRACHELECTOMY, ROBOT ASSIST (Cervix)  Patient Location: PACU  Anesthesia Type:General  Level of Consciousness: drowsy and patient cooperative  Airway & Oxygen Therapy: Patient Spontanous Breathing and Patient connected to face mask oxygen  Post-op Assessment: Report given to RN and Post -op Vital signs reviewed and stable  Post vital signs: Reviewed and stable  Last Vitals:  Vitals Value Taken Time  BP 123/77 09/18/23 10:42  Temp    Pulse 69 09/18/23 10:45  Resp 15 09/18/23 10:45  SpO2 100 % 09/18/23 10:45  Vitals shown include unfiled device data.  Last Pain:  Vitals:   09/18/23 0728  TempSrc: Oral  PainSc:          Complications: No notable events documented.

## 2023-09-19 ENCOUNTER — Telehealth: Payer: Self-pay | Admitting: *Deleted

## 2023-09-19 ENCOUNTER — Encounter (HOSPITAL_COMMUNITY): Payer: Self-pay | Admitting: Gynecologic Oncology

## 2023-09-19 NOTE — Telephone Encounter (Signed)
 Spoke with Ms. Kaneko this morning. She states she is eating, drinking and urinating well. She has not had a BM yet but is passing gas. She is taking senokot as prescribed and encouraged her to drink plenty of water. She denies fever or chills. Incisions are dry and intact. She rates her pain 1/10. Pt took tramadol twice yesterday, and hasn't needed to take anything today. Pt states, I feel wonderful and I'm not having any pain  Instructed to call office with any fever, chills, purulent drainage, uncontrolled pain or any other questions or concerns. Patient verbalizes understanding.   Pt aware of post op appointments as well as the office number [phone] and after hours number [phone] to call if she has any questions or concerns

## 2023-09-25 LAB — CYTOLOGY - NON PAP

## 2023-09-26 LAB — SURGICAL PATHOLOGY

## 2023-09-27 ENCOUNTER — Ambulatory Visit: Payer: Self-pay | Admitting: Gynecologic Oncology

## 2023-10-10 ENCOUNTER — Inpatient Hospital Stay: Attending: Gynecologic Oncology | Admitting: Gynecologic Oncology

## 2023-10-10 ENCOUNTER — Encounter: Payer: Self-pay | Admitting: Gynecologic Oncology

## 2023-10-10 VITALS — BP 130/89 | HR 60 | Temp 98.1°F | Resp 18 | Wt 192.2 lb

## 2023-10-10 DIAGNOSIS — Z9079 Acquired absence of other genital organ(s): Secondary | ICD-10-CM

## 2023-10-10 DIAGNOSIS — Z148 Genetic carrier of other disease: Secondary | ICD-10-CM

## 2023-10-10 DIAGNOSIS — Z1509 Genetic susceptibility to other malignant neoplasm: Secondary | ICD-10-CM

## 2023-10-10 DIAGNOSIS — Z90722 Acquired absence of ovaries, bilateral: Secondary | ICD-10-CM

## 2023-10-10 DIAGNOSIS — Z803 Family history of malignant neoplasm of breast: Secondary | ICD-10-CM

## 2023-10-10 DIAGNOSIS — Z1589 Genetic susceptibility to other disease: Secondary | ICD-10-CM

## 2023-10-10 NOTE — Progress Notes (Signed)
 Gynecologic Oncology Return Clinic Visit  10/10/23  Reason for Visit: follow-up  Treatment History: The patient has a strong family history of multiple tumors, most notably breast cancer. 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   There have been discussions about risk reducing surgery for her within the past 5 years but secondary to medical comorbidities, surgery was deferred.   Pelvic ultrasound on 04/03/23 showed surgically absent uterus, normal cervix. Normal appearing bilateral adnexa, no adnexal masses. No free fluid.  CA-125 was 7.1.  09/18/23: Robotic-assisted laparoscopic bilateral salpingo-oophorectomy, trachelectomy   Interval History: Patient reports doing very well.  Has had some mild right sided intermittent pain.  Endorses normal bowel function, still taking Senokot and Metamucil.  Denies any urinary symptoms after the first several days (had some burning immediately after surgery).  Denies any vaginal bleeding.  Past Medical/Surgical History: Past Medical History:  Diagnosis Date   Allergic rhinitis    Allergy    Arthritis    Brain tumor (HCC)    Breast cyst 2007   Right   Cataract    surgery 07/2017   Clotting disorder (HCC)    On plavix  since stents  for artery blockages   Coronary artery disease    Depression 2006   w/ menopause   Depression screening 09/27/2021   Family history of adverse reaction to anesthesia    sister PONV   Family history of breast cancer    Family history of colon cancer    Family history of kidney cancer 01/15/2018   Family history of lung cancer    Family history of pancreatic cancer    Family history of prostate cancer    Family history of uterine cancer    GERD (gastroesophageal reflux disease)    Glaucoma    Heart murmur    History of hysterectomy, supracervical    Hyperlipidemia    Hypertension    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   MLH1 [id number]  (Splice site)   Multinodular goiter    PAD (peripheral artery disease) (HCC)    LE Art US  1/18: Occluded prox-mid R SFA // ABIs 9/22: R 0.83; L 1.0   Pustular psoriasis    Tobacco user     Past Surgical History:  Procedure Laterality Date   BIOPSY  01/15/2022   Procedure: BIOPSY;  Surgeon: Wilhelmenia Aloha Raddle., MD;  Location: [facility];  Service: Gastroenterology;;   BONE BIOPSY  06/24/2023   Procedure: BIOPSY;  Surgeon: Wilhelmenia Aloha Raddle., MD;  Location: [facility];  Service: Gastroenterology;;   BRAIN TUMOR EXCISION  02/24/2021   at [facility]   BREAST BIOPSY Bilateral 1985   CARDIAC CATHETERIZATION     CATARACT EXTRACTION Right 07/2017   COLONOSCOPY     COLONOSCOPY WITH PROPOFOL  N/A 01/15/2022   Procedure: COLONOSCOPY WITH PROPOFOL ;  Surgeon: Wilhelmenia Aloha Raddle., MD;  Location: [facility];  Service: Gastroenterology;  Laterality: N/A;   COLONOSCOPY WITH PROPOFOL  N/A 06/24/2023   Procedure: COLONOSCOPY WITH PROPOFOL ;  Surgeon: Mansouraty, Aloha Raddle., MD;  Location: [facility];  Service: Gastroenterology;  Laterality: N/A;   CORONARY STENT INTERVENTION N/A 12/26/2018   Procedure: CORONARY STENT INTERVENTION;  Surgeon: Claudene Victory ORN, MD;  Location: [facility];  Service: Cardiovascular;  Laterality: N/A;   ESOPHAGOGASTRODUODENOSCOPY (EGD) WITH PROPOFOL  N/A 01/15/2022   Procedure: ESOPHAGOGASTRODUODENOSCOPY (EGD) WITH PROPOFOL ;  Surgeon: Wilhelmenia Aloha Raddle., MD;  Location: [facility];  Service: Gastroenterology;  Laterality: N/A;   ESOPHAGOGASTRODUODENOSCOPY (EGD) WITH PROPOFOL  N/A 06/24/2023  Procedure: ESOPHAGOGASTRODUODENOSCOPY (EGD) WITH PROPOFOL ;  Surgeon: Wilhelmenia Aloha Raddle., MD;  Location: [facility];  Service: Gastroenterology;  Laterality: N/A;   EYE SURGERY  2019, 2020   Cataracts, eye shunt implants   LEFT HEART CATH AND CORONARY ANGIOGRAPHY N/A 12/17/2018   Procedure: LEFT HEART CATH AND CORONARY ANGIOGRAPHY;  Surgeon: Claudene Victory ORN,  MD;  Location: [facility];  Service: Cardiovascular;  Laterality: N/A;   POLYPECTOMY  01/15/2022   Procedure: POLYPECTOMY;  Surgeon: Wilhelmenia Aloha Raddle., MD;  Location: [facility];  Service: Gastroenterology;;   POLYPECTOMY  06/24/2023   Procedure: POLYPECTOMY, INTESTINE;  Surgeon: Wilhelmenia Aloha Raddle., MD;  Location: [facility];  Service: Gastroenterology;;   ROBOT ASSISTED TRACHELECTOMY N/A 09/18/2023   Procedure: TRACHELECTOMY, ROBOT ASSIST;  Surgeon: Viktoria Comer SAUNDERS, MD;  Location: [facility];  Service: Gynecology;  Laterality: N/A;  ROBOT ASSISTED TRACHELECTOMY   ROBOTIC ASSISTED BILATERAL SALPINGO OOPHERECTOMY Bilateral 09/18/2023   Procedure: SALPINGO-OOPHORECTOMY, BILATERAL, ROBOT-ASSISTED;  Surgeon: Viktoria Comer SAUNDERS, MD;  Location: [facility];  Service: Gynecology;  Laterality: Bilateral;   SUPRACERVICAL ABDOMINAL HYSTERECTOMY  1986   UPPER GASTROINTESTINAL ENDOSCOPY      Family History  Problem Relation Age of Onset   Hypertension Mother    Deep vein thrombosis Mother    Alzheimer's disease Mother    Osteopenia Mother    Arthritis Mother    Varicose Veins Mother    Kidney cancer Father 12   Pneumonia Father    Hypertension Father    Arthritis Father    Early death Father    Kidney disease Father    Breast cancer Sister 64       recurrence 41   Cancer Sister    Breast cancer Sister 55       Stage 0   Cancer Sister    Breast cancer Sister 37       Stage 3, bilateral mastectomy   Cancer Sister    Early death Sister    Deep vein thrombosis Sister            Allergies Brother    Early death Brother    Heart attack Brother 30   Breast cancer Maternal Aunt 67   Prostate cancer Maternal Uncle 68   Lung cancer Maternal Uncle 60   Pancreatic cancer Paternal Aunt 77   Pancreatic cancer Paternal Aunt 14   Brain cancer Paternal Aunt    Pancreatic cancer Paternal Uncle 26   Lung cancer Paternal Uncle 34   Heart attack Maternal Grandfather    Uterine cancer  Paternal Grandmother 17   Colon cancer Paternal Grandfather 18   Kidney cancer Cousin 6   Kidney cancer Cousin 52   Uterine cancer Cousin 62   Prostate cancer Cousin 57   Breast cancer Cousin 60   Thyroid  disease Neg Hx    Rectal cancer Neg Hx    Stomach cancer Neg Hx    Esophageal cancer Neg Hx    Inflammatory bowel disease Neg Hx    Liver disease Neg Hx     Social History   Socioeconomic History   Marital status: Married    Spouse name: Lorrene   Number of children: 0   Years of education: Not on file   Highest education level: Master's degree (e.g., MA, MS, MEng, MEd, MSW, MBA)  Occupational History   Occupation: Special Ed Teacher    Comment: Dentist  Tobacco Use   Smoking status: Former    Current packs/day: 0.00  Average packs/day: 0.3 packs/day for 36.0 years (9.0 ttl pk-yrs)    Types: Cigarettes    Start date: 08/01/1981    Quit date: 08/01/2017    Years since quitting: 6.1   Smokeless tobacco: Never   Tobacco comments:    Started smoking at about 73 yrs old  Vaping Use   Vaping status: Never Used  Substance and Sexual Activity   Alcohol use: Not Currently   Drug use: No   Sexual activity: Not Currently    Partners: Male    Birth control/protection: Post-menopausal, Surgical    Comment: Hysterectomy  Other Topics Concern   Not on file  Social History Narrative   Not on file   Social Drivers of Health   Financial Resource Strain: Medium Risk (09/03/2023)   Overall Financial Resource Strain (CARDIA)    Difficulty of Paying Living Expenses: Somewhat hard  Food Insecurity: No Food Insecurity (09/03/2023)   Hunger Vital Sign    Worried About Running Out of Food in the Last Year: Never true    Ran Out of Food in the Last Year: Never true  Transportation Needs: Unmet Transportation Needs (09/03/2023)   PRAPARE - Administrator, Civil Service (Medical): Yes    Lack of Transportation (Non-Medical): No  Physical Activity: Insufficiently  Active (09/03/2023)   Exercise Vital Sign    Days of Exercise per Week: 4 days    Minutes of Exercise per Session: 30 min  Stress: No Stress Concern Present (09/03/2023)   [employer] of Occupational Health - Occupational Stress Questionnaire    Feeling of Stress: Only a little  Social Connections: Moderately Integrated (09/03/2023)   Social Connection and Isolation Panel    Frequency of Communication with Friends and Family: More than three times a week    Frequency of Social Gatherings with Friends and Family: Three times a week    Attends Religious Services: More than 4 times per year    Active Member of Clubs or Organizations: No    Attends Banker Meetings: Never    Marital Status: Married    Current Medications:  Current Outpatient Medications:    aspirin  81 MG EC tablet, Take 81 mg by mouth every evening. , Disp: , Rfl:    B Complex Vitamins (VITAMIN B COMPLEX) TABS, Take 1 tablet by mouth daily., Disp: , Rfl:    betamethasone , augmented, (DIPROLENE ) 0.05 % lotion, Apply 1 Application topically daily as needed (psoriatic arthritis)., Disp: , Rfl:    brimonidine  (ALPHAGAN ) 0.2 % ophthalmic solution, Place 1 drop into both eyes 3 (three) times daily. , Disp: , Rfl:    calcipotriene (DOVONOX) 0.005 % cream, Apply 1 Application topically daily as needed (psoriatic arthritis)., Disp: , Rfl:    CALCIUM -VITAMIN D  PO, Take 1 tablet by mouth daily., Disp: , Rfl:    Cholecalciferol (VITAMIN D ) 50 MCG (2000 UT) tablet, Take 2,000 Units by mouth daily., Disp: , Rfl:    clopidogrel  (PLAVIX ) 75 MG tablet, Take 1 tablet (75 mg total) by mouth daily., Disp: 90 tablet, Rfl: 3   Collagen-Vitamin C-Biotin (COLLAGEN PO), Take 1 Scoop by mouth daily., Disp: , Rfl:    dorzolamide -timolol (COSOPT) 22.3-6.8 MG/ML ophthalmic solution, Place 1 drop into both eyes 2 (two) times daily., Disp: , Rfl:    famotidine (PEPCID) 20 MG tablet, Take 20 mg by mouth 2 (two) times daily., Disp: , Rfl:     folic acid  (FOLVITE ) 1 MG tablet, Take 1 mg by mouth daily. ,  Disp: , Rfl:    hydrochlorothiazide  (MICROZIDE ) 12.5 MG capsule, TAKE 1 CAPSULE(12.5 MG) BY MOUTH DAILY, Disp: 30 capsule, Rfl: 0   hydrocortisone  (ANUSOL -HC) 2.5 % rectal cream, Place 1 Application rectally 2 (two) times daily., Disp: 30 g, Rfl: 2   leflunomide (ARAVA) 20 MG tablet, Take 20 mg by mouth daily., Disp: , Rfl:    metoprolol  succinate (TOPROL -XL) 25 MG 24 hr tablet, Take 1 tablet (25 mg total) by mouth daily., Disp: 90 tablet, Rfl: 3   minoxidil (LONITEN) 2.5 MG tablet, Take 2.5 mg by mouth daily., Disp: , Rfl:    Multiple Vitamins-Minerals (MULTIVITAMIN WITH MINERALS) tablet, Take 1 tablet by mouth daily., Disp: , Rfl:    REPATHA  SURECLICK 140 MG/ML SOAJ, ADMINISTER 1 ML UNDER THE SKIN EVERY 14 DAYS, Disp: 6 mL, Rfl: 1   rosuvastatin  (CRESTOR ) 20 MG tablet, TAKE 1 TABLET BY MOUTH DAILY, Disp: 90 tablet, Rfl: 3   senna-docusate (SENOKOT-S) 8.6-50 MG tablet, Take 2 tablets by mouth at bedtime. For AFTER surgery, do not take if having diarrhea, Disp: 30 tablet, Rfl: 0   SIMPONI  ARIA 50 MG/4ML SOLN injection, Inject 50 mg into the vein every 8 (eight) weeks., Disp: , Rfl:    traMADol  (ULTRAM ) 50 MG tablet, Take 1 tablet (50 mg total) by mouth every 6 (six) hours as needed for severe pain (pain score 7-10). For AFTER surgery only, do not take and drive, Disp: 10 tablet, Rfl: 0   VYZULTA 0.024 % SOLN, Place 1 drop into the right eye at bedtime., Disp: , Rfl:   Review of Systems: + fatigue, hot flashes Denies appetite changes, fevers, chills, unexplained weight changes. Denies hearing loss, neck lumps or masses, mouth sores, ringing in ears or voice changes. Denies cough or wheezing.  Denies shortness of breath. Denies chest pain or palpitations. Denies leg swelling. Denies abdominal distention, pain, blood in stools, constipation, diarrhea, nausea, vomiting, or early satiety. Denies pain with intercourse, dysuria,  frequency, hematuria or incontinence. Denies pelvic pain, vaginal bleeding or vaginal discharge.   Denies joint pain, back pain or muscle pain/cramps. Denies itching, rash, or wounds. Denies dizziness, headaches, numbness or seizures. Denies swollen lymph nodes or glands, denies easy bruising or bleeding. Denies anxiety, depression, confusion, or decreased concentration.  Physical Exam: BP 130/89 (BP Location: Left Arm, Patient Position: Sitting)   Pulse 60   Temp 98.1 F (36.7 C) (Oral)   Resp 18   Wt 192 lb 3.2 oz (87.2 kg)   LMP 03/19/1998 Comment: h/o supracervical hysterectomy  SpO2 100%   BMI 30.10 kg/m  General: Alert, oriented, no acute distress. HEENT: Posterior oropharynx clear, sclera anicteric. Chest: unlabored breathing on room air. Abdomen: soft, nontender.  Normoactive bowel sounds.  No masses or hepatosplenomegaly appreciated.  Well-healed incisions. GU: Normal appearing external genitalia without erythema, excoriation, or lesions.  Speculum exam reveals cuff intact, suture visible, no bleeding or discharge.  Bimanual exam reveals no fluctuance or tenderness to palpation.    Laboratory & Radiologic Studies: A. FALLOPIAN TUBE, OVARY, RIGHT, SALPINGO-OOPHORECTOMY:  - Ovarian stromal hyperthecosis (see comment).  - Benign paratubal cysts.   B. FALLOPIAN TUBE, OVARY, LEFT, SALPINGO-OOPHORECTOMY:  - Ovarian stromal hyperthecosis (see comment).  - Benign paratubal cysts.   C. CERVIX, TRACHELECTOMY:  - Benign cervix with squamous metaplasia.   Comment: The ovaries demonstrate scattered bilateral nodules of  polygonal cells with eosinophilic cytoplasm.  Immunohistochemical stains  demonstrate strong staining with inhibin and calretinin, while S100 and  GATA3 are negative.  These results are consistent with the above  diagnosis.   Assessment & Plan: Pamela Lowe is a 73 y.o. woman s/p robotic BSO and trachelectomy in the setting of Lynch syndrome.  Patient is  doing very well after surgery.  Reviewed continued expectations and restrictions.  She was given a copy of her pathology report.  We discussed that hyperthecosis can be associated with production of androgens.  The patient has experienced some hair loss for which she was started on minoxidil.  This has subsequently caused some facial hair growth.  It is hard to know if the symptoms are both related to androgen production but if she were to start noticing increased hair growth, it is likely that she could stop the minoxidil.  18 minutes of total time was spent for this patient encounter, including preparation, face-to-face counseling with the patient and coordination of care, and documentation of the encounter.  Comer Dollar, MD  Division of Gynecologic Oncology  Department of Obstetrics and Gynecology  [facility]

## 2023-10-10 NOTE — Patient Instructions (Signed)
 It was great to see you.  You are healing very well from surgery!  Please remember, no heavy lifting for 6 weeks and nothing in the vagina for at least 12 weeks.  Please do not hesitate to reach out if you need anything in the future.

## 2023-10-11 DIAGNOSIS — H4042X2 Glaucoma secondary to eye inflammation, left eye, moderate stage: Secondary | ICD-10-CM | POA: Diagnosis not present

## 2023-10-11 DIAGNOSIS — Z961 Presence of intraocular lens: Secondary | ICD-10-CM | POA: Diagnosis not present

## 2023-10-11 DIAGNOSIS — H4041X3 Glaucoma secondary to eye inflammation, right eye, severe stage: Secondary | ICD-10-CM | POA: Diagnosis not present

## 2023-10-11 DIAGNOSIS — H209 Unspecified iridocyclitis: Secondary | ICD-10-CM | POA: Diagnosis not present

## 2023-10-15 ENCOUNTER — Ambulatory Visit (INDEPENDENT_AMBULATORY_CARE_PROVIDER_SITE_OTHER): Admitting: Acute Care

## 2023-10-15 ENCOUNTER — Encounter: Payer: Self-pay | Admitting: Acute Care

## 2023-10-15 VITALS — BP 133/86 | HR 55 | Temp 98.1°F | Ht 67.0 in | Wt 200.2 lb

## 2023-10-15 DIAGNOSIS — Z1509 Genetic susceptibility to other malignant neoplasm: Secondary | ICD-10-CM | POA: Diagnosis not present

## 2023-10-15 DIAGNOSIS — R918 Other nonspecific abnormal finding of lung field: Secondary | ICD-10-CM

## 2023-10-15 DIAGNOSIS — Z87891 Personal history of nicotine dependence: Secondary | ICD-10-CM

## 2023-10-15 NOTE — Patient Instructions (Signed)
 It is good to see you today. Your CT Chest shows stable lung nodules.  This is great news. We will do a follow up LDCT in 08/2024 to continue to follow the scan.  You will get a call closer to the time to get this scheduled.  You will follow up with me after the scan to review results.  Call for any blood in your sputum or unexplained weight loss., so we can see you sooner.  Call if you need us . Please contact office for sooner follow up if symptoms do not improve or worsen or seek emergency care

## 2023-10-15 NOTE — Progress Notes (Signed)
 History of Present Illness Pamela Lowe is a 73 y.o. female former smoker referred for an abnormal lung cancer screening. She will be followed by Dr. Shelah.   10/15/2023 Pt. Presents for  follow up after an abnormal lung cancer screening.We have been  following a right upper lobe nodule on her lung cancer screening scan. She denies any breathing issues.   She has been under surveillance for a slowly enlarging 10 mm solid right upper lobe nodule. A previous PET scan was inconclusive due to the nodule's size. Follow-up CT scans have been conducted, with the most recent showing an 11 mm lymph node and tiny nodules unchanged from prior imaging. No weight loss, hemoptysis, or new respiratory symptoms are reported.  She has a significant family history of breast cancer, with three sisters affected and one deceased. Genetic testing confirmed Lynch syndrome, leading to prophylactic removal of her ovaries, tubes, and cervix. A hysterectomy was performed in the early 1990s, leaving the ovaries intact until the recent surgery.  In 2012, she experienced shortness of breath and chest pain, leading to a stress test and the discovery of three blocked coronary arteries, for which she received stents.  She has a history of a meningioma, which was surgically removed at [facility]. The tumor was pressing on her optic nerve, affecting her vision. We have reviewed her most recent CT Chest. The lung nodules are stable. This is great news. Plan will be for a follow up LDCT 08/2024 to continue to monitor those lung nodules. Pt.is in agreement with this plan.     Test Results: Low Dose CT Chest  Lung-RADS 2, benign appearance or behavior. Continue annual screening with low-dose chest CT without contrast in 12 months. 2. Aortic atherosclerosis (ICD10-I70.0). Coronary artery calcification. 3.  Emphysema (ICD10-J43.9).  05/04/2022 PET No hypermetabolic correlate for the right upper lobe 9 mm pulmonary  nodule, which is only subtly apparent, somewhat linear, and at the low end of PET resolution. Recommend chest CT follow-up at 3 months. 2. Left adrenal hypermetabolism is without CT correlate and likely physiologic. Recommend attention on follow-up. 3. Incidental findings, including: Coronary artery atherosclerosis. Aortic Atherosclerosis (ICD10-I70.0). Probable right femoral head avascular necrosis. Independently reviewed by me   Thyroid  US  05/11/2022 Nodule 4: 1.8 x 1.7 x 1.2 cm spongiform fall in the inferior left thyroid  lobe is not significantly changed in size since prior examination. This nodule does not meet criteria for FNA or imaging follow-up.   Remaining bilateral thyroid  nodules are subcentimeter and do not meet criteria for FNA or imaging surveillance.   IMPRESSION: Bilateral thyroid  nodules which do not meet criteria for FNA or imaging follow-up. Independently reviewed by me.            Latest Ref Rng & Units 09/06/2023    2:51 PM 03/27/2022   12:09 PM 12/18/2021    3:07 PM  CBC  WBC 4.0 - 10.5 K/uL 4.7  9.8  7.1   Hemoglobin 12.0 - 15.0 g/dL 86.3  85.7  86.0   Hematocrit 36.0 - 46.0 % 44.1  43.2  41.8   Platelets 150 - 400 K/uL 211  319.0  302        Latest Ref Rng & Units 09/06/2023    2:51 PM 08/28/2023   11:16 AM 07/10/2023    2:22 PM  BMP  Glucose 70 - 99 mg/dL 96  96  889   BUN 8 - 23 mg/dL 18  20  22    Creatinine 0.44 -  1.00 mg/dL 8.97  8.96  8.83   Sodium 135 - 145 mmol/L 140  145  140   Potassium 3.5 - 5.1 mmol/L 3.9  3.5  3.5   Chloride 98 - 111 mmol/L 104  105  101   CO2 22 - 32 mmol/L 26  29  33   Calcium  8.9 - 10.3 mg/dL 9.4  9.5  89.9     BNP No results found for: BNP  ProBNP    Component Value Date/Time   PROBNP 37 05/22/2018 1246    PFT No results found for: FEV1PRE, FEV1POST, FVCPRE, FVCPOST, TLC, DLCOUNC, PREFEV1FVCRT, PSTFEV1FVCRT  No results found.   Past medical hx Past Medical History:  Diagnosis  Date   Allergic rhinitis    Allergy    Arthritis    Brain tumor (HCC)    Breast cyst 2007   Right   Cataract    surgery 07/2017   Clotting disorder (HCC)    On plavix  since stents  for artery blockages   Coronary artery disease    Depression 2006   w/ menopause   Depression screening 09/27/2021   Family history of adverse reaction to anesthesia    sister PONV   Family history of breast cancer    Family history of colon cancer    Family history of kidney cancer 01/15/2018   Family history of lung cancer    Family history of pancreatic cancer    Family history of prostate cancer    Family history of uterine cancer    GERD (gastroesophageal reflux disease)    Glaucoma    Heart murmur    History of hysterectomy, supracervical    Hyperlipidemia    Hypertension    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   MLH1 [id number] (Splice site)   Multinodular goiter    PAD (peripheral artery disease) (HCC)    LE Art US  1/18: Occluded prox-mid R SFA // ABIs 9/22: R 0.83; L 1.0   Pustular psoriasis    Tobacco user      Social History   Tobacco Use   Smoking status: Former    Current packs/day: 0.00    Average packs/day: 0.3 packs/day for 36.0 years (9.0 ttl pk-yrs)    Types: Cigarettes    Start date: 08/01/1981    Quit date: 08/01/2017    Years since quitting: 6.2   Smokeless tobacco: Never   Tobacco comments:    Started smoking at about 73 yrs old  Vaping Use   Vaping status: Never Used  Substance Use Topics   Alcohol use: Not Currently   Drug use: No    Ms.Sayler reports that she quit smoking about 6 years ago. Her smoking use included cigarettes. She started smoking about 42 years ago. She has a 9 pack-year smoking history. She has never used smokeless tobacco. She reports that she does not currently use alcohol. She reports that she does not use drugs.  Tobacco Cessation: 58 pack year smoking history quit 2019  Past surgical hx, Family hx, Social hx all  reviewed.  Current Outpatient Medications on File Prior to Visit  Medication Sig   ANUCORT-HC  25 MG suppository Place 25 mg rectally 2 (two) times daily.   aspirin  81 MG EC tablet Take 81 mg by mouth every evening.    B Complex Vitamins (VITAMIN B COMPLEX) TABS Take 1 tablet by mouth daily.   betamethasone , augmented, (DIPROLENE ) 0.05 % lotion Apply 1 Application topically daily as needed (psoriatic arthritis).   Biotin 1000 MCG  tablet Take 1,000 mcg by mouth daily.   brimonidine  (ALPHAGAN ) 0.2 % ophthalmic solution Place 1 drop into both eyes 3 (three) times daily.    calcipotriene (DOVONOX) 0.005 % cream Apply 1 Application topically daily as needed (psoriatic arthritis).   CALCIUM -VITAMIN D  PO Take 1 tablet by mouth daily.   Cholecalciferol (VITAMIN D ) 50 MCG (2000 UT) tablet Take 2,000 Units by mouth daily.   clopidogrel  (PLAVIX ) 75 MG tablet Take 1 tablet (75 mg total) by mouth daily.   Collagen-Vitamin C-Biotin (COLLAGEN PO) Take 1 Scoop by mouth daily.   dorzolamide -timolol (COSOPT) 22.3-6.8 MG/ML ophthalmic solution Place 1 drop into both eyes 2 (two) times daily.   famotidine (PEPCID) 20 MG tablet Take 20 mg by mouth 2 (two) times daily.   folic acid  (FOLVITE ) 1 MG tablet Take 1 mg by mouth daily.    hydrochlorothiazide  (MICROZIDE ) 12.5 MG capsule TAKE 1 CAPSULE(12.5 MG) BY MOUTH DAILY   hydrocortisone  (ANUSOL -HC) 2.5 % rectal cream Place 1 Application rectally 2 (two) times daily.   leflunomide (ARAVA) 20 MG tablet Take 20 mg by mouth daily.   metoprolol  succinate (TOPROL -XL) 25 MG 24 hr tablet Take 1 tablet (25 mg total) by mouth daily.   minoxidil (LONITEN) 2.5 MG tablet Take 2.5 mg by mouth daily.   Multiple Vitamins-Minerals (MULTIVITAMIN WITH MINERALS) tablet Take 1 tablet by mouth daily.   REPATHA  SURECLICK 140 MG/ML SOAJ ADMINISTER 1 ML UNDER THE SKIN EVERY 14 DAYS   rosuvastatin  (CRESTOR ) 20 MG tablet TAKE 1 TABLET BY MOUTH DAILY   SIMPONI  ARIA 50 MG/4ML SOLN injection  Inject 50 mg into the vein every 8 (eight) weeks.   VYZULTA 0.024 % SOLN Place 1 drop into the right eye at bedtime.   senna-docusate (SENOKOT-S) 8.6-50 MG tablet Take 2 tablets by mouth at bedtime. For AFTER surgery, do not take if having diarrhea (Patient not taking: Reported on 10/15/2023)   traMADol  (ULTRAM ) 50 MG tablet Take 1 tablet (50 mg total) by mouth every 6 (six) hours as needed for severe pain (pain score 7-10). For AFTER surgery only, do not take and drive (Patient not taking: Reported on 10/15/2023)   No current facility-administered medications on file prior to visit.     Allergies  Allergen Reactions   Sulfamethoxazole-Trimethoprim Other (See Comments) and Rash    Chills/achy Flu like symptoms    Conjugated Estrogens Rash   Wellbutrin [Bupropion Hcl] Rash    Review Of Systems:  Constitutional:   No  weight loss, night sweats,  Fevers, chills, fatigue, or  lassitude.  HEENT:   No headaches,  Difficulty swallowing,  Tooth/dental problems, or  Sore throat,                No sneezing, itching, ear ache, nasal congestion, post nasal drip,   CV:  No chest pain,  Orthopnea, PND, swelling in lower extremities, anasarca, dizziness, palpitations, syncope.   GI  No heartburn, indigestion, abdominal pain, nausea, vomiting, diarrhea, change in bowel habits, loss of appetite, bloody stools.   Resp: No shortness of breath with exertion or at rest.  No excess mucus, no productive cough,  No non-productive cough,  No coughing up of blood.  No change in color of mucus.  No wheezing.  No chest wall deformity  Skin: no rash or lesions.  GU: no dysuria, change in color of urine, no urgency or frequency.  No flank pain, no hematuria   MS:  No joint pain or swelling.  No decreased range  of motion.  No back pain.  Psych:  No change in mood or affect. No depression or anxiety.  No memory loss.   Vital Signs BP 133/86 (BP Location: Left Arm, Patient Position: Sitting, Cuff Size: Normal)    Pulse (!) 55   Temp 98.1 F (36.7 C) (Oral)   Ht 5' 7 (1.702 m)   Wt 200 lb 3.2 oz (90.8 kg)   LMP 03/19/1998 Comment: h/o supracervical hysterectomy  SpO2 100%   BMI 31.36 kg/m    Physical Exam:  General- No distress,  A&Ox3, very pleasant ENT: No sinus tenderness, TM clear, pale nasal mucosa, no oral exudate,no post nasal drip, no LAN Cardiac: S1, S2, regular rate and rhythm, no murmur Chest: No wheeze/ rales/ dullness; no accessory muscle use, no nasal flaring, no sternal retractions, slightly diminished per bases Abd.: Soft Non-tender, ND, BS +, Body mass index is 31.36 kg/m.  Ext: No clubbing cyanosis, edema, no obvious deformities Neuro:  normal strength, MAE x 4, A&O x 3 Skin: No rashes, warm and dry, no obvious skin lesions  Psych: normal mood and behavior   Assessment/Plan Stable RUL Pulmonary Nodule Former smoker  History of Lynch Syndrome Plan Your CT Chest shows stable lung nodules.  This is great news. We will do a follow up LDCT in 08/2024 to continue to follow the scan.  You will get a call closer to the time to get this scheduled.  You will follow up with me after the scan to review results.  Call for any blood in your sputum or unexplained weight loss., so we can see you sooner.  Call if you need us . Please contact office for sooner follow up if symptoms do not improve or worsen or seek emergency care    I spent 25 minutes dedicated to the care of this patient on the date of this encounter to include pre-visit review of records, face-to-face time with the patient discussing conditions above, post visit ordering of testing, clinical documentation with the electronic health record, making appropriate referrals as documented, and communicating necessary information to the patient's healthcare team.   Lauraine JULIANNA Lites, NP 10/15/2023  2:37 PM

## 2023-10-16 ENCOUNTER — Encounter: Payer: Self-pay | Admitting: Acute Care

## 2023-10-17 DIAGNOSIS — Z79899 Other long term (current) drug therapy: Secondary | ICD-10-CM | POA: Diagnosis not present

## 2023-10-17 DIAGNOSIS — M1990 Unspecified osteoarthritis, unspecified site: Secondary | ICD-10-CM | POA: Diagnosis not present

## 2023-10-17 DIAGNOSIS — L4050 Arthropathic psoriasis, unspecified: Secondary | ICD-10-CM | POA: Diagnosis not present

## 2023-10-17 DIAGNOSIS — M47819 Spondylosis without myelopathy or radiculopathy, site unspecified: Secondary | ICD-10-CM | POA: Diagnosis not present

## 2023-10-25 DIAGNOSIS — D443 Neoplasm of uncertain behavior of pituitary gland: Secondary | ICD-10-CM | POA: Diagnosis not present

## 2023-10-25 DIAGNOSIS — H401133 Primary open-angle glaucoma, bilateral, severe stage: Secondary | ICD-10-CM | POA: Diagnosis not present

## 2023-10-28 ENCOUNTER — Other Ambulatory Visit (HOSPITAL_BASED_OUTPATIENT_CLINIC_OR_DEPARTMENT_OTHER): Payer: Self-pay | Admitting: Cardiovascular Disease

## 2023-10-30 ENCOUNTER — Encounter: Payer: Self-pay | Admitting: Gastroenterology

## 2023-10-30 ENCOUNTER — Ambulatory Visit (INDEPENDENT_AMBULATORY_CARE_PROVIDER_SITE_OTHER): Admitting: Gastroenterology

## 2023-10-30 VITALS — BP 100/68 | HR 70 | Ht 67.0 in | Wt 194.6 lb

## 2023-10-30 DIAGNOSIS — Z1509 Genetic susceptibility to other malignant neoplasm: Secondary | ICD-10-CM

## 2023-10-30 DIAGNOSIS — K2100 Gastro-esophageal reflux disease with esophagitis, without bleeding: Secondary | ICD-10-CM

## 2023-10-30 DIAGNOSIS — Z860101 Personal history of adenomatous and serrated colon polyps: Secondary | ICD-10-CM | POA: Diagnosis not present

## 2023-10-30 MED ORDER — LANSOPRAZOLE 30 MG PO CPDR: 30.0000 mg | DELAYED_RELEASE_CAPSULE | Freq: Every day | ORAL | 3 refills | Status: DC

## 2023-10-30 NOTE — Patient Instructions (Signed)
 We have sent the following medications to your pharmacy for you to pick up at your convenience: Prevacid  30 mg daily before breakfast.   Continue Pepcid at bedtime.   Call or send Mychart message in 4-6 weeks with an update.   _______________________________________________________  If your blood pressure at your visit was 140/90 or greater, please contact your primary care physician to follow up on this.  _______________________________________________________  If you are age 72 or older, your body mass index should be between 23-30. Your Body mass index is 30.48 kg/m. If this is out of the aforementioned range listed, please consider follow up with your Primary Care Provider.  If you are age 44 or younger, your body mass index should be between 19-25. Your Body mass index is 30.48 kg/m. If this is out of the aformentioned range listed, please consider follow up with your Primary Care Provider.   ________________________________________________________  The [facility] providers would like to encourage you to use MYCHART to communicate with providers for non-urgent requests or questions.  Due to long hold times on the telephone, sending your provider a message by [facility] may be a faster and more efficient way to get a response.  Please allow 48 business hours for a response.  Please remember that this is for non-urgent requests.  _______________________________________________________  [facility] is using a team-based approach to care.  Your team is made up of your doctor and two to three APPS. Our APPS (Nurse Practitioners and Physician Assistants) work with your physician to ensure care continuity for you. They are fully qualified to address your health concerns and develop a treatment plan. They communicate directly with your gastroenterologist to care for you. Seeing the Advanced Practice Practitioners on your physician's team can help you by facilitating care more promptly,  often allowing for earlier appointments, access to diagnostic testing, procedures, and other specialty referrals.

## 2023-10-30 NOTE — Progress Notes (Unsigned)
 10/30/2023 Pamela Lowe [id number] 1950/05/14   HISTORY OF PRESENT ILLNESS:   Colonoscopy: Internal and external hemorrhoids, small polyp, diverticulosis (06/24/2023) Endoscopy: Hiatal hernia, esophageal erythema (06/24/2023)     A. STOMACH, BIOPSY:  -  Antral and oxyntic mucosa with no significant pathology.  -  No Helicobacter pylori organisms identified on HE stained slide.   B. COLON, ASCENDING, POLYPECTOMY:  -  Tubular adenoma.    Past Medical History:  Diagnosis Date   Allergic rhinitis    Allergy    Arthritis    Brain tumor (HCC)    Breast cyst 2007   Right   Cataract    surgery 07/2017   Clotting disorder (HCC)    On plavix  since stents  for artery blockages   Coronary artery disease    Depression 2006   w/ menopause   Depression screening 09/27/2021   Family history of adverse reaction to anesthesia    sister PONV   Family history of breast cancer    Family history of colon cancer    Family history of kidney cancer 01/15/2018   Family history of lung cancer    Family history of pancreatic cancer    Family history of prostate cancer    Family history of uterine cancer    GERD (gastroesophageal reflux disease)    Glaucoma    Heart murmur    History of hysterectomy, supracervical    Hyperlipidemia    Hypertension    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   MLH1 c.1410-2_1410-1delinsCC (Splice site)   Multinodular goiter    PAD (peripheral artery disease) (HCC)    LE Art US  1/18: Occluded prox-mid R SFA // ABIs 9/22: R 0.83; L 1.0   Pustular psoriasis    Tobacco user    Past Surgical History:  Procedure Laterality Date   ABDOMINAL HYSTERECTOMY  1995   BIOPSY  01/15/2022   Procedure: BIOPSY;  Surgeon: Wilhelmenia Aloha Raddle., MD;  Location: [facility];  Service: Gastroenterology;;   BONE BIOPSY  06/24/2023   Procedure: BIOPSY;  Surgeon: Wilhelmenia Aloha Raddle., MD;  Location: [facility];  Service: Gastroenterology;;   BRAIN TUMOR EXCISION   02/24/2021   at [facility]   BREAST BIOPSY Bilateral 1985   CARDIAC CATHETERIZATION     CATARACT EXTRACTION Right 07/2017   COLONOSCOPY     COLONOSCOPY WITH PROPOFOL  N/A 01/15/2022   Procedure: COLONOSCOPY WITH PROPOFOL ;  Surgeon: Wilhelmenia Aloha Raddle., MD;  Location: [facility];  Service: Gastroenterology;  Laterality: N/A;   COLONOSCOPY WITH PROPOFOL  N/A 06/24/2023   Procedure: COLONOSCOPY WITH PROPOFOL ;  Surgeon: Wilhelmenia Aloha Raddle., MD;  Location: [facility];  Service: Gastroenterology;  Laterality: N/A;   CORONARY STENT INTERVENTION N/A 12/26/2018   Procedure: CORONARY STENT INTERVENTION;  Surgeon: Claudene Victory ORN, MD;  Location: [facility];  Service: Cardiovascular;  Laterality: N/A;   ESOPHAGOGASTRODUODENOSCOPY (EGD) WITH PROPOFOL  N/A 01/15/2022   Procedure: ESOPHAGOGASTRODUODENOSCOPY (EGD) WITH PROPOFOL ;  Surgeon: Wilhelmenia Aloha Raddle., MD;  Location: [facility];  Service: Gastroenterology;  Laterality: N/A;   ESOPHAGOGASTRODUODENOSCOPY (EGD) WITH PROPOFOL  N/A 06/24/2023   Procedure: ESOPHAGOGASTRODUODENOSCOPY (EGD) WITH PROPOFOL ;  Surgeon: Wilhelmenia Aloha Raddle., MD;  Location: [facility];  Service: Gastroenterology;  Laterality: N/A;   EYE SURGERY  2019, 2020   Cataracts, eye shunt implants   LEFT HEART CATH AND CORONARY ANGIOGRAPHY N/A 12/17/2018   Procedure: LEFT HEART CATH AND CORONARY ANGIOGRAPHY;  Surgeon: Claudene Victory ORN, MD;  Location: [facility];  Service: Cardiovascular;  Laterality: N/A;  POLYPECTOMY  01/15/2022   Procedure: POLYPECTOMY;  Surgeon: Mansouraty, Aloha Raddle., MD;  Location: [facility];  Service: Gastroenterology;;   POLYPECTOMY  06/24/2023   Procedure: POLYPECTOMY, INTESTINE;  Surgeon: Wilhelmenia Aloha Raddle., MD;  Location: [facility];  Service: Gastroenterology;;   ROBOT ASSISTED TRACHELECTOMY N/A 09/18/2023   Procedure: TRACHELECTOMY, ROBOT ASSIST;  Surgeon: Viktoria Comer SAUNDERS, MD;  Location: [facility];  Service: Gynecology;   Laterality: N/A;  ROBOT ASSISTED TRACHELECTOMY   ROBOTIC ASSISTED BILATERAL SALPINGO OOPHERECTOMY Bilateral 09/18/2023   Procedure: SALPINGO-OOPHORECTOMY, BILATERAL, ROBOT-ASSISTED;  Surgeon: Viktoria Comer SAUNDERS, MD;  Location: [facility];  Service: Gynecology;  Laterality: Bilateral;   SUPRACERVICAL ABDOMINAL HYSTERECTOMY  1986   UPPER GASTROINTESTINAL ENDOSCOPY      reports that she quit smoking about 6 years ago. Her smoking use included cigarettes. She started smoking about 42 years ago. She has a 9 pack-year smoking history. She has never used smokeless tobacco. She reports that she does not currently use alcohol. She reports that she does not use drugs. family history includes Allergies in her brother; Alzheimer's disease in her mother; Arthritis in her father and mother; Brain cancer in her paternal aunt; Breast cancer (age of onset: 39) in her sister; Breast cancer (age of onset: 73) in her sister; Breast cancer (age of onset: 58) in her sister; Breast cancer (age of onset: 42) in her cousin; Breast cancer (age of onset: 23) in her maternal aunt; Cancer in her sister, sister, and sister; Colon cancer (age of onset: 45) in her paternal grandfather; Deep vein thrombosis in her mother and sister; Early death in her brother, father, and sister; Heart attack in her maternal grandfather; Heart attack (age of onset: 41) in her brother; Hypertension in her father and mother; Kidney cancer (age of onset: 31) in her cousin; Kidney cancer (age of onset: 41) in her father; Kidney cancer (age of onset: 32) in her cousin; Kidney disease in her father; Lung cancer (age of onset: 42) in her paternal uncle; Lung cancer (age of onset: 10) in her maternal uncle; Osteopenia in her mother; Pancreatic cancer (age of onset: 19) in her paternal uncle; Pancreatic cancer (age of onset: 59) in her paternal aunt; Pancreatic cancer (age of onset: 51) in her paternal aunt; Pneumonia in her father; Prostate cancer (age of onset: 65) in  her cousin; Prostate cancer (age of onset: 67) in her maternal uncle; Uterine cancer (age of onset: 73) in her cousin; Uterine cancer (age of onset: 46) in her paternal grandmother; Varicose Veins in her mother. Allergies  Allergen Reactions   Sulfamethoxazole-Trimethoprim Other (See Comments) and Rash    Chills/achy Flu like symptoms    Conjugated Estrogens Rash   Wellbutrin [Bupropion Hcl] Rash      Outpatient Encounter Medications as of 10/30/2023  Medication Sig   ANUCORT-HC  25 MG suppository Place 25 mg rectally 2 (two) times daily.   aspirin  81 MG EC tablet Take 81 mg by mouth every evening.    B Complex Vitamins (VITAMIN B COMPLEX) TABS Take 1 tablet by mouth daily.   betamethasone , augmented, (DIPROLENE ) 0.05 % lotion Apply 1 Application topically daily as needed (psoriatic arthritis).   Biotin 1000 MCG tablet Take 1,000 mcg by mouth daily.   brimonidine  (ALPHAGAN ) 0.2 % ophthalmic solution Place 1 drop into both eyes 3 (three) times daily.    calcipotriene (DOVONOX) 0.005 % cream Apply 1 Application topically daily as needed (psoriatic arthritis).   CALCIUM -VITAMIN D  PO Take 1 tablet by mouth daily.  Cholecalciferol (VITAMIN D ) 50 MCG (2000 UT) tablet Take 2,000 Units by mouth daily.   clopidogrel  (PLAVIX ) 75 MG tablet Take 1 tablet (75 mg total) by mouth daily.   Collagen-Vitamin C-Biotin (COLLAGEN PO) Take 1 Scoop by mouth daily.   dorzolamide -timolol (COSOPT) 22.3-6.8 MG/ML ophthalmic solution Place 1 drop into both eyes 2 (two) times daily.   famotidine (PEPCID) 20 MG tablet Take 20 mg by mouth 2 (two) times daily.   folic acid  (FOLVITE ) 1 MG tablet Take 1 mg by mouth daily.    hydrochlorothiazide  (MICROZIDE ) 12.5 MG capsule TAKE 1 CAPSULE BY MOUTH DAILY   hydrocortisone  (ANUSOL -HC) 2.5 % rectal cream Place 1 Application rectally 2 (two) times daily.   leflunomide (ARAVA) 20 MG tablet Take 20 mg by mouth daily.   metoprolol  succinate (TOPROL -XL) 25 MG 24 hr tablet Take 1  tablet (25 mg total) by mouth daily.   minoxidil (LONITEN) 2.5 MG tablet Take 2.5 mg by mouth daily.   Multiple Vitamins-Minerals (MULTIVITAMIN WITH MINERALS) tablet Take 1 tablet by mouth daily.   REPATHA  SURECLICK 140 MG/ML SOAJ ADMINISTER 1 ML UNDER THE SKIN EVERY 14 DAYS   rosuvastatin  (CRESTOR ) 20 MG tablet TAKE 1 TABLET BY MOUTH DAILY   senna-docusate (SENOKOT-S) 8.6-50 MG tablet Take 2 tablets by mouth at bedtime. For AFTER surgery, do not take if having diarrhea (Patient not taking: Reported on 10/15/2023)   SIMPONI  ARIA 50 MG/4ML SOLN injection Inject 50 mg into the vein every 8 (eight) weeks.   traMADol  (ULTRAM ) 50 MG tablet Take 1 tablet (50 mg total) by mouth every 6 (six) hours as needed for severe pain (pain score 7-10). For AFTER surgery only, do not take and drive (Patient not taking: Reported on 10/15/2023)   VYZULTA 0.024 % SOLN Place 1 drop into the right eye at bedtime.   No facility-administered encounter medications on file as of 10/30/2023.     REVIEW OF SYSTEMS  : All other systems reviewed and negative except where noted in the History of Present Illness.   PHYSICAL EXAM: Ht 5' 7 (1.702 m)   Wt 194 lb 9.6 oz (88.3 kg)   LMP 03/19/1998 Comment: h/o supracervical hysterectomy  BMI 30.48 kg/m  General: Well developed white female in no acute distress Head: Normocephalic and atraumatic Eyes:  sclerae anicteric,conjunctive pink. Ears: Normal auditory acuity Lungs: Clear throughout to auscultation Heart: Regular rate and rhythm Abdomen: Soft, nontender, non distended. No masses or hepatomegaly noted. Normal bowel sounds Rectal: *** Musculoskeletal: Symmetrical with no gross deformities  Skin: No lesions on visible extremities Extremities: No edema  Neurological: Alert oriented x 4, grossly nonfocal Cervical Nodes:  No significant cervical adenopathy Psychological:  Alert and cooperative. Normal mood and affect  ASSESSMENT AND PLAN: GERD history of esophagitis  and Lynch syndrome EGD 06/24/23 at [facility] [place] grade a, 2 cm HH erythematous mucosa cardia gastric fundus normal duodenum Recall endoscopy every 2 to 4 years consider earlier if repeat endoscopy for esophagitis is warranted, negative H. pylori gastritis Can have some GERD at night, some epigastric tenderness Has been off pantoprazole  for about a year due to fear of dementia, on pepcid as needed - continue 20 mg BID for 3 months -alginate therapy given -Lifestyle changes discussed, avoid NSAIDS, ETOH, hand out given to the patient   Lynch syndrome with personal history of polyps Status post supracervical hysterectomy 09/2023 plan for salpingo-oophorectomy, possible trachelectomy, possible laparotomy, and any other indicated procedures  06/24/2023 colonoscopy at [facility] with good prep internal and  external hemorrhoids, 4 mm TA polyp in ascending colon diverticulosis normal mucosa repeat colonoscopy 1 year   CC:  Levora Reyes SAUNDERS, MD

## 2023-11-03 ENCOUNTER — Encounter: Payer: Self-pay | Admitting: Gynecologic Oncology

## 2023-11-06 ENCOUNTER — Encounter: Payer: Self-pay | Admitting: Gastroenterology

## 2023-11-08 NOTE — Progress Notes (Signed)
 Attending Physician's Attestation   I have reviewed the chart.   I agree with the Advanced Practitioner's note, impression, and recommendations with any updates as below.    Corliss Parish, MD [facility] Advanced Endoscopy Office # [phone]

## 2023-11-11 ENCOUNTER — Telehealth: Payer: Self-pay | Admitting: Pharmacy Technician

## 2023-11-11 NOTE — Telephone Encounter (Signed)
 Pharmacy Patient Advocate Encounter  Received notification from [employer] that Prior Authorization for REPATHA  has been APPROVED from 11/11/23 to 12/01/24   PA #/Case ID/Reference #: [id number]

## 2023-11-11 NOTE — Telephone Encounter (Signed)
   Pharmacy Patient Advocate Encounter   Received notification from Cover[employer]eds that prior authorization for repatha  is required/requested.   Insurance verification completed.   The patient is insured through [facility] .   Per test claim: PA required; PA submitted to above mentioned insurance via Latent Key/confirmation #/EOC [id number] Status is pending

## 2023-11-15 DIAGNOSIS — D329 Benign neoplasm of meninges, unspecified: Secondary | ICD-10-CM | POA: Diagnosis not present

## 2023-11-15 DIAGNOSIS — D352 Benign neoplasm of pituitary gland: Secondary | ICD-10-CM | POA: Diagnosis not present

## 2023-11-15 DIAGNOSIS — Z9889 Other specified postprocedural states: Secondary | ICD-10-CM | POA: Diagnosis not present

## 2023-11-25 ENCOUNTER — Encounter (HOSPITAL_BASED_OUTPATIENT_CLINIC_OR_DEPARTMENT_OTHER): Payer: Self-pay | Admitting: Obstetrics & Gynecology

## 2023-11-25 ENCOUNTER — Ambulatory Visit (HOSPITAL_BASED_OUTPATIENT_CLINIC_OR_DEPARTMENT_OTHER): Admitting: Obstetrics & Gynecology

## 2023-11-25 VITALS — BP 126/78 | HR 97 | Wt 194.2 lb

## 2023-11-25 DIAGNOSIS — R748 Abnormal levels of other serum enzymes: Secondary | ICD-10-CM | POA: Diagnosis not present

## 2023-11-25 DIAGNOSIS — R3915 Urgency of urination: Secondary | ICD-10-CM | POA: Diagnosis not present

## 2023-11-25 DIAGNOSIS — R232 Flushing: Secondary | ICD-10-CM | POA: Diagnosis not present

## 2023-11-25 NOTE — Progress Notes (Signed)
 GYNECOLOGY  VISIT  CC:   hot flashes after sugery  HPI: 73 y.o. G40P0010 Married [place] or [place] American female here for vasomotor symptoms.  She underwent laparoscopic (robotic) trachelectomy and bother ovaries and fallopian tubes.  Pathology was benign but showed ovarian stromal hyperthecosis.  Hot flashes started pretty quickly after her surgery.  She wants to discuss possible treatment options.  She and I discussed I do not want her to use estrogen therapy at this time given prior cardiac hx and hx of stent placement.  We discussed Veozah  use, paxil and/or gabapentin  use.  Her sister suggested she consider Veozah .    She is also having urinary urgency.  She feels like she needs to void almost all the time.  Drinks a up of coffee in the morning.  Doesn't drink tea and typically doesn't drink soda.  Except right now, she is drinking some diet Mt Dew due to feeling a lot of fatigue.  Having to wear a panty liner.  She really doesn't want to be on any medication.     Past Medical History:  Diagnosis Date   Allergic rhinitis    Allergy    Arthritis    Brain tumor (HCC)    Breast cyst 2007   Right   Cataract    surgery 07/2017   Clotting disorder (HCC)    On plavix  since stents  for artery blockages   Coronary artery disease    Depression 2006   w/ menopause   Depression screening 09/27/2021   Family history of adverse reaction to anesthesia    sister PONV   Family history of breast cancer    Family history of colon cancer    Family history of kidney cancer 01/15/2018   Family history of lung cancer    Family history of pancreatic cancer    Family history of prostate cancer    Family history of uterine cancer    GERD (gastroesophageal reflux disease)    Glaucoma    Heart murmur    History of hysterectomy, supracervical    Hyperlipidemia    Hypertension    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   MLH1 c.1410-2_1410-1delinsCC (Splice site)   Multinodular goiter    PAD (peripheral artery  disease) (HCC)    LE Art US  1/18: Occluded prox-mid R SFA // ABIs 9/22: R 0.83; L 1.0   Pustular psoriasis    Tobacco user     MEDS:   Current Outpatient Medications on File Prior to Visit  Medication Sig Dispense Refill   aspirin  81 MG EC tablet Take 81 mg by mouth every evening.      B Complex Vitamins (VITAMIN B COMPLEX) TABS Take 1 tablet by mouth daily.     betamethasone , augmented, (DIPROLENE ) 0.05 % lotion Apply 1 Application topically daily as needed (psoriatic arthritis).     Biotin 1000 MCG tablet Take 1,000 mcg by mouth daily.     brimonidine  (ALPHAGAN ) 0.2 % ophthalmic solution Place 1 drop into both eyes 3 (three) times daily.      clopidogrel  (PLAVIX ) 75 MG tablet Take 1 tablet (75 mg total) by mouth daily. 90 tablet 3   dorzolamide -timolol (COSOPT) 22.3-6.8 MG/ML ophthalmic solution Place 1 drop into both eyes 2 (two) times daily.     famotidine (PEPCID) 20 MG tablet Take 20 mg by mouth 2 (two) times daily.     folic acid  (FOLVITE ) 1 MG tablet Take 1 mg by mouth daily.      lansoprazole  (PREVACID ) 30 MG  capsule Take 1 capsule (30 mg total) by mouth daily before breakfast. 30 capsule 3   leflunomide (ARAVA) 20 MG tablet Take 20 mg by mouth daily.     metoprolol  succinate (TOPROL -XL) 25 MG 24 hr tablet Take 1 tablet (25 mg total) by mouth daily. 90 tablet 3   minoxidil (LONITEN) 2.5 MG tablet Take 2.5 mg by mouth daily.     Multiple Vitamins-Minerals (MULTIVITAMIN WITH MINERALS) tablet Take 1 tablet by mouth daily.     REPATHA  SURECLICK 140 MG/ML SOAJ ADMINISTER 1 ML UNDER THE SKIN EVERY 14 DAYS 6 mL 1   rosuvastatin  (CRESTOR ) 20 MG tablet TAKE 1 TABLET BY MOUTH DAILY 90 tablet 3   SIMPONI  ARIA 50 MG/4ML SOLN injection Inject 50 mg into the vein every 8 (eight) weeks.     VYZULTA 0.024 % SOLN Place 1 drop into the right eye at bedtime.     ANUCORT-HC  25 MG suppository Place 25 mg rectally 2 (two) times daily. (Patient not taking: Reported on 11/25/2023)     calcipotriene  (DOVONOX) 0.005 % cream Apply 1 Application topically daily as needed (psoriatic arthritis). (Patient not taking: Reported on 11/25/2023)     CALCIUM -VITAMIN D  PO Take 1 tablet by mouth daily. (Patient not taking: Reported on 11/25/2023)     Cholecalciferol (VITAMIN D ) 50 MCG (2000 UT) tablet Take 2,000 Units by mouth daily. (Patient not taking: Reported on 11/25/2023)     Collagen-Vitamin C-Biotin (COLLAGEN PO) Take 1 Scoop by mouth daily. (Patient not taking: Reported on 11/25/2023)     hydrochlorothiazide  (MICROZIDE ) 12.5 MG capsule TAKE 1 CAPSULE BY MOUTH DAILY 90 capsule 3   hydrocortisone  (ANUSOL -HC) 2.5 % rectal cream Place 1 Application rectally 2 (two) times daily. (Patient not taking: Reported on 11/25/2023) 30 g 2   No current facility-administered medications on file prior to visit.    ALLERGIES: Sulfamethoxazole-trimethoprim, Conjugated estrogens, and Wellbutrin [bupropion hcl]  SH:  married, non smoker  Review of Systems  Constitutional: Negative.   Genitourinary: Negative.     PHYSICAL EXAMINATION:    BP 126/78 (BP Location: Right Arm, Patient Position: Sitting, Cuff Size: Normal)   Pulse 97   Wt 194 lb 3.2 oz (88.1 kg)   LMP 03/19/1998 Comment: h/o supracervical hysterectomy  SpO2 100%   BMI 30.42 kg/m     Physical Exam Constitutional:      Appearance: Normal appearance.  Neurological:     General: No focal deficit present.     Mental Status: She is alert.  Psychiatric:        Mood and Affect: Mood normal.    Assessment/Plan: 1. Hot flashes (Primary) - discussed options for treatment.  I do not recommend and estrogen products.   - she is most interested in Paxil right now if cannot use Veozah  due to hx of elevated liver enzymes.    2. Elevated liver enzymes - Hepatic function panel.  Will update this today to see still abnormal.  If that is the case, will not recommend using Veozah .  3. Urinary urgency - Urine Culture - she is interested in pelvic PT.

## 2023-11-26 ENCOUNTER — Ambulatory Visit (HOSPITAL_BASED_OUTPATIENT_CLINIC_OR_DEPARTMENT_OTHER): Payer: Self-pay | Admitting: Obstetrics & Gynecology

## 2023-11-26 DIAGNOSIS — R232 Flushing: Secondary | ICD-10-CM

## 2023-11-26 LAB — HEPATIC FUNCTION PANEL
ALT: 28 IU/L (ref 0–32)
AST: 37 IU/L (ref 0–40)
Albumin: 4.2 g/dL (ref 3.8–4.8)
Alkaline Phosphatase: 59 IU/L (ref 44–121)
Bilirubin Total: 1 mg/dL (ref 0.0–1.2)
Bilirubin, Direct: 0.36 mg/dL (ref 0.00–0.40)
Total Protein: 6.7 g/dL (ref 6.0–8.5)

## 2023-11-26 LAB — URINE CULTURE

## 2023-11-27 ENCOUNTER — Other Ambulatory Visit (HOSPITAL_BASED_OUTPATIENT_CLINIC_OR_DEPARTMENT_OTHER): Payer: Self-pay | Admitting: Obstetrics & Gynecology

## 2023-11-27 ENCOUNTER — Encounter (HOSPITAL_BASED_OUTPATIENT_CLINIC_OR_DEPARTMENT_OTHER): Payer: Self-pay | Admitting: Obstetrics & Gynecology

## 2023-11-27 DIAGNOSIS — R232 Flushing: Secondary | ICD-10-CM

## 2023-11-27 MED ORDER — VEOZAH 45 MG PO TABS: 1.0000 | ORAL_TABLET | Freq: Every day | ORAL | 0 refills | Status: DC

## 2023-11-29 DIAGNOSIS — M545 Low back pain, unspecified: Secondary | ICD-10-CM | POA: Diagnosis not present

## 2023-11-29 DIAGNOSIS — D329 Benign neoplasm of meninges, unspecified: Secondary | ICD-10-CM | POA: Diagnosis not present

## 2023-12-02 ENCOUNTER — Encounter (HOSPITAL_BASED_OUTPATIENT_CLINIC_OR_DEPARTMENT_OTHER): Payer: Self-pay

## 2023-12-02 DIAGNOSIS — Z780 Asymptomatic menopausal state: Secondary | ICD-10-CM | POA: Diagnosis not present

## 2023-12-02 DIAGNOSIS — D443 Neoplasm of uncertain behavior of pituitary gland: Secondary | ICD-10-CM | POA: Diagnosis not present

## 2023-12-02 DIAGNOSIS — H401133 Primary open-angle glaucoma, bilateral, severe stage: Secondary | ICD-10-CM | POA: Diagnosis not present

## 2023-12-10 ENCOUNTER — Encounter: Payer: Self-pay | Admitting: Gastroenterology

## 2023-12-12 DIAGNOSIS — M059 Rheumatoid arthritis with rheumatoid factor, unspecified: Secondary | ICD-10-CM | POA: Diagnosis not present

## 2023-12-16 ENCOUNTER — Telehealth: Payer: Self-pay | Admitting: *Deleted

## 2023-12-16 NOTE — Telephone Encounter (Signed)
 Spoke with Pamela Lowe who called the office stating she starting having vaginal bleeding/discharge 1 week ago.   Patient states the discharge is pinkish red and somewhat yellowish and watery. She first noticed the discharge on toilet tissue after urinating.  Pt denies pain, fever, and all urinary symptoms. Reports having regular bowel movements without straining but admits she has hemorrhoids. Pt denies placing anything in the vagina. Pt will be 12 weeks post op tomorrow 9/30.  Patient also reports having a cyst towards her rectum about a week ago and now it is gone. She states she has had the cysts before.  Reports seeing Dr. Cleotilde on 11/27/23 and was started on Veozah  on 9/15.  Per Eleanor Epps, NP advised patient to monitor the bleeding, this could be the vaginal cuff healing. Pt is aware to call the office if bleeding increases with or without other symptoms. Pt verbalized understanding and thanked the office for calling.

## 2023-12-17 DIAGNOSIS — E210 Primary hyperparathyroidism: Secondary | ICD-10-CM | POA: Diagnosis not present

## 2023-12-17 DIAGNOSIS — E2740 Unspecified adrenocortical insufficiency: Secondary | ICD-10-CM | POA: Diagnosis not present

## 2023-12-17 DIAGNOSIS — D352 Benign neoplasm of pituitary gland: Secondary | ICD-10-CM | POA: Diagnosis not present

## 2023-12-17 DIAGNOSIS — E230 Hypopituitarism: Secondary | ICD-10-CM | POA: Diagnosis not present

## 2023-12-19 ENCOUNTER — Ambulatory Visit (HOSPITAL_BASED_OUTPATIENT_CLINIC_OR_DEPARTMENT_OTHER): Admitting: Obstetrics & Gynecology

## 2023-12-19 ENCOUNTER — Encounter (HOSPITAL_BASED_OUTPATIENT_CLINIC_OR_DEPARTMENT_OTHER): Payer: Self-pay | Admitting: Obstetrics & Gynecology

## 2023-12-19 VITALS — BP 121/83 | HR 77 | Wt 193.8 lb

## 2023-12-19 DIAGNOSIS — N920 Excessive and frequent menstruation with regular cycle: Secondary | ICD-10-CM | POA: Diagnosis not present

## 2023-12-19 DIAGNOSIS — Z90710 Acquired absence of both cervix and uterus: Secondary | ICD-10-CM | POA: Diagnosis not present

## 2023-12-19 DIAGNOSIS — N952 Postmenopausal atrophic vaginitis: Secondary | ICD-10-CM

## 2023-12-19 DIAGNOSIS — R748 Abnormal levels of other serum enzymes: Secondary | ICD-10-CM | POA: Diagnosis not present

## 2023-12-19 LAB — POCT URINALYSIS DIP (CLINITEK)
Bilirubin, UA: NEGATIVE
Blood, UA: NEGATIVE
Glucose, UA: NEGATIVE mg/dL
Ketones, POC UA: NEGATIVE mg/dL
Leukocytes, UA: NEGATIVE
Nitrite, UA: NEGATIVE
Spec Grav, UA: >=1.03 — AB (ref 1.010–1.025)
Urobilinogen, UA: 0.2 U/dL
pH, UA: 5.5 (ref 5.0–8.0)

## 2023-12-19 NOTE — Progress Notes (Unsigned)
 GYNECOLOGY  VISIT  CC:   vaginal bleeding  HPI: 73 y.o. G74P0010 Married [place] or [place] American female here for irregular bleeding within the last couple of weeks.  She is redoing her kitchen.     Past Medical History:  Diagnosis Date   Allergic rhinitis    Allergy    Arthritis    Brain tumor (HCC)    Breast cyst 2007   Right   Cataract    surgery 07/2017   Clotting disorder    On plavix  since stents  for artery blockages   Coronary artery disease    Depression 2006   w/ menopause   Depression screening 09/27/2021   Family history of adverse reaction to anesthesia    sister PONV   Family history of breast cancer    Family history of colon cancer    Family history of kidney cancer 01/15/2018   Family history of lung cancer    Family history of pancreatic cancer    Family history of prostate cancer    Family history of uterine cancer    GERD (gastroesophageal reflux disease)    Glaucoma    Heart murmur    History of hysterectomy, supracervical    Hyperlipidemia    Hypertension    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   MLH1 c.1410-2_1410-1delinsCC (Splice site)   Multinodular goiter    PAD (peripheral artery disease)    LE Art US  1/18: Occluded prox-mid R SFA // ABIs 9/22: R 0.83; L 1.0   Pustular psoriasis    Tobacco user     MEDS:   Current Outpatient Medications on File Prior to Visit  Medication Sig Dispense Refill   ANUCORT-HC  25 MG suppository Place 25 mg rectally 2 (two) times daily. (Patient not taking: Reported on 11/25/2023)     aspirin  81 MG EC tablet Take 81 mg by mouth every evening.      B Complex Vitamins (VITAMIN B COMPLEX) TABS Take 1 tablet by mouth daily.     betamethasone , augmented, (DIPROLENE ) 0.05 % lotion Apply 1 Application topically daily as needed (psoriatic arthritis).     Biotin 1000 MCG tablet Take 1,000 mcg by mouth daily.     brimonidine  (ALPHAGAN ) 0.2 % ophthalmic solution Place 1 drop into both eyes 3 (three) times daily.      calcipotriene  (DOVONOX) 0.005 % cream Apply 1 Application topically daily as needed (psoriatic arthritis). (Patient not taking: Reported on 11/25/2023)     clopidogrel  (PLAVIX ) 75 MG tablet Take 1 tablet (75 mg total) by mouth daily. 90 tablet 3   dorzolamide -timolol (COSOPT) 22.3-6.8 MG/ML ophthalmic solution Place 1 drop into both eyes 2 (two) times daily.     famotidine (PEPCID) 20 MG tablet Take 20 mg by mouth 2 (two) times daily.     Fezolinetant  (VEOZAH ) 45 MG TABS Take 1 tablet (45 mg total) by mouth daily. 30 tablet 0   folic acid  (FOLVITE ) 1 MG tablet Take 1 mg by mouth daily.      hydrochlorothiazide  (MICROZIDE ) 12.5 MG capsule TAKE 1 CAPSULE BY MOUTH DAILY 90 capsule 3   lansoprazole  (PREVACID ) 30 MG capsule Take 1 capsule (30 mg total) by mouth daily before breakfast. 30 capsule 3   leflunomide (ARAVA) 20 MG tablet Take 20 mg by mouth daily.     metoprolol  succinate (TOPROL -XL) 25 MG 24 hr tablet Take 1 tablet (25 mg total) by mouth daily. 90 tablet 3   minoxidil (LONITEN) 2.5 MG tablet Take 2.5 mg by mouth daily.  Multiple Vitamins-Minerals (MULTIVITAMIN WITH MINERALS) tablet Take 1 tablet by mouth daily.     REPATHA  SURECLICK 140 MG/ML SOAJ ADMINISTER 1 ML UNDER THE SKIN EVERY 14 DAYS 6 mL 1   rosuvastatin  (CRESTOR ) 20 MG tablet TAKE 1 TABLET BY MOUTH DAILY 90 tablet 3   SIMPONI  ARIA 50 MG/4ML SOLN injection Inject 50 mg into the vein every 8 (eight) weeks.     VYZULTA 0.024 % SOLN Place 1 drop into the right eye at bedtime.     No current facility-administered medications on file prior to visit.    ALLERGIES: Sulfamethoxazole-trimethoprim, Conjugated estrogens, and Wellbutrin [bupropion hcl]  SH:  ***  ROS  PHYSICAL EXAMINATION:    LMP 03/19/1998 Comment: h/o supracervical hysterectomy    General appearance: alert, cooperative and appears stated age Neck: no adenopathy, supple, symmetrical, trachea midline and thyroid  {CHL AMB PHY EX THYROID  NORM DEFAULT:[phone]::normal to  inspection and palpation} CV:  {Exam; heart brief:31539} Lungs:  {pe lungs ob:314451} Breasts: {Exam; breast:13139::normal appearance, no masses or tenderness} Abdomen: soft, non-tender; bowel sounds normal; no masses,  no organomegaly Lymph:  no inguinal LAD noted  Pelvic: External genitalia:  no lesions              Urethra:  normal appearing urethra with no masses, tenderness or lesions              Bartholins and Skenes: normal                 Vagina: {exam; pelvic vaginal:30846}              Cervix: {CHL AMB PHY EX CERVIX NORM DEFAULT:[phone]::no lesions}              Bimanual Exam:  Uterus:  {CHL AMB PHY EX UTERUS NORM DEFAULT:[phone]::normal size, contour, position, consistency, mobility, non-tender}              Adnexa: {CHL AMB PHY EX ADNEXA NO MASS DEFAULT:[id number]::no mass, fullness, tenderness}              Rectovaginal: {yes no:[id number]}.  Confirms.              Anus:  normal sphincter tone, no lesions  Chaperone, ***, CMA, was present for exam.  Assessment/Plan: There are no diagnoses linked to this encounter.

## 2023-12-20 ENCOUNTER — Encounter (HOSPITAL_BASED_OUTPATIENT_CLINIC_OR_DEPARTMENT_OTHER): Payer: Self-pay | Admitting: Obstetrics & Gynecology

## 2023-12-20 MED ORDER — ESTRADIOL 0.1 MG/GM VA CREA: TOPICAL_CREAM | VAGINAL | 1 refills | Status: DC

## 2023-12-21 DIAGNOSIS — Z23 Encounter for immunization: Secondary | ICD-10-CM | POA: Diagnosis not present

## 2023-12-25 ENCOUNTER — Ambulatory Visit (HOSPITAL_BASED_OUTPATIENT_CLINIC_OR_DEPARTMENT_OTHER): Admitting: Obstetrics & Gynecology

## 2023-12-26 DIAGNOSIS — E0590 Thyrotoxicosis, unspecified without thyrotoxic crisis or storm: Secondary | ICD-10-CM | POA: Diagnosis not present

## 2023-12-26 DIAGNOSIS — E210 Primary hyperparathyroidism: Secondary | ICD-10-CM | POA: Diagnosis not present

## 2023-12-26 DIAGNOSIS — E230 Hypopituitarism: Secondary | ICD-10-CM | POA: Diagnosis not present

## 2023-12-26 DIAGNOSIS — E2740 Unspecified adrenocortical insufficiency: Secondary | ICD-10-CM | POA: Diagnosis not present

## 2023-12-26 DIAGNOSIS — E049 Nontoxic goiter, unspecified: Secondary | ICD-10-CM | POA: Diagnosis not present

## 2023-12-26 DIAGNOSIS — Z79899 Other long term (current) drug therapy: Secondary | ICD-10-CM | POA: Diagnosis not present

## 2023-12-26 DIAGNOSIS — I10 Essential (primary) hypertension: Secondary | ICD-10-CM | POA: Diagnosis not present

## 2023-12-26 DIAGNOSIS — D352 Benign neoplasm of pituitary gland: Secondary | ICD-10-CM | POA: Diagnosis not present

## 2023-12-29 ENCOUNTER — Other Ambulatory Visit (HOSPITAL_BASED_OUTPATIENT_CLINIC_OR_DEPARTMENT_OTHER): Payer: Self-pay | Admitting: Obstetrics & Gynecology

## 2023-12-29 DIAGNOSIS — R232 Flushing: Secondary | ICD-10-CM

## 2023-12-30 DIAGNOSIS — N184 Chronic kidney disease, stage 4 (severe): Secondary | ICD-10-CM | POA: Diagnosis not present

## 2023-12-30 DIAGNOSIS — R748 Abnormal levels of other serum enzymes: Secondary | ICD-10-CM | POA: Diagnosis not present

## 2023-12-30 NOTE — Telephone Encounter (Signed)
 Called patient and she came in to have labs drawn today. Informed patient that once results have been reviewed, then we will send in the Rx refill. Patient expressed understanding. She states that she has about 4 pills left. tbw

## 2023-12-31 LAB — HEPATIC FUNCTION PANEL
ALT: 21 IU/L (ref 0–32)
AST: 33 IU/L (ref 0–40)
Albumin: 4 g/dL (ref 3.8–4.8)
Alkaline Phosphatase: 57 IU/L (ref 49–135)
Bilirubin Total: 1.1 mg/dL (ref 0.0–1.2)
Bilirubin, Direct: 0.44 mg/dL — ABNORMAL HIGH (ref 0.00–0.40)
Total Protein: 6.6 g/dL (ref 6.0–8.5)

## 2024-01-01 DIAGNOSIS — E230 Hypopituitarism: Secondary | ICD-10-CM | POA: Diagnosis not present

## 2024-01-01 DIAGNOSIS — E2740 Unspecified adrenocortical insufficiency: Secondary | ICD-10-CM | POA: Diagnosis not present

## 2024-01-02 DIAGNOSIS — Z79899 Other long term (current) drug therapy: Secondary | ICD-10-CM | POA: Diagnosis not present

## 2024-01-02 DIAGNOSIS — M47819 Spondylosis without myelopathy or radiculopathy, site unspecified: Secondary | ICD-10-CM | POA: Diagnosis not present

## 2024-01-02 DIAGNOSIS — M1990 Unspecified osteoarthritis, unspecified site: Secondary | ICD-10-CM | POA: Diagnosis not present

## 2024-01-02 DIAGNOSIS — L4050 Arthropathic psoriasis, unspecified: Secondary | ICD-10-CM | POA: Diagnosis not present

## 2024-01-03 ENCOUNTER — Ambulatory Visit (HOSPITAL_BASED_OUTPATIENT_CLINIC_OR_DEPARTMENT_OTHER): Payer: Self-pay | Admitting: Obstetrics & Gynecology

## 2024-01-08 DIAGNOSIS — M0589 Other rheumatoid arthritis with rheumatoid factor of multiple sites: Secondary | ICD-10-CM | POA: Diagnosis not present

## 2024-01-08 DIAGNOSIS — I10 Essential (primary) hypertension: Secondary | ICD-10-CM | POA: Diagnosis not present

## 2024-01-08 DIAGNOSIS — R7303 Prediabetes: Secondary | ICD-10-CM | POA: Diagnosis not present

## 2024-01-08 DIAGNOSIS — E669 Obesity, unspecified: Secondary | ICD-10-CM | POA: Diagnosis not present

## 2024-01-09 ENCOUNTER — Ambulatory Visit: Admitting: Family Medicine

## 2024-01-09 ENCOUNTER — Ambulatory Visit: Payer: Self-pay | Admitting: Family Medicine

## 2024-01-09 ENCOUNTER — Encounter: Payer: Self-pay | Admitting: Family Medicine

## 2024-01-09 VITALS — BP 110/70 | HR 57 | Temp 97.9°F | Wt 195.0 lb

## 2024-01-09 DIAGNOSIS — N1831 Chronic kidney disease, stage 3a: Secondary | ICD-10-CM | POA: Diagnosis not present

## 2024-01-09 DIAGNOSIS — R7303 Prediabetes: Secondary | ICD-10-CM

## 2024-01-09 DIAGNOSIS — M79675 Pain in left toe(s): Secondary | ICD-10-CM

## 2024-01-09 DIAGNOSIS — E782 Mixed hyperlipidemia: Secondary | ICD-10-CM

## 2024-01-09 DIAGNOSIS — I10 Essential (primary) hypertension: Secondary | ICD-10-CM

## 2024-01-09 LAB — POCT GLYCOSYLATED HEMOGLOBIN (HGB A1C): Hemoglobin A1C: 5.1 % (ref 4.0–5.6)

## 2024-01-09 NOTE — Progress Notes (Signed)
 Subjective:  Patient ID: Pamela Lowe, female    DOB: 1950/05/31  Age: 73 y.o. MRN: [id number]  CC:  Chief Complaint  Patient presents with   Medical Management of Chronic Issues    HPI Pamela Lowe presents for follow up.   Prophylactic oophorectomy for lynch syndrome in [place] - Dr. Viktoria. GYn Dr. Cleotilde, with recent visit for some vaginal bleeding, from healing from prior surgery. Some delay in healing with her other meds, immunosuppressant therapy. Paused Arava this week. Simponi  soundra paused until healing form prior surgery. Plan for discussion b/t rhuem and GYN. On estradiol  cream to help promote healing.   Hypertension: With CKD stage III AA thought to be nephrosclerosis due to hypertension, age, atherosclerosis, plus or minus use of methotrexate previously.  She avoids NSAIDs and has been followed by rheumatology [facility] with history of RA treated with Arava. endocrinology with Dr. Tommas with adrenal insufficiency, hypopituitarism status post meningioma removal.    For hypertension takes Toprol  25mg  and HCTZ 12.5mg  without new side effects. Also on plavix  with hx of CAD, PCI of LAD in 12/2018. PAD. Cardiology Dr. Raford, appt 08/29/23. Continued on plavix  and ASA.  D/c zetia , continued crestor , repatha .  Home readings: BP Readings from Last 3 Encounters:  01/09/24 110/70  12/19/23 121/83  11/25/23 126/78   Lab Results  Component Value Date   CREATININE 1.02 (H) 09/06/2023  Nephrology - Dr. Dolan - appt next month - recent labs obtained On pt's phone, 12/29/13: Cbc, renal panel. Hgb 13.1. glucose 98, creat 0.96. egfr 63  Soreness b/t great and second toe at night at times. No other areas involved. Better with spacer.   Hyperlipidemia: Treated with Crestor , Repatha . Multiple labs with other specialists, recent labs last week with endocrine.  History of CAD, PAD that has been followed by cardiology.  Dr. Raford. Lab Results  Component Value Date   CHOL  98 (L) 08/09/2023   HDL 67 08/09/2023   LDLCALC 17 08/09/2023   TRIG 60 08/09/2023   CHOLHDL 1.5 08/09/2023   Lab Results  Component Value Date   ALT 21 12/30/2023   AST 33 12/30/2023   ALKPHOS 57 12/30/2023   BILITOT 1.1 12/30/2023  Prior elevated AST, bilirubin improved on repeat labs.  Recent LFTs noted above.  Prediabetes: Diet/exercise approach.  Few pound increase in weight from April, 192-195 today. Last A1c in normal range.  Lab Results  Component Value Date   HGBA1C 5.4 07/10/2023   Wt Readings from Last 3 Encounters:  01/09/24 195 lb (88.5 kg)  12/19/23 193 lb 12.8 oz (87.9 kg)  11/25/23 194 lb 3.2 oz (88.1 kg)   HM:  Plans on covid booster at pharmacy Flu vaccine - received at [facility] 12/21/23 on [place] recently   History Patient Active Problem List   Diagnosis Date Noted   Hx of adenomatous colonic polyps 05/14/2023   Antiplatelet or antithrombotic long-term use 05/14/2023   Generalized obesity wtih starting BMI 34 09/05/2022   Pre-diabetes 04/25/2022   Pyrosis 03/30/2022   Bloating 03/30/2022   Abdominal pain, epigastric 03/30/2022   Polyphagia 03/22/2022   Eating disorder 12/27/2021   Osteoarthritis 09/28/2021   Degeneration of lumbosacral intervertebral disc 09/28/2021   Elevated ALT measurement 09/28/2021   Other fatigue 09/27/2021   Health care maintenance 09/27/2021   Vitamin D  deficiency 09/27/2021   Spondylosis without myelopathy 08/03/2021   Rheumatoid arthritis with rheumatoid factor (HCC) 08/03/2021   Psoriasis 08/03/2021   Hypopituitarism 08/03/2021   Adrenal  insufficiency 08/03/2021   Gastroesophageal reflux disease 07/26/2021   Meningioma (HCC) 03/21/2021   Visual field defect 03/21/2021   Chronic left shoulder pain 03/21/2021   Cervical radiculopathy 03/21/2021   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   Pseudophakia of both eyes 06/29/2019   Steroid responders to glaucoma of both eyes 06/29/2019   S/P coronary artery stent placement     PAD (peripheral artery disease)    CAD (coronary artery disease) 12/26/2018   Primary open-angle glaucoma 10/28/2018   Glaucoma associated with ocular inflammation 10/28/2018   Aortic atherosclerosis 08/19/2018   Uveitis 05/05/2018   Goiter 05/05/2018   Mixed hyperlipidemia 05/05/2018   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   Family history of kidney cancer 01/15/2018   Family history of pancreatic cancer    Family history of breast cancer    Family history of uterine cancer    Family history of colon cancer    Family history of prostate cancer    Family history of lung cancer    Hypertensive retinopathy of both eyes 11/12/2017   Iritis of right eye 11/12/2017   Nuclear sclerotic cataract of left eye 11/12/2017   Ocular hypertension of right eye 11/12/2017   Inflammation of eye, right 08/07/2017   Memory loss 09/21/2016   Essential hypertension 09/21/2016   Low vitamin D  level 09/21/2016   Past Medical History:  Diagnosis Date   Allergic rhinitis    Allergy    Arthritis    Brain tumor (HCC)    Breast cyst 2007   Right   Cataract    surgery 07/2017   Clotting disorder    On plavix  since stents  for artery blockages   Coronary artery disease    Depression 2006   w/ menopause   Depression screening 09/27/2021   Family history of adverse reaction to anesthesia    sister PONV   Family history of breast cancer    Family history of colon cancer    Family history of kidney cancer 01/15/2018   Family history of lung cancer    Family history of pancreatic cancer    Family history of prostate cancer    Family history of uterine cancer    GERD (gastroesophageal reflux disease)    Glaucoma    Heart murmur    History of hysterectomy, supracervical    Hyperlipidemia    Hypertension    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   MLH1 [id number] (Splice site)   Multinodular goiter    PAD (peripheral artery disease)    LE Art US  1/18: Occluded prox-mid R SFA // ABIs 9/22: R 0.83; L 1.0    Pustular psoriasis    Tobacco user    Past Surgical History:  Procedure Laterality Date   ABDOMINAL HYSTERECTOMY  1995   BIOPSY  01/15/2022   Procedure: BIOPSY;  Surgeon: Wilhelmenia Aloha Raddle., MD;  Location: [facility];  Service: Gastroenterology;;   BONE BIOPSY  06/24/2023   Procedure: BIOPSY;  Surgeon: Wilhelmenia Aloha Raddle., MD;  Location: [facility];  Service: Gastroenterology;;   BRAIN TUMOR EXCISION  02/24/2021   at [facility]   BREAST BIOPSY Bilateral 1985   CARDIAC CATHETERIZATION     CATARACT EXTRACTION Right 07/2017   COLONOSCOPY     COLONOSCOPY WITH PROPOFOL  N/A 01/15/2022   Procedure: COLONOSCOPY WITH PROPOFOL ;  Surgeon: Wilhelmenia Aloha Raddle., MD;  Location: [facility];  Service: Gastroenterology;  Laterality: N/A;   COLONOSCOPY WITH PROPOFOL  N/A 06/24/2023   Procedure: COLONOSCOPY WITH PROPOFOL ;  Surgeon: Mansouraty, Aloha Raddle., MD;  Location: [facility];  Service: Gastroenterology;  Laterality: N/A;   CORONARY STENT INTERVENTION N/A 12/26/2018   Procedure: CORONARY STENT INTERVENTION;  Surgeon: Claudene Victory ORN, MD;  Location: [facility];  Service: Cardiovascular;  Laterality: N/A;   ESOPHAGOGASTRODUODENOSCOPY (EGD) WITH PROPOFOL  N/A 01/15/2022   Procedure: ESOPHAGOGASTRODUODENOSCOPY (EGD) WITH PROPOFOL ;  Surgeon: Wilhelmenia Aloha Raddle., MD;  Location: [facility];  Service: Gastroenterology;  Laterality: N/A;   ESOPHAGOGASTRODUODENOSCOPY (EGD) WITH PROPOFOL  N/A 06/24/2023   Procedure: ESOPHAGOGASTRODUODENOSCOPY (EGD) WITH PROPOFOL ;  Surgeon: Wilhelmenia Aloha Raddle., MD;  Location: [facility];  Service: Gastroenterology;  Laterality: N/A;   EYE SURGERY  2019, 2020   Cataracts, eye shunt implants   LEFT HEART CATH AND CORONARY ANGIOGRAPHY N/A 12/17/2018   Procedure: LEFT HEART CATH AND CORONARY ANGIOGRAPHY;  Surgeon: Claudene Victory ORN, MD;  Location: [facility];  Service: Cardiovascular;  Laterality: N/A;   POLYPECTOMY  01/15/2022   Procedure:  POLYPECTOMY;  Surgeon: Wilhelmenia Aloha Raddle., MD;  Location: [facility];  Service: Gastroenterology;;   POLYPECTOMY  06/24/2023   Procedure: POLYPECTOMY, INTESTINE;  Surgeon: Wilhelmenia Aloha Raddle., MD;  Location: [facility];  Service: Gastroenterology;;   ROBOT ASSISTED TRACHELECTOMY N/A 09/18/2023   Procedure: TRACHELECTOMY, ROBOT ASSIST;  Surgeon: Viktoria Comer SAUNDERS, MD;  Location: [facility];  Service: Gynecology;  Laterality: N/A;  ROBOT ASSISTED TRACHELECTOMY   ROBOTIC ASSISTED BILATERAL SALPINGO OOPHERECTOMY Bilateral 09/18/2023   Procedure: SALPINGO-OOPHORECTOMY, BILATERAL, ROBOT-ASSISTED;  Surgeon: Viktoria Comer SAUNDERS, MD;  Location: [facility];  Service: Gynecology;  Laterality: Bilateral;   SUPRACERVICAL ABDOMINAL HYSTERECTOMY  1986   UPPER GASTROINTESTINAL ENDOSCOPY     Allergies  Allergen Reactions   Sulfamethoxazole-Trimethoprim Other (See Comments) and Rash    Chills/achy Flu like symptoms    Wellbutrin [Bupropion Hcl] Rash   Prior to Admission medications   Medication Sig Start Date End Date Taking? Authorizing Provider  aspirin  81 MG EC tablet Take 81 mg by mouth every evening.    Yes [provider]  B Complex Vitamins (VITAMIN B COMPLEX) TABS Take 1 tablet by mouth daily.   Yes [provider]  betamethasone , augmented, (DIPROLENE ) 0.05 % lotion Apply 1 Application topically daily as needed (psoriatic arthritis). 02/11/23  Yes [provider]  Biotin 1000 MCG tablet Take 1,000 mcg by mouth daily.   Yes [provider]  brimonidine  (ALPHAGAN ) 0.2 % ophthalmic solution Place 1 drop into both eyes 3 (three) times daily.  11/20/18  Yes [provider]  clopidogrel  (PLAVIX ) 75 MG tablet Take 1 tablet (75 mg total) by mouth daily. 01/21/23 01/16/24 Yes Raford Riggs, MD  dorzolamide -timolol (COSOPT) 22.3-6.8 MG/ML ophthalmic solution Place 1 drop into both eyes 2 (two) times daily. 06/29/19  Yes [provider]  estradiol   (ESTRACE ) 0.1 MG/GM vaginal cream 1 gram vaginally twice weekly 12/20/23  Yes Cleotilde Ronal RAMAN, MD  famotidine (PEPCID) 20 MG tablet Take 20 mg by mouth 2 (two) times daily.   Yes [provider]  folic acid  (FOLVITE ) 1 MG tablet Take 1 mg by mouth daily.    Yes [provider]  hydrochlorothiazide  (MICROZIDE ) 12.5 MG capsule TAKE 1 CAPSULE BY MOUTH DAILY 10/29/23  Yes Raford Riggs, MD  lansoprazole  (PREVACID ) 30 MG capsule Take 1 capsule (30 mg total) by mouth daily before breakfast. 10/30/23  Yes Zehr, Jessica D, PA-C  leflunomide (ARAVA) 20 MG tablet Take 20 mg by mouth daily.   Yes [provider]  metoprolol  succinate (TOPROL -XL) 25 MG 24 hr tablet Take 1 tablet (25 mg total)  by mouth daily. 01/21/23  Yes Raford Riggs, MD  minoxidil (LONITEN) 2.5 MG tablet Take 2.5 mg by mouth daily.   Yes [provider]  Multiple Vitamins-Minerals (MULTIVITAMIN WITH MINERALS) tablet Take 1 tablet by mouth daily.   Yes [provider]  REPATHA  SURECLICK 140 MG/ML SOAJ ADMINISTER 1 ML UNDER THE SKIN EVERY 14 DAYS 08/28/23  Yes Raford Riggs, MD  rosuvastatin  (CRESTOR ) 20 MG tablet TAKE 1 TABLET BY MOUTH DAILY 09/19/23  Yes Raford Riggs, MD  SIMPONI  ARIA 50 MG/4ML SOLN injection Inject 50 mg into the vein every 8 (eight) weeks. 07/16/19  Yes [provider]  VEOZAH  45 MG TABS TAKE 1 TABLET(45 MG) BY MOUTH DAILY 01/01/24  Yes Cleotilde Ronal RAMAN, MD  VYZULTA 0.024 % SOLN Place 1 drop into the right eye at bedtime. 05/06/23  Yes [provider]  ANUCORT-HC  25 MG suppository Place 25 mg rectally 2 (two) times daily. Patient not taking: Reported on 01/09/2024 09/05/23   [provider]  calcipotriene (DOVONOX) 0.005 % cream Apply 1 Application topically daily as needed (psoriatic arthritis). Patient not taking: Reported on 01/09/2024    [provider]   Social History   Socioeconomic History   Marital status: Married    Spouse  name: Lorrene   Number of children: 0   Years of education: Not on file   Highest education level: Master's degree (e.g., MA, MS, MEng, MEd, MSW, MBA)  Occupational History   Occupation: Special Ed Teacher    Comment: Dentist  Tobacco Use   Smoking status: Former    Current packs/day: 0.00    Average packs/day: 0.3 packs/day for 36.0 years (9.0 ttl pk-yrs)    Types: Cigarettes    Start date: 08/01/1981    Quit date: 08/01/2017    Years since quitting: 6.4   Smokeless tobacco: Never   Tobacco comments:    Started smoking at about 73 yrs old  Vaping Use   Vaping status: Never Used  Substance and Sexual Activity   Alcohol use: Not Currently   Drug use: No   Sexual activity: Not Currently    Partners: Male    Birth control/protection: Post-menopausal, Surgical    Comment: Hysterectomy  Other Topics Concern   Not on file  Social History Narrative   Not on file   Social Drivers of Health   Financial Resource Strain: Low Risk  (01/03/2024)   Overall Financial Resource Strain (CARDIA)    Difficulty of Paying Living Expenses: Not very hard  Food Insecurity: No Food Insecurity (01/03/2024)   Hunger Vital Sign    Worried About Running Out of Food in the Last Year: Never true    Ran Out of Food in the Last Year: Never true  Transportation Needs: No Transportation Needs (01/03/2024)   PRAPARE - Administrator, Civil Service (Medical): No    Lack of Transportation (Non-Medical): No  Physical Activity: Insufficiently Active (01/03/2024)   Exercise Vital Sign    Days of Exercise per Week: 4 days    Minutes of Exercise per Session: 30 min  Stress: No Stress Concern Present (01/03/2024)   [employer] of Occupational Health - Occupational Stress Questionnaire    Feeling of Stress: Not at all  Social Connections: Unknown (01/03/2024)   Social Connection and Isolation Panel    Frequency of Communication with Friends and Family: More than three times a week     Frequency of Social Gatherings with Friends and Family: Not on file  Attends Religious Services: Not on file    Active Member of Clubs or Organizations: No    Attends Banker Meetings: Not on file    Marital Status: Married  Intimate Partner Violence: Not At Risk (09/03/2023)   Humiliation, Afraid, Rape, and Kick questionnaire    Fear of Current or Ex-Partner: No    Emotionally Abused: No    Physically Abused: No    Sexually Abused: No    Review of Systems  Constitutional:  Negative for fatigue and unexpected weight change.  Respiratory:  Negative for chest tightness and shortness of breath.   Cardiovascular:  Negative for chest pain, palpitations and leg swelling.  Gastrointestinal:  Positive for blood in stool (hemorrhoids treated by GI). Negative for abdominal pain.  Neurological:  Negative for dizziness, syncope, light-headedness and headaches.     Objective:   Vitals:   01/09/24 1328  BP: 110/70  Pulse: (!) 57  Temp: 97.9 F (36.6 C)  SpO2: 97%  Weight: 195 lb (88.5 kg)     Physical Exam Vitals reviewed.  Constitutional:      Appearance: Normal appearance. She is well-developed.  HENT:     Head: Normocephalic and atraumatic.  Eyes:     Conjunctiva/sclera: Conjunctivae normal.     Pupils: Pupils are equal, round, and reactive to light.  Neck:     Vascular: No carotid bruit.  Cardiovascular:     Rate and Rhythm: Normal rate and regular rhythm.     Heart sounds: Normal heart sounds.  Pulmonary:     Effort: Pulmonary effort is normal.     Breath sounds: Normal breath sounds.  Abdominal:     Palpations: Abdomen is soft. There is no pulsatile mass.     Tenderness: There is no abdominal tenderness.  Musculoskeletal:     Right lower leg: No edema.     Left lower leg: No edema.     Comments: Left foot, great toe, second toe without soft tissue swelling, erythema or skin changes.  No focal bony tenderness.  Pain-free motion.  No pain at space between  first and metatarsal heads.  No pain with lateral squeeze of metatarsal heads.  Neurovascular intact distally.  Skin:    General: Skin is warm and dry.  Neurological:     Mental Status: She is alert and oriented to person, place, and time.  Psychiatric:        Mood and Affect: Mood normal.        Behavior: Behavior normal.    Results for orders placed or performed in visit on 01/09/24  POCT glycosylated hemoglobin (Hb A1C)   Collection Time: 01/09/24  2:30 PM  Result Value Ref Range   Hemoglobin A1C 5.1 4.0 - 5.6 %   HbA1c POC (<> result, manual entry)     HbA1c, POC (prediabetic range)     HbA1c, POC (controlled diabetic range)      Assessment & Plan:  Pamela Lowe is a 73 y.o. female . Mixed hyperlipidemia  - Regular follow-up and lab work with other specialists  Will hold on repeat labs at this time.  Continue to monitor, no med changes.  Stage 3a chronic kidney disease (HCC)  - Continue follow-up with nephrology.  Labs as above.  Pre-diabetes - Plan: POCT glycosylated hemoglobin (Hb A1C) History of prediabetes, last A1c and in office A1c today are in the normal range.  Continue to monitor electrolytes.  Essential hypertension  - Stable with current regimen, no med changes,  followed by cardiology as above.  Great toe pain, left  - Asymptomatic in office.  Based on location of symptoms question Morton's neuroma but unable to reproduce symptoms with lateral metatarsal head squeeze or pressing on space between her 1st and 2nd metatarsal heads.  Continue to monitor, consider podiatry eval if persistent symptoms.  RTC precautions.  No orders of the defined types were placed in this encounter.  Patient Instructions  Thank you for coming in today.  With the recent labs that have been performed by your other medical providers, I do not think any blood work is needed from me at this time.  I did check your 30-month blood sugar test today and we will let you know if that is  abnormal with history of prediabetes, but your most recent test looked okay.  Let me know if any persistent discomfort between the toes, as sometimes that can be due to a pinched nerve between some of the bones of the foot and may be beneficial to meet with podiatry but your exam is reassuring today.  Follow-up in 6 months and we can decide if any blood work is needed at that time.  Take care!    Signed,   Reyes Pines, MD [facility], [facility] 01/09/24 2:23 PM

## 2024-01-09 NOTE — Patient Instructions (Signed)
 Thank you for coming in today.  With the recent labs that have been performed by your other medical providers, I do not think any blood work is needed from me at this time.  I did check your 50-month blood sugar test today and we will let you know if that is abnormal with history of prediabetes, but your most recent test looked okay.  Let me know if any persistent discomfort between the toes, as sometimes that can be due to a pinched nerve between some of the bones of the foot and may be beneficial to meet with podiatry but your exam is reassuring today.  Follow-up in 6 months and we can decide if any blood work is needed at that time.  Take care!

## 2024-01-16 ENCOUNTER — Telehealth: Payer: Self-pay | Admitting: Neurology

## 2024-01-16 NOTE — Telephone Encounter (Signed)
 Patient rescheduled appointment due to scheduling conflict

## 2024-01-20 ENCOUNTER — Ambulatory Visit: Payer: Medicare Other | Admitting: Neurology

## 2024-01-22 DIAGNOSIS — H04123 Dry eye syndrome of bilateral lacrimal glands: Secondary | ICD-10-CM | POA: Diagnosis not present

## 2024-01-22 DIAGNOSIS — H35033 Hypertensive retinopathy, bilateral: Secondary | ICD-10-CM | POA: Diagnosis not present

## 2024-01-28 ENCOUNTER — Encounter (HOSPITAL_BASED_OUTPATIENT_CLINIC_OR_DEPARTMENT_OTHER): Payer: Self-pay | Admitting: Obstetrics & Gynecology

## 2024-01-29 ENCOUNTER — Other Ambulatory Visit (HOSPITAL_BASED_OUTPATIENT_CLINIC_OR_DEPARTMENT_OTHER): Payer: Self-pay | Admitting: Obstetrics & Gynecology

## 2024-01-29 DIAGNOSIS — R232 Flushing: Secondary | ICD-10-CM

## 2024-02-05 ENCOUNTER — Ambulatory Visit (HOSPITAL_BASED_OUTPATIENT_CLINIC_OR_DEPARTMENT_OTHER): Admitting: Obstetrics & Gynecology

## 2024-02-05 ENCOUNTER — Other Ambulatory Visit (HOSPITAL_BASED_OUTPATIENT_CLINIC_OR_DEPARTMENT_OTHER): Payer: Self-pay

## 2024-02-05 ENCOUNTER — Other Ambulatory Visit (HOSPITAL_BASED_OUTPATIENT_CLINIC_OR_DEPARTMENT_OTHER): Payer: Self-pay | Admitting: Obstetrics & Gynecology

## 2024-02-05 ENCOUNTER — Ambulatory Visit (INDEPENDENT_AMBULATORY_CARE_PROVIDER_SITE_OTHER): Admitting: Podiatry

## 2024-02-05 ENCOUNTER — Encounter (HOSPITAL_BASED_OUTPATIENT_CLINIC_OR_DEPARTMENT_OTHER): Payer: Self-pay | Admitting: Obstetrics & Gynecology

## 2024-02-05 VITALS — BP 111/71 | HR 64 | Ht 67.0 in | Wt 198.4 lb

## 2024-02-05 DIAGNOSIS — R232 Flushing: Secondary | ICD-10-CM

## 2024-02-05 DIAGNOSIS — I7300 Raynaud's syndrome without gangrene: Secondary | ICD-10-CM | POA: Diagnosis not present

## 2024-02-05 DIAGNOSIS — M779 Enthesopathy, unspecified: Secondary | ICD-10-CM

## 2024-02-05 DIAGNOSIS — N898 Other specified noninflammatory disorders of vagina: Secondary | ICD-10-CM | POA: Diagnosis not present

## 2024-02-05 DIAGNOSIS — Z79899 Other long term (current) drug therapy: Secondary | ICD-10-CM | POA: Diagnosis not present

## 2024-02-05 MED ORDER — VEOZAH 45 MG PO TABS: 45.0000 mg | ORAL_TABLET | Freq: Every day | ORAL | 0 refills | Status: AC

## 2024-02-05 NOTE — Progress Notes (Signed)
 GYNECOLOGY  VISIT  CC:   follow up vaginal bleeding  HPI: 73 y.o. G1P0010 Married Black or African American female here to discuss Veozah  and medication regimen.  Also, she needs vaginal exam today.  Has not had any more vaginal bleeding.  Thinks she's had an allergic reaction to the estradiol  cream as she's had itching.  Did stop Veozah  as well.  Itching improving.  Will restart the Veozah  and will have liver enzymes tested today.    Patient's last menstrual period was 03/19/1998.  Past Medical History:  Diagnosis Date   Allergic rhinitis    Allergy    Arthritis    Brain tumor (HCC)    Breast cyst 2007   Right   Cataract    surgery 07/2017   Clotting disorder    On plavix  since stents  for artery blockages   Coronary artery disease    Depression 2006   w/ menopause   Depression screening 09/27/2021   Family history of adverse reaction to anesthesia    sister PONV   Family history of breast cancer    Family history of colon cancer    Family history of kidney cancer 01/15/2018   Family history of lung cancer    Family history of pancreatic cancer    Family history of prostate cancer    Family history of uterine cancer    GERD (gastroesophageal reflux disease)    Glaucoma    Heart murmur    History of hysterectomy, supracervical    Hyperlipidemia    Hypertension    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   MLH1 c.[id number]_1410-1delinsCC (Splice site)   Multinodular goiter    PAD (peripheral artery disease)    LE Art US  1/18: Occluded prox-mid R SFA // ABIs 9/22: R 0.83; L 1.0   Pustular psoriasis    Tobacco user     MEDS:  Reviewed in EPIC  ALLERGIES: Sulfamethoxazole-trimethoprim, Estradiol , and Wellbutrin [bupropion hcl]  SH:  married, non smoker  Review of Systems  Constitutional: Negative.   Genitourinary: Negative.     PHYSICAL EXAMINATION:    BP 111/71 (BP Location: Left Arm, Patient Position: Sitting, Cuff Size: Large)   Pulse 64   Ht 5' 7 (1.702 m)   Wt 198  lb 6.4 oz (90 kg)   LMP 03/19/1998 Comment: h/o supracervical hysterectomy  SpO2 100%   BMI 31.07 kg/m     General appearance: alert, cooperative and appears stated age Lymph:  no inguinal LAD noted  Pelvic: External genitalia:  no lesions              Urethra:  normal appearing urethra with no masses, tenderness or lesions              Bartholins and Skenes: normal                 Vagina: granulation tissue has resolved, cuff well approximately.                Cervix: absent              Bimanual Exam:  Uterus:  uterus absent   Assessment/Plan: 1. Vaginal granulation tissue (Primary) - she has stopped the vaginal estradiol .  Advised not to restart.  Granulation tissue has resolved.  If she had future bleeding, advised to call  2. Hot flashes - restart Veozah  daily.    3. High risk medication use - Hepatic function panel.  Pt aware will need to recheck in one and two  months from now and then every 3 months for a year.  If liver enzymes increase at all, she will need to stop this medication.  Questions answered.

## 2024-02-06 ENCOUNTER — Ambulatory Visit (HOSPITAL_BASED_OUTPATIENT_CLINIC_OR_DEPARTMENT_OTHER): Payer: Self-pay | Admitting: Obstetrics & Gynecology

## 2024-02-06 DIAGNOSIS — Z79899 Other long term (current) drug therapy: Secondary | ICD-10-CM

## 2024-02-06 LAB — HEPATIC FUNCTION PANEL
ALT: 26 IU/L (ref 0–32)
AST: 39 IU/L (ref 0–40)
Albumin: 3.9 g/dL (ref 3.8–4.8)
Alkaline Phosphatase: 58 IU/L (ref 49–135)
Bilirubin Total: 0.8 mg/dL (ref 0.0–1.2)
Bilirubin, Direct: 0.31 mg/dL (ref 0.00–0.40)
Total Protein: 6.4 g/dL (ref 6.0–8.5)

## 2024-02-06 NOTE — Progress Notes (Signed)
 Subjective:   Patient ID: Pamela Lowe, female   DOB: 73 y.o.   MRN: [id number]   HPI Patient presents stating that she is concerned about discoloration in her digits and whether or not she may have nail disease.  She states that it has started recently and she was just concerned   ROS      Objective:  Physical Exam  Neurovascular status was found to be intact her toes are relatively cold and there is some irritation of the distal 2nd and 3rd digits and a small area on the plantar foot of redness localized no drainage and minimal discomfort currently     Assessment:  Probability for low level Raynaud's secondary to cold exposure     Plan:  H&P reviewed educated her on possibility of Raynaud's versus ingrown versus other pathology and we did discuss techniques to try to prevent from exposure to cold.  I did go ahead today advised on gradual warming with Epsom salt soaks and if any break in tissue were to occur I want to see back immediately I do think it will heal uneventfully

## 2024-02-10 ENCOUNTER — Other Ambulatory Visit (HOSPITAL_BASED_OUTPATIENT_CLINIC_OR_DEPARTMENT_OTHER): Payer: Self-pay | Admitting: Cardiovascular Disease

## 2024-02-10 ENCOUNTER — Encounter: Payer: Self-pay | Admitting: Pharmacist

## 2024-02-12 ENCOUNTER — Other Ambulatory Visit: Payer: Self-pay | Admitting: Pharmacist Clinician (PhC)/ Clinical Pharmacy Specialist

## 2024-02-12 DIAGNOSIS — N1831 Chronic kidney disease, stage 3a: Secondary | ICD-10-CM | POA: Diagnosis not present

## 2024-02-12 DIAGNOSIS — M069 Rheumatoid arthritis, unspecified: Secondary | ICD-10-CM | POA: Diagnosis not present

## 2024-02-12 DIAGNOSIS — I129 Hypertensive chronic kidney disease with stage 1 through stage 4 chronic kidney disease, or unspecified chronic kidney disease: Secondary | ICD-10-CM | POA: Diagnosis not present

## 2024-02-12 DIAGNOSIS — Z1509 Genetic susceptibility to other malignant neoplasm: Secondary | ICD-10-CM | POA: Diagnosis not present

## 2024-02-12 DIAGNOSIS — N2581 Secondary hyperparathyroidism of renal origin: Secondary | ICD-10-CM | POA: Diagnosis not present

## 2024-02-12 DIAGNOSIS — I25758 Atherosclerosis of native coronary artery of transplanted heart with other forms of angina pectoris: Secondary | ICD-10-CM

## 2024-02-12 DIAGNOSIS — Z15068 Genetic susceptibility to other malignant neoplasm of digestive system: Secondary | ICD-10-CM | POA: Diagnosis not present

## 2024-02-12 DIAGNOSIS — Z15060 Genetic susceptibility to colorectal cancer: Secondary | ICD-10-CM | POA: Diagnosis not present

## 2024-02-12 DIAGNOSIS — Z1507 Genetic susceptibility to malignant neoplasm of urinary tract: Secondary | ICD-10-CM | POA: Diagnosis not present

## 2024-02-12 DIAGNOSIS — I739 Peripheral vascular disease, unspecified: Secondary | ICD-10-CM

## 2024-02-12 MED ORDER — REPATHA SURECLICK 140 MG/ML ~~LOC~~ SOAJ: 140.0000 mg | SUBCUTANEOUS | 1 refills | Status: AC

## 2024-02-18 ENCOUNTER — Encounter (HOSPITAL_BASED_OUTPATIENT_CLINIC_OR_DEPARTMENT_OTHER): Payer: Self-pay

## 2024-02-18 ENCOUNTER — Encounter (HOSPITAL_BASED_OUTPATIENT_CLINIC_OR_DEPARTMENT_OTHER): Payer: Self-pay | Admitting: Obstetrics & Gynecology

## 2024-02-18 NOTE — Telephone Encounter (Signed)
 Spoke with patient and advised of results. Patient notes that she has been off of Veozah  to see if it helps clear up a rash/vaginal itching that she has been having. Advised patient to let us  know once she restarts the Veozah . Patient verbalized understanding and will send a MyChart message once she re-starts the medication.  Morna LOISE Quale, RN

## 2024-02-20 DIAGNOSIS — H209 Unspecified iridocyclitis: Secondary | ICD-10-CM | POA: Diagnosis not present

## 2024-02-20 DIAGNOSIS — Z79899 Other long term (current) drug therapy: Secondary | ICD-10-CM | POA: Diagnosis not present

## 2024-02-20 DIAGNOSIS — H4042X1 Glaucoma secondary to eye inflammation, left eye, mild stage: Secondary | ICD-10-CM | POA: Diagnosis not present

## 2024-02-20 DIAGNOSIS — H4041X3 Glaucoma secondary to eye inflammation, right eye, severe stage: Secondary | ICD-10-CM | POA: Diagnosis not present

## 2024-02-24 DIAGNOSIS — M0589 Other rheumatoid arthritis with rheumatoid factor of multiple sites: Secondary | ICD-10-CM | POA: Diagnosis not present

## 2024-03-03 ENCOUNTER — Other Ambulatory Visit: Payer: Self-pay | Admitting: Gastroenterology

## 2024-03-03 ENCOUNTER — Ambulatory Visit: Admitting: Gastroenterology

## 2024-03-03 ENCOUNTER — Encounter: Payer: Self-pay | Admitting: Gastroenterology

## 2024-03-03 VITALS — BP 110/72 | HR 69 | Ht 67.0 in | Wt 202.2 lb

## 2024-03-03 DIAGNOSIS — Z1507 Genetic susceptibility to malignant neoplasm of urinary tract: Secondary | ICD-10-CM

## 2024-03-03 DIAGNOSIS — K2100 Gastro-esophageal reflux disease with esophagitis, without bleeding: Secondary | ICD-10-CM

## 2024-03-03 DIAGNOSIS — Z1509 Genetic susceptibility to other malignant neoplasm: Secondary | ICD-10-CM

## 2024-03-03 DIAGNOSIS — Z15060 Genetic susceptibility to colorectal cancer: Secondary | ICD-10-CM

## 2024-03-03 DIAGNOSIS — Z15068 Genetic susceptibility to other malignant neoplasm of digestive system: Secondary | ICD-10-CM | POA: Diagnosis not present

## 2024-03-03 DIAGNOSIS — R140 Abdominal distension (gaseous): Secondary | ICD-10-CM | POA: Diagnosis not present

## 2024-03-03 NOTE — Patient Instructions (Addendum)
 You will be due for a recall colonoscopy in April 2026. We will send you a reminder in the mail when it gets closer to that time.  Please purchase the following medications over the counter and take as directed: Fiber Con  Send my chart message in 6-8 weeks with updates on symptoms.   Happy Holidays!  Thank you for choosing me and [facility].  Dr. Wilhelmenia  _______________________________________________________  If your blood pressure at your visit was 140/90 or greater, please contact your primary care physician to follow up on this.  _______________________________________________________  If you are age 72 or older, your body mass index should be between 23-30. Your Body mass index is 31.68 kg/m. If this is out of the aforementioned range listed, please consider follow up with your Primary Care Provider.  If you are age 72 or younger, your body mass index should be between 19-25. Your Body mass index is 31.68 kg/m. If this is out of the aformentioned range listed, please consider follow up with your Primary Care Provider.   ________________________________________________________  The [facility] providers would like to encourage you to use MYCHART to communicate with providers for non-urgent requests or questions.  Due to long hold times on the telephone, sending your provider a message by [facility] may be a faster and more efficient way to get a response.  Please allow 48 business hours for a response.  Please remember that this is for non-urgent requests.  _______________________________________________________  [facility] is using a team-based approach to care.  Your team is made up of your doctor and two to three APPS. Our APPS (Nurse Practitioners and Physician Assistants) work with your physician to ensure care continuity for you. They are fully qualified to address your health concerns and develop a treatment plan. They communicate directly with  your gastroenterologist to care for you. Seeing the Advanced Practice Practitioners on your physician's team can help you by facilitating care more promptly, often allowing for earlier appointments, access to diagnostic testing, procedures, and other specialty referrals.

## 2024-03-03 NOTE — Progress Notes (Signed)
 GASTROENTEROLOGY OUTPATIENT CLINIC VISIT   Primary Care Provider Levora Reyes SAUNDERS, MD [address] [place] [address] [phone]  Patient Profile: Pamela Lowe is a 73 y.o. female with a pmh significant for Lynch syndrome, previous brain tumor excision, PAD & Clotting disorder (on Plavix ), s/p hysterectomy, prior adrenal insufficiency, colon polyps (TA's), diverticulosis, GERD.  The patient presents to the [facility] for an evaluation and management of problem(s) noted below:  Problem List 1. Lynch syndrome   2. Gastroesophageal reflux disease with esophagitis without hemorrhage   3. Bloating    History of Present Illness Please see prior notes for full details of HPI.  Interval History The patient returns for follow-up today.  She is doing quite well at this time.  She continues to take famotidine at bedtime.  She does feel that the Prevacid  has been helpful.  She takes that once a day.  She is following lifestyle modifications at this time (including head of bed being elevated with her adjustable bed) and eating earlier in the evening.  She wonders if PPI should be continued.  She denies any dysphagia or odynophagia.  Her previous abdominal bloating has improved by transitioning from Metamucil to FiberCon.  No other changes in bowel habits.  GI Review of Systems Positive as above Negative for pain, bloating, melena, hematochezia   Review of Systems General: Denies fevers/chills/weight loss unintentionally Cardiovascular: Denies chest pain Pulmonary: Denies shortness of breath Gastroenterological: See HPI Genitourinary: Denies darkened urine  Hematological: Denies easy bruising/bleeding Dermatological: Denies juandice Psychological: Mood is stable   Medications Current Outpatient Medications  Medication Sig Dispense Refill   ANUCORT-HC  25 MG suppository Place 25 mg rectally 2 (two) times daily.     aspirin  81 MG EC tablet Take 81 mg  by mouth every evening.      B Complex Vitamins (VITAMIN B COMPLEX) TABS Take 1 tablet by mouth daily.     betamethasone , augmented, (DIPROLENE ) 0.05 % lotion Apply 1 Application topically daily as needed (psoriatic arthritis).     Biotin 1000 MCG tablet Take 1,000 mcg by mouth daily.     brimonidine  (ALPHAGAN ) 0.2 % ophthalmic solution Place 1 drop into both eyes 3 (three) times daily.      calcipotriene (DOVONOX) 0.005 % cream Apply 1 Application topically daily as needed (psoriatic arthritis).     clopidogrel  (PLAVIX ) 75 MG tablet Take 75 mg by mouth daily.     dorzolamide -timolol (COSOPT) 22.3-6.8 MG/ML ophthalmic solution Place 1 drop into both eyes 2 (two) times daily.     Evolocumab  (REPATHA  SURECLICK) 140 MG/ML SOAJ Inject 140 mg into the skin every 14 (fourteen) days. 6 mL 1   famotidine (PEPCID) 20 MG tablet Take 20 mg by mouth 2 (two) times daily.     Fezolinetant  (VEOZAH ) 45 MG TABS Take 1 tablet (45 mg total) by mouth daily. 90 tablet 0   folic acid  (FOLVITE ) 1 MG tablet Take 1 mg by mouth daily.      hydrochlorothiazide  (MICROZIDE ) 12.5 MG capsule TAKE 1 CAPSULE BY MOUTH DAILY 90 capsule 3   leflunomide (ARAVA) 20 MG tablet Take 20 mg by mouth daily.     metoprolol  succinate (TOPROL -XL) 25 MG 24 hr tablet TAKE 1 TABLET BY MOUTH DAILY 90 tablet 1   minoxidil (LONITEN) 2.5 MG tablet Take 2.5 mg by mouth daily.     Multiple Vitamins-Minerals (MULTIVITAMIN WITH MINERALS) tablet Take 1 tablet by mouth daily.     polycarbophil (FIBERCON)  625 MG tablet Take 625 mg by mouth daily.     rosuvastatin  (CRESTOR ) 20 MG tablet TAKE 1 TABLET BY MOUTH DAILY 90 tablet 3   SIMPONI  ARIA 50 MG/4ML SOLN injection Inject 50 mg into the vein every 8 (eight) weeks.     VYZULTA 0.024 % SOLN Place 1 drop into the right eye at bedtime.     lansoprazole  (PREVACID ) 30 MG capsule TAKE 1 CAPSULE(30 MG) BY MOUTH DAILY BEFORE BREAKFAST 30 capsule 5   No current facility-administered medications for this visit.     Allergies Allergies  Allergen Reactions   Estradiol  Rash    Itching with oral or topical or vaginal   Sulfamethoxazole-Trimethoprim Other (See Comments) and Rash    Chills/achy Flu like symptoms    Wellbutrin [Bupropion Hcl] Rash    Histories Past Medical History:  Diagnosis Date   Allergic rhinitis    Allergy    Arthritis    Brain tumor (HCC)    Breast cyst 2007   Right   Cataract    surgery 07/2017   Clotting disorder    On plavix  since stents  for artery blockages   Coronary artery disease    Depression 2006   w/ menopause   Depression screening 09/27/2021   Family history of adverse reaction to anesthesia    sister PONV   Family history of breast cancer    Family history of colon cancer    Family history of kidney cancer 01/15/2018   Family history of lung cancer    Family history of pancreatic cancer    Family history of prostate cancer    Family history of uterine cancer    GERD (gastroesophageal reflux disease)    Glaucoma    Heart murmur    History of hysterectomy, supracervical    Hyperlipidemia    Hypertension    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   MLH1 [id number] (Splice site)   Multinodular goiter    PAD (peripheral artery disease)    LE Art US  1/18: Occluded prox-mid R SFA // ABIs 9/22: R 0.83; L 1.0   Pustular psoriasis    Tobacco user    Past Surgical History:  Procedure Laterality Date   ABDOMINAL HYSTERECTOMY  1995   BIOPSY  01/15/2022   Procedure: BIOPSY;  Surgeon: Wilhelmenia Aloha Raddle., MD;  Location: [facility];  Service: Gastroenterology;;   BONE BIOPSY  06/24/2023   Procedure: BIOPSY;  Surgeon: Wilhelmenia Aloha Raddle., MD;  Location: [facility];  Service: Gastroenterology;;   BRAIN TUMOR EXCISION  02/24/2021   at [facility]   BREAST BIOPSY Bilateral 1985   CARDIAC CATHETERIZATION     CATARACT EXTRACTION Right 07/2017   COLONOSCOPY     COLONOSCOPY WITH PROPOFOL  N/A 01/15/2022   Procedure: COLONOSCOPY WITH PROPOFOL ;   Surgeon: Wilhelmenia Aloha Raddle., MD;  Location: [facility];  Service: Gastroenterology;  Laterality: N/A;   COLONOSCOPY WITH PROPOFOL  N/A 06/24/2023   Procedure: COLONOSCOPY WITH PROPOFOL ;  Surgeon: Wilhelmenia Aloha Raddle., MD;  Location: [facility];  Service: Gastroenterology;  Laterality: N/A;   CORONARY STENT INTERVENTION N/A 12/26/2018   Procedure: CORONARY STENT INTERVENTION;  Surgeon: Claudene Victory ORN, MD;  Location: [facility];  Service: Cardiovascular;  Laterality: N/A;   ESOPHAGOGASTRODUODENOSCOPY (EGD) WITH PROPOFOL  N/A 01/15/2022   Procedure: ESOPHAGOGASTRODUODENOSCOPY (EGD) WITH PROPOFOL ;  Surgeon: Wilhelmenia Aloha Raddle., MD;  Location: [facility];  Service: Gastroenterology;  Laterality: N/A;   ESOPHAGOGASTRODUODENOSCOPY (EGD) WITH PROPOFOL  N/A 06/24/2023   Procedure: ESOPHAGOGASTRODUODENOSCOPY (EGD) WITH PROPOFOL ;  Surgeon: Wilhelmenia,  Aloha Raddle., MD;  Location: [facility];  Service: Gastroenterology;  Laterality: N/A;   EYE SURGERY  2019, 2020   Cataracts, eye shunt implants   LEFT HEART CATH AND CORONARY ANGIOGRAPHY N/A 12/17/2018   Procedure: LEFT HEART CATH AND CORONARY ANGIOGRAPHY;  Surgeon: Claudene Victory ORN, MD;  Location: [facility];  Service: Cardiovascular;  Laterality: N/A;   POLYPECTOMY  01/15/2022   Procedure: POLYPECTOMY;  Surgeon: Wilhelmenia Aloha Raddle., MD;  Location: [facility];  Service: Gastroenterology;;   POLYPECTOMY  06/24/2023   Procedure: POLYPECTOMY, INTESTINE;  Surgeon: Wilhelmenia Aloha Raddle., MD;  Location: [facility];  Service: Gastroenterology;;   ROBOT ASSISTED TRACHELECTOMY N/A 09/18/2023   Procedure: TRACHELECTOMY, ROBOT ASSIST;  Surgeon: Viktoria Comer SAUNDERS, MD;  Location: [facility];  Service: Gynecology;  Laterality: N/A;  ROBOT ASSISTED TRACHELECTOMY   ROBOTIC ASSISTED BILATERAL SALPINGO OOPHERECTOMY Bilateral 09/18/2023   Procedure: SALPINGO-OOPHORECTOMY, BILATERAL, ROBOT-ASSISTED;  Surgeon: Viktoria Comer SAUNDERS, MD;   Location: [facility];  Service: Gynecology;  Laterality: Bilateral;   SUPRACERVICAL ABDOMINAL HYSTERECTOMY  1986   UPPER GASTROINTESTINAL ENDOSCOPY     Social History   Socioeconomic History   Marital status: Married    Spouse name: Lorrene   Number of children: 0   Years of education: Not on file   Highest education level: Master's degree (e.g., MA, MS, MEng, MEd, MSW, MBA)  Occupational History   Occupation: Special Ed Teacher    Comment: Dentist  Tobacco Use   Smoking status: Former    Current packs/day: 0.00    Average packs/day: 0.3 packs/day for 36.0 years (9.0 ttl pk-yrs)    Types: Cigarettes    Start date: 08/01/1981    Quit date: 08/01/2017    Years since quitting: 6.5   Smokeless tobacco: Never   Tobacco comments:    Started smoking at about 73 yrs old  Vaping Use   Vaping status: Never Used  Substance and Sexual Activity   Alcohol use: Not Currently   Drug use: No   Sexual activity: Not Currently    Partners: Male    Birth control/protection: Post-menopausal, Surgical    Comment: Hysterectomy  Other Topics Concern   Not on file  Social History Narrative   Not on file   Social Drivers of Health   Tobacco Use: Medium Risk (02/05/2024)   Patient History    Smoking Tobacco Use: Former    Smokeless Tobacco Use: Never    Passive Exposure: Not on Actuary Strain: Low Risk (01/03/2024)   Overall Financial Resource Strain (CARDIA)    Difficulty of Paying Living Expenses: Not very hard  Food Insecurity: No Food Insecurity (01/03/2024)   Epic    Worried About Programme Researcher, Broadcasting/film/video in the Last Year: Never true    Ran Out of Food in the Last Year: Never true  Transportation Needs: No Transportation Needs (01/03/2024)   Epic    Lack of Transportation (Medical): No    Lack of Transportation (Non-Medical): No  Physical Activity: Insufficiently Active (01/03/2024)   Exercise Vital Sign    Days of Exercise per Week: 4 days    Minutes of Exercise per  Session: 30 min  Stress: No Stress Concern Present (01/03/2024)   [employer] of Occupational Health - Occupational Stress Questionnaire    Feeling of Stress: Not at all  Social Connections: Unknown (01/03/2024)   Social Connection and Isolation Panel    Frequency of Communication with Friends and Family: More than three times a week  Frequency of Social Gatherings with Friends and Family: Not on file    Attends Religious Services: Not on file    Active Member of Clubs or Organizations: No    Attends Banker Meetings: Not on file    Marital Status: Married  Intimate Partner Violence: Not At Risk (09/03/2023)   Epic    Fear of Current or Ex-Partner: No    Emotionally Abused: No    Physically Abused: No    Sexually Abused: No  Depression (PHQ2-9): Low Risk (09/03/2023)   Depression (PHQ2-9)    PHQ-2 Score: 1  Alcohol Screen: Low Risk (09/03/2023)   Alcohol Screen    Last Alcohol Screening Score (AUDIT): 0  Housing: Low Risk (01/03/2024)   Epic    Unable to Pay for Housing in the Last Year: No    Number of Times Moved in the Last Year: 0    Homeless in the Last Year: No  Utilities: Not At Risk (09/03/2023)   Epic    Threatened with loss of utilities: No  Health Literacy: Adequate Health Literacy (09/03/2023)   B1300 Health Literacy    Frequency of need for help with medical instructions: Never   Family History  Problem Relation Age of Onset   Hypertension Mother    Deep vein thrombosis Mother    Alzheimer's disease Mother    Osteopenia Mother    Arthritis Mother    Varicose Veins Mother    Kidney cancer Father 26   Pneumonia Father    Hypertension Father    Arthritis Father    Early death Father    Kidney disease Father    Breast cancer Sister 11       recurrence 16   Cancer Sister    Breast cancer Sister 68       Stage 0   Cancer Sister    Breast cancer Sister 17       Stage 3, bilateral mastectomy   Cancer Sister    Early death Sister    Deep  vein thrombosis Sister            Allergies Brother    Early death Brother    Heart attack Brother 25   Breast cancer Maternal Aunt 67   Prostate cancer Maternal Uncle 68   Lung cancer Maternal Uncle 60   Pancreatic cancer Paternal Aunt 27   Pancreatic cancer Paternal Aunt 103   Brain cancer Paternal Aunt    Pancreatic cancer Paternal Uncle 40   Lung cancer Paternal Uncle 32   Heart attack Maternal Grandfather    Uterine cancer Paternal Grandmother 44   Colon cancer Paternal Grandfather 55   Kidney cancer Cousin 25   Kidney cancer Cousin 52   Uterine cancer Cousin 62   Prostate cancer Cousin 57   Breast cancer Cousin 60   Thyroid  disease Neg Hx    Rectal cancer Neg Hx    Stomach cancer Neg Hx    Esophageal cancer Neg Hx    Inflammatory bowel disease Neg Hx    Liver disease Neg Hx    I have reviewed her medical, social, and family history in detail and updated the electronic medical record as necessary.    PHYSICAL EXAMINATION  BP 110/72   Pulse 69   Ht 5' 7 (1.702 m)   Wt 202 lb 4 oz (91.7 kg)   LMP 03/19/1998 Comment: h/o supracervical hysterectomy  BMI 31.68 kg/m  Wt Readings from Last 3 Encounters:  03/03/24 202  lb 4 oz (91.7 kg)  02/05/24 198 lb 6.4 oz (90 kg)  01/09/24 195 lb (88.5 kg)  GEN: NAD, appears younger than stated age, doesn't appear chronically ill PSYCH: Cooperative, without pressured speech EYE: Conjunctivae pink, sclerae anicteric ENT: MMM CV: Nontachycardic RESP: No wheezing appreciated GI: NABS, soft, NT/ND, without rebound or guarding MSK/EXT: No LE edema SKIN: No jaundice NEURO:  Alert & Oriented x 3, no focal deficits   REVIEW OF DATA  I reviewed the following data at the time of this encounter:  GI Procedures and Studies  No new procedures to review  Laboratory Studies  Reviewed those in EPIC  Imaging Studies  No relevant studies   ASSESSMENT  Ms. Diniz is a 73 y.o. female.  The patient is seen today for evaluation and  management of:  1. Lynch syndrome   2. Gastroesophageal reflux disease with esophagitis without hemorrhage   3. Bloating    The patient is clinically and hemodynamically stable at this time.  Her GERD symptoms are well-controlled on PPI plus nightly H2 RA.  She would like to try to decrease her PPI use if possible and I think that is reasonable.  She will update us  in the next 6 to 8 weeks if she was able to come off the PPI completely or not.  I am happy that her abdominal bloating has improved with transition of her fiber to FiberCon.  Hopefully this continues to be the case.  From a Lynch syndrome standpoint, she will be due for colonoscopy next year and repeat upper endoscopy next year as well so we can follow-up her esophagitis.  If all looks well on her esophagus, then we will plan to push her out to every 2 to 4 years as per prior.  All patient questions were answered to the best of my ability, and the patient agrees to the aforementioned plan of action with follow-up as indicated.   PLAN  Pepcid 20 mg nightly Prevacid  30 mg daily Patient will trial off PPI for next few weeks and if doing well she will update us  via MyChart Continue FiberCon supplementation Colonoscopy 2026 for Lynch syndrome Upper endoscopy 2026 for Lynch syndrome and follow-up esophagitis   No orders of the defined types were placed in this encounter.   New Prescriptions   No medications on file   Modified Medications   No medications on file    Planned Follow Up No follow-ups on file.   Total Time in Face-to-Face and in Coordination of Care for patient including independent/personal interpretation/review of prior testing, medical history, examination, medication adjustment, communicating results with the patient directly, and documentation within the EHR is 30 minutes.   Aloha Finner, MD [facility] Advanced Endoscopy Office # [phone]

## 2024-03-06 ENCOUNTER — Ambulatory Visit: Admitting: Gastroenterology

## 2024-03-10 ENCOUNTER — Other Ambulatory Visit (HOSPITAL_BASED_OUTPATIENT_CLINIC_OR_DEPARTMENT_OTHER): Payer: Self-pay | Admitting: Cardiovascular Disease

## 2024-03-22 ENCOUNTER — Encounter (HOSPITAL_BASED_OUTPATIENT_CLINIC_OR_DEPARTMENT_OTHER): Payer: Self-pay | Admitting: Obstetrics & Gynecology

## 2024-03-26 ENCOUNTER — Telehealth: Payer: Self-pay | Admitting: Cardiovascular Disease

## 2024-03-26 NOTE — Telephone Encounter (Signed)
 Pt is calling into the office to inform Dr. Raford that over the last 2 days, her BP readings have been elevated outside of her baseline.  Pt states her baseline BP readings are usually in the 120s/80s and over the last 2 days they're coming back in the 140s systolic.   Pt states her BP reading from yesterday was 140/92 at her PCP appt and today it was 140/82.   Pt states her PCP made mention of her elevated BP reading at yesterdays visit, and advised her to keep a good check on this.    Pt did confirm with me that she is checking her BP after medication administration, around midday.   Pt is asymptomatic for the most part, but has had a mild headache over the last couple days.  No complaints of dizziness, blurred vision, chest pain, sob, or any other cardiac complaints.   Advised the pt to check her BP tomorrow about an hour after she takes her medication.  Advised her when she does check it, to make sure she has been resting for about 5-10 mins before taking the BP. Advised her to mychart or call us  with that reading thereafter.  Pt is aware I will go ahead and route this plan to Dr. Raford and Reche Finder, NP, and will certainly follow-up with her beforehand if they have any further recommendations.   Pt verbalized understanding and agrees with this plan.

## 2024-03-26 NOTE — Telephone Encounter (Signed)
 Pt c/o BP issue: STAT if pt c/o blurred vision, one-sided weakness or slurred speech.   1. What is your BP concern?  During doctor's appointment yesterday BP was high and she was advised to follow up with cardiology.  2. Have you taken any BP medication today? Yes. Would like to know if she needs to take anything extra over the weekend.   3. What are your last 5 BP readings? 1/07: 140/92 1/08: 140/82  4. Are you having any other symptoms (ex. Dizziness, headache, blurred vision, passed out)?  Slight headache

## 2024-04-30 ENCOUNTER — Ambulatory Visit: Admitting: Neurology

## 2024-06-30 ENCOUNTER — Ambulatory Visit (HOSPITAL_BASED_OUTPATIENT_CLINIC_OR_DEPARTMENT_OTHER): Admitting: Cardiovascular Disease

## 2024-07-09 ENCOUNTER — Ambulatory Visit: Admitting: Family Medicine

## 2024-09-08 ENCOUNTER — Encounter
